# Patient Record
Sex: Female | Born: 1935 | Race: White | Hispanic: No | Marital: Single | Smoking: Never smoker
Health system: Southern US, Community
[De-identification: ages and names within clinical notes are randomized; demographics above are authoritative.]

## PROBLEM LIST (undated history)

## (undated) DIAGNOSIS — D509 Iron deficiency anemia, unspecified: Secondary | ICD-10-CM

## (undated) DIAGNOSIS — M810 Age-related osteoporosis without current pathological fracture: Secondary | ICD-10-CM

## (undated) DIAGNOSIS — I4891 Unspecified atrial fibrillation: Secondary | ICD-10-CM

## (undated) DIAGNOSIS — H353230 Exudative age-related macular degeneration, bilateral, stage unspecified: Secondary | ICD-10-CM

## (undated) DIAGNOSIS — F32A Depression, unspecified: Secondary | ICD-10-CM

## (undated) DIAGNOSIS — E039 Hypothyroidism, unspecified: Secondary | ICD-10-CM

## (undated) DIAGNOSIS — I639 Cerebral infarction, unspecified: Secondary | ICD-10-CM

## (undated) DIAGNOSIS — Z950 Presence of cardiac pacemaker: Secondary | ICD-10-CM

## (undated) DIAGNOSIS — K5900 Constipation, unspecified: Secondary | ICD-10-CM

## (undated) DIAGNOSIS — K219 Gastro-esophageal reflux disease without esophagitis: Secondary | ICD-10-CM

## (undated) DIAGNOSIS — I10 Essential (primary) hypertension: Secondary | ICD-10-CM

## (undated) DIAGNOSIS — I452 Bifascicular block: Secondary | ICD-10-CM

## (undated) DIAGNOSIS — M1990 Unspecified osteoarthritis, unspecified site: Secondary | ICD-10-CM

## (undated) DIAGNOSIS — R011 Cardiac murmur, unspecified: Secondary | ICD-10-CM

## (undated) DIAGNOSIS — F329 Major depressive disorder, single episode, unspecified: Secondary | ICD-10-CM

## (undated) DIAGNOSIS — F419 Anxiety disorder, unspecified: Secondary | ICD-10-CM

## (undated) DIAGNOSIS — J189 Pneumonia, unspecified organism: Secondary | ICD-10-CM

## (undated) HISTORY — PX: APPENDECTOMY: SHX54

## (undated) HISTORY — PX: TOTAL ABDOMINAL HYSTERECTOMY: SHX209

## (undated) HISTORY — PX: CATARACT EXTRACTION W/ INTRAOCULAR LENS  IMPLANT, BILATERAL: SHX1307

## (undated) HISTORY — PX: JOINT REPLACEMENT: SHX530

## (undated) HISTORY — DX: Iron deficiency anemia, unspecified: D50.9

## (undated) HISTORY — PX: NASAL SEPTUM SURGERY: SHX37

## (undated) HISTORY — PX: CHOLECYSTECTOMY OPEN: SUR202

## (undated) HISTORY — PX: LAPAROSCOPIC OVARIAN CYSTECTOMY: SHX6248

## (undated) HISTORY — DX: Unspecified atrial fibrillation: I48.91

## (undated) HISTORY — PX: EYE SURGERY: SHX253

## (undated) HISTORY — DX: Essential (primary) hypertension: I10

## (undated) HISTORY — DX: Major depressive disorder, single episode, unspecified: F32.9

## (undated) HISTORY — PX: FRACTURE SURGERY: SHX138

## (undated) HISTORY — DX: Hypothyroidism, unspecified: E03.9

## (undated) HISTORY — DX: Gastro-esophageal reflux disease without esophagitis: K21.9

## (undated) HISTORY — DX: Age-related osteoporosis without current pathological fracture: M81.0

## (undated) HISTORY — DX: Anxiety disorder, unspecified: F41.9

## (undated) HISTORY — PX: COLONOSCOPY: SHX174

## (undated) HISTORY — PX: TONSILLECTOMY: SUR1361

## (undated) HISTORY — DX: Constipation, unspecified: K59.00

## (undated) HISTORY — DX: Depression, unspecified: F32.A

---

## 1998-01-02 ENCOUNTER — Encounter: Admission: RE | Admit: 1998-01-02 | Discharge: 1998-04-02 | Payer: Self-pay | Admitting: Neurosurgery

## 1998-05-14 ENCOUNTER — Encounter: Admission: RE | Admit: 1998-05-14 | Discharge: 1998-08-12 | Payer: Self-pay | Admitting: Internal Medicine

## 2000-07-20 HISTORY — PX: SVT ABLATION: EP1225

## 2000-09-22 ENCOUNTER — Ambulatory Visit (HOSPITAL_COMMUNITY): Admission: RE | Admit: 2000-09-22 | Discharge: 2000-09-23 | Payer: Self-pay | Admitting: Internal Medicine

## 2001-07-20 DIAGNOSIS — I639 Cerebral infarction, unspecified: Secondary | ICD-10-CM

## 2001-07-20 HISTORY — DX: Cerebral infarction, unspecified: I63.9

## 2001-12-02 ENCOUNTER — Encounter: Admission: RE | Admit: 2001-12-02 | Discharge: 2001-12-02 | Payer: Self-pay | Admitting: Internal Medicine

## 2001-12-02 ENCOUNTER — Encounter: Payer: Self-pay | Admitting: Internal Medicine

## 2001-12-23 ENCOUNTER — Encounter: Admission: RE | Admit: 2001-12-23 | Discharge: 2002-03-23 | Payer: Self-pay | Admitting: Neurosurgery

## 2002-01-23 ENCOUNTER — Encounter: Admission: RE | Admit: 2002-01-23 | Discharge: 2002-01-23 | Payer: Self-pay | Admitting: Neurosurgery

## 2002-01-23 ENCOUNTER — Encounter: Payer: Self-pay | Admitting: Neurosurgery

## 2002-06-08 ENCOUNTER — Encounter: Admission: RE | Admit: 2002-06-08 | Discharge: 2002-06-08 | Payer: Self-pay | Admitting: Internal Medicine

## 2002-06-08 ENCOUNTER — Encounter: Payer: Self-pay | Admitting: Internal Medicine

## 2002-06-24 ENCOUNTER — Inpatient Hospital Stay (HOSPITAL_COMMUNITY): Admission: EM | Admit: 2002-06-24 | Discharge: 2002-06-30 | Payer: Self-pay | Admitting: Emergency Medicine

## 2002-06-24 ENCOUNTER — Encounter: Payer: Self-pay | Admitting: Emergency Medicine

## 2002-06-25 ENCOUNTER — Encounter: Payer: Self-pay | Admitting: Internal Medicine

## 2002-06-26 ENCOUNTER — Encounter: Payer: Self-pay | Admitting: Cardiology

## 2003-01-10 ENCOUNTER — Encounter: Admission: RE | Admit: 2003-01-10 | Discharge: 2003-04-10 | Payer: Self-pay | Admitting: Internal Medicine

## 2003-09-07 ENCOUNTER — Encounter: Admission: RE | Admit: 2003-09-07 | Discharge: 2003-09-07 | Payer: Self-pay | Admitting: Internal Medicine

## 2004-04-02 ENCOUNTER — Encounter: Admission: RE | Admit: 2004-04-02 | Discharge: 2004-04-02 | Payer: Self-pay | Admitting: Internal Medicine

## 2004-06-06 ENCOUNTER — Ambulatory Visit: Payer: Self-pay | Admitting: Internal Medicine

## 2004-07-02 ENCOUNTER — Ambulatory Visit: Payer: Self-pay | Admitting: Internal Medicine

## 2004-07-23 ENCOUNTER — Ambulatory Visit: Payer: Self-pay | Admitting: Internal Medicine

## 2004-08-20 ENCOUNTER — Ambulatory Visit: Payer: Self-pay | Admitting: Internal Medicine

## 2004-09-18 ENCOUNTER — Ambulatory Visit: Payer: Self-pay | Admitting: Internal Medicine

## 2004-10-07 ENCOUNTER — Ambulatory Visit: Payer: Self-pay | Admitting: Internal Medicine

## 2004-10-28 ENCOUNTER — Ambulatory Visit: Payer: Self-pay | Admitting: Internal Medicine

## 2004-11-25 ENCOUNTER — Ambulatory Visit: Payer: Self-pay | Admitting: Internal Medicine

## 2004-12-26 ENCOUNTER — Ambulatory Visit: Payer: Self-pay | Admitting: Internal Medicine

## 2005-01-01 ENCOUNTER — Ambulatory Visit: Payer: Self-pay | Admitting: Internal Medicine

## 2005-01-08 ENCOUNTER — Ambulatory Visit: Payer: Self-pay | Admitting: Internal Medicine

## 2005-01-28 ENCOUNTER — Ambulatory Visit: Payer: Self-pay | Admitting: Internal Medicine

## 2005-02-27 ENCOUNTER — Ambulatory Visit: Payer: Self-pay | Admitting: Internal Medicine

## 2005-03-31 ENCOUNTER — Ambulatory Visit: Payer: Self-pay | Admitting: Internal Medicine

## 2005-04-17 ENCOUNTER — Ambulatory Visit: Payer: Self-pay | Admitting: Internal Medicine

## 2005-05-15 ENCOUNTER — Ambulatory Visit: Payer: Self-pay | Admitting: Internal Medicine

## 2005-06-18 ENCOUNTER — Ambulatory Visit: Payer: Self-pay | Admitting: Internal Medicine

## 2005-06-29 ENCOUNTER — Ambulatory Visit: Payer: Self-pay | Admitting: Internal Medicine

## 2005-07-04 ENCOUNTER — Encounter: Admission: RE | Admit: 2005-07-04 | Discharge: 2005-07-04 | Payer: Self-pay | Admitting: Internal Medicine

## 2005-07-29 ENCOUNTER — Ambulatory Visit: Payer: Self-pay | Admitting: Family Medicine

## 2005-07-31 ENCOUNTER — Ambulatory Visit: Payer: Self-pay | Admitting: Internal Medicine

## 2005-08-03 ENCOUNTER — Ambulatory Visit: Payer: Self-pay | Admitting: Internal Medicine

## 2005-08-18 ENCOUNTER — Ambulatory Visit: Payer: Self-pay | Admitting: Internal Medicine

## 2005-09-10 ENCOUNTER — Ambulatory Visit: Payer: Self-pay | Admitting: Internal Medicine

## 2005-09-24 ENCOUNTER — Ambulatory Visit: Payer: Self-pay | Admitting: Internal Medicine

## 2005-10-26 ENCOUNTER — Ambulatory Visit: Payer: Self-pay | Admitting: Internal Medicine

## 2005-11-25 ENCOUNTER — Ambulatory Visit: Payer: Self-pay | Admitting: Internal Medicine

## 2005-12-29 ENCOUNTER — Ambulatory Visit: Payer: Self-pay | Admitting: Internal Medicine

## 2006-01-29 ENCOUNTER — Ambulatory Visit: Payer: Self-pay | Admitting: Internal Medicine

## 2006-03-01 ENCOUNTER — Ambulatory Visit: Payer: Self-pay | Admitting: Internal Medicine

## 2006-04-01 ENCOUNTER — Ambulatory Visit: Payer: Self-pay | Admitting: Internal Medicine

## 2006-04-09 ENCOUNTER — Ambulatory Visit: Payer: Self-pay | Admitting: Internal Medicine

## 2006-04-14 ENCOUNTER — Ambulatory Visit: Payer: Self-pay | Admitting: Internal Medicine

## 2006-04-15 ENCOUNTER — Ambulatory Visit: Payer: Self-pay | Admitting: Cardiology

## 2006-04-30 ENCOUNTER — Ambulatory Visit: Payer: Self-pay | Admitting: Internal Medicine

## 2006-06-22 ENCOUNTER — Ambulatory Visit: Payer: Self-pay | Admitting: Internal Medicine

## 2006-07-20 HISTORY — PX: VITRECTOMY: SHX106

## 2006-07-23 ENCOUNTER — Ambulatory Visit: Payer: Self-pay | Admitting: Internal Medicine

## 2006-08-27 ENCOUNTER — Ambulatory Visit: Payer: Self-pay | Admitting: Internal Medicine

## 2006-09-21 ENCOUNTER — Ambulatory Visit: Payer: Self-pay | Admitting: Internal Medicine

## 2006-10-27 ENCOUNTER — Ambulatory Visit: Payer: Self-pay | Admitting: Internal Medicine

## 2006-11-11 ENCOUNTER — Ambulatory Visit: Payer: Self-pay | Admitting: Internal Medicine

## 2006-12-07 ENCOUNTER — Ambulatory Visit: Payer: Self-pay | Admitting: Internal Medicine

## 2007-01-07 ENCOUNTER — Ambulatory Visit: Payer: Self-pay | Admitting: Internal Medicine

## 2007-01-11 DIAGNOSIS — M1990 Unspecified osteoarthritis, unspecified site: Secondary | ICD-10-CM | POA: Insufficient documentation

## 2007-01-11 DIAGNOSIS — M810 Age-related osteoporosis without current pathological fracture: Secondary | ICD-10-CM

## 2007-01-11 DIAGNOSIS — F329 Major depressive disorder, single episode, unspecified: Secondary | ICD-10-CM

## 2007-01-11 DIAGNOSIS — F32A Depression, unspecified: Secondary | ICD-10-CM | POA: Insufficient documentation

## 2007-01-11 DIAGNOSIS — I4891 Unspecified atrial fibrillation: Secondary | ICD-10-CM

## 2007-01-11 DIAGNOSIS — K219 Gastro-esophageal reflux disease without esophagitis: Secondary | ICD-10-CM | POA: Insufficient documentation

## 2007-01-11 DIAGNOSIS — J309 Allergic rhinitis, unspecified: Secondary | ICD-10-CM | POA: Insufficient documentation

## 2007-01-11 HISTORY — DX: Age-related osteoporosis without current pathological fracture: M81.0

## 2007-01-31 ENCOUNTER — Ambulatory Visit (HOSPITAL_COMMUNITY): Admission: RE | Admit: 2007-01-31 | Discharge: 2007-01-31 | Payer: Self-pay | Admitting: Ophthalmology

## 2007-02-04 ENCOUNTER — Ambulatory Visit: Payer: Self-pay | Admitting: Internal Medicine

## 2007-03-04 ENCOUNTER — Ambulatory Visit: Payer: Self-pay | Admitting: Internal Medicine

## 2007-03-04 LAB — CONVERTED CEMR LAB: Prothrombin Time: 19.3 s — ABNORMAL HIGH (ref 10.0–14.0)

## 2007-03-09 ENCOUNTER — Telehealth: Payer: Self-pay | Admitting: *Deleted

## 2007-03-31 ENCOUNTER — Ambulatory Visit: Payer: Self-pay | Admitting: Internal Medicine

## 2007-03-31 DIAGNOSIS — R10813 Right lower quadrant abdominal tenderness: Secondary | ICD-10-CM

## 2007-03-31 LAB — CONVERTED CEMR LAB
AST: 19 units/L (ref 0–37)
Basophils Relative: 0.5 % (ref 0.0–1.0)
Bilirubin, Direct: 0.1 mg/dL (ref 0.0–0.3)
Eosinophils Absolute: 0.3 10*3/uL (ref 0.0–0.6)
Hemoglobin: 13.8 g/dL (ref 12.0–15.0)
Lymphocytes Relative: 22.9 % (ref 12.0–46.0)
MCV: 94.1 fL (ref 78.0–100.0)
Monocytes Relative: 7.5 % (ref 3.0–11.0)
Neutrophils Relative %: 64.1 % (ref 43.0–77.0)
RBC: 4.27 M/uL (ref 3.87–5.11)
RDW: 12 % (ref 11.5–14.6)
Total Bilirubin: 0.7 mg/dL (ref 0.3–1.2)
WBC: 6.5 10*3/uL (ref 4.5–10.5)

## 2007-04-08 ENCOUNTER — Telehealth (INDEPENDENT_AMBULATORY_CARE_PROVIDER_SITE_OTHER): Payer: Self-pay | Admitting: *Deleted

## 2007-04-29 ENCOUNTER — Ambulatory Visit: Payer: Self-pay | Admitting: Internal Medicine

## 2007-06-03 ENCOUNTER — Ambulatory Visit: Payer: Self-pay | Admitting: Internal Medicine

## 2007-07-06 ENCOUNTER — Ambulatory Visit: Payer: Self-pay | Admitting: Internal Medicine

## 2007-08-04 ENCOUNTER — Ambulatory Visit: Payer: Self-pay | Admitting: Internal Medicine

## 2007-08-04 DIAGNOSIS — K5900 Constipation, unspecified: Secondary | ICD-10-CM | POA: Insufficient documentation

## 2007-08-04 HISTORY — DX: Constipation, unspecified: K59.00

## 2007-09-05 ENCOUNTER — Ambulatory Visit: Payer: Self-pay | Admitting: Internal Medicine

## 2007-09-05 LAB — CONVERTED CEMR LAB: INR: 1.6

## 2007-10-07 ENCOUNTER — Ambulatory Visit: Payer: Self-pay | Admitting: Internal Medicine

## 2007-11-09 ENCOUNTER — Ambulatory Visit: Payer: Self-pay | Admitting: Internal Medicine

## 2007-11-15 ENCOUNTER — Telehealth: Payer: Self-pay | Admitting: Internal Medicine

## 2007-12-09 ENCOUNTER — Ambulatory Visit: Payer: Self-pay | Admitting: Internal Medicine

## 2007-12-09 LAB — CONVERTED CEMR LAB
INR: 2.1
Prothrombin Time: 17.7 s

## 2008-01-06 ENCOUNTER — Ambulatory Visit: Payer: Self-pay | Admitting: Internal Medicine

## 2008-01-17 ENCOUNTER — Telehealth: Payer: Self-pay | Admitting: Internal Medicine

## 2008-02-10 ENCOUNTER — Ambulatory Visit: Payer: Self-pay | Admitting: Internal Medicine

## 2008-02-10 DIAGNOSIS — L659 Nonscarring hair loss, unspecified: Secondary | ICD-10-CM | POA: Insufficient documentation

## 2008-02-10 LAB — CONVERTED CEMR LAB: TSH: 2.31 microintl units/mL (ref 0.35–5.50)

## 2008-03-22 ENCOUNTER — Ambulatory Visit: Payer: Self-pay | Admitting: Internal Medicine

## 2008-05-08 ENCOUNTER — Ambulatory Visit: Payer: Self-pay | Admitting: Internal Medicine

## 2008-05-08 LAB — CONVERTED CEMR LAB: INR: 2.1

## 2008-06-12 ENCOUNTER — Ambulatory Visit: Payer: Self-pay | Admitting: Internal Medicine

## 2008-06-12 LAB — CONVERTED CEMR LAB: INR: 1.3

## 2008-07-03 ENCOUNTER — Ambulatory Visit: Payer: Self-pay | Admitting: Internal Medicine

## 2008-07-03 LAB — CONVERTED CEMR LAB: INR: 2.1

## 2008-08-08 ENCOUNTER — Ambulatory Visit: Payer: Self-pay | Admitting: Internal Medicine

## 2008-08-08 DIAGNOSIS — M549 Dorsalgia, unspecified: Secondary | ICD-10-CM | POA: Insufficient documentation

## 2008-08-08 LAB — CONVERTED CEMR LAB: Prothrombin Time: 23.7 s

## 2008-09-06 ENCOUNTER — Ambulatory Visit: Payer: Self-pay | Admitting: Internal Medicine

## 2008-09-06 LAB — CONVERTED CEMR LAB
INR: 3.1
Prothrombin Time: 21.1 s

## 2008-10-04 ENCOUNTER — Ambulatory Visit: Payer: Self-pay | Admitting: Internal Medicine

## 2008-10-04 LAB — CONVERTED CEMR LAB
INR: 2.6
Prothrombin Time: 19.7 s

## 2008-11-07 ENCOUNTER — Ambulatory Visit: Payer: Self-pay | Admitting: Internal Medicine

## 2008-11-07 LAB — CONVERTED CEMR LAB: Prothrombin Time: 17.1 s

## 2008-12-18 ENCOUNTER — Ambulatory Visit: Payer: Self-pay | Admitting: Internal Medicine

## 2008-12-18 LAB — CONVERTED CEMR LAB: INR: 2.3

## 2009-01-15 ENCOUNTER — Ambulatory Visit: Payer: Self-pay | Admitting: Internal Medicine

## 2009-01-22 ENCOUNTER — Telehealth: Payer: Self-pay | Admitting: Internal Medicine

## 2009-02-15 ENCOUNTER — Ambulatory Visit: Payer: Self-pay | Admitting: Internal Medicine

## 2009-02-15 LAB — CONVERTED CEMR LAB: INR: 2.2

## 2009-03-04 ENCOUNTER — Ambulatory Visit: Payer: Self-pay | Admitting: Internal Medicine

## 2009-03-04 DIAGNOSIS — M353 Polymyalgia rheumatica: Secondary | ICD-10-CM | POA: Insufficient documentation

## 2009-03-04 LAB — CONVERTED CEMR LAB
CRP, High Sensitivity: 2 (ref 0.00–5.00)
Rhuematoid fact SerPl-aCnc: 20 intl units/mL (ref 0.0–20.0)

## 2009-03-15 ENCOUNTER — Ambulatory Visit: Payer: Self-pay | Admitting: Internal Medicine

## 2009-04-08 ENCOUNTER — Ambulatory Visit: Payer: Self-pay | Admitting: Internal Medicine

## 2009-05-06 ENCOUNTER — Ambulatory Visit: Payer: Self-pay | Admitting: Internal Medicine

## 2009-05-06 LAB — CONVERTED CEMR LAB
INR: 2.5
Prothrombin Time: 19.3 s

## 2009-06-05 ENCOUNTER — Ambulatory Visit: Payer: Self-pay | Admitting: Internal Medicine

## 2009-08-01 ENCOUNTER — Ambulatory Visit: Payer: Self-pay | Admitting: Internal Medicine

## 2009-08-01 LAB — CONVERTED CEMR LAB: Prothrombin Time: 16.8 s

## 2009-09-10 ENCOUNTER — Ambulatory Visit: Payer: Self-pay | Admitting: Internal Medicine

## 2009-09-10 LAB — CONVERTED CEMR LAB
Basophils Relative: 0.7 % (ref 0.0–3.0)
Bilirubin, Direct: 0.1 mg/dL (ref 0.0–0.3)
Hemoglobin: 12.2 g/dL (ref 12.0–15.0)
INR: 2.9
Lymphs Abs: 0.9 10*3/uL (ref 0.7–4.0)
MCHC: 33.4 g/dL (ref 30.0–36.0)
MCV: 98.2 fL (ref 78.0–100.0)
Monocytes Absolute: 0.4 10*3/uL (ref 0.1–1.0)
Monocytes Relative: 8.1 % (ref 3.0–12.0)
Neutrophils Relative %: 66.7 % (ref 43.0–77.0)
Platelets: 152 10*3/uL (ref 150.0–400.0)
RBC: 3.74 M/uL — ABNORMAL LOW (ref 3.87–5.11)
Sed Rate: 26 mm/hr — ABNORMAL HIGH (ref 0–22)
Total Bilirubin: 0.4 mg/dL (ref 0.3–1.2)
Vit D, 25-Hydroxy: 45 ng/mL (ref 30–89)
WBC: 5 10*3/uL (ref 4.5–10.5)

## 2009-10-08 ENCOUNTER — Ambulatory Visit: Payer: Self-pay | Admitting: Internal Medicine

## 2009-11-05 ENCOUNTER — Ambulatory Visit: Payer: Self-pay | Admitting: Internal Medicine

## 2009-11-05 LAB — CONVERTED CEMR LAB: INR: 1.9

## 2010-01-07 ENCOUNTER — Ambulatory Visit: Payer: Self-pay | Admitting: Internal Medicine

## 2010-01-07 LAB — CONVERTED CEMR LAB: INR: 1.6

## 2010-02-04 ENCOUNTER — Telehealth: Payer: Self-pay | Admitting: Internal Medicine

## 2010-02-04 ENCOUNTER — Ambulatory Visit: Payer: Self-pay | Admitting: Internal Medicine

## 2010-02-10 ENCOUNTER — Ambulatory Visit: Payer: Self-pay | Admitting: Family Medicine

## 2010-02-10 DIAGNOSIS — L255 Unspecified contact dermatitis due to plants, except food: Secondary | ICD-10-CM | POA: Insufficient documentation

## 2010-03-04 ENCOUNTER — Ambulatory Visit: Payer: Self-pay | Admitting: Internal Medicine

## 2010-04-03 ENCOUNTER — Ambulatory Visit: Payer: Self-pay | Admitting: Internal Medicine

## 2010-04-08 ENCOUNTER — Telehealth: Payer: Self-pay | Admitting: Internal Medicine

## 2010-05-01 ENCOUNTER — Ambulatory Visit: Payer: Self-pay | Admitting: Internal Medicine

## 2010-06-02 ENCOUNTER — Ambulatory Visit: Payer: Self-pay | Admitting: Internal Medicine

## 2010-07-02 ENCOUNTER — Ambulatory Visit: Payer: Self-pay | Admitting: Internal Medicine

## 2010-07-02 DIAGNOSIS — M19079 Primary osteoarthritis, unspecified ankle and foot: Secondary | ICD-10-CM | POA: Insufficient documentation

## 2010-07-02 LAB — CONVERTED CEMR LAB
Basophils Absolute: 0 10*3/uL (ref 0.0–0.1)
CRP, High Sensitivity: 3.8 (ref 0.00–5.00)
Eosinophils Absolute: 0.4 10*3/uL (ref 0.0–0.7)
Eosinophils Relative: 6.2 % — ABNORMAL HIGH (ref 0.0–5.0)
Hemoglobin: 12 g/dL (ref 12.0–15.0)
Lymphs Abs: 1.2 10*3/uL (ref 0.7–4.0)
MCHC: 34.3 g/dL (ref 30.0–36.0)
Monocytes Absolute: 0.6 10*3/uL (ref 0.1–1.0)
RBC: 3.64 M/uL — ABNORMAL LOW (ref 3.87–5.11)

## 2010-07-30 ENCOUNTER — Ambulatory Visit
Admission: RE | Admit: 2010-07-30 | Discharge: 2010-07-30 | Payer: Self-pay | Source: Home / Self Care | Attending: Internal Medicine | Admitting: Internal Medicine

## 2010-07-30 LAB — CONVERTED CEMR LAB: INR: 3.6

## 2010-08-02 DIAGNOSIS — I4891 Unspecified atrial fibrillation: Secondary | ICD-10-CM

## 2010-08-19 NOTE — Progress Notes (Signed)
Summary: Pt cx her orthopedic appt,because not network with her insurance  Phone Note Call from Patient Call back at [facility] Phone [phone]   Caller: Patient Summary of Call: Pt called and said that she cancelled her orthopedic appt with [facility] (Dr. Margaretha Sheffield) because they are not in network with her [facility].   Initial call taken by: Lucy Antigua,  February 04, 2010 1:46 PM  Follow-up for Phone Call        n medicare- pt informd and instructed to call her insurance compnay find out what is in network and we will be glad to make that appointment Follow-up by: Willy Eddy, LPN,  February 04, 2010 2:09 PM

## 2010-08-19 NOTE — Assessment & Plan Note (Signed)
Summary: 4 month fup//ccm   Vital Signs:  Patient profile:   77 year old female Height:      65 inches Weight:      131 pounds BMI:     21.88 Temp:     98.2 degrees F oral Pulse rate:   72 / minute Resp:     14 per minute BP sitting:   124 / 72  (left arm)  Vitals Entered By: Willy Eddy, LPN (January 07, 2010 1:21 PM) CC: roa   CC:  roa.  History of Present Illness: shoulder pain with reacing and cannot lay in that side can reach pack but feels discpomfort has not had any injections tylenol arthritis works for pain control hx of OA in back on coumadin for AF and needs a protime today with adjustment  Preventive Screening-Counseling & Management  Alcohol-Tobacco     Smoking Status: never  Problems Prior to Update: 1)  Rotator Cuff Syndrome, Left  (ICD-726.10) 2)  Polymyalgia Rheumatica  (ICD-725) 3)  Back Pain, Chronic  (ICD-724.5) 4)  Hair Loss  (ICD-704.00) 5)  Constipation, Intermittent  (ICD-564.00) 6)  Symptom, Tenderness, Abdominal Right Lwr Quad  (ICD-789.63) 7)  Aftercare, Long-term Use, Anticoagulants  (ICD-V58.61) 8)  Encounter For Therapeutic Drug Monitoring  (ICD-V58.83) 9)  Osteoporosis  (ICD-733.00) 10)  Depression  (ICD-311) 11)  Allergic Rhinitis  (ICD-477.9) 12)  Degenerative Joint Disease  (ICD-715.90) 13)  Gerd  (ICD-530.81) 14)  Atrial Fibrillation  (ICD-427.31)  Current Problems (verified): 1)  Polymyalgia Rheumatica  (ICD-725) 2)  Back Pain, Chronic  (ICD-724.5) 3)  Hair Loss  (ICD-704.00) 4)  Constipation, Intermittent  (ICD-564.00) 5)  Symptom, Tenderness, Abdominal Right Lwr Quad  (EAV-409.81) 6)  Aftercare, Long-term Use, Anticoagulants  (ICD-V58.61) 7)  Encounter For Therapeutic Drug Monitoring  (ICD-V58.83) 8)  Osteoporosis  (ICD-733.00) 9)  Depression  (ICD-311) 10)  Allergic Rhinitis  (ICD-477.9) 11)  Degenerative Joint Disease  (ICD-715.90) 12)  Gerd  (ICD-530.81) 13)  Atrial Fibrillation  (ICD-427.31)  Medications  Prior to Update: 1)  Atenolol 25 Mg  Tabs (Atenolol) .... Once Daily 2)  Lorazepam 1 Mg  Tabs (Lorazepam) .... 1/2 in Am and 1 At Bedtime 3)  Estrace 1 Mg Tabs (Estradiol) .Marland Kitchen.. 1 Once Daily 4)  Fish Oil   Caps (Omega-3 Fatty Acids Caps) .... Once Daily 5)  Warfarin Sodium 1 Mg Tabs (Warfarin Sodium) .... 3 Tabs Once Daily Exceot 5 On 7th Day 6)  Icaps Mv   Tabs (Multiple Vitamins-Minerals) .... Once Daily 7)  Kapidex 60 Mg  Cpdr (Dexlansoprazole) .Marland Kitchen.. 1 Once Daily 8)  Vitamin D 1000 Unit Caps (Cholecalciferol) .Marland Kitchen.. 1 Once Daily 9)  Etodolac 300 Mg Caps (Etodolac) .... One By Mouth Two Times A Day With Food 10)  Centrum Silver Ultra Womens  Tabs (Multiple Vitamins-Minerals) .Marland Kitchen.. 1 Once Daily  Current Medications (verified): 1)  Atenolol 25 Mg  Tabs (Atenolol) .... Once Daily 2)  Lorazepam 1 Mg  Tabs (Lorazepam) .... 1/2 in Am and 1 At Bedtime 3)  Estrace 1 Mg Tabs (Estradiol) .Marland Kitchen.. 1 Once Daily 4)  Fish Oil   Caps (Omega-3 Fatty Acids Caps) .... Once Daily 5)  Warfarin Sodium 1 Mg Tabs (Warfarin Sodium) .... 3 Tabs Once Daily 6)  Icaps Mv   Tabs (Multiple Vitamins-Minerals) .Marland Kitchen.. 1 Two Times A Day 7)  Kapidex 60 Mg  Cpdr (Dexlansoprazole) .Marland Kitchen.. 1 Once Daily 8)  Vitamin D 1000 Unit Caps (Cholecalciferol) .Marland Kitchen.. 1 Once Daily 9)  Etodolac  300 Mg Caps (Etodolac) .... One By Mouth Two Times A Day With Food 10)  Centrum Silver Ultra Womens  Tabs (Multiple Vitamins-Minerals) .Marland Kitchen.. 1 Once Daily  Allergies (verified): 1)  ! Pcn 2)  ! Allegra 3)  ! Celebrex (Celecoxib) 4)  Darvocet-N 100  Past History:  Family History: Last updated: 01/11/2007 Family History of Cardiovascular disorder  Social History: Last updated: 08/04/2007 Retired Married Never Smoked  Risk Factors: Smoking Status: never (01/07/2010)  Past medical, surgical, family and social histories (including risk factors) reviewed for relevance to current acute and chronic problems.  Past Medical History: Reviewed history from  01/11/2007 and no changes required. Atrial fibrillation GERD Allergic rhinitis Depression Osteoporosis Menopause  Past Surgical History: Reviewed history from 01/11/2007 and no changes required. TAH Cholecystectomy Hysterectomy  Family History: Reviewed history from 01/11/2007 and no changes required. Family History of Cardiovascular disorder  Social History: Reviewed history from 08/04/2007 and no changes required. Retired Married Never Smoked  Review of Systems       The patient complains of hoarseness and peripheral edema.  The patient denies anorexia, fever, weight loss, weight gain, vision loss, decreased hearing, chest pain, syncope, dyspnea on exertion, prolonged cough, headaches, hemoptysis, abdominal pain, melena, hematochezia, severe indigestion/heartburn, hematuria, incontinence, genital sores, muscle weakness, suspicious skin lesions, transient blindness, difficulty walking, depression, unusual weight change, abnormal bleeding, enlarged lymph nodes, angioedema, and breast masses.         arthritic pain  Physical Exam  General:  alert and well-hydrated.   Head:  normocephalic and no abnormalities observed.   Eyes:  pupils equal and pupils reactive to light.   Ears:  R ear normal and L ear normal.   Nose:  no external deformity and no nasal discharge.   Neck:  No deformities, masses, or tenderness noted. Lungs:  normal respiratory effort and no intercostal retractions.   Heart:  normal rate and regular rhythm.   Abdomen:  soft, no guarding, and no rigidity.   Msk:  decreased ROM and joint tenderness.   Extremities:  trace left pedal edema and trace right pedal edema.   Neurologic:  alert & oriented X3 and finger-to-nose normal.     Shoulder/Elbow Exam  General:    Well-developed, well-nourished, normal body habitus; no deformities, normal grooming.    Skin:    Intact, no scars, lesions, rashes, cafe au lait spots or bruising.    Inspection:     Inspection is normal.    Palpation:    tenderness R-AC:   Vascular:    Radial, ulnar, brachial, and axillary pulses 2+ and symmetric; capillary refill less than 2 seconds; no evidence of ischemia, clubbing, or cyanosis.     Impression & Recommendations:  Problem # 1:  ROTATOR CUFF SYNDROME, LEFT (ICD-726.10)  Informed consen obtained and then the left shoulderjoint was prepped in a sterile manor and 40 mg depo and 1/2 cc 1% lidocaine injected into the synovial space. After care discussed. Pt tolerated procedure well.  Orders: Joint Aspirate / Injection, Large (20610) Depo- Medrol 40mg  (J1030)  Problem # 2:  ATRIAL FIBRILLATION (ICD-427.31)  due protime today check and adjust as needed Her updated medication list for this problem includes:    Atenolol 25 Mg Tabs (Atenolol) ..... Once daily    Warfarin Sodium 1 Mg Tabs (Warfarin sodium) .Marland KitchenMarland KitchenMarland KitchenMarland Kitchen 3 tabs once daily  Orders: Fingerstick (81191) Protime ([id number])  Reviewed the following: PT: 17.1 (11/05/2009)   INR: 1.9 (11/05/2009) Next Protime: 4 weeks (dated on 11/05/2009)  Complete Medication List: 1)  Atenolol 25 Mg Tabs (Atenolol) .... Once daily 2)  Lorazepam 1 Mg Tabs (Lorazepam) .... 1/2 in am and 1 at bedtime 3)  Estrace 1 Mg Tabs (Estradiol) .Marland Kitchen.. 1 once daily 4)  Fish Oil Caps (Omega-3 fatty acids caps) .... Once daily 5)  Warfarin Sodium 1 Mg Tabs (Warfarin sodium) .... 3 tabs once daily 6)  Icaps Mv Tabs (Multiple vitamins-minerals) .Marland Kitchen.. 1 two times a day 7)  Kapidex 60 Mg Cpdr (Dexlansoprazole) .Marland Kitchen.. 1 once daily 8)  Vitamin D 1000 Unit Caps (Cholecalciferol) .Marland Kitchen.. 1 once daily 9)  Etodolac 300 Mg Caps (Etodolac) .... One by mouth two times a day with food 10)  Centrum Silver Ultra Womens Tabs (Multiple vitamins-minerals) .Marland Kitchen.. 1 once daily  Patient Instructions: 1)  Please schedule a follow-up appointment in 3 months.    Laboratory Results   Blood Tests   Date/Time Recieved: January 07, 2010 2:08  PM  Date/Time Reported: January 07, 2010 2:08 PM    INR: 1.6   (Normal Range: 0.88-1.12   Therap INR: 2.0-3.5) Comments: Wynona Canes, CMA  January 07, 2010 2:08 PM       ANTICOAGULATION RECORD PREVIOUS REGIMEN & LAB RESULTS Anticoagulation Diagnosis:  v58.83,v58.61,427.31 on  03/31/2007 Previous INR Goal Range:  2.0-3.0 on  01/06/2008 Previous INR:  1.9 on  11/05/2009 Previous Coumadin Dose(mg):  3mg  qd on  09/06/2008 Previous Regimen:  same on  02/15/2009 Previous Coagulation Comments:  hold one day the resume the 3 mg a day on  08/08/2008  NEW REGIMEN & LAB RESULTS Current INR: 1.6 Regimen: same  (no change)       Repeat testing in: 4 weeks MEDICATIONS ATENOLOL 25 MG  TABS (ATENOLOL) once daily LORAZEPAM 1 MG  TABS (LORAZEPAM) 1/2 in am and 1 at bedtime ESTRACE 1 MG TABS (ESTRADIOL) 1 once daily FISH OIL   CAPS (OMEGA-3 FATTY ACIDS CAPS) once daily WARFARIN SODIUM 1 MG TABS (WARFARIN SODIUM) 3 tabs once daily ICAPS MV   TABS (MULTIPLE VITAMINS-MINERALS) 1 two times a day KAPIDEX 60 MG  CPDR (DEXLANSOPRAZOLE) 1 once daily VITAMIN D 1000 UNIT CAPS (CHOLECALCIFEROL) 1 once daily ETODOLAC 300 MG CAPS (ETODOLAC) one by mouth two times a day with food CENTRUM SILVER ULTRA WOMENS  TABS (MULTIPLE VITAMINS-MINERALS) 1 once daily   Anticoagulation Visit Questionnaire      Coumadin dose missed/changed:  No      Abnormal Bleeding Symptoms:  No   Any diet changes including alcohol intake, vegetables or greens since the last visit:  No Any illnesses or hospitalizations since the last visit:  No Any signs of clotting since the last visit (including chest discomfort, dizziness, shortness of breath, arm tingling, slurred speech, swelling or redness in leg):  Yes

## 2010-08-19 NOTE — Progress Notes (Signed)
Summary: APPT SCHEDULED / INFO ONLY  Phone Note Call from Patient   Summary of Call: Dr Lovell Sheehan wanted pt set up for appt for evaluation of Rotator Cuff Syndrome (left).... Pt was scheduled with Eulah Pont / Alisa Graff... Pt will be seeing Dr Margaretha Sheffield on 7/20 at 10am.... Pt aware of same.  Initial call taken by: Debbra Riding,  February 04, 2010 10:37 AM

## 2010-08-19 NOTE — Assessment & Plan Note (Signed)
Summary: poison ivy on face/arms/legs and ankles/cjr   Vital Signs:  Patient profile:   77 year old female Weight:      138 pounds Temp:     97.5 degrees F oral BP sitting:   120 / 82  (left arm) Cuff size:   regular  Vitals Entered By: Kathrynn Speed CMA (February 10, 2010 1:54 PM) CC: poison ivy on face, legs & ankles x 5 days, src   History of Present Illness: Patient seen with pruritic slightly raised rash mostly arms and legs bilaterally. Onset last Thursday after doing a lot of yard work. [name] tried topical creams such as hydrocortisone without improvement. Denies any fever or chills. Symptoms especially severe at night  Current Medications (verified): 1)  Atenolol 25 Mg  Tabs (Atenolol) .... Once Daily 2)  Lorazepam 1 Mg  Tabs (Lorazepam) .... 1/2 in Am and 1 At Bedtime 3)  Estrace 1 Mg Tabs (Estradiol) .Marland Kitchen.. 1 Once Daily 4)  Fish Oil   Caps (Omega-3 Fatty Acids Caps) .... Once Daily 5)  Warfarin Sodium 1 Mg Tabs (Warfarin Sodium) .... 3 Tabs Once Daily 6)  Icaps Mv   Tabs (Multiple Vitamins-Minerals) .Marland Kitchen.. 1 Two Times A Day 7)  Kapidex 60 Mg  Cpdr (Dexlansoprazole) .Marland Kitchen.. 1 Once Daily 8)  Vitamin D 1000 Unit Caps (Cholecalciferol) .Marland Kitchen.. 1 Once Daily 9)  Etodolac 300 Mg Caps (Etodolac) .... One By Mouth Two Times A Day With Food 10)  Centrum Silver Ultra Womens  Tabs (Multiple Vitamins-Minerals) .Marland Kitchen.. 1 Once Daily  Allergies (verified): 1)  ! Pcn 2)  ! Allegra 3)  ! Celebrex (Celecoxib) 4)  Darvocet-N 100  Past History:  Past Medical History: Last updated: 01/11/2007 Atrial fibrillation GERD Allergic rhinitis Depression Osteoporosis Menopause  Physical Exam  General:  Well-developed,well-nourished,in no acute distress; alert,appropriate and cooperative throughout examination Skin:  patient has a rash which is erythematous slightly vesicular in places with linear distribution in several areas with scattered patches both legs and forearms minimal left facial involvement.   Nontender.   Impression & Recommendations:  Problem # 1:  RHUS DERMATITIS (ICD-692.6) discussed options.  Reviewed possible side effects of steroids. Her updated medication list for this problem includes:    Prednisone 10 Mg Tabs (Prednisone) .Marland Kitchen... Taper as follows:  6-5-4-4-4-3-3-2-2-1-1  Complete Medication List: 1)  Atenolol 25 Mg Tabs (Atenolol) .... Once daily 2)  Lorazepam 1 Mg Tabs (Lorazepam) .... 1/2 in am and 1 at bedtime 3)  Estrace 1 Mg Tabs (Estradiol) .Marland Kitchen.. 1 once daily 4)  Fish Oil Caps (Omega-3 fatty acids caps) .... Once daily 5)  Warfarin Sodium 1 Mg Tabs (Warfarin sodium) .... 3 tabs once daily 6)  Icaps Mv Tabs (Multiple vitamins-minerals) .Marland Kitchen.. 1 two times a day 7)  Kapidex 60 Mg Cpdr (Dexlansoprazole) .Marland Kitchen.. 1 once daily 8)  Vitamin D 1000 Unit Caps (Cholecalciferol) .Marland Kitchen.. 1 once daily 9)  Etodolac 300 Mg Caps (Etodolac) .... One by mouth two times a day with food 10)  Centrum Silver Ultra Womens Tabs (Multiple vitamins-minerals) .Marland Kitchen.. 1 once daily 11)  Prednisone 10 Mg Tabs (Prednisone) .... Taper as follows:  6-5-4-4-4-3-3-2-2-1-1  Patient Instructions: 1)  Hold Etodolac while taking prednisone Prescriptions: PREDNISONE 10 MG TABS (PREDNISONE) taper as follows:  6-5-4-4-4-3-3-2-2-1-1  #35 x 0   Entered and Authorized by:   Evelena Peat MD   Signed by:   Evelena Peat MD on 02/10/2010   Method used:   Electronically to  [facility]  [phone]* (retail)       [address] [place], [place]  [address]       Ph: [phone] or [phone]       Fax: [phone]   RxID:   [id number]

## 2010-08-19 NOTE — Progress Notes (Signed)
Summary: REFILL REQUEST  Phone Note Refill Request Message from:  Patient on April 08, 2010 8:27 AM  Refills Requested: Medication #1:  ETODOLAC 300 MG CAPS one by mouth two times a day with food   Notes: [facility].    Initial call taken by: Debbra Riding,  April 08, 2010 8:28 AM    Prescriptions: ETODOLAC 300 MG CAPS (ETODOLAC) one by mouth two times a day with food  #60.0 Capsule x 1   Entered by:   Willy Eddy, LPN   Authorized by:   Stacie Glaze MD   Signed by:   Willy Eddy, LPN on 40/98/1191   Method used:   Electronically to        [facility]  [phone]* (retail)       [address] [place], [place]  [address]       Ph: [phone] or [phone]       Fax: [phone]   RxID:   [id number]   Appended Document: REFILL REQUEST this was ok'd per dr Lovell Sheehan

## 2010-08-19 NOTE — Assessment & Plan Note (Signed)
Summary: pt/njr   Nurse Visit   Allergies: 1)  ! Pcn 2)  ! Allegra 3)  ! Celebrex (Celecoxib) 4)  Darvocet-N 100 Laboratory Results   Blood Tests      INR: 1.9   (Normal Range: 0.88-1.12   Therap INR: 2.0-3.5) Comments: Rita Ohara  May 01, 2010 2:16 PM     Orders Added: 1)  Est. Patient Level I [99211] 2)  Protime [[id number]]   ANTICOAGULATION RECORD PREVIOUS REGIMEN & LAB RESULTS Anticoagulation Diagnosis:  v58.83,v58.61,427.31 on  03/31/2007 Previous INR Goal Range:  2.0-3.0 on  01/06/2008 Previous INR:  1.8 on  04/03/2010 Previous Coumadin Dose(mg):  3mg  qd on  09/06/2008 Previous Regimen:  same on  03/04/2010 Previous Coagulation Comments:  OV on  04/03/2010  NEW REGIMEN & LAB RESULTS Current INR: 1.9 Regimen: same Coagulation Comments: Dr. Caryl Never approved Repeat testing in: 4 weeks  Anticoagulation Visit Questionnaire Coumadin dose missed/changed:  No Abnormal Bleeding Symptoms:  No  Any diet changes including alcohol intake, vegetables or greens since the last visit:  No Any illnesses or hospitalizations since the last visit:  No Any signs of clotting since the last visit (including chest discomfort, dizziness, shortness of breath, arm tingling, slurred speech, swelling or redness in leg):  No  MEDICATIONS ATENOLOL 25 MG  TABS (ATENOLOL) once daily LORAZEPAM 1 MG  TABS (LORAZEPAM) 1/2 in am and 1 at bedtime ESTRACE 1 MG TABS (ESTRADIOL) 1 once daily WARFARIN SODIUM 1 MG TABS (WARFARIN SODIUM) 3 tabs once daily ICAPS MV   TABS (MULTIPLE VITAMINS-MINERALS) 1 two times a day KAPIDEX 60 MG  CPDR (DEXLANSOPRAZOLE) 1 once daily VITAMIN D 1000 UNIT CAPS (CHOLECALCIFEROL) 1 once daily ETODOLAC 300 MG CAPS (ETODOLAC) one by mouth two times a day with food CENTRUM SILVER ULTRA WOMENS  TABS (MULTIPLE VITAMINS-MINERALS) 1 once daily

## 2010-08-19 NOTE — Assessment & Plan Note (Signed)
Summary: pt/njr   Nurse Visit   Allergies: 1)  ! Pcn 2)  ! Allegra 3)  ! Celebrex (Celecoxib) 4)  Darvocet-N 100 (Propoxyphene N-Apap) Laboratory Results   Blood Tests     PT: 16.8 s   (Normal Range: 10.6-13.4)  INR: 1.9   (Normal Range: 0.88-1.12   Therap INR: 2.0-3.5) Comments: Joanne Chars CMA  August 01, 2009 2:27 PM     Orders Added: 1)  Est. Patient Level I [99211] 2)  Protime [[id number]]   ANTICOAGULATION RECORD PREVIOUS REGIMEN & LAB RESULTS Anticoagulation Diagnosis:  v58.83,v58.61,427.31 on  03/31/2007 Previous INR Goal Range:  2.0-3.0 on  01/06/2008 Previous INR:  2.8 on  06/05/2009 Previous Coumadin Dose(mg):  3mg  qd on  09/06/2008 Previous Regimen:  same on  02/15/2009 Previous Coagulation Comments:  hold one day the resume the 3 mg a day on  08/08/2008  NEW REGIMEN & LAB RESULTS Current INR: 1.9 Regimen: same  (no change)   Anticoagulation Visit Questionnaire Coumadin dose missed/changed:  No Abnormal Bleeding Symptoms:  No  Any diet changes including alcohol intake, vegetables or greens since the last visit:  No Any illnesses or hospitalizations since the last visit:  No Any signs of clotting since the last visit (including chest discomfort, dizziness, shortness of breath, arm tingling, slurred speech, swelling or redness in leg):  No  MEDICATIONS ATENOLOL 25 MG  TABS (ATENOLOL) once daily LORAZEPAM 1 MG  TABS (LORAZEPAM) 1/2 in am and 1 at bedtime ESTRACE 1 MG TABS (ESTRADIOL) 1 once daily FISH OIL   CAPS (OMEGA-3 FATTY ACIDS CAPS) once daily WARFARIN SODIUM 1 MG TABS (WARFARIN SODIUM) 3 tabs once daily exceot 5 on 7th day ICAPS MV   TABS (MULTIPLE VITAMINS-MINERALS) once daily KAPIDEX 60 MG  CPDR (DEXLANSOPRAZOLE) 1 once daily VITAMIN D 1000 UNIT CAPS (CHOLECALCIFEROL) 1 once daily ETODOLAC 300 MG CAPS (ETODOLAC) one by mouth two times a day with food

## 2010-08-19 NOTE — Assessment & Plan Note (Signed)
Summary: protime/ccm   Nurse Visit   Allergies: 1)  ! Pcn 2)  ! Allegra 3)  ! Celebrex (Celecoxib) 4)  Darvocet-N 100 Laboratory Results   Blood Tests   Date/Time Received: October 08, 2009 2:23 PM  Date/Time Reported: October 08, 2009 2:23 PM   PT: 18.4 s   (Normal Range: 10.6-13.4)  INR: 2.3   (Normal Range: 0.88-1.12   Therap INR: 2.0-3.5) Comments: Wynona Canes, CMA  October 08, 2009 2:23 PM     Orders Added: 1)  Est. Patient Level I [99211] 2)  Protime [[id number]]  Laboratory Results   Blood Tests     PT: 18.4 s   (Normal Range: 10.6-13.4)  INR: 2.3   (Normal Range: 0.88-1.12   Therap INR: 2.0-3.5) Comments: Wynona Canes, CMA  October 08, 2009 2:23 PM       ANTICOAGULATION RECORD PREVIOUS REGIMEN & LAB RESULTS Anticoagulation Diagnosis:  v58.83,v58.61,427.31 on  03/31/2007 Previous INR Goal Range:  2.0-3.0 on  01/06/2008 Previous INR:  2.9 on  09/10/2009 Previous Coumadin Dose(mg):  3mg  qd on  09/06/2008 Previous Regimen:  same on  02/15/2009 Previous Coagulation Comments:  hold one day the resume the 3 mg a day on  08/08/2008  NEW REGIMEN & LAB RESULTS Current INR: 2.3 Regimen: same  (no change)       Repeat testing in: 4 weeks MEDICATIONS ATENOLOL 25 MG  TABS (ATENOLOL) once daily LORAZEPAM 1 MG  TABS (LORAZEPAM) 1/2 in am and 1 at bedtime ESTRACE 1 MG TABS (ESTRADIOL) 1 once daily FISH OIL   CAPS (OMEGA-3 FATTY ACIDS CAPS) once daily WARFARIN SODIUM 1 MG TABS (WARFARIN SODIUM) 3 tabs once daily exceot 5 on 7th day ICAPS MV   TABS (MULTIPLE VITAMINS-MINERALS) once daily KAPIDEX 60 MG  CPDR (DEXLANSOPRAZOLE) 1 once daily VITAMIN D 1000 UNIT CAPS (CHOLECALCIFEROL) 1 once daily ETODOLAC 300 MG CAPS (ETODOLAC) one by mouth two times a day with food CENTRUM SILVER ULTRA WOMENS  TABS (MULTIPLE VITAMINS-MINERALS) 1 once daily   Anticoagulation Visit Questionnaire      Coumadin dose missed/changed:  No      Abnormal Bleeding Symptoms:  No   Any  diet changes including alcohol intake, vegetables or greens since the last visit:  No Any illnesses or hospitalizations since the last visit:  No Any signs of clotting since the last visit (including chest discomfort, dizziness, shortness of breath, arm tingling, slurred speech, swelling or redness in leg):  No

## 2010-08-19 NOTE — Assessment & Plan Note (Signed)
Summary: 3 month follow up/cjr   Vital Signs:  Patient profile:   77 year old female Height:      65 inches Weight:      138 pounds BMI:     23.05 Temp:     98.2 degrees F oral Pulse rate:   68 / minute Pulse rhythm:   regular Resp:     14 per minute BP sitting:   124 / 78  (left arm)  Vitals Entered By: Willy Eddy, LPN (April 03, 2010 10:46 AM) CC: roa-never saw ortho for rotator cuff due to no one in her network in area Is Patient Diabetic? No   Primary Care Provider:  Stacie Glaze MD  CC:  roa-never saw ortho for rotator cuff due to no one in her network in area.  History of Present Illness: has never had shingles and has a hx of chicken pox she is interested in the shot her GERD is controlled with dexilant back pain is persistant  but not increased ankle swell daily and "turn blue" she did not go to the the orthopedist for the rotator cuff  Preventive Screening-Counseling & Management  Alcohol-Tobacco     Smoking Status: never  Current Problems (verified): 1)  Rhus Dermatitis  (ICD-692.6) 2)  Rotator Cuff Syndrome, Left  (ICD-726.10) 3)  Polymyalgia Rheumatica  (ICD-725) 4)  Back Pain, Chronic  (ICD-724.5) 5)  Hair Loss  (ICD-704.00) 6)  Constipation, Intermittent  (ICD-564.00) 7)  Symptom, Tenderness, Abdominal Right Lwr Quad  (ICD-789.63) 8)  Aftercare, Long-term Use, Anticoagulants  (ICD-V58.61) 9)  Encounter For Therapeutic Drug Monitoring  (ICD-V58.83) 10)  Osteoporosis  (ICD-733.00) 11)  Depression  (ICD-311) 12)  Allergic Rhinitis  (ICD-477.9) 13)  Degenerative Joint Disease  (ICD-715.90) 14)  Gerd  (ICD-530.81) 15)  Atrial Fibrillation  (ICD-427.31)  Current Medications (verified): 1)  Atenolol 25 Mg  Tabs (Atenolol) .... Once Daily 2)  Lorazepam 1 Mg  Tabs (Lorazepam) .... 1/2 in Am and 1 At Bedtime 3)  Estrace 1 Mg Tabs (Estradiol) .Marland Kitchen.. 1 Once Daily 4)  Fish Oil   Caps (Omega-3 Fatty Acids Caps) .... Once Daily 5)  Warfarin Sodium  1 Mg Tabs (Warfarin Sodium) .... 3 Tabs Once Daily 6)  Icaps Mv   Tabs (Multiple Vitamins-Minerals) .Marland Kitchen.. 1 Two Times A Day 7)  Kapidex 60 Mg  Cpdr (Dexlansoprazole) .Marland Kitchen.. 1 Once Daily 8)  Vitamin D 1000 Unit Caps (Cholecalciferol) .Marland Kitchen.. 1 Once Daily 9)  Etodolac 300 Mg Caps (Etodolac) .... One By Mouth Two Times A Day With Food 10)  Centrum Silver Ultra Womens  Tabs (Multiple Vitamins-Minerals) .Marland Kitchen.. 1 Once Daily  Allergies (verified): 1)  ! Pcn 2)  ! Allegra 3)  ! Celebrex (Celecoxib) 4)  Darvocet-N 100  Past History:  Family History: Last updated: 01/11/2007 Family History of Cardiovascular disorder  Social History: Last updated: 08/04/2007 Retired Married Never Smoked  Risk Factors: Smoking Status: never (04/03/2010)  Past medical, surgical, family and social histories (including risk factors) reviewed, and no changes noted (except as noted below).  Past Medical History: Reviewed history from 01/11/2007 and no changes required. Atrial fibrillation GERD Allergic rhinitis Depression Osteoporosis Menopause  Past Surgical History: Reviewed history from 01/11/2007 and no changes required. TAH Cholecystectomy Hysterectomy  Family History: Reviewed history from 01/11/2007 and no changes required. Family History of Cardiovascular disorder  Social History: Reviewed history from 08/04/2007 and no changes required. Retired Married Never Smoked  Review of Systems  Flu Vaccine Consent Questions     Do you have a history of severe allergic reactions to this vaccine? no    Any prior history of allergic reactions to egg and/or gelatin? no    Do you have a sensitivity to the preservative Thimersol? no    Do you have a past history of Guillan-Barre Syndrome? no    Do you currently have an acute febrile illness? no    Have you ever had a severe reaction to latex? no    Vaccine information given and explained to patient? yes    Are you currently pregnant? no    Lot  Number:[id number]   Exp Date:01/17/2011   Site Given  Left Deltoid IM   Physical Exam  General:  Well-developed,well-nourished,in no acute distress; alert,appropriate and cooperative throughout examination Head:  normocephalic and no abnormalities observed.   Eyes:  pupils equal and pupils reactive to light.   Ears:  R ear normal and L ear normal.   Nose:  no external deformity and no nasal discharge.   Mouth:  fair dentition, posterior lymphoid hypertrophy, and postnasal drip.   Neck:  No deformities, masses, or tenderness noted. Lungs:  normal respiratory effort and no intercostal retractions.   Heart:  normal rate and regular rhythm.   Abdomen:  soft, no guarding, and no rigidity.   Msk:  right biceps mass suggestive of torn head of the biceps and mucle spasm mild triceps tendernes and less tendernss in the rotor cuuf Extremities:  No clubbing, cyanosis, edema, or deformity noted with normal full range of motion of all joints.   Neurologic:  alert & oriented X3 and finger-to-nose normal.     Impression & Recommendations:  Problem # 1:  ATRIAL FIBRILLATION (ICD-427.31) monitering of coumadin and discussion of pradax and costs and risks Her updated medication list for this problem includes:    Atenolol 25 Mg Tabs (Atenolol) ..... Once daily    Warfarin Sodium 1 Mg Tabs (Warfarin sodium) .Marland KitchenMarland KitchenMarland KitchenMarland Kitchen 3 tabs once daily  Orders: Protime ([id number]) Fingerstick ([id number])  Reviewed the following: PT: 17.1 (11/05/2009)   INR: 1.8 (04/03/2010) Next Protime: 4 weeks (dated on 03/04/2010)  Problem # 2:  BACK PAIN, CHRONIC (ICD-724.5)  Her updated medication list for this problem includes:    Etodolac 300 Mg Caps (Etodolac) ..... One by mouth two times a day with food  Discussed use of moist heat or ice, modified activities, medications, and stretching/strengthening exercises. Back care instructions given. To be seen in 2 weeks if no improvement; sooner if worsening of symptoms.   Problem #  3:  DEPRESSION (ICD-311)  Her updated medication list for this problem includes:    Lorazepam 1 Mg Tabs (Lorazepam) .Marland Kitchen... 1/2 in am and 1 at bedtime    Discussed treatment options, including trial of antidpressant medication. Will refer to behavioral health. Follow-up call in in 24-48 hours and recheck in 2 weeks, sooner as needed. Patient agrees to call if any worsening of symptoms or thoughts of doing harm arise. Verified that the patient has no suicidal ideation at this time.   Problem # 4:  OSTEOPOROSIS (ICD-733.00)  Discussed medication use, applications of heat or ice, and exercises.   Complete Medication List: 1)  Atenolol 25 Mg Tabs (Atenolol) .... Once daily 2)  Lorazepam 1 Mg Tabs (Lorazepam) .... 1/2 in am and 1 at bedtime 3)  Estrace 1 Mg Tabs (Estradiol) .Marland Kitchen.. 1 once daily 4)  Warfarin Sodium 1 Mg Tabs (Warfarin sodium) .... 3 tabs  once daily 5)  Icaps Mv Tabs (Multiple vitamins-minerals) .Marland Kitchen.. 1 two times a day 6)  Kapidex 60 Mg Cpdr (Dexlansoprazole) .Marland Kitchen.. 1 once daily 7)  Vitamin D 1000 Unit Caps (Cholecalciferol) .Marland Kitchen.. 1 once daily 8)  Etodolac 300 Mg Caps (Etodolac) .... One by mouth two times a day with food 9)  Centrum Silver Ultra Womens Tabs (Multiple vitamins-minerals) .Marland Kitchen.. 1 once daily  Other Orders: Flu Vaccine 44yrs + MEDICARE PATIENTS (N8295) Administration Flu vaccine - MCR (A2130)  Patient Instructions: 1)  Please schedule a follow-up appointment in 3 months.  Laboratory Results   Blood Tests   Date/Time Recieved: April 03, 2010 10:13 AM  Date/Time Reported: April 03, 2010 10:13 AM    INR: 1.8   (Normal Range: 0.88-1.12   Therap INR: 2.0-3.5) Comments: Wynona Canes, CMA  April 03, 2010 10:13 AM       ANTICOAGULATION RECORD PREVIOUS REGIMEN & LAB RESULTS Anticoagulation Diagnosis:  v58.83,v58.61,427.31 on  03/31/2007 Previous INR Goal Range:  2.0-3.0 on  01/06/2008 Previous INR:  2.1 on  03/04/2010 Previous Coumadin Dose(mg):   3mg  qd on  09/06/2008 Previous Regimen:  same on  03/04/2010 Previous Coagulation Comments:  hold one day the resume the 3 mg a day on  08/08/2008  NEW REGIMEN & LAB RESULTS Current INR: 1.8 Regimen: same  (no change) Coagulation Comments: OV MEDICATIONS ATENOLOL 25 MG  TABS (ATENOLOL) once daily LORAZEPAM 1 MG  TABS (LORAZEPAM) 1/2 in am and 1 at bedtime ESTRACE 1 MG TABS (ESTRADIOL) 1 once daily WARFARIN SODIUM 1 MG TABS (WARFARIN SODIUM) 3 tabs once daily ICAPS MV   TABS (MULTIPLE VITAMINS-MINERALS) 1 two times a day KAPIDEX 60 MG  CPDR (DEXLANSOPRAZOLE) 1 once daily VITAMIN D 1000 UNIT CAPS (CHOLECALCIFEROL) 1 once daily ETODOLAC 300 MG CAPS (ETODOLAC) one by mouth two times a day with food CENTRUM SILVER ULTRA WOMENS  TABS (MULTIPLE VITAMINS-MINERALS) 1 once daily   Anticoagulation Visit Questionnaire      Coumadin dose missed/changed:  No      Abnormal Bleeding Symptoms:  No   Any diet changes including alcohol intake, vegetables or greens since the last visit:  No Any illnesses or hospitalizations since the last visit:  No Any signs of clotting since the last visit (including chest discomfort, dizziness, shortness of breath, arm tingling, slurred speech, swelling or redness in leg):  No    Appended Document: Orders Update     Clinical Lists Changes  Observations: Added new observation of ZOSTAVAX LOT: [id number] (04/03/2010 11:29) Added new observation of ZOSTAVAX EXP: 03/07/2011 (04/03/2010 11:29) Added new observation of ZOSTAVAXDOSE: 0.5 ml (04/03/2010 11:29) Added new observation of ZOSTAVAX MFR: Merck (04/03/2010 11:29) Added new observation of ZOSTAVAXSITE: right deltoid (04/03/2010 11:29) Added new observation of ZOSTAVAX: Zostavax (04/03/2010 11:29)       Immunizations Administered:  Zostavax # 1:    Vaccine Type: Zostavax    Site: right deltoid    Mfr: Merck    Dose: 0.5 ml    Exp. Date: 03/07/2011    Lot #: [id number]

## 2010-08-19 NOTE — Assessment & Plan Note (Signed)
Summary: pt/njr   Nurse Visit   Allergies: 1)  ! Pcn 2)  ! Allegra 3)  ! Celebrex (Celecoxib) 4)  Darvocet-N 100 Laboratory Results   Blood Tests   Date/Time Received: November 05, 2009 3:17 PM  Date/Time Reported: November 05, 2009 3:17 PM   PT: 17.1 s   (Normal Range: 10.6-13.4)  INR: 1.9   (Normal Range: 0.88-1.12   Therap INR: 2.0-3.5) Comments: Wynona Canes, CMA  November 05, 2009 3:17 PM     Orders Added: 1)  Est. Patient Level I [99211] 2)  Protime [16109UE]  Laboratory Results   Blood Tests     PT: 17.1 s   (Normal Range: 10.6-13.4)  INR: 1.9   (Normal Range: 0.88-1.12   Therap INR: 2.0-3.5) Comments: Wynona Canes, CMA  November 05, 2009 3:17 PM       ANTICOAGULATION RECORD PREVIOUS REGIMEN & LAB RESULTS Anticoagulation Diagnosis:  v58.83,v58.61,427.31 on  03/31/2007 Previous INR Goal Range:  2.0-3.0 on  01/06/2008 Previous INR:  2.3 on  10/08/2009 Previous Coumadin Dose(mg):  3mg  qd on  09/06/2008 Previous Regimen:  same on  02/15/2009 Previous Coagulation Comments:  hold one day the resume the 3 mg a day on  08/08/2008  NEW REGIMEN & LAB RESULTS Current INR: 1.9 Regimen: same  (no change)       Repeat testing in: 4 weeks MEDICATIONS ATENOLOL 25 MG  TABS (ATENOLOL) once daily LORAZEPAM 1 MG  TABS (LORAZEPAM) 1/2 in am and 1 at bedtime ESTRACE 1 MG TABS (ESTRADIOL) 1 once daily FISH OIL   CAPS (OMEGA-3 FATTY ACIDS CAPS) once daily WARFARIN SODIUM 1 MG TABS (WARFARIN SODIUM) 3 tabs once daily exceot 5 on 7th day ICAPS MV   TABS (MULTIPLE VITAMINS-MINERALS) once daily KAPIDEX 60 MG  CPDR (DEXLANSOPRAZOLE) 1 once daily VITAMIN D 1000 UNIT CAPS (CHOLECALCIFEROL) 1 once daily ETODOLAC 300 MG CAPS (ETODOLAC) one by mouth two times a day with food CENTRUM SILVER ULTRA WOMENS  TABS (MULTIPLE VITAMINS-MINERALS) 1 once daily   Anticoagulation Visit Questionnaire      Coumadin dose missed/changed:  No      Abnormal Bleeding Symptoms:  No   Any diet  changes including alcohol intake, vegetables or greens since the last visit:  No Any illnesses or hospitalizations since the last visit:  No Any signs of clotting since the last visit (including chest discomfort, dizziness, shortness of breath, arm tingling, slurred speech, swelling or redness in leg):  No

## 2010-08-19 NOTE — Assessment & Plan Note (Signed)
Summary: knot between shoulder and elbow/njr   Vital Signs:  Patient profile:   77 year old female Height:      65 inches Weight:      136 pounds BMI:     22.71 Temp:     98.2 degrees F oral Pulse rate:   68 / minute Resp:     14 per minute BP sitting:   132 / 72  (left arm)  Vitals Entered By: Willy Eddy, LPN (February 04, 2010 10:08 AM) CC: roa- c/o pain ful knot on left upper arm Is Patient Diabetic? No   CC:  roa- c/o pain ful knot on left upper arm.  History of Present Illness: has a knot on the arm in the bi ceps area that may be a result of a torn rotator cuff the shoulder pain is better bu there is some pain in the triceps and the "mass in the biceps" area is new range of montion is limited laterally  Preventive Screening-Counseling & Management  Alcohol-Tobacco     Smoking Status: never  Problems Prior to Update: 1)  Rotator Cuff Syndrome, Left  (ICD-726.10) 2)  Polymyalgia Rheumatica  (ICD-725) 3)  Back Pain, Chronic  (ICD-724.5) 4)  Hair Loss  (ICD-704.00) 5)  Constipation, Intermittent  (ICD-564.00) 6)  Symptom, Tenderness, Abdominal Right Lwr Quad  (ICD-789.63) 7)  Aftercare, Long-term Use, Anticoagulants  (ICD-V58.61) 8)  Encounter For Therapeutic Drug Monitoring  (ICD-V58.83) 9)  Osteoporosis  (ICD-733.00) 10)  Depression  (ICD-311) 11)  Allergic Rhinitis  (ICD-477.9) 12)  Degenerative Joint Disease  (ICD-715.90) 13)  Gerd  (ICD-530.81) 14)  Atrial Fibrillation  (ICD-427.31)  Current Problems (verified): 1)  Rotator Cuff Syndrome, Left  (ICD-726.10) 2)  Polymyalgia Rheumatica  (ICD-725) 3)  Back Pain, Chronic  (ICD-724.5) 4)  Hair Loss  (ICD-704.00) 5)  Constipation, Intermittent  (ICD-564.00) 6)  Symptom, Tenderness, Abdominal Right Lwr Quad  (ICD-789.63) 7)  Aftercare, Long-term Use, Anticoagulants  (ICD-V58.61) 8)  Encounter For Therapeutic Drug Monitoring  (ICD-V58.83) 9)  Osteoporosis  (ICD-733.00) 10)  Depression  (ICD-311) 11)   Allergic Rhinitis  (ICD-477.9) 12)  Degenerative Joint Disease  (ICD-715.90) 13)  Gerd  (ICD-530.81) 14)  Atrial Fibrillation  (ICD-427.31)  Medications Prior to Update: 1)  Atenolol 25 Mg  Tabs (Atenolol) .... Once Daily 2)  Lorazepam 1 Mg  Tabs (Lorazepam) .... 1/2 in Am and 1 At Bedtime 3)  Estrace 1 Mg Tabs (Estradiol) .Marland Kitchen.. 1 Once Daily 4)  Fish Oil   Caps (Omega-3 Fatty Acids Caps) .... Once Daily 5)  Warfarin Sodium 1 Mg Tabs (Warfarin Sodium) .... 3 Tabs Once Daily 6)  Icaps Mv   Tabs (Multiple Vitamins-Minerals) .Marland Kitchen.. 1 Two Times A Day 7)  Kapidex 60 Mg  Cpdr (Dexlansoprazole) .Marland Kitchen.. 1 Once Daily 8)  Vitamin D 1000 Unit Caps (Cholecalciferol) .Marland Kitchen.. 1 Once Daily 9)  Etodolac 300 Mg Caps (Etodolac) .... One By Mouth Two Times A Day With Food 10)  Centrum Silver Ultra Womens  Tabs (Multiple Vitamins-Minerals) .Marland Kitchen.. 1 Once Daily  Current Medications (verified): 1)  Atenolol 25 Mg  Tabs (Atenolol) .... Once Daily 2)  Lorazepam 1 Mg  Tabs (Lorazepam) .... 1/2 in Am and 1 At Bedtime 3)  Estrace 1 Mg Tabs (Estradiol) .Marland Kitchen.. 1 Once Daily 4)  Fish Oil   Caps (Omega-3 Fatty Acids Caps) .... Once Daily 5)  Warfarin Sodium 1 Mg Tabs (Warfarin Sodium) .... 3 Tabs Once Daily 6)  Icaps Mv   Tabs (Multiple  Vitamins-Minerals) .Marland Kitchen.. 1 Two Times A Day 7)  Kapidex 60 Mg  Cpdr (Dexlansoprazole) .Marland Kitchen.. 1 Once Daily 8)  Vitamin D 1000 Unit Caps (Cholecalciferol) .Marland Kitchen.. 1 Once Daily 9)  Etodolac 300 Mg Caps (Etodolac) .... One By Mouth Two Times A Day With Food 10)  Centrum Silver Ultra Womens  Tabs (Multiple Vitamins-Minerals) .Marland Kitchen.. 1 Once Daily  Allergies (verified): 1)  ! Pcn 2)  ! Allegra 3)  ! Celebrex (Celecoxib) 4)  Darvocet-N 100  Past History:  Family History: Last updated: 01/11/2007 Family History of Cardiovascular disorder  Social History: Last updated: 08/04/2007 Retired Married Never Smoked  Risk Factors: Smoking Status: never (02/04/2010)  Past medical, surgical, family and social  histories (including risk factors) reviewed, and no changes noted (except as noted below).  Past Medical History: Reviewed history from 01/11/2007 and no changes required. Atrial fibrillation GERD Allergic rhinitis Depression Osteoporosis Menopause  Past Surgical History: Reviewed history from 01/11/2007 and no changes required. TAH Cholecystectomy Hysterectomy  Family History: Reviewed history from 01/11/2007 and no changes required. Family History of Cardiovascular disorder  Social History: Reviewed history from 08/04/2007 and no changes required. Retired Married Never Smoked  Review of Systems  The patient denies anorexia, fever, weight loss, weight gain, vision loss, decreased hearing, hoarseness, chest pain, syncope, dyspnea on exertion, peripheral edema, prolonged cough, headaches, hemoptysis, abdominal pain, melena, hematochezia, severe indigestion/heartburn, hematuria, incontinence, genital sores, muscle weakness, suspicious skin lesions, transient blindness, difficulty walking, depression, unusual weight change, abnormal bleeding, enlarged lymph nodes, angioedema, and breast masses.    Physical Exam  General:  Well-developed, well-nourished, normal body habitus; no deformities, normal grooming.   Head:  normocephalic and no abnormalities observed.   Eyes:  pupils equal and pupils reactive to light.   Mouth:  fair dentition, posterior lymphoid hypertrophy, and postnasal drip.   Neck:  No deformities, masses, or tenderness noted. Lungs:  normal respiratory effort and no intercostal retractions.   Heart:  normal rate and regular rhythm.   Abdomen:  soft, no guarding, and no rigidity.   Msk:  right biceps mass suggestive of torn head of the biceps and mucle spasm mild triceps tendernes and less tendernss in the rotor cuuf Pulses:  R and L carotid,radial,femoral,dorsalis pedis and posterior tibial pulses are full and equal bilaterally Extremities:  No clubbing,  cyanosis, edema, or deformity noted with normal full range of motion of all joints.     Impression & Recommendations:  Problem # 1:  ROTATOR CUFF SYNDROME, LEFT (ICD-726.10) complete tear of the head of the bicepts from the rotstor cuff referral to orthopedics ASAP has seen dr Thurston Hole in the past so will refer to his group ASAP  Problem # 2:  GERD (ICD-530.81) stable Her updated medication list for this problem includes:    Kapidex 60 Mg Cpdr (Dexlansoprazole) .Marland Kitchen... 1 once daily  Labs Reviewed: Hgb: 12.2 (09/10/2009)   Hct: 36.7 (09/10/2009)  Problem # 3:  ATRIAL FIBRILLATION (ICD-427.31)  stable she remains on coumadin Her updated medication list for this problem includes:    Atenolol 25 Mg Tabs (Atenolol) ..... Once daily    Warfarin Sodium 1 Mg Tabs (Warfarin sodium) .Marland KitchenMarland KitchenMarland KitchenMarland Kitchen 3 tabs once daily  Orders: Protime (16109UE) Fingerstick ([id number])  Reviewed the following: PT: 17.1 (11/05/2009)   INR: 1.5 (02/04/2010) Next Protime: 4 weeks (dated on 01/07/2010)  Complete Medication List: 1)  Atenolol 25 Mg Tabs (Atenolol) .... Once daily 2)  Lorazepam 1 Mg Tabs (Lorazepam) .... 1/2 in am and 1 at bedtime  3)  Estrace 1 Mg Tabs (Estradiol) .Marland Kitchen.. 1 once daily 4)  Fish Oil Caps (Omega-3 fatty acids caps) .... Once daily 5)  Warfarin Sodium 1 Mg Tabs (Warfarin sodium) .... 3 tabs once daily 6)  Icaps Mv Tabs (Multiple vitamins-minerals) .Marland Kitchen.. 1 two times a day 7)  Kapidex 60 Mg Cpdr (Dexlansoprazole) .Marland Kitchen.. 1 once daily 8)  Vitamin D 1000 Unit Caps (Cholecalciferol) .Marland Kitchen.. 1 once daily 9)  Etodolac 300 Mg Caps (Etodolac) .... One by mouth two times a day with food 10)  Centrum Silver Ultra Womens Tabs (Multiple vitamins-minerals) .Marland Kitchen.. 1 once daily  Patient Instructions: 1)  referral to [facility] NOW   ANTICOAGULATION RECORD PREVIOUS REGIMEN & LAB RESULTS Anticoagulation Diagnosis:  v58.83,v58.61,427.31 on  03/31/2007 Previous INR Goal Range:  2.0-3.0 on  01/06/2008 Previous  INR:  1.6 on  01/07/2010 Previous Coumadin Dose(mg):  3mg  qd on  09/06/2008 Previous Regimen:  same on  02/15/2009 Previous Coagulation Comments:  hold one day the resume the 3 mg a day on  08/08/2008  NEW REGIMEN & LAB RESULTS Current INR: 1.5 Regimen: same  (no change)   Anticoagulation Visit Questionnaire Coumadin dose missed/changed:  No Abnormal Bleeding Symptoms:  No  Any diet changes including alcohol intake, vegetables or greens since the last visit:  No Any illnesses or hospitalizations since the last visit:  No Any signs of clotting since the last visit (including chest discomfort, dizziness, shortness of breath, arm tingling, slurred speech, swelling or redness in leg):  No  MEDICATIONS ATENOLOL 25 MG  TABS (ATENOLOL) once daily LORAZEPAM 1 MG  TABS (LORAZEPAM) 1/2 in am and 1 at bedtime ESTRACE 1 MG TABS (ESTRADIOL) 1 once daily FISH OIL   CAPS (OMEGA-3 FATTY ACIDS CAPS) once daily WARFARIN SODIUM 1 MG TABS (WARFARIN SODIUM) 3 tabs once daily ICAPS MV   TABS (MULTIPLE VITAMINS-MINERALS) 1 two times a day KAPIDEX 60 MG  CPDR (DEXLANSOPRAZOLE) 1 once daily VITAMIN D 1000 UNIT CAPS (CHOLECALCIFEROL) 1 once daily ETODOLAC 300 MG CAPS (ETODOLAC) one by mouth two times a day with food CENTRUM SILVER ULTRA WOMENS  TABS (MULTIPLE VITAMINS-MINERALS) 1 once daily    Laboratory Results   Blood Tests      INR: 1.5   (Normal Range: 0.88-1.12   Therap INR: 2.0-3.5) Comments: Rita Ohara  February 04, 2010 9:50 AM

## 2010-08-19 NOTE — Assessment & Plan Note (Signed)
Summary: pt/cjr/pt rsc/cjr   Nurse Visit   Allergies: 1)  ! Pcn 2)  ! Allegra 3)  ! Celebrex (Celecoxib) 4)  Darvocet-N 100 Laboratory Results   Blood Tests   Date/Time Received: June 02, 2010 1:22 PM  Date/Time Reported: June 02, 2010 1:22 PM    INR: 3.2   (Normal Range: 0.88-1.12   Therap INR: 2.0-3.5) Comments: Wynona Canes, CMA  June 02, 2010 1:22 PM     Orders Added: 1)  Est. Patient Level I [99211] 2)  Protime [[id number]]  Laboratory Results   Blood Tests      INR: 3.2   (Normal Range: 0.88-1.12   Therap INR: 2.0-3.5) Comments: Wynona Canes, CMA  June 02, 2010 1:22 PM       ANTICOAGULATION RECORD PREVIOUS REGIMEN & LAB RESULTS Anticoagulation Diagnosis:  v58.83,v58.61,427.31 on  03/31/2007 Previous INR Goal Range:  2.0-3.0 on  01/06/2008 Previous INR:  1.9 on  05/01/2010 Previous Coumadin Dose(mg):  3mg  qd on  09/06/2008 Previous Regimen:  same on  05/01/2010 Previous Coagulation Comments:  Dr. Caryl Never approved on  05/01/2010  NEW REGIMEN & LAB RESULTS Current INR: 3.2 Regimen: same  (no change)       Repeat testing in: 4 weeks MEDICATIONS ATENOLOL 25 MG  TABS (ATENOLOL) once daily LORAZEPAM 1 MG  TABS (LORAZEPAM) 1/2 in am and 1 at bedtime ESTRACE 1 MG TABS (ESTRADIOL) 1 once daily WARFARIN SODIUM 1 MG TABS (WARFARIN SODIUM) 3 tabs once daily ICAPS MV   TABS (MULTIPLE VITAMINS-MINERALS) 1 two times a day KAPIDEX 60 MG  CPDR (DEXLANSOPRAZOLE) 1 once daily VITAMIN D 1000 UNIT CAPS (CHOLECALCIFEROL) 1 once daily ETODOLAC 300 MG CAPS (ETODOLAC) one by mouth two times a day with food CENTRUM SILVER ULTRA WOMENS  TABS (MULTIPLE VITAMINS-MINERALS) 1 once daily   Anticoagulation Visit Questionnaire      Coumadin dose missed/changed:  No      Abnormal Bleeding Symptoms:  No   Any diet changes including alcohol intake, vegetables or greens since the last visit:  No Any illnesses or hospitalizations since the last visit:   No Any signs of clotting since the last visit (including chest discomfort, dizziness, shortness of breath, arm tingling, slurred speech, swelling or redness in leg):  No

## 2010-08-19 NOTE — Assessment & Plan Note (Signed)
Summary: 4 MNTH ROV//[facility] rsc bmp/njr   Vital Signs:  Patient profile:   77 year old female Height:      65 inches Weight:      139 pounds BMI:     23.21 Temp:     98.2 degrees F oral Pulse rate:   76 / minute Resp:     14 per minute BP sitting:   136 / 80  (left arm)  Vitals Entered By: Willy Eddy, LPN (September 10, 2009 1:44 PM) CC: roa, Abdominal Pain   CC:  roa and Abdominal Pain.  History of Present Illness: Pt has  last her siblings this year ans has moderate anxiety and grief   Follow-Up Visit      This is a 77 year old woman who presents for Follow-up visit.  The patient denies chest pain, palpitations, dizziness, syncope, low blood sugar symptoms, high blood sugar symptoms, edema, SOB, DOE, PND, and orthopnea.  The patient reports taking meds as prescribed.  When questioned about possible medication side effects, the patient notes none.    Dyspepsia History:      There is a prior history of GERD.     Preventive Screening-Counseling & Management  Alcohol-Tobacco     Smoking Status: never  Problems Prior to Update: 1)  Polymyalgia Rheumatica  (ICD-725) 2)  Back Pain, Chronic  (ICD-724.5) 3)  Hair Loss  (ICD-704.00) 4)  Constipation, Intermittent  (ICD-564.00) 5)  Symptom, Tenderness, Abdominal Right Lwr Quad  (WJX-914.78) 6)  Aftercare, Long-term Use, Anticoagulants  (ICD-V58.61) 7)  Encounter For Therapeutic Drug Monitoring  (ICD-V58.83) 8)  Osteoporosis  (ICD-733.00) 9)  Depression  (ICD-311) 10)  Allergic Rhinitis  (ICD-477.9) 11)  Degenerative Joint Disease  (ICD-715.90) 12)  Gerd  (ICD-530.81) 13)  Atrial Fibrillation  (ICD-427.31)  Current Problems (verified): 1)  Polymyalgia Rheumatica  (ICD-725) 2)  Back Pain, Chronic  (ICD-724.5) 3)  Hair Loss  (ICD-704.00) 4)  Constipation, Intermittent  (ICD-564.00) 5)  Symptom, Tenderness, Abdominal Right Lwr Quad  (GNF-621.30) 6)  Aftercare, Long-term Use, Anticoagulants  (ICD-V58.61) 7)  Encounter  For Therapeutic Drug Monitoring  (ICD-V58.83) 8)  Osteoporosis  (ICD-733.00) 9)  Depression  (ICD-311) 10)  Allergic Rhinitis  (ICD-477.9) 11)  Degenerative Joint Disease  (ICD-715.90) 12)  Gerd  (ICD-530.81) 13)  Atrial Fibrillation  (ICD-427.31)  Medications Prior to Update: 1)  Atenolol 25 Mg  Tabs (Atenolol) .... Once Daily 2)  Lorazepam 1 Mg  Tabs (Lorazepam) .... 1/2 in Am and 1 At Bedtime 3)  Estrace 1 Mg Tabs (Estradiol) .Marland Kitchen.. 1 Once Daily 4)  Fish Oil   Caps (Omega-3 Fatty Acids Caps) .... Once Daily 5)  Warfarin Sodium 1 Mg Tabs (Warfarin Sodium) .... 3 Tabs Once Daily Exceot 5 On 7th Day 6)  Icaps Mv   Tabs (Multiple Vitamins-Minerals) .... Once Daily 7)  Kapidex 60 Mg  Cpdr (Dexlansoprazole) .Marland Kitchen.. 1 Once Daily 8)  Vitamin D 1000 Unit Caps (Cholecalciferol) .Marland Kitchen.. 1 Once Daily 9)  Etodolac 300 Mg Caps (Etodolac) .... One By Mouth Two Times A Day With Food  Current Medications (verified): 1)  Atenolol 25 Mg  Tabs (Atenolol) .... Once Daily 2)  Lorazepam 1 Mg  Tabs (Lorazepam) .... 1/2 in Am and 1 At Bedtime 3)  Estrace 1 Mg Tabs (Estradiol) .Marland Kitchen.. 1 Once Daily 4)  Fish Oil   Caps (Omega-3 Fatty Acids Caps) .... Once Daily 5)  Warfarin Sodium 1 Mg Tabs (Warfarin Sodium) .... 3 Tabs Once Daily Exceot 5  On 7th Day 6)  Icaps Mv   Tabs (Multiple Vitamins-Minerals) .... Once Daily 7)  Kapidex 60 Mg  Cpdr (Dexlansoprazole) .Marland Kitchen.. 1 Once Daily 8)  Vitamin D 1000 Unit Caps (Cholecalciferol) .Marland Kitchen.. 1 Once Daily 9)  Etodolac 300 Mg Caps (Etodolac) .... One By Mouth Two Times A Day With Food 10)  Centrum Silver Ultra Womens  Tabs (Multiple Vitamins-Minerals) .Marland Kitchen.. 1 Once Daily  Allergies (verified): 1)  ! Pcn 2)  ! Allegra 3)  ! Celebrex (Celecoxib) 4)  Darvocet-N 100  Past History:  Family History: Last updated: 01/11/2007 Family History of Cardiovascular disorder  Social History: Last updated: 08/04/2007 Retired Married Never Smoked  Risk Factors: Smoking Status: never  (09/10/2009)  Past medical, surgical, family and social histories (including risk factors) reviewed, and no changes noted (except as noted below).  Past Medical History: Reviewed history from 01/11/2007 and no changes required. Atrial fibrillation GERD Allergic rhinitis Depression Osteoporosis Menopause  Past Surgical History: Reviewed history from 01/11/2007 and no changes required. TAH Cholecystectomy Hysterectomy  Family History: Reviewed history from 01/11/2007 and no changes required. Family History of Cardiovascular disorder  Social History: Reviewed history from 08/04/2007 and no changes required. Retired Married Never Smoked  Review of Systems  The patient denies anorexia, fever, weight loss, weight gain, vision loss, decreased hearing, hoarseness, chest pain, syncope, dyspnea on exertion, peripheral edema, prolonged cough, headaches, hemoptysis, abdominal pain, melena, hematochezia, severe indigestion/heartburn, hematuria, incontinence, genital sores, muscle weakness, suspicious skin lesions, transient blindness, difficulty walking, depression, unusual weight change, abnormal bleeding, enlarged lymph nodes, angioedema, and breast masses.    Physical Exam  General:  alert and well-developed.   Head:  normocephalic and no abnormalities observed.   Eyes:  pupils equal and pupils reactive to light.   Nose:  no external deformity and no nasal discharge.   Mouth:  fair dentition, posterior lymphoid hypertrophy, and postnasal drip.   Neck:  No deformities, masses, or tenderness noted. Lungs:  normal respiratory effort and no intercostal retractions.   Heart:  normal rate and regular rhythm.   Abdomen:  soft, no guarding, and no rigidity.   Msk:  lumbar lordosis and SI joint tenderness.   Pulses:  R and L carotid,radial,femoral,dorsalis pedis and posterior tibial pulses are full and equal bilaterally Extremities:  trace left pedal edema and trace right pedal edema.      Impression & Recommendations:  Problem # 1:  POLYMYALGIA RHEUMATICA (ICD-725)  has been in remission in creased joint pain  Orders: Venipuncture (81191) TLB-Sedimentation Rate (ESR) (85652-ESR)  Problem # 2:  BACK PAIN, CHRONIC (ICD-724.5)  Her updated medication list for this problem includes:    Etodolac 300 Mg Caps (Etodolac) ..... One by mouth two times a day with food  Discussed use of moist heat or ice, modified activities, medications, and stretching/strengthening exercises. Back care instructions given. To be seen in 2 weeks if no improvement; sooner if worsening of symptoms.   Orders: TLB-Hepatic/Liver Function Pnl (80076-HEPATIC)  Problem # 3:  OSTEOPOROSIS (ICD-733.00)  Discussed medication use, applications of heat or ice, and exercises.   Orders: T-Vitamin D (25-Hydroxy) [phone])  Problem # 4:  ATRIAL FIBRILLATION (ICD-427.31)  Her updated medication list for this problem includes:    Atenolol 25 Mg Tabs (Atenolol) ..... Once daily    Warfarin Sodium 1 Mg Tabs (Warfarin sodium) .Marland KitchenMarland KitchenMarland KitchenMarland Kitchen 3 tabs once daily exceot 5 on 7th day  Orders: Protime (08657QI)  Reviewed the following: PT: 20.5 (09/10/2009)   INR: 2.9 (09/10/2009) Next  Protime: 4 weeks (dated on 09/10/2009)  Problem # 5:  GERD (ICD-530.81) Assessment: Unchanged  Her updated medication list for this problem includes:    Kapidex 60 Mg Cpdr (Dexlansoprazole) .Marland Kitchen... 1 once daily  Labs Reviewed: Hgb: 13.8 (03/31/2007)   Hct: 40.2 (03/31/2007)  Orders: TLB-CBC Platelet - w/Differential (85025-CBCD)  Complete Medication List: 1)  Atenolol 25 Mg Tabs (Atenolol) .... Once daily 2)  Lorazepam 1 Mg Tabs (Lorazepam) .... 1/2 in am and 1 at bedtime 3)  Estrace 1 Mg Tabs (Estradiol) .Marland Kitchen.. 1 once daily 4)  Fish Oil Caps (Omega-3 fatty acids caps) .... Once daily 5)  Warfarin Sodium 1 Mg Tabs (Warfarin sodium) .... 3 tabs once daily exceot 5 on 7th day 6)  Icaps Mv Tabs (Multiple vitamins-minerals)  .... Once daily 7)  Kapidex 60 Mg Cpdr (Dexlansoprazole) .Marland Kitchen.. 1 once daily 8)  Vitamin D 1000 Unit Caps (Cholecalciferol) .Marland Kitchen.. 1 once daily 9)  Etodolac 300 Mg Caps (Etodolac) .... One by mouth two times a day with food 10)  Centrum Silver Ultra Womens Tabs (Multiple vitamins-minerals) .Marland Kitchen.. 1 once daily  Patient Instructions: 1)  Please schedule a follow-up appointment in 4 months.  Laboratory Results   Blood Tests   Date/Time Recieved: September 10, 2009 1:37 PM  Date/Time Reported: September 10, 2009 1:37 PM   PT: 20.5 s   (Normal Range: 10.6-13.4)  INR: 2.9   (Normal Range: 0.88-1.12   Therap INR: 2.0-3.5) Comments: Wynona Canes, CMA  September 10, 2009 1:37 PM       ANTICOAGULATION RECORD PREVIOUS REGIMEN & LAB RESULTS Anticoagulation Diagnosis:  v58.83,v58.61,427.31 on  03/31/2007 Previous INR Goal Range:  2.0-3.0 on  01/06/2008 Previous INR:  1.9 on  08/01/2009 Previous Coumadin Dose(mg):  3mg  qd on  09/06/2008 Previous Regimen:  same on  02/15/2009 Previous Coagulation Comments:  hold one day the resume the 3 mg a day on  08/08/2008  NEW REGIMEN & LAB RESULTS Current INR: 2.9 Regimen: same  (no change)       Repeat testing in: 4 weeks MEDICATIONS ATENOLOL 25 MG  TABS (ATENOLOL) once daily LORAZEPAM 1 MG  TABS (LORAZEPAM) 1/2 in am and 1 at bedtime ESTRACE 1 MG TABS (ESTRADIOL) 1 once daily FISH OIL   CAPS (OMEGA-3 FATTY ACIDS CAPS) once daily WARFARIN SODIUM 1 MG TABS (WARFARIN SODIUM) 3 tabs once daily exceot 5 on 7th day ICAPS MV   TABS (MULTIPLE VITAMINS-MINERALS) once daily KAPIDEX 60 MG  CPDR (DEXLANSOPRAZOLE) 1 once daily VITAMIN D 1000 UNIT CAPS (CHOLECALCIFEROL) 1 once daily ETODOLAC 300 MG CAPS (ETODOLAC) one by mouth two times a day with food CENTRUM SILVER ULTRA WOMENS  TABS (MULTIPLE VITAMINS-MINERALS) 1 once daily   Anticoagulation Visit Questionnaire      Coumadin dose missed/changed:  No      Abnormal Bleeding Symptoms:  No   Any diet  changes including alcohol intake, vegetables or greens since the last visit:  No Any illnesses or hospitalizations since the last visit:  No Any signs of clotting since the last visit (including chest discomfort, dizziness, shortness of breath, arm tingling, slurred speech, swelling or redness in leg):  No

## 2010-08-19 NOTE — Assessment & Plan Note (Signed)
Summary: PT/NJR   Nurse Visit   Allergies: 1)  ! Pcn 2)  ! Allegra 3)  ! Celebrex (Celecoxib) 4)  Darvocet-N 100 Laboratory Results   Blood Tests      INR: 2.1   (Normal Range: 0.88-1.12   Therap INR: 2.0-3.5) Comments: Rita Ohara  March 04, 2010 1:49 PM     Orders Added: 1)  Est. Patient Level I [99211] 2)  Protime [[id number]]   ANTICOAGULATION RECORD PREVIOUS REGIMEN & LAB RESULTS Anticoagulation Diagnosis:  v58.83,v58.61,427.31 on  03/31/2007 Previous INR Goal Range:  2.0-3.0 on  01/06/2008 Previous INR:  1.5 on  02/04/2010 Previous Coumadin Dose(mg):  3mg  qd on  09/06/2008 Previous Regimen:  same on  02/15/2009 Previous Coagulation Comments:  hold one day the resume the 3 mg a day on  08/08/2008  NEW REGIMEN & LAB RESULTS Current INR: 2.1 Regimen: same  Repeat testing in: 4 weeks  Anticoagulation Visit Questionnaire Coumadin dose missed/changed:  No Abnormal Bleeding Symptoms:  No  Any diet changes including alcohol intake, vegetables or greens since the last visit:  No Any illnesses or hospitalizations since the last visit:  No Any signs of clotting since the last visit (including chest discomfort, dizziness, shortness of breath, arm tingling, slurred speech, swelling or redness in leg):  No  MEDICATIONS ATENOLOL 25 MG  TABS (ATENOLOL) once daily LORAZEPAM 1 MG  TABS (LORAZEPAM) 1/2 in am and 1 at bedtime ESTRACE 1 MG TABS (ESTRADIOL) 1 once daily FISH OIL   CAPS (OMEGA-3 FATTY ACIDS CAPS) once daily WARFARIN SODIUM 1 MG TABS (WARFARIN SODIUM) 3 tabs once daily ICAPS MV   TABS (MULTIPLE VITAMINS-MINERALS) 1 two times a day KAPIDEX 60 MG  CPDR (DEXLANSOPRAZOLE) 1 once daily VITAMIN D 1000 UNIT CAPS (CHOLECALCIFEROL) 1 once daily ETODOLAC 300 MG CAPS (ETODOLAC) one by mouth two times a day with food CENTRUM SILVER ULTRA WOMENS  TABS (MULTIPLE VITAMINS-MINERALS) 1 once daily PREDNISONE 10 MG TABS (PREDNISONE) taper as follows:   6-5-4-4-4-3-3-2-2-1-1

## 2010-08-21 NOTE — Assessment & Plan Note (Signed)
Summary: protime/njr   Nurse Visit   Allergies: 1)  ! Pcn 2)  ! Allegra 3)  ! Celebrex (Celecoxib) 4)  Darvocet-N 100 Laboratory Results   Blood Tests      INR: 3.6   (Normal Range: 0.88-1.12   Therap INR: 2.0-3.5) Comments: Rita Ohara  July 30, 2010 1:36 PM     Orders Added: 1)  Est. Patient Level I [99211] 2)  Protime [[id number]]   ANTICOAGULATION RECORD PREVIOUS REGIMEN & LAB RESULTS Anticoagulation Diagnosis:  v58.83,v58.61,427.31 on  03/31/2007 Previous INR Goal Range:  2.0-3.0 on  01/06/2008 Previous INR:  3.9 on  07/02/2010 Previous Coumadin Dose(mg):  3mg  qd on  09/06/2008 Previous Regimen:  same on  05/01/2010 Previous Coagulation Comments:  Dr. Caryl Never approved on  05/01/2010  NEW REGIMEN & LAB RESULTS Current INR: 3.6 Regimen: Hold every Wed. 3mg . all other days Coagulation Comments: Approved by Dr. Kirtland Bouchard Repeat testing in: 3 weeks  Anticoagulation Visit Questionnaire Coumadin dose missed/changed:  No Abnormal Bleeding Symptoms:  No  Any diet changes including alcohol intake, vegetables or greens since the last visit:  No Any illnesses or hospitalizations since the last visit:  No Any signs of clotting since the last visit (including chest discomfort, dizziness, shortness of breath, arm tingling, slurred speech, swelling or redness in leg):  No  MEDICATIONS ATENOLOL 25 MG  TABS (ATENOLOL) once daily LORAZEPAM 1 MG  TABS (LORAZEPAM) 1/2 in am and 1 at bedtime ESTRACE 1 MG TABS (ESTRADIOL) 1 once daily WARFARIN SODIUM 1 MG TABS (WARFARIN SODIUM) 3 tabs once daily ICAPS MV   TABS (MULTIPLE VITAMINS-MINERALS) 1 two times a day KAPIDEX 60 MG  CPDR (DEXLANSOPRAZOLE) 1 once daily VITAMIN D 1000 UNIT CAPS (CHOLECALCIFEROL) 1 once daily ETODOLAC 300 MG CAPS (ETODOLAC) one by mouth two times a day with food FISH OIL 1000 MG CAPS (OMEGA-3 FATTY ACIDS) 1 once daily

## 2010-08-21 NOTE — Assessment & Plan Note (Signed)
Summary: 3 month rov/njr   Vital Signs:  Patient profile:   77 year old female Height:      65 inches Weight:      138 pounds BMI:     23.05 Temp:     98.2 degrees F oral Pulse rate:   68 / minute Resp:     14 per minute BP sitting:   140 / 80  (left arm)  Vitals Entered By: Willy Eddy, LPN (July 02, 2010 1:40 PM) CC: roa Is Patient Diabetic? No   Primary Care Bishoy Cupp:  Stacie Glaze MD  CC:  roa.  History of Present Illness: follow up back pain is stable but the cold weather has increasd the pain in her hands wrists and feet... the etodolac helps she needs to take tylenol with for pain control she has an urge for sweets could this be the medication?  The pt's GERD is stable  Preventive Screening-Counseling & Management  Alcohol-Tobacco     Smoking Status: never  Current Problems (verified): 1)  Rhus Dermatitis  (ICD-692.6) 2)  Rotator Cuff Syndrome, Left  (ICD-726.10) 3)  Polymyalgia Rheumatica  (ICD-725) 4)  Back Pain, Chronic  (ICD-724.5) 5)  Hair Loss  (ICD-704.00) 6)  Constipation, Intermittent  (ICD-564.00) 7)  Symptom, Tenderness, Abdominal Right Lwr Quad  (ZOX-096.04) 8)  Aftercare, Long-term Use, Anticoagulants  (ICD-V58.61) 9)  Encounter For Therapeutic Drug Monitoring  (ICD-V58.83) 10)  Osteoporosis  (ICD-733.00) 11)  Depression  (ICD-311) 12)  Allergic Rhinitis  (ICD-477.9) 13)  Degenerative Joint Disease  (ICD-715.90) 14)  Gerd  (ICD-530.81) 15)  Atrial Fibrillation  (ICD-427.31)  Current Medications (verified): 1)  Atenolol 25 Mg  Tabs (Atenolol) .... Once Daily 2)  Lorazepam 1 Mg  Tabs (Lorazepam) .... 1/2 in Am and 1 At Bedtime 3)  Estrace 1 Mg Tabs (Estradiol) .Marland Kitchen.. 1 Once Daily 4)  Warfarin Sodium 1 Mg Tabs (Warfarin Sodium) .... 3 Tabs Once Daily 5)  Icaps Mv   Tabs (Multiple Vitamins-Minerals) .Marland Kitchen.. 1 Two Times A Day 6)  Kapidex 60 Mg  Cpdr (Dexlansoprazole) .Marland Kitchen.. 1 Once Daily 7)  Vitamin D 1000 Unit Caps (Cholecalciferol)  .Marland Kitchen.. 1 Once Daily 8)  Etodolac 300 Mg Caps (Etodolac) .... One By Mouth Two Times A Day With Food 9)  Fish Oil 1000 Mg Caps (Omega-3 Fatty Acids) .Marland Kitchen.. 1 Once Daily  Allergies (verified): 1)  ! Pcn 2)  ! Allegra 3)  ! Celebrex (Celecoxib) 4)  Darvocet-N 100   Impression & Recommendations:  Problem # 1:  DEGENERATIVE JOINT DISEASE (ICD-715.90) Assessment Deteriorated  Her updated medication list for this problem includes:    Etodolac 300 Mg Caps (Etodolac) ..... One by mouth two times a day with food  Discussed use of medications, application of heat or cold, and exercises.   Orders: Venipuncture (54098) TLB-CBC Platelet - w/Differential (85025-CBCD) TLB-CRP-High Sensitivity (C-Reactive Protein) (86140-FCRP)  Problem # 2:  POLYMYALGIA RHEUMATICA (ICD-725) Assessment: Deteriorated  hx of PMR, check sed rate today  Orders: Sedimentation Rate, non-automated (11914)  Problem # 3:  ARTHRITIS, RIGHT FOOT (ICD-716.97)  Informed consen obtained and then the joint was prepped in a sterile manor and 40 mg depo and 1/2 cc 1% lidocaine injected into the synovial space. After care discussed. Pt tolerated procedure well.  Orders: Joint Aspirate / Injection, Small (78295) Depo-Medrol 20mg  (J1020)  Problem # 4:  ATRIAL FIBRILLATION (ICD-427.31)  Her updated medication list for this problem includes:    Atenolol 25 Mg Tabs (Atenolol) .Marland KitchenMarland KitchenMarland KitchenMarland Kitchen  Once daily    Warfarin Sodium 1 Mg Tabs (Warfarin sodium) .Marland KitchenMarland KitchenMarland KitchenMarland Kitchen 3 tabs once daily  Orders: Protime (16109UE) Fingerstick (45409)  Reviewed the following: PT: 17.1 (11/05/2009)   INR: 3.9 (07/02/2010) Next Protime: 4 weeks (dated on 06/02/2010)  Complete Medication List: 1)  Atenolol 25 Mg Tabs (Atenolol) .... Once daily 2)  Lorazepam 1 Mg Tabs (Lorazepam) .... 1/2 in am and 1 at bedtime 3)  Estrace 1 Mg Tabs (Estradiol) .Marland Kitchen.. 1 once daily 4)  Warfarin Sodium 1 Mg Tabs (Warfarin sodium) .... 3 tabs once daily 5)  Icaps Mv Tabs (Multiple  vitamins-minerals) .Marland Kitchen.. 1 two times a day 6)  Kapidex 60 Mg Cpdr (Dexlansoprazole) .Marland Kitchen.. 1 once daily 7)  Vitamin D 1000 Unit Caps (Cholecalciferol) .Marland Kitchen.. 1 once daily 8)  Etodolac 300 Mg Caps (Etodolac) .... One by mouth two times a day with food 9)  Fish Oil 1000 Mg Caps (Omega-3 fatty acids) .Marland Kitchen.. 1 once daily  Patient Instructions: 1)  cherry joice extract caps and tumeric for athritis 2)  Please schedule a follow-up appointment in 3 months.   Orders Added: 1)  Protime [85610QW] 2)  Fingerstick [36416] 3)  Sedimentation Rate, non-automated [85651] 4)  Venipuncture [36415] 5)  TLB-CBC Platelet - w/Differential [85025-CBCD] 6)  TLB-CRP-High Sensitivity (C-Reactive Protein) [86140-FCRP] 7)  Est. Patient Level IV [81191] 8)  Joint Aspirate / Injection, Small [20600] 9)  Depo-Medrol 20mg  [J1020]     ANTICOAGULATION RECORD PREVIOUS REGIMEN & LAB RESULTS Anticoagulation Diagnosis:  v58.83,v58.61,427.31 on  03/31/2007 Previous INR Goal Range:  2.0-3.0 on  01/06/2008 Previous INR:  3.2 on  06/02/2010 Previous Coumadin Dose(mg):  3mg  qd on  09/06/2008 Previous Regimen:  same on  05/01/2010 Previous Coagulation Comments:  Dr. Caryl Never approved on  05/01/2010  NEW REGIMEN & LAB RESULTS Current INR: 3.9 Regimen: same  (no change)   Anticoagulation Visit Questionnaire Coumadin dose missed/changed:  No Abnormal Bleeding Symptoms:  No  Any diet changes including alcohol intake, vegetables or greens since the last visit:  No Any illnesses or hospitalizations since the last visit:  No Any signs of clotting since the last visit (including chest discomfort, dizziness, shortness of breath, arm tingling, slurred speech, swelling or redness in leg):  No  MEDICATIONS ATENOLOL 25 MG  TABS (ATENOLOL) once daily LORAZEPAM 1 MG  TABS (LORAZEPAM) 1/2 in am and 1 at bedtime ESTRACE 1 MG TABS (ESTRADIOL) 1 once daily WARFARIN SODIUM 1 MG TABS (WARFARIN SODIUM) 3 tabs once daily ICAPS MV   TABS  (MULTIPLE VITAMINS-MINERALS) 1 two times a day KAPIDEX 60 MG  CPDR (DEXLANSOPRAZOLE) 1 once daily VITAMIN D 1000 UNIT CAPS (CHOLECALCIFEROL) 1 once daily ETODOLAC 300 MG CAPS (ETODOLAC) one by mouth two times a day with food FISH OIL 1000 MG CAPS (OMEGA-3 FATTY ACIDS) 1 once daily    Laboratory Results   Blood Tests      INR: 3.9   (Normal Range: 0.88-1.12   Therap INR: 2.0-3.5) Comments: Rita Ohara  July 02, 2010 1:34 PM      Appended Document: 3 month rov/njr     Problems Prior to Update: 1)  Arthritis, Right Foot  (ICD-716.97) 2)  Rhus Dermatitis  (ICD-692.6) 3)  Rotator Cuff Syndrome, Left  (ICD-726.10) 4)  Polymyalgia Rheumatica  (ICD-725) 5)  Back Pain, Chronic  (ICD-724.5) 6)  Hair Loss  (ICD-704.00) 7)  Constipation, Intermittent  (ICD-564.00) 8)  Symptom, Tenderness, Abdominal Right Lwr Quad  (ICD-789.63) 9)  Aftercare, Long-term Use, Anticoagulants  (ICD-V58.61) 10)  Encounter For Therapeutic Drug Monitoring  (ICD-V58.83) 11)  Osteoporosis  (ICD-733.00) 12)  Depression  (ICD-311) 13)  Allergic Rhinitis  (ICD-477.9) 14)  Degenerative Joint Disease  (ICD-715.90) 15)  Gerd  (ICD-530.81) 16)  Atrial Fibrillation  (ICD-427.31)  Medications Prior to Update: 1)  Atenolol 25 Mg  Tabs (Atenolol) .... Once Daily 2)  Lorazepam 1 Mg  Tabs (Lorazepam) .... 1/2 in Am and 1 At Bedtime 3)  Estrace 1 Mg Tabs (Estradiol) .Marland Kitchen.. 1 Once Daily 4)  Warfarin Sodium 1 Mg Tabs (Warfarin Sodium) .... 3 Tabs Once Daily 5)  Icaps Mv   Tabs (Multiple Vitamins-Minerals) .Marland Kitchen.. 1 Two Times A Day 6)  Kapidex 60 Mg  Cpdr (Dexlansoprazole) .Marland Kitchen.. 1 Once Daily 7)  Vitamin D 1000 Unit Caps (Cholecalciferol) .Marland Kitchen.. 1 Once Daily 8)  Etodolac 300 Mg Caps (Etodolac) .... One By Mouth Two Times A Day With Food 9)  Fish Oil 1000 Mg Caps (Omega-3 Fatty Acids) .Marland Kitchen.. 1 Once Daily  Current Medications (verified): 1)  Atenolol 25 Mg  Tabs (Atenolol) .... Once Daily 2)  Lorazepam 1 Mg  Tabs  (Lorazepam) .... 1/2 in Am and 1 At Bedtime 3)  Estrace 1 Mg Tabs (Estradiol) .Marland Kitchen.. 1 Once Daily 4)  Warfarin Sodium 1 Mg Tabs (Warfarin Sodium) .... 3 Tabs Once Daily 5)  Icaps Mv   Tabs (Multiple Vitamins-Minerals) .Marland Kitchen.. 1 Two Times A Day 6)  Kapidex 60 Mg  Cpdr (Dexlansoprazole) .Marland Kitchen.. 1 Once Daily 7)  Vitamin D 1000 Unit Caps (Cholecalciferol) .Marland Kitchen.. 1 Once Daily 8)  Etodolac 300 Mg Caps (Etodolac) .... One By Mouth Two Times A Day With Food 9)  Fish Oil 1000 Mg Caps (Omega-3 Fatty Acids) .Marland Kitchen.. 1 Once Daily  Allergies (verified): 1)  ! Pcn 2)  ! Allegra 3)  ! Celebrex (Celecoxib) 4)  Darvocet-N 100  Past History:  Family History: Last updated: 01/11/2007 Family History of Cardiovascular disorder  Social History: Last updated: 08/04/2007 Retired Married Never Smoked  Risk Factors: Smoking Status: never (07/02/2010)  Past medical, surgical, family and social histories (including risk factors) reviewed, and no changes noted (except as noted below).  Past Medical History: Reviewed history from 01/11/2007 and no changes required. Atrial fibrillation GERD Allergic rhinitis Depression Osteoporosis Menopause  Past Surgical History: Reviewed history from 01/11/2007 and no changes required. TAH Cholecystectomy Hysterectomy  Family History: Reviewed history from 01/11/2007 and no changes required. Family History of Cardiovascular disorder  Social History: Reviewed history from 08/04/2007 and no changes required. Retired Married Never Smoked  Review of Systems  The patient denies anorexia, fever, weight loss, weight gain, vision loss, decreased hearing, hoarseness, chest pain, syncope, dyspnea on exertion, peripheral edema, prolonged cough, headaches, hemoptysis, abdominal pain, melena, hematochezia, severe indigestion/heartburn, hematuria, incontinence, genital sores, muscle weakness, suspicious skin lesions, transient blindness, difficulty walking, depression, unusual  weight change, abnormal bleeding, enlarged lymph nodes, angioedema, and breast masses.         multi[le jopint pain  Physical Exam  General:  Well-developed,well-nourished,in no acute distress; alert,appropriate and cooperative throughout examination Head:  normocephalic and no abnormalities observed.   Eyes:  pupils equal and pupils reactive to light.   Ears:  R ear normal and L ear normal.   Nose:  no external deformity and no nasal discharge.   Mouth:  fair dentition, posterior lymphoid hypertrophy, and postnasal drip.   Neck:  No deformities, masses, or tenderness noted. Lungs:  normal respiratory effort and no intercostal retractions.   Heart:  normal rate and  regular rhythm.   Msk:  tender in mid foot with no sweeling or redness Neurologic:  No cranial nerve deficits noted. Station and gait are normal. Plantar reflexes are down-going bilaterally. DTRs are symmetrical throughout. Sensory, motor and coordinative functions appear intact.   Complete Medication List: 1)  Atenolol 25 Mg Tabs (Atenolol) .... Once daily 2)  Lorazepam 1 Mg Tabs (Lorazepam) .... 1/2 in am and 1 at bedtime 3)  Estrace 1 Mg Tabs (Estradiol) .Marland Kitchen.. 1 once daily 4)  Warfarin Sodium 1 Mg Tabs (Warfarin sodium) .... 3 tabs once daily 5)  Icaps Mv Tabs (Multiple vitamins-minerals) .Marland Kitchen.. 1 two times a day 6)  Kapidex 60 Mg Cpdr (Dexlansoprazole) .Marland Kitchen.. 1 once daily 7)  Vitamin D 1000 Unit Caps (Cholecalciferol) .Marland Kitchen.. 1 once daily 8)  Etodolac 300 Mg Caps (Etodolac) .... One by mouth two times a day with food 9)  Fish Oil 1000 Mg Caps (Omega-3 fatty acids) .Marland Kitchen.. 1 once daily  Appended Document: Orders Update     Clinical Lists Changes  Orders: Added new Service order of Specimen Handling ([id number]) - Signed

## 2010-08-23 ENCOUNTER — Other Ambulatory Visit: Payer: Self-pay | Admitting: Internal Medicine

## 2010-08-27 ENCOUNTER — Other Ambulatory Visit (INDEPENDENT_AMBULATORY_CARE_PROVIDER_SITE_OTHER): Payer: Medicare PPO | Admitting: Internal Medicine

## 2010-08-27 DIAGNOSIS — I4891 Unspecified atrial fibrillation: Secondary | ICD-10-CM

## 2010-08-27 LAB — POCT INR: INR: 3.5

## 2010-09-03 NOTE — Patient Instructions (Signed)
°  Latest dosing instructions   Total Sun Mon Tue Wed Thu Fri Sat   18 3 mg 3 mg 3 mg  3 mg 3 mg 3 mg    (1 mg3) (1 mg3) (1 mg3)  (1 mg3) (1 mg3) (1 mg3)

## 2010-09-11 ENCOUNTER — Other Ambulatory Visit: Payer: Self-pay | Admitting: Internal Medicine

## 2010-09-30 ENCOUNTER — Encounter: Payer: Self-pay | Admitting: Internal Medicine

## 2010-10-01 ENCOUNTER — Encounter: Payer: Self-pay | Admitting: Internal Medicine

## 2010-10-01 ENCOUNTER — Ambulatory Visit (INDEPENDENT_AMBULATORY_CARE_PROVIDER_SITE_OTHER): Payer: Medicare PPO | Admitting: Internal Medicine

## 2010-10-01 VITALS — BP 136/80 | HR 72 | Temp 98.8°F | Resp 16 | Ht 65.0 in | Wt 151.0 lb

## 2010-10-01 DIAGNOSIS — F329 Major depressive disorder, single episode, unspecified: Secondary | ICD-10-CM

## 2010-10-01 DIAGNOSIS — J069 Acute upper respiratory infection, unspecified: Secondary | ICD-10-CM

## 2010-10-01 DIAGNOSIS — I509 Heart failure, unspecified: Secondary | ICD-10-CM

## 2010-10-01 DIAGNOSIS — I4891 Unspecified atrial fibrillation: Secondary | ICD-10-CM

## 2010-10-01 LAB — CBC WITH DIFFERENTIAL/PLATELET
Basophils Relative: 0.5 % (ref 0.0–3.0)
Eosinophils Absolute: 0.2 10*3/uL (ref 0.0–0.7)
Hemoglobin: 12.5 g/dL (ref 12.0–15.0)
Lymphocytes Relative: 16.8 % (ref 12.0–46.0)
MCHC: 33.8 g/dL (ref 30.0–36.0)
MCV: 96.1 fl (ref 78.0–100.0)
Neutro Abs: 3.9 10*3/uL (ref 1.4–7.7)
RBC: 3.86 Mil/uL — ABNORMAL LOW (ref 3.87–5.11)

## 2010-10-01 LAB — BASIC METABOLIC PANEL
BUN: 12 mg/dL (ref 6–23)
GFR: 106.08 mL/min (ref >60.00)
Glucose, Bld: 77 mg/dL (ref 70–99)
Potassium: 4 mEq/L (ref 3.5–5.1)

## 2010-10-01 MED ORDER — PSEUDOEPH-BROMPHEN-DM 30-2-10 MG/5ML PO SYRP: 5.0000 mL | ORAL_SOLUTION | Freq: Three times a day (TID) | ORAL | Status: AC | PRN

## 2010-10-01 MED ORDER — FUROSEMIDE 20 MG PO TABS: 20.0000 mg | ORAL_TABLET | Freq: Every day | ORAL | Status: DC

## 2010-10-01 MED ORDER — CLARITHROMYCIN ER 500 MG PO TB24: 1000.0000 mg | ORAL_TABLET | Freq: Every day | ORAL | Status: DC

## 2010-10-01 NOTE — Progress Notes (Signed)
  Subjective:    Patient ID: Megan Olsen, female    DOB: [birth year], 77 y.o.   MRN: [id number]  HPI  is an 77 year old white female with a history of atrial fibrillation osteoarthritis gastroesophageal reflux with no prior history of congestive heart failure who presents with increasing shortness of breath cough and symptoms that she feels is an upper respiratory tract infection however she has noticed increased edema in her feet over the past few days.   she denies any chest pain he does get some palpitations she does note subjective shortness of breath with exertion she denies any diaphoresis she's had a a productive cough ,  upper congestion, and what she believes to be a low-grade fever   Review of Systems  Constitutional: Positive for activity change, appetite change and fatigue.  HENT: Positive for congestion and postnasal drip.   Eyes: Negative.   Respiratory: Positive for cough, chest tightness and shortness of breath.   Cardiovascular: Positive for palpitations.  Gastrointestinal: Negative.   Genitourinary: Negative.   Musculoskeletal: Negative.        Objective:   Physical Exam     On physical examination she is a pleasant elderly white female in moderate respiratory distress with respiratory rate approximately 20 to are equal round reactive to light and accommodation shouldn't neck was supple without JVD or bruit lung fields were essentially clear with slight decreased breast sounds at the bases bilaterally heart examination revealed an irregular rate irregular rhythm with a ventricular response of around 80 her abdomen was soft and nontender extremity examination revealed 1+ pitting edema to the midcalf    Assessment & Plan:   possible congestive heart ability today are associated with an increase in heart rate due to uncontrolled atrial fibrillation the scope it stimulated by the respiratory tract infection and mild hypoxemia therefore we will treat the upper respiratory  tract infection and monitor a basic metabolic panel and  BNP.  symptoms worsen I told her that a low threshold for her going to the emergency room she'll come back to our office in one week for recheck should there be a marked rise in the BNP I would think since this is new congestive heart failure an echocardiogram and a more complete workup would be followed.  I will see her back in one week.

## 2010-10-01 NOTE — Assessment & Plan Note (Signed)
Her atrial fibrillation has been moderately well controlled but she has had an increased respiratory rate with an upper respiratory tract infection and she does have 1+ edema in her feet and mild increased shortness of breath in indicating mild volume overload possible early congestive heart failure

## 2010-10-01 NOTE — Assessment & Plan Note (Signed)
Stable on the ativan

## 2010-10-01 NOTE — Assessment & Plan Note (Signed)
.   Acute upper respiratory tract infection with cough may be the precipitant of the mild increase in her atrial fibrillation the result of probable rate related heart failure therefore we will begin her on cough medicine and an antibotic

## 2010-10-08 ENCOUNTER — Encounter: Payer: Self-pay | Admitting: Internal Medicine

## 2010-10-08 ENCOUNTER — Ambulatory Visit (INDEPENDENT_AMBULATORY_CARE_PROVIDER_SITE_OTHER): Payer: Medicare PPO | Admitting: Internal Medicine

## 2010-10-08 DIAGNOSIS — I509 Heart failure, unspecified: Secondary | ICD-10-CM

## 2010-10-08 DIAGNOSIS — I5031 Acute diastolic (congestive) heart failure: Secondary | ICD-10-CM | POA: Insufficient documentation

## 2010-10-08 DIAGNOSIS — I4891 Unspecified atrial fibrillation: Secondary | ICD-10-CM

## 2010-10-08 MED ORDER — BISOPROLOL-HYDROCHLOROTHIAZIDE 5-6.25 MG PO TABS: 1.0000 | ORAL_TABLET | Freq: Every day | ORAL | Status: DC

## 2010-10-08 NOTE — Assessment & Plan Note (Addendum)
She'll get an echocardiogram and we will see what her diastolic function is she is currently on atenolol and I believe she needs a mild diuretic we will start with Ziac 5/6.25 and monitor the effect on her blood pressure and her tolerance of the medication I suspect she'll tolerate this even better than the atenolol

## 2010-10-08 NOTE — Assessment & Plan Note (Signed)
She feels her rate been better with less SOB She is post ablation with Megan Olsen in 2005? She is back in AF

## 2010-10-09 NOTE — Progress Notes (Signed)
Subjective:    Patient ID: Megan Olsen, female    DOB: 1928-04-30, 77 y.o.   MRN: [id number]  HPI  patient is an 77 year old white female with a history of atrial fibrillation status post ablation but in recurrent atrial fibrillation she is on Coumadin she presented approximately 2 weeks ago with increasing edema in her lower 70s and some episodes of increased heart rate.   she was in evident heart failure as evidenced by an increase in BNP  she denied chest pain diaphoresis nausea or any symptoms of coronary disease and she was placed on a low dose of Lasix 20 mg by mouth daily with complete resolution of the edema.  She presents today feeling well asking to get off the Lasix if possible and with a bradycardic rhythm at rest.     Review of Systems  Constitutional: Negative for activity change, appetite change and fatigue.  HENT: Negative for ear pain, congestion, neck pain, postnasal drip and sinus pressure.   Eyes: Negative for redness and visual disturbance.  Respiratory: Negative for cough, shortness of breath and wheezing.   Gastrointestinal: Negative for abdominal pain and abdominal distention.  Genitourinary: Negative for dysuria, frequency and menstrual problem.  Musculoskeletal: Negative for myalgias, joint swelling and arthralgias.  Skin: Negative for rash and wound.  Neurological: Negative for dizziness, weakness and headaches.  Hematological: Negative for adenopathy. Does not bruise/bleed easily.  Psychiatric/Behavioral: Negative for sleep disturbance and decreased concentration.   Past Medical History  Diagnosis Date  . GERD (gastroesophageal reflux disease)   . Depression   . Osteoporosis   . Atrial fibrillation    Past Surgical History  Procedure Date  . Cholecystectomy   . Abdominal hysterectomy   . Total abdominal hysterectomy     reports that she has never smoked. She does not have any smokeless tobacco history on file. She reports that she does not  drink alcohol or use illicit drugs. family history is not on file. Allergies  Allergen Reactions  . Celecoxib   . Fexofenadine     REACTION: Rash  . Penicillins     REACTION: Hives rash  . Propoxyphene N-Acetaminophen     REACTION: nausia        Objective:   Physical Exam  Constitutional: She is oriented to person, place, and time. She appears well-developed and well-nourished. No distress.  HENT:  Head: Normocephalic and atraumatic.  Right Ear: External ear normal.  Left Ear: External ear normal.  Nose: Nose normal.  Mouth/Throat: Oropharynx is clear and moist.  Eyes: Conjunctivae and EOM are normal. Pupils are equal, round, and reactive to light.  Neck: Normal range of motion. Neck supple. No JVD present. No tracheal deviation present. No thyromegaly present.  Cardiovascular: Intact distal pulses.   No murmur heard.       Irregularly irregular rate approximately 50  Pulmonary/Chest: Effort normal and breath sounds normal. She has no wheezes. She exhibits no tenderness.  Abdominal: Soft. Bowel sounds are normal.  Musculoskeletal: Normal range of motion. She exhibits no edema and no tenderness.  Lymphadenopathy:    She has no cervical adenopathy.  Neurological: She is alert and oriented to person, place, and time. She has normal reflexes. No cranial nerve deficit.  Skin: Skin is warm and dry. She is not diaphoretic.  Psychiatric: She has a normal mood and affect. Her behavior is normal.          Assessment & Plan:   this is a patient followed by cardiology for  atrial fibrillation she had an ablation approximately 6 years ago she remains in atrial fibrillation and apparently had an episode where her rate went out of control she developed mild rate-related heart failure we have recommended that she get an echocardiogram to confirm that this was just rate related and there is no new evidence of any cardiac dysfunction specifically any wall motion abnormality we will also  follow her for reduced dose of Lasix. And change her beta blocker from a low-dose Toprol to a bisoprolol.

## 2010-10-16 ENCOUNTER — Other Ambulatory Visit (HOSPITAL_COMMUNITY): Payer: Self-pay | Admitting: Radiology

## 2010-10-16 DIAGNOSIS — I509 Heart failure, unspecified: Secondary | ICD-10-CM

## 2010-10-16 DIAGNOSIS — R0989 Other specified symptoms and signs involving the circulatory and respiratory systems: Secondary | ICD-10-CM

## 2010-10-17 ENCOUNTER — Ambulatory Visit (HOSPITAL_COMMUNITY): Payer: Medicare PPO | Attending: Internal Medicine | Admitting: Radiology

## 2010-10-17 DIAGNOSIS — I4891 Unspecified atrial fibrillation: Secondary | ICD-10-CM | POA: Insufficient documentation

## 2010-10-17 DIAGNOSIS — I079 Rheumatic tricuspid valve disease, unspecified: Secondary | ICD-10-CM | POA: Insufficient documentation

## 2010-10-17 DIAGNOSIS — I509 Heart failure, unspecified: Secondary | ICD-10-CM | POA: Insufficient documentation

## 2010-11-24 ENCOUNTER — Ambulatory Visit (INDEPENDENT_AMBULATORY_CARE_PROVIDER_SITE_OTHER): Payer: Medicare PPO | Admitting: Internal Medicine

## 2010-11-24 VITALS — BP 132/74 | HR 60 | Temp 98.2°F | Resp 14 | Ht 65.0 in | Wt 144.0 lb

## 2010-11-24 DIAGNOSIS — R21 Rash and other nonspecific skin eruption: Secondary | ICD-10-CM

## 2010-11-24 DIAGNOSIS — I4891 Unspecified atrial fibrillation: Secondary | ICD-10-CM

## 2010-11-24 LAB — POCT INR: INR: 1.6

## 2010-11-24 MED ORDER — CLOTRIMAZOLE-BETAMETHASONE 1-0.05 % EX CREA: TOPICAL_CREAM | CUTANEOUS | Status: DC

## 2010-11-25 ENCOUNTER — Other Ambulatory Visit: Payer: Self-pay | Admitting: Internal Medicine

## 2010-12-02 NOTE — Op Note (Signed)
NAMESHAMIR, SEDLAR               ACCOUNT NO.:  [id number]   MEDICAL RECORD NO.:  [id number]          PATIENT TYPE:  AMB   LOCATION:  SDS                          FACILITY:  [facility]   PHYSICIAN:  Alford Highland. Rankin, M.D.   DATE OF BIRTH:  10-13-1927   DATE OF PROCEDURE:  01/31/2007  DATE OF DISCHARGE:                               OPERATIVE REPORT   PREOPERATIVE DIAGNOSIS:  Epiretinal membrane, right eye.   POSTOPERATIVE DIAGNOSES:  1. Epiretinal membrane, right eye.  2. Internal limiting membrane history deeper than the epiretinal      membrane.   PROCEDURE:  1. Posterior vitrectomy with membrane peel-epiretinal membrane - 25      gauge.  2. Secondary deeper removal of internal limiting membrane over the      macular region.   SURGEON:  Alford Highland. Rankin, M.D.   ANESTHESIA:  Local with monitored anesthesia control.   INDICATIONS FOR PROCEDURE:  The patient is a 77 year old woman who has  significant impairment of visual acuity and visual function in the right  eye on the basis of epiretinal membrane with macular topographic  distortion.  The patient understands this is an attempt to improve her  visual function because of the patient's topographical distortion in the  macular region inducing impairment of activities of daily living in the  right eye.  She understands the risks of anesthesia, including the risks  of death, but also including but not limited to from the conditions of  such a repair including hemorrhage, infection, scarring, need for  further surgery, no change in vision, loss of vision, progression of  disease despite intervention.  Appropriate signed consent was obtained  and the patient taken to the operating room.   DESCRIPTION OF PROCEDURE:  In the operating room, appropriate monitoring  followed by mild sedation.  Marcaine 0.75% was delivered, 5 mL  retrobulbar, followed by additional 5 mL, the latter in the fashion of a  modified [employer]  The right  periocular region was sterilely prepped  and draped in the usual ophthalmic fashion.  A lid speculum was applied.  A 25-gauge trocar was then placed in the inferotemporal quadrant.  The  superior trocars were applied.  Core vitrectomy was then begun position  induced.  Posterior vitreous tent was created nasal to the optic nerve  and this was elevated off the macular region without difficulty.  Forceps were then used to engage the epiretinal membrane.  Deeper than  this was also a sheet of internal underlying membrane with underlying  strand topographic distortion.  This was engaged as well directly and  this was removed in a continuous sheet off the fovea region.   We verified at this time peripheral retinal space found to be free of  retinal holes or tears.  The vitreous skirt had been trimmed 360 degrees  and the retrolental hyaloid had been removed.  At this time, superior  trocars were removed.  The infusion was removed.  Subconjunctival  Decadron applied.  A sterile patch and Fox shield were applied.  The  patient tolerated the procedure without complication.  Alford Highland Rankin, M.D.  Electronically Signed     GAR/MEDQ  D:  01/31/2007  T:  01/31/2007  Job:  [id number]

## 2010-12-05 NOTE — Consult Note (Signed)
NAME:  Megan Olsen, Megan Olsen                         ACCOUNT NO.:  [id number]   MEDICAL RECORD NO.:  [id number]                   PATIENT TYPE:  INP   LOCATION:  3033                                 FACILITY:  [facility]   PHYSICIAN:  Duke Salvia, M.D. [facility]           DATE OF BIRTH:  [birth year]   DATE OF CONSULTATION:  06/25/2002  DATE OF DISCHARGE:                                   CONSULTATION   REASON FOR CONSULTATION:  Thank you very much for asking [place] to see Megan Olsen in electrophysiological consultation following a stroke with  evidence of atrial arrhythmias.   Megan Olsen is a 77 year old, retired, single woman, with no children, who  has a history of tachy palpitations for which she underwent  electrophysiological study in March 2002, with RF ablation of a left atrial  focus located near her coronary sinus.  Following ablation there appeared to  be PACs originating from multiple other left atrial foci.  She was initially  treated with flecainide at least for 24 hours.  This was discontinued, then  atenolol and Coumadin, the latter of which was stopped a couple of months  ago.   She has really done quite well since then.  She has had a couple of episodes  of tachy palpitations, but these have not been severe.  She also had an  episode where she had palpitations followed by extreme confusion and  disorientation; she had been walking and was unable to figure out how she  had gotten to where she was in the park or where she was supposed to be  going.  This resolved after a couple of minutes.   On the day of admission she developed sudden right upper extremity weakness  with a facial droop which she noticed in the mirror and slurred speech which  she noted when she was talking to somebody on the phone.  There was,  apparently, also blurred vision at the same time.  She presented, and this  was all resolved at this point except for a little bit of residual facial  droop.   Her  evaluation included a CT scan that showed a small lacunar stroke.   Telemetry has shown evidence of a left atrial tachycardia at a rate of about  120 with variable block and sinus rhythm with second-degree type I block as  well.  This occurred with bradycardia.  Atenolol was discontinued and we  have been consulted.   The patient's exercise tolerance has been quite good.  She has had no  problems with chest discomfort.   PAST MEDICAL HISTORY:  Notable for arthritis.   PAST SURGICAL HISTORY:  Notable for hysterectomy, appendectomy and  cholecystectomy.   MEDICATIONS:  Atenolol 25, Ativan one twice a day and Vioxx.   ALLERGIES:  PENICILLIN.   FAMILY HISTORY:  Noncontributory.   PHYSICAL EXAMINATION:  GENERAL:  She is a delightful elderly, Caucasian  female, in no acute distress.  VITAL SIGNS:  Blood pressure 144/72, pulse 50s and irregular, respirations  14 and nonlabored.  HEENT:  No __________  or xanthomata.  The neck veins were flat.  The  carotids were brisk and full bilaterally without bruits.  BACK:  Without kyphosis or scoliosis.  LUNGS:  Clear.  ABDOMEN:  Soft, with active bowel sounds, without midline pulsation or  hepatomegaly.  EXTREMITIES:  Femoral pulses were two plus.  Distal pulses were intact.  There was no clubbing, cyanosis or edema.  NEUROLOGIC:  Persistent mild right facial droop.  Speech was slurred  earlier.  When seen __________  is not as much appreciated now.   LABORATORY DATA:  Head CT was as noted before.   Electrocardiogram demonstrate sinus rhythm with variable rates, including  bradycardia, with variable A-V condition.  There is frequent Mobitz I block.  She also has episodes of a left atrial tachycardia, cycle length of 440  milliseconds, again with variable, but higher grade block.   IMPRESSION:  1. Recent left brain stroke, question mechanism.  2. Sinus rhythm and left atrial tachycardia, both associated with variable A-     V conduction,  resulting in bradycardia.  3. Prior radiofrequency catheter ablation of a left atrial tachycardia via     patent foramen ovale, with multiple left atrial premature atrial     contractions noted, previously on Coumadin.  4. Antecedent history of atrial fibrillation without documentation.   [name] has had a left brain stroke, the mechanism of which is not clear  to me, but may have significant implications as to appropriate therapy.  If  it is thromboembolic, Coumadin would be in order; potentially other  mechanisms of stroke might be treated differently.  A neurological  consultation may be of some benefit here, but I would defer this to Dr.  Cato Mulligan.   The patient's rhythm is notable for a marked variability in the focus.  Both  sinus rhythm and left atrial foci are clearly evident, with variable  conduction in both situations, resulting in some degree of bradycardia.  The  patient has been largely asymptomatic and so I am not sure how aggressive to  be in changing her medications or undertaking intervention i.e., pacing.   Perhaps an ISA beta blocker would be of benefit here.   Based on the above, I would, therefore:  1. Agree with transfer to Telemetry.  2. May benefit from an ISA beta blocker versus atenolol, but I would have     little problem putting her back on her atenolol as she has been largely     asymptomatic.  3. Serial Holters may be of benefit to elucidate the degree of her     bradycardia.   Thank you for this consultation.                                                Duke Salvia, M.D. [facility]    SCK/MEDQ  D:  06/25/2002  T:  06/25/2002  Job:  [id number]   cc:   Stacie Glaze, M.D. [facility]

## 2010-12-05 NOTE — Procedures (Signed)
. [facility]  Patient:    Megan Olsen, Megan Olsen                      MRN: [id number] Proc. Date: 09/22/00 Adm. Date:  [id number] Attending:  Lewayne Bunting CC:         Dietrich Pates, M.D. [facility]  Stacie Glaze, M.D. [facility]  Kathrine Cords, [facility]   Procedure Report  CLINICAL NOTE:  The patient is a very pleasant 77 year old woman with a long history of tachypalpitations.  She had documented SVT on monitoring and is now referred for catheter ablation.  Of note, the patient has been on beta blocker for several months with continued symptoms.  DESCRIPTION OF PROCEDURE:  After informed consent was obtained, the patient was taken to the diagnostic EP lab in a fasting state.  After the usual preparation and draping, intravenous fentanyl and midazolam were given for sedation.  A 6 French hexapolar catheter was inserted percutaneously in the right jugular vein and advanced to the coronary sinus.  A 5 French quadripolar catheter was inserted subcutaneously in the right femoral vein and advanced to the His bundle region.  A 5 French quadripolar catheter was inserted percutaneously in the right femoral vein and advanced to the right ventricle. A 5 French quadripolar catheter was inserted percutaneously in the right femoral vein and advanced to the right atrium.  After measurement of the basic intervals, mapping was carried out.  The patient had an incessant atrial tachycardia.  The cycle length of the tachycardia was approximately 400 msec, though it did vary between 380 and 420 msec.  Initially the earliest atrial activation was in the distal coronary sinus.  Rapid ventricular pacing was carried out from the RV apex with V-A conduction present at a cycle length of 380 msec.  Following termination of pacing, the patients atrial and ventricular activation sequence was V-A-A-V.  This essentially confirmed the presence of an atrial tachycardia.  Additional pacing  demonstrated no relationship between atrium and ventricle with variable AV conduction.  At this point, the patients atrial septum was probed and with the mapping catheter, a patent foramen ovale was observed.  Having confirmed this, the ablation catheter was maneuvered across the PFO, and mapping was carried out in the left atrium.  Heparin was subsequently given.  Mapping in the left upper and right upper portions of the left atrium demonstrated no particular early atrial activation.  In the same way, mapping along the atrial septum on the left atrial insertion was not particularly early.  The earliest atrial activation was found along the lower lateral portion of the left atrium along the distal coronary sinus electrogram approximately 5 cm from the coronary sinus ostium near the atrial insertion to the mitral valve annulus.  It should be noted that the patient had had a history of a myxomatous mitral valve.  At atrial electrograms that were approximately 5-10 msec presystolic and also slightly earlier than the distal coronary sinus atrial activation, RF energy was delivered.  A total of seven RF energy aplications were delivered in this region, which on the mitral annulus was approximately 4 to 5 oclock.  During RF energy application, the tachycardia would terminate.  Sinus rhythm was restored.  On several occasions, with cessation of the RF energy application, the patients tachycardia would return.  However, during the fourth, fifth, and sixth RF energy application, the patients tachycardia did not return. The patient continued to have fairly rare PACs, both  in pairs and couplets, which were mapped also to the left lower portion of the mitral annulus and left atrium but also at other sites, particularly sites more septal along the mitral annulus.  Because of multiple morphologies of the patients PACs and because there was no additional sustained atrial arrhythmia, no additional  RF energy applications were delivered.  The catheters were then removed, hemostasis ensured, and the patient returned to her room in good condition.  COMPLICATIONS:  There were no immediate procedure complications.  RESULTS:  A. BASELINE ELECTROCARDIOGRAM:  The baseline ECG demonstrates an atrial    tachycardia with variable AV conduction.  It should be noted that the cycle    length of the patients tachycardia was approximately 400 msec.  B. BASIC MEASUREMENTS:  The RR interval averaged 832 msec.  The PR interval    during the tachycardia was 200 msec.  Of course, this represented 2:1 AV    conduction.  The HV interval was approximately 37 msec.  C. RAPID VENTRICULAR PACING:  Rapid ventricular pacing was carried out from    the RV apex during tachycardia, and VA conduction persisted at a cycle    length of 380 msec.  During pacing, the atrial activation sequence was    midline.  D. RAPID ATRIAL PACING:  Rapid atrial pacing did not terminate the    tachycardia; rather, the patient with the cessation of atrial pacing would    have return of the patients atrial tachycardia.  Occasionally there would    be one sinus beat prior to reinitiation of tachycardia.  It should be noted    that the AV conduction was not 1:1 at a cycle length of 400 msec,    demonstrating AV Wenckebach of greater than or equal to 400 msec.  E. ARRHYTHMIAS OBSERVED:  Atrial tachycardia.  Initiation:  Present at time of    EP study.  Duration was incessant with brief pauses followed by    reinitiation.  The cycle length varied between 380 and 410 msec, and the    termination method was with catheter ablation.  F. MAPPING:  Mapping of the patients tachycardia demonstrated earliest atrial    activation to be present approximately 5 oclock along the mitral annulus    on the atrial insertion, very close to the mitral annulus.  At this    location, the atrial activation electrograms were approximately 5-10 msec      earlier than the distal electrograms and approximately 10 msec presystolic.  G. RADIOFREQUENCY APPLICATION:  A total of seven RF energy applications was    delivered to the lower lateral left atrium, down along the mitral annulus.    This resulted in termination of the patients incessant atrial tachycardia.    The patient did have fairly frequent PACs originating from this region as    well as others in the left atrium.  CONCLUSIONS:  This study demonstrates apparent successful catheter ablation in a patient with incessant atrial tachycardia with variable AV conduction. There was also residual atrial ectopy originating from the left atrium.  At this point, because of a remote history of atrial fibrillation, the patient will be restarted on Coumadin and her beta blocker will be reinitiated, and she will be placed on low-dose Tambocor to help in suppressing her residual atrial ectopy. DD:  09/22/00 TD:  09/22/00 Job: [id number]

## 2010-12-05 NOTE — H&P (Signed)
NAME:  Megan Olsen, Megan Olsen                         ACCOUNT NO.:  [id number]   MEDICAL RECORD NO.:  [id number]                   PATIENT TYPE:  EMS   LOCATION:  MAJO                                 FACILITY:  [facility]   PHYSICIAN:  Valetta Mole. Swords, M.D. [facility]           DATE OF BIRTH:  [birth year]   DATE OF ADMISSION:  06/24/2002  DATE OF DISCHARGE:                                HISTORY & PHYSICAL   CHIEF COMPLAINT:  Stroke.   HISTORY OF PRESENT ILLNESS:  The patient is a 77 year-old female who felt  well when she woke up this morning. At approximately 8:00 this morning she  noticed that she could not use her right hand and she could not feel her  right hand.  She then went to the mirror and noticed that she had a marked  right facial droop, tried to call a neighbor and realized she had very  slurred speech.  These symptoms gradually improved over the next one hour.  She is now feeling much better.  She states she feels normal.  She states  that her hand and fingers feel and work normally.  She feels like her speech  is normal.  She denies any headache.  She denies any other neurologic  deficit.  She denies any other associated modifying or associated symptoms.   PAST MEDICAL HISTORY:  1. Neck pain being evaluated for a radiculopathy and it sounds like she has     had recent physical therapy.  2. Recent (March 2002) catheter ablation for an atrial tachycardia.   MEDICATIONS:  1. Estrogen replacement.  2. Atenolol (unknown dose).  3. Mobic once a day.  4. Muscle relaxer.   SOCIAL HISTORY:  She does not smoke.  She lives alone.  She does not drink  alcohol.   FAMILY HISTORY:  Father deceased with a MI at 20.  Mother deceased with  nephritis at a young age.   REVIEW OF SYMPTOMS:  CARDIAC:  She denies any chest pain, shortness of  breath, PND or orthopnea.  She denies any other complaints in the review of  systems.   PHYSICAL EXAMINATION:  VITAL SIGNS:  Blood pressure 100/70, pulse 70  and  irregular, respirations 14.  GENERAL:  She appears as a well developed, well nourished female in no acute  distress.  HEENT:  Normocephalic, atraumatic.  Extraocular muscles are intact.  The  neck is supple without lymphadenopathy or thyromegaly.  Cranial nerves are  intact except that she is noted to have a right facial droop with grimacing.  CHEST:  Clear to auscultation without any increased work of breathing.  CARDIAC:  S1 and S2, irregular, without any significant murmurs or gallops.  PMI is  normal.  ABDOMEN:  Active bowel sounds, soft and nontender.  There is no  hepatosplenomegaly.  No masses are palpated.  NEUROLOGIC:  She is alert and oriented.  There are no motor deficits  with  extensive testing. No deficits noted of the right upper extremity.  Deep  tendon reflexes are intact and symmetric bilaterally.  She does have the  mild right facial droop as noted above.   EKG demonstrates an irregular rhythm, possibly an ectopic atrial rhythm, but  could be atrial fibrillation.   LABORATORY DATA:  CBC is normal. ProTime is normal and INR is 0.9.  I-stat  is normal and a normal creatinine of 0.9.   CT scan demonstrates a possible old lacunar infarct.   ASSESSMENT AND PLAN:  1. Presumed stroke in a relatively healthy lady with a history of     tachyarrhythmia.  I am unclear of the EKG, but it certainly is not sinus     rhythm.  She may intermittently go in and out of atrial fibrillation. I     think given her history and recent stroke, she needs to be     anticoagulated.  I do not think aspirin or Plavix are good enough given     her history.  She needs to be started on Coumadin and we will also     anticoagulate with Lovenox in the meantime.  We will also discontinue her     estrogen.                                               Bruce Rexene Edison Swords, M.D. [facility]    BHS/MEDQ  D:  06/24/2002  T:  06/24/2002  Job:  [id number]   cc:   Stacie Glaze, M.D. [facility]

## 2010-12-05 NOTE — Discharge Summary (Signed)
NAME:  Megan Olsen, DAHAN                         ACCOUNT NO.:  [id number]   MEDICAL RECORD NO.:  [id number]                   PATIENT TYPE:   LOCATION:  2012                                 FACILITY:  [facility]   PHYSICIAN:  Stacie Glaze, M.D. [facility]           DATE OF BIRTH:  Oct 24, 1927   DATE OF ADMISSION:  06/24/2002  DATE OF DISCHARGE:  06/29/2002                                 DISCHARGE SUMMARY   DISCHARGE DIAGNOSES:  1. Right-sided hemiparesis.  2. Small left acute cortical infarct.  3. Embolic shower.  4. Small vessel disease.  5. Venous hemangioma.  6. Tachybrady syndrome.  7. Atrial tachycardia.  8. Hypercholesterolemia.   BRIEF ADMISSION HISTORY:  The patient is a 77 year old white female who on  the morning of admission woke up noting that she could not use her right  hand.  She could not feel her right hand.  She noted she had a right facial  droop and slurred speech.  Over the next hour, those symptoms improved and  normalized.   PAST MEDICAL HISTORY:  1. Neck and left shoulder pain currently being evaluated for radiculopathy     going to outpatient physical therapy.  2. History of  atrial tachycardia status post catheter ablation in 3/02.   HOSPITAL COURSE:  1. Neuro.  The patient comes in with right-sided symptoms consistent with a     left CVA.  The patient  was admitted for further evaluation.  Carotid     Dopplers were obtained and showed no evidence of ICA stenosis     bilaterally.  2-D echo revealed preserved LV function without wall motion     abnormalities and no evidence of embolus.  MRI of the brain was obtained     revealing small acute cortical infarctions involving the left posterior     insular cortex and left upper frontoparietal cortex.  These are assumed     to be consistent with an embolic shower.  It was presumed to be a cardiac     source secondary to her atrial arrhythmia.  The patient  had known small     vessel disease as well.  The patient  was anticoagulated with heparin and     Coumadin.  As noted, the patient's symptoms resolved and there is no     indication for continued outpatient PT or OT in regards to her CVA.  The     patient is currently therapeutic on Coumadin.   1. Cardiovascular.  As noted, the patient has a history of a left atrial     tachycardia status post RFCA in 3/02.  The patient's beta blocker was     held on admission.  Subsequently, she developed recurrent atrial     tachycardia.  The patient's atenolol was resumed on 06/27/02 and she then     developed bradycardia with heart rate into the 30s.  The patient was  followed by Dr. Clide Cliff and Ladona Ridgel.  Dr. Ladona Ridgel felt she had a tachybrady     syndrome, but was asymptomatic.  He made recommendations to continue her     atenolol.  He started her on flecainide.  He recommended continuing     Coumadin until therapeutic.  He did not feel pacemaker was indicated at     this time.  However, he did want to follow up with the patient in two     months for SVT ablation.  The patient is currently stable and she has not     had any further arrhythmias.   1. Hypercholesterolemia.   1. Neck and left shoulder pain.  We will have the patient  resume her     physical therapy as an outpatient.   LABS AT DISCHARGE:  Pro time was 21.8.  INR was 2.2.  CBC was normal.  Total  cholesterol was 223.  Triglycerides were 120. HDL 96, LDL 103.  TSH was 4.5.  Urinalysis was negative.   MEDICATIONS AT DISCHARGE:  1. Estradiol 2 mg q.d.  2. Atenolol 25 mg q.h.s.  3. Flecainide 50 mg b.i.d.  4. Coumadin 2 mg 1 1/2 tablets q.d.  5. Ativan and Mobic as at home.   Followup with Dr. Darryll Capers 12/16 at 12 noon.  He will also check a pro  time at that time.  Followup with Dr. Ladona Ridgel as instructed.       Cornell Barman, P.A. [facility]                  Stacie Glaze, M.D. [facility]    LC/MEDQ  D:  06/29/2002  T:  06/29/2002  Job:  [id number]   cc:   Stacie Glaze, M.D. [facility]   Doylene Canning.  Ladona Ridgel, M.D. [facility]  [address]  [place]  [place] [address]  Fax: 1

## 2010-12-05 NOTE — Discharge Summary (Signed)
[place]  Patient:    Megan Olsen, Megan Olsen                      MRN: [id number] Adm. Date:  [id number] Disc. Date: [id number] Attending:  Lewayne Bunting Dictator:   Chinita Pester, CRNP CC:         Stacie Glaze, M.D. [facility], ______ Joselyn Arrow. Ladona Ridgel, M.D. [facility]   Discharge Summary  PRIMARY DIAGNOSIS:  Supraventricular tachycardia.  SECONDARY DIAGNOSIS:  First-degree and second-degree heart block.  HOSPITAL COURSE:  Patient was admitted and underwent EP study radiofrequency ablation of her left atrial tachycardia without intermittent complications. She tolerated the procedure well.  She was to be continued on her Coumadin and atenolol, aspirin, and antibiotic prophylaxis.  She had no postop complications although her heart rhythm remains in a first-degree AV block upon discharge.  Patient was discharged on the following medications.  DISCHARGE MEDICATIONS: 1. Coumadin 3 mg nightly. 2. Vioxx 25 mg daily as needed. 3. Aciphex 200 as needed. 4. Atenolol 25 mg twice a day. 5. Calcium daily as before. 6. Enteric-coated aspirin 81 mg two tablets once a day.  DISCHARGE INSTRUCTIONS:  She was to take antibiotics prior to any dental work for the next three months.  Light activity for four days.  No driving for 48 hours.  ACTIVITY:  Light.  DIET:  Low-fat, low-cholesterol diet.  WOUND CARE:  She is to keep her incision clean and dry.  She was allowed to shower.  FOLLOWUP:  She was to have a PT/INR done on Monday at ______ and she was to call for an appointment for that PT/INR.  Patient was to have a Holter monitor placed, the office will call her to set up the appointment, and she is also to follow up with Dr. Ladona Ridgel in three weeks and the office will call as well to set up that appointment. DD:  09/23/00 TD:  09/23/00 Job: [id number]

## 2010-12-22 ENCOUNTER — Ambulatory Visit (INDEPENDENT_AMBULATORY_CARE_PROVIDER_SITE_OTHER): Payer: Medicare PPO | Admitting: Internal Medicine

## 2010-12-22 DIAGNOSIS — I4891 Unspecified atrial fibrillation: Secondary | ICD-10-CM

## 2010-12-22 LAB — POCT INR: INR: 3.3

## 2010-12-22 NOTE — Patient Instructions (Signed)
3 mg everyday,1.5mg  on Wednesday

## 2010-12-23 ENCOUNTER — Encounter: Payer: Self-pay | Admitting: Internal Medicine

## 2010-12-23 NOTE — Progress Notes (Signed)
  Subjective:    Patient ID: Megan Olsen, female    DOB: 10/22/1927, 77 y.o.   MRN: [id number]  HPI This is a 77-year-old white female with a history of atrial fibrillation who presents after changing her atenolol 20 to ziac due to a combination of factors which include mild lower extent the edema well as a feeling of weakness on the atenolol.  When she was changed for status "nervousness which I suspect was a change in the nature of the beta blockade.  But her blood pressure is well controlled her heart rate is well controlled on the celiac at this time her edema has resolved her lower extremities   Review of Systems  Constitutional: Negative for activity change, appetite change and fatigue.  HENT: Negative for ear pain, congestion, neck pain, postnasal drip and sinus pressure.   Eyes: Negative for redness and visual disturbance.  Respiratory: Negative for cough, shortness of breath and wheezing.   Gastrointestinal: Negative for abdominal pain and abdominal distention.  Genitourinary: Negative for dysuria, frequency and menstrual problem.  Musculoskeletal: Negative for myalgias, joint swelling and arthralgias.  Skin: Negative for rash and wound.  Neurological: Negative for dizziness, weakness and headaches.  Hematological: Negative for adenopathy. Does not bruise/bleed easily.  Psychiatric/Behavioral: Negative for sleep disturbance and decreased concentration.       Objective:   Physical Exam  Constitutional: She appears well-developed and well-nourished.  HENT:  Head: Normocephalic and atraumatic.  Eyes: Conjunctivae are normal. Pupils are equal, round, and reactive to light.  Neck: Normal range of motion. Neck supple.  Cardiovascular:       Irregular rhythm an irregular rate in 70s  Pulmonary/Chest: Effort normal and breath sounds normal.          Assessment & Plan:  Atrial fibrillation with controlled rate as well as good blood pressure control  and resolution of mild  edema continues he had followup 3 months

## 2011-01-05 ENCOUNTER — Other Ambulatory Visit: Payer: Self-pay | Admitting: *Deleted

## 2011-01-05 MED ORDER — LORAZEPAM 1 MG PO TABS: 0.5000 mg | ORAL_TABLET | Freq: Every day | ORAL | Status: DC

## 2011-01-14 ENCOUNTER — Other Ambulatory Visit: Payer: Self-pay | Admitting: Internal Medicine

## 2011-01-22 ENCOUNTER — Ambulatory Visit (INDEPENDENT_AMBULATORY_CARE_PROVIDER_SITE_OTHER): Payer: Medicare PPO

## 2011-01-22 DIAGNOSIS — I4891 Unspecified atrial fibrillation: Secondary | ICD-10-CM

## 2011-01-22 LAB — POCT INR: INR: 2.7

## 2011-01-22 NOTE — Patient Instructions (Signed)
3 mg everyday,1.5mg on Wednesday 

## 2011-02-16 ENCOUNTER — Other Ambulatory Visit: Payer: Self-pay | Admitting: Internal Medicine

## 2011-02-23 ENCOUNTER — Encounter: Payer: Self-pay | Admitting: Internal Medicine

## 2011-02-23 ENCOUNTER — Ambulatory Visit (INDEPENDENT_AMBULATORY_CARE_PROVIDER_SITE_OTHER): Payer: Medicare PPO | Admitting: Internal Medicine

## 2011-02-23 VITALS — BP 122/76 | HR 72 | Temp 98.4°F | Ht 67.0 in | Wt 142.0 lb

## 2011-02-23 DIAGNOSIS — R252 Cramp and spasm: Secondary | ICD-10-CM

## 2011-02-23 DIAGNOSIS — Z7901 Long term (current) use of anticoagulants: Secondary | ICD-10-CM

## 2011-02-23 DIAGNOSIS — I4891 Unspecified atrial fibrillation: Secondary | ICD-10-CM

## 2011-02-23 DIAGNOSIS — Z5181 Encounter for therapeutic drug level monitoring: Secondary | ICD-10-CM

## 2011-02-23 LAB — POCT INR: INR: 1.9

## 2011-02-23 MED ORDER — ASPIRIN 325 MG PO TABS: 325.0000 mg | ORAL_TABLET | Freq: Every day | ORAL | Status: AC

## 2011-02-23 NOTE — Progress Notes (Signed)
Subjective:    Patient ID: Megan Olsen, female    DOB: 07/04/1928, 77 y.o.   MRN: [id number]  HPI Doing well except for leg cramps an feet cramps at night. She is on coumadin for atrial fibrillation. She wants to come off coumadin She feels tat the coumadin is causing the cramping and the bruising disucscsed the risk and benefit of stopping the coumadin. The pt has been asked to move again due to family issues.    Review of Systems  Constitutional: Negative for activity change, appetite change and fatigue.  HENT: Negative for ear pain, congestion, neck pain, postnasal drip and sinus pressure.   Eyes: Negative for redness and visual disturbance.  Respiratory: Negative for cough, shortness of breath and wheezing.   Gastrointestinal: Negative for abdominal pain and abdominal distention.  Genitourinary: Negative for dysuria, frequency and menstrual problem.  Musculoskeletal: Negative for myalgias, joint swelling and arthralgias.  Skin: Negative for rash and wound.  Neurological: Negative for dizziness, weakness and headaches.  Hematological: Negative for adenopathy. Does not bruise/bleed easily.  Psychiatric/Behavioral: Negative for sleep disturbance and decreased concentration.    Past Medical History  Diagnosis Date  . GERD (gastroesophageal reflux disease)   . Depression   . Osteoporosis   . Atrial fibrillation    Past Surgical History  Procedure Date  . Cholecystectomy   . Abdominal hysterectomy   . Total abdominal hysterectomy     reports that she has never smoked. She does not have any smokeless tobacco history on file. She reports that she does not drink alcohol or use illicit drugs. family history is not on file. Allergies  Allergen Reactions  . Celecoxib   . Fexofenadine     REACTION: Rash  . Penicillins     REACTION: Hives rash  . Propoxyphene N-Acetaminophen     REACTION: nausia       Objective:   Physical Exam  Nursing note and vitals  reviewed. Constitutional: She is oriented to person, place, and time. She appears well-developed and well-nourished. No distress.  HENT:  Head: Normocephalic and atraumatic.  Right Ear: External ear normal.  Left Ear: External ear normal.  Nose: Nose normal.  Mouth/Throat: Oropharynx is clear and moist.  Eyes: Conjunctivae and EOM are normal. Pupils are equal, round, and reactive to light.  Neck: Normal range of motion. Neck supple. No JVD present. No tracheal deviation present. No thyromegaly present.  Cardiovascular: Normal rate, regular rhythm, normal heart sounds and intact distal pulses.   No murmur heard. Pulmonary/Chest: Effort normal and breath sounds normal. She has no wheezes. She exhibits no tenderness.  Abdominal: Soft. Bowel sounds are normal.  Musculoskeletal: Normal range of motion. She exhibits no edema and no tenderness.  Lymphadenopathy:    She has no cervical adenopathy.  Neurological: She is alert and oriented to person, place, and time. She has normal reflexes. No cranial nerve deficit.  Skin: Skin is warm and dry. She is not diaphoretic.  Psychiatric: She has a normal mood and affect. Her behavior is normal.          Assessment & Plan:  Patient has perceived side effects of Coumadin including cramping in her legs and she wishes to discontinue the Coumadin.  She is on a very low dose of Coumadin and her heart rate has been very stable.  She is 77 years of age and has increased risk from Coumadin use therefore after weighing the risks and benefits she wishes to try off the Coumadin and to  use aspirin only as an anticoagulant we will start her on 325 mg of aspirin and monitor her

## 2011-04-24 ENCOUNTER — Ambulatory Visit (INDEPENDENT_AMBULATORY_CARE_PROVIDER_SITE_OTHER): Payer: Medicare PPO | Admitting: Internal Medicine

## 2011-04-24 VITALS — BP 136/72 | HR 60 | Temp 98.2°F | Resp 16 | Ht 65.0 in | Wt 139.0 lb

## 2011-04-24 DIAGNOSIS — F341 Dysthymic disorder: Secondary | ICD-10-CM

## 2011-04-24 DIAGNOSIS — M19079 Primary osteoarthritis, unspecified ankle and foot: Secondary | ICD-10-CM

## 2011-04-24 DIAGNOSIS — I10 Essential (primary) hypertension: Secondary | ICD-10-CM

## 2011-04-24 DIAGNOSIS — Z23 Encounter for immunization: Secondary | ICD-10-CM

## 2011-04-24 DIAGNOSIS — F419 Anxiety disorder, unspecified: Secondary | ICD-10-CM

## 2011-04-24 NOTE — Patient Instructions (Signed)
Patient was instructed to continue all medications as prescribed. To stop at the checkout desk and schedule a followup appointment  

## 2011-05-05 LAB — CBC
HCT: 42
Platelets: 222
WBC: 7.7

## 2011-05-05 LAB — BASIC METABOLIC PANEL
BUN: 12
Calcium: 9
GFR calc non Af Amer: >60
Potassium: 4
Sodium: 140

## 2011-05-05 LAB — PROTIME-INR: INR: 2 — ABNORMAL HIGH

## 2011-05-20 ENCOUNTER — Other Ambulatory Visit: Payer: Self-pay | Admitting: Internal Medicine

## 2011-07-13 ENCOUNTER — Other Ambulatory Visit: Payer: Self-pay | Admitting: Internal Medicine

## 2011-07-20 ENCOUNTER — Encounter: Payer: Self-pay | Admitting: Internal Medicine

## 2011-07-20 NOTE — Progress Notes (Signed)
Subjective:    Patient ID: Megan Olsen, female    DOB: [birth year], 77 y.o.   MRN: [id number]  HPI Patient is an 77 year old white female followed for atrophic ablation gastroesophageal reflux and mild to moderate depression.  She is in an fortunate situation where her family members would ask her to take care of a home have now asked her to vacate at home and she is to change to an apartment. Stress of the move and her age it is difficult and she is very anxious over the situation.  She has noticed some increased palpitations but no increased edema or shortness of breath. She has increased arthritic pain with a history of polymyalgia rheumatica but the pain appears to be more localized in the low back and in the right foot  She has no current symptoms of acute diastolic heart failure and is stable on her current medications   Review of Systems  Constitutional: Negative for activity change, appetite change and fatigue.  HENT: Negative for ear pain, congestion, neck pain, postnasal drip and sinus pressure.   Eyes: Negative for redness and visual disturbance.  Respiratory: Negative for cough, shortness of breath and wheezing.   Gastrointestinal: Negative for abdominal pain and abdominal distention.  Genitourinary: Negative for dysuria, frequency and menstrual problem.  Musculoskeletal: Negative for myalgias, joint swelling and arthralgias.  Skin: Negative for rash and wound.  Neurological: Negative for dizziness, weakness and headaches.  Hematological: Negative for adenopathy. Does not bruise/bleed easily.  Psychiatric/Behavioral: Negative for sleep disturbance and decreased concentration.   Past Medical History  Diagnosis Date  . GERD (gastroesophageal reflux disease)   . Depression   . Osteoporosis   . Atrial fibrillation     History   Social History  . Marital Status: Single    Spouse Name: N/A    Number of Children: N/A  . Years of Education: N/A   Occupational History   . Not on file.   Social History Main Topics  . Smoking status: Never Smoker   . Smokeless tobacco: Not on file  . Alcohol Use: No  . Drug Use: No  . Sexually Active: No   Other Topics Concern  . Not on file   Social History Narrative  . No narrative on file    Past Surgical History  Procedure Date  . Cholecystectomy   . Abdominal hysterectomy   . Total abdominal hysterectomy     No family history on file.  Allergies  Allergen Reactions  . Celecoxib   . Fexofenadine     REACTION: Rash  . Penicillins     REACTION: Hives rash  . Propoxyphene N-Acetaminophen     REACTION: nausia    Current Outpatient Prescriptions on File Prior to Visit  Medication Sig Dispense Refill  . aspirin 325 MG tablet Take 1 tablet (325 mg total) by mouth daily.      . bisoprolol-hydrochlorothiazide (ZIAC) 5-6.25 MG per tablet Take 1 tablet by mouth daily.  30 tablet  11  . Cholecalciferol (VITAMIN D) 1000 UNITS capsule Take 1,000 Units by mouth daily.        Marland Kitchen etodolac (LODINE) 300 MG capsule TAKE ONE CAPSULE BY MOUTH TWICE A DAY  60 capsule  4  . fish oil-omega-3 fatty acids 1000 MG capsule Take 1 g by mouth daily.        . furosemide (LASIX) 20 MG tablet Take 20 mg by mouth as needed. As needed for swelling in your legs       .  Multiple Vitamins-Minerals (ICAPS MV) TABS Take by mouth 2 (two) times daily.          BP 136/72  Pulse 60  Temp 98.2 F (36.8 C)  Resp 16  Ht 5\' 5"  (1.651 m)  Wt 139 lb (63.05 kg)  BMI 23.13 kg/m2       Objective:   Physical Exam  Nursing note and vitals reviewed. Constitutional: She is oriented to person, place, and time. She appears well-developed and well-nourished. No distress.  HENT:  Head: Normocephalic and atraumatic.  Right Ear: External ear normal.  Left Ear: External ear normal.  Nose: Nose normal.  Mouth/Throat: Oropharynx is clear and moist.  Eyes: Conjunctivae and EOM are normal. Pupils are equal, round, and reactive to light.  Neck:  Normal range of motion. Neck supple. No JVD present. No tracheal deviation present. No thyromegaly present.  Cardiovascular: Normal rate, regular rhythm, normal heart sounds and intact distal pulses.   No murmur heard. Pulmonary/Chest: Effort normal and breath sounds normal. She has no wheezes. She exhibits no tenderness.  Abdominal: Soft. Bowel sounds are normal.  Musculoskeletal: Normal range of motion. She exhibits no edema and no tenderness.  Lymphadenopathy:    She has no cervical adenopathy.  Neurological: She is alert and oriented to person, place, and time. She has normal reflexes. No cranial nerve deficit.  Skin: Skin is warm and dry. She is not diaphoretic.  Psychiatric: She has a normal mood and affect. Her behavior is normal.          Assessment & Plan:  Her blood pressure is stable on her current medications refills were given.  She was instructed to take the Lodine twice daily for her arthritic pain if any increased edema occurs while taking the Lodine or if she has dark or tarry stools she was instructed to stop the medication immediately and contact our office.  She was encouraged to exercise on a regular basis walking at least 2000 steps a day. She was offered a referral to a counselor to discuss the anxiety and depression over how she is treated by her family  She is given Samples for her GERD

## 2011-07-27 ENCOUNTER — Ambulatory Visit: Payer: Medicare PPO | Admitting: Internal Medicine

## 2011-08-03 ENCOUNTER — Telehealth: Payer: Self-pay | Admitting: *Deleted

## 2011-08-03 MED ORDER — LORAZEPAM 1 MG PO TABS: ORAL_TABLET | ORAL | Status: DC

## 2011-08-03 NOTE — Telephone Encounter (Signed)
Done

## 2011-08-28 ENCOUNTER — Ambulatory Visit (INDEPENDENT_AMBULATORY_CARE_PROVIDER_SITE_OTHER): Payer: Medicare PPO | Admitting: Internal Medicine

## 2011-08-28 ENCOUNTER — Encounter: Payer: Self-pay | Admitting: Internal Medicine

## 2011-08-28 VITALS — BP 130/80 | HR 72 | Temp 98.3°F | Resp 16 | Ht 67.0 in | Wt 146.0 lb

## 2011-08-28 DIAGNOSIS — I4891 Unspecified atrial fibrillation: Secondary | ICD-10-CM

## 2011-08-28 DIAGNOSIS — J309 Allergic rhinitis, unspecified: Secondary | ICD-10-CM

## 2011-08-28 DIAGNOSIS — J300 Vasomotor rhinitis: Secondary | ICD-10-CM | POA: Insufficient documentation

## 2011-08-28 DIAGNOSIS — I5031 Acute diastolic (congestive) heart failure: Secondary | ICD-10-CM

## 2011-08-28 MED ORDER — FLUTICASONE PROPIONATE 50 MCG/ACT NA SUSP: 2.0000 | Freq: Two times a day (BID) | NASAL | Status: DC

## 2011-08-28 NOTE — Patient Instructions (Signed)
The patient is instructed to continue all medications as prescribed. Schedule followup with check out clerk upon leaving the clinic  

## 2011-08-28 NOTE — Progress Notes (Signed)
  Subjective:    Patient ID: Megan Olsen, female    DOB: Jun 20, 1928, 78 y.o.   MRN: [id number]  HPI Patient presents for followup of atrial fibrillation gastroesophageal reflux and for depression.  [name] been under increased stress and she had a forced move out of the home she was sent to an apartment  She is now in a senior apartment setting.  She states she feels generally well she has some problems with sleep In a primary complaint of vasomotor rhinitis. She states that her nose runs with eating and often is running in the morning she states that sometimes it runs so severely she feels like she is "drowning"   Review of Systems  Constitutional: Negative for activity change, appetite change and fatigue.  HENT: Negative for ear pain, congestion, neck pain, postnasal drip and sinus pressure.   Eyes: Negative for redness and visual disturbance.  Respiratory: Negative for cough, shortness of breath and wheezing.   Gastrointestinal: Negative for abdominal pain and abdominal distention.  Genitourinary: Negative for dysuria, frequency and menstrual problem.  Musculoskeletal: Negative for myalgias, joint swelling and arthralgias.  Skin: Negative for rash and wound.  Neurological: Negative for dizziness, weakness and headaches.  Hematological: Negative for adenopathy. Does not bruise/bleed easily.  Psychiatric/Behavioral: Negative for sleep disturbance and decreased concentration.       Objective:   Physical Exam  Nursing note and vitals reviewed. Constitutional: She is oriented to person, place, and time. She appears well-developed and well-nourished. No distress.  HENT:  Head: Normocephalic and atraumatic.  Right Ear: External ear normal.  Left Ear: External ear normal.  Nose: Nose normal.  Mouth/Throat: Oropharynx is clear and moist.  Eyes: Conjunctivae and EOM are normal. Pupils are equal, round, and reactive to light.  Neck: Normal range of motion. Neck supple. No JVD  present. No tracheal deviation present. No thyromegaly present.  Cardiovascular: Normal rate, regular rhythm, normal heart sounds and intact distal pulses.   No murmur heard. Pulmonary/Chest: Effort normal and breath sounds normal. She has no wheezes. She exhibits no tenderness.  Abdominal: Soft. Bowel sounds are normal.  Musculoskeletal: Normal range of motion. She exhibits no edema and no tenderness.  Lymphadenopathy:    She has no cervical adenopathy.  Neurological: She is alert and oriented to person, place, and time. She has normal reflexes. No cranial nerve deficit.  Skin: Skin is warm and dry. She is not diaphoretic.  Psychiatric: She has a normal mood and affect. Her behavior is normal.          Assessment & Plan:  Patient'S atrial fibrillation appears to be stable on her current medications.  She has no excessive shortness of breath PND orthopnea. Her gastroesophageal reflux appears to be stable  Severe vasomotor rhinitis we will start with Claritin one tablet by mouth twice a day and Flonase 2 sprays in each nostril twice a day until symptoms are controlled then we'll back off to daily

## 2011-08-31 ENCOUNTER — Other Ambulatory Visit: Payer: Self-pay | Admitting: *Deleted

## 2011-08-31 MED ORDER — ETODOLAC 300 MG PO CAPS: 300.0000 mg | ORAL_CAPSULE | Freq: Two times a day (BID) | ORAL | Status: DC

## 2011-09-29 ENCOUNTER — Other Ambulatory Visit: Payer: Self-pay | Admitting: Internal Medicine

## 2011-10-21 ENCOUNTER — Other Ambulatory Visit: Payer: Self-pay | Admitting: *Deleted

## 2011-10-21 DIAGNOSIS — J300 Vasomotor rhinitis: Secondary | ICD-10-CM

## 2011-10-21 MED ORDER — FLUTICASONE PROPIONATE 50 MCG/ACT NA SUSP: 2.0000 | Freq: Two times a day (BID) | NASAL | Status: DC

## 2011-10-21 MED ORDER — ETODOLAC 300 MG PO CAPS: 300.0000 mg | ORAL_CAPSULE | Freq: Two times a day (BID) | ORAL | Status: DC

## 2011-10-21 MED ORDER — BISOPROLOL-HYDROCHLOROTHIAZIDE 5-6.25 MG PO TABS: 1.0000 | ORAL_TABLET | Freq: Every day | ORAL | Status: DC

## 2011-10-21 MED ORDER — DEXLANSOPRAZOLE 60 MG PO CPDR: 60.0000 mg | DELAYED_RELEASE_CAPSULE | Freq: Every day | ORAL | Status: DC

## 2011-12-28 ENCOUNTER — Ambulatory Visit (INDEPENDENT_AMBULATORY_CARE_PROVIDER_SITE_OTHER): Payer: Medicare PPO | Admitting: Internal Medicine

## 2011-12-28 ENCOUNTER — Encounter: Payer: Self-pay | Admitting: Internal Medicine

## 2011-12-28 VITALS — BP 110/70 | HR 60 | Temp 98.0°F | Resp 16 | Ht 66.0 in | Wt 144.0 lb

## 2011-12-28 DIAGNOSIS — G2581 Restless legs syndrome: Secondary | ICD-10-CM

## 2011-12-28 DIAGNOSIS — K219 Gastro-esophageal reflux disease without esophagitis: Secondary | ICD-10-CM

## 2011-12-28 DIAGNOSIS — I4891 Unspecified atrial fibrillation: Secondary | ICD-10-CM

## 2011-12-28 MED ORDER — MAGNESIUM OXIDE 400 MG PO TABS: 400.0000 mg | ORAL_TABLET | Freq: Every day | ORAL | Status: AC

## 2011-12-28 NOTE — Progress Notes (Signed)
Subjective:    Patient ID: Megan Olsen, female    DOB: [birth year], 78 y.o.   MRN: [id number]  HPI Has a lot of issues with GI tract and has tried to change her food pattern off of bread and sweets and this has helped some Has been walking Has had stress with move AF stable    Review of Systems  Constitutional: Negative for activity change, appetite change and fatigue.  HENT: Negative for ear pain, congestion, neck pain, postnasal drip and sinus pressure.   Eyes: Negative for redness and visual disturbance.  Respiratory: Negative for cough, shortness of breath and wheezing.   Gastrointestinal: Negative for abdominal pain and abdominal distention.  Genitourinary: Negative for dysuria, frequency and menstrual problem.  Musculoskeletal: Negative for myalgias, joint swelling and arthralgias.  Skin: Negative for rash and wound.  Neurological: Negative for dizziness, weakness and headaches.  Hematological: Negative for adenopathy. Does not bruise/bleed easily.  Psychiatric/Behavioral: Negative for sleep disturbance and decreased concentration.   Past Medical History  Diagnosis Date  . GERD (gastroesophageal reflux disease)   . Depression   . Osteoporosis   . Atrial fibrillation     History   Social History  . Marital Status: Single    Spouse Name: N/A    Number of Children: N/A  . Years of Education: N/A   Occupational History  . Not on file.   Social History Main Topics  . Smoking status: Never Smoker   . Smokeless tobacco: Not on file  . Alcohol Use: No  . Drug Use: No  . Sexually Active: No   Other Topics Concern  . Not on file   Social History Narrative  . No narrative on file    Past Surgical History  Procedure Date  . Cholecystectomy   . Abdominal hysterectomy   . Total abdominal hysterectomy     No family history on file.  Allergies  Allergen Reactions  . Celecoxib   . Fexofenadine     REACTION: Rash  . Penicillins     REACTION: Hives rash   . Propoxyphene-Acetaminophen     REACTION: nausia    Current Outpatient Prescriptions on File Prior to Visit  Medication Sig Dispense Refill  . aspirin 325 MG tablet Take 1 tablet (325 mg total) by mouth daily.      . bisoprolol-hydrochlorothiazide (ZIAC) 5-6.25 MG per tablet Take 1 tablet by mouth daily.  90 tablet  3  . Cholecalciferol (VITAMIN D) 1000 UNITS capsule Take 1,000 Units by mouth daily.        Marland Kitchen dexlansoprazole (DEXILANT) 60 MG capsule Take 1 capsule (60 mg total) by mouth daily.  90 capsule  3  . estradiol (ESTRACE) 1 MG tablet TAKE 1 TABLET EVERY DAY  90 tablet  2  . etodolac (LODINE) 300 MG capsule Take 1 capsule (300 mg total) by mouth 2 (two) times daily.  180 capsule  1  . fish oil-omega-3 fatty acids 1000 MG capsule Take 1 g by mouth daily.        . fluticasone (FLONASE) 50 MCG/ACT nasal spray Place 2 sprays into the nose 2 (two) times daily.  16 g  6  . furosemide (LASIX) 20 MG tablet Take 20 mg by mouth as needed. As needed for swelling in your legs       . LORazepam (ATIVAN) 1 MG tablet 1/2 at midday and 1 at bedtime  60 tablet  5  . Multiple Vitamins-Minerals (ICAPS MV) TABS Take by mouth 2 (  two) times daily.        . Turmeric 500 MG CAPS Take by mouth daily.        BP 110/70  Pulse 60  Temp 98 F (36.7 C)  Resp 16  Ht 5\' 6"  (1.676 m)  Wt 144 lb (65.318 kg)  BMI 23.24 kg/m2        Objective:   Physical Exam  Nursing note and vitals reviewed. Constitutional: She is oriented to person, place, and time. She appears well-developed and well-nourished. No distress.  HENT:  Head: Normocephalic and atraumatic.  Right Ear: External ear normal.  Left Ear: External ear normal.  Nose: Nose normal.  Mouth/Throat: Oropharynx is clear and moist.  Eyes: Conjunctivae and EOM are normal. Pupils are equal, round, and reactive to light.  Neck: Normal range of motion. Neck supple. No JVD present. No tracheal deviation present. No thyromegaly present.    Cardiovascular: Normal rate, regular rhythm, normal heart sounds and intact distal pulses.   No murmur heard. Pulmonary/Chest: Effort normal and breath sounds normal. She has no wheezes. She exhibits no tenderness.  Abdominal: Soft. Bowel sounds are normal.  Musculoskeletal: Normal range of motion. She exhibits no edema and no tenderness.  Lymphadenopathy:    She has no cervical adenopathy.  Neurological: She is alert and oriented to person, place, and time. She has normal reflexes. No cranial nerve deficit.  Skin: Skin is warm and dry. She is not diaphoretic.  Psychiatric: She has a normal mood and affect. Her behavior is normal.          Assessment & Plan:

## 2011-12-28 NOTE — Patient Instructions (Addendum)
Look up "practical paleo"  Or ask at [employer]  Insomnia Insomnia is frequent trouble falling and/or staying asleep. Insomnia can be a long term problem or a short term problem. Both are common. Insomnia can be a short term problem when the wakefulness is related to a certain stress or worry. Long term insomnia is often related to ongoing stress during waking hours and/or poor sleeping habits. Overtime, sleep deprivation itself can make the problem worse. Every little thing feels more severe because you are overtired and your ability to cope is decreased. CAUSES   Stress, anxiety, and depression.   Poor sleeping habits.   Distractions such as TV in the bedroom.   Naps close to bedtime.   Engaging in emotionally charged conversations before bed.   Technical reading before sleep.   Alcohol and other sedatives. They may make the problem worse. They can hurt normal sleep patterns and normal dream activity.   Stimulants such as caffeine for several hours prior to bedtime.   Pain syndromes and shortness of breath can cause insomnia.   Exercise late at night.   Changing time zones may cause sleeping problems (jet lag).  It is sometimes helpful to have someone observe your sleeping patterns. They should look for periods of not breathing during the night (sleep apnea). They should also look to see how long those periods last. If you live alone or observers are uncertain, you can also be observed at a sleep clinic where your sleep patterns will be professionally monitored. Sleep apnea requires a checkup and treatment. Give your caregivers your medical history. Give your caregivers observations your family has made about your sleep.  SYMPTOMS   Not feeling rested in the morning.   Anxiety and restlessness at bedtime.   Difficulty falling and staying asleep.  TREATMENT   Your caregiver may prescribe treatment for an underlying medical disorders. Your caregiver can give advice or help if you  are using alcohol or other drugs for self-medication. Treatment of underlying problems will usually eliminate insomnia problems.   Medications can be prescribed for short time use. They are generally not recommended for lengthy use.   Over-the-counter sleep medicines are not recommended for lengthy use. They can be habit forming.   You can promote easier sleeping by making lifestyle changes such as:   Using relaxation techniques that help with breathing and reduce muscle tension.   Exercising earlier in the day.   Changing your diet and the time of your last meal. No night time snacks.   Establish a regular time to go to bed.   Counseling can help with stressful problems and worry.   Soothing music and white noise may be helpful if there are background noises you cannot remove.   Stop tedious detailed work at least one hour before bedtime.  HOME CARE INSTRUCTIONS   Keep a diary. Inform your caregiver about your progress. This includes any medication side effects. See your caregiver regularly. Take note of:   Times when you are asleep.   Times when you are awake during the night.   The quality of your sleep.   How you feel the next day.  This information will help your caregiver care for you.  Get out of bed if you are still awake after 15 minutes. Read or do some quiet activity. Keep the lights down. Wait until you feel sleepy and go back to bed.   Keep regular sleeping and waking hours. Avoid naps.   Exercise regularly.  Avoid distractions at bedtime. Distractions include watching television or engaging in any intense or detailed activity like attempting to balance the household checkbook.   Develop a bedtime ritual. Keep a familiar routine of bathing, brushing your teeth, climbing into bed at the same time each night, listening to soothing music. Routines increase the success of falling to sleep faster.   Use relaxation techniques. This can be using breathing and muscle  tension release routines. It can also include visualizing peaceful scenes. You can also help control troubling or intruding thoughts by keeping your mind occupied with boring or repetitive thoughts like the old concept of counting sheep. You can make it more creative like imagining planting one beautiful flower after another in your backyard garden.   During your day, work to eliminate stress. When this is not possible use some of the previous suggestions to help reduce the anxiety that accompanies stressful situations.  MAKE SURE YOU:   Understand these instructions.   Will watch your condition.   Will get help right away if you are not doing well or get worse.  Document Released: 07/03/2000 Document Revised: 06/25/2011 Document Reviewed: 08/03/2007 [facility] Patient Information 2012 [place], [place].

## 2012-02-10 ENCOUNTER — Other Ambulatory Visit: Payer: Self-pay | Admitting: *Deleted

## 2012-02-10 MED ORDER — LORAZEPAM 1 MG PO TABS: ORAL_TABLET | ORAL | Status: DC

## 2012-02-11 ENCOUNTER — Ambulatory Visit: Payer: Self-pay | Admitting: Family

## 2012-02-11 NOTE — Patient Instructions (Signed)
Patient request to be off coumadin 02/23/2012

## 2012-02-15 ENCOUNTER — Telehealth: Payer: Self-pay | Admitting: Internal Medicine

## 2012-02-15 ENCOUNTER — Other Ambulatory Visit: Payer: Self-pay | Admitting: Internal Medicine

## 2012-02-15 MED ORDER — CYCLOBENZAPRINE HCL 10 MG PO TABS: 10.0000 mg | ORAL_TABLET | Freq: Three times a day (TID) | ORAL | Status: DC | PRN

## 2012-02-15 NOTE — Telephone Encounter (Signed)
Caller: Megan Olsen/Patient; PCP: Darryll Capers;; Call regarding Pulled Muscle in Back- trying to open a window- 01/25/12. Pain in L Hip and across lower back and into groin,  She was up 4 times last nght and is having trouble getting comfortable. She is mobile. Pain is deep and throbbing when she lays down. She is having to void frequently. Afebrile. Triage per Back Sx Protocol and Advised OV today or ER but she refused. Appnt scheduled for 02/16/12 @ 0945 with Dr. Caryl Never. She is wondering if muscle relaxer can  Be Called In to help her sleep? [facility] [address] and friend can pick it up.  CB#: [phone]

## 2012-02-15 NOTE — Telephone Encounter (Signed)
Patient has appt for tomorrow with [facility], but she wants to know if Dr. Lovell Sheehan will give her something Rx wise now. For pain, I think. Please call pt. Thanks.

## 2012-02-15 NOTE — Telephone Encounter (Signed)
Per dr Lovell Sheehan- flexeril 10 tid #30-pt informed and med called in

## 2012-02-15 NOTE — Telephone Encounter (Signed)
Opened in error

## 2012-02-16 ENCOUNTER — Ambulatory Visit (INDEPENDENT_AMBULATORY_CARE_PROVIDER_SITE_OTHER): Payer: Medicare PPO | Admitting: Family Medicine

## 2012-02-16 ENCOUNTER — Encounter: Payer: Self-pay | Admitting: Family Medicine

## 2012-02-16 VITALS — BP 120/70 | Temp 97.5°F | Wt 142.0 lb

## 2012-02-16 DIAGNOSIS — S39012A Strain of muscle, fascia and tendon of lower back, initial encounter: Secondary | ICD-10-CM

## 2012-02-16 DIAGNOSIS — S335XXA Sprain of ligaments of lumbar spine, initial encounter: Secondary | ICD-10-CM

## 2012-02-16 MED ORDER — TRAMADOL HCL 50 MG PO TABS: 50.0000 mg | ORAL_TABLET | Freq: Four times a day (QID) | ORAL | Status: DC | PRN

## 2012-02-16 NOTE — Patient Instructions (Addendum)
Lumbosacral Strain Lumbosacral strain is one of the most common causes of back pain. There are many causes of back pain. Most are not serious conditions. CAUSES  Your backbone (spinal column) is made up of 24 main vertebral bodies, the sacrum, and the coccyx. These are held together by muscles and tough, fibrous tissue (ligaments). Nerve roots pass through the openings between the vertebrae. A sudden move or injury to the back may cause injury to, or pressure on, these nerves. This may result in localized back pain or pain movement (radiation) into the buttocks, down the leg, and into the foot. Sharp, shooting pain from the buttock down the back of the leg (sciatica) is frequently associated with a ruptured (herniated) disk. Pain may be caused by muscle spasm alone. Your caregiver can often find the cause of your pain by the details of your symptoms and an exam. In some cases, you may need tests (such as X-rays). Your caregiver will work with you to decide if any tests are needed based on your specific exam. HOME CARE INSTRUCTIONS   Avoid an underactive lifestyle. Active exercise, as directed by your caregiver, is your greatest weapon against back pain.   Avoid hard physical activities (tennis, racquetball, waterskiing) if you are not in proper physical condition for it. This may aggravate or create problems.   If you have a back problem, avoid sports requiring sudden body movements. Swimming and walking are generally safer activities.   Maintain good posture.   Avoid becoming overweight (obese).   Use bed rest for only the most extreme, sudden (acute) episode. Your caregiver will help you determine how much bed rest is necessary.   For acute conditions, you may put ice on the injured area.   Put ice in a plastic bag.   Place a towel between your skin and the bag.   Leave the ice on for 15 to 20 minutes at a time, every 2 hours, or as needed.   After you are improved and more active, it  may help to apply heat for 30 minutes before activities.  See your caregiver if you are having pain that lasts longer than expected. Your caregiver can advise appropriate exercises or therapy if needed. With conditioning, most back problems can be avoided. SEEK IMMEDIATE MEDICAL CARE IF:   You have numbness, tingling, weakness, or problems with the use of your arms or legs.   You experience severe back pain not relieved with medicines.   There is a change in bowel or bladder control.   You have increasing pain in any area of the body, including your belly (abdomen).   You notice shortness of breath, dizziness, or feel faint.   You feel sick to your stomach (nauseous), are throwing up (vomiting), or become sweaty.   You notice discoloration of your toes or legs, or your feet get very cold.   Your back pain is getting worse.   You have a fever.  MAKE SURE YOU:   Understand these instructions.   Will watch your condition.   Will get help right away if you are not doing well or get worse.  Document Released: 04/15/2005 Document Revised: 06/25/2011 Document Reviewed: 10/05/2008 [facility] Patient Information 2012 [place], [place].  Stop Flexeril.  Call in one week if no better.  If no better in one week we need to consider physical therapy.

## 2012-02-16 NOTE — Progress Notes (Signed)
  Subjective:    Patient ID: Megan Olsen, female    DOB: [birth year], 78 y.o.   MRN: [id number]  HPI  Acute visit. Left lower lumbar back injury about 3 weeks ago. She was leaning over sink to raise a window when she thinks she may have injured her back. No significant pain until later that day. Patient has lower lumbar back pain. No radiculopathy symptoms. Pain is relatively constant and worse with changing positions. Actually somewhat better with walking. Started Tylenol which helps still having significant night pain. She takes Lodine regularly 300 mg twice daily. She tried Flexeril but had severe dry mouth and worsening constipation. Heat helps briefly. Ice without relief. No urinary incontinence. No lower extremity weakness or numbness   Review of Systems  Constitutional: Negative for fever, appetite change and unexpected weight change.  Genitourinary: Negative for dysuria.  Neurological: Negative for weakness and numbness.       Objective:   Physical Exam  Constitutional: She appears well-developed and well-nourished.  Cardiovascular: Normal rate and regular rhythm.   Pulmonary/Chest: Effort normal and breath sounds normal. No respiratory distress. She has no wheezes. She has no rales.  Musculoskeletal: She exhibits no edema.       Straight leg raises are negative bilaterally No vertebral tenderness. Mild left paralumbar tenderness  Neurological:       Full-strength lower extremities. Symmetric reflexes knee and ankle bilaterally.          Assessment & Plan:  Mechanical low back strain. Stop Flexeril which is causing some side effects. Tramadol 50 mg one every 6 hours as needed May continue Tylenol and Lodine. Reviewed simple stretches. Continue heat for symptomatic relief. Consider physical therapy in one week if no better.

## 2012-02-24 ENCOUNTER — Other Ambulatory Visit: Payer: Self-pay | Admitting: Internal Medicine

## 2012-03-09 ENCOUNTER — Other Ambulatory Visit: Payer: Self-pay | Admitting: Internal Medicine

## 2012-03-09 MED ORDER — ESTRADIOL 1 MG PO TABS: 1.0000 mg | ORAL_TABLET | Freq: Every day | ORAL | Status: DC

## 2012-03-09 NOTE — Telephone Encounter (Signed)
Pt needs refill on estradiol 1mg  [facility] [address]

## 2012-03-09 NOTE — Telephone Encounter (Signed)
done

## 2012-04-27 ENCOUNTER — Ambulatory Visit: Payer: Medicare PPO | Admitting: Internal Medicine

## 2012-05-02 ENCOUNTER — Encounter: Payer: Self-pay | Admitting: Internal Medicine

## 2012-05-02 ENCOUNTER — Ambulatory Visit (INDEPENDENT_AMBULATORY_CARE_PROVIDER_SITE_OTHER)
Admission: RE | Admit: 2012-05-02 | Discharge: 2012-05-02 | Disposition: A | Discharge status: Home / Self Care | Payer: Medicare PPO | Source: Ambulatory Visit | Attending: Internal Medicine | Admitting: Internal Medicine

## 2012-05-02 ENCOUNTER — Ambulatory Visit (INDEPENDENT_AMBULATORY_CARE_PROVIDER_SITE_OTHER): Payer: Medicare PPO | Admitting: Internal Medicine

## 2012-05-02 VITALS — BP 120/74 | HR 68 | Temp 98.2°F | Resp 16 | Ht 66.0 in | Wt 138.0 lb

## 2012-05-02 DIAGNOSIS — J309 Allergic rhinitis, unspecified: Secondary | ICD-10-CM

## 2012-05-02 DIAGNOSIS — K5900 Constipation, unspecified: Secondary | ICD-10-CM

## 2012-05-02 DIAGNOSIS — Z0000 Encounter for general adult medical examination without abnormal findings: Secondary | ICD-10-CM

## 2012-05-02 DIAGNOSIS — M549 Dorsalgia, unspecified: Secondary | ICD-10-CM

## 2012-05-02 DIAGNOSIS — Z23 Encounter for immunization: Secondary | ICD-10-CM

## 2012-05-02 DIAGNOSIS — E785 Hyperlipidemia, unspecified: Secondary | ICD-10-CM

## 2012-05-02 DIAGNOSIS — I4891 Unspecified atrial fibrillation: Secondary | ICD-10-CM

## 2012-05-02 LAB — HEPATIC FUNCTION PANEL
ALT: 9 U/L (ref 0–35)
AST: 16 U/L (ref 0–37)
Bilirubin, Direct: 0.1 mg/dL (ref 0.0–0.3)
Total Bilirubin: 0.6 mg/dL (ref 0.3–1.2)

## 2012-05-02 LAB — CBC WITH DIFFERENTIAL/PLATELET
Basophils Absolute: 0.1 10*3/uL (ref 0.0–0.1)
Hemoglobin: 10.7 g/dL — ABNORMAL LOW (ref 12.0–15.0)
Lymphocytes Relative: 16 % (ref 12.0–46.0)
Monocytes Relative: 9.1 % (ref 3.0–12.0)
Neutro Abs: 4 10*3/uL (ref 1.4–7.7)
RBC: 3.65 Mil/uL — ABNORMAL LOW (ref 3.87–5.11)
RDW: 14.1 % (ref 11.5–14.6)

## 2012-05-02 LAB — LIPID PANEL
LDL Cholesterol: 78 mg/dL (ref 0–99)
Total CHOL/HDL Ratio: 2
Triglycerides: 83 mg/dL (ref 0.0–149.0)

## 2012-05-02 MED ORDER — TRAMADOL HCL 50 MG PO TABS: 50.0000 mg | ORAL_TABLET | Freq: Four times a day (QID) | ORAL | Status: DC | PRN

## 2012-05-02 MED ORDER — MELOXICAM 15 MG PO TABS: 15.0000 mg | ORAL_TABLET | Freq: Every day | ORAL | Status: DC

## 2012-05-02 NOTE — Addendum Note (Signed)
Addended by: Stacie Glaze on: 05/02/2012 08:34 AM   Modules accepted: Orders

## 2012-05-02 NOTE — Patient Instructions (Signed)
Neck the office within one to 2 weeks and tell us how the new medications are doing to control her back pain and we will let you know your lab results and x-ray results

## 2012-05-02 NOTE — Progress Notes (Signed)
Subjective:    Patient ID: Megan Olsen, female    DOB: 1928-07-15, 78 y.o.   MRN: [id number]  HPI Multiple immunizations today Presents for follow atrial fib has minor palpitations mostly associated with back pain? Allergies are moderately severe with nasal motor rhinitis and watering eyes ziac controlling rate GERD stable on PPI but has constipation The lodine is not helping the arthritic pain  Review of Systems  Constitutional: Positive for fatigue. Negative for activity change and appetite change.  HENT: Negative for ear pain, congestion, neck pain, postnasal drip and sinus pressure.   Eyes: Negative for redness and visual disturbance.  Respiratory: Negative for cough, shortness of breath and wheezing.   Cardiovascular: Positive for palpitations.  Gastrointestinal: Negative for abdominal pain and abdominal distention.  Genitourinary: Negative for dysuria, frequency and menstrual problem.  Musculoskeletal: Positive for back pain, joint swelling and arthralgias. Negative for myalgias.  Skin: Negative for rash and wound.  Neurological: Positive for weakness. Negative for dizziness and headaches.  Hematological: Negative for adenopathy. Does not bruise/bleed easily.  Psychiatric/Behavioral: Negative for disturbed wake/sleep cycle and decreased concentration.   Past Medical History  Diagnosis Date  . GERD (gastroesophageal reflux disease)   . Depression   . Osteoporosis   . Atrial fibrillation     History   Social History  . Marital Status: Single    Spouse Name: N/A    Number of Children: N/A  . Years of Education: N/A   Occupational History  . Not on file.   Social History Main Topics  . Smoking status: Never Smoker   . Smokeless tobacco: Not on file  . Alcohol Use: No  . Drug Use: No  . Sexually Active: No   Other Topics Concern  . Not on file   Social History Narrative  . No narrative on file    Past Surgical History  Procedure Date  .  Cholecystectomy   . Abdominal hysterectomy   . Total abdominal hysterectomy     No family history on file.  Allergies  Allergen Reactions  . Celecoxib   . Fexofenadine     REACTION: Rash  . Penicillins     REACTION: Hives rash  . Propoxyphene-Acetaminophen     REACTION: nausia    Current Outpatient Prescriptions on File Prior to Visit  Medication Sig Dispense Refill  . bisoprolol-hydrochlorothiazide (ZIAC) 5-6.25 MG per tablet Take 1 tablet by mouth daily.  90 tablet  3  . Cholecalciferol (VITAMIN D) 1000 UNITS capsule Take 1,000 Units by mouth daily.        Marland Kitchen dexlansoprazole (DEXILANT) 60 MG capsule Take 1 capsule (60 mg total) by mouth daily.  90 capsule  3  . estradiol (ESTRACE) 1 MG tablet Take 1 tablet (1 mg total) by mouth daily.  90 tablet  3  . etodolac (LODINE) 300 MG capsule Take 1 capsule (300 mg total) by mouth 2 (two) times daily.  180 capsule  1  . fish oil-omega-3 fatty acids 1000 MG capsule Take 1 g by mouth daily.        . fluticasone (FLONASE) 50 MCG/ACT nasal spray Place 2 sprays into the nose 2 (two) times daily as needed.      . furosemide (LASIX) 20 MG tablet Take 20 mg by mouth as needed. As needed for swelling in your legs       . LORazepam (ATIVAN) 1 MG tablet 1/2 at midday and 1 at bedtime  60 tablet  5  . magnesium oxide (  MAG-OX 400) 400 MG tablet Take 1 tablet (400 mg total) by mouth daily.      . Multiple Vitamins-Minerals (ICAPS MV) TABS Take by mouth 2 (two) times daily.        . Turmeric 500 MG CAPS Take by mouth daily.        BP 120/74  Pulse 68  Temp 98.2 F (36.8 C)  Resp 16  Ht 5\' 6"  (1.676 m)  Wt 138 lb (62.596 kg)  BMI 22.27 kg/m2       Objective:   Physical Exam  Nursing note and vitals reviewed. Constitutional: She appears well-developed and well-nourished.  HENT:  Head: Normocephalic and atraumatic.  Eyes: Conjunctivae normal are normal. Pupils are equal, round, and reactive to light.  Neck: Neck supple.  Cardiovascular:    Murmur heard. Pulmonary/Chest: Effort normal and breath sounds normal.  Abdominal: Soft. Bowel sounds are normal.  Musculoskeletal: She exhibits tenderness. She exhibits no edema.  Skin: Skin is warm and dry.  Psychiatric: She has a normal mood and affect. Her behavior is normal.          Assessment & Plan:

## 2012-05-13 ENCOUNTER — Telehealth: Payer: Self-pay | Admitting: *Deleted

## 2012-05-13 DIAGNOSIS — D649 Anemia, unspecified: Secondary | ICD-10-CM

## 2012-05-13 NOTE — Telephone Encounter (Signed)
Requesting l abs work and xray- hgb dropped- give stool cards, take iron and see dr brodie-pt informed

## 2012-08-22 ENCOUNTER — Other Ambulatory Visit: Payer: Self-pay | Admitting: *Deleted

## 2012-08-22 ENCOUNTER — Other Ambulatory Visit: Payer: Self-pay | Admitting: Internal Medicine

## 2012-08-22 NOTE — Telephone Encounter (Signed)
Rx refill for Lorazepam called into pharmacy.

## 2012-09-05 ENCOUNTER — Ambulatory Visit: Payer: Medicare PPO | Admitting: Internal Medicine

## 2012-10-18 ENCOUNTER — Other Ambulatory Visit: Payer: Self-pay | Admitting: Internal Medicine

## 2012-10-19 ENCOUNTER — Other Ambulatory Visit: Payer: Self-pay | Admitting: Internal Medicine

## 2012-10-19 ENCOUNTER — Telehealth: Payer: Self-pay | Admitting: *Deleted

## 2012-10-19 MED ORDER — BISOPROLOL-HYDROCHLOROTHIAZIDE 5-6.25 MG PO TABS: ORAL_TABLET | ORAL | Status: DC

## 2012-10-19 NOTE — Telephone Encounter (Signed)
done

## 2012-11-03 ENCOUNTER — Other Ambulatory Visit: Payer: Self-pay | Admitting: Internal Medicine

## 2012-11-28 ENCOUNTER — Encounter: Payer: Self-pay | Admitting: Family Medicine

## 2012-11-28 ENCOUNTER — Ambulatory Visit (INDEPENDENT_AMBULATORY_CARE_PROVIDER_SITE_OTHER): Payer: Medicare PPO | Admitting: Family Medicine

## 2012-11-28 VITALS — BP 160/60 | Temp 98.6°F | Wt 135.0 lb

## 2012-11-28 DIAGNOSIS — B029 Zoster without complications: Secondary | ICD-10-CM

## 2012-11-28 NOTE — Progress Notes (Signed)
  Subjective:    Patient ID: Megan Olsen, female    DOB: 1928/05/21, 79 y.o.   MRN: [id number]  HPI Acute visit Patient is seen with rash which started about 2 weeks ago lower thoracic region . She noted one area of rash noted the midline and another just underneath the breast region. She has some associated pain which is a burning/ stinging pain tends to be worse late day. Using tramadol which helps.  She is applying some type of topical antibacterial Denies any fever or chills.  Current pain is rated 1-2 out of 10 in the morning and sometimes around 6-7/10 late day She had shingles vaccine couple years ago  Past Medical History  Diagnosis Date  . GERD (gastroesophageal reflux disease)   . Depression   . Osteoporosis   . Atrial fibrillation    Past Surgical History  Procedure Laterality Date  . Cholecystectomy    . Abdominal hysterectomy    . Total abdominal hysterectomy      reports that she has never smoked. She does not have any smokeless tobacco history on file. She reports that she does not drink alcohol or use illicit drugs. family history is not on file. Allergies  Allergen Reactions  . Celecoxib   . Fexofenadine     REACTION: Rash  . Penicillins     REACTION: Hives rash  . Propoxyphene-Acetaminophen     REACTION: nausia      Review of Systems  Constitutional: Negative for fever, chills and appetite change.  Respiratory: Negative for cough and shortness of breath.   Cardiovascular: Negative for chest pain.  Skin: Positive for rash.       Objective:   Physical Exam  Constitutional: She appears well-developed and well-nourished.  Cardiovascular: Normal rate and regular rhythm.   Pulmonary/Chest: Effort normal and breath sounds normal. No respiratory distress. She has no wheezes. She has no rales.  Skin: Rash noted.  Patient has crusted erythematous rash patch just right of midline around T10 and a separate patche following dermatome distribution  anterior chest wall on the right          Assessment & Plan:  Shingles. Reassurance. No antivirals since this is over 75 weeks old. Minimal pain. Continue tramadol as needed

## 2012-11-28 NOTE — Patient Instructions (Addendum)
Shingles Shingles is caused by the same virus that causes chickenpox (varicella zoster virus or VZV). Shingles often occurs many years or decades after having chickenpox. That is why it is more common in adults older than 50 years. The virus reactivates and breaks out as an infection in a nerve root. SYMPTOMS   The initial feeling (sensations) may be pain. This pain is usually described as:  Burning.  Stabbing.  Throbbing.  Tingling in the nerve root.  A red rash will follow in a couple days. The rash may occur in any area of the body and is usually on one side (unilateral) of the body in a band or belt-like pattern. The rash usually starts out as very small blisters (vesicles). They will dry up after 7 to 10 days. This is not usually a significant problem except for the pain it causes.  Long-lasting (chronic) pain is more likely in an elderly person. It can last months to years. This condition is called postherpetic neuralgia. Shingles can be an extremely severe infection in someone with AIDS, a weakened immune system, or with forms of leukemia. It can also be severe if you are taking transplant medicines or other medicines that weaken the immune system. TREATMENT  Your caregiver will often treat you with:  Antiviral drugs.  Anti-inflammatory drugs.  Pain medicines. Bed rest is very important in preventing the pain associated with herpes zoster (postherpetic neuralgia). Application of heat in the form of a hot water bottle or electric heating pad or gentle pressure with the hand is recommended to help with the pain or discomfort. PREVENTION  A varicella zoster vaccine is available to help protect against the virus. The Food and Drug Administration approved the varicella zoster vaccine for individuals 4 years of age and older. HOME CARE INSTRUCTIONS   Cool compresses to the area of rash may be helpful.  Only take over-the-counter or prescription medicines for pain, discomfort, or  fever as directed by your caregiver.  Avoid contact with:  Babies.  Pregnant women.  Children with eczema.  Elderly people with transplants.  People with chronic illnesses, such as leukemia and AIDS.  If the area involved is on your face, you may receive a referral for follow-up to a specialist. It is very important to keep all follow-up appointments. This will help avoid eye complications, chronic pain, or disability. SEEK IMMEDIATE MEDICAL CARE IF:   You develop any pain (headache) in the area of the face or eye. This must be followed carefully by your caregiver or ophthalmologist. An infection in part of your eye (cornea) can be very serious. It could lead to blindness.  You do not have pain relief from prescribed medicines.  Your redness or swelling spreads.  The area involved becomes very swollen and painful.  You have a fever.  You notice any red or painful lines extending away from the affected area toward your heart (lymphangitis).  Your condition is worsening or has changed. Document Released: 07/06/2005 Document Revised: 09/28/2011 Document Reviewed: 06/10/2009 [facility] Patient Information 2013 [place], [place].  May continue with tramadol for pain.  Be in touch for any worsening pain.

## 2012-12-05 ENCOUNTER — Encounter: Payer: Self-pay | Admitting: Internal Medicine

## 2012-12-05 ENCOUNTER — Ambulatory Visit (INDEPENDENT_AMBULATORY_CARE_PROVIDER_SITE_OTHER): Payer: Medicare PPO | Admitting: Internal Medicine

## 2012-12-05 VITALS — BP 130/80 | HR 72 | Temp 98.2°F | Resp 16 | Ht 66.0 in | Wt 130.0 lb

## 2012-12-05 DIAGNOSIS — E538 Deficiency of other specified B group vitamins: Secondary | ICD-10-CM

## 2012-12-05 DIAGNOSIS — I4891 Unspecified atrial fibrillation: Secondary | ICD-10-CM

## 2012-12-05 DIAGNOSIS — E559 Vitamin D deficiency, unspecified: Secondary | ICD-10-CM

## 2012-12-05 DIAGNOSIS — D519 Vitamin B12 deficiency anemia, unspecified: Secondary | ICD-10-CM

## 2012-12-05 DIAGNOSIS — K219 Gastro-esophageal reflux disease without esophagitis: Secondary | ICD-10-CM

## 2012-12-05 DIAGNOSIS — D518 Other vitamin B12 deficiency anemias: Secondary | ICD-10-CM

## 2012-12-05 DIAGNOSIS — K5900 Constipation, unspecified: Secondary | ICD-10-CM

## 2012-12-05 DIAGNOSIS — D509 Iron deficiency anemia, unspecified: Secondary | ICD-10-CM

## 2012-12-05 LAB — CBC WITH DIFFERENTIAL/PLATELET
Basophils Absolute: 0 10*3/uL (ref 0.0–0.1)
Basophils Relative: 0.4 % (ref 0.0–3.0)
Eosinophils Relative: 4.8 % (ref 0.0–5.0)
HCT: 29.4 % — ABNORMAL LOW (ref 36.0–46.0)
Hemoglobin: 9.3 g/dL — ABNORMAL LOW (ref 12.0–15.0)
Lymphs Abs: 1.3 10*3/uL (ref 0.7–4.0)
Monocytes Relative: 8.9 % (ref 3.0–12.0)
Neutro Abs: 4.9 10*3/uL (ref 1.4–7.7)
RBC: 4.2 Mil/uL (ref 3.87–5.11)
RDW: 18.5 % — ABNORMAL HIGH (ref 11.5–14.6)

## 2012-12-05 MED ORDER — CYANOCOBALAMIN 1000 MCG/ML IJ SOLN
1000.0000 ug | INTRAMUSCULAR | Status: AC
  Administered 2012-12-05: 1000 ug via INTRAMUSCULAR

## 2012-12-05 NOTE — Addendum Note (Signed)
Addended by: Rita Ohara R on: 12/05/2012 03:35 PM   Modules accepted: Orders

## 2012-12-05 NOTE — Progress Notes (Signed)
Subjective:    Patient ID: Megan Olsen, female    DOB: February 10, 1928, 79 y.o.   MRN: [id number]  HPI Increased fatigue Has not been walking Has begun work outs at the [employer] Possible AF rate? Oxygen levels normal ( sat 95%)    Review of Systems  HENT: Negative.   Respiratory: Positive for chest tightness and shortness of breath. Negative for cough, choking, wheezing and stridor.   Cardiovascular: Negative for chest pain and leg swelling.  Gastrointestinal: Negative.   Endocrine: Negative.   Genitourinary: Negative.   All other systems reviewed and are negative.   Past Medical History  Diagnosis Date  . GERD (gastroesophageal reflux disease)   . Depression   . Osteoporosis   . Atrial fibrillation     History   Social History  . Marital Status: Single    Spouse Name: N/A    Number of Children: N/A  . Years of Education: N/A   Occupational History  . Not on file.   Social History Main Topics  . Smoking status: Never Smoker   . Smokeless tobacco: Not on file  . Alcohol Use: No  . Drug Use: No  . Sexually Active: No   Other Topics Concern  . Not on file   Social History Narrative  . No narrative on file    Past Surgical History  Procedure Laterality Date  . Cholecystectomy    . Abdominal hysterectomy    . Total abdominal hysterectomy      No family history on file.  Allergies  Allergen Reactions  . Celecoxib   . Fexofenadine     REACTION: Rash  . Penicillins     REACTION: Hives rash  . Propoxyphene-Acetaminophen     REACTION: nausia    Current Outpatient Prescriptions on File Prior to Visit  Medication Sig Dispense Refill  . aspirin 325 MG tablet Take 325 mg by mouth daily.      . bisoprolol-hydrochlorothiazide (ZIAC) 5-6.25 MG per tablet TAKE 1 TABLET BY MOUTH ONCE DAILY  90 tablet  3  . fish oil-omega-3 fatty acids 1000 MG capsule Take 1 g by mouth daily.        . furosemide (LASIX) 20 MG tablet Take 20 mg by mouth as needed. As needed for  swelling in your legs       . LORazepam (ATIVAN) 1 MG tablet TAKE 1/2 TABLET BY MOUTH ONCE EVERY AM, THEN 1 TABLET BY MOUTH AT BEDTIME  45 tablet  4  . magnesium oxide (MAG-OX 400) 400 MG tablet Take 1 tablet (400 mg total) by mouth daily.      . meloxicam (MOBIC) 15 MG tablet TAKE 1 TABLET BY MOUTH EVERY DAY  30 tablet  5  . Multiple Vitamins-Minerals (ICAPS MV) TABS Take by mouth 2 (two) times daily.         No current facility-administered medications on file prior to visit.    BP 130/80  Pulse 72  Temp(Src) 98.2 F (36.8 C)  Resp 16  Ht 5\' 6"  (1.676 m)  Wt 130 lb (58.968 kg)  BMI 20.99 kg/m2        Objective:   Physical Exam  Nursing note and vitals reviewed. Constitutional: She is oriented to person, place, and time. She appears well-developed and well-nourished.  Eyes: Conjunctivae are normal.  Cardiovascular:  Murmur heard. Irregular rate and rythmn  Pulmonary/Chest: Effort normal and breath sounds normal.  Abdominal: Soft.  Musculoskeletal: She exhibits edema and tenderness.  Neurological: She is  alert and oriented to person, place, and time.          Assessment & Plan:  Fatigue  with history of anemia Monitor CBC and Iron Check vit d level ( off medications) B12 Exercise needed AF with controlled rate

## 2012-12-05 NOTE — Addendum Note (Signed)
Addended by: Willy Eddy on: 12/05/2012 05:41 PM   Modules accepted: Orders

## 2012-12-05 NOTE — Patient Instructions (Signed)
The patient is instructed to continue all medications as prescribed. Schedule followup with check out clerk upon leaving the clinic  

## 2012-12-29 ENCOUNTER — Telehealth: Payer: Self-pay | Admitting: Internal Medicine

## 2012-12-29 NOTE — Telephone Encounter (Signed)
Gave labs- willlshow results to dr Lovell Sheehan tomorrowfor advise

## 2012-12-29 NOTE — Telephone Encounter (Signed)
Pt calling to get lab results from 5/19. Please assist.

## 2012-12-30 ENCOUNTER — Other Ambulatory Visit: Payer: Self-pay | Admitting: *Deleted

## 2012-12-30 ENCOUNTER — Other Ambulatory Visit (INDEPENDENT_AMBULATORY_CARE_PROVIDER_SITE_OTHER): Payer: Medicare PPO

## 2012-12-30 DIAGNOSIS — D649 Anemia, unspecified: Secondary | ICD-10-CM

## 2012-12-30 LAB — CBC WITH DIFFERENTIAL/PLATELET
Basophils Relative: 0 % (ref 0.0–3.0)
Eosinophils Relative: 4.5 % (ref 0.0–5.0)
HCT: 27.1 % — ABNORMAL LOW (ref 36.0–46.0)
Hemoglobin: 8.5 g/dL — ABNORMAL LOW (ref 12.0–15.0)
Lymphs Abs: 1 10*3/uL (ref 0.7–4.0)
MCV: 69.2 fl — ABNORMAL LOW (ref 78.0–100.0)
Monocytes Absolute: 0.5 10*3/uL (ref 0.1–1.0)
Monocytes Relative: 7.5 % (ref 3.0–12.0)
RBC: 3.91 Mil/uL (ref 3.87–5.11)
WBC: 7.1 10*3/uL (ref 4.5–10.5)

## 2012-12-30 MED ORDER — FERROUS SULFATE 325 (65 FE) MG PO TABS: 325.0000 mg | ORAL_TABLET | Freq: Two times a day (BID) | ORAL | Status: DC

## 2012-12-30 MED ORDER — OMEPRAZOLE 20 MG PO CPDR: 20.0000 mg | DELAYED_RELEASE_CAPSULE | Freq: Two times a day (BID) | ORAL | Status: DC

## 2012-12-30 NOTE — Telephone Encounter (Signed)
Per dr Lovell Sheehan- start iron 325 bid prilosec  20 bid repeat cbc and stool card---talked with pt and she states she has tried to do stool cards in the past and was unable to do them. Dr Lovell Sheehan informed= iron and pirilosec sent to pharmacy and pt com ing in today for cbc

## 2013-01-02 ENCOUNTER — Other Ambulatory Visit: Payer: Self-pay | Admitting: *Deleted

## 2013-01-02 ENCOUNTER — Emergency Department (HOSPITAL_COMMUNITY): Payer: Medicare PPO

## 2013-01-02 ENCOUNTER — Emergency Department (HOSPITAL_COMMUNITY)
Admission: EM | Admit: 2013-01-02 | Discharge: 2013-01-02 | Disposition: A | Discharge status: Home / Self Care | Payer: Medicare PPO | Attending: Emergency Medicine | Admitting: Emergency Medicine

## 2013-01-02 ENCOUNTER — Encounter (HOSPITAL_COMMUNITY): Payer: Self-pay | Admitting: Physician Assistant

## 2013-01-02 DIAGNOSIS — F3289 Other specified depressive episodes: Secondary | ICD-10-CM | POA: Insufficient documentation

## 2013-01-02 DIAGNOSIS — D509 Iron deficiency anemia, unspecified: Secondary | ICD-10-CM

## 2013-01-02 DIAGNOSIS — M810 Age-related osteoporosis without current pathological fracture: Secondary | ICD-10-CM | POA: Insufficient documentation

## 2013-01-02 DIAGNOSIS — Z79899 Other long term (current) drug therapy: Secondary | ICD-10-CM | POA: Insufficient documentation

## 2013-01-02 DIAGNOSIS — K921 Melena: Secondary | ICD-10-CM | POA: Insufficient documentation

## 2013-01-02 DIAGNOSIS — M1990 Unspecified osteoarthritis, unspecified site: Secondary | ICD-10-CM | POA: Insufficient documentation

## 2013-01-02 DIAGNOSIS — D649 Anemia, unspecified: Secondary | ICD-10-CM

## 2013-01-02 DIAGNOSIS — K219 Gastro-esophageal reflux disease without esophagitis: Secondary | ICD-10-CM

## 2013-01-02 DIAGNOSIS — Z8679 Personal history of other diseases of the circulatory system: Secondary | ICD-10-CM | POA: Insufficient documentation

## 2013-01-02 DIAGNOSIS — Z880 Allergy status to penicillin: Secondary | ICD-10-CM | POA: Insufficient documentation

## 2013-01-02 DIAGNOSIS — F329 Major depressive disorder, single episode, unspecified: Secondary | ICD-10-CM | POA: Insufficient documentation

## 2013-01-02 DIAGNOSIS — Z8673 Personal history of transient ischemic attack (TIA), and cerebral infarction without residual deficits: Secondary | ICD-10-CM | POA: Insufficient documentation

## 2013-01-02 HISTORY — DX: Iron deficiency anemia, unspecified: D50.9

## 2013-01-02 HISTORY — DX: Unspecified osteoarthritis, unspecified site: M19.90

## 2013-01-02 HISTORY — DX: Cerebral infarction, unspecified: I63.9

## 2013-01-02 LAB — CBC WITH DIFFERENTIAL/PLATELET
Basophils Relative: 1 % (ref 0–1)
Eosinophils Relative: 5 % (ref 0–5)
HCT: 27.6 % — ABNORMAL LOW (ref 36.0–46.0)
Hemoglobin: 8.7 g/dL — ABNORMAL LOW (ref 12.0–15.0)
MCH: 21.5 pg — ABNORMAL LOW (ref 26.0–34.0)
Monocytes Absolute: 0.9 10*3/uL (ref 0.1–1.0)
Neutro Abs: 5.1 10*3/uL (ref 1.7–7.7)
Neutrophils Relative %: 65 % (ref 43–77)
RBC: 4.04 MIL/uL (ref 3.87–5.11)

## 2013-01-02 LAB — HEPATIC FUNCTION PANEL
ALT: 12 U/L (ref 0–35)
Albumin: 2.9 g/dL — ABNORMAL LOW (ref 3.5–5.2)
Alkaline Phosphatase: 105 U/L (ref 39–117)
Total Bilirubin: 0.2 mg/dL — ABNORMAL LOW (ref 0.3–1.2)
Total Protein: 6.1 g/dL (ref 6.0–8.3)

## 2013-01-02 LAB — FERRITIN: Ferritin: 50 ng/mL (ref 10–291)

## 2013-01-02 LAB — POCT I-STAT, CHEM 8
BUN: 16 mg/dL (ref 6–23)
Calcium, Ion: 1.1 mmol/L — ABNORMAL LOW (ref 1.13–1.30)
Creatinine, Ser: 0.9 mg/dL (ref 0.50–1.10)
Glucose, Bld: 82 mg/dL (ref 70–99)
TCO2: 27 mmol/L (ref 0–100)

## 2013-01-02 LAB — RETICULOCYTES: Retic Ct Pct: 1.8 % (ref 0.4–3.1)

## 2013-01-02 LAB — TYPE AND SCREEN: Antibody Screen: NEGATIVE

## 2013-01-02 LAB — APTT: aPTT: 37 seconds (ref 24–37)

## 2013-01-02 LAB — PROTIME-INR: Prothrombin Time: 12.3 seconds (ref 11.6–15.2)

## 2013-01-02 MED ORDER — SODIUM CHLORIDE 0.9 % IV SOLN
1000.0000 mL | INTRAVENOUS | Status: DC
  Administered 2013-01-02: 1000 mL via INTRAVENOUS

## 2013-01-02 NOTE — ED Notes (Signed)
Patient states that Dr. Marlyne Beards office called her and told her to come to the ED for a low Hgb 7.3  Patient here for admission and further treatment.

## 2013-01-02 NOTE — Consult Note (Signed)
[facility] Consult: 2:35 PM 01/02/2013   Referring Provider: Darryll Capers  Primary Care Physician:  Carrie Mew, MD Primary Gastroenterologist:  Dr. Lina Sar   Reason for Consultation:  Microcytic anemia  HPI: Megan Olsen is a 79 y.o. female.  Very healthy, active and independent lady who does not look or act her age.  Hx of atrial fibrillation.  S/p catheter ablation of SVT in 2002. Previously on Coumadin but this was discontinued about one year ago.  Takes 325 ASA and Mobic daily.  Hx CVA in 2003, no sequelae.  Dr York Ram has been following anemia for about 3 months.  On Friday 6.13 the Hgb dropped to 8.5 from 9.3 one month ago, 10.7 in 04/2012.  MCV is low.  Iron level was low in 11/2012 but did not start po Iron until 6/14.  Has had trouble tolerating po Iron in past so Dr York Ram also added Omeprazole the same day. He became concerned by the further drop in Hgb and thus she was sent to ED today.  3 months of increasing, but not profound, fatigue.  Still manages to attend exercise classes [facility]  No dizziness or presyncope.  No increase in occasional brief runs of tachycardia or irregular heart rate.  No numbness or tingling.  No hx thyroid disease.  No weight gain or loss for at least one year.  Excellent appetite.  No dark or bloody stools.  No nausea.  Occasional pill dysphagia.  Drinks 1 to 2 glasses of wine a few times per month if that often.  Never had transfusion of blood in past.  Did have colonoscopy about 9 years ago per her recall, but no records to support this found in Epic.  Did not have polyps per her recall. On CT scan of 2007 colonic diverticulosis was noted.    Past Medical History  Diagnosis Date  . GERD (gastroesophageal reflux disease)   . Depression   . Osteoporosis   . Atrial fibrillation     catheter ablation of SVT in 2002  . Osteoarthritis   . CVA (cerebrovascular  accident) 2003    left brain    Past Surgical History  Procedure Laterality Date  . Cholecystectomy    . Abdominal hysterectomy    . Total abdominal hysterectomy    . Right eye surgery  2008    dr Luciana Axe.  Vitrectomy and removal of tissue.   . Colonoscopy    . Laparoscopic ovarian cystectomy      Prior to Admission medications   Medication Sig Start Date End Date Taking? Authorizing Provider  aspirin 325 MG tablet Take 325 mg by mouth daily.    Historical Provider, MD  bisoprolol-hydrochlorothiazide [facility]) 5-6.25 MG per tablet TAKE 1 TABLET BY MOUTH ONCE DAILY 10/19/12   Stacie Glaze, MD  ferrous sulfate 325 (65 FE) MG tablet Take 1 tablet (325 mg total) by mouth 2 (two) times daily with a meal. 12/30/12   Stacie Glaze, MD  fish oil-omega-3 fatty acids 1000 MG capsule Take 1 g by mouth daily.      Historical Provider, MD  furosemide (LASIX) 20 MG tablet Take 20 mg by mouth as needed. As needed for swelling in your legs  10/01/10   Stacie Glaze, MD  LORazepam (ATIVAN) 1 MG tablet TAKE 1/2 TABLET BY MOUTH ONCE EVERY AM, THEN 1 TABLET BY MOUTH AT BEDTIME 08/22/12   Stacie Glaze, MD  meloxicam [facility]) 15  MG tablet TAKE 1 TABLET BY MOUTH EVERY DAY 11/03/12   Stacie Glaze, MD  Multiple Vitamins-Minerals (ICAPS MV) TABS Take by mouth 2 (two) times daily.      Historical Provider, MD  omeprazole (PRILOSEC) 20 MG capsule Take 1 capsule (20 mg total) by mouth 2 (two) times daily. 12/30/12   Stacie Glaze, MD    Scheduled Meds:  Infusions:  PRN Meds:      Allergies as of 01/02/2013 - Review Complete 01/02/2013  Allergen Reaction Noted  . Celecoxib  04/08/2009  . Fexofenadine    . Penicillins    . Propoxyphene-acetaminophen  04/08/2009    History reviewed. No pertinent family history.  History   Social History  . Marital Status: Single    Spouse Name: N/A    Number of Children: N/A  . Years of Education: N/A   Occupational History  . Not on file.   Social History Main  Topics  . Smoking status: Never Smoker   . Smokeless tobacco: Never Used  . Alcohol Use: No  . Drug Use: No  . Sexually Active: No    REVIEW OF SYSTEMS: Constitutional:  HPI ENT:  No epistaxis Pulm:  No cough or dyspnea CV:  No chest pain.  Dependent LE edema GU:  No blood in urine GI:  HPI Heme:  HPI.    Transfusions:  HPI Neuro:  No double vision.  No loss of visual fields Derm:  Rash beneath the breasts started late 10/2012.  About one week ago burned her abdomen with a curling iron  Endocrine:  No hx DM no thyroid disease Immunization:  Has had shingles vaccine.  Travel:  Not queried   PHYSICAL EXAM: Vital signs in last 24 hours: Temp:  [97.6 F (36.4 C)] 97.6 F (36.4 C) (06/16 1354) Pulse Rate:  [69] 69 (06/16 1354) Resp:  [12] 12 (06/16 1354) BP: (158)/(78) 158/78 mmHg (06/16 1354) SpO2:  [100 %] 100 % (06/16 1354) Weight:  [61.236 kg (135 lb)] 61.236 kg (135 lb) (06/16 1354)  General: pleasant, well appearing elderly WF.  Looks great for her age Head:  No swelling, trauma or assymetry  Eyes:  No icterus or pallor Ears:  Not HOH  Nose:  No discharge or dried blood Mouth:  Moist, clear oral MM. Good dental repair Neck:  No mass, bruits, or thyromegaly Lungs:  Fine rales in right base.  No dyspnea or cough Heart:  Split S1, no murmers.  Mostly regular with occasional brief xtra beats Abdomen:  Soft, nt, nd, no mass, no bruits.  No HSM.  Marland Kitchen   Rectal: Brown soft stool is FOB negative   Musc/Skeltl: no joint deformities or swellilng Extremities:  Slight non-pitting pedal edema  Neurologic:  No tremor.  Oriented x 3.  No gross deficits.  Excellent historian Skin:  No rash,  No telangectasia. Tattoos:  none Nodes:  No inguinal or cervical adenopathy.    Psych:  Pleasant, cooperative.  No agitation or depression.  LAB RESULTS:   Ref. Range 05/02/2012 08:41 05/02/2012 10:59 12/05/2012 15:25 12/30/2012 13:52  RBC Latest Range: 3.87-5.11 Mil/uL 3.65 (L)  4.20 3.91   Hemoglobin Latest Range: 12.0-15.0 g/dL 16.1 (L)  9.3 (L) 8.5 Repeated and verified X2. (L)  HCT Latest Range: 36.0-46.0 % 33.2 (L)  29.4 (L) 27.1 (L)  MCV Latest Range: 78.0-100.0 fl 91.0  69.9 (L) 69.2 (L)  MCHC Latest Range: 30.0-36.0 g/dL 09.6  04.5 40.9  RDW Latest Range: 11.5-14.6 % 14.1  18.5 (H) 19.0 (H)  Platelets Latest Range: 150.0-400.0 K/uL 206.0  251.0 243.0   Iron level           14      12/05/2012  BMET Lab Results  Component Value Date   NA 140 10/01/2010   NA 140 01/31/2007   K 4.0 10/01/2010   K 4.0 01/31/2007   CL 104 10/01/2010   CL 104 01/31/2007   CO2 27 10/01/2010   CO2 29 01/31/2007   GLUCOSE 77 10/01/2010   GLUCOSE 93 01/31/2007   BUN 12 10/01/2010   BUN 12 01/31/2007   CREATININE 0.6 10/01/2010   CREATININE 0.68 01/31/2007   CALCIUM 8.4 10/01/2010   CALCIUM 9.0 01/31/2007   LFT No results found for this basename: PROT, ALBUMIN, AST, ALT, ALKPHOS, BILITOT, BILIDIR, IBILI,  in the last 72 hours PT/INR Lab Results  Component Value Date   INR 1.9 02/23/2011   INR 2.7 01/22/2011   INR 3.3 12/22/2010    RADIOLOGY STUDIES: No results found.  ENDOSCOPIC STUDIES: Colonoscopies x 2.  Nothing in Epic  IMPRESSION: *  Microcytic anemia.  By single hemoccult today, she is FOB negative.  No dynamic changes in bowel habits, not hemodynamically unstable.  *  Osteoarthritis.  Takes Mobic once daily.  This can cause gastritis, ulcers.  *  Hx A fib and s/p ablation of SVT in 2002   PLAN: *  Case d/w Dr Russella Dar:  Have arranged for 7/9 outpt colon and EGD with Dr Juanda Chance.  Pt also has pre procedure visit on 7/1.   *  If she develops more acute sxs, and further drop in Hgb, can consider admission then.  *  Check TSH, as thyroid disease can be source of anemia. *  Does not meet criteria for blood transfusion, nor for inpt admission.   *  Should avoid demanding exertion such as long walks or gym workouts until her blood counts improve.  Also advised her to stop Meloxicam.  Will  continue the ASA 325 for now.  Continue the iron and the Omeprazole.   The above was discussed/explained to patient.     LOS: 0 days   Jennye Moccasin  01/02/2013, 2:35 PM Pager: [phone]       Attending physician's note   I have taken a history, examined the patient and reviewed the chart. I agree with the Advanced Practitioner's note, impression and recommendations.  Meryl Dare, MD Clement[facility] Graham

## 2013-01-02 NOTE — ED Notes (Signed)
PA at bedside.

## 2013-01-02 NOTE — ED Provider Notes (Signed)
History   CSN: [id number] Arrival date & time 01/02/13  1346 First MD Initiated Contact with Patient 01/02/13 1418      Chief Complaint  Patient presents with  . abnormal lab work    HPI Patient presents to the emergency room for evaluation of anemia. The patient has noticed increasing dyspnea on exertion over the last few weeks. Patient went to see her primary Dr. in May 19.  She had laboratory tests performed. She was noted to be anemic with a hemoglobin in the 8 range. She was instructed to start iron and Prilosec. Her doctor saw her again and had repeat blood test done this month. She was called today with the results and told to come to the emergency department to be admitted to the hospital. Patient has not noticed any blood in her stool. She has not had any chest pain. She does not feel short of breath at rest.  She does notice that she gets more fatigued when walking and exerting herself now.  She actually feels pretty good and was surprised that she was sent to the hospital.   Past Medical History  Diagnosis Date  . GERD (gastroesophageal reflux disease)   . Depression   . Osteoporosis   . Atrial fibrillation     catheter ablation of SVT in 2002  . Osteoarthritis   . CVA (cerebrovascular accident) 2003    left brain    Past Surgical History  Procedure Laterality Date  . Cholecystectomy    . Abdominal hysterectomy    . Total abdominal hysterectomy    . Right eye surgery  2008    dr Luciana Axe.  Vitrectomy and removal of tissue.   . Colonoscopy    . Laparoscopic ovarian cystectomy      History reviewed. No pertinent family history.  History  Substance Use Topics  . Smoking status: Never Smoker   . Smokeless tobacco: Never Used  . Alcohol Use: No    OB History   Grav Para Term Preterm Abortions TAB SAB Ect Mult Living                  Review of Systems  All other systems reviewed and are negative.    Allergies  Celecoxib; Fexofenadine; Penicillins; and  Propoxyphene-acetaminophen  Home Medications   Current Outpatient Rx  Name  Route  Sig  Dispense  Refill  . aspirin 325 MG tablet   Oral   Take 325 mg by mouth daily.         . bisoprolol-hydrochlorothiazide (ZIAC) 5-6.25 MG per tablet   Oral   Take 1 tablet by mouth daily.         Marland Kitchen dexlansoprazole (DEXILANT) 60 MG capsule   Oral   Take 60 mg by mouth daily.         Marland Kitchen estradiol (ESTRACE) 1 MG tablet   Oral   Take 1 mg by mouth daily.         . ferrous sulfate 325 (65 FE) MG tablet   Oral   Take 1 tablet (325 mg total) by mouth 2 (two) times daily with a meal.   60 tablet   1   . fish oil-omega-3 fatty acids 1000 MG capsule   Oral   Take 1 g by mouth daily.           . fluticasone (FLONASE) 50 MCG/ACT nasal spray   Nasal   Place 2 sprays into the nose daily.         Marland Kitchen  furosemide (LASIX) 20 MG tablet   Oral   Take 20 mg by mouth as needed. As needed for swelling in your legs          . LORazepam (ATIVAN) 1 MG tablet   Oral   Take 0.5-1 mg by mouth 2 (two) times daily. Take 0.5 mg in the am and 1 mg in the pm         . magnesium oxide (MAG-OX) 400 MG tablet   Oral   Take 400 mg by mouth daily.         . meloxicam (MOBIC) 15 MG tablet   Oral   Take 15 mg by mouth daily.         . Multiple Vitamins-Minerals (ICAPS LUTEIN & ZEAXANTHIN PO)   Oral   Take 1 tablet by mouth daily.         Marland Kitchen omeprazole (PRILOSEC) 20 MG capsule   Oral   Take 1 capsule (20 mg total) by mouth 2 (two) times daily.   30 capsule   1   . traMADol (ULTRAM) 50 MG tablet   Oral   Take 50 mg by mouth every 6 (six) hours as needed for pain.           BP 158/78  Pulse 69  Temp(Src) 97.6 F (36.4 C) (Oral)  Resp 12  Ht 5\' 7"  (1.702 m)  Wt 135 lb (61.236 kg)  BMI 21.14 kg/m2  SpO2 100%  Physical Exam  Nursing note and vitals reviewed. Constitutional: She appears well-developed and well-nourished. No distress.  HENT:  Head: Normocephalic and atraumatic.   Right Ear: External ear normal.  Left Ear: External ear normal.  Eyes: Conjunctivae are normal. Right eye exhibits no discharge. Left eye exhibits no discharge. No scleral icterus.  Neck: Neck supple. No tracheal deviation present.  Cardiovascular: Normal rate, regular rhythm and intact distal pulses.   Pulmonary/Chest: Effort normal and breath sounds normal. No stridor. No respiratory distress. She has no wheezes. She has no rales.  Abdominal: Soft. Bowel sounds are normal. She exhibits no distension. There is no tenderness. There is no rebound and no guarding.  Musculoskeletal: She exhibits no edema and no tenderness.  Neurological: She is alert. She has normal strength. No sensory deficit. Cranial nerve deficit:  no gross defecits noted. She exhibits normal muscle tone. She displays no seizure activity. Coordination normal.  Skin: Skin is warm and dry. No rash noted. There is pallor.  Psychiatric: She has a normal mood and affect.    ED Course  Procedures (including critical care time)  Labs Reviewed  POCT I-STAT, CHEM 8 - Abnormal; Notable for the following:    Calcium, Ion 1.10 (*)    Hemoglobin 9.9 (*)    HCT 29.0 (*)    All other components within normal limits  APTT  PROTIME-INR  CBC WITH DIFFERENTIAL  HEPATIC FUNCTION PANEL  VITAMIN B12  FOLATE  IRON AND TIBC  FERRITIN  RETICULOCYTES  TSH  TYPE AND SCREEN   Dg Chest Portable 1 View  01/02/2013   *RADIOLOGY REPORT*  Clinical Data: Shortness of breath.  Anemia.  PORTABLE CHEST - 1 VIEW  Comparison: 01/31/2007.  Findings: Mildly progressive enlargement of the cardiac silhouette with a mild increase in prominence of the pulmonary vasculature. Interval small to moderate-sized bilateral pleural effusions and ill-defined opacity at both lung bases.  Thoracic spine degenerative changes.  IMPRESSION: 1.  Mildly progressive cardiomegaly with interval changes of congestive heart failure superimposed on COPD.  2.  Bibasilar  atelectasis or pneumonia and possible alveolar edema.   Original Report Authenticated By: Beckie Salts, M.D.     1. Anemia       MDM  Patient is hemodynamically stable in the emergency department. She is sent here for admission for further evaluation of worsening anemia. [facility] have  evaluated the patient in the emergency department.  The patient was evaluated by [facility].  Her hgb is stable from the previous day.  Pt does not require blood transfusion.  GI does not recommend admission.  They will arrange close outpatient follow up .  Pt is comfortable with that plan.         Celene Kras, MD 01/02/13 [phone]

## 2013-01-03 LAB — IRON AND TIBC
Saturation Ratios: 78 % — ABNORMAL HIGH (ref 20–55)
TIBC: 458 ug/dL (ref 250–470)

## 2013-01-04 ENCOUNTER — Encounter: Payer: Self-pay | Admitting: Internal Medicine

## 2013-01-04 ENCOUNTER — Telehealth: Payer: Self-pay | Admitting: Internal Medicine

## 2013-01-04 NOTE — Telephone Encounter (Signed)
Answered questions.  

## 2013-01-04 NOTE — Telephone Encounter (Signed)
Pt would like bonnye to return her call. Pt was sent to ER on 01-02-13

## 2013-01-06 ENCOUNTER — Other Ambulatory Visit: Payer: Medicare PPO

## 2013-01-17 ENCOUNTER — Ambulatory Visit (AMBULATORY_SURGERY_CENTER): Payer: Medicare PPO | Admitting: *Deleted

## 2013-01-17 VITALS — Ht 67.0 in | Wt 127.2 lb

## 2013-01-17 DIAGNOSIS — D509 Iron deficiency anemia, unspecified: Secondary | ICD-10-CM

## 2013-01-17 MED ORDER — MOVIPREP 100 G PO SOLR: ORAL | Status: DC

## 2013-01-19 ENCOUNTER — Encounter: Payer: Self-pay | Admitting: Internal Medicine

## 2013-01-25 ENCOUNTER — Encounter: Payer: Self-pay | Admitting: Internal Medicine

## 2013-01-25 ENCOUNTER — Ambulatory Visit (AMBULATORY_SURGERY_CENTER): Payer: Medicare PPO | Admitting: Internal Medicine

## 2013-01-25 VITALS — BP 192/82 | HR 47 | Temp 95.8°F | Resp 23 | Ht 67.0 in | Wt 127.0 lb

## 2013-01-25 DIAGNOSIS — K2960 Other gastritis without bleeding: Secondary | ICD-10-CM

## 2013-01-25 DIAGNOSIS — K2980 Duodenitis without bleeding: Secondary | ICD-10-CM

## 2013-01-25 DIAGNOSIS — D1330 Benign neoplasm of unspecified part of small intestine: Secondary | ICD-10-CM

## 2013-01-25 DIAGNOSIS — D509 Iron deficiency anemia, unspecified: Secondary | ICD-10-CM

## 2013-01-25 MED ORDER — SODIUM CHLORIDE 0.9 % IV SOLN: 500.0000 mL | INTRAVENOUS | Status: DC

## 2013-01-25 NOTE — Progress Notes (Signed)
A/ox3 pleased with MAC, report to Marie RN 

## 2013-01-25 NOTE — Patient Instructions (Addendum)
YOU HAD AN ENDOSCOPIC PROCEDURE TODAY AT [facility]: Refer to the procedure report that was given to you for any specific questions about what was found during the examination.  If the procedure report does not answer your questions, please call your gastroenterologist to clarify.  If you requested that your care partner not be given the details of your procedure findings, then the procedure report has been included in a sealed envelope for you to review at your convenience later.  YOU SHOULD EXPECT: Some feelings of bloating in the abdomen. Passage of more gas than usual.  Walking can help get rid of the air that was put into your GI tract during the procedure and reduce the bloating. If you had a lower endoscopy (such as a colonoscopy or flexible sigmoidoscopy) you may notice spotting of blood in your stool or on the toilet paper. If you underwent a bowel prep for your procedure, then you may not have a normal bowel movement for a few days.  DIET: Your first meal following the procedure should be a light meal and then it is ok to progress to your normal diet.  A half-sandwich or bowl of soup is an example of a good first meal.  Heavy or fried foods are harder to digest and may make you feel nauseous or bloated.  Likewise meals heavy in dairy and vegetables can cause extra gas to form and this can also increase the bloating.  Drink plenty of fluids but you should avoid alcoholic beverages for 24 hours.  ACTIVITY: Your care partner should take you home directly after the procedure.  You should plan to take it easy, moving slowly for the rest of the day.  You can resume normal activity the day after the procedure however you should NOT DRIVE or use heavy machinery for 24 hours (because of the sedation medicines used during the test).    SYMPTOMS TO REPORT IMMEDIATELY: A gastroenterologist can be reached at any hour.  During normal business hours, 8:30 AM to 5:00 PM Monday through Friday,  call [phone].  After hours and on weekends, please call the GI answering service at [phone]  Emergency number who will take a message and have the physician on call contact you.   Following lower endoscopy (colonoscopy or flexible sigmoidoscopy):  Excessive amounts of blood in the stool  Significant tenderness or worsening of abdominal pains  Swelling of the abdomen that is new, acute  Fever of 100F or higher  Following upper endoscopy (EGD)  Vomiting of blood or coffee ground material  New chest pain or pain under the shoulder blades  Painful or persistently difficult swallowing  New shortness of breath  Fever of 100F or higher  Black, tarry-looking stools  FOLLOW UP: If any biopsies were taken you will be contacted by phone or by letter within the next 1-3 weeks.  Call your gastroenterologist if you have not heard about the biopsies in 3 weeks.  Our staff will call the home number listed on your records the next business day following your procedure to check on you and address any questions or concerns that you may have at that time regarding the information given to you following your procedure. This is a courtesy call and so if there is no answer at the home number and we have not heard from you through the emergency physician on call, we will assume that you have returned to your regular daily activities without incident.  SIGNATURES/CONFIDENTIALITY: You  and/or your care partner have signed paperwork which will be entered into your electronic medical record.  These signatures attest to the fact that that the information above on your After Visit Summary has been reviewed and is understood.  Full responsibility of the confidentiality of this discharge information lies with you and/or your care-partner.  Handout on gastritis Continue iron supplements

## 2013-01-25 NOTE — Progress Notes (Signed)
Patient did not experience any of the following events: a burn prior to discharge; a fall within the facility; wrong site/side/patient/procedure/implant event; or a hospital transfer or hospital admission upon discharge from the facility. (G8907)Patient did not have preoperative order for IV antibiotic SSI prophylaxis. [phone]) emw  Pt noted to be in a fib with PAC's and heart rate in the 60's to 40's in the recovery room. Pt denies any chest pain, no discomfort, no problems ewm

## 2013-01-25 NOTE — Op Note (Signed)
[facility] [address] [place], [address]   ENDOSCOPY PROCEDURE REPORT  PATIENT: Megan Olsen, Megan Olsen  MR#: [id number] BIRTHDATE: 03-Jun-1928 , 85  yrs. old GENDER: Female ENDOSCOPIST: Hart Carwin, MD REFERRED BY:  Stacie Glaze, M.D. PROCEDURE DATE:  01/25/2013 PROCEDURE:  EGD w/ biopsy ASA CLASS:     Class II INDICATIONS:  Iron deficiency anemia.   Hgb 8.6, MCV 68, 14% iron saturation. MEDICATIONS: MAC sedation, administered by CRNA and propofol (Diprivan) 100mg  IV TOPICAL ANESTHETIC: none  DESCRIPTION OF PROCEDURE: After the risks benefits and alternatives of the procedure were thoroughly explained, informed consent was obtained.  The LB [id number] endoscope was introduced through the mouth and advanced to the second portion of the duodenum. Without limitations.  The instrument was slowly withdrawn as the mucosa was fully examined.      esophagus: The esophageal mucosa appeared normal in the proximal and mid and distal esophagus. There was no evidence of hiatal hernia or stricture. Z line appeared normal Stomach the stomach was insufflated with air and show absence of rugal folds. There were prominent submucosal blood vessels adjacent to atrophic gastritis. Gastric antrum was normal biopsies were taken to rule out atrophic gastritis. Pyloric outlet was normalDuodenum: Duodenal bulb and descending duodenum appeared normal biopsies were taken from the descending duodenum to rule out villous atrophy[.retroflexion of the endoscope revealed normal fundus and cardia         The scope was then withdrawn from the patient and the procedure completed.  COMPLICATIONS: There were no complications. ENDOSCOPIC IMPRESSION:   Nothing to account for iron deficiency anemia Atrophic-appearing gastric mucosa. Status post biopsies Status post biopsies from descending duodenum to rule out villous atrophy RECOMMENDATIONS:  proceed with colonoscopy Await  results of biopsies Continue iron supplements REPEAT EXAM: no  eSigned:  Hart Carwin, MD 01/25/2013 3:53 PM   CC:  PATIENT NAME:  Megan Olsen, Megan Olsen MR#: [id number]

## 2013-01-25 NOTE — Progress Notes (Signed)
Called to room to assist during endoscopic procedure.  Patient ID and intended procedure confirmed with present staff. Received instructions for my participation in the procedure from the performing physician.  

## 2013-01-25 NOTE — Op Note (Signed)
[facility] [address] [place], [address]   COLONOSCOPY PROCEDURE REPORT  PATIENT: Megan Olsen, Megan Olsen  MR#: [id number] BIRTHDATE: 1927/08/20 , 85  yrs. old GENDER: Female ENDOSCOPIST: Hart Carwin, MD REFERRED BY: PROCEDURE DATE:  01/25/2013 PROCEDURE:   Colonoscopy, screening ASA CLASS:   Class II INDICATIONS:Iron Deficiency Anemia. MEDICATIONS: MAC sedation, administered by CRNA and propofol (Diprivan) 100mg  IV  DESCRIPTION OF PROCEDURE:   After the risks benefits and alternatives of the procedure were thoroughly explained, informed consent was obtained.  A digital rectal exam revealed no abnormalities of the rectum.   The LB [id number]  endoscope was introduced through the anus and advanced to the cecum, which was identified by both the appendix and ileocecal valve. No adverse events experienced.   The quality of the prep was good, using MoviPrep  The instrument was then slowly withdrawn as the colon was fully examined.      COLON FINDINGS: A normal appearing cecum, ileocecal valve, and appendiceal orifice were identified.  The ascending, hepatic flexure, transverse, splenic flexure, descending, sigmoid colon and rectum appeared unremarkable.  No polyps or cancers were seen. Retroflexed views revealed no abnormalities. The time to cecum=  . Withdrawal time=  .  The scope was withdrawn and the procedure completed. COMPLICATIONS: There were no complications.  ENDOSCOPIC IMPRESSION: Normal colon , nothing to account for GI blood loss  RECOMMENDATIONS: high fiber diet follow H/H continue Iron supplements no recall due to age  eSigned:  Hart Carwin, MD 01/25/2013 3:58 PM   cc:

## 2013-01-26 ENCOUNTER — Telehealth: Payer: Self-pay | Admitting: *Deleted

## 2013-01-26 ENCOUNTER — Telehealth: Payer: Self-pay | Admitting: Internal Medicine

## 2013-01-26 NOTE — Telephone Encounter (Signed)
  Follow up Call-  Call back number 01/25/2013  Post procedure Call Back phone  # [phone]  Permission to leave phone message Yes     Patient questions:  Do you have a fever, pain , or abdominal swelling? no Pain Score  0 *  Have you tolerated food without any problems? yes  Have you been able to return to your normal activities? yes  Do you have any questions about your discharge instructions: Diet   no Medications  no Follow up visit  no  Do you have questions or concerns about your Care? no  Actions: * If pain score is 4 or above: No action needed, pain <4.

## 2013-01-26 NOTE — Telephone Encounter (Signed)
It is hgb and it is scheduled for tomorrow-pt is anemmic

## 2013-01-26 NOTE — Telephone Encounter (Signed)
Pt states Dr Julio Alm' asked that pt have a a1c (hemoglobin) test done at our office. Is it ok to schedule?Marland Kitchen

## 2013-01-27 ENCOUNTER — Other Ambulatory Visit (INDEPENDENT_AMBULATORY_CARE_PROVIDER_SITE_OTHER): Payer: Medicare PPO

## 2013-01-27 DIAGNOSIS — D649 Anemia, unspecified: Secondary | ICD-10-CM

## 2013-01-27 LAB — CBC WITH DIFFERENTIAL/PLATELET
Basophils Relative: 0.4 % (ref 0.0–3.0)
HCT: 34.2 % — ABNORMAL LOW (ref 36.0–46.0)
Hemoglobin: 11.1 g/dL — ABNORMAL LOW (ref 12.0–15.0)
Lymphocytes Relative: 18.6 % (ref 12.0–46.0)
Lymphs Abs: 1.1 10*3/uL (ref 0.7–4.0)
MCHC: 32.3 g/dL (ref 30.0–36.0)
Monocytes Relative: 9.1 % (ref 3.0–12.0)
Neutro Abs: 3.9 10*3/uL (ref 1.4–7.7)
RBC: 4.29 Mil/uL (ref 3.87–5.11)

## 2013-01-31 ENCOUNTER — Telehealth: Payer: Self-pay | Admitting: Internal Medicine

## 2013-01-31 NOTE — Telephone Encounter (Signed)
hgb improved- now up to 11.1--dr brodie had told her she could stop prilosec. Is it ok to stop?

## 2013-01-31 NOTE — Telephone Encounter (Signed)
PT is calling to obtain her lab results from 01/27/13. Please assist.

## 2013-02-01 ENCOUNTER — Encounter: Payer: Self-pay | Admitting: Internal Medicine

## 2013-02-02 ENCOUNTER — Other Ambulatory Visit: Payer: Self-pay | Admitting: *Deleted

## 2013-02-02 MED ORDER — CLARITHROMYCIN 500 MG PO TABS: ORAL_TABLET | ORAL | Status: DC

## 2013-02-02 MED ORDER — DEXLANSOPRAZOLE 60 MG PO CPDR: DELAYED_RELEASE_CAPSULE | ORAL | Status: DC

## 2013-02-02 MED ORDER — METRONIDAZOLE 250 MG PO TABS: ORAL_TABLET | ORAL | Status: DC

## 2013-02-06 ENCOUNTER — Telehealth: Payer: Self-pay | Admitting: Internal Medicine

## 2013-02-06 NOTE — Telephone Encounter (Signed)
Spoke with patient and she started the antibiotics on Friday(Biaxin, Flagyl). Saturday she had nausea, vomiting and diarrhea. She continued with diarrhea and nausea on Sunday. She stopped the antibiotics on Sunday. She has not had any diarrhea or nausea today just feels weak. Please, advise.

## 2013-02-06 NOTE — Telephone Encounter (Signed)
OK to use Pylora

## 2013-02-06 NOTE — Telephone Encounter (Signed)
We are running out of options here:ppeptobismol 2 po qid x 10 days ( black stools), Tetracycline 500 mg po tid and PPI bis x 10 days.

## 2013-02-06 NOTE — Telephone Encounter (Signed)
Unable to order Tetracycline. Can she try Pylera?

## 2013-02-07 MED ORDER — BIS SUBCIT-METRONID-TETRACYC 140-125-125 MG PO CAPS: 3.0000 | ORAL_CAPSULE | Freq: Three times a day (TID) | ORAL | Status: DC

## 2013-02-07 NOTE — Telephone Encounter (Signed)
.  Sent rx to pharmacy. Patient aware. She will call me back if the medication is too expensive.

## 2013-02-13 ENCOUNTER — Telehealth: Payer: Self-pay | Admitting: Internal Medicine

## 2013-02-13 NOTE — Telephone Encounter (Signed)
Patient Information:  Caller Name: [name]  Phone: [phone]  Patient: Megan Olsen, Megan Olsen  Gender: Female  DOB: Jan 20, 1928  Age: 79 Years  PCP: Darryll Capers (Adults only)  Office Follow Up:  Does the office need to follow up with this patient?: No  Instructions For The Office: N/A  RN Note:  Pt is currently being treated for H Pylori, started Pylera on 7-22, after switching from 2 other antibiotics: Flagyl and Biaxin. Pt was advised Meloxicam could cause her dizziness by her Pharmacist, Pt has been on Meloxicam for years per Pt.  Pt has dizziness upon standing and when closing her eyes. Appt offered.  Pt unable to come in same day, requests appt for 7-29.  No availability w/ Dr Lovell Sheehan or Lennon Alstrom. Appt scheduled on 7-29 at 11am w/ Clent Ridges per Pt's request.   Symptoms  Reason For Call & Symptoms: Dizziness  Reviewed Health History In EMR: Yes  Reviewed Medications In EMR: Yes  Reviewed Allergies In EMR: Yes  Reviewed Surgeries / Procedures: Yes  Date of Onset of Symptoms: 02/09/2013  Guideline(s) Used:  Dizziness  Disposition Per Guideline:   See Today in Office  Reason For Disposition Reached:   Patient wants to be seen  Advice Given:  N/A  Patient Will Follow Care Advice:  YES

## 2013-02-14 ENCOUNTER — Ambulatory Visit (INDEPENDENT_AMBULATORY_CARE_PROVIDER_SITE_OTHER): Payer: Medicare PPO | Admitting: Family Medicine

## 2013-02-14 ENCOUNTER — Encounter: Payer: Self-pay | Admitting: Family Medicine

## 2013-02-14 VITALS — BP 140/80 | HR 87 | Temp 97.4°F | Wt 126.0 lb

## 2013-02-14 DIAGNOSIS — R42 Dizziness and giddiness: Secondary | ICD-10-CM

## 2013-02-14 MED ORDER — MECLIZINE HCL 25 MG PO TABS: 25.0000 mg | ORAL_TABLET | Freq: Four times a day (QID) | ORAL | Status: DC | PRN

## 2013-02-14 MED ORDER — TRAMADOL HCL 50 MG PO TABS: 50.0000 mg | ORAL_TABLET | Freq: Four times a day (QID) | ORAL | Status: DC | PRN

## 2013-02-14 NOTE — Progress Notes (Signed)
  Subjective:    Patient ID: Megan Olsen, female    DOB: 1927/12/12, 79 y.o.   MRN: [id number]  HPI Here to discuss dizziness, which has bothered her off and on for the past 5 days. This feels like the room is spinning, and it only bothers her when she stands up from a lying or sitting position. When standing still or sitting, she feels fine. No HA or vision problems. She has been taking antibiotics per Dr. Juanda Chance to treat H pylori. She has been taking Mobic since April for arthritis pains, and her pharmacist seems to think that this is the cause of her dizziness.    Review of Systems  Constitutional: Negative.   HENT: Negative.   Eyes: Negative.   Respiratory: Negative.   Cardiovascular: Negative.   Neurological: Positive for dizziness. Negative for tremors, seizures, syncope, facial asymmetry, speech difficulty, weakness, light-headedness, numbness and headaches.       Objective:   Physical Exam  Constitutional: She is oriented to person, place, and time. She appears well-developed and well-nourished. No distress.  HENT:  Head: Normocephalic and atraumatic.  Right Ear: External ear normal.  Left Ear: External ear normal.  Nose: Nose normal.  Mouth/Throat: Oropharynx is clear and moist.  Eyes: Conjunctivae and EOM are normal. Pupils are equal, round, and reactive to light.  Neck: Neck supple. No thyromegaly present.  Cardiovascular: Normal rate, regular rhythm, normal heart sounds and intact distal pulses.   Pulmonary/Chest: Effort normal and breath sounds normal.  Lymphadenopathy:    She has no cervical adenopathy.  Neurological: She is oriented to person, place, and time. She has normal reflexes. No cranial nerve deficit. She exhibits normal muscle tone. Coordination normal.          Assessment & Plan:  This sounds like vertigo. I doubt the Mobic is the cause, but we will try stopping this and using Tramadol instead for pain. Use Meclizine prn. Finish out the  antibiotics.

## 2013-02-16 ENCOUNTER — Other Ambulatory Visit: Payer: Self-pay | Admitting: Internal Medicine

## 2013-04-02 ENCOUNTER — Other Ambulatory Visit: Payer: Self-pay | Admitting: Internal Medicine

## 2013-04-10 ENCOUNTER — Encounter: Payer: Self-pay | Admitting: Internal Medicine

## 2013-04-10 ENCOUNTER — Telehealth: Payer: Self-pay | Admitting: Internal Medicine

## 2013-04-10 ENCOUNTER — Ambulatory Visit (INDEPENDENT_AMBULATORY_CARE_PROVIDER_SITE_OTHER): Payer: Medicare PPO | Admitting: Internal Medicine

## 2013-04-10 VITALS — BP 140/80 | HR 72 | Temp 98.0°F | Resp 16 | Ht 67.0 in | Wt 125.0 lb

## 2013-04-10 DIAGNOSIS — F329 Major depressive disorder, single episode, unspecified: Secondary | ICD-10-CM

## 2013-04-10 DIAGNOSIS — K219 Gastro-esophageal reflux disease without esophagitis: Secondary | ICD-10-CM

## 2013-04-10 DIAGNOSIS — F3289 Other specified depressive episodes: Secondary | ICD-10-CM

## 2013-04-10 DIAGNOSIS — Z23 Encounter for immunization: Secondary | ICD-10-CM

## 2013-04-10 DIAGNOSIS — I4891 Unspecified atrial fibrillation: Secondary | ICD-10-CM

## 2013-04-10 DIAGNOSIS — I10 Essential (primary) hypertension: Secondary | ICD-10-CM

## 2013-04-10 DIAGNOSIS — D509 Iron deficiency anemia, unspecified: Secondary | ICD-10-CM

## 2013-04-10 LAB — CBC WITH DIFFERENTIAL/PLATELET
Basophils Absolute: 0 10*3/uL (ref 0.0–0.1)
Eosinophils Absolute: 0.3 10*3/uL (ref 0.0–0.7)
Lymphocytes Relative: 19.2 % (ref 12.0–46.0)
MCHC: 33.3 g/dL (ref 30.0–36.0)
MCV: 87.5 fl (ref 78.0–100.0)
Monocytes Absolute: 0.5 10*3/uL (ref 0.1–1.0)
Neutrophils Relative %: 66.2 % (ref 43.0–77.0)
Platelets: 176 10*3/uL (ref 150.0–400.0)
RDW: 21.4 % — ABNORMAL HIGH (ref 11.5–14.6)

## 2013-04-10 LAB — IRON: Iron: 61 ug/dL (ref 42–145)

## 2013-04-10 LAB — BASIC METABOLIC PANEL
Calcium: 9 mg/dL (ref 8.4–10.5)
GFR: 95.83 mL/min (ref >60.00)
Glucose, Bld: 76 mg/dL (ref 70–99)
Sodium: 138 mEq/L (ref 135–145)

## 2013-04-10 NOTE — Telephone Encounter (Signed)
done

## 2013-04-10 NOTE — Progress Notes (Signed)
Subjective:    Patient ID: Megan Olsen, female    DOB: 1928/03/28, 79 y.o.   MRN: [id number]  HPI Routine follow up Primary CC lack on energy AF , HTN and GERD stable Has been going to the [facility] with a Trainor for flexibility Mild anxiety Did not move to assisted living due to scheduled changed to a multivitamin from individual supplements Blood pressures stable     Review of Systems  HENT: Positive for congestion and rhinorrhea.   Eyes: Negative.   Respiratory: Negative.   Cardiovascular: Positive for palpitations and leg swelling. Negative for chest pain.  Gastrointestinal: Negative.   Endocrine: Negative.   Genitourinary: Positive for frequency.  Neurological: Positive for weakness and light-headedness.   Past Medical History  Diagnosis Date  . GERD (gastroesophageal reflux disease)   . Depression   . Osteoporosis   . Atrial fibrillation     catheter ablation of SVT in 2002  . Osteoarthritis   . CVA (cerebrovascular accident) 2003    left brain  . Iron deficiency anemia     History   Social History  . Marital Status: Single    Spouse Name: N/A    Number of Children: N/A  . Years of Education: N/A   Occupational History  . Not on file.   Social History Main Topics  . Smoking status: Never Smoker   . Smokeless tobacco: Never Used  . Alcohol Use: No  . Drug Use: No  . Sexual Activity: No   Other Topics Concern  . Not on file   Social History Narrative  . No narrative on file    Past Surgical History  Procedure Laterality Date  . Cholecystectomy    . Abdominal hysterectomy    . Total abdominal hysterectomy    . Right eye surgery  2008    dr Luciana Axe.  Vitrectomy and removal of tissue.   . Colonoscopy    . Laparoscopic ovarian cystectomy      No family history on file.  Allergies  Allergen Reactions  . Fexofenadine     REACTION: Rash  . Penicillins     REACTION: Hives rash  . Propoxyphene-Acetaminophen Nausea Only  . Celecoxib  Itching and Rash    Current Outpatient Prescriptions on File Prior to Visit  Medication Sig Dispense Refill  . aspirin 325 MG tablet Take 325 mg by mouth daily.      . bisoprolol-hydrochlorothiazide (ZIAC) 5-6.25 MG per tablet Take 1 tablet by mouth daily.      Marland Kitchen dexlansoprazole (DEXILANT) 60 MG capsule Take one po bid x 10 days then take daily  49 capsule  1  . estradiol (ESTRACE) 1 MG tablet TAKE 1 TABLET (1 MG TOTAL) BY MOUTH DAILY.  90 tablet  3  . fluticasone (FLONASE) 50 MCG/ACT nasal spray Place 2 sprays into the nose daily.      Marland Kitchen LORazepam (ATIVAN) 1 MG tablet TAKE 1/2 TABLET BY MOUTH IN AM, THEN 1 TABLET AT BEDTIME  45 tablet  5  . magnesium oxide (MAG-OX) 400 MG tablet Take 400 mg by mouth daily.      . meclizine (ANTIVERT) 25 MG tablet Take 1 tablet (25 mg total) by mouth every 6 (six) hours as needed for dizziness.  60 tablet  2  . meloxicam (MOBIC) 15 MG tablet Take 15 mg by mouth daily.      . Multiple Vitamins-Minerals (ICAPS LUTEIN & ZEAXANTHIN PO) Take 1 tablet by mouth daily.      Marland Kitchen  traMADol (ULTRAM) 50 MG tablet Take 1 tablet (50 mg total) by mouth every 6 (six) hours as needed for pain.  60 tablet  2   No current facility-administered medications on file prior to visit.    BP 140/80  Pulse 72  Temp(Src) 98 F (36.7 C)  Resp 16  Ht 5\' 7"  (1.702 m)  Wt 125 lb (56.7 kg)  BMI 19.57 kg/m2       Objective:   Physical Exam  Constitutional: She is oriented to person, place, and time. She appears well-developed and well-nourished. No distress.  HENT:  Head: Normocephalic and atraumatic.  Nose: Nose normal.  Eyes: Conjunctivae and EOM are normal. Pupils are equal, round, and reactive to light.  Neck: Normal range of motion. Neck supple. No JVD present. No tracheal deviation present. No thyromegaly present.  Cardiovascular: Intact distal pulses.   Murmur heard. Irregular with controlled VR  Pulmonary/Chest: Effort normal and breath sounds normal. She has no  wheezes. She exhibits no tenderness.  Abdominal: Soft. Bowel sounds are normal.  Musculoskeletal: Normal range of motion. She exhibits edema and tenderness.  Lymphadenopathy:    She has no cervical adenopathy.  Neurological: She is alert and oriented to person, place, and time. She has normal reflexes. No cranial nerve deficit.  Skin: Skin is warm and dry. She is not diaphoretic.  Psychiatric: She has a normal mood and affect. Her behavior is normal.          Assessment & Plan:  Stable AF rate controlled GERD stable Mood still a chalange but wants to live at home Hx of anemia with improved Hgb reckeck today

## 2013-04-10 NOTE — Patient Instructions (Signed)
The patient is instructed to continue all medications as prescribed. Schedule followup with check out clerk upon leaving the clinic  

## 2013-04-10 NOTE — Telephone Encounter (Signed)
Pt states she was seen today and in reviewing her AVS she noticed Aspirin listed on med list.  Pt states she is not longer taking aspirin as it was d/c when she was seen in the ED in June, please update chart.

## 2013-04-18 ENCOUNTER — Other Ambulatory Visit: Payer: Self-pay | Admitting: *Deleted

## 2013-04-18 MED ORDER — MELOXICAM 15 MG PO TABS: 15.0000 mg | ORAL_TABLET | Freq: Every day | ORAL | Status: DC

## 2013-04-19 ENCOUNTER — Telehealth: Payer: Self-pay | Admitting: Internal Medicine

## 2013-04-19 NOTE — Telephone Encounter (Signed)
improved

## 2013-04-19 NOTE — Telephone Encounter (Signed)
Pt would like blood work results °

## 2013-05-19 ENCOUNTER — Other Ambulatory Visit: Payer: Self-pay | Admitting: *Deleted

## 2013-05-19 MED ORDER — BISOPROLOL-HYDROCHLOROTHIAZIDE 5-6.25 MG PO TABS: 1.0000 | ORAL_TABLET | Freq: Every day | ORAL | Status: DC

## 2013-05-19 MED ORDER — MELOXICAM 15 MG PO TABS: 15.0000 mg | ORAL_TABLET | Freq: Every day | ORAL | Status: DC

## 2013-05-19 MED ORDER — ESTRADIOL 1 MG PO TABS: ORAL_TABLET | ORAL | Status: DC

## 2013-05-23 ENCOUNTER — Other Ambulatory Visit: Payer: Self-pay | Admitting: *Deleted

## 2013-05-23 MED ORDER — LORAZEPAM 1 MG PO TABS: ORAL_TABLET | ORAL | Status: DC

## 2013-05-31 ENCOUNTER — Other Ambulatory Visit: Payer: Self-pay | Admitting: *Deleted

## 2013-05-31 MED ORDER — DEXLANSOPRAZOLE 60 MG PO CPDR: 60.0000 mg | DELAYED_RELEASE_CAPSULE | Freq: Every day | ORAL | Status: DC

## 2013-08-11 ENCOUNTER — Encounter: Payer: Self-pay | Admitting: Internal Medicine

## 2013-08-11 ENCOUNTER — Ambulatory Visit (INDEPENDENT_AMBULATORY_CARE_PROVIDER_SITE_OTHER): Payer: Medicare HMO | Admitting: Internal Medicine

## 2013-08-11 VITALS — BP 140/80 | HR 72 | Temp 98.2°F | Resp 16 | Ht 67.0 in | Wt 128.0 lb

## 2013-08-11 DIAGNOSIS — D509 Iron deficiency anemia, unspecified: Secondary | ICD-10-CM

## 2013-08-11 DIAGNOSIS — I4891 Unspecified atrial fibrillation: Secondary | ICD-10-CM

## 2013-08-11 DIAGNOSIS — J309 Allergic rhinitis, unspecified: Secondary | ICD-10-CM

## 2013-08-11 LAB — CBC WITH DIFFERENTIAL/PLATELET
Basophils Absolute: 0 10*3/uL (ref 0.0–0.1)
Basophils Relative: 1 % (ref 0–1)
EOS ABS: 0.4 10*3/uL (ref 0.0–0.7)
EOS PCT: 7 % — AB (ref 0–5)
HCT: 38.9 % (ref 36.0–46.0)
HEMOGLOBIN: 13.3 g/dL (ref 12.0–15.0)
LYMPHS ABS: 1 10*3/uL (ref 0.7–4.0)
Lymphocytes Relative: 17 % (ref 12–46)
MCH: 31.3 pg (ref 26.0–34.0)
MCHC: 34.2 g/dL (ref 30.0–36.0)
MCV: 91.5 fL (ref 78.0–100.0)
MONOS PCT: 11 % (ref 3–12)
Monocytes Absolute: 0.6 10*3/uL (ref 0.1–1.0)
Neutro Abs: 3.9 10*3/uL (ref 1.7–7.7)
Neutrophils Relative %: 64 % (ref 43–77)
PLATELETS: 170 10*3/uL (ref 150–400)
RBC: 4.25 MIL/uL (ref 3.87–5.11)
RDW: 13.1 % (ref 11.5–15.5)
WBC: 5.9 10*3/uL (ref 4.0–10.5)

## 2013-08-11 MED ORDER — IPRATROPIUM BROMIDE 0.03 % NA SOLN: 2.0000 | Freq: Two times a day (BID) | NASAL | Status: DC

## 2013-08-11 NOTE — Progress Notes (Signed)
Subjective:    Patient ID: Megan Olsen, female    DOB: [birth year], 80 y.o.   MRN: [id number]  HPI Vasomotor rhinitis with nasal congestion.  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  HENT: Positive for hearing loss, postnasal drip, rhinorrhea and sinus pressure. Negative for congestion and ear pain.   Eyes: Negative for redness and visual disturbance.  Respiratory: Negative for cough, shortness of breath and wheezing.   Gastrointestinal: Negative for abdominal pain and abdominal distention.  Genitourinary: Negative for dysuria, frequency and menstrual problem.  Musculoskeletal: Negative for arthralgias, joint swelling, myalgias and neck pain.  Skin: Negative for rash and wound.  Neurological: Negative for dizziness, weakness and headaches.  Hematological: Negative for adenopathy. Does not bruise/bleed easily.  Psychiatric/Behavioral: Negative for sleep disturbance and decreased concentration.   Past Medical History  Diagnosis Date  . GERD (gastroesophageal reflux disease)   . Depression   . Osteoporosis   . Atrial fibrillation     catheter ablation of SVT in 2002  . Osteoarthritis   . CVA (cerebrovascular accident) 2003    left brain  . Iron deficiency anemia     History   Social History  . Marital Status: Single    Spouse Name: N/A    Number of Children: N/A  . Years of Education: N/A   Occupational History  . Not on file.   Social History Main Topics  . Smoking status: Never Smoker   . Smokeless tobacco: Never Used  . Alcohol Use: No  . Drug Use: No  . Sexual Activity: No   Other Topics Concern  . Not on file   Social History Narrative  . No narrative on file    Past Surgical History  Procedure Laterality Date  . Cholecystectomy    . Abdominal hysterectomy    . Total abdominal  hysterectomy    . Right eye surgery  2008    dr Zadie Rhine.  Vitrectomy and removal of tissue.   . Colonoscopy    . Laparoscopic ovarian cystectomy      No family history on file.  Allergies  Allergen Reactions  . Fexofenadine     REACTION: Rash  . Penicillins     REACTION: Hives rash  . Propoxyphene N-Acetaminophen Nausea Only  . Celecoxib Itching and Rash    Current Outpatient Prescriptions on File Prior to Visit  Medication Sig Dispense Refill  . bisoprolol-hydrochlorothiazide (ZIAC) 5-6.25 MG per tablet Take 1 tablet by mouth daily.  90 tablet  3  . dexlansoprazole (DEXILANT) 60 MG capsule Take 1 capsule (60 mg total) by mouth daily.  90 capsule  3  . estradiol (ESTRACE) 1 MG tablet TAKE 1 TABLET (1 MG TOTAL) BY MOUTH DAILY.  90 tablet  3  . fluticasone (FLONASE) 50 MCG/ACT nasal spray Place 2 sprays into the nose daily.      Marland Kitchen LORazepam (ATIVAN) 1 MG tablet TAKE 1/2 TABLET BY MOUTH IN AM, THEN 1 TABLET AT BEDTIME  135 tablet  1  . magnesium oxide (MAG-OX) 400 MG tablet Take 400 mg by mouth daily.      . meclizine (ANTIVERT) 25 MG tablet Take 1 tablet (25 mg total) by mouth every 6 (six) hours as needed for dizziness.  60 tablet  2  . meloxicam (MOBIC) 15 MG tablet  Take 1 tablet (15 mg total) by mouth daily.  90 tablet  3  . Multiple Vitamins-Minerals (ICAPS LUTEIN & ZEAXANTHIN PO) Take 1 tablet by mouth daily.      . traMADol (ULTRAM) 50 MG tablet Take 1 tablet (50 mg total) by mouth every 6 (six) hours as needed for pain.  60 tablet  2   No current facility-administered medications on file prior to visit.    BP 140/80  Pulse 72  Temp(Src) 98.2 F (36.8 C)  Resp 16  Ht 5\' 7"  (1.702 m)  Wt 128 lb (58.06 kg)  BMI 20.04 kg/m2        Objective:   Physical Exam  Nursing note and vitals reviewed. Constitutional: She appears well-developed and well-nourished.  HENT:  Head: Normocephalic and atraumatic.  Severe PNdrip and red ness   Eyes: Conjunctivae are normal.  Pupils are equal, round, and reactive to light.  Cardiovascular: Normal rate and regular rhythm.   Pulmonary/Chest: Effort normal and breath sounds normal.  Abdominal: Soft. Bowel sounds are normal.  Musculoskeletal: Normal range of motion.  Psychiatric:  Can read a clock and can reverse words          Assessment & Plan:  Trial of depomedrol and nasal atropine  Stable GERD Stable HTN

## 2013-08-11 NOTE — Patient Instructions (Signed)
The patient is instructed to continue all medications as prescribed. Schedule followup with check out clerk upon leaving the clinic  

## 2013-08-14 MED ORDER — METHYLPREDNISOLONE ACETATE 80 MG/ML IJ SUSP
120.0000 mg | Freq: Once | INTRAMUSCULAR | Status: DC
  Administered 2013-08-11: 120 mg via INTRAMUSCULAR

## 2013-08-14 MED ORDER — METHYLPREDNISOLONE ACETATE 80 MG/ML IJ SUSP: 80.0000 mg | Freq: Every day | INTRAMUSCULAR | Status: DC

## 2013-08-14 NOTE — Addendum Note (Signed)
Addended by: Allyne Gee on: 08/14/2013 04:53 PM   Modules accepted: Orders

## 2013-10-10 ENCOUNTER — Telehealth: Payer: Self-pay | Admitting: Internal Medicine

## 2013-10-10 MED ORDER — LORAZEPAM 1 MG PO TABS: ORAL_TABLET | ORAL | Status: DC

## 2013-10-10 NOTE — Telephone Encounter (Signed)
[facility] [address] requesting re-fill on LORazepam (ATIVAN) 1 MG tablet

## 2013-10-10 NOTE — Telephone Encounter (Signed)
Ok per Dr Arnoldo Morale, rx faxed to [facility]

## 2014-02-06 ENCOUNTER — Other Ambulatory Visit: Payer: Self-pay | Admitting: Internal Medicine

## 2014-02-08 NOTE — Telephone Encounter (Signed)
Re-fill request received for below medication

## 2014-02-09 ENCOUNTER — Telehealth: Payer: Self-pay | Admitting: Internal Medicine

## 2014-02-09 MED ORDER — TRAMADOL HCL 50 MG PO TABS: 50.0000 mg | ORAL_TABLET | Freq: Four times a day (QID) | ORAL | Status: DC | PRN

## 2014-02-09 NOTE — Telephone Encounter (Signed)
Pt is calling needs refill on tramadol  [facility]

## 2014-02-09 NOTE — Telephone Encounter (Signed)
okx2 per Dr Arnoldo Morale, rx called in to [facility]

## 2014-02-16 ENCOUNTER — Encounter: Payer: Self-pay | Admitting: Internal Medicine

## 2014-03-27 ENCOUNTER — Telehealth: Payer: Self-pay | Admitting: Internal Medicine

## 2014-03-27 NOTE — Telephone Encounter (Signed)
ok 

## 2014-03-27 NOTE — Telephone Encounter (Signed)
Former Megan Olsen pt who is requesting to establish with an experienced internist.  Wants to know if you would be willing to accept her as a new pt.

## 2014-03-30 ENCOUNTER — Ambulatory Visit (INDEPENDENT_AMBULATORY_CARE_PROVIDER_SITE_OTHER): Payer: Medicare HMO

## 2014-03-30 DIAGNOSIS — Z23 Encounter for immunization: Secondary | ICD-10-CM

## 2014-04-02 NOTE — Telephone Encounter (Signed)
Pt has been sch with dr Raliegh Ip and appt with dr hunter has been cancelled

## 2014-04-09 ENCOUNTER — Telehealth: Payer: Self-pay | Admitting: Internal Medicine

## 2014-04-09 NOTE — Telephone Encounter (Signed)
Pt needs referral due to [facility] for her eye dr. Dr Baldemar Lenis [phone]  Fax [phone] Pt has ongoing eye issues and has seen this eye dr many times. Pt had appt on sat and they refused to see b/c of the needed referral. Pt will wait to make another appt after she knows referral is done

## 2014-04-10 NOTE — Telephone Encounter (Signed)
[facility]: Johna Sheriff MD  Ophthalmologist  Address: [address], [place],  [address]  [phone] Done authorization # [id number]  04/11/2014 - 10/08/2014

## 2014-04-18 ENCOUNTER — Telehealth: Payer: Self-pay | Admitting: Internal Medicine

## 2014-04-18 NOTE — Telephone Encounter (Signed)
Approval done  For pt  Authorization # [id number]/19/2014 - 10/16/2014 [facility]: Hurman Horn MD   Address: [address] [place], [place] [address]  [phone]

## 2014-04-18 NOTE — Telephone Encounter (Signed)
Type of Insurance: [employer] ins  Do we have a copy of Insurance Card on File? 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

## 2014-05-07 ENCOUNTER — Telehealth: Payer: Self-pay | Admitting: Internal Medicine

## 2014-05-07 NOTE — Telephone Encounter (Signed)
[facility] - [place], [place] - [address] is requesting re-fill on LORazepam (ATIVAN) 1 MG tablet ° °

## 2014-05-08 MED ORDER — ESTRADIOL 1 MG PO TABS: ORAL_TABLET | ORAL | Status: DC

## 2014-05-08 MED ORDER — LORAZEPAM 1 MG PO TABS: ORAL_TABLET | ORAL | Status: DC

## 2014-05-08 NOTE — Telephone Encounter (Signed)
rx sent in electronically 

## 2014-05-08 NOTE — Telephone Encounter (Signed)
Please add estradiol (ESTRACE) 1 MG tablet to the below re-fill request

## 2014-06-12 ENCOUNTER — Ambulatory Visit (INDEPENDENT_AMBULATORY_CARE_PROVIDER_SITE_OTHER): Payer: Commercial Managed Care - HMO | Admitting: Internal Medicine

## 2014-06-12 ENCOUNTER — Encounter: Payer: Self-pay | Admitting: Internal Medicine

## 2014-06-12 VITALS — BP 122/82 | HR 60 | Temp 98.6°F | Ht 64.0 in | Wt 137.0 lb

## 2014-06-12 DIAGNOSIS — M150 Primary generalized (osteo)arthritis: Secondary | ICD-10-CM

## 2014-06-12 DIAGNOSIS — M810 Age-related osteoporosis without current pathological fracture: Secondary | ICD-10-CM

## 2014-06-12 DIAGNOSIS — J3089 Other allergic rhinitis: Secondary | ICD-10-CM

## 2014-06-12 DIAGNOSIS — M159 Polyosteoarthritis, unspecified: Secondary | ICD-10-CM

## 2014-06-12 DIAGNOSIS — I480 Paroxysmal atrial fibrillation: Secondary | ICD-10-CM

## 2014-06-12 LAB — CBC WITH DIFFERENTIAL/PLATELET
BASOS PCT: 0.6 % (ref 0.0–3.0)
Basophils Absolute: 0 10*3/uL (ref 0.0–0.1)
EOS PCT: 5.8 % — AB (ref 0.0–5.0)
Eosinophils Absolute: 0.4 10*3/uL (ref 0.0–0.7)
HEMATOCRIT: 38.8 % (ref 36.0–46.0)
HEMOGLOBIN: 12.5 g/dL (ref 12.0–15.0)
LYMPHS ABS: 1 10*3/uL (ref 0.7–4.0)
Lymphocytes Relative: 14.4 % (ref 12.0–46.0)
MCHC: 32.3 g/dL (ref 30.0–36.0)
MCV: 94.8 fl (ref 78.0–100.0)
MONO ABS: 0.7 10*3/uL (ref 0.1–1.0)
Monocytes Relative: 10 % (ref 3.0–12.0)
NEUTROS ABS: 4.7 10*3/uL (ref 1.4–7.7)
Neutrophils Relative %: 69.2 % (ref 43.0–77.0)
Platelets: 160 10*3/uL (ref 150.0–400.0)
RBC: 4.09 Mil/uL (ref 3.87–5.11)
RDW: 13.7 % (ref 11.5–15.5)
WBC: 6.8 10*3/uL (ref 4.0–10.5)

## 2014-06-12 LAB — COMPREHENSIVE METABOLIC PANEL
ALK PHOS: 131 U/L — AB (ref 39–117)
ALT: 16 U/L (ref 0–35)
AST: 22 U/L (ref 0–37)
Albumin: 3.8 g/dL (ref 3.5–5.2)
BILIRUBIN TOTAL: 0.4 mg/dL (ref 0.2–1.2)
BUN: 18 mg/dL (ref 6–23)
CO2: 29 mEq/L (ref 19–32)
Calcium: 8.9 mg/dL (ref 8.4–10.5)
Chloride: 102 mEq/L (ref 96–112)
Creatinine, Ser: 0.7 mg/dL (ref 0.4–1.2)
GFR: 90.56 mL/min (ref >60.00)
Glucose, Bld: 67 mg/dL — ABNORMAL LOW (ref 70–99)
Potassium: 4.4 mEq/L (ref 3.5–5.1)
SODIUM: 142 meq/L (ref 135–145)
Total Protein: 6.7 g/dL (ref 6.0–8.3)

## 2014-06-12 LAB — TSH: TSH: 4.91 u[IU]/mL — ABNORMAL HIGH (ref 0.35–4.50)

## 2014-06-12 NOTE — Progress Notes (Signed)
Subjective:    Patient ID: Megan Olsen, female    DOB: [birth year], 80 y.o.   MRN: [id number]  HPI 80 year old patient who is seen today to establish with my practice.  She does remarkably well and has not been seen in approximately 10 months.  In 2002, she underwent RFA for tachyarrhythmias.  She was hospitalized in December 8786 for an embolic stroke.  She was placed on Coumadin anticoagulation for some time and then change to aspirin therapy.  This was discontinued in 2014 when she had GI bleeding secondary to H. pylori ulcer disease.  She remains on PPI therapy.  No concerns or complaints today She is followed by general ophthalmology as well as Dr. Zadie Rhine for macular degeneration and apparently some retinal hemorrhages.  Past Medical History  Diagnosis Date  . GERD (gastroesophageal reflux disease)   . Depression   . Osteoporosis   . Atrial fibrillation     catheter ablation of SVT in 2002  . Osteoarthritis   . CVA (cerebrovascular accident) 2003    left brain  . Iron deficiency anemia     History   Social History  . Marital Status: Single    Spouse Name: N/A    Number of Children: N/A  . Years of Education: N/A   Occupational History  . Not on file.   Social History Main Topics  . Smoking status: Never Smoker   . Smokeless tobacco: Never Used  . Alcohol Use: No  . Drug Use: No  . Sexual Activity: No   Other Topics Concern  . Not on file   Social History Narrative    Past Surgical History  Procedure Laterality Date  . Cholecystectomy    . Abdominal hysterectomy    . Total abdominal hysterectomy    . Right eye surgery  2008    dr Zadie Rhine.  Vitrectomy and removal of tissue.   . Colonoscopy    . Laparoscopic ovarian cystectomy      History reviewed. No pertinent family history.  Allergies  Allergen Reactions  . Fexofenadine     REACTION: Rash  . Penicillins     REACTION: Hives rash  . Propoxyphene N-Acetaminophen Nausea Only  . Celecoxib Itching  and Rash    Current Outpatient Prescriptions on File Prior to Visit  Medication Sig Dispense Refill  . bisoprolol-hydrochlorothiazide (ZIAC) 5-6.25 MG per tablet Take 1 tablet by mouth daily. 90 tablet 3  . dexlansoprazole (DEXILANT) 60 MG capsule Take 1 capsule (60 mg total) by mouth daily. 90 capsule 3  . estradiol (ESTRACE) 1 MG tablet TAKE 1 TABLET (1 MG TOTAL) BY MOUTH DAILY. 90 tablet 0  . fluticasone (FLONASE) 50 MCG/ACT nasal spray Place 2 sprays into the nose daily.    Marland Kitchen ipratropium (ATROVENT) 0.03 % nasal spray Place 2 sprays into both nostrils every 12 (twelve) hours. 30 mL 12  . LORazepam (ATIVAN) 1 MG tablet TAKE 1/2 TABLET BY MOUTH IN AM, THEN 1 TABLET AT BEDTIME 135 tablet 0  . magnesium oxide (MAG-OX) 400 MG tablet Take 400 mg by mouth daily.    . meclizine (ANTIVERT) 25 MG tablet Take 1 tablet (25 mg total) by mouth every 6 (six) hours as needed for dizziness. 60 tablet 2  . meloxicam (MOBIC) 15 MG tablet Take 1 tablet (15 mg total) by mouth daily. 90 tablet 3  . Multiple Vitamins-Minerals (ICAPS LUTEIN & ZEAXANTHIN PO) Take 1 tablet by mouth daily.    . traMADol (ULTRAM) 50 MG tablet  Take 1 tablet (50 mg total) by mouth every 6 (six) hours as needed. (Patient not taking: Reported on 06/12/2014) 60 tablet 2   Current Facility-Administered Medications on File Prior to Visit  Medication Dose Route Frequency Provider Last Rate Last Dose  . methylPREDNISolone acetate (DEPO-MEDROL) injection 80 mg  80 mg Intramuscular Q1400 Ricard Dillon, MD        BP 122/82 mmHg  Pulse 60  Temp(Src) 98.6 F (37 C) (Oral)  Ht 5\' 4"  (1.626 m)  Wt 137 lb (62.143 kg)  BMI 23.50 kg/m2  SpO2 98%      Review of Systems  Constitutional: Negative.   HENT: Negative for congestion, dental problem, hearing loss, rhinorrhea, sinus pressure, sore throat and tinnitus.   Eyes: Negative for pain, discharge and visual disturbance.  Respiratory: Negative for cough and shortness of breath.     Cardiovascular: Negative for chest pain, palpitations and leg swelling.  Gastrointestinal: Negative for nausea, vomiting, abdominal pain, diarrhea, constipation, blood in stool and abdominal distention.  Genitourinary: Negative for dysuria, urgency, frequency, hematuria, flank pain, vaginal bleeding, vaginal discharge, difficulty urinating, vaginal pain and pelvic pain.  Musculoskeletal: Positive for back pain and arthralgias. Negative for joint swelling and gait problem.  Skin: Negative for rash.  Neurological: Negative for dizziness, syncope, speech difficulty, weakness, numbness and headaches.  Hematological: Negative for adenopathy.  Psychiatric/Behavioral: Negative for behavioral problems, dysphoric mood and agitation. The patient is not nervous/anxious.        Objective:   Physical Exam  Constitutional: She is oriented to person, place, and time. She appears well-developed and well-nourished.  HENT:  Head: Normocephalic.  Right Ear: External ear normal.  Left Ear: External ear normal.  Mouth/Throat: Oropharynx is clear and moist.  Eyes: Conjunctivae and EOM are normal. Pupils are equal, round, and reactive to light.  Neck: Normal range of motion. Neck supple. No thyromegaly present.  Cardiovascular: Normal rate, regular rhythm and intact distal pulses.   Murmur heard. Grade 1/6 systolic murmur at the base  Pulmonary/Chest: Effort normal and breath sounds normal.  Abdominal: Soft. Bowel sounds are normal. She exhibits no mass. There is no tenderness.  Musculoskeletal: Normal range of motion.  Osteoarthritic changes of the small joints of the hands  Lymphadenopathy:    She has no cervical adenopathy.  Neurological: She is alert and oriented to person, place, and time.  Skin: Skin is warm and dry. No rash noted.  Psychiatric: She has a normal mood and affect. Her behavior is normal.          Assessment & Plan:   Hypertension, well-controlled History of atrial  fibrillation.  Remains normal sinus rhythm History of H. pylori positive peptic ulcer disease Intermittent constipation Osteoarthritis History of iron deficiency anemia History of osteoporosis.  Treated with HRT.  Will discuss biphosphonate therapy   Medications updated Vitamin D supplement.  Encouraged

## 2014-06-12 NOTE — Patient Instructions (Signed)
Limit your sodium (Salt) intake    It is important that you exercise regularly, at least 20 minutes 3 to 4 times per week.  If you develop chest pain or shortness of breath seek  medical attention.  Take a calcium supplement, plus 800-1200 units of vitamin D  Return in one year for follow-up  

## 2014-06-25 ENCOUNTER — Telehealth: Payer: Self-pay | Admitting: Internal Medicine

## 2014-06-25 MED ORDER — DEXLANSOPRAZOLE 60 MG PO CPDR: 60.0000 mg | DELAYED_RELEASE_CAPSULE | Freq: Every day | ORAL | Status: DC

## 2014-06-25 NOTE — Telephone Encounter (Signed)
rx sent in electronically 

## 2014-06-25 NOTE — Telephone Encounter (Signed)
PT NEED refill on dexilant 60 mg #90 w.refills to [facility]

## 2014-07-03 ENCOUNTER — Telehealth: Payer: Self-pay | Admitting: Internal Medicine

## 2014-07-03 MED ORDER — DEXLANSOPRAZOLE 60 MG PO CPDR: 60.0000 mg | DELAYED_RELEASE_CAPSULE | Freq: Every day | ORAL | Status: DC

## 2014-07-03 NOTE — Telephone Encounter (Signed)
Pt notified Rx sent to pharmacy as requested. 

## 2014-07-03 NOTE — Telephone Encounter (Signed)
Pt needs new  rx dexilant 60 mg #8 [facility]

## 2014-07-06 ENCOUNTER — Emergency Department (HOSPITAL_COMMUNITY): Payer: Commercial Managed Care - HMO

## 2014-07-06 ENCOUNTER — Telehealth: Payer: Self-pay

## 2014-07-06 ENCOUNTER — Inpatient Hospital Stay (HOSPITAL_COMMUNITY)
Admission: EM | Admit: 2014-07-06 | Discharge: 2014-07-10 | DRG: 482 | Disposition: A | Discharge status: Skilled Nursing Facility | Payer: Commercial Managed Care - HMO | Attending: Internal Medicine | Admitting: Internal Medicine

## 2014-07-06 ENCOUNTER — Encounter (HOSPITAL_COMMUNITY): Payer: Self-pay | Admitting: Emergency Medicine

## 2014-07-06 DIAGNOSIS — F419 Anxiety disorder, unspecified: Secondary | ICD-10-CM | POA: Diagnosis present

## 2014-07-06 DIAGNOSIS — M25552 Pain in left hip: Secondary | ICD-10-CM | POA: Diagnosis present

## 2014-07-06 DIAGNOSIS — D649 Anemia, unspecified: Secondary | ICD-10-CM | POA: Diagnosis present

## 2014-07-06 DIAGNOSIS — W010XXA Fall on same level from slipping, tripping and stumbling without subsequent striking against object, initial encounter: Secondary | ICD-10-CM | POA: Diagnosis present

## 2014-07-06 DIAGNOSIS — I482 Chronic atrial fibrillation: Secondary | ICD-10-CM

## 2014-07-06 DIAGNOSIS — I10 Essential (primary) hypertension: Secondary | ICD-10-CM | POA: Diagnosis present

## 2014-07-06 DIAGNOSIS — K219 Gastro-esophageal reflux disease without esophagitis: Secondary | ICD-10-CM | POA: Diagnosis present

## 2014-07-06 DIAGNOSIS — Z7982 Long term (current) use of aspirin: Secondary | ICD-10-CM | POA: Diagnosis not present

## 2014-07-06 DIAGNOSIS — S72002A Fracture of unspecified part of neck of left femur, initial encounter for closed fracture: Secondary | ICD-10-CM | POA: Diagnosis present

## 2014-07-06 DIAGNOSIS — M810 Age-related osteoporosis without current pathological fracture: Secondary | ICD-10-CM | POA: Diagnosis present

## 2014-07-06 DIAGNOSIS — M25559 Pain in unspecified hip: Secondary | ICD-10-CM

## 2014-07-06 DIAGNOSIS — Z66 Do not resuscitate: Secondary | ICD-10-CM | POA: Diagnosis present

## 2014-07-06 DIAGNOSIS — Z8781 Personal history of (healed) traumatic fracture: Secondary | ICD-10-CM | POA: Diagnosis present

## 2014-07-06 DIAGNOSIS — Z8673 Personal history of transient ischemic attack (TIA), and cerebral infarction without residual deficits: Secondary | ICD-10-CM

## 2014-07-06 DIAGNOSIS — I4891 Unspecified atrial fibrillation: Secondary | ICD-10-CM | POA: Diagnosis present

## 2014-07-06 DIAGNOSIS — S72009A Fracture of unspecified part of neck of unspecified femur, initial encounter for closed fracture: Secondary | ICD-10-CM | POA: Diagnosis present

## 2014-07-06 HISTORY — DX: Anxiety disorder, unspecified: F41.9

## 2014-07-06 HISTORY — DX: Essential (primary) hypertension: I10

## 2014-07-06 HISTORY — DX: Bifascicular block: I45.2

## 2014-07-06 LAB — BASIC METABOLIC PANEL
Anion gap: 13 (ref 5–15)
BUN: 16 mg/dL (ref 6–23)
CO2: 23 meq/L (ref 19–32)
CREATININE: 0.66 mg/dL (ref 0.50–1.10)
Calcium: 9.3 mg/dL (ref 8.4–10.5)
Chloride: 102 mEq/L (ref 96–112)
GFR calc Af Amer: >90 mL/min (ref >90)
GFR calc non Af Amer: 79 mL/min — ABNORMAL LOW (ref >90)
GLUCOSE: 87 mg/dL (ref 70–99)
POTASSIUM: 4.1 meq/L (ref 3.7–5.3)
Sodium: 138 mEq/L (ref 137–147)

## 2014-07-06 LAB — CBC WITH DIFFERENTIAL/PLATELET
Basophils Absolute: 0 10*3/uL (ref 0.0–0.1)
Basophils Relative: 0 % (ref 0–1)
Eosinophils Absolute: 0.1 10*3/uL (ref 0.0–0.7)
Eosinophils Relative: 1 % (ref 0–5)
HCT: 40.9 % (ref 36.0–46.0)
HEMOGLOBIN: 13 g/dL (ref 12.0–15.0)
LYMPHS ABS: 0.7 10*3/uL (ref 0.7–4.0)
Lymphocytes Relative: 7 % — ABNORMAL LOW (ref 12–46)
MCH: 30.4 pg (ref 26.0–34.0)
MCHC: 31.8 g/dL (ref 30.0–36.0)
MCV: 95.6 fL (ref 78.0–100.0)
MONOS PCT: 6 % (ref 3–12)
Monocytes Absolute: 0.6 10*3/uL (ref 0.1–1.0)
NEUTROS PCT: 86 % — AB (ref 43–77)
Neutro Abs: 7.8 10*3/uL — ABNORMAL HIGH (ref 1.7–7.7)
Platelets: 171 10*3/uL (ref 150–400)
RBC: 4.28 MIL/uL (ref 3.87–5.11)
RDW: 13.6 % (ref 11.5–15.5)
WBC: 9.2 10*3/uL (ref 4.0–10.5)

## 2014-07-06 LAB — TYPE AND SCREEN
ABO/RH(D): A POS
ANTIBODY SCREEN: NEGATIVE

## 2014-07-06 MED ORDER — MORPHINE SULFATE 4 MG/ML IJ SOLN
4.0000 mg | INTRAMUSCULAR | Status: DC | PRN
  Administered 2014-07-06: 4 mg via INTRAVENOUS
  Filled 2014-07-06: qty 1

## 2014-07-06 MED ORDER — MORPHINE SULFATE 2 MG/ML IJ SOLN
0.5000 mg | INTRAMUSCULAR | Status: DC | PRN
  Filled 2014-07-06: qty 1

## 2014-07-06 MED ORDER — MORPHINE SULFATE 4 MG/ML IJ SOLN
4.0000 mg | Freq: Once | INTRAMUSCULAR | Status: AC
  Administered 2014-07-06: 4 mg via INTRAVENOUS
  Filled 2014-07-06: qty 1

## 2014-07-06 MED ORDER — ONDANSETRON HCL 4 MG/2ML IJ SOLN
4.0000 mg | Freq: Once | INTRAMUSCULAR | Status: AC
  Administered 2014-07-06: 4 mg via INTRAVENOUS
  Filled 2014-07-06: qty 2

## 2014-07-06 MED ORDER — SODIUM CHLORIDE 0.45 % IV SOLN
INTRAVENOUS | Status: DC
  Administered 2014-07-06 – 2014-07-07 (×2): via INTRAVENOUS

## 2014-07-06 MED ORDER — ONDANSETRON HCL 4 MG/2ML IJ SOLN: 4.0000 mg | Freq: Three times a day (TID) | INTRAMUSCULAR | Status: DC | PRN

## 2014-07-06 MED ORDER — HYDROCODONE-ACETAMINOPHEN 5-325 MG PO TABS
1.0000 | ORAL_TABLET | Freq: Four times a day (QID) | ORAL | Status: DC | PRN
  Filled 2014-07-06 (×2): qty 1
  Filled 2014-07-06: qty 2
  Filled 2014-07-06 (×4): qty 1

## 2014-07-06 MED ORDER — PANTOPRAZOLE SODIUM 40 MG PO TBEC
80.0000 mg | DELAYED_RELEASE_TABLET | Freq: Every day | ORAL | Status: DC
  Administered 2014-07-07 – 2014-07-10 (×4): 80 mg via ORAL
  Filled 2014-07-06 (×4): qty 2

## 2014-07-06 MED ORDER — DOCUSATE SODIUM 100 MG PO CAPS: 100.0000 mg | ORAL_CAPSULE | Freq: Two times a day (BID) | ORAL | Status: DC

## 2014-07-06 NOTE — ED Provider Notes (Signed)
CSN: [id number]     Arrival date & time 07/06/14  1219 History   First MD Initiated Contact with Patient 07/06/14 1312     Chief Complaint  Patient presents with  . Fall    Patient is a 80 y.o. female presenting with fall. The history is provided by the patient.  Fall This is a new problem. The current episode started 1 to 2 hours ago. The problem has not changed since onset.Pertinent negatives include no chest pain, no abdominal pain, no headaches and no shortness of breath. The symptoms are aggravated by walking. Nothing relieves the symptoms. She has tried rest for the symptoms. The treatment provided no relief.   Patient presents s/p mechanical fall She reports she fell from standing position.  She reports she tripped and fell No head injury.  No LOC No neck pain She has mild back pain She reports left hip pain She reports her pain is worsening   Past Medical History  Diagnosis Date  . GERD (gastroesophageal reflux disease)   . Depression   . Osteoporosis   . Atrial fibrillation     catheter ablation of SVT in 2002  . Osteoarthritis   . CVA (cerebrovascular accident) 2003    left brain  . Iron deficiency anemia   . Right bbb/left ant fasc block    Past Surgical History  Procedure Laterality Date  . Cholecystectomy    . Abdominal hysterectomy    . Total abdominal hysterectomy    . Right eye surgery  2008    dr Zadie Rhine.  Vitrectomy and removal of tissue.   . Colonoscopy    . Laparoscopic ovarian cystectomy     No family history on file. History  Substance Use Topics  . Smoking status: Never Smoker   . Smokeless tobacco: Never Used  . Alcohol Use: No   OB History    No data available     Review of Systems  Constitutional: Negative for fever.  Respiratory: Negative for shortness of breath.   Cardiovascular: Negative for chest pain.  Gastrointestinal: Negative for abdominal pain.  Neurological: Negative for weakness and headaches.  All other systems reviewed  and are negative.     Allergies  Propoxyphene n-acetaminophen; Celecoxib; Fexofenadine; and Penicillins  Home Medications   Prior to Admission medications   Medication Sig Start Date End Date Taking? Authorizing Provider  acetaminophen (TYLENOL ARTHRITIS PAIN) 650 MG CR tablet Take 650 mg by mouth every 12 (twelve) hours as needed for pain.   Yes Historical Provider, MD  bisoprolol-hydrochlorothiazide (ZIAC) 5-6.25 MG per tablet Take 1 tablet by mouth daily. Patient taking differently: Take 1 tablet by mouth daily with breakfast.  05/19/13  Yes Ricard Dillon, MD  cholecalciferol (VITAMIN D) 1000 UNITS tablet Take 1,000 Units by mouth daily with breakfast.   Yes Historical Provider, MD  dexlansoprazole (DEXILANT) 60 MG capsule Take 1 capsule (60 mg total) by mouth daily. Patient taking differently: Take 60 mg by mouth daily with breakfast.  07/03/14  Yes Marletta Lor, MD  estradiol (ESTRACE) 1 MG tablet TAKE 1 TABLET (1 MG TOTAL) BY MOUTH DAILY. Patient taking differently: Take 1 mg by mouth at bedtime.  05/08/14  Yes Ricard Dillon, MD  LORazepam (ATIVAN) 1 MG tablet TAKE 1/2 TABLET BY MOUTH IN AM, THEN 1 TABLET AT BEDTIME Patient taking differently: Take 1 mg by mouth at bedtime.  05/08/14  Yes Ricard Dillon, MD  LUTEIN-ZEAXANTHIN PO Take 1 tablet by mouth daily with  breakfast.   Yes Historical Provider, MD  meloxicam (MOBIC) 15 MG tablet Take 1 tablet (15 mg total) by mouth daily. Patient taking differently: Take 15 mg by mouth daily with breakfast.  05/19/13  Yes Ricard Dillon, MD  Multiple Vitamins-Minerals (HAIR/SKIN/NAILS PO) Take 1 capsule by mouth daily with breakfast.   Yes Historical Provider, MD  Multiple Vitamins-Minerals (ICAPS) CAPS Take 1 capsule by mouth daily with breakfast.   Yes Historical Provider, MD  Omega 3 1000 MG CAPS Take 1 capsule by mouth daily with breakfast.   Yes Historical Provider, MD  PRESCRIPTION MEDICATION Got injection in left eye at eye dr's  office   Yes Historical Provider, MD  ipratropium (ATROVENT) 0.03 % nasal spray Place 2 sprays into both nostrils every 12 (twelve) hours. Patient not taking: Reported on 07/06/2014 08/11/13   Ricard Dillon, MD  meclizine (ANTIVERT) 25 MG tablet Take 1 tablet (25 mg total) by mouth every 6 (six) hours as needed for dizziness. Patient not taking: Reported on 07/06/2014 02/14/13   Laurey Morale, MD  traMADol (ULTRAM) 50 MG tablet Take 1 tablet (50 mg total) by mouth every 6 (six) hours as needed. Patient not taking: Reported on 07/06/2014 02/09/14   Ricard Dillon, MD   BP 167/69 mmHg  Pulse 62  Temp(Src) 97.5 F (36.4 C) (Oral)  Resp 14  SpO2 94% Physical Exam CONSTITUTIONAL: Well developed/well nourished HEAD: Normocephalic/atraumatic EYES: EOMI/PERRL ENMT: Mucous membranes moist NECK: supple no meningeal signs SPINE/BACK:entire spine nontender, mild lumbar paraspinal tenderness, No bruising/crepitance/stepoffs noted to spine CV: S1/S2 noted, no murmurs/rubs/gallops noted LUNGS: Lungs are clear to auscultation bilaterally, no apparent distress ABDOMEN: soft, nontender, no rebound or guarding, bowel sounds noted throughout abdomen GU:no cva tenderness NEURO: Pt is awake/alert/appropriate, moves all extremitiesx4.  No facial droop.   EXTREMITIES: pulses normal/equal, full ROM.  Tenderness with ROM of left LE.  Distal pulses intact in lower extremities SKIN: warm, color normal PSYCH: no abnormalities of mood noted, alert and oriented to situation  ED Course  Procedures   3:26 PM Pt noted to have left hip fx s/p fall D/w dr Rolena Infante with [facility] Will see patient in hospital, admit to medicine He is not sure when surgery will occur Last meal - 9am for patient  3:35 PM D/w dr Gevena Barre Will admit to medical service  Labs Review Labs Reviewed  BASIC METABOLIC PANEL - Abnormal; Notable for the following:    GFR calc non Af Amer 79 (*)    All other components within normal  limits  CBC WITH DIFFERENTIAL - Abnormal; Notable for the following:    Neutrophils Relative % 86 (*)    Neutro Abs 7.8 (*)    Lymphocytes Relative 7 (*)    All other components within normal limits  TYPE AND SCREEN    Imaging Review Dg Lumbar Spine Complete  07/06/2014   CLINICAL DATA:  Fall, back pain, left hip pain  EXAM: LUMBAR SPINE - COMPLETE 4+ VIEW  COMPARISON:  05/02/2012  FINDINGS: Four views of lumbar spine submitted. There is diffuse osteopenia. There is disc space flattening with vacuum disc phenomenon at L4-L5 level. Stable mild compression deformity upper endplate of L1 vertebral body of indeterminate age.  IMPRESSION: No acute fracture or subluxation. Disc space flattening at L4-L5 level. Stable mild compression deformity upper endplate of L1 vertebral body of indeterminate age.   Electronically Signed   By: Lahoma Crocker M.D.   On: 07/06/2014 14:31   Dg  Hip Complete Left  07/06/2014   CLINICAL DATA:  Fall, left hip pain  EXAM: LEFT HIP - COMPLETE 2+ VIEW  COMPARISON:  None.  FINDINGS: Three views of the left hip submitted. There is mild displaced fracture of left femoral neck. Degenerative changes right hip joint. Diffuse osteopenia.  IMPRESSION: Mild displaced fracture of left femoral neck.  Diffuse osteopenia.   Electronically Signed   By: Lahoma Crocker M.D.   On: 07/06/2014 14:25     EKG Interpretation   Date/Time:  Friday July 06 2014 14:54:37 EST Ventricular Rate:  64 PR Interval:    QRS Duration: 137 QT Interval:  465 QTC Calculation: 480 R Axis:   84 Text Interpretation:  indeterminate rhythm Right bundle branch block  Non-specific ST-t changes Confirmed by Christy Gentles  MD, Elenore Rota ([id number]) on  07/06/2014 3:12:24 PM     Medications  morphine 4 MG/ML injection 4 mg (4 mg Intravenous Given 07/06/14 1427)  ondansetron (ZOFRAN) injection 4 mg (4 mg Intravenous Given 07/06/14 1427)     MDM   Final diagnoses:  Hip pain  Left displaced femoral neck fracture,  closed, initial encounter    Nursing notes including past medical history and social history reviewed and considered in documentation xrays/imaging reviewed by myself and considered during evaluation Labs/vital reviewed myself and considered during evaluation     Sharyon Cable, MD 07/06/14 1535

## 2014-07-06 NOTE — Telephone Encounter (Signed)
Rx request for Dexilant 60 mg capsule  Pharm:  [facility]

## 2014-07-06 NOTE — ED Notes (Addendum)
Per EMS, no left hip rotation or shortening-7/10 pain-positive pulses, able to wiggle toes-CBG 132-pelvic wrap in place, pulses marked

## 2014-07-06 NOTE — ED Notes (Signed)
Bed: WA13 Expected date:  Expected time:  Means of arrival:  Comments: EMS-fall 

## 2014-07-06 NOTE — ED Notes (Signed)
Lab draw attempted x1 unsuccessful

## 2014-07-06 NOTE — ED Notes (Signed)
Per EMS-patient fell this am at standing position-tripped while getting ready to take shower-no LOC-fell onto left hip, increased pain

## 2014-07-06 NOTE — H&P (Signed)
History and Physical  Megan Olsen DOB: [birth year] DOA: 07/06/2014  Referring physician: Julious Oka, ER physician PCP: Nyoka Cowden, MD   Chief Complaint: Fall with left-sided pain  HPI: Megan Olsen is a 80 y.o. female  With history of atrial fibrillation status post catheter ablation, osteoporosis, hypertension and CVA who is normally quite functional and today suffered a mechanical fall when she tripped on her carpet landing on her left side. She was unable to stand and she had severe pain in her left hip and she was able to call for a neighbor who summoned paramedics. Patient was brought into the emergency room and x-rays done noted a left displaced femoral neck fracture. Lab work was done which was unrevealing. EKG noted what looked to be atrial fibrillation rate controlled with a right bundle branch block which has been seen previously. Hospitalists were called for further evaluation and admission and orthopedics were notified.   Review of Systems:  Patient seen after arrival to floor. Pt complains of some mild soreness on left side of her hip. Also has some difficulty with very deep inspiration, but otherwise no other complaints..  Pt denies any headaches, vision changes, dysphagia, chest pain, palpitations, wheeze, cough, abdominal pain, hematuria, dysuria, constipation, diarrhea, focal extremity numbness or weakness or pain.  Review of systems are otherwise negative  Past Medical History  Diagnosis Date  . GERD (gastroesophageal reflux disease)   . Depression   . Osteoporosis   . Atrial fibrillation     catheter ablation of SVT in 2002  . Osteoarthritis   . CVA (cerebrovascular accident) 2003    left brain  . Iron deficiency anemia   . Right bbb/left ant fasc block    Past Surgical History  Procedure Laterality Date  . Cholecystectomy    . Abdominal hysterectomy    . Total abdominal hysterectomy    . Right eye surgery  2008    dr  Zadie Rhine.  Vitrectomy and removal of tissue.   . Colonoscopy    . Laparoscopic ovarian cystectomy     Social History:  reports that she has never smoked. She has never used smokeless tobacco. She reports that she does not drink alcohol or use illicit drugs. Patient lives at home by herself & is able to participate in activities of daily living with out any assistance. Patient is quite active  Allergies  Allergen Reactions  . Propoxyphene N-Acetaminophen Nausea Only  . Celecoxib Itching and Rash  . Fexofenadine Rash  . Penicillins Hives and Rash    Family history: None, as confirmed by patient  Prior to Admission medications   Medication Sig Start Date End Date Taking? Authorizing Provider  acetaminophen (TYLENOL ARTHRITIS PAIN) 650 MG CR tablet Take 650 mg by mouth every 12 (twelve) hours as needed for pain.   Yes Historical Provider, MD  bisoprolol-hydrochlorothiazide (ZIAC) 5-6.25 MG per tablet Take 1 tablet by mouth daily. Patient taking differently: Take 1 tablet by mouth daily with breakfast.  05/19/13  Yes Ricard Dillon, MD  cholecalciferol (VITAMIN D) 1000 UNITS tablet Take 1,000 Units by mouth daily with breakfast.   Yes Historical Provider, MD  dexlansoprazole (DEXILANT) 60 MG capsule Take 1 capsule (60 mg total) by mouth daily. Patient taking differently: Take 60 mg by mouth daily with breakfast.  07/03/14  Yes Marletta Lor, MD  estradiol (ESTRACE) 1 MG tablet TAKE 1 TABLET (1 MG TOTAL) BY MOUTH DAILY. Patient taking differently: Take 1 mg by mouth at bedtime.  05/08/14  Yes Ricard Dillon, MD  LORazepam (ATIVAN) 1 MG tablet TAKE 1/2 TABLET BY MOUTH IN AM, THEN 1 TABLET AT BEDTIME Patient taking differently: Take 1 mg by mouth at bedtime.  05/08/14  Yes Ricard Dillon, MD  LUTEIN-ZEAXANTHIN PO Take 1 tablet by mouth daily with breakfast.   Yes Historical Provider, MD  meloxicam (MOBIC) 15 MG tablet Take 1 tablet (15 mg total) by mouth daily. Patient taking differently:  Take 15 mg by mouth daily with breakfast.  05/19/13  Yes Ricard Dillon, MD  Multiple Vitamins-Minerals (HAIR/SKIN/NAILS PO) Take 1 capsule by mouth daily with breakfast.   Yes Historical Provider, MD  Multiple Vitamins-Minerals (ICAPS) CAPS Take 1 capsule by mouth daily with breakfast.   Yes Historical Provider, MD  Omega 3 1000 MG CAPS Take 1 capsule by mouth daily with breakfast.   Yes Historical Provider, MD  PRESCRIPTION MEDICATION Got injection in left eye at eye dr's office   Yes Historical Provider, MD  ipratropium (ATROVENT) 0.03 % nasal spray Place 2 sprays into both nostrils every 12 (twelve) hours. Patient not taking: Reported on 07/06/2014 08/11/13   Ricard Dillon, MD  meclizine (ANTIVERT) 25 MG tablet Take 1 tablet (25 mg total) by mouth every 6 (six) hours as needed for dizziness. Patient not taking: Reported on 07/06/2014 02/14/13   Laurey Morale, MD  traMADol (ULTRAM) 50 MG tablet Take 1 tablet (50 mg total) by mouth every 6 (six) hours as needed. Patient not taking: Reported on 07/06/2014 02/09/14   Ricard Dillon, MD    Physical Exam: BP 157/57 mmHg  Pulse 62  Temp(Src) 98.3 F (36.8 C) (Oral)  Resp 14  SpO2 96%  General:  Alert and oriented 3, no acute distress Eyes: Sclera nonicteric, extraocular movements are intact ENT: Normocephalic, atraumatic, no extremities are slightly dry Neck: Supple, no JVD Cardiovascular: Irregular rhythm, rate controlled Respiratory: Clear to auscultation bilaterally, limited inspiratory effort Abdomen: Soft, nontender, nondistended, hypoactive bowel sounds Skin: No skin breaks, tears or lesions Musculoskeletal: no clubbing or cyanosis or edema. Further musculoskeletal examination deferred secondary to injury Psychiatric:  Patient is appropriate, no evidence of psychoses Neurologic: no focal deficits           Labs on Admission:  Basic Metabolic Panel:  Recent Labs Lab 07/06/14 1430  NA 138  K 4.1  CL 102  CO2 23  GLUCOSE  87  BUN 16  CREATININE 0.66  CALCIUM 9.3   Liver Function Tests: No results for input(s): AST, ALT, ALKPHOS, BILITOT, PROT, ALBUMIN in the last 168 hours. No results for input(s): LIPASE, AMYLASE in the last 168 hours. No results for input(s): AMMONIA in the last 168 hours. CBC:  Recent Labs Lab 07/06/14 1430  WBC 9.2  NEUTROABS 7.8*  HGB 13.0  HCT 40.9  MCV 95.6  PLT 171   Cardiac Enzymes: No results for input(s): CKTOTAL, CKMB, CKMBINDEX, TROPONINI in the last 168 hours.  BNP (last 3 results) No results for input(s): PROBNP in the last 8760 hours. CBG: No results for input(s): GLUCAP in the last 168 hours.  Radiological Exams on Admission: Dg Lumbar Spine Complete  07/06/2014   CLINICAL DATA:  Fall, back pain, left hip pain  EXAM: LUMBAR SPINE - COMPLETE 4+ VIEW  COMPARISON:  05/02/2012  FINDINGS: Four views of lumbar spine submitted. There is diffuse osteopenia. There is disc space flattening with vacuum disc phenomenon at L4-L5 level. Stable mild compression deformity upper endplate of L1 vertebral  body of indeterminate age.  IMPRESSION: No acute fracture or subluxation. Disc space flattening at L4-L5 level. Stable mild compression deformity upper endplate of L1 vertebral body of indeterminate age.   Electronically Signed   By: Lahoma Crocker M.D.   On: 07/06/2014 14:31   Dg Hip Complete Left  07/06/2014   CLINICAL DATA:  Fall, left hip pain  EXAM: LEFT HIP - COMPLETE 2+ VIEW  COMPARISON:  None.  FINDINGS: Three views of the left hip submitted. There is mild displaced fracture of left femoral neck. Degenerative changes right hip joint. Diffuse osteopenia.  IMPRESSION: Mild displaced fracture of left femoral neck.  Diffuse osteopenia.   Electronically Signed   By: Lahoma Crocker M.D.   On: 07/06/2014 14:25    EKG: Independently reviewed.  Rate controlled, right bundle branch block, atrial fibrillation  Assessment/Plan Present on Admission:  . A-fib status post catheter ablation.  Patient still has irregular rhythm at times has a history of some mild bradycardia. Otherwise, low chad2 score and she is not on chronic anticoagulation for this  . Hip fracture, left: Patient is quite functional normally without any chest pain or shortness of breath. I would consider her medically cleared for surgery  . Benign essential HTN: Stable, holding antihypertensives until after surgery  . GERD (gastroesophageal reflux disease): Continue PPI  . Anxiety disorder: Holding benzodiazepines until after surgery given concerns for anesthesia and pain medication may make her mildly delirious  . history of CVA: Patient has no residual deficits. She is noted to have some cortical infarcts incidentally noted on an MRI of the brain back in 2003. She actually should be on a daily aspirin 81 mg long-term. Following her hip surgery and DVT prophylaxis postop, she could be started on daily aspirin. We'll make a note of this for her primary care physician   Consultants: Alvan Dame, orthopedic surgery  Code Status:  DO NOT RESUSCITATE as confirmed by patient  Family Communication:  Patient does not have any family   Disposition Plan: likely here for several days after surgery and will need short-term skilled nursing early next week  Time spent:  35 minutes  [facility] Pager [phone]

## 2014-07-06 NOTE — Telephone Encounter (Signed)
Rx already sent on 12/7.

## 2014-07-07 ENCOUNTER — Inpatient Hospital Stay (HOSPITAL_COMMUNITY): Payer: Commercial Managed Care - HMO

## 2014-07-07 ENCOUNTER — Encounter (HOSPITAL_COMMUNITY)
Admission: EM | Disposition: A | Discharge status: Skilled Nursing Facility | Payer: Self-pay | Source: Home / Self Care | Attending: Internal Medicine

## 2014-07-07 ENCOUNTER — Inpatient Hospital Stay (HOSPITAL_COMMUNITY): Payer: Commercial Managed Care - HMO | Admitting: Anesthesiology

## 2014-07-07 DIAGNOSIS — Z8673 Personal history of transient ischemic attack (TIA), and cerebral infarction without residual deficits: Secondary | ICD-10-CM

## 2014-07-07 DIAGNOSIS — K219 Gastro-esophageal reflux disease without esophagitis: Secondary | ICD-10-CM

## 2014-07-07 HISTORY — PX: HIP PINNING,CANNULATED: SHX1758

## 2014-07-07 LAB — SURGICAL PCR SCREEN
MRSA, PCR: NEGATIVE
Staphylococcus aureus: NEGATIVE

## 2014-07-07 SURGERY — FIXATION, FEMUR, NECK, PERCUTANEOUS, USING SCREW
Anesthesia: General | Laterality: Left

## 2014-07-07 MED ORDER — ALUM & MAG HYDROXIDE-SIMETH 200-200-20 MG/5ML PO SUSP: 30.0000 mL | ORAL | Status: DC | PRN

## 2014-07-07 MED ORDER — MENTHOL 3 MG MT LOZG
1.0000 | LOZENGE | OROMUCOSAL | Status: DC | PRN
  Filled 2014-07-07: qty 9

## 2014-07-07 MED ORDER — EPHEDRINE SULFATE 50 MG/ML IJ SOLN
INTRAMUSCULAR | Status: DC | PRN
  Administered 2014-07-07: 10 mg via INTRAVENOUS
  Administered 2014-07-07 (×2): 5 mg via INTRAVENOUS

## 2014-07-07 MED ORDER — HYDROCODONE-ACETAMINOPHEN 5-325 MG PO TABS
1.0000 | ORAL_TABLET | Freq: Four times a day (QID) | ORAL | Status: DC | PRN
  Administered 2014-07-08: 1 via ORAL
  Administered 2014-07-08: 2 via ORAL
  Administered 2014-07-08 – 2014-07-10 (×6): 1 via ORAL
  Filled 2014-07-07 (×2): qty 1

## 2014-07-07 MED ORDER — POLYETHYLENE GLYCOL 3350 17 G PO PACK: 17.0000 g | PACK | Freq: Every day | ORAL | Status: DC | PRN

## 2014-07-07 MED ORDER — ACETAMINOPHEN 325 MG PO TABS: 650.0000 mg | ORAL_TABLET | Freq: Four times a day (QID) | ORAL | Status: DC | PRN

## 2014-07-07 MED ORDER — CLINDAMYCIN PHOSPHATE 900 MG/50ML IV SOLN
INTRAVENOUS | Status: AC
  Filled 2014-07-07: qty 50

## 2014-07-07 MED ORDER — METOCLOPRAMIDE HCL 5 MG/ML IJ SOLN: 5.0000 mg | Freq: Three times a day (TID) | INTRAMUSCULAR | Status: DC | PRN

## 2014-07-07 MED ORDER — MORPHINE SULFATE 2 MG/ML IJ SOLN
0.5000 mg | INTRAMUSCULAR | Status: DC | PRN
  Administered 2014-07-09: 0.5 mg via INTRAVENOUS

## 2014-07-07 MED ORDER — METHOCARBAMOL 500 MG PO TABS
500.0000 mg | ORAL_TABLET | Freq: Three times a day (TID) | ORAL | Status: DC | PRN
  Administered 2014-07-08: 500 mg via ORAL
  Filled 2014-07-07 (×2): qty 1

## 2014-07-07 MED ORDER — OMEGA-3-ACID ETHYL ESTERS 1 G PO CAPS
3.0000 g | ORAL_CAPSULE | Freq: Every day | ORAL | Status: DC
  Administered 2014-07-08 – 2014-07-10 (×3): 3 g via ORAL
  Filled 2014-07-07 (×3): qty 3

## 2014-07-07 MED ORDER — DOCUSATE SODIUM 100 MG PO CAPS
100.0000 mg | ORAL_CAPSULE | Freq: Two times a day (BID) | ORAL | Status: DC
  Administered 2014-07-07 – 2014-07-09 (×6): 100 mg via ORAL

## 2014-07-07 MED ORDER — FENTANYL CITRATE 0.05 MG/ML IJ SOLN
INTRAMUSCULAR | Status: AC
  Filled 2014-07-07: qty 2

## 2014-07-07 MED ORDER — BISOPROLOL-HYDROCHLOROTHIAZIDE 5-6.25 MG PO TABS: 1.0000 | ORAL_TABLET | Freq: Every day | ORAL | Status: DC

## 2014-07-07 MED ORDER — PHENYLEPHRINE HCL 10 MG/ML IJ SOLN
INTRAMUSCULAR | Status: DC | PRN
Start: 2014-07-07 — End: 2014-07-07
  Administered 2014-07-07: 80 ug via INTRAVENOUS

## 2014-07-07 MED ORDER — ONDANSETRON HCL 4 MG/2ML IJ SOLN
4.0000 mg | Freq: Four times a day (QID) | INTRAMUSCULAR | Status: DC | PRN
  Administered 2014-07-08 – 2014-07-09 (×2): 4 mg via INTRAVENOUS
  Filled 2014-07-07 (×2): qty 2

## 2014-07-07 MED ORDER — ONDANSETRON HCL 4 MG PO TABS: 4.0000 mg | ORAL_TABLET | Freq: Four times a day (QID) | ORAL | Status: DC | PRN

## 2014-07-07 MED ORDER — METOCLOPRAMIDE HCL 5 MG PO TABS: 5.0000 mg | ORAL_TABLET | Freq: Three times a day (TID) | ORAL | Status: DC | PRN

## 2014-07-07 MED ORDER — METHOCARBAMOL 500 MG PO TABS: 500.0000 mg | ORAL_TABLET | Freq: Once | ORAL | Status: DC

## 2014-07-07 MED ORDER — MORPHINE SULFATE 10 MG/ML IJ SOLN: 0.5000 mg | INTRAMUSCULAR | Status: DC | PRN

## 2014-07-07 MED ORDER — VITAMIN D3 25 MCG (1000 UNIT) PO TABS
1000.0000 [IU] | ORAL_TABLET | Freq: Every day | ORAL | Status: DC
  Administered 2014-07-08 – 2014-07-10 (×3): 1000 [IU] via ORAL
  Filled 2014-07-07 (×3): qty 1

## 2014-07-07 MED ORDER — LORAZEPAM 1 MG PO TABS
1.0000 mg | ORAL_TABLET | Freq: Every day | ORAL | Status: DC
  Administered 2014-07-07 – 2014-07-09 (×3): 1 mg via ORAL
  Filled 2014-07-07 (×3): qty 1

## 2014-07-07 MED ORDER — MELOXICAM 15 MG PO TABS
15.0000 mg | ORAL_TABLET | Freq: Every day | ORAL | Status: DC
  Administered 2014-07-08 – 2014-07-10 (×3): 15 mg via ORAL
  Filled 2014-07-07 (×3): qty 1

## 2014-07-07 MED ORDER — ASPIRIN EC 325 MG PO TBEC
325.0000 mg | DELAYED_RELEASE_TABLET | Freq: Two times a day (BID) | ORAL | Status: DC
  Administered 2014-07-07 – 2014-07-10 (×6): 325 mg via ORAL
  Filled 2014-07-07 (×7): qty 1

## 2014-07-07 MED ORDER — HYDROCODONE-ACETAMINOPHEN 5-325 MG PO TABS: 1.0000 | ORAL_TABLET | Freq: Four times a day (QID) | ORAL | Status: DC | PRN

## 2014-07-07 MED ORDER — BISOPROLOL-HYDROCHLOROTHIAZIDE 5-6.25 MG PO TABS
1.0000 | ORAL_TABLET | Freq: Every day | ORAL | Status: DC
  Administered 2014-07-08 – 2014-07-10 (×3): 1 via ORAL
  Filled 2014-07-07 (×3): qty 1

## 2014-07-07 MED ORDER — ONDANSETRON HCL 4 MG/2ML IJ SOLN: 4.0000 mg | Freq: Once | INTRAMUSCULAR | Status: DC | PRN

## 2014-07-07 MED ORDER — SODIUM CHLORIDE 0.9 % IV SOLN
INTRAVENOUS | Status: DC
  Administered 2014-07-07: 22:00:00 via INTRAVENOUS
  Filled 2014-07-07 (×7): qty 1000

## 2014-07-07 MED ORDER — ONDANSETRON HCL 4 MG/2ML IJ SOLN
INTRAMUSCULAR | Status: DC | PRN
  Administered 2014-07-07: 4 mg via INTRAVENOUS

## 2014-07-07 MED ORDER — ACETAMINOPHEN 650 MG RE SUPP: 650.0000 mg | Freq: Four times a day (QID) | RECTAL | Status: DC | PRN

## 2014-07-07 MED ORDER — FENTANYL CITRATE 0.05 MG/ML IJ SOLN
INTRAMUSCULAR | Status: DC | PRN
  Administered 2014-07-07 (×3): 50 ug via INTRAVENOUS

## 2014-07-07 MED ORDER — CLINDAMYCIN PHOSPHATE 900 MG/50ML IV SOLN
INTRAVENOUS | Status: DC | PRN
  Administered 2014-07-07: 900 mg via INTRAVENOUS

## 2014-07-07 MED ORDER — CLINDAMYCIN PHOSPHATE 600 MG/50ML IV SOLN
600.0000 mg | Freq: Four times a day (QID) | INTRAVENOUS | Status: AC
  Administered 2014-07-07 – 2014-07-08 (×2): 600 mg via INTRAVENOUS
  Filled 2014-07-07 (×2): qty 50

## 2014-07-07 MED ORDER — OMEGA 3 1000 MG PO CAPS: 1.0000 | ORAL_CAPSULE | Freq: Every day | ORAL | Status: DC

## 2014-07-07 MED ORDER — FENTANYL CITRATE 0.05 MG/ML IJ SOLN
25.0000 ug | INTRAMUSCULAR | Status: DC | PRN
  Administered 2014-07-07 (×2): 50 ug via INTRAVENOUS

## 2014-07-07 MED ORDER — HYDRALAZINE HCL 20 MG/ML IJ SOLN
10.0000 mg | Freq: Four times a day (QID) | INTRAMUSCULAR | Status: DC | PRN
  Administered 2014-07-07 (×2): 10 mg via INTRAVENOUS
  Filled 2014-07-07 (×2): qty 1

## 2014-07-07 MED ORDER — LACTATED RINGERS IV SOLN
INTRAVENOUS | Status: DC | PRN
  Administered 2014-07-07: 13:00:00 via INTRAVENOUS

## 2014-07-07 MED ORDER — LIDOCAINE HCL (CARDIAC) 20 MG/ML IV SOLN
INTRAVENOUS | Status: DC | PRN
  Administered 2014-07-07: 100 mg via INTRAVENOUS

## 2014-07-07 MED ORDER — PHENOL 1.4 % MT LIQD
1.0000 | OROMUCOSAL | Status: DC | PRN
  Filled 2014-07-07: qty 177

## 2014-07-07 MED ORDER — ASPIRIN EC 325 MG PO TBEC: 325.0000 mg | DELAYED_RELEASE_TABLET | Freq: Two times a day (BID) | ORAL | Status: DC

## 2014-07-07 MED ORDER — SUCCINYLCHOLINE CHLORIDE 20 MG/ML IJ SOLN
INTRAMUSCULAR | Status: DC | PRN
  Administered 2014-07-07: 100 mg via INTRAVENOUS

## 2014-07-07 MED ORDER — LORAZEPAM 1 MG PO TABS
1.0000 mg | ORAL_TABLET | Freq: Once | ORAL | Status: AC
  Administered 2014-07-07: 1 mg via ORAL
  Filled 2014-07-07: qty 1

## 2014-07-07 MED ORDER — IPRATROPIUM BROMIDE 0.03 % NA SOLN
2.0000 | Freq: Two times a day (BID) | NASAL | Status: DC
  Administered 2014-07-08 – 2014-07-10 (×3): 2 via NASAL
  Filled 2014-07-07: qty 30

## 2014-07-07 MED ORDER — MECLIZINE HCL 25 MG PO TABS
25.0000 mg | ORAL_TABLET | Freq: Four times a day (QID) | ORAL | Status: DC | PRN
  Filled 2014-07-07: qty 1

## 2014-07-07 MED ORDER — PROPOFOL 10 MG/ML IV BOLUS
INTRAVENOUS | Status: DC | PRN
  Administered 2014-07-07: 125 mg via INTRAVENOUS

## 2014-07-07 MED ORDER — FENTANYL CITRATE 0.05 MG/ML IJ SOLN
INTRAMUSCULAR | Status: AC
  Filled 2014-07-07: qty 5

## 2014-07-07 MED ORDER — PROPOFOL 10 MG/ML IV BOLUS
INTRAVENOUS | Status: AC
  Filled 2014-07-07: qty 20

## 2014-07-07 MED ORDER — LORAZEPAM 0.5 MG PO TABS: 0.5000 mg | ORAL_TABLET | Freq: Once | ORAL | Status: DC

## 2014-07-07 MED ORDER — ESTRADIOL 1 MG PO TABS
1.0000 mg | ORAL_TABLET | Freq: Every day | ORAL | Status: DC
  Administered 2014-07-07 – 2014-07-09 (×3): 1 mg via ORAL
  Filled 2014-07-07 (×4): qty 1

## 2014-07-07 SURGICAL SUPPLY — 39 items
BAG ZIPLOCK 12X15 (MISCELLANEOUS) ×3 IMPLANT
BIT DRILL ACE CANN 5.5 (BIT) ×2 IMPLANT
BIT DRILL ACE CANN 5.5MM (BIT) ×1
BNDG COHESIVE 4X5 TAN STRL (GAUZE/BANDAGES/DRESSINGS) IMPLANT
DRAPE SURG 17X11 SM STRL (DRAPES) ×3 IMPLANT
DRSG AQUACEL AG ADV 3.5X14 (GAUZE/BANDAGES/DRESSINGS) ×3 IMPLANT
DRSG EMULSION OIL 3X16 NADH (GAUZE/BANDAGES/DRESSINGS) IMPLANT
DRSG PAD ABDOMINAL 8X10 ST (GAUZE/BANDAGES/DRESSINGS) IMPLANT
DURAPREP 26ML APPLICATOR (WOUND CARE) ×3 IMPLANT
ELECT REM PT RETURN 9FT ADLT (ELECTROSURGICAL)
ELECTRODE REM PT RTRN 9FT ADLT (ELECTROSURGICAL) IMPLANT
GAUZE SPONGE 4X4 12PLY STRL (GAUZE/BANDAGES/DRESSINGS) IMPLANT
GLOVE BIOGEL PI IND STRL 7.5 (GLOVE) ×1 IMPLANT
GLOVE BIOGEL PI IND STRL 8.5 (GLOVE) IMPLANT
GLOVE BIOGEL PI INDICATOR 7.5 (GLOVE) ×2
GLOVE BIOGEL PI INDICATOR 8.5 (GLOVE)
GLOVE ECLIPSE 8.0 STRL XLNG CF (GLOVE) IMPLANT
GLOVE ORTHO TXT STRL SZ7.5 (GLOVE) ×6 IMPLANT
GLOVE SURG ORTHO 8.0 STRL STRW (GLOVE) ×3 IMPLANT
GOWN SPEC L3 XXLG W/TWL (GOWN DISPOSABLE) ×6 IMPLANT
GOWN STRL REUS W/TWL LRG LVL3 (GOWN DISPOSABLE) ×3 IMPLANT
GUIDEWIRE THREADED 2.8 (WIRE) IMPLANT
MANIFOLD NEPTUNE II (INSTRUMENTS) ×3 IMPLANT
NS IRRIG 1000ML POUR BTL (IV SOLUTION) ×3 IMPLANT
PACK GENERAL/GYN (CUSTOM PROCEDURE TRAY) ×3 IMPLANT
PAD CAST 4YDX4 CTTN HI CHSV (CAST SUPPLIES) ×1 IMPLANT
PADDING CAST COTTON 4X4 STRL (CAST SUPPLIES) ×2
PADDING CAST COTTON 6X4 STRL (CAST SUPPLIES) IMPLANT
PIN THREADED GUIDE ACE (PIN) ×3 IMPLANT
POSITIONER SURGICAL ARM (MISCELLANEOUS) ×6 IMPLANT
SCREW CANN 8.0 80MM (Screw) ×6 IMPLANT
SCREW CANN 8.0 85MM (Screw) ×3 IMPLANT
SCREW CANN 8.0 90MM (Screw) ×3 IMPLANT
STAPLER VISISTAT 35W (STAPLE) ×3 IMPLANT
SUT VIC AB 2-0 CT1 27 (SUTURE) ×4
SUT VIC AB 2-0 CT1 TAPERPNT 27 (SUTURE) ×2 IMPLANT
TOWEL OR 17X26 10 PK STRL BLUE (TOWEL DISPOSABLE) ×6 IMPLANT
TRAY FOLEY CATH 14FRSI W/METER (CATHETERS) ×3 IMPLANT
WATER STERILE IRR 1500ML POUR (IV SOLUTION) IMPLANT

## 2014-07-07 NOTE — Anesthesia Postprocedure Evaluation (Signed)
  Anesthesia Post-op Note  Patient: Megan Olsen  Procedure(s) Performed: Procedure(s) (LRB): CANNULATED HIP PINNING (Left)  Patient Location: PACU  Anesthesia Type: General  Level of Consciousness: awake and alert   Airway and Oxygen Therapy: Patient Spontanous Breathing  Post-op Pain: mild  Post-op Assessment: Post-op Vital signs reviewed, Patient's Cardiovascular Status Stable, Respiratory Function Stable, Patent Airway and No signs of Nausea or vomiting  Last Vitals:  Filed Vitals:   07/07/14 1445  BP: 155/67  Pulse:   Temp: 36.2 C  Resp:     Post-op Vital Signs: stable   Complications: No apparent anesthesia complications

## 2014-07-07 NOTE — Transfer of Care (Signed)
Immediate Anesthesia Transfer of Care Note  Patient: Megan Olsen  Procedure(s) Performed: Procedure(s): CANNULATED HIP PINNING (Left)  Patient Location: PACU  Anesthesia Type:General  Level of Consciousness: awake, alert  and confused  Airway & Oxygen Therapy: Patient Spontanous Breathing and Patient connected to nasal cannula oxygen  Post-op Assessment: Report given to PACU RN and Post -op Vital signs reviewed and stable  Post vital signs: Reviewed and stable  Complications: No apparent anesthesia complications

## 2014-07-07 NOTE — OR Nursing (Signed)
Late entry: insertion of foley time

## 2014-07-07 NOTE — Consult Note (Signed)
Reason for Consult: Left femoral neck, hip fracture Referring Physician:  Ree Kida, MD  Megan Olsen is an 80 y.o. female.  HPI:  80 yo female who lives independently unfortunately had a ground level fall at her home landing on her left hip.  She had immediate pain and inability to bear weight.  She did have any other complaints.  Admitted to [facility] service per fracture pathway protocol after ER evaluation revealed femoral neck fracture.  Relatively comfortable in bed when seen yesterday. No other complaints  Past Medical History  Diagnosis Date  . GERD (gastroesophageal reflux disease)   . Depression   . Osteoporosis   . Atrial fibrillation     catheter ablation of SVT in 2002  . Osteoarthritis   . CVA (cerebrovascular accident) 2003    left brain  . Iron deficiency anemia   . Right bbb/left ant fasc block     Past Surgical History  Procedure Laterality Date  . Cholecystectomy    . Abdominal hysterectomy    . Total abdominal hysterectomy    . Right eye surgery  2008    dr Zadie Rhine.  Vitrectomy and removal of tissue.   . Colonoscopy    . Laparoscopic ovarian cystectomy      No family history on file.  Social History:  reports that she has never smoked. She has never used smokeless tobacco. She reports that she does not drink alcohol or use illicit drugs.  Allergies:  Allergies  Allergen Reactions  . Propoxyphene N-Acetaminophen Nausea Only  . Celecoxib Itching and Rash  . Fexofenadine Rash  . Penicillins Hives and Rash    Medications:  I have reviewed the patient's current medications. Scheduled: . docusate sodium  100 mg Oral BID  . pantoprazole  80 mg Oral Daily    Results for orders placed or performed during the hospital encounter of 07/06/14 (from the past 24 hour(s))  Basic metabolic panel     Status: Abnormal   Collection Time: 07/06/14  2:30 PM  Result Value Ref Range   Sodium 138 137 - 147 mEq/L   Potassium 4.1 3.7 - 5.3 mEq/L   Chloride 102  96 - 112 mEq/L   CO2 23 19 - 32 mEq/L   Glucose, Bld 87 70 - 99 mg/dL   BUN 16 6 - 23 mg/dL   Creatinine, Ser 0.66 0.50 - 1.10 mg/dL   Calcium 9.3 8.4 - 10.5 mg/dL   GFR calc non Af Amer 79 (L) >90 mL/min   GFR calc Af Amer >90 >90 mL/min   Anion gap 13 5 - 15  CBC with Differential     Status: Abnormal   Collection Time: 07/06/14  2:30 PM  Result Value Ref Range   WBC 9.2 4.0 - 10.5 K/uL   RBC 4.28 3.87 - 5.11 MIL/uL   Hemoglobin 13.0 12.0 - 15.0 g/dL   HCT 40.9 36.0 - 46.0 %   MCV 95.6 78.0 - 100.0 fL   MCH 30.4 26.0 - 34.0 pg   MCHC 31.8 30.0 - 36.0 g/dL   RDW 13.6 11.5 - 15.5 %   Platelets 171 150 - 400 K/uL   Neutrophils Relative % 86 (H) 43 - 77 %   Neutro Abs 7.8 (H) 1.7 - 7.7 K/uL   Lymphocytes Relative 7 (L) 12 - 46 %   Lymphs Abs 0.7 0.7 - 4.0 K/uL   Monocytes Relative 6 3 - 12 %   Monocytes Absolute 0.6 0.1 - 1.0 K/uL  Eosinophils Relative 1 0 - 5 %   Eosinophils Absolute 0.1 0.0 - 0.7 K/uL   Basophils Relative 0 0 - 1 %   Basophils Absolute 0.0 0.0 - 0.1 K/uL  Type and screen     Status: None   Collection Time: 07/06/14  3:02 PM  Result Value Ref Range   ABO/RH(D) A POS    Antibody Screen NEG    Sample Expiration 07/09/2014   Surgical pcr screen     Status: None   Collection Time: 07/07/14  5:16 AM  Result Value Ref Range   MRSA, PCR NEGATIVE NEGATIVE   Staphylococcus aureus NEGATIVE NEGATIVE    X-ray: CLINICAL DATA: Fall, left hip pain  EXAM: LEFT HIP - COMPLETE 2+ VIEW  COMPARISON: None.  FINDINGS: Three views of the left hip submitted. There is mild displaced fracture of left femoral neck. Degenerative changes right hip joint. Diffuse osteopenia.  IMPRESSION: Mild displaced fracture of left femoral neck. Diffuse osteopenia  ROS   Pt complains of some mild soreness on left side of her hip. Also has some difficulty with very deep inspiration, but otherwise no other complaints..  Pt denies any headaches, vision changes, dysphagia,  chest pain, palpitations, wheeze, cough, abdominal pain, hematuria, dysuria, constipation, diarrhea, focal extremity numbness or weakness or pain. Review of systems are otherwise negative  Blood pressure 176/77, pulse 68, temperature 98.4 F (36.9 C), temperature source Oral, resp. rate 16, height 5\' 4"  (1.626 m), weight 62.596 kg (138 lb), SpO2 97 %.  Physical Exam  Awake alert. Neighbors in room with her, no immediate family support UE- normal, no acute findings Right LE no acute exam findings  Left LE normally aligned, NVI Pain with movement thus limited  General medical exam reviewed from admission notes and H&P  Assessment/Plan: Non-displaced, valgus oriented left femoral neck fracture   Plan: NPO after midnight Consent to be signed by patient after reviewing surgical options. Reviewed pros and cons of perc screws versus hemiarthroplasty including risks of infection, non-union, AVN and need for additional surgery Plan is for closed reduction percutaneous cannulated screw fixation of left hip  Will need post op [facility] for rehab prior to returning to independent living  Morris Longenecker D 07/07/2014, 11:52 AM

## 2014-07-07 NOTE — Brief Op Note (Signed)
07/06/2014 - 07/07/2014  1:04 PM  PATIENT:  Megan Olsen  80 y.o. female  PRE-OPERATIVE DIAGNOSIS:  Non-displaced left femoral neck fracture  POST-OPERATIVE DIAGNOSIS:  Non-displaced left femoral neck fracture  PROCEDURE:  Procedure(s): Closed reduction percutaneous CANNULATED HIP PINNING (Left)  SURGEON:  Surgeon(s) and Role:    * Mauri Pole, MD - Primary  PHYSICIAN ASSISTANT: None  ANESTHESIA:   general  EBL:  Total I/O In: 0  Out: 225 [Urine:225]  BLOOD ADMINISTERED:none  DRAINS: none   LOCAL MEDICATIONS USED:  NONE  SPECIMEN:  No Specimen  DISPOSITION OF SPECIMEN:  N/A  COUNTS:  YES  TOURNIQUET:  * No tourniquets in log *  DICTATION: .Other Dictation: Dictation Number [id number]  PLAN OF CARE: Admit to inpatient   PATIENT DISPOSITION:  PACU - hemodynamically stable.   Delay start of Pharmacological VTE agent (>24hrs) due to surgical blood loss or risk of bleeding: no

## 2014-07-07 NOTE — Progress Notes (Signed)
[facility]                                                                              Patient Demographics  Megan Olsen, is a 80 y.o. female, DOB - 12/31/1927, HWE:[id number]  Admit date - 07/06/2014   Admitting Physician Annita Brod, MD  Outpatient Primary MD for the patient is Nyoka Cowden, MD  LOS - 1   Chief Complaint  Patient presents with  . Fall      HPI on 07/06/2014 by Dr. Gevena Barre Megan Olsen is a 80 y.o. female with history of atrial fibrillation status post catheter ablation, osteoporosis, hypertension and CVA who is normally quite functional and today suffered a mechanical fall when she tripped on her carpet landing on her left side. She was unable to stand and she had severe pain in her left hip and she was able to call for a neighbor who summoned paramedics. Patient was brought into the emergency room and x-rays done noted a left displaced femoral neck fracture. Lab work was done which was unrevealing. EKG noted what looked to be atrial fibrillation rate controlled with a right bundle branch block which has been seen previously. Hospitalists were called for further evaluation and admission and orthopedics were notified.  Assessment & Plan   Left hip fracture -Secondary to ground-level fall -Orthopedics consulted and appreciated -Surgical intervention planned for today -Continue pain control as needed -PT and OT consulted after surgery -Patient will likely need nursing home placement for rehabilitation post surgery  Atrial fibrillation status post catheter ablation -Patient currently rate controlled however not rhythm controlled -Patient states she was on anticoagulation one point time however this was discontinued -Patient states she takes metoprolol for rate regulation  Essential hypertension -Will add on when necessary IV medications as patient is NPO for surgery  GERD -Continue PPI  Anxiety -Home medications  currently held  History of CVA -Patient has no residual deficits -MRI in 2003 showed some cortical infarcts -Patient is not taking any aspirin however should be post surgery  Code Status: DO NOT RESUSCITATE  Family Communication: friends at bedside  Disposition Plan: Admitted, will likely need SNF  Time Spent in minutes   30 minutes  Procedures  None  Consults   Orthopedic surgery  DVT Prophylaxis  SCDs  Lab Results  Component Value Date   PLT 171 07/06/2014    Medications  Scheduled Meds: . docusate sodium  100 mg Oral BID  . pantoprazole  80 mg Oral Daily   Continuous Infusions: . sodium chloride 75 mL/hr at 07/07/14 0511   PRN Meds:.HYDROcodone-acetaminophen, methocarbamol, morphine injection  Antibiotics    Anti-infectives    None      Subjective:   Megan Olsen seen and examined today.  Patient denies any chest pain or shortness breath at this time. Does continue to have some pain in her left lower extremity.  Objective:   Filed Vitals:   07/06/14 1530 07/06/14 2018 07/06/14 2020 07/07/14 0513  BP: 195/76 157/57  176/77  Pulse: 66 62  68  Temp:  98.3 F (36.8 C)  98.4 F (36.9 C)  TempSrc:  Oral  Oral  Resp: 19 14  16  Height:   5\' 4"  (1.626 m)   Weight:   62.596 kg (138 lb)   SpO2: 91% 96%  97%    Wt Readings from Last 3 Encounters:  07/06/14 62.596 kg (138 lb)  06/12/14 62.143 kg (137 lb)  08/11/13 58.06 kg (128 lb)     Intake/Output Summary (Last 24 hours) at 07/07/14 1207 Last data filed at 07/07/14 1034  Gross per 24 hour  Intake   1080 ml  Output   1350 ml  Net   -270 ml    Exam  General: Well developed, well nourished, NAD, appears stated age  57: NCAT,mucous membranes moist.   Cardiovascular: S1 S2 auscultated, irregular rhythm, soft murmur  Respiratory: Clear to auscultation bilaterally with equal chest rise  Abdomen: Soft, nontender, nondistended, + bowel sounds  Extremities: warm dry without cyanosis  clubbing or edema.  Pain with palpation of the left lower extremity  Neuro: AAOx3, no focal deficits  Psych: Normal affect and demeanor with intact judgement and insight  Data Review   Micro Results Recent Results (from the past 240 hour(s))  Surgical pcr screen     Status: None   Collection Time: 07/07/14  5:16 AM  Result Value Ref Range Status   MRSA, PCR NEGATIVE NEGATIVE Final   Staphylococcus aureus NEGATIVE NEGATIVE Final    Comment:        The Xpert SA Assay (FDA approved for NASAL specimens in patients over 26 years of age), is one component of a comprehensive surveillance program.  Test performance has been validated by [employer] for patients greater than or equal to 73 year old. It is not intended to diagnose infection nor to guide or monitor treatment.     Radiology Reports Dg Lumbar Spine Complete  07/06/2014   CLINICAL DATA:  Fall, back pain, left hip pain  EXAM: LUMBAR SPINE - COMPLETE 4+ VIEW  COMPARISON:  05/02/2012  FINDINGS: Four views of lumbar spine submitted. There is diffuse osteopenia. There is disc space flattening with vacuum disc phenomenon at L4-L5 level. Stable mild compression deformity upper endplate of L1 vertebral body of indeterminate age.  IMPRESSION: No acute fracture or subluxation. Disc space flattening at L4-L5 level. Stable mild compression deformity upper endplate of L1 vertebral body of indeterminate age.   Electronically Signed   By: Lahoma Crocker M.D.   On: 07/06/2014 14:31   Dg Hip Complete Left  07/06/2014   CLINICAL DATA:  Fall, left hip pain  EXAM: LEFT HIP - COMPLETE 2+ VIEW  COMPARISON:  None.  FINDINGS: Three views of the left hip submitted. There is mild displaced fracture of left femoral neck. Degenerative changes right hip joint. Diffuse osteopenia.  IMPRESSION: Mild displaced fracture of left femoral neck.  Diffuse osteopenia.   Electronically Signed   By: Lahoma Crocker M.D.   On: 07/06/2014 14:25    CBC  Recent Labs Lab  07/06/14 1430  WBC 9.2  HGB 13.0  HCT 40.9  PLT 171  MCV 95.6  MCH 30.4  MCHC 31.8  RDW 13.6  LYMPHSABS 0.7  MONOABS 0.6  EOSABS 0.1  BASOSABS 0.0    Chemistries   Recent Labs Lab 07/06/14 1430  NA 138  K 4.1  CL 102  CO2 23  GLUCOSE 87  BUN 16  CREATININE 0.66  CALCIUM 9.3   ------------------------------------------------------------------------------------------------------------------ estimated creatinine clearance is 45.2 mL/min (by C-G formula based on Cr of 0.66). ------------------------------------------------------------------------------------------------------------------ No results for input(s): HGBA1C in the last 72  hours. ------------------------------------------------------------------------------------------------------------------ No results for input(s): CHOL, HDL, LDLCALC, TRIG, CHOLHDL, LDLDIRECT in the last 72 hours. ------------------------------------------------------------------------------------------------------------------ No results for input(s): TSH, T4TOTAL, T3FREE, THYROIDAB in the last 72 hours.  Invalid input(s): FREET3 ------------------------------------------------------------------------------------------------------------------ No results for input(s): VITAMINB12, FOLATE, FERRITIN, TIBC, IRON, RETICCTPCT in the last 72 hours.  Coagulation profile No results for input(s): INR, PROTIME in the last 168 hours.  No results for input(s): DDIMER in the last 72 hours.  Cardiac Enzymes No results for input(s): CKMB, TROPONINI, MYOGLOBIN in the last 168 hours.  Invalid input(s): CK ------------------------------------------------------------------------------------------------------------------ Invalid input(s): POCBNP    Jlee Harkless D.O. on 07/07/2014 at 12:07 PM  Between 7am to 7pm - Pager - [phone]  After 7pm go to [email or web address] - password [email or web address]  And look for the night coverage person covering for me after  hours  [facility] Office  [phone]

## 2014-07-07 NOTE — Anesthesia Preprocedure Evaluation (Addendum)
Anesthesia Evaluation  Patient identified by MRN, date of birth, ID band Patient awake    Reviewed: Allergy & Precautions, H&P , NPO status , Patient's Chart, lab work & pertinent test results  History of Anesthesia Complications Negative for: history of anesthetic complications  Airway Mallampati: II  TM Distance: >3 FB Neck ROM: Full    Dental no notable dental hx. (+) Dental Advisory Given, Poor Dentition   Pulmonary neg pulmonary ROS,  breath sounds clear to auscultation  Pulmonary exam normal       Cardiovascular hypertension, Pt. on medications + dysrhythmias Atrial Fibrillation + Valvular Problems/Murmurs MR Rhythm:Regular Rate:Normal     Neuro/Psych PSYCHIATRIC DISORDERS Anxiety Depression CVA, No Residual Symptoms    GI/Hepatic Neg liver ROS, GERD-  Medicated and Controlled,  Endo/Other  negative endocrine ROS  Renal/GU negative Renal ROS  negative genitourinary   Musculoskeletal  (+) Arthritis -, Osteoarthritis,    Abdominal   Peds negative pediatric ROS (+)  Hematology negative hematology ROS (+)   Anesthesia Other Findings   Reproductive/Obstetrics negative OB ROS                            Anesthesia Physical Anesthesia Plan  ASA: III  Anesthesia Plan: General   Post-op Pain Management:    Induction: Intravenous  Airway Management Planned: Oral ETT  Additional Equipment:   Intra-op Plan:   Post-operative Plan: Extubation in OR  Informed Consent: I have reviewed the patients History and Physical, chart, labs and discussed the procedure including the risks, benefits and alternatives for the proposed anesthesia with the patient or authorized representative who has indicated his/her understanding and acceptance.   Dental advisory given  Plan Discussed with: CRNA  Anesthesia Plan Comments: (Discussed DNR with patient and she is of the understanding that it will be  our clinical judgement as to the interventions necessary during surgery. Discussed that no heroic measures will be taken in the perioperative period but we will resuscitate physiology that is from our anesthetics and use our best judgement as to the extent of resuscitation that is appropriate. She is very agreeable to this plan. )       Anesthesia Quick Evaluation

## 2014-07-08 LAB — BASIC METABOLIC PANEL
Anion gap: 11 (ref 5–15)
BUN: 10 mg/dL (ref 6–23)
CO2: 27 mEq/L (ref 19–32)
CREATININE: 0.81 mg/dL (ref 0.50–1.10)
Calcium: 8.3 mg/dL — ABNORMAL LOW (ref 8.4–10.5)
Chloride: 102 mEq/L (ref 96–112)
GFR, EST AFRICAN AMERICAN: 75 mL/min — AB (ref >90)
GFR, EST NON AFRICAN AMERICAN: 65 mL/min — AB (ref >90)
Glucose, Bld: 109 mg/dL — ABNORMAL HIGH (ref 70–99)
Potassium: 4.4 mEq/L (ref 3.7–5.3)
Sodium: 140 mEq/L (ref 137–147)

## 2014-07-08 LAB — CBC
HCT: 34.2 % — ABNORMAL LOW (ref 36.0–46.0)
Hemoglobin: 11.2 g/dL — ABNORMAL LOW (ref 12.0–15.0)
MCH: 31 pg (ref 26.0–34.0)
MCHC: 32.7 g/dL (ref 30.0–36.0)
MCV: 94.7 fL (ref 78.0–100.0)
Platelets: 110 10*3/uL — ABNORMAL LOW (ref 150–400)
RBC: 3.61 MIL/uL — ABNORMAL LOW (ref 3.87–5.11)
RDW: 13.5 % (ref 11.5–15.5)
WBC: 7.3 10*3/uL (ref 4.0–10.5)

## 2014-07-08 MED ORDER — SALINE SPRAY 0.65 % NA SOLN
1.0000 | NASAL | Status: DC | PRN
  Filled 2014-07-08: qty 44

## 2014-07-08 NOTE — Progress Notes (Signed)
Patient ID: ARIELY RIDDELL, female   DOB: 06/21/1928, 80 y.o.   MRN: [id number] Subjective: 1 Day Post-Op Procedure(s) (LRB): CANNULATED HIP PINNING (Left)    Patient reports pain as mild.  Feels pretty good this am, no significant pain issues  Objective:   VITALS:   Filed Vitals:   07/08/14 0602  BP: 110/48  Pulse: 67  Temp: 97.8 F (36.6 C)  Resp: 16    Neurovascular intact Incision: dressing C/D/I  LABS  Recent Labs  07/06/14 1430 07/08/14 0530  HGB 13.0 11.2*  HCT 40.9 34.2*  WBC 9.2 7.3  PLT 171 110*     Recent Labs  07/06/14 1430 07/08/14 0530  NA 138 140  K 4.1 4.4  BUN 16 10  CREATININE 0.66 0.81  GLUCOSE 87 109*    No results for input(s): LABPT, INR in the last 72 hours.   Assessment/Plan: 1 Day Post-Op Procedure(s) (LRB): CANNULATED HIP PINNING (Left)   Advance diet Up with therapy Discharge to SNF most likely Monday or Tuesday based on planning capabilities PWB LLE

## 2014-07-08 NOTE — Evaluation (Signed)
Occupational Therapy Evaluation Patient Details Name: Megan Olsen MRN: [id number] DOB: 12/11/1927 Today's Date: 07/08/2014    History of Present Illness s/p ORIF  L hip; PMHx: afib   Clinical Impression   Pt admitted with above.  Currently, she requires min A for BADLs and functional transfers.  She lives alone and will need SNF level rehab at discharge.  All further OT needs can be met at [facility].  Acute OT will sign off.     Follow Up Recommendations  SNF    Equipment Recommendations  None recommended by OT    Recommendations for Other Services       Precautions / Restrictions Precautions Precautions: Fall Restrictions Weight Bearing Restrictions: Yes LLE Weight Bearing: Partial weight bearing LLE Partial Weight Bearing Percentage or Pounds: 50      Mobility Bed Mobility               General bed mobility comments: pt in chair  Transfers Overall transfer level: Needs assistance Equipment used: Rolling walker (2 wheeled) Transfers: Sit to/from [facility] Sit to Stand: Min guard Stand pivot transfers: Min assist       General transfer comment: cues for hand  placement and safety (avoid standing without the RW)    Balance Overall balance assessment: History of Falls                                          ADL Overall ADL's : Needs assistance/impaired Eating/Feeding: Independent   Grooming: Wash/dry hands;Wash/dry face;Oral care;Brushing hair;Set up;Sitting   Upper Body Bathing: Supervision/ safety;Sitting   Lower Body Bathing: Minimal assistance;Sit to/from stand   Upper Body Dressing : Set up;Sitting   Lower Body Dressing: Minimal assistance;Sit to/from stand   Toilet Transfer: Minimal assistance;Stand-pivot;BSC   Toileting- Clothing Manipulation and Hygiene: Minimal assistance;Sit to/from stand       Functional mobility during ADLs: Minimal assistance;Rolling walker General ADL Comments: Pt able to bend  forward to access mid foot for LB ADLs.  Able to doff socks, but unable to pull them over toes.      Vision                     Perception     Praxis      Pertinent Vitals/Pain Pain Assessment: 0-10 Pain Score: 1  Pain Location: Lt hip Pain Descriptors / Indicators: Aching Pain Intervention(s): Monitored during session     Hand Dominance Right   Extremity/Trunk Assessment Upper Extremity Assessment Upper Extremity Assessment: Overall WFL for tasks assessed   Lower Extremity Assessment Lower Extremity Assessment: Defer to PT evaluation LLE Deficits / Details: AAROM WFL; strength 3/5 hip and knee       Communication Communication Communication: No difficulties   Cognition Arousal/Alertness: Awake/alert Behavior During Therapy: WFL for tasks assessed/performed Overall Cognitive Status: Within Functional Limits for tasks assessed                     General Comments       Exercises       Shoulder Instructions      Home Living Family/patient expects to be discharged to:: Skilled nursing facility Living Arrangements: Alone  Prior Functioning/Environment Level of Independence: Independent        Comments: pt is very active    OT Diagnosis: Generalized weakness;Acute pain   OT Problem List: Decreased strength;Decreased knowledge of use of DME or AE;Pain   OT Treatment/Interventions:      OT Goals(Current goals can be found in the care plan section) Acute Rehab OT Goals Patient Stated Goal: to get back to I OT Goal Formulation: All assessment and education complete, DC therapy  OT Frequency:     Barriers to D/C:            Co-evaluation              End of Session Equipment Utilized During Treatment: Rolling walker Nurse Communication: Mobility status  Activity Tolerance: Patient tolerated treatment well Patient left: in chair;with call bell/phone within reach;with  family/visitor present   Time: 1129-1140 OT Time Calculation (min): 11 min Charges:  OT General Charges $OT Visit: 1 Procedure OT Evaluation $Initial OT Evaluation Tier I: 1 Procedure OT Treatments $Self Care/Home Management : 8-22 mins G-Codes:    Braysen Cloward M 2014/07/29, 1:50 PM

## 2014-07-08 NOTE — Progress Notes (Signed)
CARE MANAGEMENT NOTE 07/08/2014  Patient:  CONTRINA, ORONA   Account Number:  [id number]  Date Initiated:  07/08/2014  Documentation initiated by:  [facility]  Subjective/Objective Assessment:   s/p left canulated hip pinning     Action/Plan:   SNF   Anticipated DC Date:     Anticipated DC Plan:  [place]  CM consult      Choice offered to / List presented to:             Status of service:  In process, will continue to follow Medicare Important Message given?   (If response is "NO", the following Medicare IM given date fields will be blank) Date Medicare IM given:   Medicare IM given by:   Date Additional Medicare IM given:   Additional Medicare IM given by:    Discharge Disposition:    Per UR Regulation:    If discussed at Long Length of Stay Meetings, dates discussed:    Comments:  07/08/2014 1545 Chart reveiwed. Spoke to pt and she prefers SNF-rehab. She lives alone. CSW referral for SNF placement. Jonnie Finner RN CCM Case Mgmt phone [phone]

## 2014-07-08 NOTE — Progress Notes (Signed)
[facility]                                                                              Patient Demographics  Megan Olsen, is a 80 y.o. female, DOB - 1927-11-05, QVZ:[id number]  Admit date - 07/06/2014   Admitting Physician Annita Brod, MD  Outpatient Primary MD for the patient is Nyoka Cowden, MD  LOS - 2   Chief Complaint  Patient presents with  . Fall      HPI on 07/06/2014 by Dr. Gevena Barre Megan Olsen is a 80 y.o. female with history of atrial fibrillation status post catheter ablation, osteoporosis, hypertension and CVA who is normally quite functional and today suffered a mechanical fall when she tripped on her carpet landing on her left side. She was unable to stand and she had severe pain in her left hip and she was able to call for a neighbor who summoned paramedics. Patient was brought into the emergency room and x-rays done noted a left displaced femoral neck fracture. Lab work was done which was unrevealing. EKG noted what looked to be atrial fibrillation rate controlled with a right bundle branch block which has been seen previously. Hospitalists were called for further evaluation and admission and orthopedics were notified.  Assessment & Plan   Left hip fracture -Secondary to ground-level fall -Orthopedics consulted and appreciated -s/p left canulated hip pinning -Continue pain control as needed -PT and OT consulted, pending evaluation -Patient will likely need nursing home placement for rehabilitation  Atrial fibrillation status post catheter ablation -Patient currently rate controlled however not rhythm controlled -Patient states she was on anticoagulation one point time however this was discontinued -Patient states she was taking metoprolol for rate regulation however her medications were changed  Essential hypertension -Continue Ziac  GERD -Continue PPI  Anxiety -Continue ativan  History of CVA -Patient has no  residual deficits -MRI in 2003 showed some cortical infarcts -Continue aspirin  Code Status: DO NOT RESUSCITATE  Family Communication: None at bedside  Disposition Plan: Admitted, Pending PT/OT consults, will likely need SNF  Time Spent in minutes   30 minutes  Procedures  None  Consults   Orthopedic surgery  DVT Prophylaxis  SCDs  Lab Results  Component Value Date   PLT 110* 07/08/2014    Medications  Scheduled Meds: . aspirin EC  325 mg Oral BID  . bisoprolol-hydrochlorothiazide  1 tablet Oral Daily  . cholecalciferol  1,000 Units Oral Daily  . docusate sodium  100 mg Oral BID  . estradiol  1 mg Oral QHS  . ipratropium  2 spray Each Nare Q12H  . LORazepam  1 mg Oral QHS  . meloxicam  15 mg Oral Daily  . omega-3 acid ethyl esters  3 g Oral Daily  . pantoprazole  80 mg Oral Daily   Continuous Infusions: . sodium chloride 0.9 % 1,000 mL with potassium chloride 10 mEq infusion 50 mL/hr at 07/07/14 2132   PRN Meds:.acetaminophen **OR** acetaminophen, alum & mag hydroxide-simeth, hydrALAZINE, HYDROcodone-acetaminophen, HYDROcodone-acetaminophen, meclizine, menthol-cetylpyridinium **OR** phenol, methocarbamol, metoCLOPramide **OR** metoCLOPramide (REGLAN) injection, morphine injection, morphine injection, ondansetron **OR** ondansetron (ZOFRAN) IV, polyethylene glycol, sodium chloride  Antibiotics  Anti-infectives    Start     Dose/Rate Route Frequency Ordered Stop   07/07/14 2000  clindamycin (CLEOCIN) IVPB 600 mg     600 mg100 mL/hr over 30 Minutes Intravenous Every 6 hours 07/07/14 1556 07/08/14 0316      Subjective:   Brendi Mccarroll seen and examined today.  Patient complains of mild pain in her left hip.  Denies any chest pain or shortness breath at this time.   Objective:   Filed Vitals:   07/08/14 0602 07/08/14 0800 07/08/14 0821 07/08/14 0951  BP: 110/48   166/68  Pulse: 67   67  Temp: 97.8 F (36.6 C)   97.7 F (36.5 C)  TempSrc: Oral   Oral    Resp: 16   18  Height:      Weight:      SpO2: 97% 88% 96% 98%    Wt Readings from Last 3 Encounters:  07/06/14 62.596 kg (138 lb)  06/12/14 62.143 kg (137 lb)  08/11/13 58.06 kg (128 lb)     Intake/Output Summary (Last 24 hours) at 07/08/14 1018 Last data filed at 07/08/14 8786  Gross per 24 hour  Intake   1660 ml  Output   1625 ml  Net     35 ml    Exam  General: Well developed, well nourished, NAD, appears stated age  77: NCAT,mucous membranes moist.   Cardiovascular: S1 S2 auscultated, irregular rhythm, soft murmur  Respiratory: Clear to auscultation bilaterally with equal chest rise  Abdomen: Soft, nontender, nondistended, + bowel sounds  Extremities: warm dry without cyanosis clubbing or edema.  Bandage on left hip-clean  Neuro: AAOx3, no focal deficits  Psych: Appropriate mood and affect  Data Review   Micro Results Recent Results (from the past 240 hour(s))  Surgical pcr screen     Status: None   Collection Time: 07/07/14  5:16 AM  Result Value Ref Range Status   MRSA, PCR NEGATIVE NEGATIVE Final   Staphylococcus aureus NEGATIVE NEGATIVE Final    Comment:        The Xpert SA Assay (FDA approved for NASAL specimens in patients over 24 years of age), is one component of a comprehensive surveillance program.  Test performance has been validated by [employer] for patients greater than or equal to 26 year old. It is not intended to diagnose infection nor to guide or monitor treatment.     Radiology Reports Dg Lumbar Spine Complete  07/06/2014   CLINICAL DATA:  Fall, back pain, left hip pain  EXAM: LUMBAR SPINE - COMPLETE 4+ VIEW  COMPARISON:  05/02/2012  FINDINGS: Four views of lumbar spine submitted. There is diffuse osteopenia. There is disc space flattening with vacuum disc phenomenon at L4-L5 level. Stable mild compression deformity upper endplate of L1 vertebral body of indeterminate age.  IMPRESSION: No acute fracture or subluxation.  Disc space flattening at L4-L5 level. Stable mild compression deformity upper endplate of L1 vertebral body of indeterminate age.   Electronically Signed   By: Lahoma Crocker M.D.   On: 07/06/2014 14:31   Dg Hip Complete Left  07/07/2014   CLINICAL DATA:  Left hip fracture status post ORIF.  EXAM: DG C-ARM 1-60 MIN - NRPT [facility]; LEFT HIP - COMPLETE 2+ VIEW  COMPARISON:  07/06/2014  FINDINGS: Postsurgical hardware compatible with ORIF left femoral neck fracture. Hardware demonstrated on multiple fluoroscopic images.  IMPRESSION: Status post ORIF left femoral neck fracture.   Electronically Signed   By: Dian Situ  Rosana Hoes M.D.   On: 07/07/2014 14:18   Dg Hip Complete Left  07/06/2014   CLINICAL DATA:  Fall, left hip pain  EXAM: LEFT HIP - COMPLETE 2+ VIEW  COMPARISON:  None.  FINDINGS: Three views of the left hip submitted. There is mild displaced fracture of left femoral neck. Degenerative changes right hip joint. Diffuse osteopenia.  IMPRESSION: Mild displaced fracture of left femoral neck.  Diffuse osteopenia.   Electronically Signed   By: Lahoma Crocker M.D.   On: 07/06/2014 14:25   Dg C-arm 1-60 Min-no Report  07/07/2014   CLINICAL DATA:  Left hip fracture status post ORIF.  EXAM: DG C-ARM 1-60 MIN - NRPT [facility]; LEFT HIP - COMPLETE 2+ VIEW  COMPARISON:  07/06/2014  FINDINGS: Postsurgical hardware compatible with ORIF left femoral neck fracture. Hardware demonstrated on multiple fluoroscopic images.  IMPRESSION: Status post ORIF left femoral neck fracture.   Electronically Signed   By: Lovey Newcomer M.D.   On: 07/07/2014 14:18    CBC  Recent Labs Lab 07/06/14 1430 07/08/14 0530  WBC 9.2 7.3  HGB 13.0 11.2*  HCT 40.9 34.2*  PLT 171 110*  MCV 95.6 94.7  MCH 30.4 31.0  MCHC 31.8 32.7  RDW 13.6 13.5  LYMPHSABS 0.7  --   MONOABS 0.6  --   EOSABS 0.1  --   BASOSABS 0.0  --     Chemistries   Recent Labs Lab 07/06/14 1430 07/08/14 0530  NA 138 140  K 4.1 4.4  CL 102 102  CO2 23 27  GLUCOSE 87  109*  BUN 16 10  CREATININE 0.66 0.81  CALCIUM 9.3 8.3*   ------------------------------------------------------------------------------------------------------------------ estimated creatinine clearance is 44.6 mL/min (by C-G formula based on Cr of 0.81). ------------------------------------------------------------------------------------------------------------------ No results for input(s): HGBA1C in the last 72 hours. ------------------------------------------------------------------------------------------------------------------ No results for input(s): CHOL, HDL, LDLCALC, TRIG, CHOLHDL, LDLDIRECT in the last 72 hours. ------------------------------------------------------------------------------------------------------------------ No results for input(s): TSH, T4TOTAL, T3FREE, THYROIDAB in the last 72 hours.  Invalid input(s): FREET3 ------------------------------------------------------------------------------------------------------------------ No results for input(s): VITAMINB12, FOLATE, FERRITIN, TIBC, IRON, RETICCTPCT in the last 72 hours.  Coagulation profile No results for input(s): INR, PROTIME in the last 168 hours.  No results for input(s): DDIMER in the last 72 hours.  Cardiac Enzymes No results for input(s): CKMB, TROPONINI, MYOGLOBIN in the last 168 hours.  Invalid input(s): CK ------------------------------------------------------------------------------------------------------------------ Invalid input(s): POCBNP    Keili Hasten D.O. on 07/08/2014 at 10:18 AM  Between 7am to 7pm - Pager - [phone]  After 7pm go to [email or web address] - password [email or web address]  And look for the night coverage person covering for me after hours  [facility] Office  [phone]

## 2014-07-08 NOTE — Op Note (Signed)
[name], WROBLESKI NO.:  [id number]  MEDICAL RECORD NO.:  [id number]  LOCATION:  58                         FACILITY:  [facility]  PHYSICIAN:  Pietro Cassis. Alvan Dame, M.D.  DATE OF BIRTH:  12-23-1927  DATE OF PROCEDURE:  07/07/2014 DATE OF DISCHARGE:                              OPERATIVE REPORT   PREOPERATIVE DIAGNOSIS:  Nondisplaced left femoral neck fracture.  POSTOPERATIVE DIAGNOSIS:  Nondisplaced left femoral neck fracture.  PROCEDURE:  Closed reduction and percutaneous cannulated screw fixation of left hip utilizing Biomet screw.  SURGEON:  Pietro Cassis. Alvan Dame, M.D.  ASSISTANT:  Surgical team.  ANESTHESIA:  General.  SPECIMENS:  None.  COMPLICATIONS:  None.  BLOOD LOSS:  Minimal.  INDICATIONS FOR PROCEDURE:  Megan Olsen is an 80 year old female, who lives independently, who unfortunately had a ground-level fall at home. She was brought to the emergency room where radiographs revealed a valgus nondisplaced fracture of the left hip.  I discussed with her options, pros and cons of screw fixation versus hemiarthroplasty.  The risks of infection, DVT, specifically nonunion or malunion, or avascular necrosis, requiring conversion to total hip arthroplasty reviewed and discussed.  Consent was obtained for percutaneous cannulation of screw fixation for benefit of maintaining her native hip joint and fracture.  PROCEDURE IN DETAIL:  The patient was brought to the operative theater. Once adequate anesthesia, preoperative antibiotics, clindamycin administered, she was positioned supine with her left leg in leg holder. The right leg was flexed and abducted with bony prominences padded particularly over the peroneal post and peroneal nerve.  Padded perineal post was placed.  Under fluoroscopic imaging, I evaluated the fracture with gentle internal rotation and traction for general support. Fracture was anatomically reduced in AP and lateral planes.  Given  these findings, the left hip was then prepped and draped in sterile fashion using shower curtain technique.  Fluoroscopy was brought back to the field following the time-out identifying the patient, planned procedure, and extremity.  Under fluoroscopic imaging, identified orientation for screw placement.  An incision was then made laterally at the level of the lesser trochanter. First, guidewire was centered within the femur and then into the inferior aspect of the femoral neck into the head.  This was confirmed radiographically.  I then elected to place three other pins to provide extra support to her osteoporotic bone; one posterior, one more central and anterior and then one more anterior and proximal.  Once these were all placed and confirmed radiographically, I drilled the lateral cortex, I had measured the depth and then placed the screws and tightened them by hand with excellent bony purchase.  Guidepins were removed and final radiographs were obtained in AP and lateral planes.  Given the stability of the fracture site, wound was irrigated.  I reapproximated the iliotibial band incision using #1 Vicryl with remainder of the wound was closed with 2-0 Vicryl and running 4-0 Monocryl.  The hip wound was clean, dry, and dressed sterilely using surgical glue and Aquacel.  She was brought to the recovery room in stable condition, extubated, tolerating the procedure well.  I am going to have her be partial weightbearing until radiographs remain stable.  Follow  up will be in 2 weeks for wound check and radiographs.     Pietro Cassis Alvan Dame, M.D.     MDO/MEDQ  D:  07/07/2014  T:  07/08/2014  Job:  [id number]

## 2014-07-08 NOTE — Evaluation (Signed)
Physical Therapy Evaluation Patient Details Name: Megan Olsen MRN: [id number] DOB: [birth year] Today's Date: 07/08/2014   History of Present Illness  s/p ORIF  L hip; PMHx: afib  Clinical Impression  Pt admitted with above diagnosis. Pt currently with functional limitations due to the deficits listed below (see PT Problem List). Pt will benefit from skilled PT to increase their independence and safety with mobility to allow discharge to the venue listed below.  Pt is agreeable to SNF for continued rehab; will continue to follow     Follow Up Recommendations SNF (pt would like to go home, planning SNF for now)    Equipment Recommendations  Rolling walker with 5" wheels    Recommendations for Other Services       Precautions / Restrictions Precautions Precautions: Fall Restrictions LLE Weight Bearing: Partial weight bearing LLE Partial Weight Bearing Percentage or Pounds: 50      Mobility  Bed Mobility               General bed mobility comments: pt in chair  Transfers Overall transfer level: Needs assistance Equipment used: Rolling walker (2 wheeled) Transfers: Sit to/from Stand Sit to Stand: Min assist;Min guard         General transfer comment: cues for hand  placement and safety (avoid standing without the RW)  Ambulation/Gait Ambulation/Gait assistance: Min assist Ambulation Distance (Feet): 12 Feet (x 2) Assistive device: Rolling walker (2 wheeled) Gait Pattern/deviations: Step-to pattern;Trunk flexed;Narrow base of support     General Gait Details: multi-modal, repetitious cues for sequence, PWB  Stairs            Wheelchair Mobility    Modified Rankin (Stroke Patients Only)       Balance Overall balance assessment: History of Falls                                           Pertinent Vitals/Pain Pain Assessment: 0-10 Pain Score: 1  Pain Location: L hip  Pain Descriptors / Indicators: Discomfort Pain  Intervention(s): Premedicated before session    Home Living Family/patient expects to be discharged to:: Skilled nursing facility Living Arrangements: Alone                    Prior Function Level of Independence: Independent         Comments: pt is very active     Hand Dominance        Extremity/Trunk Assessment   Upper Extremity Assessment: Defer to OT evaluation           Lower Extremity Assessment: LLE deficits/detail   LLE Deficits / Details: AAROM WFL; strength 3/5 hip and knee     Communication   Communication: No difficulties  Cognition Arousal/Alertness: Awake/alert Behavior During Therapy: WFL for tasks assessed/performed Overall Cognitive Status: Within Functional Limits for tasks assessed                      General Comments      Exercises  ankle pumps, quad sets, heel slides x 7 reps       Assessment/Plan    PT Assessment Patient needs continued PT services  PT Diagnosis Difficulty walking   PT Problem List Decreased strength;Decreased activity tolerance;Decreased balance;Decreased mobility;Decreased knowledge of use of DME;Decreased knowledge of precautions  PT Treatment Interventions DME instruction;Gait training;Functional mobility training;Therapeutic activities;Therapeutic exercise;Patient/family education  PT Goals (Current goals can be found in the Care Plan section) Acute Rehab PT Goals Patient Stated Goal: to get back to I PT Goal Formulation: With patient Time For Goal Achievement: 07/15/14 Potential to Achieve Goals: Good    Frequency Min 3X/week   Barriers to discharge        Co-evaluation               End of Session Equipment Utilized During Treatment: Gait belt Activity Tolerance: Patient tolerated treatment well Patient left: with call bell/phone within reach           Time: 9604-5409 PT Time Calculation (min) (ACUTE ONLY): 36 min   Charges:   PT Evaluation $Initial PT Evaluation  Tier I: 1 Procedure PT Treatments $Gait Training: 8-22 mins $Therapeutic Activity: 8-22 mins   PT G Codes:          Megan Olsen 08-02-2014, 1:22 PM

## 2014-07-08 NOTE — Progress Notes (Signed)
Nutrition Brief Note  RD consulted for nutritional assessment.  Pt with weight gain in the past year.  Wt Readings from Last 15 Encounters:  07/06/14 138 lb (62.596 kg)  06/12/14 137 lb (62.143 kg)  08/11/13 128 lb (58.06 kg)  04/10/13 125 lb (56.7 kg)  02/14/13 126 lb (57.153 kg)  01/25/13 127 lb (57.607 kg)  01/17/13 127 lb 3.2 oz (57.698 kg)  01/02/13 135 lb (61.236 kg)  12/05/12 130 lb (58.968 kg)  11/28/12 135 lb (61.236 kg)  05/02/12 138 lb (62.596 kg)  02/16/12 142 lb (64.411 kg)  12/28/11 144 lb (65.318 kg)  08/28/11 146 lb (66.225 kg)  04/24/11 139 lb (63.05 kg)    Body mass index is 23.68 kg/(m^2). Patient meets criteria for normal range based on current BMI.   Current diet order is regular, patient is consuming approximately 75-100% of meals at this time. Labs and medications reviewed.   No nutrition interventions warranted at this time. If nutrition issues arise, please re-consult RD.   Clayton Bibles, MS, RD, LDN Pager: [phone] After Hours Pager: [phone]

## 2014-07-09 ENCOUNTER — Encounter (HOSPITAL_COMMUNITY): Payer: Self-pay | Admitting: Orthopedic Surgery

## 2014-07-09 LAB — CBC
HCT: 33.8 % — ABNORMAL LOW (ref 36.0–46.0)
Hemoglobin: 10.9 g/dL — ABNORMAL LOW (ref 12.0–15.0)
MCH: 30.6 pg (ref 26.0–34.0)
MCHC: 32.2 g/dL (ref 30.0–36.0)
MCV: 94.9 fL (ref 78.0–100.0)
PLATELETS: 122 10*3/uL — AB (ref 150–400)
RBC: 3.56 MIL/uL — ABNORMAL LOW (ref 3.87–5.11)
RDW: 13.7 % (ref 11.5–15.5)
WBC: 6.7 10*3/uL (ref 4.0–10.5)

## 2014-07-09 MED ORDER — LORAZEPAM 1 MG PO TABS: ORAL_TABLET | ORAL | Status: DC

## 2014-07-09 NOTE — Care Management Note (Signed)
    Page 1 of 1   07/09/2014     2:30:17 PM CARE MANAGEMENT NOTE 07/09/2014  Patient:  Megan Olsen, Megan Olsen   Account Number:  [id number]  Date Initiated:  07/08/2014  Documentation initiated by:  [facility]  Subjective/Objective Assessment:   s/p left canulated hip pinning     Action/Plan:   SNF   Anticipated DC Date:  07/10/2014   Anticipated DC Plan:  [place]  CM consult      Choice offered to / List presented to:             Status of service:  In process, will continue to follow Medicare Important Message given?  YES (If response is "NO", the following Medicare IM given date fields will be blank) Date Medicare IM given:  07/09/2014 Medicare IM given by:  [facility] Date Additional Medicare IM given:   Additional Medicare IM given by:    Discharge Disposition:    Per UR Regulation:  Reviewed for med. necessity/level of care/duration of stay  If discussed at [place] of Stay Meetings, dates discussed:    Comments:  07/09/14 Dessa Phi RN BSN NCM [phone] pod#1 l hip pinning.d/c plan snf.  07/08/2014 1545 Chart reveiwed. Spoke to pt and she prefers SNF-rehab. She lives alone. CSW referral for SNF placement. Jonnie Finner RN CCM Case Mgmt phone [phone]

## 2014-07-09 NOTE — Clinical Social Work Note (Signed)
CSW met with patient today who is agreeable to SNF for rehab at d/c. CSW also spoke with MD and will proceed with SNF search for probable d/c tomorrow per MD- full assessment to follow- Eduard Clos, MSW, [place]

## 2014-07-09 NOTE — Op Note (Signed)
Megan Olsen, Megan Olsen NO.:  [id number]  MEDICAL RECORD NO.:  [id number]  LOCATION:  10                         FACILITY:  [facility]  PHYSICIAN:  Pietro Cassis. Alvan Dame, M.D.  DATE OF BIRTH:  01-04-1928  DATE OF PROCEDURE:  07/07/2014 DATE OF DISCHARGE:                              OPERATIVE REPORT   PREOPERATIVE DIAGNOSIS:  Nondisplaced left femoral neck fracture.  POSTOPERATIVE DIAGNOSIS:  Nondisplaced left femoral neck fracture.  PROCEDURE:  Closed reduction and cannulated screw fixation to left hip fracture utilizing Biomet screws.  SURGEON:  Pietro Cassis. Alvan Dame, M.D.  ASSISTANT:  Surgical team.  ANESTHESIA:  General.  SPECIMENS:  None.  COMPLICATION:  None.  INDICATIONS FOR PROCEDURE:  [name] is an pleasant 80 year old female, who currently lives alone in a single floor apartment.  She unfortunately had a ground level fall.  In doing so, she had immediate left hip pain.  She was brought to the emergency room where radiographs revealed a nondisplaced left femoral neck fracture.  She is admitted to the medical service for fracture of pathway protocol. She was seen and evaluated, and surgical options reviewed and discussed. After reviewing with her, pros and cons, and risks and benefits of screw fixation versus hemiarthroplasty, specifically related to femoral head avascular necrosis, nonunion, malunion and need for future revision surgery, we opted to proceed with a cannulated screw fixation based on the fracture pattern that she had.  PROCEDURE DETAIL:  The patient was brought to operative theater.  Once adequate anesthesia, preoperative antibiotics administered, she was positioned supine on the fracture table.  Her right unaffected extremity was flexed and abducted out of the way.  The left foot was placed into the boot, slight gentle internal retraction and internal rotation was utilized.  Under fluoroscopic imaging, the fracture was reduced into  an anatomic position, AP and lateral planes.  Given these findings, we proceeded with a cannulated screw fixation.  The left lower extremity was then prepped and draped in sterile fashion using shower curtain technique.  A time-out was performed identifying the patient, planned procedure, and extremity.  Under fluoroscopic imaging, I then identified orientation of the femoral neck in the AP and lateral planes.  I thus identifying the starting point for incision.  At the level of the lesser trochanter, the lateral based incision was carried through the skin to the iliotibial band.  I then used a guidewires, first into the inferior aspect of the neck centrally and then one posterior proximal, one anterior proximal, and then the fourth one to provide further stability to her fracture.  Once these were appropriately positioned in the AP and lateral planes, I measured the depth, drilled the lateral cortex, and then placed the screws in order of inferior central, posterior proximal and then the 2 anterior and central ones.  Each screw was tightened down in that order to the cortex with good purchase on the bone and instability.  Final radiographs were taken with the guide pins out of the hip.  The hip wound was irrigated with normal saline solution.  I did reapproximate the iliotibial band with a #1 Vicryl suture, then used 2-0 Vicryl on the subcutaneous level and  a running Monocryl.  The hip wound was then cleaned, dried and dressed sterilely using Dermabond and Aquacel dressing.  She was then extubated and brought to the recovery room in stable condition, tolerating the procedure well.     Pietro Cassis Alvan Dame, M.D.     MDO/MEDQ  D:  07/09/2014  T:  07/09/2014  Job:  [id number]

## 2014-07-09 NOTE — Clinical Social Work Placement (Addendum)
Clinical Social Work Department CLINICAL SOCIAL WORK PLACEMENT NOTE 07/10/2014  Patient:  Megan Olsen, Megan Olsen  Account Number:  [id number] Admit date:  07/06/2014  Clinical Social Worker:  Daiva Huge  Date/time:  07/09/2014 02:45 PM  Clinical Social Work is seeking post-discharge placement for this patient at the following level of care:   [place]   (*CSW will update this form in Epic as items are completed)   07/09/2014  Patient/family provided with [place] Department of Clinical Social Work's list of facilities offering this level of care within the geographic area requested by the patient (or if unable, by the patient's family).  07/09/2014  Patient/family informed of their freedom to choose among providers that offer the needed level of care, that participate in Medicare, Medicaid or managed care program needed by the patient, have an available bed and are willing to accept the patient.  07/09/2014  Patient/family informed of [facility]' ownership interest in [facility], as well as of the fact that they are under no obligation to receive care at this facility.  PASARR submitted to EDS on 07/09/2014 PASARR number received on 07/09/2014  FL2 transmitted to all facilities in geographic area requested by pt/family on  07/09/2014 FL2 transmitted to all facilities within larger geographic area on   Patient informed that his/her managed care company has contracts with or will negotiate with  certain facilities, including the following:     Patient/family informed of bed offers received:  07/10/14 Patient chooses bed at  [facility] Physician recommends and patient chooses bed at    Patient to be transferred to [facility] on  07/10/14 Patient to be transferred to facility by [facility] Patient and family notified of transfer on 07/10/14 Name of family member notified:  Patient plans to communicate with friends/family  The following physician request were  entered in Epic:   Additional Comments: Eduard Clos, MSW, [place]

## 2014-07-09 NOTE — Progress Notes (Signed)
Patient ID: Megan Olsen, female   DOB: March 03, 1928, 80 y.o.   MRN: [id number] Subjective: 2 Days Post-Op Procedure(s) (LRB): CANNULATED HIP PINNING (Left)    Patient reports pain as mild.  Seems to be doing very well, no events, pain well controlled, in chair this am eating breakfasrt  Objective:   VITALS:   Filed Vitals:   07/09/14 0627  BP: 155/57  Pulse: 75  Temp: 97.7 F (36.5 C)  Resp: 18    Neurovascular intact Incision: dressing C/D/I  LABS  Recent Labs  07/06/14 1430 07/08/14 0530 07/09/14 0503  HGB 13.0 11.2* 10.9*  HCT 40.9 34.2* 33.8*  WBC 9.2 7.3 6.7  PLT 171 110* 122*     Recent Labs  07/06/14 1430 07/08/14 0530  NA 138 140  K 4.1 4.4  BUN 16 10  CREATININE 0.66 0.81  GLUCOSE 87 109*    No results for input(s): LABPT, INR in the last 72 hours.   Assessment/Plan: 2 Days Post-Op Procedure(s) (LRB): CANNULATED HIP PINNING (Left)   Up with therapy Discharge to SNF today if arrangements made RTC in 2 weeks

## 2014-07-09 NOTE — Progress Notes (Signed)
[facility]                                                                              Patient Demographics  Megan Olsen, is a 80 y.o. female, DOB - April 27, 1928, RAQ:[id number]  Admit date - 07/06/2014   Admitting Physician Annita Brod, MD  Outpatient Primary MD for the patient is Nyoka Cowden, MD  LOS - 3   Chief Complaint  Patient presents with  . Fall      HPI on 07/06/2014 by Dr. Gevena Barre Megan Olsen is a 80 y.o. female with history of atrial fibrillation status post catheter ablation, osteoporosis, hypertension and CVA who is normally quite functional and today suffered a mechanical fall when she tripped on her carpet landing on her left side. She was unable to stand and she had severe pain in her left hip and she was able to call for a neighbor who summoned paramedics. Patient was brought into the emergency room and x-rays done noted a left displaced femoral neck fracture. Lab work was done which was unrevealing. EKG noted what looked to be atrial fibrillation rate controlled with a right bundle branch block which has been seen previously. Hospitalists were called for further evaluation and admission and orthopedics were notified.  Assessment & Plan   Left hip fracture -Secondary to ground-level fall -Orthopedics consulted and appreciated -s/p left canulated hip pinning -Continue pain control as needed -PT and OT consulted, recommended SNF -Social work consulted for SNF  Atrial fibrillation status post catheter ablation -Patient currently rate controlled however not rhythm controlled -Patient states she was on anticoagulation one point time however this was discontinued -Patient states she was taking metoprolol for rate regulation however her medications were changed -Continue bisoprolol   Essential hypertension -Continue Ziac  GERD -Continue PPI  Anxiety -Continue ativan  History of CVA -Patient has no residual deficits -MRI  in 2003 showed some cortical infarcts -Continue aspirin  Code Status: DO NOT RESUSCITATE  Family Communication: None at bedside  Disposition Plan: Admitted, Pending SNF placement.  Tentative discharge on 07/10/2014  Time Spent in minutes   30 minutes  Procedures  None  Consults   Orthopedic surgery  DVT Prophylaxis  SCDs  Lab Results  Component Value Date   PLT 122* 07/09/2014    Medications  Scheduled Meds: . aspirin EC  325 mg Oral BID  . bisoprolol-hydrochlorothiazide  1 tablet Oral Daily  . cholecalciferol  1,000 Units Oral Daily  . docusate sodium  100 mg Oral BID  . estradiol  1 mg Oral QHS  . ipratropium  2 spray Each Nare Q12H  . LORazepam  1 mg Oral QHS  . meloxicam  15 mg Oral Daily  . omega-3 acid ethyl esters  3 g Oral Daily  . pantoprazole  80 mg Oral Daily   Continuous Infusions: . sodium chloride 0.9 % 1,000 mL with potassium chloride 10 mEq infusion Stopped (07/08/14 1900)   PRN Meds:.acetaminophen **OR** acetaminophen, alum & mag hydroxide-simeth, hydrALAZINE, HYDROcodone-acetaminophen, HYDROcodone-acetaminophen, meclizine, menthol-cetylpyridinium **OR** phenol, methocarbamol, metoCLOPramide **OR** metoCLOPramide (REGLAN) injection, morphine injection, morphine injection, ondansetron **OR** ondansetron (ZOFRAN) IV, polyethylene glycol, sodium chloride  Antibiotics    Anti-infectives  Start     Dose/Rate Route Frequency Ordered Stop   07/07/14 2000  clindamycin (CLEOCIN) IVPB 600 mg     600 mg100 mL/hr over 30 Minutes Intravenous Every 6 hours 07/07/14 1556 07/08/14 0316      Subjective:   Megan Olsen seen and examined today.  Patient states she is doing well this morning. She does have some minor pain however has not been taking any medication for this. Patient is secured to start therapy. Patient is a shortness of breath, chest pain, abdominal pain.  Objective:   Filed Vitals:   07/08/14 1408 07/08/14 1600 07/08/14 2020 07/09/14 0627    BP: 142/61  140/54 155/57  Pulse: 60  61 75  Temp: 98 F (36.7 C)  97.7 F (36.5 C) 97.7 F (36.5 C)  TempSrc: Oral  Oral Oral  Resp: 18 18 19 18   Height:      Weight:      SpO2: 97% 97% 97% 93%    Wt Readings from Last 3 Encounters:  07/06/14 62.596 kg (138 lb)  06/12/14 62.143 kg (137 lb)  08/11/13 58.06 kg (128 lb)     Intake/Output Summary (Last 24 hours) at 07/09/14 1146 Last data filed at 07/09/14 1108  Gross per 24 hour  Intake 3267.08 ml  Output    700 ml  Net 2567.08 ml    Exam  General: Well developed, well nourished, NAD, appears stated age  38: NCAT,mucous membranes moist.   Cardiovascular: S1 S2 auscultated, irregular rhythm, soft murmur  Respiratory: Clear to auscultation bilaterally with equal chest rise  Abdomen: Soft, nontender, nondistended, + bowel sounds  Extremities: warm dry without cyanosis clubbing or edema.  Bandage on left hip-clean  Neuro: AAOx3, no focal deficits  Psych: Appropriate mood and affect, pleasant  Data Review   Micro Results Recent Results (from the past 240 hour(s))  Surgical pcr screen     Status: None   Collection Time: 07/07/14  5:16 AM  Result Value Ref Range Status   MRSA, PCR NEGATIVE NEGATIVE Final   Staphylococcus aureus NEGATIVE NEGATIVE Final    Comment:        The Xpert SA Assay (FDA approved for NASAL specimens in patients over 14 years of age), is one component of a comprehensive surveillance program.  Test performance has been validated by [employer] for patients greater than or equal to 35 year old. It is not intended to diagnose infection nor to guide or monitor treatment.     Radiology Reports Dg Lumbar Spine Complete  07/06/2014   CLINICAL DATA:  Fall, back pain, left hip pain  EXAM: LUMBAR SPINE - COMPLETE 4+ VIEW  COMPARISON:  05/02/2012  FINDINGS: Four views of lumbar spine submitted. There is diffuse osteopenia. There is disc space flattening with vacuum disc phenomenon at  L4-L5 level. Stable mild compression deformity upper endplate of L1 vertebral body of indeterminate age.  IMPRESSION: No acute fracture or subluxation. Disc space flattening at L4-L5 level. Stable mild compression deformity upper endplate of L1 vertebral body of indeterminate age.   Electronically Signed   By: Lahoma Crocker M.D.   On: 07/06/2014 14:31   Dg Hip Complete Left  07/07/2014   CLINICAL DATA:  Left hip fracture status post ORIF.  EXAM: DG C-ARM 1-60 MIN - NRPT [facility]; LEFT HIP - COMPLETE 2+ VIEW  COMPARISON:  07/06/2014  FINDINGS: Postsurgical hardware compatible with ORIF left femoral neck fracture. Hardware demonstrated on multiple fluoroscopic images.  IMPRESSION: Status post ORIF  left femoral neck fracture.   Electronically Signed   By: Lovey Newcomer M.D.   On: 07/07/2014 14:18   Dg Hip Complete Left  07/06/2014   CLINICAL DATA:  Fall, left hip pain  EXAM: LEFT HIP - COMPLETE 2+ VIEW  COMPARISON:  None.  FINDINGS: Three views of the left hip submitted. There is mild displaced fracture of left femoral neck. Degenerative changes right hip joint. Diffuse osteopenia.  IMPRESSION: Mild displaced fracture of left femoral neck.  Diffuse osteopenia.   Electronically Signed   By: Lahoma Crocker M.D.   On: 07/06/2014 14:25   Dg C-arm 1-60 Min-no Report  07/07/2014   CLINICAL DATA:  Left hip fracture status post ORIF.  EXAM: DG C-ARM 1-60 MIN - NRPT [facility]; LEFT HIP - COMPLETE 2+ VIEW  COMPARISON:  07/06/2014  FINDINGS: Postsurgical hardware compatible with ORIF left femoral neck fracture. Hardware demonstrated on multiple fluoroscopic images.  IMPRESSION: Status post ORIF left femoral neck fracture.   Electronically Signed   By: Lovey Newcomer M.D.   On: 07/07/2014 14:18    CBC  Recent Labs Lab 07/06/14 1430 07/08/14 0530 07/09/14 0503  WBC 9.2 7.3 6.7  HGB 13.0 11.2* 10.9*  HCT 40.9 34.2* 33.8*  PLT 171 110* 122*  MCV 95.6 94.7 94.9  MCH 30.4 31.0 30.6  MCHC 31.8 32.7 32.2  RDW 13.6 13.5 13.7    LYMPHSABS 0.7  --   --   MONOABS 0.6  --   --   EOSABS 0.1  --   --   BASOSABS 0.0  --   --     Chemistries   Recent Labs Lab 07/06/14 1430 07/08/14 0530  NA 138 140  K 4.1 4.4  CL 102 102  CO2 23 27  GLUCOSE 87 109*  BUN 16 10  CREATININE 0.66 0.81  CALCIUM 9.3 8.3*   ------------------------------------------------------------------------------------------------------------------ estimated creatinine clearance is 44.6 mL/min (by C-G formula based on Cr of 0.81). ------------------------------------------------------------------------------------------------------------------ No results for input(s): HGBA1C in the last 72 hours. ------------------------------------------------------------------------------------------------------------------ No results for input(s): CHOL, HDL, LDLCALC, TRIG, CHOLHDL, LDLDIRECT in the last 72 hours. ------------------------------------------------------------------------------------------------------------------ No results for input(s): TSH, T4TOTAL, T3FREE, THYROIDAB in the last 72 hours.  Invalid input(s): FREET3 ------------------------------------------------------------------------------------------------------------------ No results for input(s): VITAMINB12, FOLATE, FERRITIN, TIBC, IRON, RETICCTPCT in the last 72 hours.  Coagulation profile No results for input(s): INR, PROTIME in the last 168 hours.  No results for input(s): DDIMER in the last 72 hours.  Cardiac Enzymes No results for input(s): CKMB, TROPONINI, MYOGLOBIN in the last 168 hours.  Invalid input(s): CK ------------------------------------------------------------------------------------------------------------------ Invalid input(s): POCBNP    Joelee Snoke D.O. on 07/09/2014 at 11:46 AM  Between 7am to 7pm - Pager - [phone]  After 7pm go to [email or web address] - password [email or web address]  And look for the night coverage person covering for me after hours  [facility] Office  [phone]

## 2014-07-09 NOTE — Clinical Social Work Psychosocial (Signed)
Clinical Social Work Department BRIEF PSYCHOSOCIAL ASSESSMENT 07/09/2014  Patient:  [name], [name]     Account Number:  [id number]     Admit date:  07/06/2014  Clinical Social Worker:  Daiva Huge  Date/Time:  07/09/2014 02:31 PM  Referred by:  Physician  Date Referred:  07/07/2014 Referred for  SNF Placement   Other Referral:   Interview type:  Patient Other interview type:    PSYCHOSOCIAL DATA Living Status:  ALONE Admitted from facility:   Level of care:   Primary support name:  nephew, neighbors Primary support relationship to patient:  FAMILY Degree of support available:   minimal    CURRENT CONCERNS Current Concerns  Post-Acute Placement   Other Concerns:    SOCIAL WORK ASSESSMENT / PLAN Patient requesting SNF placement at d.c- she states she lives alone and is needing SNF for some rehab (s/p hip fx).  She is interested in a SNF near home and understands we will have to seek a bed where her [facility] insurance is covered.   Assessment/plan status:  Other - See comment Other assessment/ plan:   FL2 and PASARR for SNF   Information/referral to community resources:    PATIENT'S/FAMILY'S RESPONSE TO PLAN OF CARE: Patient agreeable to this plan- CSW will seek offers with SNF's accepting her insurance and advise.       Eduard Clos, MSW, [place]

## 2014-07-09 NOTE — Progress Notes (Signed)
Physical Therapy Treatment Patient Details Name: Megan Olsen MRN: [id number] DOB: [birth year] Today's Date: 07/09/2014    History of Present Illness s/p ORIF  L hip; PMHx: afib    PT Comments    POD # 2 am session.  Pt OOB in recliner.  Assisted with amb a short distance to BR at [facility].  Pt was only able to amb back to recliner due to increased c/o fatigue.  Positioned in recliner and then performed L LE TE's.  Follow Up Recommendations  SNF     Equipment Recommendations       Recommendations for Other Services       Precautions / Restrictions Precautions Precautions: Fall Restrictions Weight Bearing Restrictions: Yes LLE Weight Bearing: Partial weight bearing LLE Partial Weight Bearing Percentage or Pounds: 50%    Mobility  Bed Mobility               General bed mobility comments: pt in chair  Transfers Overall transfer level: Needs assistance Equipment used: Rolling walker (2 wheeled) Transfers: Sit to/from Stand Sit to Stand: Min guard         General transfer comment: 25% VC's on safety with turns while maintaining 50% WBing  Ambulation/Gait Ambulation/Gait assistance: Min assist Ambulation Distance (Feet): 22 Feet (11 feet x 2 ) Assistive device: Rolling walker (2 wheeled) Gait Pattern/deviations: Step-to pattern;Decreased stance time - left Gait velocity: decreased   General Gait Details: pt required increased time and VC's for safety with turns.  Limited amb distance due to increased c/o fatigue.    Stairs            Wheelchair Mobility    Modified Rankin (Stroke Patients Only)       Balance                                    Cognition                            Exercises  LE TE's 10 reps ankle pumps 10 reps knee presses 10 reps heel slides 10 reps SAQ's 10 reps ABD Followed by ICE     General Comments        Pertinent Vitals/Pain Pain Assessment: 0-10 Pain Score: 2  Pain Location: L  hip Pain Descriptors / Indicators: Aching Pain Intervention(s): Monitored during session;Premedicated before session;Repositioned;Ice applied    Home Living                      Prior Function            PT Goals (current goals can now be found in the care plan section) Progress towards PT goals: Progressing toward goals    Frequency  Min 3X/week    PT Plan      Co-evaluation             End of Session Equipment Utilized During Treatment: Gait belt Activity Tolerance: Patient tolerated treatment well Patient left: in chair;with call bell/phone within reach     Time: 1022-1050 PT Time Calculation (min) (ACUTE ONLY): 28 min  Charges:  $Gait Training: 8-22 mins $Therapeutic Exercise: 8-22 mins                    G Codes:      Rica Koyanagi  PTA [facility]  Acute  Rehab Pager  [phone]  

## 2014-07-10 MED ORDER — DSS 100 MG PO CAPS: 100.0000 mg | ORAL_CAPSULE | Freq: Two times a day (BID) | ORAL | Status: DC

## 2014-07-10 MED ORDER — ALUM & MAG HYDROXIDE-SIMETH 200-200-20 MG/5ML PO SUSP: 30.0000 mL | ORAL | Status: DC | PRN

## 2014-07-10 NOTE — Discharge Instructions (Signed)
Hip Fracture °A hip fracture is a fracture of the upper part of your thigh bone (femur).  °CAUSES °A hip fracture is caused by a direct blow to the side of your hip. This is usually the result of a fall but can occur in other circumstances, such as an automobile accident. °RISK FACTORS °There is an increased risk of hip fractures in people with: °· An unsteady walking pattern (gait) and those with conditions that contribute to poor balance, such as Parkinson's disease or dementia. °· Osteopenia and osteoporosis. °· Cancer that spreads to the leg bones. °· Certain metabolic diseases. °SYMPTOMS  °Symptoms of hip fracture include: °· Pain over the injured hip. °· Inability to put weight on the leg in which the fracture occurred (although, some patients are able to walk after a hip fracture). °· Toes and foot of the affected leg point outward when you lie down. °DIAGNOSIS °A physical exam can determine if a hip fracture is likely to have occurred. X-ray exams are needed to confirm the fracture and to look for other injuries. The X-ray exam can help to determine the type of hip fracture. Rarely, the fracture is not visible on an X-ray image and a CT scan or MRI will have to be done. °TREATMENT  °The treatment for a fracture is usually surgery. This means using a screw, nail, or rod to hold the bones in place.  °HOME CARE INSTRUCTIONS °Take all medicines as directed by your health care provider. °SEEK MEDICAL CARE IF: °Pain continues, even after taking pain medicine. °MAKE SURE YOU: °· Understand these instructions.   °· Will watch your condition. °· Will get help right away if you are not doing well or get worse. °Document Released: 07/06/2005 Document Revised: 07/11/2013 Document Reviewed: 02/15/2013 °Exit[employer]® Patient Information ©2015 [employer]. This information is not intended to replace advice given to you by your health care provider. Make sure you discuss any questions you have with your health care  provider. ° °

## 2014-07-10 NOTE — Discharge Summary (Signed)
Physician Discharge Summary  TERYL GUBLER HRC:[id number] DOB: [birth year] DOA: 07/06/2014  PCP: Nyoka Cowden, MD  Admit date: 07/06/2014 Discharge date: 07/10/2014  Time spent: 45 minutes  Recommendations for Outpatient Follow-up:  Patient will be discharged to [facility]. She is to continue physical therapy as well as occupational therapy is recommended by the facility. Patient should follow-up with her primary care physician within one week of discharge. Patient also needed a follow-up with Dr. Alvan Dame within 2 weeks of discharge. Patient should continue her medications as prescribed. She should follow a regular diet.  Discharge Diagnoses:  Left hip fracture Atrial fibrillation status post catheter ablation Essential hypertension GERD Anxiety History of CVA  Discharge Condition: Stable  Diet recommendation: Regular  Filed Weights   07/06/14 2020  Weight: 62.596 kg (138 lb)    History of present illness:  on 07/06/2014 by Dr. Gevena Barre Megan Olsen is a 80 y.o. female with history of atrial fibrillation status post catheter ablation, osteoporosis, hypertension and CVA who is normally quite functional and today suffered a mechanical fall when she tripped on her carpet landing on her left side. She was unable to stand and she had severe pain in her left hip and she was able to call for a neighbor who summoned paramedics. Patient was brought into the emergency room and x-rays done noted a left displaced femoral neck fracture. Lab work was done which was unrevealing. EKG noted what looked to be atrial fibrillation rate controlled with a right bundle branch block which has been seen previously. Hospitalists were called for further evaluation and admission and orthopedics were notified.  Hospital Course:  Left hip fracture -Secondary to ground-level fall -Orthopedics consulted and appreciated -s/p left canulated hip pinning -Continue pain  control as needed -PT and OT consulted, recommended SNF -Social work consulted for SNF, patient will be discharged today to [facility]. -Patient will need to follow-up with orthopedics, Dr. Alvan Dame with 2 weeks of discharge  Atrial fibrillation status post catheter ablation -Patient currently rate controlled however not rhythm controlled -Patient states she was on anticoagulation one point time however this was discontinued -Patient states she was taking metoprolol for rate regulation however her medications were changed -Continue bisoprolol   Essential hypertension -Continue Ziac  GERD -Continue PPI  Anxiety -Continue ativan  History of CVA -Patient has no residual deficits -MRI in 2003 showed some cortical infarcts -Continue aspirin  Normocytic anemia -Hemoglobin appears to be at baseline  CODE STATUS: DO NOT RESUSCITATE  Procedures: Left cannulated hip pinning  Consultations: Orthopedic surgery  Discharge Exam: Filed Vitals:   07/10/14 0526  BP: 125/49  Pulse: 60  Temp: 97.7 F (36.5 C)  Resp: 16     General: Well developed, well nourished, NAD, appears stated age  HEENT: NCAT,  mucous membranes moist.  Cardiovascular: S1 S2 auscultated, irregular rhythm, soft murmur  Respiratory: Clear to auscultation bilaterally with equal chest rise  Abdomen: Soft, nontender, nondistended, + bowel sounds  Extremities: warm dry without cyanosis clubbing or edema, left hip bandage clean  Neuro: AAOx3, no focal deficits  Psych: Normal affect and demeanor with intact judgement and insight  Discharge Instructions      Discharge Instructions    Discharge instructions    Complete by:  As directed   Patient will be discharged to [facility] nursing facility. She is to continue physical therapy as well as occupational therapy is recommended by the facility. Patient should follow-up with her primary care physician within one  week of discharge. Patient  also needed a follow-up with Dr. Alvan Dame within 2 weeks of discharge. Patient should continue her medications as prescribed. She should follow a regular diet.     Partial weight bearing    Complete by:  As directed   50%            Medication List    STOP taking these medications        traMADol 50 MG tablet  Commonly known as:  ULTRAM      TAKE these medications        alum & mag hydroxide-simeth 200-200-20 MG/5ML suspension  Commonly known as:  MAALOX/MYLANTA  Take 30 mLs by mouth every 4 (four) hours as needed for indigestion.     aspirin EC 325 MG tablet  Take 1 tablet (325 mg total) by mouth 2 (two) times daily. Take for 4 weeks     bisoprolol-hydrochlorothiazide 5-6.25 MG per tablet  Commonly known as:  ZIAC  Take 1 tablet by mouth daily.     cholecalciferol 1000 UNITS tablet  Commonly known as:  VITAMIN D  Take 1,000 Units by mouth daily with breakfast.     dexlansoprazole 60 MG capsule  Commonly known as:  DEXILANT  Take 1 capsule (60 mg total) by mouth daily.     DSS 100 MG Caps  Take 100 mg by mouth 2 (two) times daily.     estradiol 1 MG tablet  Commonly known as:  ESTRACE  TAKE 1 TABLET (1 MG TOTAL) BY MOUTH DAILY.     HYDROcodone-acetaminophen 5-325 MG per tablet  Commonly known as:  NORCO  Take 1-2 tablets by mouth every 6 (six) hours as needed.     ICAPS Caps  Take 1 capsule by mouth daily with breakfast.     HAIR/SKIN/NAILS PO  Take 1 capsule by mouth daily with breakfast.     ipratropium 0.03 % nasal spray  Commonly known as:  ATROVENT  Place 2 sprays into both nostrils every 12 (twelve) hours.     LORazepam 1 MG tablet  Commonly known as:  ATIVAN  TAKE 1/2 TABLET BY MOUTH IN AM, THEN 1 TABLET AT BEDTIME     LUTEIN-ZEAXANTHIN PO  Take 1 tablet by mouth daily with breakfast.     meclizine 25 MG tablet  Commonly known as:  ANTIVERT  Take 1 tablet (25 mg total) by mouth every 6 (six) hours as needed for dizziness.     meloxicam 15 MG  tablet  Commonly known as:  MOBIC  Take 1 tablet (15 mg total) by mouth daily.     Omega 3 1000 MG Caps  Take 1 capsule by mouth daily with breakfast.     PRESCRIPTION MEDICATION  Got injection in left eye at eye dr's office     TYLENOL ARTHRITIS PAIN 650 MG CR tablet  Generic drug:  acetaminophen  Take 650 mg by mouth every 12 (twelve) hours as needed for pain.       Allergies  Allergen Reactions  . Propoxyphene N-Acetaminophen Nausea Only  . Celecoxib Itching and Rash  . Fexofenadine Rash  . Penicillins Hives and Rash   Follow-up Information    Follow up with Mauri Pole, MD In 2 weeks.   Specialty:  Orthopedic Surgery   Why:  For wound check and X-rays   Contact information:   [address] [place] [address] [phone]       Follow up with Nyoka Cowden, MD.  Schedule an appointment as soon as possible for a visit in 2 weeks.   Specialty:  Internal Medicine   Why:  Hospital followup   Contact information:   [place] [address] [phone]        The results of significant diagnostics from this hospitalization (including imaging, microbiology, ancillary and laboratory) are listed below for reference.    Significant Diagnostic Studies: Dg Lumbar Spine Complete  07/06/2014   CLINICAL DATA:  Fall, back pain, left hip pain  EXAM: LUMBAR SPINE - COMPLETE 4+ VIEW  COMPARISON:  05/02/2012  FINDINGS: Four views of lumbar spine submitted. There is diffuse osteopenia. There is disc space flattening with vacuum disc phenomenon at L4-L5 level. Stable mild compression deformity upper endplate of L1 vertebral body of indeterminate age.  IMPRESSION: No acute fracture or subluxation. Disc space flattening at L4-L5 level. Stable mild compression deformity upper endplate of L1 vertebral body of indeterminate age.   Electronically Signed   By: Lahoma Crocker M.D.   On: 07/06/2014 14:31   Dg Hip Complete Left  07/07/2014    CLINICAL DATA:  Left hip fracture status post ORIF.  EXAM: DG C-ARM 1-60 MIN - NRPT [facility]; LEFT HIP - COMPLETE 2+ VIEW  COMPARISON:  07/06/2014  FINDINGS: Postsurgical hardware compatible with ORIF left femoral neck fracture. Hardware demonstrated on multiple fluoroscopic images.  IMPRESSION: Status post ORIF left femoral neck fracture.   Electronically Signed   By: Lovey Newcomer M.D.   On: 07/07/2014 14:18   Dg Hip Complete Left  07/06/2014   CLINICAL DATA:  Fall, left hip pain  EXAM: LEFT HIP - COMPLETE 2+ VIEW  COMPARISON:  None.  FINDINGS: Three views of the left hip submitted. There is mild displaced fracture of left femoral neck. Degenerative changes right hip joint. Diffuse osteopenia.  IMPRESSION: Mild displaced fracture of left femoral neck.  Diffuse osteopenia.   Electronically Signed   By: Lahoma Crocker M.D.   On: 07/06/2014 14:25   Dg C-arm 1-60 Min-no Report  07/07/2014   CLINICAL DATA:  Left hip fracture status post ORIF.  EXAM: DG C-ARM 1-60 MIN - NRPT [facility]; LEFT HIP - COMPLETE 2+ VIEW  COMPARISON:  07/06/2014  FINDINGS: Postsurgical hardware compatible with ORIF left femoral neck fracture. Hardware demonstrated on multiple fluoroscopic images.  IMPRESSION: Status post ORIF left femoral neck fracture.   Electronically Signed   By: Lovey Newcomer M.D.   On: 07/07/2014 14:18    Microbiology: Recent Results (from the past 240 hour(s))  Surgical pcr screen     Status: None   Collection Time: 07/07/14  5:16 AM  Result Value Ref Range Status   MRSA, PCR NEGATIVE NEGATIVE Final   Staphylococcus aureus NEGATIVE NEGATIVE Final    Comment:        The Xpert SA Assay (FDA approved for NASAL specimens in patients over 66 years of age), is one component of a comprehensive surveillance program.  Test performance has been validated by [employer] for patients greater than or equal to 45 year old. It is not intended to diagnose infection nor to guide or monitor treatment.      Labs: Basic  Metabolic Panel:  Recent Labs Lab 07/06/14 1430 07/08/14 0530  NA 138 140  K 4.1 4.4  CL 102 102  CO2 23 27  GLUCOSE 87 109*  BUN 16 10  CREATININE 0.66 0.81  CALCIUM 9.3 8.3*   Liver Function Tests: No results for input(s): AST, ALT,  ALKPHOS, BILITOT, PROT, ALBUMIN in the last 168 hours. No results for input(s): LIPASE, AMYLASE in the last 168 hours. No results for input(s): AMMONIA in the last 168 hours. CBC:  Recent Labs Lab 07/06/14 1430 07/08/14 0530 07/09/14 0503  WBC 9.2 7.3 6.7  NEUTROABS 7.8*  --   --   HGB 13.0 11.2* 10.9*  HCT 40.9 34.2* 33.8*  MCV 95.6 94.7 94.9  PLT 171 110* 122*   Cardiac Enzymes: No results for input(s): CKTOTAL, CKMB, CKMBINDEX, TROPONINI in the last 168 hours. BNP: BNP (last 3 results) No results for input(s): PROBNP in the last 8760 hours. CBG: No results for input(s): GLUCAP in the last 168 hours.     SignedCristal Ford  [facility] 07/10/2014, 10:16 AM

## 2014-07-10 NOTE — Clinical Social Work Note (Signed)
Patient for d/c today to SNF bed at [facility]. Patient pleased with location and agreeable to this plan- plan transfer via EMS. Eduard Clos, MSW, [place]

## 2014-07-10 NOTE — Progress Notes (Signed)
Patient ID: Megan Olsen, female   DOB: 12-07-1927, 80 y.o.   MRN: [id number] Subjective: 3 Days Post-Op Procedure(s) (LRB): CANNULATED HIP PINNING (Left)    Patient reports pain as mild.  Recognizes pain when not taking her pain meds as she tried to do without them some yesterday  Objective:   VITALS:   Filed Vitals:   07/10/14 0526  BP: 125/49  Pulse: 60  Temp: 97.7 F (36.5 C)  Resp: 16    Neurovascular intact Incision: dressing C/D/I  LABS  Recent Labs  07/08/14 0530 07/09/14 0503  HGB 11.2* 10.9*  HCT 34.2* 33.8*  WBC 7.3 6.7  PLT 110* 122*     Recent Labs  07/08/14 0530  NA 140  K 4.4  BUN 10  CREATININE 0.81  GLUCOSE 109*    No results for input(s): LABPT, INR in the last 72 hours.   Assessment/Plan: 3 Days Post-Op Procedure(s) (LRB): CANNULATED HIP PINNING (Left)   Up with therapy Discharge to SNF today most likely  RTC in 2 weeks PWB LLE

## 2014-07-10 NOTE — Clinical Social Work Note (Signed)
SNF bed offers provided to patient and she has accepted bed at [facility]. Awaiting final MD orders and insurance Josem Kaufmann is pending for dc possibly today. Will update MD- Eduard Clos, MSW, [place]

## 2014-07-17 ENCOUNTER — Other Ambulatory Visit: Payer: Self-pay | Admitting: *Deleted

## 2014-07-17 MED ORDER — DEXLANSOPRAZOLE 60 MG PO CPDR: 60.0000 mg | DELAYED_RELEASE_CAPSULE | Freq: Every day | ORAL | Status: DC

## 2014-07-23 DIAGNOSIS — I4891 Unspecified atrial fibrillation: Secondary | ICD-10-CM | POA: Diagnosis not present

## 2014-07-23 DIAGNOSIS — F419 Anxiety disorder, unspecified: Secondary | ICD-10-CM | POA: Diagnosis not present

## 2014-07-23 DIAGNOSIS — I10 Essential (primary) hypertension: Secondary | ICD-10-CM | POA: Diagnosis not present

## 2014-07-23 DIAGNOSIS — M810 Age-related osteoporosis without current pathological fracture: Secondary | ICD-10-CM | POA: Diagnosis not present

## 2014-07-23 DIAGNOSIS — S72002D Fracture of unspecified part of neck of left femur, subsequent encounter for closed fracture with routine healing: Secondary | ICD-10-CM | POA: Diagnosis not present

## 2014-07-26 DIAGNOSIS — S72002D Fracture of unspecified part of neck of left femur, subsequent encounter for closed fracture with routine healing: Secondary | ICD-10-CM | POA: Diagnosis not present

## 2014-07-26 DIAGNOSIS — I10 Essential (primary) hypertension: Secondary | ICD-10-CM | POA: Diagnosis not present

## 2014-07-26 DIAGNOSIS — M810 Age-related osteoporosis without current pathological fracture: Secondary | ICD-10-CM | POA: Diagnosis not present

## 2014-07-26 DIAGNOSIS — I4891 Unspecified atrial fibrillation: Secondary | ICD-10-CM | POA: Diagnosis not present

## 2014-07-26 DIAGNOSIS — F419 Anxiety disorder, unspecified: Secondary | ICD-10-CM | POA: Diagnosis not present

## 2014-07-27 ENCOUNTER — Ambulatory Visit (INDEPENDENT_AMBULATORY_CARE_PROVIDER_SITE_OTHER): Payer: Commercial Managed Care - HMO | Admitting: Internal Medicine

## 2014-07-27 ENCOUNTER — Encounter: Payer: Self-pay | Admitting: Internal Medicine

## 2014-07-27 VITALS — BP 138/80 | HR 58 | Temp 97.4°F | Resp 18 | Ht 64.0 in | Wt 131.0 lb

## 2014-07-27 DIAGNOSIS — S72002S Fracture of unspecified part of neck of left femur, sequela: Secondary | ICD-10-CM

## 2014-07-27 DIAGNOSIS — Z8673 Personal history of transient ischemic attack (TIA), and cerebral infarction without residual deficits: Secondary | ICD-10-CM

## 2014-07-27 DIAGNOSIS — I10 Essential (primary) hypertension: Secondary | ICD-10-CM

## 2014-07-27 DIAGNOSIS — I4891 Unspecified atrial fibrillation: Secondary | ICD-10-CM

## 2014-07-27 MED ORDER — LEVOTHYROXINE SODIUM 25 MCG PO CAPS: 1.0000 | ORAL_CAPSULE | Freq: Every day | ORAL | Status: DC

## 2014-07-27 NOTE — Patient Instructions (Addendum)
Limit your sodium (Salt) intake  Continue levothyroxine once daily

## 2014-07-27 NOTE — Progress Notes (Signed)
Pre visit review using our clinic review tool, if applicable. No additional management support is needed unless otherwise documented below in the visit note. 

## 2014-07-27 NOTE — Progress Notes (Signed)
Subjective:    Patient ID: Megan Olsen, female    DOB: October 11, 1927, 81 y.o.   MRN: [id number]  HPI  81 year old patient who has a history of paroxysmal atrial fibrillation, history of stroke without residual deficits, as well as osteoarthritis.  She was hospitalized recently for treatment of a left hip fracture and spent a couple weeks at [facility] for further rehabilitation.  She has been home for approximately 2 weeks and continues with home physical therapy 2 times per week.  She is immature with a walker and has had nice improvement.  In general doing quite well.  She has treated hypertension which has been stable.  She has completed DVD prophylaxis and is on aspirin therapy only at the present time.  Denies any lower extremity edema or pulmonary complaints While at [facility] was noted to have hypothyroidism and levothyroxine was prescribed.  Patient was encouraged to continue this medication  Hospital records reviewed  Admit date: 07/06/2014 Discharge date: 07/10/2014  Recommendations for Outpatient Follow-up:  Patient will be discharged to [facility]. She is to continue physical therapy as well as occupational therapy is recommended by the facility. Patient should follow-up with her primary care physician within one week of discharge. Patient also needed a follow-up with Dr. Alvan Dame within 2 weeks of discharge. Patient should continue her medications as prescribed. She should follow a regular diet.  Discharge Diagnoses:  Left hip fracture Atrial fibrillation status post catheter ablation Essential hypertension GERD Anxiety History of CVA   Past Medical History  Diagnosis Date  . GERD (gastroesophageal reflux disease)   . Depression   . Osteoporosis   . Atrial fibrillation     catheter ablation of SVT in 2002  . Osteoarthritis   . CVA (cerebrovascular accident) 2003    left brain  . Iron deficiency anemia   . Right bbb/left ant fasc block      History   Social History  . Marital Status: Single    Spouse Name: N/A    Number of Children: N/A  . Years of Education: N/A   Occupational History  . Not on file.   Social History Main Topics  . Smoking status: Never Smoker   . Smokeless tobacco: Never Used  . Alcohol Use: No  . Drug Use: No  . Sexual Activity: No   Other Topics Concern  . Not on file   Social History Narrative    Past Surgical History  Procedure Laterality Date  . Cholecystectomy    . Abdominal hysterectomy    . Total abdominal hysterectomy    . Right eye surgery  2008    dr Zadie Rhine.  Vitrectomy and removal of tissue.   . Colonoscopy    . Laparoscopic ovarian cystectomy    . Hip pinning,cannulated Left 07/07/2014    Procedure: CANNULATED HIP PINNING;  Surgeon: Mauri Pole, MD;  Location: [facility] ORS;  Service: Orthopedics;  Laterality: Left;    No family history on file.  Allergies  Allergen Reactions  . Propoxyphene N-Acetaminophen Nausea Only  . Celecoxib Itching and Rash  . Fexofenadine Rash  . Penicillins Hives and Rash    Current Outpatient Prescriptions on File Prior to Visit  Medication Sig Dispense Refill  . acetaminophen (TYLENOL ARTHRITIS PAIN) 650 MG CR tablet Take 650 mg by mouth every 12 (twelve) hours as needed for pain.    Marland Kitchen alum & mag hydroxide-simeth (MAALOX/MYLANTA) 200-200-20 MG/5ML suspension Take 30 mLs by mouth every 4 (four) hours as needed  for indigestion. 355 mL 0  . aspirin EC 325 MG tablet Take 1 tablet (325 mg total) by mouth 2 (two) times daily. Take for 4 weeks 60 tablet 0  . bisoprolol-hydrochlorothiazide (ZIAC) 5-6.25 MG per tablet Take 1 tablet by mouth daily. (Patient taking differently: Take 1 tablet by mouth daily with breakfast. ) 90 tablet 3  . cholecalciferol (VITAMIN D) 1000 UNITS tablet Take 1,000 Units by mouth daily with breakfast.    . dexlansoprazole (DEXILANT) 60 MG capsule Take 1 capsule (60 mg total) by mouth daily. 90 capsule 1  . estradiol  (ESTRACE) 1 MG tablet TAKE 1 TABLET (1 MG TOTAL) BY MOUTH DAILY. (Patient taking differently: Take 1 mg by mouth at bedtime. ) 90 tablet 0  . HYDROcodone-acetaminophen (NORCO) 5-325 MG per tablet Take 1-2 tablets by mouth every 6 (six) hours as needed. 90 tablet 0  . ipratropium (ATROVENT) 0.03 % nasal spray Place 2 sprays into both nostrils every 12 (twelve) hours. 30 mL 12  . LORazepam (ATIVAN) 1 MG tablet TAKE 1/2 TABLET BY MOUTH IN AM, THEN 1 TABLET AT BEDTIME 30 tablet 0  . LUTEIN-ZEAXANTHIN PO Take 1 tablet by mouth daily with breakfast.    . meclizine (ANTIVERT) 25 MG tablet Take 1 tablet (25 mg total) by mouth every 6 (six) hours as needed for dizziness. 60 tablet 2  . meloxicam (MOBIC) 15 MG tablet Take 1 tablet (15 mg total) by mouth daily. (Patient taking differently: Take 15 mg by mouth daily with breakfast. ) 90 tablet 3  . Multiple Vitamins-Minerals (HAIR/SKIN/NAILS PO) Take 1 capsule by mouth daily with breakfast.    . Multiple Vitamins-Minerals (ICAPS) CAPS Take 1 capsule by mouth daily with breakfast.    . Omega 3 1000 MG CAPS Take 1 capsule by mouth daily with breakfast.    . PRESCRIPTION MEDICATION Got injection in left eye at eye dr's office     Current Facility-Administered Medications on File Prior to Visit  Medication Dose Route Frequency Provider Last Rate Last Dose  . methylPREDNISolone acetate (DEPO-MEDROL) injection 80 mg  80 mg Intramuscular Q1400 Ricard Dillon, MD        BP 138/80 mmHg  Pulse 58  Temp(Src) 97.4 F (36.3 C) (Oral)  Resp 18  Ht 5\' 4"  (1.626 m)  Wt 131 lb (59.421 kg)  BMI 22.47 kg/m2  SpO2 96%     Review of Systems  Constitutional: Negative.   HENT: Negative for congestion, dental problem, hearing loss, rhinorrhea, sinus pressure, sore throat and tinnitus.   Eyes: Negative for pain, discharge and visual disturbance.  Respiratory: Negative for cough and shortness of breath.   Cardiovascular: Negative for chest pain, palpitations and leg  swelling.  Gastrointestinal: Negative for nausea, vomiting, abdominal pain, diarrhea, constipation, blood in stool and abdominal distention.  Genitourinary: Negative for dysuria, urgency, frequency, hematuria, flank pain, vaginal bleeding, vaginal discharge, difficulty urinating, vaginal pain and pelvic pain.  Musculoskeletal: Positive for back pain, arthralgias and gait problem. Negative for joint swelling.  Skin: Negative for rash.  Neurological: Negative for dizziness, syncope, speech difficulty, weakness, numbness and headaches.  Hematological: Negative for adenopathy.  Psychiatric/Behavioral: Negative for behavioral problems, dysphoric mood and agitation. The patient is not nervous/anxious.        Objective:   Physical Exam  Constitutional: She is oriented to person, place, and time. She appears well-developed and well-nourished.  HENT:  Head: Normocephalic.  Right Ear: External ear normal.  Left Ear: External ear normal.  Mouth/Throat: Oropharynx  is clear and moist.  Eyes: Conjunctivae and EOM are normal. Pupils are equal, round, and reactive to light.  Neck: Normal range of motion. Neck supple. No thyromegaly present.  Cardiovascular: Normal rate, regular rhythm, normal heart sounds and intact distal pulses.   Pulmonary/Chest: Effort normal and breath sounds normal.  Abdominal: Soft. Bowel sounds are normal. She exhibits no mass. There is no tenderness.  Musculoskeletal: She exhibits no edema.  Ambulatory with a walker  Lymphadenopathy:    She has no cervical adenopathy.  Neurological: She is alert and oriented to person, place, and time.  Skin: Skin is warm and dry. No rash noted.  Psychiatric: She has a normal mood and affect. Her behavior is normal.          Assessment & Plan:   Hypertension, well-controlled Status post left hip fracture Osteoarthritis, stable Recent diagnosis of mild hypothyroidism.  Patient encouraged to continue on supplement.  We'll recheck a  TSH in 6 weeks  We'll continue home physical therapy Low-salt diet recommended No other change in therapy

## 2014-07-30 ENCOUNTER — Telehealth: Payer: Self-pay | Admitting: Internal Medicine

## 2014-07-30 NOTE — Telephone Encounter (Signed)
emmi mailed  °

## 2014-07-31 DIAGNOSIS — F419 Anxiety disorder, unspecified: Secondary | ICD-10-CM | POA: Diagnosis not present

## 2014-07-31 DIAGNOSIS — S72002D Fracture of unspecified part of neck of left femur, subsequent encounter for closed fracture with routine healing: Secondary | ICD-10-CM | POA: Diagnosis not present

## 2014-07-31 DIAGNOSIS — M810 Age-related osteoporosis without current pathological fracture: Secondary | ICD-10-CM | POA: Diagnosis not present

## 2014-07-31 DIAGNOSIS — I10 Essential (primary) hypertension: Secondary | ICD-10-CM | POA: Diagnosis not present

## 2014-07-31 DIAGNOSIS — I4891 Unspecified atrial fibrillation: Secondary | ICD-10-CM | POA: Diagnosis not present

## 2014-08-02 DIAGNOSIS — S72002D Fracture of unspecified part of neck of left femur, subsequent encounter for closed fracture with routine healing: Secondary | ICD-10-CM | POA: Diagnosis not present

## 2014-08-02 DIAGNOSIS — I4891 Unspecified atrial fibrillation: Secondary | ICD-10-CM | POA: Diagnosis not present

## 2014-08-02 DIAGNOSIS — I10 Essential (primary) hypertension: Secondary | ICD-10-CM | POA: Diagnosis not present

## 2014-08-02 DIAGNOSIS — M810 Age-related osteoporosis without current pathological fracture: Secondary | ICD-10-CM | POA: Diagnosis not present

## 2014-08-02 DIAGNOSIS — F419 Anxiety disorder, unspecified: Secondary | ICD-10-CM | POA: Diagnosis not present

## 2014-08-03 ENCOUNTER — Other Ambulatory Visit: Payer: Self-pay | Admitting: *Deleted

## 2014-08-03 MED ORDER — MELOXICAM 15 MG PO TABS: 15.0000 mg | ORAL_TABLET | Freq: Every day | ORAL | Status: DC

## 2014-08-07 DIAGNOSIS — F419 Anxiety disorder, unspecified: Secondary | ICD-10-CM | POA: Diagnosis not present

## 2014-08-07 DIAGNOSIS — I4891 Unspecified atrial fibrillation: Secondary | ICD-10-CM | POA: Diagnosis not present

## 2014-08-07 DIAGNOSIS — I10 Essential (primary) hypertension: Secondary | ICD-10-CM | POA: Diagnosis not present

## 2014-08-07 DIAGNOSIS — M810 Age-related osteoporosis without current pathological fracture: Secondary | ICD-10-CM | POA: Diagnosis not present

## 2014-08-07 DIAGNOSIS — S72002D Fracture of unspecified part of neck of left femur, subsequent encounter for closed fracture with routine healing: Secondary | ICD-10-CM | POA: Diagnosis not present

## 2014-08-15 DIAGNOSIS — S72002D Fracture of unspecified part of neck of left femur, subsequent encounter for closed fracture with routine healing: Secondary | ICD-10-CM | POA: Diagnosis not present

## 2014-08-16 ENCOUNTER — Ambulatory Visit: Payer: Medicare HMO | Admitting: Family Medicine

## 2014-08-16 DIAGNOSIS — F419 Anxiety disorder, unspecified: Secondary | ICD-10-CM | POA: Diagnosis not present

## 2014-08-16 DIAGNOSIS — I10 Essential (primary) hypertension: Secondary | ICD-10-CM | POA: Diagnosis not present

## 2014-08-16 DIAGNOSIS — I4891 Unspecified atrial fibrillation: Secondary | ICD-10-CM | POA: Diagnosis not present

## 2014-08-16 DIAGNOSIS — M810 Age-related osteoporosis without current pathological fracture: Secondary | ICD-10-CM | POA: Diagnosis not present

## 2014-08-16 DIAGNOSIS — S72002D Fracture of unspecified part of neck of left femur, subsequent encounter for closed fracture with routine healing: Secondary | ICD-10-CM | POA: Diagnosis not present

## 2014-08-23 DIAGNOSIS — I4891 Unspecified atrial fibrillation: Secondary | ICD-10-CM | POA: Diagnosis not present

## 2014-08-23 DIAGNOSIS — M810 Age-related osteoporosis without current pathological fracture: Secondary | ICD-10-CM | POA: Diagnosis not present

## 2014-08-23 DIAGNOSIS — S72002D Fracture of unspecified part of neck of left femur, subsequent encounter for closed fracture with routine healing: Secondary | ICD-10-CM | POA: Diagnosis not present

## 2014-08-23 DIAGNOSIS — I10 Essential (primary) hypertension: Secondary | ICD-10-CM | POA: Diagnosis not present

## 2014-08-23 DIAGNOSIS — F419 Anxiety disorder, unspecified: Secondary | ICD-10-CM | POA: Diagnosis not present

## 2014-09-07 ENCOUNTER — Encounter: Payer: Self-pay | Admitting: Internal Medicine

## 2014-09-07 ENCOUNTER — Ambulatory Visit (INDEPENDENT_AMBULATORY_CARE_PROVIDER_SITE_OTHER): Payer: Commercial Managed Care - HMO | Admitting: Internal Medicine

## 2014-09-07 VITALS — BP 140/80 | HR 68 | Temp 98.6°F | Wt 130.0 lb

## 2014-09-07 DIAGNOSIS — I480 Paroxysmal atrial fibrillation: Secondary | ICD-10-CM

## 2014-09-07 DIAGNOSIS — I10 Essential (primary) hypertension: Secondary | ICD-10-CM

## 2014-09-07 DIAGNOSIS — E039 Hypothyroidism, unspecified: Secondary | ICD-10-CM

## 2014-09-07 HISTORY — DX: Hypothyroidism, unspecified: E03.9

## 2014-09-07 NOTE — Patient Instructions (Signed)
Limit your sodium (Salt) intake    It is important that you exercise regularly, at least 20 minutes 3 to 4 times per week.  If you develop chest pain or shortness of breath seek  medical attention.  Return in 6 months for follow-up  

## 2014-09-07 NOTE — Progress Notes (Signed)
Subjective:    Patient ID: Megan Olsen, female    DOB: May 31, 1928, 81 y.o.   MRN: [id number]  HPI  81 year old patient who is seen today in follow-up.  She has had a recent hip fracture and has done quite well.  6 weeks ago.  She was able to walk with a 4. walker and now does well with a cane.  She has the regained her independence and now is driving.  She was noted have elevation of TSH on 2 occasion and now is on supplemental levothyroxine.  She continues to do well and feel well.  No concerns or complaints Past Medical History  Diagnosis Date  . GERD (gastroesophageal reflux disease)   . Depression   . Osteoporosis   . Atrial fibrillation     catheter ablation of SVT in 2002  . Osteoarthritis   . CVA (cerebrovascular accident) 2003    left brain  . Iron deficiency anemia   . Right bbb/left ant fasc block     History   Social History  . Marital Status: Single    Spouse Name: N/A  . Number of Children: N/A  . Years of Education: N/A   Occupational History  . Not on file.   Social History Main Topics  . Smoking status: Never Smoker   . Smokeless tobacco: Never Used  . Alcohol Use: No  . Drug Use: No  . Sexual Activity: No   Other Topics Concern  . Not on file   Social History Narrative    Past Surgical History  Procedure Laterality Date  . Cholecystectomy    . Abdominal hysterectomy    . Total abdominal hysterectomy    . Right eye surgery  2008    dr Zadie Rhine.  Vitrectomy and removal of tissue.   . Colonoscopy    . Laparoscopic ovarian cystectomy    . Hip pinning,cannulated Left 07/07/2014    Procedure: CANNULATED HIP PINNING;  Surgeon: Mauri Pole, MD;  Location: [facility];  Service: Orthopedics;  Laterality: Left;    No family history on file.  Allergies  Allergen Reactions  . Propoxyphene N-Acetaminophen Nausea Only  . Celecoxib Itching and Rash  . Fexofenadine Rash  . Penicillins Hives and Rash    Current Outpatient Prescriptions on File  Prior to Visit  Medication Sig Dispense Refill  . acetaminophen (TYLENOL ARTHRITIS PAIN) 650 MG CR tablet Take 650 mg by mouth every 12 (twelve) hours as needed for pain.    Marland Kitchen alum & mag hydroxide-simeth (MAALOX/MYLANTA) 200-200-20 MG/5ML suspension Take 30 mLs by mouth every 4 (four) hours as needed for indigestion. 355 mL 0  . aspirin EC 325 MG tablet Take 1 tablet (325 mg total) by mouth 2 (two) times daily. Take for 4 weeks 60 tablet 0  . bisoprolol-hydrochlorothiazide (ZIAC) 5-6.25 MG per tablet Take 1 tablet by mouth daily. (Patient taking differently: Take 1 tablet by mouth daily with breakfast. ) 90 tablet 3  . cholecalciferol (VITAMIN D) 1000 UNITS tablet Take 1,000 Units by mouth daily with breakfast.    . dexlansoprazole (DEXILANT) 60 MG capsule Take 1 capsule (60 mg total) by mouth daily. 90 capsule 1  . estradiol (ESTRACE) 1 MG tablet TAKE 1 TABLET (1 MG TOTAL) BY MOUTH DAILY. (Patient taking differently: Take 1 mg by mouth at bedtime. ) 90 tablet 0  . HYDROcodone-acetaminophen (NORCO) 5-325 MG per tablet Take 1-2 tablets by mouth every 6 (six) hours as needed. 90 tablet 0  . ipratropium (  ATROVENT) 0.03 % nasal spray Place 2 sprays into both nostrils every 12 (twelve) hours. 30 mL 12  . Levothyroxine Sodium 25 MCG CAPS Take 1 capsule (25 mcg total) by mouth daily before breakfast. 90 capsule 0  . LORazepam (ATIVAN) 1 MG tablet TAKE 1/2 TABLET BY MOUTH IN AM, THEN 1 TABLET AT BEDTIME 30 tablet 0  . LUTEIN-ZEAXANTHIN PO Take 1 tablet by mouth daily with breakfast.    . meclizine (ANTIVERT) 25 MG tablet Take 1 tablet (25 mg total) by mouth every 6 (six) hours as needed for dizziness. 60 tablet 2  . meloxicam (MOBIC) 15 MG tablet Take 1 tablet (15 mg total) by mouth daily. 90 tablet 3  . Multiple Vitamins-Minerals (HAIR/SKIN/NAILS PO) Take 1 capsule by mouth daily with breakfast.    . Multiple Vitamins-Minerals (ICAPS) CAPS Take 1 capsule by mouth daily with breakfast.    . Omega 3 1000 MG  CAPS Take 1 capsule by mouth daily with breakfast.    . PRESCRIPTION MEDICATION Got injection in left eye at eye dr's office     Current Facility-Administered Medications on File Prior to Visit  Medication Dose Route Frequency Provider Last Rate Last Dose  . methylPREDNISolone acetate (DEPO-MEDROL) injection 80 mg  80 mg Intramuscular Q1400 Ricard Dillon, MD        BP 140/80 mmHg  Pulse 68  Temp(Src) 98.6 F (37 C) (Oral)  Wt 130 lb (58.968 kg)  SpO2 96%      Review of Systems  Constitutional: Negative.   HENT: Negative for congestion, dental problem, hearing loss, rhinorrhea, sinus pressure, sore throat and tinnitus.   Eyes: Negative for pain, discharge and visual disturbance.  Respiratory: Negative for cough and shortness of breath.   Cardiovascular: Negative for chest pain, palpitations and leg swelling.  Gastrointestinal: Negative for nausea, vomiting, abdominal pain, diarrhea, constipation, blood in stool and abdominal distention.  Genitourinary: Negative for dysuria, urgency, frequency, hematuria, flank pain, vaginal bleeding, vaginal discharge, difficulty urinating, vaginal pain and pelvic pain.  Musculoskeletal: Positive for gait problem. Negative for joint swelling and arthralgias.  Skin: Negative for rash.  Neurological: Negative for dizziness, syncope, speech difficulty, weakness, numbness and headaches.  Hematological: Negative for adenopathy.  Psychiatric/Behavioral: Negative for behavioral problems, dysphoric mood and agitation. The patient is not nervous/anxious.        Objective:   Physical Exam  Constitutional: She is oriented to person, place, and time. She appears well-developed and well-nourished.  HENT:  Head: Normocephalic.  Right Ear: External ear normal.  Left Ear: External ear normal.  Mouth/Throat: Oropharynx is clear and moist.  Eyes: Conjunctivae and EOM are normal. Pupils are equal, round, and reactive to light.  Neck: Normal range of motion.  Neck supple. No thyromegaly present.  Cardiovascular: Normal rate, regular rhythm, normal heart sounds and intact distal pulses.   Pulmonary/Chest: Effort normal and breath sounds normal.  Abdominal: Soft. Bowel sounds are normal. She exhibits no mass. There is no tenderness.  Musculoskeletal: Normal range of motion. She exhibits no edema.  Lymphadenopathy:    She has no cervical adenopathy.  Neurological: She is alert and oriented to person, place, and time.  Skin: Skin is warm and dry. No rash noted.  Psychiatric: She has a normal mood and affect. Her behavior is normal.          Assessment & Plan:  Stable status post a hip fracture History of atrial fibrillation.  Stable status post RFA Hypothyroidism.  We'll check a TSH.  Continue supplemental levothyroxine  Recheck 6 months

## 2014-09-07 NOTE — Progress Notes (Signed)
Pre visit review using our clinic review tool, if applicable. No additional management support is needed unless otherwise documented below in the visit note. 

## 2014-09-18 ENCOUNTER — Telehealth: Payer: Self-pay | Admitting: Internal Medicine

## 2014-09-18 ENCOUNTER — Telehealth: Payer: Self-pay

## 2014-09-18 MED ORDER — ESTRADIOL 1 MG PO TABS: 1.0000 mg | ORAL_TABLET | Freq: Every day | ORAL | Status: DC

## 2014-09-18 MED ORDER — BISOPROLOL-HYDROCHLOROTHIAZIDE 5-6.25 MG PO TABS: 1.0000 | ORAL_TABLET | Freq: Every day | ORAL | Status: DC

## 2014-09-18 NOTE — Telephone Encounter (Signed)
Rx sent to [facility].  

## 2014-09-18 NOTE — Telephone Encounter (Signed)
Pt rx request for bisoprolol/hctz tab 5mg -6.25mg  Qty: 90 [facility]acy: [facility]

## 2014-09-18 NOTE — Telephone Encounter (Signed)
[facility] refill request for ESTRADIOL 1MG 

## 2014-09-19 ENCOUNTER — Encounter: Payer: Self-pay | Admitting: Internal Medicine

## 2014-09-25 ENCOUNTER — Other Ambulatory Visit: Payer: Self-pay | Admitting: Internal Medicine

## 2014-09-25 MED ORDER — LORAZEPAM 1 MG PO TABS: ORAL_TABLET | ORAL | Status: DC

## 2014-09-25 MED ORDER — LEVOTHYROXINE SODIUM 25 MCG PO CAPS: 1.0000 | ORAL_CAPSULE | Freq: Every day | ORAL | Status: DC

## 2014-09-25 NOTE — Telephone Encounter (Signed)
Pt needs refill on lorazepam 1 mg #135 for 90 day supply. [facility]

## 2014-09-25 NOTE — Telephone Encounter (Signed)
Pt notified Rx sent to [facility].

## 2014-09-26 DIAGNOSIS — S72002D Fracture of unspecified part of neck of left femur, subsequent encounter for closed fracture with routine healing: Secondary | ICD-10-CM | POA: Diagnosis not present

## 2014-10-30 ENCOUNTER — Telehealth: Payer: Self-pay | Admitting: *Deleted

## 2014-10-30 ENCOUNTER — Other Ambulatory Visit: Payer: Self-pay | Admitting: Internal Medicine

## 2014-10-30 DIAGNOSIS — S72002A Fracture of unspecified part of neck of left femur, initial encounter for closed fracture: Secondary | ICD-10-CM

## 2014-10-30 DIAGNOSIS — M810 Age-related osteoporosis without current pathological fracture: Secondary | ICD-10-CM

## 2014-10-30 NOTE — Telephone Encounter (Signed)
Spoke to pt, told her need to have a Bone Density test done since she had fracture in December to follow up and check bones. Pt verbalized understanding. Told pt someone will call her to schedule appointment. Pt verbalized understanding.

## 2014-10-30 NOTE — Telephone Encounter (Signed)
Left message on voicemail to call office.  

## 2014-11-05 ENCOUNTER — Ambulatory Visit (INDEPENDENT_AMBULATORY_CARE_PROVIDER_SITE_OTHER)
Admission: RE | Admit: 2014-11-05 | Discharge: 2014-11-05 | Disposition: A | Discharge status: Home / Self Care | Payer: Commercial Managed Care - HMO | Source: Ambulatory Visit | Attending: Internal Medicine | Admitting: Internal Medicine

## 2014-11-05 DIAGNOSIS — M810 Age-related osteoporosis without current pathological fracture: Secondary | ICD-10-CM

## 2014-11-05 DIAGNOSIS — S72002A Fracture of unspecified part of neck of left femur, initial encounter for closed fracture: Secondary | ICD-10-CM

## 2014-11-07 ENCOUNTER — Telehealth: Payer: Self-pay | Admitting: Internal Medicine

## 2014-11-07 MED ORDER — LORAZEPAM 1 MG PO TABS: ORAL_TABLET | ORAL | Status: DC

## 2014-11-07 NOTE — Telephone Encounter (Signed)
Spoke to pt, told her I see what I did sorry. Will send new Rx to [facility] with correct amount 135 tablets. Pt verbalized understanding. Rx faxed to [facility].

## 2014-11-07 NOTE — Telephone Encounter (Signed)
Patient would like a callback regarding the quantity sent to [facility] on her LORazepam (ATIVAN) 1 MG tablet.  She is taking 1 1/2 pills per day and 90 pills was sent in.

## 2014-12-26 DIAGNOSIS — S72002D Fracture of unspecified part of neck of left femur, subsequent encounter for closed fracture with routine healing: Secondary | ICD-10-CM | POA: Diagnosis not present

## 2015-01-07 ENCOUNTER — Encounter: Payer: Self-pay | Admitting: Internal Medicine

## 2015-01-07 ENCOUNTER — Ambulatory Visit (INDEPENDENT_AMBULATORY_CARE_PROVIDER_SITE_OTHER): Payer: Commercial Managed Care - HMO | Admitting: Internal Medicine

## 2015-01-07 VITALS — BP 130/70 | HR 58 | Temp 97.8°F | Resp 20 | Ht 64.0 in | Wt 132.0 lb

## 2015-01-07 DIAGNOSIS — I10 Essential (primary) hypertension: Secondary | ICD-10-CM

## 2015-01-07 DIAGNOSIS — J309 Allergic rhinitis, unspecified: Secondary | ICD-10-CM | POA: Diagnosis not present

## 2015-01-07 NOTE — Progress Notes (Signed)
Pre visit review using our clinic review tool, if applicable. No additional management support is needed unless otherwise documented below in the visit note. 

## 2015-01-07 NOTE — Patient Instructions (Signed)
Use a once daily antihistamine such as Claritin or Alavert  HOME CARE INSTRUCTIONS  Drink plenty of water. Water helps thin the mucus so your sinuses can drain more easily.  Use a humidifier.  Inhale steam 3 to 4 times a day (for example, sit in the bathroom with the shower running).  Apply a warm, moist washcloth to your face 3 to 4 times a day, or as directed by your health care provider.  Use saline nasal sprays to help moisten and clean your sinuses.  Take medicines only as directed by your health care provider.

## 2015-01-07 NOTE — Progress Notes (Signed)
Subjective:    Patient ID: Megan Olsen, female    DOB: 1927-08-23, 81 y.o.   MRN: [id number]  HPI 81 year old patient who has a history of essential hypertension, allergic rhinitis.  She presents with a one to two-week history of increasing sinus congestion, postnasal drip.  There is been no fever. She has not been taking any medications for the above symptoms.  Past Medical History  Diagnosis Date  . GERD (gastroesophageal reflux disease)   . Depression   . Osteoporosis   . Atrial fibrillation     catheter ablation of SVT in 2002  . Osteoarthritis   . CVA (cerebrovascular accident) 2003    left brain  . Iron deficiency anemia   . Right bbb/left ant fasc block     History   Social History  . Marital Status: Single    Spouse Name: N/A  . Number of Children: N/A  . Years of Education: N/A   Occupational History  . Not on file.   Social History Main Topics  . Smoking status: Never Smoker   . Smokeless tobacco: Never Used  . Alcohol Use: No  . Drug Use: No  . Sexual Activity: No   Other Topics Concern  . Not on file   Social History Narrative    Past Surgical History  Procedure Laterality Date  . Cholecystectomy    . Abdominal hysterectomy    . Total abdominal hysterectomy    . Right eye surgery  2008    dr Zadie Rhine.  Vitrectomy and removal of tissue.   . Colonoscopy    . Laparoscopic ovarian cystectomy    . Hip pinning,cannulated Left 07/07/2014    Procedure: CANNULATED HIP PINNING;  Surgeon: Mauri Pole, MD;  Location: [facility];  Service: Orthopedics;  Laterality: Left;    No family history on file.  Allergies  Allergen Reactions  . Propoxyphene N-Acetaminophen Nausea Only  . Celecoxib Itching and Rash  . Fexofenadine Rash  . Penicillins Hives and Rash    Current Outpatient Prescriptions on File Prior to Visit  Medication Sig Dispense Refill  . acetaminophen (TYLENOL ARTHRITIS PAIN) 650 MG CR tablet Take 650 mg by mouth every 12 (twelve) hours  as needed for pain.    Marland Kitchen alum & mag hydroxide-simeth (MAALOX/MYLANTA) 200-200-20 MG/5ML suspension Take 30 mLs by mouth every 4 (four) hours as needed for indigestion. 355 mL 0  . aspirin EC 325 MG tablet Take 1 tablet (325 mg total) by mouth 2 (two) times daily. Take for 4 weeks 60 tablet 0  . bisoprolol-hydrochlorothiazide (ZIAC) 5-6.25 MG per tablet Take 1 tablet by mouth daily. 90 tablet 1  . cholecalciferol (VITAMIN D) 1000 UNITS tablet Take 1,000 Units by mouth daily with breakfast.    . dexlansoprazole (DEXILANT) 60 MG capsule Take 1 capsule (60 mg total) by mouth daily. 90 capsule 1  . estradiol (ESTRACE) 1 MG tablet Take 1 tablet (1 mg total) by mouth at bedtime. 90 tablet 1  . ipratropium (ATROVENT) 0.03 % nasal spray Place 2 sprays into both nostrils every 12 (twelve) hours. 30 mL 12  . Levothyroxine Sodium 25 MCG CAPS Take 1 capsule (25 mcg total) by mouth daily before breakfast. 90 capsule 3  . LORazepam (ATIVAN) 1 MG tablet TAKE 1/2 TABLET BY MOUTH IN AM, THEN 1 TABLET AT BEDTIME 135 tablet 1  . meloxicam (MOBIC) 15 MG tablet Take 1 tablet (15 mg total) by mouth daily. 90 tablet 3  . Multiple Vitamins-Minerals (HAIR/SKIN/NAILS  PO) Take 1 capsule by mouth daily with breakfast.    . Multiple Vitamins-Minerals (ICAPS) CAPS Take 1 capsule by mouth daily with breakfast.    . Omega 3 1000 MG CAPS Take 1 capsule by mouth daily with breakfast.     Current Facility-Administered Medications on File Prior to Visit  Medication Dose Route Frequency Provider Last Rate Last Dose  . methylPREDNISolone acetate (DEPO-MEDROL) injection 80 mg  80 mg Intramuscular Q1400 Ricard Dillon, MD        BP 130/70 mmHg  Pulse 58  Temp(Src) 97.8 F (36.6 C) (Oral)  Resp 20  Ht 5\' 4"  (1.626 m)  Wt 132 lb (59.875 kg)  BMI 22.65 kg/m2  SpO2 97%       Review of Systems  Constitutional: Negative.   HENT: Positive for congestion and postnasal drip. Negative for dental problem, hearing loss, rhinorrhea,  sinus pressure, sore throat and tinnitus.   Eyes: Negative for pain, discharge and visual disturbance.  Respiratory: Negative for cough and shortness of breath.   Cardiovascular: Negative for chest pain, palpitations and leg swelling.  Gastrointestinal: Negative for nausea, vomiting, abdominal pain, diarrhea, constipation, blood in stool and abdominal distention.  Genitourinary: Negative for dysuria, urgency, frequency, hematuria, flank pain, vaginal bleeding, vaginal discharge, difficulty urinating, vaginal pain and pelvic pain.  Musculoskeletal: Negative for joint swelling, arthralgias and gait problem.  Skin: Negative for rash.  Neurological: Negative for dizziness, syncope, speech difficulty, weakness, numbness and headaches.  Hematological: Negative for adenopathy.  Psychiatric/Behavioral: Negative for behavioral problems, dysphoric mood and agitation. The patient is not nervous/anxious.        Objective:   Physical Exam  Constitutional: She is oriented to person, place, and time. She appears well-developed and well-nourished.  HENT:  Head: Normocephalic.  Right Ear: External ear normal.  Left Ear: External ear normal.  Mouth/Throat: Oropharynx is clear and moist.  Eyes: Conjunctivae and EOM are normal. Pupils are equal, round, and reactive to light.  Neck: Normal range of motion. Neck supple. No thyromegaly present.  Cardiovascular: Normal rate, regular rhythm, normal heart sounds and intact distal pulses.   Pulmonary/Chest: Effort normal and breath sounds normal.  Abdominal: Soft. Bowel sounds are normal. She exhibits no mass. There is no tenderness.  Musculoskeletal: Normal range of motion.  Lymphadenopathy:    She has no cervical adenopathy.  Neurological: She is alert and oriented to person, place, and time.  Skin: Skin is warm and dry. No rash noted.  Psychiatric: She has a normal mood and affect. Her behavior is normal.          Assessment & Plan:   Flare seasonal  allergic rhinitis.  Will treat with oral nonsedating antihistamines as well as saline rinse Hypertension, stable  We'll call if unimproved Schedule CPX

## 2015-02-06 DIAGNOSIS — H35033 Hypertensive retinopathy, bilateral: Secondary | ICD-10-CM | POA: Diagnosis not present

## 2015-02-06 DIAGNOSIS — Z961 Presence of intraocular lens: Secondary | ICD-10-CM | POA: Diagnosis not present

## 2015-02-06 DIAGNOSIS — H43393 Other vitreous opacities, bilateral: Secondary | ICD-10-CM | POA: Diagnosis not present

## 2015-02-06 DIAGNOSIS — H3531 Nonexudative age-related macular degeneration: Secondary | ICD-10-CM | POA: Diagnosis not present

## 2015-02-07 DIAGNOSIS — H35052 Retinal neovascularization, unspecified, left eye: Secondary | ICD-10-CM | POA: Diagnosis not present

## 2015-02-07 DIAGNOSIS — H3532 Exudative age-related macular degeneration: Secondary | ICD-10-CM | POA: Diagnosis not present

## 2015-02-07 DIAGNOSIS — H35051 Retinal neovascularization, unspecified, right eye: Secondary | ICD-10-CM | POA: Diagnosis not present

## 2015-02-07 DIAGNOSIS — H3531 Nonexudative age-related macular degeneration: Secondary | ICD-10-CM | POA: Diagnosis not present

## 2015-02-08 DIAGNOSIS — H3532 Exudative age-related macular degeneration: Secondary | ICD-10-CM | POA: Diagnosis not present

## 2015-02-11 ENCOUNTER — Other Ambulatory Visit: Payer: Self-pay | Admitting: Internal Medicine

## 2015-02-25 ENCOUNTER — Other Ambulatory Visit: Payer: Self-pay | Admitting: Internal Medicine

## 2015-03-14 DIAGNOSIS — H3532 Exudative age-related macular degeneration: Secondary | ICD-10-CM | POA: Diagnosis not present

## 2015-04-18 DIAGNOSIS — H3532 Exudative age-related macular degeneration: Secondary | ICD-10-CM | POA: Diagnosis not present

## 2015-04-25 DIAGNOSIS — H353221 Exudative age-related macular degeneration, left eye, with active choroidal neovascularization: Secondary | ICD-10-CM | POA: Diagnosis not present

## 2015-05-23 DIAGNOSIS — H353211 Exudative age-related macular degeneration, right eye, with active choroidal neovascularization: Secondary | ICD-10-CM | POA: Diagnosis not present

## 2015-05-30 DIAGNOSIS — H353221 Exudative age-related macular degeneration, left eye, with active choroidal neovascularization: Secondary | ICD-10-CM | POA: Diagnosis not present

## 2015-05-31 ENCOUNTER — Other Ambulatory Visit: Payer: Self-pay | Admitting: *Deleted

## 2015-05-31 ENCOUNTER — Ambulatory Visit (INDEPENDENT_AMBULATORY_CARE_PROVIDER_SITE_OTHER): Payer: Commercial Managed Care - HMO | Admitting: *Deleted

## 2015-05-31 DIAGNOSIS — Z23 Encounter for immunization: Secondary | ICD-10-CM

## 2015-05-31 MED ORDER — MELOXICAM 15 MG PO TABS: 15.0000 mg | ORAL_TABLET | Freq: Every day | ORAL | Status: DC

## 2015-05-31 MED ORDER — LORAZEPAM 1 MG PO TABS: ORAL_TABLET | ORAL | Status: DC

## 2015-06-21 ENCOUNTER — Other Ambulatory Visit: Payer: Self-pay | Admitting: Internal Medicine

## 2015-07-03 DIAGNOSIS — S72002D Fracture of unspecified part of neck of left femur, subsequent encounter for closed fracture with routine healing: Secondary | ICD-10-CM | POA: Diagnosis not present

## 2015-07-04 DIAGNOSIS — H353211 Exudative age-related macular degeneration, right eye, with active choroidal neovascularization: Secondary | ICD-10-CM | POA: Diagnosis not present

## 2015-07-10 DIAGNOSIS — H353221 Exudative age-related macular degeneration, left eye, with active choroidal neovascularization: Secondary | ICD-10-CM | POA: Diagnosis not present

## 2015-07-30 ENCOUNTER — Encounter: Payer: Self-pay | Admitting: Internal Medicine

## 2015-08-03 ENCOUNTER — Other Ambulatory Visit: Payer: Self-pay | Admitting: Internal Medicine

## 2015-08-15 DIAGNOSIS — H353211 Exudative age-related macular degeneration, right eye, with active choroidal neovascularization: Secondary | ICD-10-CM | POA: Diagnosis not present

## 2015-08-15 DIAGNOSIS — H353133 Nonexudative age-related macular degeneration, bilateral, advanced atrophic without subfoveal involvement: Secondary | ICD-10-CM | POA: Diagnosis not present

## 2015-08-15 DIAGNOSIS — H353221 Exudative age-related macular degeneration, left eye, with active choroidal neovascularization: Secondary | ICD-10-CM | POA: Diagnosis not present

## 2015-08-15 DIAGNOSIS — H3562 Retinal hemorrhage, left eye: Secondary | ICD-10-CM | POA: Diagnosis not present

## 2015-08-15 DIAGNOSIS — H43822 Vitreomacular adhesion, left eye: Secondary | ICD-10-CM | POA: Diagnosis not present

## 2015-08-22 DIAGNOSIS — H353211 Exudative age-related macular degeneration, right eye, with active choroidal neovascularization: Secondary | ICD-10-CM | POA: Diagnosis not present

## 2015-08-28 ENCOUNTER — Encounter: Payer: Self-pay | Admitting: Internal Medicine

## 2015-08-28 ENCOUNTER — Ambulatory Visit (INDEPENDENT_AMBULATORY_CARE_PROVIDER_SITE_OTHER): Payer: Commercial Managed Care - HMO | Admitting: Internal Medicine

## 2015-08-28 VITALS — BP 120/80 | HR 61 | Temp 97.7°F | Resp 18 | Ht 64.5 in | Wt 133.0 lb

## 2015-08-28 DIAGNOSIS — D509 Iron deficiency anemia, unspecified: Secondary | ICD-10-CM | POA: Diagnosis not present

## 2015-08-28 DIAGNOSIS — Z8673 Personal history of transient ischemic attack (TIA), and cerebral infarction without residual deficits: Secondary | ICD-10-CM

## 2015-08-28 DIAGNOSIS — E039 Hypothyroidism, unspecified: Secondary | ICD-10-CM

## 2015-08-28 DIAGNOSIS — I480 Paroxysmal atrial fibrillation: Secondary | ICD-10-CM

## 2015-08-28 DIAGNOSIS — Z0000 Encounter for general adult medical examination without abnormal findings: Secondary | ICD-10-CM

## 2015-08-28 DIAGNOSIS — I10 Essential (primary) hypertension: Secondary | ICD-10-CM | POA: Diagnosis not present

## 2015-08-28 LAB — CBC WITH DIFFERENTIAL/PLATELET
BASOS ABS: 0 10*3/uL (ref 0.0–0.1)
BASOS PCT: 0.7 % (ref 0.0–3.0)
EOS ABS: 0.3 10*3/uL (ref 0.0–0.7)
Eosinophils Relative: 5.5 % — ABNORMAL HIGH (ref 0.0–5.0)
HEMATOCRIT: 36.8 % (ref 36.0–46.0)
HEMOGLOBIN: 11.9 g/dL — AB (ref 12.0–15.0)
LYMPHS PCT: 16.8 % (ref 12.0–46.0)
Lymphs Abs: 0.9 10*3/uL (ref 0.7–4.0)
MCHC: 32.4 g/dL (ref 30.0–36.0)
MCV: 91.1 fl (ref 78.0–100.0)
MONOS PCT: 10.3 % (ref 3.0–12.0)
Monocytes Absolute: 0.6 10*3/uL (ref 0.1–1.0)
NEUTROS ABS: 3.6 10*3/uL (ref 1.4–7.7)
Neutrophils Relative %: 66.7 % (ref 43.0–77.0)
PLATELETS: 177 10*3/uL (ref 150.0–400.0)
RBC: 4.04 Mil/uL (ref 3.87–5.11)
RDW: 14.6 % (ref 11.5–15.5)
WBC: 5.4 10*3/uL (ref 4.0–10.5)

## 2015-08-28 LAB — COMPREHENSIVE METABOLIC PANEL
ALBUMIN: 4 g/dL (ref 3.5–5.2)
ALT: 12 U/L (ref 0–35)
AST: 18 U/L (ref 0–37)
Alkaline Phosphatase: 124 U/L — ABNORMAL HIGH (ref 39–117)
BUN: 19 mg/dL (ref 6–23)
CALCIUM: 9.3 mg/dL (ref 8.4–10.5)
CHLORIDE: 104 meq/L (ref 96–112)
CO2: 29 meq/L (ref 19–32)
CREATININE: 0.9 mg/dL (ref 0.40–1.20)
GFR: 63.13 mL/min (ref >60.00)
Glucose, Bld: 82 mg/dL (ref 70–99)
Potassium: 4.7 mEq/L (ref 3.5–5.1)
Sodium: 142 mEq/L (ref 135–145)
Total Bilirubin: 0.4 mg/dL (ref 0.2–1.2)
Total Protein: 6.9 g/dL (ref 6.0–8.3)

## 2015-08-28 LAB — LIPID PANEL
CHOL/HDL RATIO: 2
CHOLESTEROL: 161 mg/dL (ref 0–200)
HDL: 70.4 mg/dL (ref >39.00)
LDL CALC: 74 mg/dL (ref 0–99)
NonHDL: 90.33
TRIGLYCERIDES: 82 mg/dL (ref 0.0–149.0)
VLDL: 16.4 mg/dL (ref 0.0–40.0)

## 2015-08-28 LAB — TSH: TSH: 2.66 u[IU]/mL (ref 0.35–4.50)

## 2015-08-28 NOTE — Progress Notes (Signed)
Pre visit review using our clinic review tool, if applicable. No additional management support is needed unless otherwise documented below in the visit note. 

## 2015-08-28 NOTE — Patient Instructions (Signed)
Limit your sodium (Salt) intake  Taper and discontinue Estrace as discussed Meloxicam one daily as needed only  Dexilant one daily as needed only

## 2015-08-28 NOTE — Progress Notes (Signed)
Subjective:    Patient ID: Megan Olsen, female    DOB: Nov 25, 1927, 82 y.o.   MRN: [id number]  HPI    Subjective:    Patient ID: Megan Olsen, female    DOB: 1927/08/14, 82 y.o.   MRN: [id number]  HPI 82 -year-old patient who is seen today for a preventive health examination.  She does remarkably well and has not been seen in approximately 10 months.  In 2002, she underwent RFA for tachyarrhythmias.  She was hospitalized in December 123456 for an embolic stroke.  She was placed on Coumadin anticoagulation for some time and then change to aspirin therapy.  This was discontinued in 2014 when she had GI bleeding secondary to H. pylori ulcer disease.  She remains on PPI therapy.  No concerns or complaints today except a slight limp as residual from a left hip fracture in December 2015 She is followed by general ophthalmology as well as Dr. Zadie Rhine for macular degeneration and apparently some retinal hemorrhages.  Past Medical History  Diagnosis Date  . GERD (gastroesophageal reflux disease)   . Depression   . Osteoporosis   . Atrial fibrillation ([place])     catheter ablation of SVT in 2002  . Osteoarthritis   . CVA (cerebrovascular accident) ([place]) 2003    left brain  . Iron deficiency anemia   . Right bbb/left ant fasc block     Social History   Social History  . Marital Status: Single    Spouse Name: N/A  . Number of Children: N/A  . Years of Education: N/A   Occupational History  . Not on file.   Social History Main Topics  . Smoking status: Never Smoker   . Smokeless tobacco: Never Used  . Alcohol Use: No  . Drug Use: No  . Sexual Activity: No   Other Topics Concern  . Not on file   Social History Narrative    Past Surgical History  Procedure Laterality Date  . Cholecystectomy    . Abdominal hysterectomy    . Total abdominal hysterectomy    . Right eye surgery  2008    dr Zadie Rhine.  Vitrectomy and removal of tissue.   . Colonoscopy    . Laparoscopic ovarian  cystectomy    . Hip pinning,cannulated Left 07/07/2014    Procedure: CANNULATED HIP PINNING;  Surgeon: Mauri Pole, MD;  Location: [facility];  Service: Orthopedics;  Laterality: Left;    No family history on file.  Allergies  Allergen Reactions  . Propoxyphene N-Acetaminophen Nausea Only  . Celecoxib Itching and Rash  . Fexofenadine Rash  . Penicillins Hives and Rash    Current Outpatient Prescriptions on File Prior to Visit  Medication Sig Dispense Refill  . acetaminophen (TYLENOL ARTHRITIS PAIN) 650 MG CR tablet Take 650 mg by mouth every 12 (twelve) hours as needed for pain.    Marland Kitchen alum & mag hydroxide-simeth (MAALOX/MYLANTA) 200-200-20 MG/5ML suspension Take 30 mLs by mouth every 4 (four) hours as needed for indigestion. 355 mL 0  . bisoprolol-hydrochlorothiazide (ZIAC) 5-6.25 MG tablet TAKE 1 TABLET EVERY DAY 90 tablet 1  . cholecalciferol (VITAMIN D) 1000 UNITS tablet Take 1,000 Units by mouth daily with breakfast.    . DEXILANT 60 MG capsule TAKE 1 CAPSULE EVERY DAY 90 capsule 1  . estradiol (ESTRACE) 1 MG tablet TAKE 1 TABLET (1 MG TOTAL) BY MOUTH AT BEDTIME. 90 tablet 1  . Levothyroxine Sodium 25 MCG CAPS Take 1 capsule (25 mcg  total) by mouth daily before breakfast. 90 capsule 3  . LORazepam (ATIVAN) 1 MG tablet TAKE 1/2 TABLET BY MOUTH IN AM, THEN 1 TABLET AT BEDTIME 135 tablet 1  . meloxicam (MOBIC) 15 MG tablet Take 1 tablet (15 mg total) by mouth daily. 90 tablet 1  . Multiple Vitamins-Minerals (HAIR/SKIN/NAILS PO) Take 1 capsule by mouth daily with breakfast.    . Omega 3 1000 MG CAPS Take 1 capsule by mouth daily with breakfast.     Current Facility-Administered Medications on File Prior to Visit  Medication Dose Route Frequency Provider Last Rate Last Dose  . methylPREDNISolone acetate (DEPO-MEDROL) injection 80 mg  80 mg Intramuscular Q1400 Ricard Dillon, MD        BP 120/80 mmHg  Pulse 61  Temp(Src) 97.7 F (36.5 C) (Oral)  Resp 18  Ht 5' 4.5" (1.638 m)  Wt  133 lb (60.328 kg)  BMI 22.48 kg/m2  SpO2 94%   1. Risk factors, based on past  M,S,F history.  Patient has a history of paroxysmal atrial fibrillation as well as hypertension.  Prior CVA  2.  Physical activities: Remains fairly independent, lives alone in a single level apartment  3.  Depression/mood: No history depression or mood disorder  4.  Hearing: Moderate age-related deficits only  5.  ADL's: Independent  6.  Fall risk: Moderate.  History of left hip fracture December 2015  7.  Home safety: No problems identified  8.  Height weight, and visual acuity; height and weight stable.  Is followed by ophthalmology frequently  9.  Counseling: Continue heart healthy diet   10. Lab orders based on risk factors: We'll check laboratory update  11. Referral : Not appropriate at this time  12. Care plan: Continue heart healthy diet  13. Cognitive assessment: Alert and oriented with normal affect no cognitive dysfunction  14. Screening: Patient provided with a written and personalized 5-10 year screening schedule in the AVS.  .  We'll continue annual clinical exams with screening lab  15. Provider List Update: Includes primary care medicine and ophthalmology     Review of Systems  Constitutional: Negative.   HENT: Negative for congestion, dental problem, hearing loss, rhinorrhea, sinus pressure, sore throat and tinnitus.   Eyes: Negative for pain, discharge and visual disturbance.  Respiratory: Negative for cough and shortness of breath.   Cardiovascular: Negative for chest pain, palpitations and leg swelling.  Gastrointestinal: Negative for nausea, vomiting, abdominal pain, diarrhea, constipation, blood in stool and abdominal distention.  Genitourinary: Negative for dysuria, urgency, frequency, hematuria, flank pain, vaginal bleeding, vaginal discharge, difficulty urinating, vaginal pain and pelvic pain.  Musculoskeletal: Positive for back pain and arthralgias. Negative for  joint swelling and gait problem.  Skin: Negative for rash.  Neurological: Negative for dizziness, syncope, speech difficulty, weakness, numbness and headaches.  Hematological: Negative for adenopathy.  Psychiatric/Behavioral: Negative for behavioral problems, dysphoric mood and agitation. The patient is not nervous/anxious.        Objective:   Physical Exam  Constitutional: She is oriented to person, place, and time. She appears well-developed and well-nourished.  HENT:  Head: Normocephalic.  Right Ear: External ear normal.  Left Ear: External ear normal.  Mouth/Throat: Oropharynx is clear and moist.  Eyes: Conjunctivae and EOM are normal. Pupils are equal, round, and reactive to light.  Neck: Normal range of motion. Neck supple. No thyromegaly present.  Cardiovascular: Normal rate, regular rhythm and intact distal pulses.   Murmur heard. Grade 1/6 systolic murmur  at the base  Pulmonary/Chest: Effort normal and breath sounds normal.  Abdominal: Soft. Bowel sounds are normal. She exhibits no mass. There is no tenderness.  Musculoskeletal: Normal range of motion.  Osteoarthritic changes of the small joints of the hands  Lymphadenopathy:    She has no cervical adenopathy.  Neurological: She is alert and oriented to person, place, and time.  Skin: Skin is warm and dry. No rash noted.  Psychiatric: She has a normal mood and affect. Her behavior is normal.          Assessment & Plan:   Hypertension, well-controlled History of atrial fibrillation.  Remains normal sinus rhythm History of H. pylori positive peptic ulcer disease   Osteoarthritis History of iron deficiency anemia History of osteoporosis.     Medications updated Vitamin D supplement.  Encouraged  Review of Systems As above    Objective:   Physical Exam  As above      Assessment & Plan:   As above

## 2015-09-20 ENCOUNTER — Telehealth: Payer: Self-pay | Admitting: Internal Medicine

## 2015-09-20 NOTE — Telephone Encounter (Signed)
Patient want to talk to Megan Olsen about medication DEXILANT 60 MG capsule & meloxicam (MOBIC) 15 MG tablet and know the result of her lab work. Please advise

## 2015-09-23 NOTE — Telephone Encounter (Signed)
Left message on voicemail to call office.  

## 2015-09-24 NOTE — Telephone Encounter (Signed)
Left message on voicemail to call office.  

## 2015-09-26 DIAGNOSIS — H353221 Exudative age-related macular degeneration, left eye, with active choroidal neovascularization: Secondary | ICD-10-CM | POA: Diagnosis not present

## 2015-10-17 DIAGNOSIS — H353133 Nonexudative age-related macular degeneration, bilateral, advanced atrophic without subfoveal involvement: Secondary | ICD-10-CM | POA: Diagnosis not present

## 2015-10-17 DIAGNOSIS — H353211 Exudative age-related macular degeneration, right eye, with active choroidal neovascularization: Secondary | ICD-10-CM | POA: Diagnosis not present

## 2015-11-14 DIAGNOSIS — H353221 Exudative age-related macular degeneration, left eye, with active choroidal neovascularization: Secondary | ICD-10-CM | POA: Diagnosis not present

## 2015-12-12 DIAGNOSIS — H353221 Exudative age-related macular degeneration, left eye, with active choroidal neovascularization: Secondary | ICD-10-CM | POA: Diagnosis not present

## 2015-12-18 ENCOUNTER — Other Ambulatory Visit: Payer: Self-pay | Admitting: Internal Medicine

## 2015-12-20 NOTE — Telephone Encounter (Signed)
Rx for Lorazepam faxed to [facility]. 

## 2015-12-26 ENCOUNTER — Telehealth: Payer: Self-pay | Admitting: Internal Medicine

## 2015-12-26 MED ORDER — LORAZEPAM 1 MG PO TABS: ORAL_TABLET | ORAL | Status: DC

## 2015-12-26 NOTE — Telephone Encounter (Signed)
Spoke to pt, told her that was my fault I will send new Rx over for 135 pills to be dispensed. Pt verbalized understanding. Rx faxed.

## 2015-12-26 NOTE — Telephone Encounter (Signed)
Pt said usually get 135 pills for 90 days and she only received 90 pills  And she is why she received such limited amount

## 2016-01-01 DIAGNOSIS — H353211 Exudative age-related macular degeneration, right eye, with active choroidal neovascularization: Secondary | ICD-10-CM | POA: Diagnosis not present

## 2016-01-01 DIAGNOSIS — H353133 Nonexudative age-related macular degeneration, bilateral, advanced atrophic without subfoveal involvement: Secondary | ICD-10-CM | POA: Diagnosis not present

## 2016-01-01 DIAGNOSIS — H353221 Exudative age-related macular degeneration, left eye, with active choroidal neovascularization: Secondary | ICD-10-CM | POA: Diagnosis not present

## 2016-03-04 ENCOUNTER — Other Ambulatory Visit: Payer: Self-pay | Admitting: Internal Medicine

## 2016-05-05 ENCOUNTER — Other Ambulatory Visit: Payer: Self-pay | Admitting: Internal Medicine

## 2016-05-21 ENCOUNTER — Ambulatory Visit (INDEPENDENT_AMBULATORY_CARE_PROVIDER_SITE_OTHER): Payer: Commercial Managed Care - HMO | Admitting: Internal Medicine

## 2016-05-21 ENCOUNTER — Encounter: Payer: Self-pay | Admitting: Internal Medicine

## 2016-05-21 VITALS — BP 140/64 | HR 77 | Temp 97.5°F | Resp 18 | Ht 64.5 in | Wt 125.0 lb

## 2016-05-21 DIAGNOSIS — I482 Chronic atrial fibrillation, unspecified: Secondary | ICD-10-CM

## 2016-05-21 DIAGNOSIS — I10 Essential (primary) hypertension: Secondary | ICD-10-CM

## 2016-05-21 DIAGNOSIS — M5440 Lumbago with sciatica, unspecified side: Secondary | ICD-10-CM

## 2016-05-21 DIAGNOSIS — Z23 Encounter for immunization: Secondary | ICD-10-CM | POA: Diagnosis not present

## 2016-05-21 DIAGNOSIS — M150 Primary generalized (osteo)arthritis: Secondary | ICD-10-CM

## 2016-05-21 DIAGNOSIS — M159 Polyosteoarthritis, unspecified: Secondary | ICD-10-CM

## 2016-05-21 LAB — BASIC METABOLIC PANEL
BUN: 16 mg/dL (ref 6–23)
CALCIUM: 9.2 mg/dL (ref 8.4–10.5)
CO2: 26 meq/L (ref 19–32)
Chloride: 105 mEq/L (ref 96–112)
Creatinine, Ser: 0.83 mg/dL (ref 0.40–1.20)
GFR: 69.19 mL/min (ref >60.00)
GLUCOSE: 74 mg/dL (ref 70–99)
Potassium: 4.2 mEq/L (ref 3.5–5.1)
SODIUM: 141 meq/L (ref 135–145)

## 2016-05-21 NOTE — Progress Notes (Signed)
Pre visit review using our clinic review tool, if applicable. No additional management support is needed unless otherwise documented below in the visit note. 

## 2016-05-21 NOTE — Patient Instructions (Addendum)
Lumbar MRI as discussed  Limit your sodium (Salt) intake  Please check your blood pressure on a regular basis.  If it is consistently greater than 150/90, please make an office appointment.  Call or return to clinic prn if these symptoms worsen or fail to improve as anticipated. Radicular Pain Radicular pain in either the arm or leg is usually from a bulging or herniated disk in the spine. A piece of the herniated disk may press against the nerves as the nerves exit the spine. This causes pain which is felt at the tips of the nerves down the arm or leg. Other causes of radicular pain may include:  Fractures.  Heart disease.  Cancer.  An abnormal and usually degenerative state of the nervous system or nerves (neuropathy). Diagnosis may require CT or MRI scanning to determine the primary cause.  Nerves that start at the neck (nerve roots) may cause radicular pain in the outer shoulder and arm. It can spread down to the thumb and fingers. The symptoms vary depending on which nerve root has been affected. In most cases radicular pain improves with conservative treatment. Neck problems may require physical therapy, a neck collar, or cervical traction. Treatment may take many weeks, and surgery may be considered if the symptoms do not improve.  Conservative treatment is also recommended for sciatica. Sciatica causes pain to radiate from the lower back or buttock area down the leg into the foot. Often there is a history of back problems. Most patients with sciatica are better after 2 to 4 weeks of rest and other supportive care. Short term bed rest can reduce the disk pressure considerably. Sitting, however, is not a good position since this increases the pressure on the disk. You should avoid bending, lifting, and all other activities which make the problem worse. Traction can be used in severe cases. Surgery is usually reserved for patients who do not improve within the first months of treatment. Only  take over-the-counter or prescription medicines for pain, discomfort, or fever as directed by your caregiver. Narcotics and muscle relaxants may help by relieving more severe pain and spasm and by providing mild sedation. Cold or massage can give significant relief. Spinal manipulation is not recommended. It can increase the degree of disc protrusion. Epidural steroid injections are often effective treatment for radicular pain. These injections deliver medicine to the spinal nerve in the space between the protective covering of the spinal cord and back bones (vertebrae). Your caregiver can give you more information about steroid injections. These injections are most effective when given within two weeks of the onset of pain.  You should see your caregiver for follow up care as recommended. A program for neck and back injury rehabilitation with stretching and strengthening exercises is an important part of management.  SEEK IMMEDIATE MEDICAL CARE IF:  You develop increased pain, weakness, or numbness in your arm or leg.  You develop difficulty with bladder or bowel control.  You develop abdominal pain.   This information is not intended to replace advice given to you by your health care provider. Make sure you discuss any questions you have with your health care provider.   Document Released: 08/13/2004 Document Revised: 07/27/2014 Document Reviewed: 01/30/2015 Elsevier Interactive Patient Education [employer].

## 2016-05-21 NOTE — Progress Notes (Signed)
Subjective:    Patient ID: Megan Olsen, female    DOB: 10-12-1927, 82 y.o.   MRN: [id number]  HPI 82 year old patient who presents with a 4-5 month history of low back pain with radiation down the right leg. She has a history of a remote left hip fracture. She also describes some swelling and pain involving her right knee.  Medical regimen includes Tylenol and meloxicam She states the pain radiates to the right groin down the right leg to the anterior aspect of the right ankle.  She describes inversion of the right foot when she walks  Past Medical History:  Diagnosis Date  . Atrial fibrillation ([place])    catheter ablation of SVT in 2002  . CVA (cerebrovascular accident) ([place]) 2003   left brain  . Depression   . GERD (gastroesophageal reflux disease)   . Iron deficiency anemia   . Osteoarthritis   . Osteoporosis   . Right BBB/left ant fasc block      Social History   Social History  . Marital status: Single    Spouse name: N/A  . Number of children: N/A  . Years of education: N/A   Occupational History  . Not on file.   Social History Main Topics  . Smoking status: Never Smoker  . Smokeless tobacco: Never Used  . Alcohol use No  . Drug use: No  . Sexual activity: No   Other Topics Concern  . Not on file   Social History Narrative  . No narrative on file    Past Surgical History:  Procedure Laterality Date  . ABDOMINAL HYSTERECTOMY    . CHOLECYSTECTOMY    . COLONOSCOPY    . HIP PINNING,CANNULATED Left 07/07/2014   Procedure: CANNULATED HIP PINNING;  Surgeon: Mauri Pole, MD;  Location: [facility];  Service: Orthopedics;  Laterality: Left;  . LAPAROSCOPIC OVARIAN CYSTECTOMY    . right eye surgery  2008   dr Zadie Rhine.  Vitrectomy and removal of tissue.   Marland Kitchen TOTAL ABDOMINAL HYSTERECTOMY      No family history on file.  Allergies  Allergen Reactions  . Propoxyphene N-Acetaminophen Nausea Only  . Celecoxib Itching and Rash  . Fexofenadine Rash  .  Penicillins Hives and Rash    Current Outpatient Prescriptions on File Prior to Visit  Medication Sig Dispense Refill  . acetaminophen (TYLENOL ARTHRITIS PAIN) 650 MG CR tablet Take 650 mg by mouth every 12 (twelve) hours as needed for pain.    . cholecalciferol (VITAMIN D) 1000 UNITS tablet Take 1,000 Units by mouth daily with breakfast.    . DEXILANT 60 MG capsule TAKE 1 CAPSULE EVERY DAY 90 capsule 1  . estradiol (ESTRACE) 1 MG tablet TAKE 1 TABLET (1 MG TOTAL) BY MOUTH AT BEDTIME. 90 tablet 1  . levothyroxine (SYNTHROID, LEVOTHROID) 25 MCG tablet TAKE 1 TABLET EVERY DAY BEFORE BREAKFAST 90 tablet 1  . LORazepam (ATIVAN) 1 MG tablet TAKE 1/2 TABLET IN THE MORNING  THEN TAKE 1 TABLET AT BEDTIME 135 tablet 1  . meloxicam (MOBIC) 15 MG tablet TAKE 1 TABLET EVERY DAY 90 tablet 1  . Multiple Vitamins-Minerals (PRESERVISION AREDS 2) CAPS Take 2 capsules by mouth daily.     Current Facility-Administered Medications on File Prior to Visit  Medication Dose Route Frequency Provider Last Rate Last Dose  . methylPREDNISolone acetate (DEPO-MEDROL) injection 80 mg  80 mg Intramuscular Q1400 Ricard Dillon, MD        BP 140/64 (BP Location: Left  Arm, Patient Position: Sitting, Cuff Size: Normal)   Pulse 77   Temp 97.5 F (36.4 C) (Oral)   Resp 18   Ht 5' 4.5" (1.638 m)   Wt 125 lb (56.7 kg)   SpO2 97%   BMI 21.12 kg/m     Review of Systems  Musculoskeletal: Positive for back pain, gait problem and joint swelling.       Objective:   Physical Exam  Constitutional: She appears well-developed and well-nourished. No distress.  Musculoskeletal:  Walks with a slight limp Negative straight leg test.  Straight leg test causes mild groin discomfort only Slight decreased range of motion of the right hip No obvious inflammatory changes of the right knee  Suggestion of mild weakness in right ankle dorsiflexion  Patellar and Achilles reflexes intact          Assessment & Plan:   Low  back pain with radiculopathy.  Patient has worsened over the past 4 months.  Will schedule lumbar MRI Osteoarthritis Essential hypertension, stable Remote left hip fracture  Nyoka Cowden

## 2016-05-25 ENCOUNTER — Telehealth: Payer: Self-pay | Admitting: Internal Medicine

## 2016-05-25 ENCOUNTER — Ambulatory Visit: Payer: Commercial Managed Care - HMO | Admitting: Internal Medicine

## 2016-05-25 NOTE — Telephone Encounter (Signed)
FYI  Pt would like for you to know that the reason she cancelled her MRI was due to the insurance not paying for it and it is just to expensive for her to pay.

## 2016-05-25 NOTE — Telephone Encounter (Signed)
Neoma Laming, can you please find out why insurance will not pay for MRI? Thanks

## 2016-05-28 ENCOUNTER — Other Ambulatory Visit: Payer: Commercial Managed Care - HMO

## 2016-07-10 ENCOUNTER — Other Ambulatory Visit: Payer: Self-pay | Admitting: Internal Medicine

## 2016-07-14 ENCOUNTER — Telehealth: Payer: Self-pay | Admitting: Internal Medicine

## 2016-07-14 NOTE — Telephone Encounter (Signed)
The lorazepam needs to go to [facility] order and not [facility]. Please send to [facility]

## 2016-07-14 NOTE — Telephone Encounter (Signed)
Medication refill was re-faxed to [facility] mail order pharmacy.

## 2016-07-23 ENCOUNTER — Other Ambulatory Visit: Payer: Self-pay | Admitting: Internal Medicine

## 2016-08-05 ENCOUNTER — Other Ambulatory Visit: Payer: Self-pay | Admitting: Family Medicine

## 2016-08-10 ENCOUNTER — Other Ambulatory Visit: Payer: Self-pay | Admitting: Internal Medicine

## 2016-08-10 MED ORDER — LORAZEPAM 1 MG PO TABS: ORAL_TABLET | ORAL | 0 refills | Status: DC

## 2016-08-26 DIAGNOSIS — Z961 Presence of intraocular lens: Secondary | ICD-10-CM | POA: Diagnosis not present

## 2016-08-26 DIAGNOSIS — H35033 Hypertensive retinopathy, bilateral: Secondary | ICD-10-CM | POA: Diagnosis not present

## 2016-08-26 DIAGNOSIS — H353231 Exudative age-related macular degeneration, bilateral, with active choroidal neovascularization: Secondary | ICD-10-CM | POA: Diagnosis not present

## 2016-08-26 LAB — HM DIABETES EYE EXAM

## 2016-08-28 ENCOUNTER — Encounter: Payer: Self-pay | Admitting: Internal Medicine

## 2016-09-02 ENCOUNTER — Telehealth: Payer: Self-pay | Admitting: Internal Medicine

## 2016-09-02 NOTE — Telephone Encounter (Signed)
Pt has reschedule her MRI from when Dr Raliegh Ip ordered back in November.  Pt states the pain is so bad she can hardly stand. Pt is scheduled next Tues. 2/20  Pt wants to know in the meantime if Dr Raliegh Ip will call I something to give her some relief. Pt states she is miserable.     [id number] - [place], [place] - [address] AT [address]

## 2016-09-03 MED ORDER — HYDROCODONE-ACETAMINOPHEN 5-325 MG PO TABS: 1.0000 | ORAL_TABLET | Freq: Four times a day (QID) | ORAL | 0 refills | Status: DC | PRN

## 2016-09-03 NOTE — Telephone Encounter (Signed)
Okay, Vicodin 5-325 #40 one every 6 hours as needed

## 2016-09-03 NOTE — Telephone Encounter (Signed)
See message below, please advise.

## 2016-09-03 NOTE — Telephone Encounter (Signed)
Pt notified Rx ready for pickup. Rx printed and signed.  

## 2016-09-08 ENCOUNTER — Ambulatory Visit
Admission: RE | Admit: 2016-09-08 | Discharge: 2016-09-08 | Disposition: A | Discharge status: Home / Self Care | Payer: Commercial Managed Care - HMO | Source: Ambulatory Visit | Attending: Internal Medicine | Admitting: Internal Medicine

## 2016-09-08 ENCOUNTER — Other Ambulatory Visit: Payer: Commercial Managed Care - HMO

## 2016-09-08 DIAGNOSIS — M5126 Other intervertebral disc displacement, lumbar region: Secondary | ICD-10-CM | POA: Diagnosis not present

## 2016-09-08 DIAGNOSIS — M5440 Lumbago with sciatica, unspecified side: Secondary | ICD-10-CM

## 2016-09-11 ENCOUNTER — Telehealth: Payer: Self-pay | Admitting: Internal Medicine

## 2016-09-11 NOTE — Telephone Encounter (Signed)
Pt had mri of back on 09-08-16 at  [facility] imaging. Pt is aware md out of office until wed. Pt would like to know if another md could review and nurse call her back with the results today

## 2016-09-11 NOTE — Telephone Encounter (Signed)
See message below, please advise. -Paty 

## 2016-09-11 NOTE — Telephone Encounter (Signed)
Called and informed pt, she was not happy and requested forme to ask another MD. Will as Dr Sherren Mocha on Monday.

## 2016-09-11 NOTE — Telephone Encounter (Signed)
She will need to wait for Dr. Burnice Logan to look at this because we all may give different advice based on the same test

## 2016-09-16 ENCOUNTER — Other Ambulatory Visit: Payer: Self-pay | Admitting: Internal Medicine

## 2016-09-16 DIAGNOSIS — M5440 Lumbago with sciatica, unspecified side: Secondary | ICD-10-CM

## 2016-09-16 NOTE — Telephone Encounter (Signed)
See message below, please advise.

## 2016-09-17 DIAGNOSIS — H353221 Exudative age-related macular degeneration, left eye, with active choroidal neovascularization: Secondary | ICD-10-CM | POA: Diagnosis not present

## 2016-09-17 DIAGNOSIS — H353211 Exudative age-related macular degeneration, right eye, with active choroidal neovascularization: Secondary | ICD-10-CM | POA: Diagnosis not present

## 2016-09-28 DIAGNOSIS — M5416 Radiculopathy, lumbar region: Secondary | ICD-10-CM | POA: Diagnosis not present

## 2016-09-29 ENCOUNTER — Other Ambulatory Visit: Payer: Self-pay | Admitting: Internal Medicine

## 2016-09-30 ENCOUNTER — Encounter: Payer: Self-pay | Admitting: Family Medicine

## 2016-10-13 DIAGNOSIS — M5416 Radiculopathy, lumbar region: Secondary | ICD-10-CM | POA: Diagnosis not present

## 2016-10-22 DIAGNOSIS — H353231 Exudative age-related macular degeneration, bilateral, with active choroidal neovascularization: Secondary | ICD-10-CM | POA: Diagnosis not present

## 2016-10-22 DIAGNOSIS — H353211 Exudative age-related macular degeneration, right eye, with active choroidal neovascularization: Secondary | ICD-10-CM | POA: Diagnosis not present

## 2016-10-22 DIAGNOSIS — H353221 Exudative age-related macular degeneration, left eye, with active choroidal neovascularization: Secondary | ICD-10-CM | POA: Diagnosis not present

## 2016-10-29 DIAGNOSIS — M5416 Radiculopathy, lumbar region: Secondary | ICD-10-CM | POA: Diagnosis not present

## 2016-11-04 ENCOUNTER — Other Ambulatory Visit: Payer: Self-pay | Admitting: Internal Medicine

## 2016-11-18 DIAGNOSIS — M5416 Radiculopathy, lumbar region: Secondary | ICD-10-CM | POA: Diagnosis not present

## 2016-11-25 DIAGNOSIS — H353231 Exudative age-related macular degeneration, bilateral, with active choroidal neovascularization: Secondary | ICD-10-CM | POA: Diagnosis not present

## 2016-11-25 DIAGNOSIS — H353211 Exudative age-related macular degeneration, right eye, with active choroidal neovascularization: Secondary | ICD-10-CM | POA: Diagnosis not present

## 2016-11-25 DIAGNOSIS — H353221 Exudative age-related macular degeneration, left eye, with active choroidal neovascularization: Secondary | ICD-10-CM | POA: Diagnosis not present

## 2016-12-03 ENCOUNTER — Other Ambulatory Visit: Payer: Self-pay | Admitting: Internal Medicine

## 2016-12-07 ENCOUNTER — Ambulatory Visit (INDEPENDENT_AMBULATORY_CARE_PROVIDER_SITE_OTHER): Payer: Medicare HMO | Admitting: Internal Medicine

## 2016-12-07 ENCOUNTER — Encounter: Payer: Self-pay | Admitting: Internal Medicine

## 2016-12-07 VITALS — BP 152/78 | HR 52 | Temp 97.7°F | Ht 65.4 in | Wt 108.4 lb

## 2016-12-07 DIAGNOSIS — M150 Primary generalized (osteo)arthritis: Secondary | ICD-10-CM

## 2016-12-07 DIAGNOSIS — M25551 Pain in right hip: Secondary | ICD-10-CM | POA: Diagnosis not present

## 2016-12-07 DIAGNOSIS — I10 Essential (primary) hypertension: Secondary | ICD-10-CM | POA: Diagnosis not present

## 2016-12-07 DIAGNOSIS — R634 Abnormal weight loss: Secondary | ICD-10-CM

## 2016-12-07 DIAGNOSIS — M159 Polyosteoarthritis, unspecified: Secondary | ICD-10-CM

## 2016-12-07 DIAGNOSIS — I480 Paroxysmal atrial fibrillation: Secondary | ICD-10-CM

## 2016-12-07 MED ORDER — TRAMADOL HCL 50 MG PO TABS: 50.0000 mg | ORAL_TABLET | Freq: Three times a day (TID) | ORAL | 0 refills | Status: DC | PRN

## 2016-12-07 NOTE — Patient Instructions (Signed)
Discontinue levothyroxine  Return in 3 weeks for follow-up

## 2016-12-07 NOTE — Progress Notes (Signed)
Subjective:    Patient ID: Megan Olsen, female    DOB: Aug 17, 1927, 83 y.o.   MRN: [id number]  HPI  Wt Readings from Last 3 Encounters:  12/07/16 108 lb 6.4 oz (49.2 kg)  05/21/16 125 lb (56.7 kg)  08/28/15 133 lb (42.36 kg)   83 year old patient who is seen today in follow-up.  She has been followed closely by [facility] orthopedic with low back and right hip pain with radiation down the right leg to the inner aspect of the right foot. The past 6 months.  She has been quite uncomfortable and has had a difficulty with ambulation. She has had 2 epidural injections without benefit.  She did have a MRI of the lumbar spine in February. There is been significant weight loss associated with weakness and general sense of unwellness.  She claims her appetite is good No other constitutional complaints  Past Medical History:  Diagnosis Date  . Atrial fibrillation ([place])    catheter ablation of SVT in 2002  . CVA (cerebrovascular accident) ([place]) 2003   left brain  . Depression   . GERD (gastroesophageal reflux disease)   . Iron deficiency anemia   . Osteoarthritis   . Osteoporosis   . Right BBB/left ant fasc block      Social History   Social History  . Marital status: Single    Spouse name: N/A  . Number of children: N/A  . Years of education: N/A   Occupational History  . Not on file.   Social History Main Topics  . Smoking status: Never Smoker  . Smokeless tobacco: Never Used  . Alcohol use No  . Drug use: No  . Sexual activity: No   Other Topics Concern  . Not on file   Social History Narrative  . No narrative on file    Past Surgical History:  Procedure Laterality Date  . ABDOMINAL HYSTERECTOMY    . CHOLECYSTECTOMY    . COLONOSCOPY    . HIP PINNING,CANNULATED Left 07/07/2014   Procedure: CANNULATED HIP PINNING;  Surgeon: Mauri Pole, MD;  Location: [facility];  Service: Orthopedics;  Laterality: Left;  . LAPAROSCOPIC OVARIAN CYSTECTOMY    . right eye  surgery  2008   dr Zadie Rhine.  Vitrectomy and removal of tissue.   Marland Kitchen TOTAL ABDOMINAL HYSTERECTOMY      No family history on file.  Allergies  Allergen Reactions  . Propoxyphene N-Acetaminophen Nausea Only  . Celecoxib Itching and Rash  . Fexofenadine Rash  . Penicillins Hives and Rash    Current Outpatient Prescriptions on File Prior to Visit  Medication Sig Dispense Refill  . acetaminophen (TYLENOL ARTHRITIS PAIN) 650 MG CR tablet Take 650 mg by mouth every 12 (twelve) hours as needed for pain.    . bisoprolol-hydrochlorothiazide (ZIAC) 5-6.25 MG tablet TAKE 1 TABLET EVERY DAY 90 tablet 1  . cholecalciferol (VITAMIN D) 1000 UNITS tablet Take 1,000 Units by mouth daily with breakfast.    . DEXILANT 60 MG capsule TAKE 1 CAPSULE EVERY DAY 90 capsule 1  . estradiol (ESTRACE) 1 MG tablet TAKE 1 TABLET AT BEDTIME 90 tablet 1  . LORazepam (ATIVAN) 1 MG tablet TAKE 1/2 TABLET IN THE MORNING ,THEN TAKE 1 TABLET AT BEDTIME 45 tablet 0  . meloxicam (MOBIC) 15 MG tablet TAKE 1 TABLET EVERY DAY 90 tablet 1  . Multiple Vitamins-Minerals (PRESERVISION AREDS 2) CAPS Take 2 capsules by mouth daily.     No current facility-administered medications on file  prior to visit.     BP (!) 152/78 (BP Location: Left Arm, Patient Position: Sitting, Cuff Size: Normal)   Pulse (!) 52   Temp 97.7 F (36.5 C) (Oral)   Ht 5' 5.4" (1.661 m)   Wt 108 lb 6.4 oz (49.2 kg)   SpO2 96%   BMI 17.82 kg/m     Review of Systems  Constitutional: Positive for activity change, fatigue and unexpected weight change.  HENT: Negative for congestion, dental problem, hearing loss, rhinorrhea, sinus pressure, sore throat and tinnitus.   Eyes: Negative for pain, discharge and visual disturbance.  Respiratory: Negative for cough and shortness of breath.   Cardiovascular: Negative for chest pain, palpitations and leg swelling.  Gastrointestinal: Negative for abdominal distention, abdominal pain, blood in stool, constipation,  diarrhea, nausea and vomiting.  Genitourinary: Negative for difficulty urinating, dysuria, flank pain, frequency, hematuria, pelvic pain, urgency, vaginal bleeding, vaginal discharge and vaginal pain.  Musculoskeletal: Positive for arthralgias and back pain. Negative for gait problem and joint swelling.  Skin: Negative for rash.  Neurological: Positive for weakness. Negative for dizziness, syncope, speech difficulty, numbness and headaches.  Hematological: Negative for adenopathy.  Psychiatric/Behavioral: Negative for agitation, behavioral problems and dysphoric mood. The patient is not nervous/anxious.        Objective:   Physical Exam  Constitutional: She is oriented to person, place, and time. She appears well-developed and well-nourished. No distress.  Elderly, frail Walks with a limp and pain with standing from a sitting position and ambulating Repeat blood pressure 130/80   HENT:  Head: Normocephalic.  Right Ear: External ear normal.  Left Ear: External ear normal.  Mouth/Throat: Oropharynx is clear and moist.  Eyes: Conjunctivae and EOM are normal. Pupils are equal, round, and reactive to light.  Neck: Normal range of motion. Neck supple. No thyromegaly present.  Cardiovascular: Normal rate, regular rhythm, normal heart sounds and intact distal pulses.   Pulmonary/Chest: Effort normal and breath sounds normal.  Abdominal: Soft. Bowel sounds are normal. She exhibits no mass. There is no tenderness.  Musculoskeletal: Normal range of motion.  Unable to lay supine with the leg fully extended.  Holds the right leg in a slightly flexed position Range of motion the right hip appeared to be intact  Lymphadenopathy:    She has no cervical adenopathy.  Neurological: She is alert and oriented to person, place, and time.  Reflexes brisk  Skin: Skin is warm and dry. No rash noted.  Psychiatric: She has a normal mood and affect. Her behavior is normal.          Assessment & Plan:     Back right hip and right leg pain.  Follow-up orthopedics.  Check x-ray right hip Weight loss.  Check screening lab History of hypothyroidism.  We'll hold levothyroxin and check TSH.  Follow-up 2-3 weeks Liberal chloric intake.  Discussed and encouraged  Nyoka Cowden

## 2016-12-08 LAB — CBC WITH DIFFERENTIAL/PLATELET
Basophils Absolute: 0 10*3/uL (ref 0.0–0.1)
Basophils Relative: 0.5 % (ref 0.0–3.0)
EOS PCT: 3.2 % (ref 0.0–5.0)
Eosinophils Absolute: 0.2 10*3/uL (ref 0.0–0.7)
HEMATOCRIT: 39.1 % (ref 36.0–46.0)
Hemoglobin: 12.9 g/dL (ref 12.0–15.0)
LYMPHS PCT: 12.6 % (ref 12.0–46.0)
Lymphs Abs: 0.9 10*3/uL (ref 0.7–4.0)
MCHC: 33 g/dL (ref 30.0–36.0)
MCV: 87.1 fl (ref 78.0–100.0)
MONOS PCT: 11.1 % (ref 3.0–12.0)
Monocytes Absolute: 0.8 10*3/uL (ref 0.1–1.0)
Neutro Abs: 5.2 10*3/uL (ref 1.4–7.7)
Neutrophils Relative %: 72.6 % (ref 43.0–77.0)
Platelets: 207 10*3/uL (ref 150.0–400.0)
RBC: 4.49 Mil/uL (ref 3.87–5.11)
RDW: 20.3 % — ABNORMAL HIGH (ref 11.5–15.5)
WBC: 7.2 10*3/uL (ref 4.0–10.5)

## 2016-12-08 LAB — COMPREHENSIVE METABOLIC PANEL
ALBUMIN: 4.1 g/dL (ref 3.5–5.2)
ALT: 11 U/L (ref 0–35)
AST: 19 U/L (ref 0–37)
Alkaline Phosphatase: 113 U/L (ref 39–117)
BUN: 25 mg/dL — ABNORMAL HIGH (ref 6–23)
CALCIUM: 9.5 mg/dL (ref 8.4–10.5)
CHLORIDE: 102 meq/L (ref 96–112)
CO2: 29 meq/L (ref 19–32)
Creatinine, Ser: 0.87 mg/dL (ref 0.40–1.20)
GFR: 65.45 mL/min (ref >60.00)
Glucose, Bld: 87 mg/dL (ref 70–99)
POTASSIUM: 4.4 meq/L (ref 3.5–5.1)
Sodium: 140 mEq/L (ref 135–145)
Total Bilirubin: 0.4 mg/dL (ref 0.2–1.2)
Total Protein: 6.6 g/dL (ref 6.0–8.3)

## 2016-12-08 LAB — T4, FREE: FREE T4: 0.86 ng/dL (ref 0.60–1.60)

## 2016-12-08 LAB — SEDIMENTATION RATE: SED RATE: 21 mm/h (ref 0–30)

## 2016-12-08 LAB — TSH: TSH: 2.24 u[IU]/mL (ref 0.35–4.50)

## 2016-12-16 ENCOUNTER — Telehealth: Payer: Self-pay | Admitting: Internal Medicine

## 2016-12-16 NOTE — Telephone Encounter (Signed)
Informed pt of lab results. Pt wanted to inquire the status of the x-ray of right hip. Also would like your advise on wether she should continue shots with Alex Gardener. Please advise

## 2016-12-16 NOTE — Telephone Encounter (Signed)
Pt stated if you don't get her please leave a msg and she will call back sometime she is not able to get to the phone before it stops ringing.

## 2016-12-16 NOTE — Telephone Encounter (Signed)
Pt called wanted to know her lab results. Please advise

## 2016-12-16 NOTE — Telephone Encounter (Signed)
Please call/notify patient that lab/test/procedure is normal 

## 2016-12-18 ENCOUNTER — Ambulatory Visit (INDEPENDENT_AMBULATORY_CARE_PROVIDER_SITE_OTHER)
Admission: RE | Admit: 2016-12-18 | Discharge: 2016-12-18 | Disposition: A | Discharge status: Home / Self Care | Payer: Medicare HMO | Source: Ambulatory Visit | Attending: Internal Medicine | Admitting: Internal Medicine

## 2016-12-18 ENCOUNTER — Telehealth: Payer: Self-pay | Admitting: Internal Medicine

## 2016-12-18 DIAGNOSIS — M1611 Unilateral primary osteoarthritis, right hip: Secondary | ICD-10-CM | POA: Diagnosis not present

## 2016-12-18 DIAGNOSIS — M25551 Pain in right hip: Secondary | ICD-10-CM

## 2016-12-18 NOTE — Telephone Encounter (Signed)
Notify patient that thyroid function studies are normal and does not need to take thyroid medication at this time.  Please tell patient that x-ray revealed advanced arthritis of her hip and to follow-up with her orthopedic physician

## 2016-12-18 NOTE — Telephone Encounter (Signed)
Left message on machine for patient to return our call 

## 2016-12-18 NOTE — Telephone Encounter (Signed)
No results available for x-rays of the hip and pelvis.  EMR shows that these tests were ordered but not performed Have patient follow with orthopedics Confirm that she had x-rays performed

## 2016-12-18 NOTE — Telephone Encounter (Signed)
Pt calling stating that she received her lab results and wanted to know if she should go back on her thyroid medication it was not conveyed over to her and also wanted to know when she could have her xray done she is aware that she can go at her convenience.

## 2016-12-22 DIAGNOSIS — M25551 Pain in right hip: Secondary | ICD-10-CM | POA: Diagnosis not present

## 2016-12-22 NOTE — Telephone Encounter (Signed)
Patient is aware of results and went to the orthopedic physician and they offered her an injection and she declined.

## 2016-12-22 NOTE — Telephone Encounter (Signed)
Left message on machine for patient to return our call 

## 2016-12-23 ENCOUNTER — Other Ambulatory Visit: Payer: Self-pay | Admitting: Internal Medicine

## 2016-12-23 ENCOUNTER — Telehealth: Payer: Self-pay | Admitting: Internal Medicine

## 2016-12-23 MED ORDER — LORAZEPAM 1 MG PO TABS: ORAL_TABLET | ORAL | 0 refills | Status: DC

## 2016-12-23 NOTE — Telephone Encounter (Signed)
° ° ° °  Pt request refill of the following:  LORazepam (ATIVAN) 1 MG tablet   Phamacy:  [facility] . Pt said she did not received her May RX for the above med  Pt would like a call back    Pharmacy;  [facility]

## 2016-12-23 NOTE — Telephone Encounter (Signed)
Medication Rx was printed awaiting dr Ansel Bong signature

## 2016-12-24 NOTE — Telephone Encounter (Signed)
Rx faxed over to pharmacy

## 2016-12-30 DIAGNOSIS — H353211 Exudative age-related macular degeneration, right eye, with active choroidal neovascularization: Secondary | ICD-10-CM | POA: Diagnosis not present

## 2016-12-30 DIAGNOSIS — H353221 Exudative age-related macular degeneration, left eye, with active choroidal neovascularization: Secondary | ICD-10-CM | POA: Diagnosis not present

## 2016-12-30 DIAGNOSIS — H353231 Exudative age-related macular degeneration, bilateral, with active choroidal neovascularization: Secondary | ICD-10-CM | POA: Diagnosis not present

## 2017-01-13 ENCOUNTER — Ambulatory Visit (INDEPENDENT_AMBULATORY_CARE_PROVIDER_SITE_OTHER): Payer: Medicare HMO | Admitting: Internal Medicine

## 2017-01-13 ENCOUNTER — Encounter: Payer: Self-pay | Admitting: Internal Medicine

## 2017-01-13 VITALS — BP 120/62 | HR 52 | Temp 98.6°F | Ht 64.5 in | Wt 105.4 lb

## 2017-01-13 DIAGNOSIS — I10 Essential (primary) hypertension: Secondary | ICD-10-CM

## 2017-01-13 DIAGNOSIS — M1611 Unilateral primary osteoarthritis, right hip: Secondary | ICD-10-CM | POA: Diagnosis not present

## 2017-01-13 MED ORDER — HYDROCODONE-ACETAMINOPHEN 5-325 MG PO TABS: 1.0000 | ORAL_TABLET | Freq: Four times a day (QID) | ORAL | 0 refills | Status: DC | PRN

## 2017-01-13 NOTE — Patient Instructions (Signed)
  Orthopedic follow-up  Call or return to clinic prn if these symptoms worsen or fail to improve as anticipated.

## 2017-01-13 NOTE — Progress Notes (Signed)
Subjective:    Patient ID: GRAHAM DOUKAS, female    DOB: 08/15/1927, 83 y.o.   MRN: [id number]  HPI  83 year old patientwho has had prior surgery for a left hip fracture and has done well.  For the past several months, she has had severe right hip discomfort.  She has an extremely difficult time bearing weight due to pain and even sleeping at night.  She has been followed by orthopedics and has received some injections without benefit.  She has been on analgesics which have not been well-tolerated or effective. X-ray the right hip a few weeks ago revealed severe arthritis involving the right hip with avascular necrosis.  Past Medical History:  Diagnosis Date  . Atrial fibrillation ([place])    catheter ablation of SVT in 2002  . CVA (cerebrovascular accident) ([place]) 2003   left brain  . Depression   . GERD (gastroesophageal reflux disease)   . Iron deficiency anemia   . Osteoarthritis   . Osteoporosis   . Right BBB/left ant fasc block      Social History   Social History  . Marital status: Single    Spouse name: N/A  . Number of children: N/A  . Years of education: N/A   Occupational History  . Not on file.   Social History Main Topics  . Smoking status: Never Smoker  . Smokeless tobacco: Never Used  . Alcohol use No  . Drug use: No  . Sexual activity: No   Other Topics Concern  . Not on file   Social History Narrative  . No narrative on file    Past Surgical History:  Procedure Laterality Date  . ABDOMINAL HYSTERECTOMY    . CHOLECYSTECTOMY    . COLONOSCOPY    . HIP PINNING,CANNULATED Left 07/07/2014   Procedure: CANNULATED HIP PINNING;  Surgeon: Mauri Pole, MD;  Location: [facility];  Service: Orthopedics;  Laterality: Left;  . LAPAROSCOPIC OVARIAN CYSTECTOMY    . right eye surgery  2008   dr Zadie Rhine.  Vitrectomy and removal of tissue.   Marland Kitchen TOTAL ABDOMINAL HYSTERECTOMY      No family history on file.  Allergies  Allergen Reactions  . Propoxyphene  N-Acetaminophen Nausea Only  . Celecoxib Itching and Rash  . Fexofenadine Rash  . Penicillins Hives and Rash    Current Outpatient Prescriptions on File Prior to Visit  Medication Sig Dispense Refill  . bisoprolol-hydrochlorothiazide (ZIAC) 5-6.25 MG tablet TAKE 1 TABLET EVERY DAY 90 tablet 1  . cholecalciferol (VITAMIN D) 1000 UNITS tablet Take 1,000 Units by mouth daily with breakfast.    . DEXILANT 60 MG capsule TAKE 1 CAPSULE EVERY DAY 90 capsule 1  . LORazepam (ATIVAN) 1 MG tablet TAKE 1/2 TABLET IN THE MORNING ,THEN TAKE 1 TABLET AT BEDTIME 45 tablet 0  . meloxicam (MOBIC) 15 MG tablet TAKE 1 TABLET EVERY DAY 90 tablet 1  . Multiple Vitamins-Minerals (PRESERVISION AREDS 2) CAPS Take 2 capsules by mouth daily.     No current facility-administered medications on file prior to visit.     BP 120/62 (BP Location: Left Arm, Patient Position: Sitting, Cuff Size: Normal)   Pulse (!) 52   Temp 98.6 F (37 C) (Oral)   Ht 5' 4.5" (1.638 m)   Wt 105 lb 6.4 oz (47.8 kg)   SpO2 97%   BMI 17.81 kg/m     Review of Systems  Constitutional: Positive for activity change, appetite change and unexpected weight change.  Musculoskeletal:  Positive for arthralgias and gait problem.  Psychiatric/Behavioral: Positive for sleep disturbance.       Objective:   Physical Exam  Constitutional: She is oriented to person, place, and time. She appears well-developed and well-nourished. No distress.  Musculoskeletal:  Severe pain, right hip Difficult to ambulate even with a cane  Neurological: She is alert and oriented to person, place, and time.          Assessment & Plan:   End-stage arthritis, right hip.  Severely symptomatic.  Patient was told to follow-up with orthopedics and impress upon them the severity of her symptoms and that she is willing to undergo the risk of surgery.  She did well with left hip surgery a few years ago.  She was given a prescription for hydrocodone which has been  helpful in the past.  A copy of her x-ray report was given to share with her orthopedic physician Weight loss.  Aggravated by chronic pain Essential hypertension, stable  Nyoka Cowden

## 2017-01-18 ENCOUNTER — Other Ambulatory Visit: Payer: Self-pay | Admitting: Internal Medicine

## 2017-01-19 DIAGNOSIS — M1611 Unilateral primary osteoarthritis, right hip: Secondary | ICD-10-CM | POA: Diagnosis not present

## 2017-01-19 DIAGNOSIS — M25551 Pain in right hip: Secondary | ICD-10-CM | POA: Diagnosis not present

## 2017-01-19 DIAGNOSIS — M87050 Idiopathic aseptic necrosis of pelvis: Secondary | ICD-10-CM | POA: Diagnosis not present

## 2017-01-19 NOTE — Telephone Encounter (Signed)
12/23/16 was last refill. Rx must last 30 days. May call in Rx on 01/22/17. 

## 2017-01-25 ENCOUNTER — Telehealth: Payer: Self-pay | Admitting: Internal Medicine

## 2017-01-25 NOTE — Telephone Encounter (Signed)
Received and placed in Dr. Marthann Schiller folder for distribution.

## 2017-01-25 NOTE — Telephone Encounter (Signed)
Patient dropped off a surgery clearance form Fax to [phone] ATTN: Orson Slick Disposition: Dr's Folder

## 2017-01-27 NOTE — Telephone Encounter (Signed)
Faxed to [facility].  Received verification that the fax was successful.  Copy sent to scan and I retained a copy for my records.

## 2017-01-29 DIAGNOSIS — M25551 Pain in right hip: Secondary | ICD-10-CM | POA: Diagnosis not present

## 2017-01-29 DIAGNOSIS — M87051 Idiopathic aseptic necrosis of right femur: Secondary | ICD-10-CM | POA: Diagnosis not present

## 2017-01-29 NOTE — H&P (Signed)
TOTAL HIP ADMISSION H&P  Patient is admitted for right total hip arthroplasty, anterior approach.  Subjective:  Chief Complaint:   Right hip AVN, OA and pain  HPI: Megan Olsen, 83 y.o. female, has a history of pain and functional disability in the right hip(s) due to arthritis and AVN and patient has failed non-surgical conservative treatments for greater than 12 weeks to include NSAID's and/or analgesics, corticosteriod injections, use of assistive devices and activity modification.  Onset of symptoms was gradual starting October 2017 with rapidlly worsening course since that time.The patient noted no past surgery on the right hip(s).  Patient currently rates pain in the right hip at 8 out of 10 with activity. Patient has night pain, worsening of pain with activity and weight bearing, trendelenberg gait, pain that interfers with activities of daily living and pain with passive range of motion. Patient has evidence of periarticular osteophytes, joint space narrowing and AVN by imaging studies. This condition presents safety issues increasing the risk of falls.  There is no current active infection.  Risks, benefits and expectations were discussed with the patient.  Risks including but not limited to the risk of anesthesia, blood clots, nerve damage, blood vessel damage, failure of the prosthesis, infection and up to and including death.  Patient understand the risks, benefits and expectations and wishes to proceed with surgery.   PCP: Marletta Lor, MD  D/C Plans:       Home   Post-op Meds:       No Rx given  Tranexamic Acid:      To be given - IV   Decadron:      Is to be given  FYI:     ASA  Norco   DME:   Pt already has equipment  PT:   No PT     Patient Active Problem List   Diagnosis Date Noted  . Arthritis of right hip 01/13/2017  . Hypothyroidism 09/07/2014  . A-fib ([place]) 07/06/2014  . Hip fracture, left ([place]) 07/06/2014  . Benign essential HTN 07/06/2014  . GERD  (gastroesophageal reflux disease) 07/06/2014  . Anxiety disorder 07/06/2014  . History of stroke without residual deficits 07/06/2014  . Iron deficiency anemia, unspecified 01/02/2013  . ROTATOR CUFF SYNDROME, LEFT 01/07/2010  . Backache 08/08/2008  . CONSTIPATION, INTERMITTENT 08/04/2007  . DEPRESSION 01/11/2007  . Allergic rhinitis 01/11/2007  . Osteoarthritis 01/11/2007  . Osteoporosis 01/11/2007   Past Medical History:  Diagnosis Date  . Atrial fibrillation ([place])    catheter ablation of SVT in 2002  . CVA (cerebrovascular accident) ([place]) 2003   left brain  . Depression   . GERD (gastroesophageal reflux disease)   . Iron deficiency anemia   . Osteoarthritis   . Osteoporosis   . Right BBB/left ant fasc block     Past Surgical History:  Procedure Laterality Date  . ABDOMINAL HYSTERECTOMY    . CHOLECYSTECTOMY    . COLONOSCOPY    . HIP PINNING,CANNULATED Left 07/07/2014   Procedure: CANNULATED HIP PINNING;  Surgeon: Mauri Pole, MD;  Location: [facility];  Service: Orthopedics;  Laterality: Left;  . LAPAROSCOPIC OVARIAN CYSTECTOMY    . right eye surgery  2008   dr Zadie Rhine.  Vitrectomy and removal of tissue.   Marland Kitchen TOTAL ABDOMINAL HYSTERECTOMY      No prescriptions prior to admission.   Allergies  Allergen Reactions  . Propoxyphene N-Acetaminophen Nausea Only  . Celecoxib Itching and Rash  . Fexofenadine Rash  .  Penicillins Hives and Rash    Social History  Substance Use Topics  . Smoking status: Never Smoker  . Smokeless tobacco: Never Used  . Alcohol use No       Review of Systems  Constitutional: Negative.   HENT: Negative.   Eyes: Negative.   Respiratory: Negative.   Cardiovascular: Negative.   Gastrointestinal: Positive for constipation and heartburn.  Genitourinary: Negative.   Musculoskeletal: Positive for joint pain.  Skin: Negative.   Neurological: Negative.   Endo/Heme/Allergies: Positive for environmental allergies.  Psychiatric/Behavioral:  Positive for depression. The patient is nervous/anxious.     Objective:  Physical Exam  Constitutional: She is oriented to person, place, and time. She appears well-developed.  HENT:  Head: Normocephalic.  Eyes: Pupils are equal, round, and reactive to light.  Neck: Neck supple. No JVD present. No tracheal deviation present. No thyromegaly present.  Cardiovascular: Normal rate, regular rhythm and intact distal pulses.   History of a-fib  Respiratory: Effort normal and breath sounds normal. No respiratory distress. She has no wheezes.  GI: Soft. There is no tenderness. There is no guarding.  Musculoskeletal:       Right hip: She exhibits decreased range of motion, decreased strength, tenderness and bony tenderness. She exhibits no swelling, no deformity and no laceration.  Lymphadenopathy:    She has no cervical adenopathy.  Neurological: She is alert and oriented to person, place, and time. A sensory deficit (numbness right LE) is present.  Skin: Skin is warm and dry.  Psychiatric: She has a normal mood and affect.      Labs:  Estimated body mass index is 17.81 kg/m as calculated from the following:   Height as of 01/13/17: 5' 4.5" (1.638 m).   Weight as of 01/13/17: 47.8 kg (105 lb 6.4 oz).   Imaging Review Plain radiographs demonstrate severe degenerative joint disease of the right hip(s). The bone quality appears to be good for age and reported activity level.  Assessment/Plan:  End stage arthritis, right hip(s)  The patient history, physical examination, clinical judgement of the provider and imaging studies are consistent with end stage degenerative joint disease of the right hip(s) and total hip arthroplasty is deemed medically necessary. The treatment options including medical management, injection therapy, arthroscopy and arthroplasty were discussed at length. The risks and benefits of total hip arthroplasty were presented and reviewed. The risks due to aseptic  loosening, infection, stiffness, dislocation/subluxation,  thromboembolic complications and other imponderables were discussed.  The patient acknowledged the explanation, agreed to proceed with the plan and consent was signed. Patient is being admitted for inpatient treatment for surgery, pain control, PT, OT, prophylactic antibiotics, VTE prophylaxis, progressive ambulation and ADL's and discharge planning.The patient is planning to be discharged home.       [place] Zaidy Absher   PA-C  01/29/2017, 11:32 AM

## 2017-02-03 DIAGNOSIS — H353211 Exudative age-related macular degeneration, right eye, with active choroidal neovascularization: Secondary | ICD-10-CM | POA: Diagnosis not present

## 2017-02-03 DIAGNOSIS — H353231 Exudative age-related macular degeneration, bilateral, with active choroidal neovascularization: Secondary | ICD-10-CM | POA: Diagnosis not present

## 2017-02-03 DIAGNOSIS — H353221 Exudative age-related macular degeneration, left eye, with active choroidal neovascularization: Secondary | ICD-10-CM | POA: Diagnosis not present

## 2017-02-11 ENCOUNTER — Other Ambulatory Visit (HOSPITAL_COMMUNITY): Payer: Self-pay | Admitting: Emergency Medicine

## 2017-02-11 NOTE — Progress Notes (Signed)
Clearance Dr Inda Merlin 01-13-17 on chart

## 2017-02-11 NOTE — Patient Instructions (Signed)
Megan Olsen  02/11/2017   Your procedure is scheduled on: 02-23-17  Report to [facility] Main  Entrance .   Report to admitting at 515AM   Call this number if you have problems the morning of surgery [phone]   Remember: ONLY 1 PERSON MAY GO WITH YOU TO SHORT STAY TO GET  READY MORNING OF YOUR SURGERY.  Do not eat food or drink liquids :After Midnight.     Take these medicines the morning of surgery with A SIP OF WATER: EYE DROPS, DEXILANT                                You may not have any metal on your body including hair pins and              piercings  Do not wear jewelry, make-up, lotions, powders or perfumes, deodorant             Do not wear nail polish.  Do not shave  48 hours prior to surgery.                Do not bring valuables to the hospital. Megan Olsen.  Contacts, dentures or bridgework may not be worn into surgery.  Leave suitcase in the car. After surgery it may be brought to your room.               Please read over the following fact sheets you were given: _____________________________________________________________________           [facility] - Preparing for Surgery Before surgery, you can play an important role.  Because skin is not sterile, your skin needs to be as free of germs as possible.  You can reduce the number of germs on your skin by washing with CHG (chlorahexidine gluconate) soap before surgery.  CHG is an antiseptic cleaner which kills germs and bonds with the skin to continue killing germs even after washing. Please DO NOT use if you have an allergy to CHG or antibacterial soaps.  If your skin becomes reddened/irritated stop using the CHG and inform your nurse when you arrive at Short Stay. Do not shave (including legs and underarms) for at least 48 hours prior to the first CHG shower.  You may shave your face/neck. Please follow these instructions carefully:  1.   Shower with CHG Soap the night before surgery and the  morning of Surgery.  2.  If you choose to wash your hair, wash your hair first as usual with your  normal  shampoo.  3.  After you shampoo, rinse your hair and body thoroughly to remove the  shampoo.                           4.  Use CHG as you would any other liquid soap.  You can apply chg directly  to the skin and wash                       Gently with a scrungie or clean washcloth.  5.  Apply the CHG Soap to your body ONLY FROM THE NECK DOWN.   Do not use on  face/ open                           Wound or open sores. Avoid contact with eyes, ears mouth and genitals (private parts).                       Wash face,  Genitals (private parts) with your normal soap.             6.  Wash thoroughly, paying special attention to the area where your surgery  will be performed.  7.  Thoroughly rinse your body with warm water from the neck down.  8.  DO NOT shower/wash with your normal soap after using and rinsing off  the CHG Soap.                9.  Pat yourself dry with a clean towel.            10.  Wear clean pajamas.            11.  Place clean sheets on your bed the night of your first shower and do not  sleep with pets. Day of Surgery : Do not apply any lotions/deodorants the morning of surgery.  Please wear clean clothes to the hospital/surgery center.  FAILURE TO FOLLOW THESE INSTRUCTIONS MAY RESULT IN THE CANCELLATION OF YOUR SURGERY PATIENT SIGNATURE_________________________________  NURSE SIGNATURE__________________________________  ________________________________________________________________________   [name]  An incentive spirometer is a tool that can help keep your lungs clear and active. This tool measures how well you are filling your lungs with each breath. Taking long deep breaths may help reverse or decrease the chance of developing breathing (pulmonary) problems (especially infection) following:  A long  period of time when you are unable to move or be active. BEFORE THE PROCEDURE   If the spirometer includes an indicator to show your best effort, your nurse or respiratory therapist will set it to a desired goal.  If possible, sit up straight or lean slightly forward. Try not to slouch.  Hold the incentive spirometer in an upright position. INSTRUCTIONS FOR USE  1. Sit on the edge of your bed if possible, or sit up as far as you can in bed or on a chair. 2. Hold the incentive spirometer in an upright position. 3. Breathe out normally. 4. Place the mouthpiece in your mouth and seal your lips tightly around it. 5. Breathe in slowly and as deeply as possible, raising the piston or the ball toward the top of the column. 6. Hold your breath for 3-5 seconds or for as long as possible. Allow the piston or ball to fall to the bottom of the column. 7. Remove the mouthpiece from your mouth and breathe out normally. 8. Rest for a few seconds and repeat Steps 1 through 7 at least 10 times every 1-2 hours when you are awake. Take your time and take a few normal breaths between deep breaths. 9. The spirometer may include an indicator to show your best effort. Use the indicator as a goal to work toward during each repetition. 10. After each set of 10 deep breaths, practice coughing to be sure your lungs are clear. If you have an incision (the cut made at the time of surgery), support your incision when coughing by placing a pillow or rolled up towels firmly against it. Once you are able to get out of bed, walk around indoors  and cough well. You may stop using the incentive spirometer when instructed by your caregiver.  RISKS AND COMPLICATIONS  Take your time so you do not get dizzy or light-headed.  If you are in pain, you may need to take or ask for pain medication before doing incentive spirometry. It is harder to take a deep breath if you are having pain. AFTER USE  Rest and breathe slowly and  easily.  It can be helpful to keep track of a log of your progress. Your caregiver can provide you with a simple table to help with this. If you are using the spirometer at home, follow these instructions: [place] IF:   You are having difficultly using the spirometer.  You have trouble using the spirometer as often as instructed.  Your pain medication is not giving enough relief while using the spirometer.  You develop fever of 100.5 F (38.1 C) or higher. SEEK IMMEDIATE MEDICAL CARE IF:   You cough up bloody sputum that had not been present before.  You develop fever of 102 F (38.9 C) or greater.  You develop worsening pain at or near the incision site. MAKE SURE YOU:   Understand these instructions.  Will watch your condition.  Will get help right away if you are not doing well or get worse. Document Released: 11/16/2006 Document Revised: 09/28/2011 Document Reviewed: 01/17/2007 ExitCare Patient Information 2014 ExitCare, [place].   ________________________________________________________________________  WHAT IS A BLOOD TRANSFUSION? Blood Transfusion Information  A transfusion is the replacement of blood or some of its parts. Blood is made up of multiple cells which provide different functions.  Red blood cells carry oxygen and are used for blood loss replacement.  White blood cells fight against infection.  Platelets control bleeding.  Plasma helps clot blood.  Other blood products are available for specialized needs, such as hemophilia or other clotting disorders. BEFORE THE TRANSFUSION  Who gives blood for transfusions?   Healthy volunteers who are fully evaluated to make sure their blood is safe. This is blood bank blood. Transfusion therapy is the safest it has ever been in the practice of medicine. Before blood is taken from a donor, a complete history is taken to make sure that person has no history of diseases nor engages in risky social  behavior (examples are intravenous drug use or sexual activity with multiple partners). The donor's travel history is screened to minimize risk of transmitting infections, such as malaria. The donated blood is tested for signs of infectious diseases, such as HIV and hepatitis. The blood is then tested to be sure it is compatible with you in order to minimize the chance of a transfusion reaction. If you or a relative donates blood, this is often done in anticipation of surgery and is not appropriate for emergency situations. It takes many days to process the donated blood. RISKS AND COMPLICATIONS Although transfusion therapy is very safe and saves many lives, the main dangers of transfusion include:   Getting an infectious disease.  Developing a transfusion reaction. This is an allergic reaction to something in the blood you were given. Every precaution is taken to prevent this. The decision to have a blood transfusion has been considered carefully by your caregiver before blood is given. Blood is not given unless the benefits outweigh the risks. AFTER THE TRANSFUSION  Right after receiving a blood transfusion, you will usually feel much better and more energetic. This is especially true if your red blood cells have gotten low (  anemic). The transfusion raises the level of the red blood cells which carry oxygen, and this usually causes an energy increase.  The nurse administering the transfusion will monitor you carefully for complications. HOME CARE INSTRUCTIONS  No special instructions are needed after a transfusion. You may find your energy is better. Speak with your caregiver about any limitations on activity for underlying diseases you may have. SEEK MEDICAL CARE IF:   Your condition is not improving after your transfusion.  You develop redness or irritation at the intravenous (IV) site. SEEK IMMEDIATE MEDICAL CARE IF:  Any of the following symptoms occur over the next 12 hours:  Shaking  chills.  You have a temperature by mouth above 102 F (38.9 C), not controlled by medicine.  Chest, back, or muscle pain.  People around you feel you are not acting correctly or are confused.  Shortness of breath or difficulty breathing.  Dizziness and fainting.  You get a rash or develop hives.  You have a decrease in urine output.  Your urine turns a dark color or changes to pink, red, or brown. Any of the following symptoms occur over the next 10 days:  You have a temperature by mouth above 102 F (38.9 C), not controlled by medicine.  Shortness of breath.  Weakness after normal activity.  The white part of the eye turns yellow (jaundice).  You have a decrease in the amount of urine or are urinating less often.  Your urine turns a dark color or changes to pink, red, or brown. Document Released: 07/03/2000 Document Revised: 09/28/2011 Document Reviewed: 02/20/2008 [facility] Patient Information 2014 [place], [place].  _______________________________________________________________________

## 2017-02-15 ENCOUNTER — Encounter (INDEPENDENT_AMBULATORY_CARE_PROVIDER_SITE_OTHER): Payer: Self-pay

## 2017-02-15 ENCOUNTER — Encounter (HOSPITAL_COMMUNITY)
Admission: RE | Admit: 2017-02-15 | Discharge: 2017-02-15 | Disposition: A | Discharge status: Home / Self Care | Payer: Medicare HMO | Source: Ambulatory Visit | Attending: Orthopedic Surgery | Admitting: Orthopedic Surgery

## 2017-02-15 ENCOUNTER — Encounter (HOSPITAL_COMMUNITY): Payer: Self-pay

## 2017-02-15 ENCOUNTER — Other Ambulatory Visit: Payer: Self-pay

## 2017-02-15 DIAGNOSIS — M87051 Idiopathic aseptic necrosis of right femur: Secondary | ICD-10-CM | POA: Insufficient documentation

## 2017-02-15 DIAGNOSIS — Z01810 Encounter for preprocedural cardiovascular examination: Secondary | ICD-10-CM | POA: Insufficient documentation

## 2017-02-15 DIAGNOSIS — Z01812 Encounter for preprocedural laboratory examination: Secondary | ICD-10-CM | POA: Insufficient documentation

## 2017-02-15 LAB — CBC
HEMATOCRIT: 35.3 % — AB (ref 36.0–46.0)
HEMOGLOBIN: 11.5 g/dL — AB (ref 12.0–15.0)
MCH: 29.6 pg (ref 26.0–34.0)
MCHC: 32.6 g/dL (ref 30.0–36.0)
MCV: 90.7 fL (ref 78.0–100.0)
Platelets: 191 10*3/uL (ref 150–400)
RBC: 3.89 MIL/uL (ref 3.87–5.11)
RDW: 16.4 % — ABNORMAL HIGH (ref 11.5–15.5)
WBC: 6.8 10*3/uL (ref 4.0–10.5)

## 2017-02-15 LAB — BASIC METABOLIC PANEL
ANION GAP: 7 (ref 5–15)
BUN: 24 mg/dL — ABNORMAL HIGH (ref 6–20)
CALCIUM: 8.2 mg/dL — AB (ref 8.9–10.3)
CHLORIDE: 104 mmol/L (ref 101–111)
CO2: 27 mmol/L (ref 22–32)
Creatinine, Ser: 0.93 mg/dL (ref 0.44–1.00)
GFR calc non Af Amer: 54 mL/min — ABNORMAL LOW (ref >60)
Glucose, Bld: 90 mg/dL (ref 65–99)
POTASSIUM: 4.2 mmol/L (ref 3.5–5.1)
Sodium: 138 mmol/L (ref 135–145)

## 2017-02-15 LAB — SURGICAL PCR SCREEN
MRSA, PCR: NEGATIVE
Staphylococcus aureus: NEGATIVE

## 2017-02-16 ENCOUNTER — Other Ambulatory Visit: Payer: Self-pay | Admitting: Internal Medicine

## 2017-02-18 ENCOUNTER — Ambulatory Visit: Payer: Medicare HMO

## 2017-02-19 ENCOUNTER — Telehealth: Payer: Self-pay | Admitting: Internal Medicine

## 2017-02-22 NOTE — Telephone Encounter (Signed)
Pt would also like to have  meloxicam (MOBIC) 15 MG tablet 90 tabs In addition to the  LORazepam (ATIVAN) 1 MG tablet  [place], [place]

## 2017-02-23 ENCOUNTER — Inpatient Hospital Stay (HOSPITAL_COMMUNITY): Payer: Medicare HMO

## 2017-02-23 ENCOUNTER — Inpatient Hospital Stay (HOSPITAL_COMMUNITY)
Admission: RE | Admit: 2017-02-23 | Discharge: 2017-02-25 | DRG: 470 | Disposition: A | Discharge status: Skilled Nursing Facility | Payer: Medicare HMO | Source: Ambulatory Visit | Attending: Orthopedic Surgery | Admitting: Orthopedic Surgery

## 2017-02-23 ENCOUNTER — Inpatient Hospital Stay (HOSPITAL_COMMUNITY): Payer: Medicare HMO | Admitting: Certified Registered Nurse Anesthetist

## 2017-02-23 ENCOUNTER — Other Ambulatory Visit: Payer: Self-pay | Admitting: Internal Medicine

## 2017-02-23 ENCOUNTER — Encounter (HOSPITAL_COMMUNITY): Payer: Self-pay | Admitting: *Deleted

## 2017-02-23 ENCOUNTER — Encounter (HOSPITAL_COMMUNITY)
Admission: RE | Disposition: A | Discharge status: Skilled Nursing Facility | Payer: Self-pay | Source: Ambulatory Visit | Attending: Orthopedic Surgery

## 2017-02-23 DIAGNOSIS — Z471 Aftercare following joint replacement surgery: Secondary | ICD-10-CM | POA: Diagnosis not present

## 2017-02-23 DIAGNOSIS — M1611 Unilateral primary osteoarthritis, right hip: Secondary | ICD-10-CM | POA: Diagnosis not present

## 2017-02-23 DIAGNOSIS — M90551 Osteonecrosis in diseases classified elsewhere, right thigh: Secondary | ICD-10-CM | POA: Diagnosis present

## 2017-02-23 DIAGNOSIS — R262 Difficulty in walking, not elsewhere classified: Secondary | ICD-10-CM | POA: Diagnosis not present

## 2017-02-23 DIAGNOSIS — Z79899 Other long term (current) drug therapy: Secondary | ICD-10-CM | POA: Diagnosis not present

## 2017-02-23 DIAGNOSIS — Z96641 Presence of right artificial hip joint: Secondary | ICD-10-CM

## 2017-02-23 DIAGNOSIS — M6281 Muscle weakness (generalized): Secondary | ICD-10-CM | POA: Diagnosis not present

## 2017-02-23 DIAGNOSIS — Z8673 Personal history of transient ischemic attack (TIA), and cerebral infarction without residual deficits: Secondary | ICD-10-CM

## 2017-02-23 DIAGNOSIS — K219 Gastro-esophageal reflux disease without esophagitis: Secondary | ICD-10-CM | POA: Diagnosis present

## 2017-02-23 DIAGNOSIS — M810 Age-related osteoporosis without current pathological fracture: Secondary | ICD-10-CM | POA: Diagnosis not present

## 2017-02-23 DIAGNOSIS — F418 Other specified anxiety disorders: Secondary | ICD-10-CM | POA: Diagnosis not present

## 2017-02-23 DIAGNOSIS — F419 Anxiety disorder, unspecified: Secondary | ICD-10-CM | POA: Diagnosis present

## 2017-02-23 DIAGNOSIS — M87051 Idiopathic aseptic necrosis of right femur: Secondary | ICD-10-CM | POA: Diagnosis not present

## 2017-02-23 DIAGNOSIS — M1612 Unilateral primary osteoarthritis, left hip: Secondary | ICD-10-CM | POA: Diagnosis not present

## 2017-02-23 DIAGNOSIS — I4891 Unspecified atrial fibrillation: Secondary | ICD-10-CM | POA: Diagnosis present

## 2017-02-23 DIAGNOSIS — Z886 Allergy status to analgesic agent status: Secondary | ICD-10-CM | POA: Diagnosis not present

## 2017-02-23 DIAGNOSIS — Z885 Allergy status to narcotic agent status: Secondary | ICD-10-CM | POA: Diagnosis not present

## 2017-02-23 DIAGNOSIS — Z888 Allergy status to other drugs, medicaments and biological substances status: Secondary | ICD-10-CM | POA: Diagnosis not present

## 2017-02-23 DIAGNOSIS — E039 Hypothyroidism, unspecified: Secondary | ICD-10-CM | POA: Diagnosis not present

## 2017-02-23 DIAGNOSIS — I10 Essential (primary) hypertension: Secondary | ICD-10-CM | POA: Diagnosis present

## 2017-02-23 DIAGNOSIS — R278 Other lack of coordination: Secondary | ICD-10-CM | POA: Diagnosis not present

## 2017-02-23 DIAGNOSIS — R41841 Cognitive communication deficit: Secondary | ICD-10-CM | POA: Diagnosis not present

## 2017-02-23 DIAGNOSIS — Z96649 Presence of unspecified artificial hip joint: Secondary | ICD-10-CM

## 2017-02-23 DIAGNOSIS — Z880 Allergy status to penicillin: Secondary | ICD-10-CM | POA: Diagnosis not present

## 2017-02-23 DIAGNOSIS — D509 Iron deficiency anemia, unspecified: Secondary | ICD-10-CM | POA: Diagnosis not present

## 2017-02-23 HISTORY — PX: TOTAL HIP ARTHROPLASTY: SHX124

## 2017-02-23 LAB — TYPE AND SCREEN
ABO/RH(D): A POS
Antibody Screen: NEGATIVE

## 2017-02-23 SURGERY — ARTHROPLASTY, HIP, TOTAL, ANTERIOR APPROACH
Anesthesia: Spinal | Site: Hip | Laterality: Right

## 2017-02-23 MED ORDER — MENTHOL 3 MG MT LOZG: 1.0000 | LOZENGE | OROMUCOSAL | Status: DC | PRN

## 2017-02-23 MED ORDER — LACTATED RINGERS IV SOLN
INTRAVENOUS | Status: DC
  Administered 2017-02-23 (×2): via INTRAVENOUS

## 2017-02-23 MED ORDER — METHOCARBAMOL 500 MG PO TABS: 500.0000 mg | ORAL_TABLET | Freq: Four times a day (QID) | ORAL | Status: DC | PRN

## 2017-02-23 MED ORDER — DOCUSATE SODIUM 100 MG PO CAPS
100.0000 mg | ORAL_CAPSULE | Freq: Two times a day (BID) | ORAL | Status: DC
  Administered 2017-02-23 – 2017-02-25 (×4): 100 mg via ORAL
  Filled 2017-02-23 (×4): qty 1

## 2017-02-23 MED ORDER — DEXAMETHASONE SODIUM PHOSPHATE 10 MG/ML IJ SOLN
INTRAMUSCULAR | Status: AC
  Filled 2017-02-23: qty 1

## 2017-02-23 MED ORDER — CHLORHEXIDINE GLUCONATE 4 % EX LIQD: 60.0000 mL | Freq: Once | CUTANEOUS | Status: DC

## 2017-02-23 MED ORDER — HYDROCODONE-ACETAMINOPHEN 7.5-325 MG PO TABS: 1.0000 | ORAL_TABLET | ORAL | 0 refills | Status: DC | PRN

## 2017-02-23 MED ORDER — HYDROCODONE-ACETAMINOPHEN 7.5-325 MG PO TABS
1.0000 | ORAL_TABLET | ORAL | Status: DC
  Administered 2017-02-23: 20:00:00 2 via ORAL
  Administered 2017-02-23: 13:00:00 1 via ORAL
  Administered 2017-02-23: 2 via ORAL
  Administered 2017-02-23 – 2017-02-25 (×7): 1 via ORAL
  Filled 2017-02-23 (×2): qty 2
  Filled 2017-02-23 (×8): qty 1
  Filled 2017-02-23: qty 2

## 2017-02-23 MED ORDER — ASPIRIN 81 MG PO CHEW: 81.0000 mg | CHEWABLE_TABLET | Freq: Two times a day (BID) | ORAL | 0 refills | Status: DC

## 2017-02-23 MED ORDER — BISACODYL 10 MG RE SUPP: 10.0000 mg | Freq: Every day | RECTAL | Status: DC | PRN

## 2017-02-23 MED ORDER — TRANEXAMIC ACID 1000 MG/10ML IV SOLN
1000.0000 mg | Freq: Once | INTRAVENOUS | Status: AC
  Administered 2017-02-23: 1000 mg via INTRAVENOUS
  Filled 2017-02-23: qty 1100
  Filled 2017-02-23: qty 10

## 2017-02-23 MED ORDER — PANTOPRAZOLE SODIUM 40 MG PO TBEC
40.0000 mg | DELAYED_RELEASE_TABLET | Freq: Every day | ORAL | Status: DC
  Administered 2017-02-23 – 2017-02-25 (×3): 40 mg via ORAL
  Filled 2017-02-23 (×3): qty 1

## 2017-02-23 MED ORDER — FERROUS SULFATE 325 (65 FE) MG PO TABS
325.0000 mg | ORAL_TABLET | Freq: Three times a day (TID) | ORAL | Status: DC
  Administered 2017-02-24 – 2017-02-25 (×5): 325 mg via ORAL
  Filled 2017-02-23 (×5): qty 1

## 2017-02-23 MED ORDER — PROPOFOL 500 MG/50ML IV EMUL
INTRAVENOUS | Status: DC | PRN
  Administered 2017-02-23: 50 ug/kg/min via INTRAVENOUS

## 2017-02-23 MED ORDER — DEXTROSE 5 % IV SOLN
500.0000 mg | Freq: Four times a day (QID) | INTRAVENOUS | Status: DC | PRN
  Filled 2017-02-23: qty 5

## 2017-02-23 MED ORDER — ONDANSETRON HCL 4 MG PO TABS: 4.0000 mg | ORAL_TABLET | Freq: Four times a day (QID) | ORAL | Status: DC | PRN

## 2017-02-23 MED ORDER — DEXAMETHASONE SODIUM PHOSPHATE 10 MG/ML IJ SOLN
INTRAMUSCULAR | Status: DC | PRN
  Administered 2017-02-23: 10 mg via INTRAVENOUS

## 2017-02-23 MED ORDER — ONDANSETRON HCL 4 MG/2ML IJ SOLN: 4.0000 mg | Freq: Four times a day (QID) | INTRAMUSCULAR | Status: DC | PRN

## 2017-02-23 MED ORDER — DIPHENHYDRAMINE HCL 25 MG PO CAPS: 25.0000 mg | ORAL_CAPSULE | Freq: Four times a day (QID) | ORAL | Status: DC | PRN

## 2017-02-23 MED ORDER — SODIUM CHLORIDE 0.9 % IR SOLN
Status: DC | PRN
  Administered 2017-02-23: 1000 mL

## 2017-02-23 MED ORDER — FENTANYL CITRATE (PF) 100 MCG/2ML IJ SOLN
INTRAMUSCULAR | Status: DC | PRN
  Administered 2017-02-23: 25 ug via INTRAVENOUS

## 2017-02-23 MED ORDER — METHOCARBAMOL 500 MG PO TABS: 500.0000 mg | ORAL_TABLET | Freq: Four times a day (QID) | ORAL | 0 refills | Status: DC | PRN

## 2017-02-23 MED ORDER — MAGNESIUM CITRATE PO SOLN: 1.0000 | Freq: Once | ORAL | Status: DC | PRN

## 2017-02-23 MED ORDER — FENTANYL CITRATE (PF) 100 MCG/2ML IJ SOLN: 25.0000 ug | INTRAMUSCULAR | Status: DC | PRN

## 2017-02-23 MED ORDER — BISOPROLOL-HYDROCHLOROTHIAZIDE 5-6.25 MG PO TABS
1.0000 | ORAL_TABLET | Freq: Every day | ORAL | Status: DC
  Administered 2017-02-23 – 2017-02-25 (×3): 1 via ORAL
  Filled 2017-02-23 (×3): qty 1

## 2017-02-23 MED ORDER — PHENYLEPHRINE HCL 10 MG/ML IJ SOLN
INTRAMUSCULAR | Status: DC | PRN
  Administered 2017-02-23: 25 ug/min via INTRAVENOUS

## 2017-02-23 MED ORDER — EPHEDRINE 5 MG/ML INJ
INTRAVENOUS | Status: AC
  Filled 2017-02-23: qty 10

## 2017-02-23 MED ORDER — PHENYLEPHRINE HCL 10 MG/ML IJ SOLN
INTRAMUSCULAR | Status: AC
  Filled 2017-02-23: qty 1

## 2017-02-23 MED ORDER — TRANEXAMIC ACID 1000 MG/10ML IV SOLN
1000.0000 mg | INTRAVENOUS | Status: AC
  Administered 2017-02-23: 1000 mg via INTRAVENOUS
  Filled 2017-02-23: qty 1100

## 2017-02-23 MED ORDER — ONDANSETRON HCL 4 MG/2ML IJ SOLN
INTRAMUSCULAR | Status: DC | PRN
  Administered 2017-02-23: 4 mg via INTRAVENOUS

## 2017-02-23 MED ORDER — DOCUSATE SODIUM 100 MG PO CAPS: 100.0000 mg | ORAL_CAPSULE | Freq: Two times a day (BID) | ORAL | 0 refills | Status: DC

## 2017-02-23 MED ORDER — MELOXICAM 15 MG PO TABS: 15.0000 mg | ORAL_TABLET | Freq: Every day | ORAL | 2 refills | Status: DC

## 2017-02-23 MED ORDER — FENTANYL CITRATE (PF) 100 MCG/2ML IJ SOLN
INTRAMUSCULAR | Status: AC
  Filled 2017-02-23: qty 2

## 2017-02-23 MED ORDER — ASPIRIN 81 MG PO CHEW
81.0000 mg | CHEWABLE_TABLET | Freq: Two times a day (BID) | ORAL | Status: DC
  Administered 2017-02-23 – 2017-02-25 (×4): 81 mg via ORAL
  Filled 2017-02-23 (×4): qty 1

## 2017-02-23 MED ORDER — POLYETHYLENE GLYCOL 3350 17 G PO PACK
17.0000 g | PACK | Freq: Two times a day (BID) | ORAL | Status: DC
  Administered 2017-02-24 – 2017-02-25 (×2): 17 g via ORAL
  Filled 2017-02-23 (×2): qty 1

## 2017-02-23 MED ORDER — PROPOFOL 10 MG/ML IV BOLUS
INTRAVENOUS | Status: AC
  Filled 2017-02-23: qty 40

## 2017-02-23 MED ORDER — FERROUS SULFATE 325 (65 FE) MG PO TABS: 325.0000 mg | ORAL_TABLET | Freq: Three times a day (TID) | ORAL | Status: DC

## 2017-02-23 MED ORDER — VANCOMYCIN HCL IN DEXTROSE 1-5 GM/200ML-% IV SOLN
1000.0000 mg | INTRAVENOUS | Status: AC
  Administered 2017-02-23: 1000 mg via INTRAVENOUS

## 2017-02-23 MED ORDER — PHENOL 1.4 % MT LIQD: 1.0000 | OROMUCOSAL | Status: DC | PRN

## 2017-02-23 MED ORDER — ONDANSETRON HCL 4 MG/2ML IJ SOLN: 4.0000 mg | Freq: Once | INTRAMUSCULAR | Status: DC | PRN

## 2017-02-23 MED ORDER — ALUM & MAG HYDROXIDE-SIMETH 200-200-20 MG/5ML PO SUSP: 15.0000 mL | ORAL | Status: DC | PRN

## 2017-02-23 MED ORDER — VANCOMYCIN HCL IN DEXTROSE 1-5 GM/200ML-% IV SOLN
1000.0000 mg | Freq: Two times a day (BID) | INTRAVENOUS | Status: AC
  Administered 2017-02-23: 18:00:00 1000 mg via INTRAVENOUS
  Filled 2017-02-23: qty 200

## 2017-02-23 MED ORDER — VANCOMYCIN HCL IN DEXTROSE 1-5 GM/200ML-% IV SOLN
INTRAVENOUS | Status: AC
  Filled 2017-02-23: qty 200

## 2017-02-23 MED ORDER — DEXAMETHASONE SODIUM PHOSPHATE 10 MG/ML IJ SOLN
10.0000 mg | Freq: Once | INTRAMUSCULAR | Status: AC
  Administered 2017-02-24: 08:00:00 10 mg via INTRAVENOUS
  Filled 2017-02-23: qty 1

## 2017-02-23 MED ORDER — BUPIVACAINE IN DEXTROSE 0.75-8.25 % IT SOLN
INTRATHECAL | Status: DC | PRN
  Administered 2017-02-23: 2 mL via INTRATHECAL

## 2017-02-23 MED ORDER — LORAZEPAM 0.5 MG PO TABS
0.5000 mg | ORAL_TABLET | Freq: Two times a day (BID) | ORAL | Status: DC
  Administered 2017-02-23 – 2017-02-25 (×3): 0.5 mg via ORAL
  Filled 2017-02-23 (×3): qty 1

## 2017-02-23 MED ORDER — ONDANSETRON HCL 4 MG/2ML IJ SOLN
INTRAMUSCULAR | Status: AC
  Filled 2017-02-23: qty 2

## 2017-02-23 MED ORDER — EPHEDRINE SULFATE 50 MG/ML IJ SOLN
INTRAMUSCULAR | Status: DC | PRN
  Administered 2017-02-23 (×3): 10 mg via INTRAVENOUS

## 2017-02-23 MED ORDER — METOCLOPRAMIDE HCL 5 MG/ML IJ SOLN: 5.0000 mg | Freq: Three times a day (TID) | INTRAMUSCULAR | Status: DC | PRN

## 2017-02-23 MED ORDER — KETOTIFEN FUMARATE 0.025 % OP SOLN
1.0000 [drp] | OPHTHALMIC | Status: DC | PRN
Start: 2017-02-23 — End: 2017-02-25

## 2017-02-23 MED ORDER — SODIUM CHLORIDE 0.9 % IV SOLN
INTRAVENOUS | Status: DC
  Administered 2017-02-23: 12:00:00 via INTRAVENOUS

## 2017-02-23 MED ORDER — POLYETHYLENE GLYCOL 3350 17 G PO PACK: 17.0000 g | PACK | Freq: Two times a day (BID) | ORAL | 0 refills | Status: DC

## 2017-02-23 MED ORDER — METOCLOPRAMIDE HCL 5 MG PO TABS: 5.0000 mg | ORAL_TABLET | Freq: Three times a day (TID) | ORAL | Status: DC | PRN

## 2017-02-23 SURGICAL SUPPLY — 36 items
BAG DECANTER FOR FLEXI CONT (MISCELLANEOUS) IMPLANT
BAG ZIPLOCK 12X15 (MISCELLANEOUS) IMPLANT
BLADE SAG 18X100X1.27 (BLADE) ×3 IMPLANT
CAPT HIP TOTAL 2 ×3 IMPLANT
CLOTH BEACON ORANGE TIMEOUT ST (SAFETY) ×3 IMPLANT
COVER PERINEAL POST (MISCELLANEOUS) ×3 IMPLANT
COVER SURGICAL LIGHT HANDLE (MISCELLANEOUS) ×3 IMPLANT
DERMABOND ADVANCED (GAUZE/BANDAGES/DRESSINGS) ×2
DERMABOND ADVANCED .7 DNX12 (GAUZE/BANDAGES/DRESSINGS) ×1 IMPLANT
DRAPE STERI IOBAN 125X83 (DRAPES) ×3 IMPLANT
DRAPE U-SHAPE 47X51 STRL (DRAPES) ×6 IMPLANT
DRESSING AQUACEL AG SP 3.5X10 (GAUZE/BANDAGES/DRESSINGS) ×1 IMPLANT
DRSG AQUACEL AG ADV 3.5X10 (GAUZE/BANDAGES/DRESSINGS) ×3 IMPLANT
DRSG AQUACEL AG SP 3.5X10 (GAUZE/BANDAGES/DRESSINGS) ×3
DURAPREP 26ML APPLICATOR (WOUND CARE) ×3 IMPLANT
ELECT REM PT RETURN 15FT ADLT (MISCELLANEOUS) ×3 IMPLANT
GLOVE BIOGEL M STRL SZ7.5 (GLOVE) IMPLANT
GLOVE BIOGEL PI IND STRL 7.5 (GLOVE) ×5 IMPLANT
GLOVE BIOGEL PI IND STRL 8.5 (GLOVE) ×1 IMPLANT
GLOVE BIOGEL PI INDICATOR 7.5 (GLOVE) ×10
GLOVE BIOGEL PI INDICATOR 8.5 (GLOVE) ×2
GLOVE ECLIPSE 8.0 STRL XLNG CF (GLOVE) ×6 IMPLANT
GLOVE ORTHO TXT STRL SZ7.5 (GLOVE) ×3 IMPLANT
GOWN STRL REUS W/TWL LRG LVL3 (GOWN DISPOSABLE) ×9 IMPLANT
GOWN STRL REUS W/TWL XL LVL3 (GOWN DISPOSABLE) ×3 IMPLANT
HOLDER FOLEY CATH W/STRAP (MISCELLANEOUS) ×3 IMPLANT
PACK ANTERIOR HIP CUSTOM (KITS) ×3 IMPLANT
SUT MNCRL AB 4-0 PS2 18 (SUTURE) ×3 IMPLANT
SUT STRATAFIX 0 PDS 27 VIOLET (SUTURE) ×3
SUT VIC AB 1 CT1 36 (SUTURE) ×9 IMPLANT
SUT VIC AB 2-0 CT1 27 (SUTURE) ×4
SUT VIC AB 2-0 CT1 TAPERPNT 27 (SUTURE) ×2 IMPLANT
SUTURE STRATFX 0 PDS 27 VIOLET (SUTURE) ×1 IMPLANT
TRAY FOLEY CATH 14FR (SET/KITS/TRAYS/PACK) ×3 IMPLANT
WATER STERILE IRR 1500ML POUR (IV SOLUTION) ×3 IMPLANT
YANKAUER SUCT BULB TIP 10FT TU (MISCELLANEOUS) IMPLANT

## 2017-02-23 NOTE — Transfer of Care (Signed)
Immediate Anesthesia Transfer of Care Note  Patient: Megan Olsen  Procedure(s) Performed: Procedure(s): RIGHT TOTAL HIP ARTHROPLASTY ANTERIOR APPROACH (Right)  Patient Location: PACU  Anesthesia Type:MAC and Spinal  Level of Consciousness: awake, alert , oriented and patient cooperative  Airway & Oxygen Therapy: Patient Spontanous Breathing and Patient connected to face mask oxygen  Post-op Assessment: Report given to RN and Post -op Vital signs reviewed and stable  Post vital signs: Reviewed and stable  Last Vitals:  Vitals:   02/23/17 0522  BP: (!) 180/69  Pulse: 75  Resp: 16  Temp: (!) 36.4 C    Last Pain:  Vitals:   02/23/17 0540  TempSrc:   PainSc: 8          Complications: No apparent anesthesia complications

## 2017-02-23 NOTE — Interval H&P Note (Signed)
History and Physical Interval Note:  02/23/2017 6:49 AM  Megan Olsen  has presented today for surgery, with the diagnosis of Right hip AVN  The various methods of treatment have been discussed with the patient and family. After consideration of risks, benefits and other options for treatment, the patient has consented to  Procedure(s): RIGHT TOTAL HIP ARTHROPLASTY ANTERIOR APPROACH (Right) as a surgical intervention .  The patient's history has been reviewed, patient examined, no change in status, stable for surgery.  I have reviewed the patient's chart and labs.  Questions were answered to the patient's satisfaction.     Mauri Pole

## 2017-02-23 NOTE — Telephone Encounter (Signed)
Medication were refilled.  

## 2017-02-23 NOTE — Progress Notes (Signed)
Portable AP Pelvis and Lateral Right Hip X-rays done. 

## 2017-02-23 NOTE — Evaluation (Signed)
Physical Therapy Evaluation Patient Details Name: Megan Olsen MRN: [id number] DOB: 10-Dec-1927 Today's Date: 02/23/2017   History of Present Illness  R THA- direct anterior  Clinical Impression  The patient  Tolerated  Ambulating 10' today. The patient reports that she has no family to assist her at DC. She has been to [facility] in past . Pt admitted with above diagnosis. Pt currently with functional limitations due to the deficits listed below (see PT Problem List). Pt will benefit from skilled PT to increase their independence and safety with mobility to allow discharge to the venue listed below.       Follow Up Recommendations SNF;Supervision/Assistance - 24 hour    Equipment Recommendations  None recommended by PT    Recommendations for Other Services OT consult     Precautions / Restrictions Precautions Precautions: Fall      Mobility  Bed Mobility Overal bed mobility: Needs Assistance Bed Mobility: Supine to Sit     Supine to sit: Min assist     General bed mobility comments: assist with right leg and trunk  Transfers Overall transfer level: Needs assistance Equipment used: Rolling walker (2 wheeled) Transfers: Sit to/from Stand Sit to Stand: Min assist         General transfer comment: cues for hand and right leg position  Ambulation/Gait Ambulation/Gait assistance: Min assist Ambulation Distance (Feet): 10 Feet Assistive device: Rolling walker (2 wheeled) Gait Pattern/deviations: Step-to pattern;Decreased step length - right;Decreased stance time - right;Antalgic     General Gait Details: the patient reports some dizziness upon standing. Limited ambualtion today.  Stairs            Wheelchair Mobility    Modified Rankin (Stroke Patients Only)       Balance                                             Pertinent Vitals/Pain Pain Assessment: 0-10 Pain Score: 2  Pain Location: right hip Pain Descriptors /  Indicators: Sore Pain Intervention(s): Monitored during session;Premedicated before session;Repositioned;Ice applied    Home Living Family/patient expects to be discharged to:: Private residence Living Arrangements: Alone   Type of Home: Apartment Home Access: Stairs to enter   [employer] of Steps: 1 Home Layout: One level Home Equipment: Environmental consultant - 2 wheels Additional Comments: has neighbors and niece who are unable to assist. Patient reports that she wants to go  to rehab and mentioned to MD.    Prior Function Level of Independence: Independent with assistive device(s)         Comments: did not drive due to DJD of hip     Hand Dominance        Extremity/Trunk Assessment   Upper Extremity Assessment Upper Extremity Assessment: Defer to OT evaluation    Lower Extremity Assessment Lower Extremity Assessment: RLE deficits/detail RLE Deficits / Details: able to bear weight and advance the leg    Cervical / Trunk Assessment Cervical / Trunk Assessment: Kyphotic  Communication   Communication: No difficulties  Cognition Arousal/Alertness: Awake/alert Behavior During Therapy: WFL for tasks assessed/performed Overall Cognitive Status: Within Functional Limits for tasks assessed  General Comments      Exercises Total Joint Exercises Ankle Circles/Pumps: AROM;Both;10 reps   Assessment/Plan    PT Assessment Patient needs continued PT services  PT Problem List Decreased strength;Decreased range of motion;Decreased activity tolerance;Decreased safety awareness;Decreased balance;Decreased mobility;Decreased knowledge of precautions;Decreased knowledge of use of DME;Pain       PT Treatment Interventions DME instruction;Gait training;Stair training;Functional mobility training;Therapeutic activities;Therapeutic exercise;Patient/family education    PT Goals (Current goals can be found in the Care Plan  section)  Acute Rehab PT Goals Patient Stated Goal: to walk without pain, go to rehab PT Goal Formulation: With patient Time For Goal Achievement: 02/27/17 Potential to Achieve Goals: Good    Frequency 7X/week   Barriers to discharge Decreased caregiver support patient has no family to support her    Co-evaluation               AM-PAC PT "6 Clicks" Daily Activity  Outcome Measure Difficulty turning over in bed (including adjusting bedclothes, sheets and blankets)?: Total Difficulty moving from lying on back to sitting on the side of the bed? : Total Difficulty sitting down on and standing up from a chair with arms (e.g., wheelchair, bedside commode, etc,.)?: Total Help needed moving to and from a bed to chair (including a wheelchair)?: Total Help needed walking in hospital room?: Total Help needed climbing 3-5 steps with a railing? : Total 6 Click Score: 6    End of Session Equipment Utilized During Treatment: Gait belt Activity Tolerance: Patient tolerated treatment well Patient left: in chair;with call bell/phone within reach;with chair alarm set Nurse Communication: Mobility status PT Visit Diagnosis: Difficulty in walking, not elsewhere classified (R26.2)    Time: 2330-0762 PT Time Calculation (min) (ACUTE ONLY): 21 min   Charges:   PT Evaluation $PT Eval Low Complexity: 1 Low     PT G CodesTresa Endo PT [phone]   Claretha Cooper 02/23/2017, 5:26 PM

## 2017-02-23 NOTE — Anesthesia Procedure Notes (Addendum)
Spinal  Patient location during procedure: OR Start time: 02/23/2017 7:16 AM End time: 02/23/2017 7:18 AM Staffing Anesthesiologist: Catalina Gravel Performed: anesthesiologist  Preanesthetic Checklist Completed: patient identified, surgical consent, pre-op evaluation, timeout performed, IV checked, risks and benefits discussed and monitors and equipment checked Spinal Block Patient position: sitting Prep: site prepped and draped and DuraPrep Patient monitoring: continuous pulse ox and blood pressure Approach: midline Location: L3-4 Injection technique: single-shot Needle Needle type: Pencan  Needle gauge: 24 G Assessment Sensory level: T8 Additional Notes Functioning IV was confirmed and monitors were applied. Sterile prep and drape, including hand hygiene, mask and sterile gloves were used. The patient was positioned and the spine was prepped. The skin was anesthetized with lidocaine.  Free flow of clear CSF was obtained prior to injecting local anesthetic into the CSF.  The spinal needle aspirated freely following injection.  The needle was carefully withdrawn.  The patient tolerated the procedure well. Consent was obtained prior to procedure with all questions answered and concerns addressed. Risks including but not limited to bleeding, infection, nerve damage, paralysis, failed block, inadequate analgesia, allergic reaction, high spinal, itching and headache were discussed and the patient wished to proceed.   Hoy Morn, MD

## 2017-02-23 NOTE — Anesthesia Postprocedure Evaluation (Signed)
Anesthesia Post Note  Patient: Megan Olsen  Procedure(s) Performed: Procedure(s) (LRB): RIGHT TOTAL HIP ARTHROPLASTY ANTERIOR APPROACH (Right)     Patient location during evaluation: PACU Anesthesia Type: Spinal Level of consciousness: oriented and awake and alert Pain management: pain level controlled Vital Signs Assessment: post-procedure vital signs reviewed and stable Respiratory status: spontaneous breathing and respiratory function stable Cardiovascular status: blood pressure returned to baseline and stable Postop Assessment: no headache, no backache, spinal receding, no signs of nausea or vomiting and patient able to bend at knees Anesthetic complications: no    Last Vitals:  Vitals:   02/23/17 1237 02/23/17 1334  BP: (!) 143/60 (!) 143/68  Pulse: (!) 50 60  Resp: 14 14  Temp: 36.8 C (!) 36.2 C    Last Pain:  Vitals:   02/23/17 1334  TempSrc: Oral  PainSc:                  Catalina Gravel

## 2017-02-23 NOTE — Anesthesia Preprocedure Evaluation (Addendum)
Anesthesia Evaluation  Patient identified by MRN, date of birth, ID band Patient awake    Reviewed: Allergy & Precautions, H&P , NPO status , Patient's Chart, lab work & pertinent test results, reviewed documented beta blocker date and time   History of Anesthesia Complications Negative for: history of anesthetic complications  Airway Mallampati: II  TM Distance: >3 FB Neck ROM: Full    Dental no notable dental hx. (+) Dental Advisory Given, Poor Dentition   Pulmonary neg pulmonary ROS,    Pulmonary exam normal breath sounds clear to auscultation       Cardiovascular hypertension, Pt. on medications and Pt. on home beta blockers + dysrhythmias (s/p ablation) Atrial Fibrillation + Valvular Problems/Murmurs MR  Rhythm:Irregular Rate:Normal     Neuro/Psych PSYCHIATRIC DISORDERS Anxiety Depression CVA, No Residual Symptoms    GI/Hepatic Neg liver ROS, GERD  Medicated and Controlled,  Endo/Other  negative endocrine ROSHypothyroidism   Renal/GU negative Renal ROS  negative genitourinary   Musculoskeletal  (+) Arthritis , Osteoarthritis,    Abdominal   Peds negative pediatric ROS (+)  Hematology  (+) Blood dyscrasia, anemia , Plt 191k   Anesthesia Other Findings Day of surgery medications reviewed with the patient.  Reproductive/Obstetrics negative OB ROS                            Anesthesia Physical  Anesthesia Plan  ASA: III  Anesthesia Plan: Spinal   Post-op Pain Management:    Induction:   PONV Risk Score and Plan: 2 and Ondansetron and Dexamethasone  Airway Management Planned:   Additional Equipment:   Intra-op Plan:   Post-operative Plan:   Informed Consent: I have reviewed the patients History and Physical, chart, labs and discussed the procedure including the risks, benefits and alternatives for the proposed anesthesia with the patient or authorized representative who has  indicated his/her understanding and acceptance.   Dental advisory given  Plan Discussed with: CRNA  Anesthesia Plan Comments: (Discussed risks and benefits of and differences between spinal and general. Discussed risks of spinal including headache, backache, failure, bleeding, infection, and nerve damage. Patient consents to spinal. Questions answered. Coagulation studies and platelet count acceptable.)        Anesthesia Quick Evaluation

## 2017-02-23 NOTE — Discharge Instructions (Signed)

## 2017-02-23 NOTE — Op Note (Addendum)
NAME:  Megan Olsen                ACCOUNT NO.: [id number]      MEDICAL RECORD NO.: [id number]      FACILITY:  [facility]      PHYSICIAN:  Paralee Cancel D  DATE OF BIRTH:  April 02, 1928     DATE OF PROCEDURE:  02/23/2017                                 OPERATIVE REPORT         PREOPERATIVE DIAGNOSIS: Right  hip osteoarthritis.      POSTOPERATIVE DIAGNOSIS:  Right hip osteoarthritis.      PROCEDURE:  Right total hip replacement through an anterior approach   utilizing DePuy THR system, component size 57mm pinnacle cup, a size 36+4 neutral   Altrex liner, a size 2 Hi Tri Lock stem with a 36+1.5 delta ceramic   ball.      SURGEON:  Pietro Cassis. Alvan Dame, M.D.      ASSISTANT:  Danae Orleans, PA-C     ANESTHESIA:  Spinal.      SPECIMENS:  None.      COMPLICATIONS:  None.      BLOOD LOSS:  150 cc     DRAINS:  None.      INDICATION OF THE PROCEDURE:  Megan Olsen is a 83 y.o. female who had   presented to office for evaluation of right hip pain.  Radiographs revealed   progressive degenerative changes with bone-on-bone   articulation to the  hip joint.  The patient had painful limited range of   motion significantly affecting their overall quality of life.  The patient was failing to    respond to conservative measures, and at this point was ready   to proceed with more definitive measures.  The patient has noted progressive   degenerative changes in his hip, progressive problems and dysfunction   with regarding the hip prior to surgery.  Consent was obtained for   benefit of pain relief.  Specific risk of infection, DVT, component   failure, dislocation, need for revision surgery, as well discussion of   the anterior versus posterior approach were reviewed.  Consent was   obtained for benefit of anterior pain relief through an anterior   approach.      PROCEDURE IN DETAIL:  The patient was brought to operative theater.   Once adequate anesthesia,  preoperative antibiotics, 1 gm of Vancomycin, 1 gm of Tranexamic Acid, and 10 mg of Decadron administered.   The patient was positioned supine on the OSI Hanna table.  Once adequate   padding of boney process was carried out, we had predraped out the hip, and  used fluoroscopy to confirm orientation of the pelvis and position.      The right hip was then prepped and draped from proximal iliac crest to   mid thigh with shower curtain technique.      Time-out was performed identifying the patient, planned procedure, and   extremity.     An incision was then made 2 cm distal and lateral to the   anterior superior iliac spine extending over the orientation of the   tensor fascia lata muscle and sharp dissection was carried down to the   fascia of the muscle and protractor placed in the soft tissues.      The fascia  was then incised.  The muscle belly was identified and swept   laterally and retractor placed along the superior neck.  Following   cauterization of the circumflex vessels and removing some pericapsular   fat, a second cobra retractor was placed on the inferior neck.  A third   retractor was placed on the anterior acetabulum after elevating the   anterior rectus.  A L-capsulotomy was along the line of the   superior neck to the trochanteric fossa, then extended proximally and   distally.  Tag sutures were placed and the retractors were then placed   intracapsular.  We then identified the trochanteric fossa and   orientation of my neck cut, confirmed this radiographically   and then made a neck osteotomy with the femur on traction.  The femoral   head was removed without difficulty or complication.  Traction was let   off and retractors were placed posterior and anterior around the   acetabulum.      The labrum and foveal tissue were debrided.  I began reaming with a 54mm   reamer and reamed up to 83mm reamer with good bony bed preparation and a 90mm   cup was chosen.  The  final 48mm Pinnacle cup was then impacted under fluoroscopy  to confirm the depth of penetration and orientation with respect to   abduction.  A screw was placed followed by the hole eliminator.  The final   36+4 neutral Altrex liner was impacted with good visualized rim fit.  The cup was positioned anatomically within the acetabular portion of the pelvis.      At this point, the femur was rolled at 80 degrees.  Further capsule was   released off the inferior aspect of the femoral neck.  I then   released the superior capsule proximally.  The hook was placed laterally   along the femur and elevated manually and held in position with the bed   hook.  The leg was then extended and adducted with the leg rolled to 100   degrees of external rotation.  Once the proximal femur was fully   exposed, I used a box osteotome to set orientation.  I then began   broaching with the starting chili pepper broach and passed this by hand and then broached up to 2.  With the 2 broach in place I chose a high offset neck and did several trial reductions.  The offset was appropriate, leg lengths   appeared to be equal best matched with the +1.5 head ball confirmed radiographically.   Given these findings, I went ahead and dislocated the hip, repositioned all   retractors and positioned the right hip in the extended and abducted position.  The final 2 Hi Tri Lock stem was   chosen and it was impacted down to the level of neck cut.  Based on this   and the trial reduction, a 36+1.5 delta ceramic ball was chosen and   impacted onto a clean and dry trunnion, and the hip was reduced.  The   hip had been irrigated throughout the case again at this point.  I did   reapproximate the superior capsular leaflet to the anterior leaflet   using #1 Vicryl.  The fascia of the   tensor fascia lata muscle was then reapproximated using #1 Vicryl and #0 V-lock sutures.  The   remaining wound was closed with 2-0 Vicryl and running 4-0  Monocryl.   The hip was cleaned, dried,  and dressed sterilely using Dermabond and   Aquacel dressing.  She was then brought   to recovery room in stable condition tolerating the procedure well.    Danae Orleans, PA-C was present for the entirety of the case involved from   preoperative positioning, perioperative retractor management, general   facilitation of the case, as well as primary wound closure as assistant.            Pietro Cassis Alvan Dame, M.D.        02/23/2017 8:22 AM

## 2017-02-24 ENCOUNTER — Encounter (HOSPITAL_COMMUNITY): Payer: Self-pay | Admitting: Orthopedic Surgery

## 2017-02-24 LAB — BASIC METABOLIC PANEL
Anion gap: 7 (ref 5–15)
BUN: 17 mg/dL (ref 6–20)
CO2: 26 mmol/L (ref 22–32)
CREATININE: 0.64 mg/dL (ref 0.44–1.00)
Calcium: 8.4 mg/dL — ABNORMAL LOW (ref 8.9–10.3)
Chloride: 108 mmol/L (ref 101–111)
Glucose, Bld: 93 mg/dL (ref 65–99)
Potassium: 3.9 mmol/L (ref 3.5–5.1)
SODIUM: 141 mmol/L (ref 135–145)

## 2017-02-24 LAB — CBC
HCT: 29.4 % — ABNORMAL LOW (ref 36.0–46.0)
Hemoglobin: 9.9 g/dL — ABNORMAL LOW (ref 12.0–15.0)
MCH: 30.6 pg (ref 26.0–34.0)
MCHC: 33.7 g/dL (ref 30.0–36.0)
MCV: 90.7 fL (ref 78.0–100.0)
PLATELETS: 137 10*3/uL — AB (ref 150–400)
RBC: 3.24 MIL/uL — AB (ref 3.87–5.11)
RDW: 15.4 % (ref 11.5–15.5)
WBC: 9.9 10*3/uL (ref 4.0–10.5)

## 2017-02-24 NOTE — Evaluation (Signed)
Occupational Therapy Evaluation Patient Details Name: Megan Olsen MRN: [id number] DOB: [birth year] Today's Date: 02/24/2017    History of Present Illness R THA- direct anterior   Clinical Impression   This 83 year old female was admitted for the above sx. She was mod I for basic adls prior to admission and lives alone. She will benefit from continued OT in acute setting as well as follow up OT at SNF. Goals in acute are for supervision level    Follow Up Recommendations  SNF    Equipment Recommendations  3 in 1 bedside commode    Recommendations for Other Services       Precautions / Restrictions Precautions Precautions: Fall Restrictions Weight Bearing Restrictions: No      Mobility Bed Mobility         Supine to sit: Min assist     General bed mobility comments: assist for RLE; HOB raised  Transfers Overall transfer level: Needs assistance Equipment used: Rolling walker (2 wheeled)   Sit to Stand: Min assist         General transfer comment: cues for hand and right leg position    Balance                                           ADL either performed or assessed with clinical judgement   ADL Overall ADL's : Needs assistance/impaired     Grooming: Oral care;Supervision/safety;Standing   Upper Body Bathing: Set up;Sitting   Lower Body Bathing: Minimal assistance;Sit to/from stand   Upper Body Dressing : Set up;Sitting   Lower Body Dressing: Maximal assistance;Sit to/from stand   Toilet Transfer: Minimal assistance;Ambulation;RW (to chair)   Toileting- Clothing Manipulation and Hygiene: Minimal assistance;Sit to/from stand         General ADL Comments: completed adl from EOB, stood at sink for teeth. Pt just had catheter removed prior to OT     Vision         Perception     Praxis      Pertinent Vitals/Pain Pain Assessment: 0-10 Pain Score: 2  Pain Location: right hip Pain Descriptors / Indicators:  Sore Pain Intervention(s): Limited activity within patient's tolerance;Monitored during session;Premedicated before session;Repositioned;Ice applied     Hand Dominance     Extremity/Trunk Assessment Upper Extremity Assessment Upper Extremity Assessment: Overall WFL for tasks assessed           Communication Communication Communication: No difficulties   Cognition Arousal/Alertness: Awake/alert Behavior During Therapy: WFL for tasks assessed/performed Overall Cognitive Status: Within Functional Limits for tasks assessed                                     General Comments       Exercises     Shoulder Instructions      Home Living Family/patient expects to be discharged to:: Private residence Living Arrangements: Alone                                      Prior Functioning/Environment Level of Independence: Independent with assistive device(s)        Comments: did not drive due to DJD of hip.  Neighbor took trash out  OT Problem List: Decreased strength;Decreased activity tolerance;Decreased knowledge of use of DME or AE;Pain      OT Treatment/Interventions: Self-care/ADL training;DME and/or AE instruction;Patient/family education    OT Goals(Current goals can be found in the care plan section) Acute Rehab OT Goals Patient Stated Goal: to walk without pain, go to rehab OT Goal Formulation: With patient Time For Goal Achievement: 03/03/17 Potential to Achieve Goals: Good ADL Goals Pt Will Perform Lower Body Bathing: with supervision;sit to/from stand;with adaptive equipment Pt Will Perform Lower Body Dressing: with supervision;with adaptive equipment;sit to/from stand Pt Will Transfer to Toilet: with supervision;ambulating;bedside commode Pt Will Perform Toileting - Clothing Manipulation and hygiene: with supervision;sit to/from stand  OT Frequency: Min 2X/week   Barriers to D/C:            Co-evaluation               AM-PAC PT "6 Clicks" Daily Activity     Outcome Measure Help from another person eating meals?: None Help from another person taking care of personal grooming?: A Little Help from another person toileting, which includes using toliet, bedpan, or urinal?: A Little Help from another person bathing (including washing, rinsing, drying)?: A Little Help from another person to put on and taking off regular upper body clothing?: A Little Help from another person to put on and taking off regular lower body clothing?: A Lot 6 Click Score: 18   End of Session    Activity Tolerance: Patient tolerated treatment well Patient left: in chair;with call bell/phone within reach;with chair alarm set  OT Visit Diagnosis: Pain Pain - Right/Left: Right Pain - part of body: Hip                Time: 1572-6203 OT Time Calculation (min): 25 min Charges:  OT General Charges $OT Visit: 1 Procedure OT Evaluation $OT Eval Low Complexity: 1 Procedure OT Treatments $Self Care/Home Management : 8-22 mins G-Codes:     [place], OTR/L [phone] 02/24/2017  Megan Olsen 02/24/2017, 9:36 AM

## 2017-02-24 NOTE — Clinical Social Work Note (Signed)
Clinical Social Work Assessment  Patient Details  Name: Megan Olsen MRN: [id number] Date of Birth: [birth year]  Date of referral:  02/24/17               Reason for consult:  Discharge Planning                Permission sought to share information with:  Chartered certified accountant granted to share information::  Yes, Verbal Permission Granted  Name::        Agency::     Relationship::     Contact Information:     Housing/Transportation Living arrangements for the past 2 months:  Single Family Home Source of Information:  Patient Patient Interpreter Needed:  None Criminal Activity/Legal Involvement Pertinent to Current Situation/Hospitalization:  No - Comment as needed Significant Relationships:  Neighbor, Friend Lives with:  Self Do you feel safe going back to the place where you live?  No (SNF needed.) Need for family participation in patient care:  No (Coment)  Care giving concerns:  Pt lives alone with minimal support from neighbors. Pt requires more care than is available at dc.   Social Worker assessment / plan:  Pt hospitalized from home on 02/23/17 for pre planned Right Total Hip Arthroplasty. PT has recommended SNF at dc. CSW met with pt at bedside this am to review PT recommendations. Pt has requested [facility] for ST Rehab placement. SNF has been contacted, clinicals sent and bed offer received. SNF is assisting with [facility] authorization. CSW will continue to follow to assist with d/c planning needs.  Employment status:  Retired Nurse, adult PT Recommendations:  [place] / Referral to community resources:  [place]  Patient/Family's Response to care:  Pt is in agreement with plan for SNF at [facility].  Patient/Family's Understanding of and Emotional Response to Diagnosis, Current Treatment, and Prognosis: Pt met with PA this am and medical status reviewed. Pt is motivated to  work with therapy and is looking forward to [place] placement at [facility].  Emotional Assessment Appearance:  Appears stated age Attitude/Demeanor/Rapport:  Other (cooperative) Affect (typically observed):  Calm, Appropriate, Pleasant Orientation:  Oriented to Self, Oriented to Place, Oriented to  Time, Oriented to Situation Alcohol / Substance use:  Not Applicable Psych involvement (Current and /or in the community):  No (Comment)  Discharge Needs  Concerns to be addressed:  Discharge Planning Concerns Readmission within the last 30 days:  No Current discharge risk:  None Barriers to Discharge:  No Barriers Identified   Luretha Rued, [place] 02/24/2017, 11:39 AM

## 2017-02-24 NOTE — Progress Notes (Signed)
Plan for d/c to SNF, discharge planning per CSW. [phone] 

## 2017-02-24 NOTE — Progress Notes (Addendum)
     Subjective: 1 Day Post-Op Procedure(s) (LRB): RIGHT TOTAL HIP ARTHROPLASTY ANTERIOR APPROACH (Right)   Patient reports pain as mild, pain controlled.  No events throughout the night. Plan for discharge possibly tomorrow due to underlying medical co-morbidities, pain control and need for inpatient therapy to meet goal of being discharged safely.  Past Medical History:  Diagnosis Date  . Atrial fibrillation ([place])    catheter ablation of SVT in 2002  . CVA (cerebrovascular accident) ([place]) 2003   left brain  . Depression   . GERD (gastroesophageal reflux disease)   . Iron deficiency anemia   . Osteoarthritis   . Osteoporosis   . Right BBB/left ant fasc block      Objective:   VITALS:   Vitals:   02/24/17 0605 02/24/17 0931  BP: (!) 159/59 (!) 156/59  Pulse: 63 (!) 56  Resp: 17 15  Temp: (!) 97.5 F (36.4 C) 97.8 F (36.6 C)    Dorsiflexion/Plantar flexion intact Incision: dressing C/D/I No cellulitis present Compartment soft  LABS  Recent Labs  02/24/17 0650  HGB 9.9*  HCT 29.4*  WBC 9.9  PLT 137*     Recent Labs  02/24/17 0650  NA 141  K 3.9  BUN 17  CREATININE 0.64  GLUCOSE 93     Assessment/Plan: 1 Day Post-Op Procedure(s) (LRB): RIGHT TOTAL HIP ARTHROPLASTY ANTERIOR APPROACH (Right) Foley cath d/c'ed Advance diet Up with therapy D/C IV fluids Discharge to SNF when ready    [place]. Aristea Posada   PAC  02/24/2017, 9:41 AM

## 2017-02-24 NOTE — Clinical Social Work Placement (Signed)
   CLINICAL SOCIAL WORK PLACEMENT  NOTE  Date:  02/24/2017  Patient Details  Name: Megan Olsen MRN: [id number] Date of Birth: [birth year]  Clinical Social Work is seeking post-discharge placement for this patient at the [place] level of care (*CSW will initial, date and re-position this form in  chart as items are completed):  No   Patient/family provided with [place] Work Department's list of facilities offering this level of care within the geographic area requested by the patient (or if unable, by the patient's family).  Yes   Patient/family informed of their freedom to choose among providers that offer the needed level of care, that participate in Medicare, Medicaid or managed care program needed by the patient, have an available bed and are willing to accept the patient.  Yes   Patient/family informed of [facility]'s ownership interest in [facility] and [facility], as well as of the fact that they are under no obligation to receive care at these facilities.  PASRR submitted to EDS on       PASRR number received on       Existing PASRR number confirmed on 02/24/17     FL2 transmitted to all facilities in geographic area requested by pt/family on 02/24/17     FL2 transmitted to all facilities within larger geographic area on       Patient informed that his/her managed care company has contracts with or will negotiate with certain facilities, including the following:        Yes   Patient/family informed of bed offers received.  Patient chooses bed at [facility]     Physician recommends and patient chooses bed at      Patient to be transferred to   on  .  Patient to be transferred to facility by       Patient family notified on   of transfer.  Name of family member notified:        PHYSICIAN       Additional Comment:    _______________________________________________ Luretha Rued, [place] 02/24/2017, 11:51 AM

## 2017-02-24 NOTE — Progress Notes (Signed)
Physical Therapy Treatment Patient Details Name: Megan Olsen MRN: [id number] DOB: [birth year] Today's Date: 02/24/2017    History of Present Illness R THA- direct anterior    PT Comments    POD # 1  Am session Assisted out of recliner to amb to bathroom then a limited distance in hallway.  Very unsteady gait and poor balance.  Limited activity tolerance. Pt will need St Rehab at SNF prior to safely returning to home.   Follow Up Recommendations  SNF;Supervision/Assistance - 24 hour     Equipment Recommendations  None recommended by PT    Recommendations for Other Services       Precautions / Restrictions Precautions Precautions: Fall Restrictions Weight Bearing Restrictions: No    Mobility  Bed Mobility         Supine to sit: Min assist     General bed mobility comments: OOB in recliner  Transfers Overall transfer level: Needs assistance Equipment used: Rolling walker (2 wheeled) Transfers: Sit to/from Stand Sit to Stand: Min assist         General transfer comment: cues for hand and right leg position plus increased time.  Also assisted with toilet transfer requiring extra assist to maintain balance during self peri care.  Unsteady.   Ambulation/Gait Ambulation/Gait assistance: Min assist Ambulation Distance (Feet): 14 Feet Assistive device: Rolling walker (2 wheeled)   Gait velocity: decreased   General Gait Details: very unsteady gait with limited activity tolerance.  Pt required increased time and Min Assist to prevent LOB.  HIGH FALL RISK.    Stairs            Wheelchair Mobility    Modified Rankin (Stroke Patients Only)       Balance                                            Cognition Arousal/Alertness: Awake/alert Behavior During Therapy: WFL for tasks assessed/performed Overall Cognitive Status: Within Functional Limits for tasks assessed                                         Exercises   Total Hip Replacement TE's 10 reps ankle pumps 10 reps knee presses 10 reps heel slides  Followed by ICE    General Comments        Pertinent Vitals/Pain Pain Assessment: 0-10 Pain Score: 4  Pain Location: right hip Pain Descriptors / Indicators: Operative site guarding;Tender Pain Intervention(s): Monitored during session;Repositioned;Ice applied    Home Living Family/patient expects to be discharged to:: Private residence Living Arrangements: Alone                  Prior Function Level of Independence: Independent with assistive device(s)      Comments: did not drive due to DJD of hip.  Neighbor took trash out   PT Goals (current goals can now be found in the care plan section) Acute Rehab PT Goals Patient Stated Goal: to walk without pain, go to rehab Progress towards PT goals: Progressing toward goals    Frequency    7X/week      PT Plan Current plan remains appropriate    Co-evaluation              AM-PAC PT "6 Clicks" Daily Activity  Outcome Measure  Difficulty turning over in bed (including adjusting bedclothes, sheets and blankets)?: Total Difficulty moving from lying on back to sitting on the side of the bed? : Total Difficulty sitting down on and standing up from a chair with arms (e.g., wheelchair, bedside commode, etc,.)?: Total Help needed moving to and from a bed to chair (including a wheelchair)?: Total Help needed walking in hospital room?: Total Help needed climbing 3-5 steps with a railing? : Total 6 Click Score: 6    End of Session Equipment Utilized During Treatment: Gait belt Activity Tolerance: Patient limited by fatigue Patient left: in chair;with call bell/phone within reach Nurse Communication: Mobility status PT Visit Diagnosis: Difficulty in walking, not elsewhere classified (R26.2)     Time: 1030-1055 PT Time Calculation (min) (ACUTE ONLY): 25 min  Charges:  $Gait Training: 8-22  mins $Therapeutic Exercise: 8-22 mins                    G Codes:       Rica Koyanagi  PTA [facility]  Acute  Rehab Pager      [phone]

## 2017-02-24 NOTE — NC FL2 (Signed)
[place] MEDICAID FL2 LEVEL OF CARE SCREENING TOOL     IDENTIFICATION  Patient Name: Megan Olsen Birthdate: 14-Jun-1928 Sex: female Admission Date (Current Location): 02/23/2017  [facility] and [place] Number:  Herbalist and Address:  [facility],  [place] [address], [place]      Provider Number: [id number]  Attending Physician Name and Address:  Paralee Cancel, MD  Relative Name and Phone Number:       Current Level of Care: Hospital Recommended Level of Care: [place] Prior Approval Number:    Date Approved/Denied:   PASRR Number: [id number]  Discharge Plan: SNF    Current Diagnoses: Patient Active Problem List   Diagnosis Date Noted  . S/P right THA, AA 02/23/2017  . Arthritis of right hip 01/13/2017  . Hypothyroidism 09/07/2014  . A-fib ([place]) 07/06/2014  . Hip fracture, left ([place]) 07/06/2014  . Benign essential HTN 07/06/2014  . GERD (gastroesophageal reflux disease) 07/06/2014  . Anxiety disorder 07/06/2014  . History of stroke without residual deficits 07/06/2014  . Iron deficiency anemia, unspecified 01/02/2013  . ROTATOR CUFF SYNDROME, LEFT 01/07/2010  . Backache 08/08/2008  . CONSTIPATION, INTERMITTENT 08/04/2007  . DEPRESSION 01/11/2007  . Allergic rhinitis 01/11/2007  . Osteoarthritis 01/11/2007  . Osteoporosis 01/11/2007    Orientation RESPIRATION BLADDER Height & Weight     Self, Time, Situation, Place  Normal Indwelling catheter Weight: 48.1 kg (106 lb) Height:  5\' 4"  (162.6 cm)  BEHAVIORAL SYMPTOMS/MOOD NEUROLOGICAL BOWEL NUTRITION STATUS  Other (Comment) (no behaviors)   Continent Diet (Regular)  AMBULATORY STATUS COMMUNICATION OF NEEDS Skin   Limited Assist Verbally Surgical wounds                       Personal Care Assistance Level of Assistance  Bathing, Feeding, Dressing Bathing Assistance: Limited assistance Feeding assistance: Independent Dressing Assistance: Limited  assistance     Functional Limitations Info  Sight, Hearing, Speech Sight Info: Adequate Hearing Info: Adequate Speech Info: Adequate    SPECIAL CARE FACTORS FREQUENCY  PT (By licensed PT), OT (By licensed OT)     PT Frequency: 5x wk OT Frequency: 5x wk            Contractures Contractures Info: Not present    Additional Factors Info  Code Status, Allergies, Psychotropic Code Status Info: Full Code Allergies Info:  Propoxyphene N-acetaminophen, Celecoxib, Fexofenadine, Penicillins Psychotropic Info: ativan         Current Medications (02/24/2017):  This is the current hospital active medication list Current Facility-Administered Medications  Medication Dose Route Frequency Provider Last Rate Last Dose  . 0.9 %  sodium chloride infusion   Intravenous Continuous Danae Orleans, PA-C 100 mL/hr at 02/23/17 1200    . alum & mag hydroxide-simeth (MAALOX/MYLANTA) 200-200-20 MG/5ML suspension 15 mL  15 mL Oral Q4H PRN Danae Orleans, PA-C      . aspirin chewable tablet 81 mg  81 mg Oral BID Danae Orleans, PA-C   81 mg at 02/24/17 0751  . bisacodyl (DULCOLAX) suppository 10 mg  10 mg Rectal Daily PRN Babish, Matthew, PA-C      . bisoprolol-hydrochlorothiazide [facility]) 5-6.25 MG per tablet 1 tablet  1 tablet Oral Daily Danae Orleans, PA-C   1 tablet at 02/24/17 8676  . diphenhydrAMINE (BENADRYL) capsule 25 mg  25 mg Oral Q6H PRN Danae Orleans, PA-C      . docusate sodium (COLACE) capsule 100 mg  100 mg Oral BID  Danae Orleans, PA-C   100 mg at 02/24/17 2122  . fentaNYL (SUBLIMAZE) injection 25-50 mcg  25-50 mcg Intravenous Q2H PRN Danae Orleans, PA-C      . ferrous sulfate tablet 325 mg  325 mg Oral TID PC Danae Orleans, PA-C   325 mg at 02/24/17 0751  . HYDROcodone-acetaminophen (NORCO) 7.5-325 MG per tablet 1-2 tablet  1-2 tablet Oral Q4H Danae Orleans, PA-C   1 tablet at 02/24/17 4825  . ketotifen (ZADITOR) 0.025 % ophthalmic solution 1 drop  1 drop Both Eyes PRN  Danae Orleans, PA-C      . LORazepam (ATIVAN) tablet 0.5 mg  0.5 mg Oral BID Danae Orleans, PA-C   0.5 mg at 02/23/17 1754  . magnesium citrate solution 1 Bottle  1 Bottle Oral Once PRN Babish, Matthew, PA-C      . menthol-cetylpyridinium (CEPACOL) lozenge 3 mg  1 lozenge Oral PRN Danae Orleans, PA-C       Or  . phenol (CHLORASEPTIC) mouth spray 1 spray  1 spray Mouth/Throat PRN Babish, Matthew, PA-C      . methocarbamol (ROBAXIN) tablet 500 mg  500 mg Oral Q6H PRN Danae Orleans, PA-C       Or  . methocarbamol (ROBAXIN) 500 mg in dextrose 5 % 50 mL IVPB  500 mg Intravenous Q6H PRN Babish, Matthew, PA-C      . metoCLOPramide (REGLAN) tablet 5-10 mg  5-10 mg Oral Q8H PRN Danae Orleans, PA-C       Or  . metoCLOPramide (REGLAN) injection 5-10 mg  5-10 mg Intravenous Q8H PRN Babish, Matthew, PA-C      . ondansetron (ZOFRAN) tablet 4 mg  4 mg Oral Q6H PRN Danae Orleans, PA-C       Or  . ondansetron (ZOFRAN) injection 4 mg  4 mg Intravenous Q6H PRN Babish, Matthew, PA-C      . pantoprazole (PROTONIX) EC tablet 40 mg  40 mg Oral Daily Danae Orleans, PA-C   40 mg at 02/24/17 0037  . polyethylene glycol (MIRALAX / GLYCOLAX) packet 17 g  17 g Oral BID Danae Orleans, PA-C         Discharge Medications: Please see discharge summary for a list of discharge medications.  Relevant Imaging Results:  Relevant Lab Results:   Additional Information SS # [id number]  Samanta Gal, Randall An, LCSW

## 2017-02-25 DIAGNOSIS — D509 Iron deficiency anemia, unspecified: Secondary | ICD-10-CM | POA: Diagnosis not present

## 2017-02-25 DIAGNOSIS — K219 Gastro-esophageal reflux disease without esophagitis: Secondary | ICD-10-CM | POA: Diagnosis not present

## 2017-02-25 DIAGNOSIS — R262 Difficulty in walking, not elsewhere classified: Secondary | ICD-10-CM | POA: Diagnosis not present

## 2017-02-25 DIAGNOSIS — F419 Anxiety disorder, unspecified: Secondary | ICD-10-CM | POA: Diagnosis not present

## 2017-02-25 DIAGNOSIS — M6281 Muscle weakness (generalized): Secondary | ICD-10-CM | POA: Diagnosis not present

## 2017-02-25 DIAGNOSIS — R278 Other lack of coordination: Secondary | ICD-10-CM | POA: Diagnosis not present

## 2017-02-25 DIAGNOSIS — Z8673 Personal history of transient ischemic attack (TIA), and cerebral infarction without residual deficits: Secondary | ICD-10-CM | POA: Diagnosis not present

## 2017-02-25 DIAGNOSIS — Z96641 Presence of right artificial hip joint: Secondary | ICD-10-CM | POA: Diagnosis not present

## 2017-02-25 DIAGNOSIS — I10 Essential (primary) hypertension: Secondary | ICD-10-CM | POA: Diagnosis not present

## 2017-02-25 DIAGNOSIS — M1611 Unilateral primary osteoarthritis, right hip: Secondary | ICD-10-CM | POA: Diagnosis not present

## 2017-02-25 DIAGNOSIS — R41841 Cognitive communication deficit: Secondary | ICD-10-CM | POA: Diagnosis not present

## 2017-02-25 DIAGNOSIS — I4891 Unspecified atrial fibrillation: Secondary | ICD-10-CM | POA: Diagnosis not present

## 2017-02-25 DIAGNOSIS — I480 Paroxysmal atrial fibrillation: Secondary | ICD-10-CM | POA: Diagnosis not present

## 2017-02-25 LAB — BASIC METABOLIC PANEL
Anion gap: 6 (ref 5–15)
BUN: 17 mg/dL (ref 6–20)
CALCIUM: 8.6 mg/dL — AB (ref 8.9–10.3)
CHLORIDE: 107 mmol/L (ref 101–111)
CO2: 29 mmol/L (ref 22–32)
CREATININE: 0.62 mg/dL (ref 0.44–1.00)
GFR calc Af Amer: >60 mL/min (ref >60)
GFR calc non Af Amer: >60 mL/min (ref >60)
Glucose, Bld: 92 mg/dL (ref 65–99)
Potassium: 4.3 mmol/L (ref 3.5–5.1)
SODIUM: 142 mmol/L (ref 135–145)

## 2017-02-25 LAB — CBC
HCT: 30 % — ABNORMAL LOW (ref 36.0–46.0)
HEMOGLOBIN: 10 g/dL — AB (ref 12.0–15.0)
MCH: 29.9 pg (ref 26.0–34.0)
MCHC: 33.3 g/dL (ref 30.0–36.0)
MCV: 89.8 fL (ref 78.0–100.0)
PLATELETS: 143 10*3/uL — AB (ref 150–400)
RBC: 3.34 MIL/uL — ABNORMAL LOW (ref 3.87–5.11)
RDW: 15.5 % (ref 11.5–15.5)
WBC: 10.2 10*3/uL (ref 4.0–10.5)

## 2017-02-25 NOTE — Clinical Social Work Placement (Signed)
   CLINICAL SOCIAL WORK PLACEMENT  NOTE  Date:  02/25/2017  Patient Details  Name: Megan Olsen MRN: [id number] Date of Birth: 10-12-1927  Clinical Social Work is seeking post-discharge placement for this patient at the [place] level of care (*CSW will initial, date and re-position this form in  chart as items are completed):  No   Patient/family provided with [place] Work Department's list of facilities offering this level of care within the geographic area requested by the patient (or if unable, by the patient's family).  Yes   Patient/family informed of their freedom to choose among providers that offer the needed level of care, that participate in Medicare, Medicaid or managed care program needed by the patient, have an available bed and are willing to accept the patient.  Yes   Patient/family informed of [facility]'s ownership interest in [facility] and [facility], as well as of the fact that they are under no obligation to receive care at these facilities.  PASRR submitted to EDS on       PASRR number received on       Existing PASRR number confirmed on 02/24/17     FL2 transmitted to all facilities in geographic area requested by pt/family on 02/24/17     FL2 transmitted to all facilities within larger geographic area on       Patient informed that his/her managed care company has contracts with or will negotiate with certain facilities, including the following:        Yes   Patient/family informed of bed offers received.  Patient chooses bed at [facility]     Physician recommends and patient chooses bed at      Patient to be transferred to [facility] on  .  Patient to be transferred to facility by car     Patient family notified on 02/25/17 of transfer.  Name of family member notified:  Niece     PHYSICIAN       Additional Comment: Pt / niece are in agreement with dc to  [facility] today. PT approved transport by car. DC Summary sent to SNF for review. Scripts included in [employer]. # for report provided to nsg.   _______________________________________________ Luretha Rued, [place]  [phone] 02/25/2017, 2:25 PM

## 2017-02-25 NOTE — Progress Notes (Signed)
Occupational Therapy Treatment Patient Details Name: Megan Olsen MRN: [id number] DOB: [birth year] Today's Date: 02/25/2017    History of present illness R THA- direct anterior   OT comments  Cues for safety with RW.  Used reacher today to simulate pants (pillow case)  Follow Up Recommendations  SNF    Equipment Recommendations  3 in 1 bedside commode    Recommendations for Other Services      Precautions / Restrictions Precautions Precautions: Fall Restrictions Weight Bearing Restrictions: No       Mobility Bed Mobility Overal bed mobility: Needs Assistance Bed Mobility: Sit to Supine     Supine to sit: Min guard Sit to supine: Min assist   General bed mobility comments: pt able to move both legs off bed; min guard for safety. HOB raised  Transfers Overall transfer level: Needs assistance Equipment used: Rolling walker (2 wheeled) Transfers: Sit to/from Stand Sit to Stand: Min guard         General transfer comment: cues for UE placement    Balance                                           ADL either performed or assessed with clinical judgement   ADL       Grooming: Oral care;Supervision/safety;Standing               Lower Body Dressing: Minimal assistance;Sit to/from stand;With adaptive equipment (simulated pants)   Toilet Transfer: Min guard;Ambulation;BSC;RW   Toileting- Clothing Manipulation and Hygiene: Supervision/safety;Sitting/lateral lean         General ADL Comments: walked over to sink to retrieve items with min guard A. Cues for walker safety as pt tends to turn without turing walker.  Used Reacher to simulate donning pants     Vision       Perception     Praxis      Cognition Arousal/Alertness: Awake/alert Behavior During Therapy: WFL for tasks assessed/performed Overall Cognitive Status: Within Functional Limits for tasks assessed                                           Exercises     Shoulder Instructions       General Comments      Pertinent Vitals/ Pain       Pain Assessment: 0-10 Pain Score: 2  Pain Location: right hip Pain Descriptors / Indicators: Sore Pain Intervention(s): Limited activity within patient's tolerance;Monitored during session;Premedicated before session;Repositioned;Ice applied  Home Living                                          Prior Functioning/Environment              Frequency           Progress Toward Goals  OT Goals(current goals can now be found in the care plan section)  Progress towards OT goals: Progressing toward goals  Acute Rehab OT Goals Patient Stated Goal: to walk without pain, go to rehab OT Goal Formulation: With patient Time For Goal Achievement: 03/03/17  Plan      Co-evaluation  AM-PAC PT "6 Clicks" Daily Activity     Outcome Measure   Help from another person eating meals?: None Help from another person taking care of personal grooming?: A Little Help from another person toileting, which includes using toliet, bedpan, or urinal?: A Little Help from another person bathing (including washing, rinsing, drying)?: A Little Help from another person to put on and taking off regular upper body clothing?: A Little Help from another person to put on and taking off regular lower body clothing?: A Lot 6 Click Score: 18    End of Session    OT Visit Diagnosis: Pain Pain - Right/Left: Right Pain - part of body: Hip   Activity Tolerance Patient tolerated treatment well   Patient Left in chair;with call bell/phone within reach;with chair alarm set   Nurse Communication          Time: 5397-6734 OT Time Calculation (min): 22 min  Charges: OT General Charges $OT Visit: 1 Procedure OT Treatments $Self Care/Home Management : 8-22 mins  Megan Olsen, OTR/L [phone] 02/25/2017   Megan Olsen 02/25/2017, 9:12 AM

## 2017-02-25 NOTE — Progress Notes (Signed)
CSW assisting with dc planning. [employer] medicare has authorized SNF placement at [facility] for today if pt is stable for dc. CSW will continue to follow to assist with dc planning to SNF.  Werner Lean LCSW [phone]

## 2017-02-25 NOTE — Progress Notes (Signed)
Report called to [facility] at [place]

## 2017-02-25 NOTE — Progress Notes (Signed)
     Subjective: 2 Days Post-Op Procedure(s) (LRB): RIGHT TOTAL HIP ARTHROPLASTY ANTERIOR APPROACH (Right)   Patient reports pain as mild, pain controlled. No events throughout the night.  Working well with PT. Ready to be discharged to skilled nursing facility.  Objective:   VITALS:   Vitals:   02/24/17 2155 02/25/17 0545  BP: 130/85 (!) 165/78  Pulse: (!) 49 64  Resp: 16 15  Temp: 98.4 F (36.9 C) 98.2 F (36.8 C)    Dorsiflexion/Plantar flexion intact Incision: dressing C/D/I No cellulitis present Compartment soft  LABS  Recent Labs  02/24/17 0650 02/25/17 0540  HGB 9.9* 10.0*  HCT 29.4* 30.0*  WBC 9.9 10.2  PLT 137* 143*     Recent Labs  02/24/17 0650 02/25/17 0540  NA 141 142  K 3.9 4.3  BUN 17 17  CREATININE 0.64 0.62  GLUCOSE 93 92     Assessment/Plan: 2 Days Post-Op Procedure(s) (LRB): RIGHT TOTAL HIP ARTHROPLASTY ANTERIOR APPROACH (Right)   Up with therapy Discharge to SNF  Follow up in 2 weeks at [facility]. Follow up with OLIN,Tigran Haynie D in 2 weeks.  Contact information:  [facility] [address] [place] Jennise Both   PAC  02/25/2017, 8:20 AM

## 2017-02-25 NOTE — Discharge Summary (Signed)
Physician Discharge Summary  Patient ID: Megan Olsen MRN: [id number] DOB/AGE: Nov 06, 1927 83 y.o.  Admit date: 02/23/2017 Discharge date:  02/25/2017  Procedures:  Procedure(s) (LRB): RIGHT TOTAL HIP ARTHROPLASTY ANTERIOR APPROACH (Right)  Attending Physician:  Dr. Paralee Cancel   Admission Diagnoses:   Right hip AVN, OA and pain  Discharge Diagnoses:  Principal Problem:   S/P right THA, AA  Past Medical History:  Diagnosis Date  . Atrial fibrillation ([place])    catheter ablation of SVT in 2002  . CVA (cerebrovascular accident) ([place]) 2003   left brain  . Depression   . GERD (gastroesophageal reflux disease)   . Iron deficiency anemia   . Osteoarthritis   . Osteoporosis   . Right BBB/left ant fasc block     HPI:    Megan Olsen, 83 y.o. female, has a history of pain and functional disability in the right hip(s) due to arthritis and AVN and patient has failed non-surgical conservative treatments for greater than 12 weeks to include NSAID's and/or analgesics, corticosteriod injections, use of assistive devices and activity modification.  Onset of symptoms was gradual starting October 2017 with rapidlly worsening course since that time.The patient noted no past surgery on the right hip(s).  Patient currently rates pain in the right hip at 8 out of 10 with activity. Patient has night pain, worsening of pain with activity and weight bearing, trendelenberg gait, pain that interfers with activities of daily living and pain with passive range of motion. Patient has evidence of periarticular osteophytes, joint space narrowing and AVN by imaging studies. This condition presents safety issues increasing the risk of falls. There is no current active infection.  Risks, benefits and expectations were discussed with the patient.  Risks including but not limited to the risk of anesthesia, blood clots, nerve damage, blood vessel damage, failure of the prosthesis, infection and up to and including  death.  Patient understand the risks, benefits and expectations and wishes to proceed with surgery.   PCP: Megan Lor, MD   Discharged Condition: good  Hospital Course:  Patient underwent the above stated procedure on 02/23/2017. Patient tolerated the procedure well and brought to the recovery room in good condition and subsequently to the floor.  POD #1 BP: 156/59 ; Pulse: 56 ; Temp: 97.8 F (36.6 C) ; Resp: 15 Patient reports pain as mild, pain controlled.  No events throughout the night. Plan for discharge possibly tomorrowdue to underlying medical co-morbidities, pain control and need for inpatient therapy to meet goal of being discharged safely. Dorsiflexion/plantar flexion intact, incision: dressing C/D/I, no cellulitis present and compartment soft.   LABS  Basename    HGB     9.9  HCT     29.4   POD #2  BP: 165/78 ; Pulse: 64 ; Temp: 98.2 F (36.8 C) ; Resp: 15 Patient reports pain as mild, pain controlled. No events throughout the night.  Working well with PT. Ready to be discharged to skilled nursing facility. Dorsiflexion/plantar flexion intact, incision: dressing C/D/I, no cellulitis present and compartment soft.   LABS  Basename    HGB     10.0  HCT     30.0    Discharge Exam: General appearance: alert, cooperative and no distress Extremities: Homans sign is negative, no sign of DVT, no edema, redness or tenderness in the calves or thighs and no ulcers, gangrene or trophic changes  Disposition:  Skilled nursing facility with follow up in 2 weeks  Contact information for follow-up providers    Paralee Cancel, MD. Schedule an appointment as soon as possible for a visit in 2 week(s).   Specialty:  Orthopedic Surgery Contact information: [address] [place] [address] [phone]            Contact information for after-discharge care    Destination    [facility] Follow up.   Specialty:  [place] information: [place] [phone]                  Discharge Instructions    Call MD / Call 911    Complete by:  As directed    If you experience chest pain or shortness of breath, CALL 911 and be transported to the hospital emergency room.  If you develope a fever above 101 F, pus (white drainage) or increased drainage or redness at the wound, or calf pain, call your surgeon's office.   Change dressing    Complete by:  As directed    Maintain surgical dressing until follow up in the clinic. If the edges start to pull up, may reinforce with tape. If the dressing is no longer working, may remove and cover with gauze and tape, but must keep the area dry and clean.  Call with any questions or concerns.   Constipation Prevention    Complete by:  As directed    Drink plenty of fluids.  Prune juice may be helpful.  You may use a stool softener, such as Colace (over the counter) 100 mg twice a day.  Use MiraLax (over the counter) for constipation as needed.   Diet - low sodium heart healthy    Complete by:  As directed    Discharge instructions    Complete by:  As directed    Maintain surgical dressing until follow up in the clinic. If the edges start to pull up, may reinforce with tape. If the dressing is no longer working, may remove and cover with gauze and tape, but must keep the area dry and clean.  Follow up in 2 weeks at [facility]. Call with any questions or concerns.   Increase activity slowly as tolerated    Complete by:  As directed    Weight bearing as tolerated with assist device (walker, cane, etc) as directed, use it as long as suggested by your surgeon or therapist, typically at least 4-6 weeks.   TED hose    Complete by:  As directed    Use stockings (TED hose) for 2 weeks on both leg(s).  You may remove them at night for sleeping.      Allergies as of 02/25/2017      Reactions    Propoxyphene N-acetaminophen Nausea Only   Celecoxib Itching, Rash   Fexofenadine Rash   Penicillins Hives, Rash   Has patient had a PCN reaction causing immediate rash, facial/tongue/throat swelling, SOB or lightheadedness with hypotension: Yes Has patient had a PCN reaction causing severe rash involving mucus membranes or skin necrosis: No Has patient had a PCN reaction that required hospitalization: No Has patient had a PCN reaction occurring within the last 10 years: No If all of the above answers are "NO", then may proceed with Cephalosporin use.      Medication List    STOP taking these medications   acetaminophen 650 MG CR tablet Commonly known as:  TYLENOL   HYDROcodone-acetaminophen 5-325 MG tablet  Commonly known as:  NORCO/VICODIN Replaced by:  HYDROcodone-acetaminophen 7.5-325 MG tablet   meloxicam 15 MG tablet Commonly known as:  MOBIC     TAKE these medications   aspirin 81 MG chewable tablet Chew 1 tablet (81 mg total) by mouth 2 (two) times daily. Take for 4 weeks.   bisoprolol-hydrochlorothiazide 5-6.25 MG tablet Commonly known as:  ZIAC TAKE 1 TABLET EVERY DAY   cholecalciferol 1000 units tablet Commonly known as:  VITAMIN D Take 1,000 Units by mouth daily with breakfast.   DEXILANT 60 MG capsule Generic drug:  dexlansoprazole TAKE 1 CAPSULE EVERY DAY   docusate sodium 100 MG capsule Commonly known as:  COLACE Take 1 capsule (100 mg total) by mouth 2 (two) times daily.   ferrous sulfate 325 (65 FE) MG tablet Commonly known as:  FERROUSUL Take 1 tablet (325 mg total) by mouth 3 (three) times daily with meals.   HYDROcodone-acetaminophen 7.5-325 MG tablet Commonly known as:  NORCO Take 1-2 tablets by mouth every 4 (four) hours as needed for moderate pain or severe pain. Replaces:  HYDROcodone-acetaminophen 5-325 MG tablet   ketotifen 0.025 % ophthalmic solution Commonly known as:  ZADITOR Place 1 drop into both eyes as needed (dry eyes).     LORazepam 1 MG tablet Commonly known as:  ATIVAN TAKE 1/2 TABLET IN THE MORNING AND 1 TABLET AT BEDTIME   methocarbamol 500 MG tablet Commonly known as:  ROBAXIN Take 1 tablet (500 mg total) by mouth every 6 (six) hours as needed for muscle spasms.   polyethylene glycol packet Commonly known as:  MIRALAX / GLYCOLAX Take 17 g by mouth 2 (two) times daily.   PRESERVISION AREDS 2 Caps Take 1 capsule by mouth 2 (two) times daily.        Signed: [place]. Destry Dauber   PA-C  02/25/2017, 8:24 AM

## 2017-02-25 NOTE — Progress Notes (Signed)
Physical Therapy Treatment Patient Details Name: Megan Olsen MRN: [id number] DOB: [birth year] Today's Date: 02/25/2017    History of Present Illness R THA- direct anterior    PT Comments    POD # 1 pm sessiojn Assisted with a limited amb distance due to weakness, fatigue.  Assisted back to bed and positioned to comfort.  Pt remains unsteady and requires Min Assist to prevent LOB.  Pt will need ST Rehab at SNF prior to safely returning home.   Follow Up Recommendations  SNF;Supervision/Assistance - 24 hour     Equipment Recommendations  None recommended by PT    Recommendations for Other Services       Precautions / Restrictions Precautions Precautions: Fall Restrictions Weight Bearing Restrictions: No    Mobility  Bed Mobility Overal bed mobility: Needs Assistance Bed Mobility: Sit to Supine       Sit to supine: Min assist   General bed mobility comments: assisted back to bed  Transfers Overall transfer level: Needs assistance Equipment used: Rolling walker (2 wheeled) Transfers: Sit to/from Stand Sit to Stand: Min assist         General transfer comment: 25% VC's on safety with turn completion   Ambulation/Gait Ambulation/Gait assistance: Min assist Ambulation Distance (Feet): 18 Feet Assistive device: Rolling walker (2 wheeled) Gait Pattern/deviations: Step-to pattern;Decreased step length - right;Decreased stance time - right;Antalgic Gait velocity: decreased   General Gait Details: very unsteady gait with limited activity tolerance.  Pt required increased time and Min Assist to prevent LOB.  HIGH FALL RISK.    Stairs            Wheelchair Mobility    Modified Rankin (Stroke Patients Only)       Balance                                            Cognition Arousal/Alertness: Awake/alert Behavior During Therapy: WFL for tasks assessed/performed Overall Cognitive Status: Within Functional Limits for tasks assessed                                        Exercises      General Comments        Pertinent Vitals/Pain Pain Assessment: 0-10 Pain Score: 4  Pain Location: right hip Pain Descriptors / Indicators: Operative site guarding;Tender Pain Intervention(s): Monitored during session;Repositioned;Ice applied    Home Living                      Prior Function            PT Goals (current goals can now be found in the care plan section) Progress towards PT goals: Progressing toward goals    Frequency    7X/week      PT Plan Current plan remains appropriate    Co-evaluation              AM-PAC PT "6 Clicks" Daily Activity  Outcome Measure  Difficulty turning over in bed (including adjusting bedclothes, sheets and blankets)?: Total Difficulty moving from lying on back to sitting on the side of the bed? : Total Difficulty sitting down on and standing up from a chair with arms (e.g., wheelchair, bedside commode, etc,.)?: Total Help needed moving to and from a bed to  chair (including a wheelchair)?: Total Help needed walking in hospital room?: Total Help needed climbing 3-5 steps with a railing? : Total 6 Click Score: 6    End of Session Equipment Utilized During Treatment: Gait belt Activity Tolerance: Patient limited by fatigue Patient left: in bed;with call bell/phone within reach;with bed alarm set   PT Visit Diagnosis: Difficulty in walking, not elsewhere classified (R26.2)     Time: 2751-7001 PT Time Calculation (min) (ACUTE ONLY): 18 min  Charges:                       G Codes:       Rica Koyanagi  PTA [facility]  Acute  Rehab Pager      [phone]

## 2017-02-25 NOTE — Progress Notes (Signed)
Nutrition Brief Note  Patient identified on the Malnutrition Screening Tool (MST) Report  Wt Readings from Last 15 Encounters:  02/23/17 106 lb (48.1 kg)  02/15/17 106 lb 14.4 oz (48.5 kg)  01/13/17 105 lb 6.4 oz (47.8 kg)  12/07/16 108 lb 6.4 oz (49.2 kg)  05/21/16 125 lb (56.7 kg)  08/28/15 133 lb (60.3 kg)  01/07/15 132 lb (59.9 kg)  09/07/14 130 lb (59 kg)  07/27/14 131 lb (59.4 kg)  07/06/14 138 lb (62.6 kg)  06/12/14 137 lb (62.1 kg)  08/11/13 128 lb (58.1 kg)  04/10/13 125 lb (56.7 kg)  02/14/13 126 lb (57.2 kg)  01/25/13 127 lb (57.6 kg)    Body mass index is 18.19 kg/m. Patient meets criteria for underweight based on current BMI. R hip incision from 8/7; R hip fx s/p arthroplasty. Discharge order and discharge summary for SNF in place from this AM.   Current diet order is Regular, patient is consuming approximately 75-100% of meals at this time. Labs and medications reviewed. No nutrition interventions warranted at this time. If pt unable to d/c and nutrition issues arise, please consult RD.     Jarome Matin, MS, RD, LDN, [facility] Inpatient Clinical Dietitian Pager # [phone] After hours/weekend pager # [phone]

## 2017-02-25 NOTE — Progress Notes (Signed)
Physical Therapy Treatment Patient Details Name: Megan Olsen MRN: [id number] DOB: Apr 11, 1928 Today's Date: 02/25/2017    History of Present Illness R THA- direct anterior    PT Comments    POD # 2 Assisted OOB to amb to bathroom then in hallway.  Pt progressing slowly and will need ST Rehab at SNF prior to safely D/C to home   Follow Up Recommendations  SNF;Supervision/Assistance - 24 hour     Equipment Recommendations  None recommended by PT    Recommendations for Other Services       Precautions / Restrictions Precautions Precautions: Fall Restrictions Weight Bearing Restrictions: No    Mobility  Bed Mobility Overal bed mobility: Needs Assistance Bed Mobility: Supine to Sit     Supine to sit: Min guard     General bed mobility comments: increased time and 25% VC's on proper hand placement to complete scooting to EOB  Transfers Overall transfer level: Needs assistance Equipment used: Rolling walker (2 wheeled) Transfers: Sit to/from Stand Sit to Stand: Min guard;Min assist         General transfer comment: 25% VC's on proper hand placement with stand to sit to control desend.  Also assisted on/off toilet with 25% VC's on safety with turns.    Ambulation/Gait Ambulation/Gait assistance: Min assist Ambulation Distance (Feet): 22 Feet Assistive device: Rolling walker (2 wheeled) Gait Pattern/deviations: Step-to pattern;Decreased step length - right;Decreased stance time - right;Antalgic Gait velocity: decreased   General Gait Details: tolerated an increased distance but still limited by fatigue.  Unsteady gait.  HIGH FALL RISK   Stairs            Wheelchair Mobility    Modified Rankin (Stroke Patients Only)       Balance                                            Cognition Arousal/Alertness: Awake/alert Behavior During Therapy: WFL for tasks assessed/performed Overall Cognitive Status: Within Functional Limits for  tasks assessed                                        Exercises      General Comments        Pertinent Vitals/Pain Pain Assessment: 0-10 Pain Score: 2  Pain Location: right hip Pain Descriptors / Indicators: Sore Pain Intervention(s): Monitored during session;Repositioned;Ice applied    Home Living                      Prior Function            PT Goals (current goals can now be found in the care plan section) Acute Rehab PT Goals Patient Stated Goal: to walk without pain, go to rehab Progress towards PT goals: Progressing toward goals    Frequency    7X/week      PT Plan Current plan remains appropriate    Co-evaluation              AM-PAC PT "6 Clicks" Daily Activity  Outcome Measure  Difficulty turning over in bed (including adjusting bedclothes, sheets and blankets)?: Total Difficulty moving from lying on back to sitting on the side of the bed? : Total Difficulty sitting down on and standing up from a chair  with arms (e.g., wheelchair, bedside commode, etc,.)?: Total Help needed moving to and from a bed to chair (including a wheelchair)?: A Lot Help needed walking in hospital room?: A Lot Help needed climbing 3-5 steps with a railing? : A Lot 6 Click Score: 9    End of Session Equipment Utilized During Treatment: Gait belt Activity Tolerance: Patient limited by fatigue Patient left: in chair;with call bell/phone within reach;with chair alarm set   PT Visit Diagnosis: Unsteadiness on feet (R26.81)     Time: 1010-1035 PT Time Calculation (min) (ACUTE ONLY): 25 min  Charges:  $Gait Training: 8-22 mins $Therapeutic Activity: 8-22 mins                    G Codes:       {Drusilla Wampole  PTA [facility]  Acute  Rehab Pager      [phone]

## 2017-02-26 DIAGNOSIS — Z8673 Personal history of transient ischemic attack (TIA), and cerebral infarction without residual deficits: Secondary | ICD-10-CM | POA: Diagnosis not present

## 2017-02-26 DIAGNOSIS — I480 Paroxysmal atrial fibrillation: Secondary | ICD-10-CM | POA: Diagnosis not present

## 2017-02-26 DIAGNOSIS — I10 Essential (primary) hypertension: Secondary | ICD-10-CM | POA: Diagnosis not present

## 2017-02-26 DIAGNOSIS — M1611 Unilateral primary osteoarthritis, right hip: Secondary | ICD-10-CM | POA: Diagnosis not present

## 2017-03-03 ENCOUNTER — Other Ambulatory Visit: Payer: Self-pay | Admitting: *Deleted

## 2017-03-03 DIAGNOSIS — I10 Essential (primary) hypertension: Secondary | ICD-10-CM | POA: Diagnosis not present

## 2017-03-03 DIAGNOSIS — Z96641 Presence of right artificial hip joint: Secondary | ICD-10-CM | POA: Diagnosis not present

## 2017-03-03 DIAGNOSIS — D509 Iron deficiency anemia, unspecified: Secondary | ICD-10-CM | POA: Diagnosis not present

## 2017-03-03 DIAGNOSIS — I4891 Unspecified atrial fibrillation: Secondary | ICD-10-CM | POA: Diagnosis not present

## 2017-03-03 NOTE — Patient Outreach (Signed)
[facility] Care Management  03/03/2017  Megan Olsen 01-20-1928 [id number]  SW at facility reports patient to discharge 8/16, home with [facility].  Met with patient at bedside. Patient confirms that she has great support from family and friends.  Patient states she has transportation from friends, she has to go every five weeks to get eye injections due to macular degeneration.  Patient reports no issues with medication management or affordability.   RNCM discussed [facility] care management. Patient accepted information and states she would accept phone calls if indicated. But did not sign consent form.  Plan to sign off. Patient has information. Royetta Crochet. Laymond Purser, RN, BSN, [place] [phone]) Business Cell  [phone]) Toll Free Office

## 2017-03-06 DIAGNOSIS — Z96641 Presence of right artificial hip joint: Secondary | ICD-10-CM | POA: Diagnosis not present

## 2017-03-06 DIAGNOSIS — M1991 Primary osteoarthritis, unspecified site: Secondary | ICD-10-CM | POA: Diagnosis not present

## 2017-03-06 DIAGNOSIS — Z471 Aftercare following joint replacement surgery: Secondary | ICD-10-CM | POA: Diagnosis not present

## 2017-03-06 DIAGNOSIS — R41841 Cognitive communication deficit: Secondary | ICD-10-CM | POA: Diagnosis not present

## 2017-03-06 DIAGNOSIS — I10 Essential (primary) hypertension: Secondary | ICD-10-CM | POA: Diagnosis not present

## 2017-03-06 DIAGNOSIS — K219 Gastro-esophageal reflux disease without esophagitis: Secondary | ICD-10-CM | POA: Diagnosis not present

## 2017-03-08 ENCOUNTER — Telehealth: Payer: Self-pay | Admitting: Internal Medicine

## 2017-03-08 DIAGNOSIS — Z471 Aftercare following joint replacement surgery: Secondary | ICD-10-CM | POA: Diagnosis not present

## 2017-03-08 DIAGNOSIS — R41841 Cognitive communication deficit: Secondary | ICD-10-CM | POA: Diagnosis not present

## 2017-03-08 DIAGNOSIS — M1991 Primary osteoarthritis, unspecified site: Secondary | ICD-10-CM | POA: Diagnosis not present

## 2017-03-08 DIAGNOSIS — K219 Gastro-esophageal reflux disease without esophagitis: Secondary | ICD-10-CM | POA: Diagnosis not present

## 2017-03-08 DIAGNOSIS — Z96641 Presence of right artificial hip joint: Secondary | ICD-10-CM | POA: Diagnosis not present

## 2017-03-08 DIAGNOSIS — I10 Essential (primary) hypertension: Secondary | ICD-10-CM | POA: Diagnosis not present

## 2017-03-08 NOTE — Telephone Encounter (Signed)
Please advice  

## 2017-03-08 NOTE — Telephone Encounter (Addendum)
Pt had surgery on 8/7 and did not call and request a refill of the HYDROcodone-acetaminophen ([place]) 7.5-325 MG tablet  Pt was in surgery this day and no record of pt calling to request this rx in the past month.. There is no rx up front for pt to pick up.  Pt does not have this refill, and needs a new rx.

## 2017-03-08 NOTE — Telephone Encounter (Signed)
Spoke pt and made her aware that Rx is ready for pick up at the front desk since 02/23/17

## 2017-03-08 NOTE — Telephone Encounter (Signed)
Pt request refill  °HYDROcodone-acetaminophen (NORCO) 7.5-325 MG tablet °

## 2017-03-08 NOTE — Telephone Encounter (Signed)
Okay for refill?  

## 2017-03-09 MED ORDER — HYDROCODONE-ACETAMINOPHEN 7.5-325 MG PO TABS: 1.0000 | ORAL_TABLET | ORAL | 0 refills | Status: DC | PRN

## 2017-03-09 NOTE — Telephone Encounter (Signed)
Rx printed awaiting to be signed.  

## 2017-03-09 NOTE — Telephone Encounter (Signed)
Pt notified Rx ready for pickup. Rx printed and signed.  

## 2017-03-10 DIAGNOSIS — I10 Essential (primary) hypertension: Secondary | ICD-10-CM | POA: Diagnosis not present

## 2017-03-10 DIAGNOSIS — Z471 Aftercare following joint replacement surgery: Secondary | ICD-10-CM | POA: Diagnosis not present

## 2017-03-10 DIAGNOSIS — K219 Gastro-esophageal reflux disease without esophagitis: Secondary | ICD-10-CM | POA: Diagnosis not present

## 2017-03-10 DIAGNOSIS — R41841 Cognitive communication deficit: Secondary | ICD-10-CM | POA: Diagnosis not present

## 2017-03-10 DIAGNOSIS — M1991 Primary osteoarthritis, unspecified site: Secondary | ICD-10-CM | POA: Diagnosis not present

## 2017-03-10 DIAGNOSIS — Z96641 Presence of right artificial hip joint: Secondary | ICD-10-CM | POA: Diagnosis not present

## 2017-03-17 DIAGNOSIS — H353221 Exudative age-related macular degeneration, left eye, with active choroidal neovascularization: Secondary | ICD-10-CM | POA: Diagnosis not present

## 2017-03-17 DIAGNOSIS — H353211 Exudative age-related macular degeneration, right eye, with active choroidal neovascularization: Secondary | ICD-10-CM | POA: Diagnosis not present

## 2017-03-17 DIAGNOSIS — H353231 Exudative age-related macular degeneration, bilateral, with active choroidal neovascularization: Secondary | ICD-10-CM | POA: Diagnosis not present

## 2017-03-30 ENCOUNTER — Ambulatory Visit: Payer: Medicare HMO

## 2017-04-07 DIAGNOSIS — Z471 Aftercare following joint replacement surgery: Secondary | ICD-10-CM | POA: Diagnosis not present

## 2017-04-07 DIAGNOSIS — Z96641 Presence of right artificial hip joint: Secondary | ICD-10-CM | POA: Diagnosis not present

## 2017-04-14 ENCOUNTER — Encounter: Payer: Self-pay | Admitting: Internal Medicine

## 2017-04-14 ENCOUNTER — Telehealth: Payer: Self-pay | Admitting: Internal Medicine

## 2017-04-14 ENCOUNTER — Ambulatory Visit (INDEPENDENT_AMBULATORY_CARE_PROVIDER_SITE_OTHER): Payer: Medicare HMO | Admitting: Internal Medicine

## 2017-04-14 VITALS — BP 142/64 | HR 50 | Temp 97.9°F | Ht 64.0 in | Wt 107.4 lb

## 2017-04-14 DIAGNOSIS — D500 Iron deficiency anemia secondary to blood loss (chronic): Secondary | ICD-10-CM | POA: Diagnosis not present

## 2017-04-14 DIAGNOSIS — M1611 Unilateral primary osteoarthritis, right hip: Secondary | ICD-10-CM | POA: Diagnosis not present

## 2017-04-14 DIAGNOSIS — Z23 Encounter for immunization: Secondary | ICD-10-CM

## 2017-04-14 DIAGNOSIS — I10 Essential (primary) hypertension: Secondary | ICD-10-CM

## 2017-04-14 DIAGNOSIS — Z96641 Presence of right artificial hip joint: Secondary | ICD-10-CM | POA: Diagnosis not present

## 2017-04-14 MED ORDER — ESTRADIOL 1 MG PO TABS: 1.0000 mg | ORAL_TABLET | Freq: Every day | ORAL | 3 refills | Status: DC

## 2017-04-14 MED ORDER — BISOPROLOL-HYDROCHLOROTHIAZIDE 5-6.25 MG PO TABS: 1.0000 | ORAL_TABLET | Freq: Every day | ORAL | 1 refills | Status: DC

## 2017-04-14 MED ORDER — DEXLANSOPRAZOLE 60 MG PO CPDR: 1.0000 | DELAYED_RELEASE_CAPSULE | Freq: Every day | ORAL | 1 refills | Status: DC

## 2017-04-14 MED ORDER — LORAZEPAM 1 MG PO TABS: ORAL_TABLET | ORAL | 5 refills | Status: DC

## 2017-04-14 NOTE — Patient Instructions (Signed)
Limit your sodium (Salt) intake  Please check your blood pressure on a regular basis.  If it is consistently greater than 150/90, please make an office appointment.    It is important that you exercise regularly, at least 20 minutes 3 to 4 times per week.  If you develop chest pain or shortness of breath seek  medical attention.  Take a calcium supplement, plus [phone] units of vitamin D

## 2017-04-14 NOTE — Telephone Encounter (Signed)
Pt calling stating that her medication that was called in to [facility] should be going to [facility] order and would like to see if this can be done.

## 2017-04-14 NOTE — Progress Notes (Signed)
Subjective:    Patient ID: Megan Olsen, female    DOB: 1928-05-13, 83 y.o.   MRN: [id number]  HPI  83 year old patient who has a history of osteoarthritis.  She is now status post right THA and has done remarkably well.  She has has a history of hypothyroidism and osteoporosis.  She has essential hypertension. She has done remarkably well.  Postoperative, she took the iron.  Short-term  due to some postop mild anemia.  She feels quite well today and is accompanied by her daughter.  Past Medical History:  Diagnosis Date  . Atrial fibrillation ([place])    catheter ablation of SVT in 2002  . CVA (cerebrovascular accident) ([place]) 2003   left brain  . Depression   . GERD (gastroesophageal reflux disease)   . Iron deficiency anemia   . Osteoarthritis   . Osteoporosis   . Right BBB/left ant fasc block      Social History   Social History  . Marital status: Single    Spouse name: N/A  . Number of children: N/A  . Years of education: N/A   Occupational History  . Not on file.   Social History Main Topics  . Smoking status: Never Smoker  . Smokeless tobacco: Never Used  . Alcohol use Yes     Comment: social ; seldom   . Drug use: No  . Sexual activity: No   Other Topics Concern  . Not on file   Social History Narrative  . No narrative on file    Past Surgical History:  Procedure Laterality Date  . ABDOMINAL HYSTERECTOMY    . CHOLECYSTECTOMY    . COLONOSCOPY    . HIP PINNING,CANNULATED Left 07/07/2014   Procedure: CANNULATED HIP PINNING;  Surgeon: Mauri Pole, MD;  Location: [facility];  Service: Orthopedics;  Laterality: Left;  . LAPAROSCOPIC OVARIAN CYSTECTOMY    . right eye surgery  2008   dr Zadie Rhine.  Vitrectomy and removal of tissue.   Marland Kitchen TOTAL ABDOMINAL HYSTERECTOMY    . TOTAL HIP ARTHROPLASTY Right 02/23/2017   Procedure: RIGHT TOTAL HIP ARTHROPLASTY ANTERIOR APPROACH;  Surgeon: Paralee Cancel, MD;  Location: [facility];  Service: Orthopedics;  Laterality: Right;     No family history on file.  Allergies  Allergen Reactions  . Propoxyphene N-Acetaminophen Nausea Only  . Celecoxib Itching and Rash  . Fexofenadine Rash  . Penicillins Hives and Rash    Has patient had a PCN reaction causing immediate rash, facial/tongue/throat swelling, SOB or lightheadedness with hypotension: Yes Has patient had a PCN reaction causing severe rash involving mucus membranes or skin necrosis: No Has patient had a PCN reaction that required hospitalization: No Has patient had a PCN reaction occurring within the last 10 years: No If all of the above answers are "NO", then may proceed with Cephalosporin use.     Current Outpatient Prescriptions on File Prior to Visit  Medication Sig Dispense Refill  . aspirin 81 MG chewable tablet Chew 1 tablet (81 mg total) by mouth 2 (two) times daily. Take for 4 weeks. 60 tablet 0  . cholecalciferol (VITAMIN D) 1000 UNITS tablet Take 1,000 Units by mouth daily with breakfast.    . docusate sodium (COLACE) 100 MG capsule Take 1 capsule (100 mg total) by mouth 2 (two) times daily. 10 capsule 0  . ferrous sulfate (FERROUSUL) 325 (65 FE) MG tablet Take 1 tablet (325 mg total) by mouth 3 (three) times daily with meals.    Marland Kitchen  HYDROcodone-acetaminophen (NORCO) 7.5-325 MG tablet Take 1-2 tablets by mouth every 4 (four) hours as needed for moderate pain or severe pain. 90 tablet 0  . ketotifen (ZADITOR) 0.025 % ophthalmic solution Place 1 drop into both eyes as needed (dry eyes).    . Multiple Vitamins-Minerals (PRESERVISION AREDS 2) CAPS Take 1 capsule by mouth 2 (two) times daily.     . polyethylene glycol (MIRALAX / GLYCOLAX) packet Take 17 g by mouth 2 (two) times daily. 14 each 0   No current facility-administered medications on file prior to visit.     BP (!) 142/64 (BP Location: Left Arm, Patient Position: Sitting, Cuff Size: Normal)   Pulse (!) 50   Temp 97.9 F (36.6 C) (Oral)   Ht 5\' 4"  (1.626 m)   Wt 107 lb 6.4 oz (48.7 kg)    SpO2 96%   BMI 18.44 kg/m     Review of Systems  Constitutional: Negative.   HENT: Negative for congestion, dental problem, hearing loss, rhinorrhea, sinus pressure, sore throat and tinnitus.   Eyes: Negative for pain, discharge and visual disturbance.  Respiratory: Negative for cough and shortness of breath.   Cardiovascular: Negative for chest pain, palpitations and leg swelling.  Gastrointestinal: Negative for abdominal distention, abdominal pain, blood in stool, constipation, diarrhea, nausea and vomiting.  Genitourinary: Negative for difficulty urinating, dysuria, flank pain, frequency, hematuria, pelvic pain, urgency, vaginal bleeding, vaginal discharge and vaginal pain.  Musculoskeletal: Positive for gait problem and myalgias. Negative for arthralgias and joint swelling.  Skin: Negative for rash.  Neurological: Negative for dizziness, syncope, speech difficulty, weakness, numbness and headaches.  Hematological: Negative for adenopathy.  Psychiatric/Behavioral: Negative for agitation, behavioral problems and dysphoric mood. The patient is not nervous/anxious.        Objective:   Physical Exam  Constitutional: She is oriented to person, place, and time. She appears well-developed and well-nourished.  HENT:  Head: Normocephalic.  Right Ear: External ear normal.  Left Ear: External ear normal.  Mouth/Throat: Oropharynx is clear and moist.  Eyes: Pupils are equal, round, and reactive to light. Conjunctivae and EOM are normal.  Neck: Normal range of motion. Neck supple. No thyromegaly present.  Cardiovascular: Normal rate, regular rhythm, normal heart sounds and intact distal pulses.   Pulmonary/Chest: Effort normal and breath sounds normal.  Abdominal: Soft. Bowel sounds are normal. She exhibits no mass. There is no tenderness.  Musculoskeletal: Normal range of motion.  Lymphadenopathy:    She has no cervical adenopathy.  Neurological: She is alert and oriented to person,  place, and time.  Skin: Skin is warm and dry. No rash noted.  Psychiatric: She has a normal mood and affect. Her behavior is normal.          Assessment & Plan:   Essential hypertension, well-controlled Status post right hip arthroplasty Osteoporosis.  Continue calcium and vitamin D supplements.  She gave herself a trial off Estrace which was not well tolerated.  She has resumed Osteoarthritis.  Follow-up 6 months  Nyoka Cowden

## 2017-04-15 MED ORDER — KETOTIFEN FUMARATE 0.025 % OP SOLN: 1.0000 [drp] | OPHTHALMIC | 3 refills | Status: DC | PRN

## 2017-04-15 MED ORDER — DEXLANSOPRAZOLE 60 MG PO CPDR: 1.0000 | DELAYED_RELEASE_CAPSULE | Freq: Every day | ORAL | 3 refills | Status: DC

## 2017-04-15 MED ORDER — DOCUSATE SODIUM 100 MG PO CAPS: 100.0000 mg | ORAL_CAPSULE | Freq: Two times a day (BID) | ORAL | 0 refills | Status: DC

## 2017-04-15 MED ORDER — POLYETHYLENE GLYCOL 3350 17 G PO PACK: 17.0000 g | PACK | Freq: Two times a day (BID) | ORAL | 6 refills | Status: DC

## 2017-04-15 MED ORDER — VITAMIN D 1000 UNITS PO TABS: 1000.0000 [IU] | ORAL_TABLET | Freq: Every day | ORAL | 3 refills | Status: DC

## 2017-04-15 MED ORDER — BISOPROLOL-HYDROCHLOROTHIAZIDE 5-6.25 MG PO TABS: 1.0000 | ORAL_TABLET | Freq: Every day | ORAL | 3 refills | Status: DC

## 2017-04-15 MED ORDER — FERROUS SULFATE 325 (65 FE) MG PO TABS: 325.0000 mg | ORAL_TABLET | Freq: Three times a day (TID) | ORAL | 1 refills | Status: DC

## 2017-04-15 MED ORDER — ESTRADIOL 1 MG PO TABS: 1.0000 mg | ORAL_TABLET | Freq: Every day | ORAL | 3 refills | Status: DC

## 2017-04-15 MED ORDER — PRESERVISION AREDS 2 PO CAPS: 1.0000 | ORAL_CAPSULE | Freq: Two times a day (BID) | ORAL | 3 refills | Status: DC

## 2017-04-15 NOTE — Telephone Encounter (Signed)
Pt called back to check to see who called in.

## 2017-04-15 NOTE — Telephone Encounter (Signed)
Called [facility] to cancel the refill that were e-scribed yesterday and they were sent to  [facility] order.

## 2017-04-16 ENCOUNTER — Ambulatory Visit: Payer: Medicare HMO | Admitting: Internal Medicine

## 2017-04-28 DIAGNOSIS — H353211 Exudative age-related macular degeneration, right eye, with active choroidal neovascularization: Secondary | ICD-10-CM | POA: Diagnosis not present

## 2017-04-28 DIAGNOSIS — H353221 Exudative age-related macular degeneration, left eye, with active choroidal neovascularization: Secondary | ICD-10-CM | POA: Diagnosis not present

## 2017-04-28 DIAGNOSIS — H353231 Exudative age-related macular degeneration, bilateral, with active choroidal neovascularization: Secondary | ICD-10-CM | POA: Diagnosis not present

## 2017-04-30 ENCOUNTER — Telehealth: Payer: Self-pay | Admitting: Internal Medicine

## 2017-04-30 NOTE — Telephone Encounter (Signed)
Called pt because she has an AWV scheduled for 10/17 and Dr Raliegh Ip will not be here in the afternoon.  We need to have this rescheduled for a day that he is here all day.  He cannot sign off on the visit if he isn't here.  lmom 10/12

## 2017-05-05 ENCOUNTER — Ambulatory Visit: Payer: Medicare HMO

## 2017-05-06 ENCOUNTER — Ambulatory Visit (INDEPENDENT_AMBULATORY_CARE_PROVIDER_SITE_OTHER): Payer: Medicare HMO

## 2017-05-06 VITALS — BP 138/68 | HR 56 | Ht 64.0 in | Wt 112.1 lb

## 2017-05-06 DIAGNOSIS — Z0000 Encounter for general adult medical examination without abnormal findings: Secondary | ICD-10-CM | POA: Diagnosis not present

## 2017-05-06 NOTE — Patient Instructions (Addendum)
Ms. Megan Olsen , Thank you for taking time to come for your Medicare Wellness Visit. I appreciate your ongoing commitment to your health goals. Please review the following plan we discussed and let me know if I can assist you in the future.   Try to Y and just walk around It would be nice to walk on a track or use some of the machines  [email or web address]  [employer]; 828-161-5969 Senior Directory; Information regarding Long Term Care   [place] [facility]; [phone] ( may have a Social Worker to come out and talk to you about options in senior living)  [facility]; [phone] Get resource to get information on any and all community programs for [employer]: 657-447-0006 [facility] Response Program -506-117-2052 Public Health Dept; Need to be a skilled visit but can assist with bathing as well; [phone]  Adult center for Enrichment;  Call Senior Line; [phone]  Adult day services include Adult Day Care, Adult Day Healthcare, Group Respite, Care Partners, Volunteer In [employer], Education and Support Program  Dept of Social Services; Call [phone] and ask for SW on call  Options for Medicaid include the Community Alternatives program; [facility] (personal care services) or PACE program, which is a medical and social program combined  Silvestre Gunner manages the community Alternatives program at the [employer]; 8478402643 (this is a program with a waiting list but provides SNF care at home;  [facility] 2 required; Call Tresa Endo and she will send out packet of information   Caregiver support group and information regarding Long Term Care is at the; [facility] Address: [address] [place], [place] [address]  Phone: [phone]   [email or web address] general resources for food etc    Deaf & Hard of Hearing Division Services - can assist with hearing aid x 1  No reviews  [employer]  Office  [address]  [phone]    These are the goals we discussed: Goals    . Exercise 150 minutes per week (moderate activity)          Can start going to the [facility] which is near you Will make a plan to get our more when her hip is better        This is a list of the screening recommended for you and due dates:  Health Maintenance  Topic Date Due  . Tetanus Vaccine  05/02/2022  . Flu Shot  Completed  . DEXA scan (bone density measurement)  Completed  . Pneumonia vaccines  Completed        Fall Prevention in the Home Falls can cause injuries. They can happen to people of all ages. There are many things you can do to make your home safe and to help prevent falls. What can I do on the outside of my home?  Regularly fix the edges of walkways and driveways and fix any cracks.  Remove anything that might make you trip as you walk through a door, such as a raised step or threshold.  Trim any bushes or trees on the path to your home.  Use bright outdoor lighting.  Clear any walking paths of anything that might make someone trip, such as rocks or tools.  Regularly check to see if handrails are loose or broken. Make sure that both sides of any steps have handrails.  Any raised decks and porches should have guardrails on the edges.  Have any leaves, snow, or ice  cleared regularly.  Use sand or salt on walking paths during winter.  Clean up any spills in your garage right away. This includes oil or grease spills. What can I do in the bathroom?  Use night lights.  Install grab bars by the toilet and in the tub and shower. Do not use towel bars as grab bars.  Use non-skid mats or decals in the tub or shower.  If you need to sit down in the shower, use a plastic, non-slip stool.  Keep the floor dry. Clean up any water that spills on the floor as soon as it happens.  Remove soap buildup in the tub or shower regularly.  Attach bath mats securely  with double-sided non-slip rug tape.  Do not have throw rugs and other things on the floor that can make you trip. What can I do in the bedroom?  Use night lights.  Make sure that you have a light by your bed that is easy to reach.  Do not use any sheets or blankets that are too big for your bed. They should not hang down onto the floor.  Have a firm chair that has side arms. You can use this for support while you get dressed.  Do not have throw rugs and other things on the floor that can make you trip. What can I do in the kitchen?  Clean up any spills right away.  Avoid walking on wet floors.  Keep items that you use a lot in easy-to-reach places.  If you need to reach something above you, use a strong step stool that has a grab bar.  Keep electrical cords out of the way.  Do not use floor polish or wax that makes floors slippery. If you must use wax, use non-skid floor wax.  Do not have throw rugs and other things on the floor that can make you trip. What can I do with my stairs?  Do not leave any items on the stairs.  Make sure that there are handrails on both sides of the stairs and use them. Fix handrails that are broken or loose. Make sure that handrails are as long as the stairways.  Check any carpeting to make sure that it is firmly attached to the stairs. Fix any carpet that is loose or worn.  Avoid having throw rugs at the top or bottom of the stairs. If you do have throw rugs, attach them to the floor with carpet tape.  Make sure that you have a light switch at the top of the stairs and the bottom of the stairs. If you do not have them, ask someone to add them for you. What else can I do to help prevent falls?  Wear shoes that: ? Do not have high heels. ? Have rubber bottoms. ? Are comfortable and fit you well. ? Are closed at the toe. Do not wear sandals.  If you use a stepladder: ? Make sure that it is fully opened. Do not climb a closed  stepladder. ? Make sure that both sides of the stepladder are locked into place. ? Ask someone to hold it for you, if possible.  Clearly mark and make sure that you can see: ? Any grab bars or handrails. ? First and last steps. ? Where the edge of each step is.  Use tools that help you move around (mobility aids) if they are needed. These include: ? Canes. ? Walkers. ? Scooters. ? Crutches.  Turn on the lights  when you go into a dark area. Replace any light bulbs as soon as they burn out.  Set up your furniture so you have a clear path. Avoid moving your furniture around.  If any of your floors are uneven, fix them.  If there are any pets around you, be aware of where they are.  Review your medicines with your doctor. Some medicines can make you feel dizzy. This can increase your chance of falling. Ask your doctor what other things that you can do to help prevent falls. This information is not intended to replace advice given to you by your health care provider. Make sure you discuss any questions you have with your health care provider. Document Released: 05/02/2009 Document Revised: 12/12/2015 Document Reviewed: 08/10/2014 Elsevier Interactive Patient Education  2018 [place] Maintenance, Female Adopting a healthy lifestyle and getting preventive care can go a long way to promote health and wellness. Talk with your health care provider about what schedule of regular examinations is right for you. This is a good chance for you to check in with your provider about disease prevention and staying healthy. In between checkups, there are plenty of things you can do on your own. Experts have done a lot of research about which lifestyle changes and preventive measures are most likely to keep you healthy. Ask your health care provider for more information. Weight and diet Eat a healthy diet  Be sure to include plenty of vegetables, fruits, low-fat dairy products, and lean  protein.  Do not eat a lot of foods high in solid fats, added sugars, or salt.  Get regular exercise. This is one of the most important things you can do for your health. ? Most adults should exercise for at least 150 minutes each week. The exercise should increase your heart rate and make you sweat (moderate-intensity exercise). ? Most adults should also do strengthening exercises at least twice a week. This is in addition to the moderate-intensity exercise.  Maintain a healthy weight  Body mass index (BMI) is a measurement that can be used to identify possible weight problems. It estimates body fat based on height and weight. Your health care provider can help determine your BMI and help you achieve or maintain a healthy weight.  For females 1 years of age and older: ? A BMI below 18.5 is considered underweight. ? A BMI of 18.5 to 24.9 is normal. ? A BMI of 25 to 29.9 is considered overweight. ? A BMI of 30 and above is considered obese.  Watch levels of cholesterol and blood lipids  You should start having your blood tested for lipids and cholesterol at 81 years of age, then have this test every 5 years.  You may need to have your cholesterol levels checked more often if: ? Your lipid or cholesterol levels are high. ? You are older than 81 years of age. ? You are at high risk for heart disease.  Cancer screening Lung Cancer  Lung cancer screening is recommended for adults 90-28 years old who are at high risk for lung cancer because of a history of smoking.  A yearly low-dose CT scan of the lungs is recommended for people who: ? Currently smoke. ? Have quit within the past 15 years. ? Have at least a 30-pack-year history of smoking. A pack year is smoking an average of one pack of cigarettes a day for 1 year.  Yearly screening should continue until it has been 15 years since  you quit.  Yearly screening should stop if you develop a health problem that would prevent you from  having lung cancer treatment.  Breast Cancer  Practice breast self-awareness. This means understanding how your breasts normally appear and feel.  It also means doing regular breast self-exams. Let your health care provider know about any changes, no matter how small.  If you are in your 20s or 30s, you should have a clinical breast exam (CBE) by a health care provider every 1-3 years as part of a regular health exam.  If you are 17 or older, have a CBE every year. Also consider having a breast X-ray (mammogram) every year.  If you have a family history of breast cancer, talk to your health care provider about genetic screening.  If you are at high risk for breast cancer, talk to your health care provider about having an MRI and a mammogram every year.  Breast cancer gene (BRCA) assessment is recommended for women who have family members with BRCA-related cancers. BRCA-related cancers include: ? Breast. ? Ovarian. ? Tubal. ? Peritoneal cancers.  Results of the assessment will determine the need for genetic counseling and BRCA1 and BRCA2 testing.  Cervical Cancer Your health care provider may recommend that you be screened regularly for cancer of the pelvic organs (ovaries, uterus, and vagina). This screening involves a pelvic examination, including checking for microscopic changes to the surface of your cervix (Pap test). You may be encouraged to have this screening done every 3 years, beginning at age 82.  For women ages 34-65, health care providers may recommend pelvic exams and Pap testing every 3 years, or they may recommend the Pap and pelvic exam, combined with testing for human papilloma virus (HPV), every 5 years. Some types of HPV increase your risk of cervical cancer. Testing for HPV may also be done on women of any age with unclear Pap test results.  Other health care providers may not recommend any screening for nonpregnant women who are considered low risk for pelvic cancer  and who do not have symptoms. Ask your health care provider if a screening pelvic exam is right for you.  If you have had past treatment for cervical cancer or a condition that could lead to cancer, you need Pap tests and screening for cancer for at least 20 years after your treatment. If Pap tests have been discontinued, your risk factors (such as having a new sexual partner) need to be reassessed to determine if screening should resume. Some women have medical problems that increase the chance of getting cervical cancer. In these cases, your health care provider may recommend more frequent screening and Pap tests.  Colorectal Cancer  This type of cancer can be detected and often prevented.  Routine colorectal cancer screening usually begins at 81 years of age and continues through 81 years of age.  Your health care provider may recommend screening at an earlier age if you have risk factors for colon cancer.  Your health care provider may also recommend using home test kits to check for hidden blood in the stool.  A small camera at the end of a tube can be used to examine your colon directly (sigmoidoscopy or colonoscopy). This is done to check for the earliest forms of colorectal cancer.  Routine screening usually begins at age 2.  Direct examination of the colon should be repeated every 5-10 years through 81 years of age. However, you may need to be screened more often if early  forms of precancerous polyps or small growths are found.  Skin Cancer  Check your skin from head to toe regularly.  Tell your health care provider about any new moles or changes in moles, especially if there is a change in a mole's shape or color.  Also tell your health care provider if you have a mole that is larger than the size of a pencil eraser.  Always use sunscreen. Apply sunscreen liberally and repeatedly throughout the day.  Protect yourself by wearing long sleeves, pants, a wide-brimmed hat, and  sunglasses whenever you are outside.  Heart disease, diabetes, and high blood pressure  High blood pressure causes heart disease and increases the risk of stroke. High blood pressure is more likely to develop in: ? People who have blood pressure in the high end of the normal range (130-139/85-89 mm Hg). ? People who are overweight or obese. ? People who are African American.  If you are 35-66 years of age, have your blood pressure checked every 3-5 years. If you are 91 years of age or older, have your blood pressure checked every year. You should have your blood pressure measured twice-once when you are at a hospital or clinic, and once when you are not at a hospital or clinic. Record the average of the two measurements. To check your blood pressure when you are not at a hospital or clinic, you can use: ? An automated blood pressure machine at a pharmacy. ? A home blood pressure monitor.  If you are between 93 years and 40 years old, ask your health care provider if you should take aspirin to prevent strokes.  Have regular diabetes screenings. This involves taking a blood sample to check your fasting blood sugar level. ? If you are at a normal weight and have a low risk for diabetes, have this test once every three years after 81 years of age. ? If you are overweight and have a high risk for diabetes, consider being tested at a younger age or more often. Preventing infection Hepatitis B  If you have a higher risk for hepatitis B, you should be screened for this virus. You are considered at high risk for hepatitis B if: ? You were born in a country where hepatitis B is common. Ask your health care provider which countries are considered high risk. ? Your parents were born in a high-risk country, and you have not been immunized against hepatitis B (hepatitis B vaccine). ? You have HIV or AIDS. ? You use needles to inject street drugs. ? You live with someone who has hepatitis B. ? You have  had sex with someone who has hepatitis B. ? You get hemodialysis treatment. ? You take certain medicines for conditions, including cancer, organ transplantation, and autoimmune conditions.  Hepatitis C  Blood testing is recommended for: ? Everyone born from [birth year] through 1963. ? Anyone with known risk factors for hepatitis C.  Sexually transmitted infections (STIs)  You should be screened for sexually transmitted infections (STIs) including gonorrhea and chlamydia if: ? You are sexually active and are younger than 81 years of age. ? You are older than 81 years of age and your health care provider tells you that you are at risk for this type of infection. ? Your sexual activity has changed since you were last screened and you are at an increased risk for chlamydia or gonorrhea. Ask your health care provider if you are at risk.  If you do not have HIV,  but are at risk, it may be recommended that you take a prescription medicine daily to prevent HIV infection. This is called pre-exposure prophylaxis (PrEP). You are considered at risk if: ? You are sexually active and do not regularly use condoms or know the HIV status of your partner(s). ? You take drugs by injection. ? You are sexually active with a partner who has HIV.  Talk with your health care provider about whether you are at high risk of being infected with HIV. If you choose to begin PrEP, you should first be tested for HIV. You should then be tested every 3 months for as long as you are taking PrEP. Pregnancy  If you are premenopausal and you may become pregnant, ask your health care provider about preconception counseling.  If you may become pregnant, take 400 to 800 micrograms (mcg) of folic acid every day.  If you want to prevent pregnancy, talk to your health care provider about birth control (contraception). Osteoporosis and menopause  Osteoporosis is a disease in which the bones lose minerals and strength with aging. This  can result in serious bone fractures. Your risk for osteoporosis can be identified using a bone density scan.  If you are 71 years of age or older, or if you are at risk for osteoporosis and fractures, ask your health care provider if you should be screened.  Ask your health care provider whether you should take a calcium or vitamin D supplement to lower your risk for osteoporosis.  Menopause may have certain physical symptoms and risks.  Hormone replacement therapy may reduce some of these symptoms and risks. Talk to your health care provider about whether hormone replacement therapy is right for you. Follow these instructions at home:  Schedule regular health, dental, and eye exams.  Stay current with your immunizations.  Do not use any tobacco products including cigarettes, chewing tobacco, or electronic cigarettes.  If you are pregnant, do not drink alcohol.  If you are breastfeeding, limit how much and how often you drink alcohol.  Limit alcohol intake to no more than 1 drink per day for nonpregnant women. One drink equals 12 ounces of beer, 5 ounces of wine, or 1 ounces of hard liquor.  Do not use street drugs.  Do not share needles.  Ask your health care provider for help if you need support or information about quitting drugs.  Tell your health care provider if you often feel depressed.  Tell your health care provider if you have ever been abused or do not feel safe at home. This information is not intended to replace advice given to you by your health care provider. Make sure you discuss any questions you have with your health care provider. Document Released: 01/19/2011 Document Revised: 12/12/2015 Document Reviewed: 04/09/2015 Elsevier Interactive Patient Education  [employer].

## 2017-05-06 NOTE — Progress Notes (Addendum)
Subjective:   Megan Olsen is a 83 y.o. female who presents for Medicare Annual (Subsequent) preventive examination.  The Patient was informed that the wellness visit is to identify future health risk and educate and initiate measures that can reduce risk for increased disease through the lifespan.    Annual Wellness Assessment  Reports health as good As a one room apt; [employer] on the 1st floor Been there x 7 years Niece; no family  Was in [employer] [place] week in [place] driving to the store  Her niece is visiting some [place] but she has not decided to move. Feels she may move if her vision gets worse or she cannot drive   Preventive Screening -Counseling & Management  Medicare Annual Preventive Care Visit - Subsequent Last OV 04/14/2017  Osteoporosis  States she fell in her den after turning around  Had THR on 02/23/2017  There are no preventive care reminders to display for this patient.   VS reviewed;  Rate was 115 but rechecked apical and radial at 92  Noted to be irreg No c/o of dizziness or flutter etc     Diet  Usually has oatmeal or omelette or toast Enjoys eating Lunch; eats breakfast late Supper vegetable or meat and salad  Has this around 3 or 4pm A snack at hs   BMI 19   Exercise She has been doing her exercises at home Typical day; Gets up around 7:30; has tea or coffee;  Don't rush in to anything but does clean, buy groceries   Dental -she has her own teeth No complaints  Stressors: doing well  When she fell, her neighbor came over  Does not worry about getting help if needed  Used to go out to social things but vision is not as good anymore  Does not drive at hs but states the "shots" are helping her to see better   Hearing Screening Comments: States she can't hear as well but she is not worried about it May have hearing checked   Vision Screening Comments: Has MD  [facility]  Is getting shots now Given  resource for free hearing aid   Sleep patterns: off and on but most of the time does well  Pain; is not taking any pain med     Cardiac Risk Factors Addressed Hyperlipidemia - good; no issues  Diabetes -neg  Advanced Directives - completed   Patient Care Team: Marletta Lor, MD as PCP - General (Internal Medicine)  Cardiac Risk Factors include: family history of premature cardiovascular disease     Objective:     Vitals: BP 138/68   Pulse (!) 56   Ht 5\' 4"  (1.626 m)   Wt 112 lb 2 oz (50.9 kg)   SpO2 98%   BMI 19.25 kg/m   Body mass index is 19.25 kg/m.   Tobacco History  Smoking Status  . Never Smoker  Smokeless Tobacco  . Never Used     Counseling given: Yes   Past Medical History:  Diagnosis Date  . Atrial fibrillation ([place])    catheter ablation of SVT in 2002  . CVA (cerebrovascular accident) ([place]) 2003   left brain  . Depression   . GERD (gastroesophageal reflux disease)   . Iron deficiency anemia   . Osteoarthritis   . Osteoporosis   . Right BBB/left ant fasc block    Past Surgical History:  Procedure Laterality Date  . ABDOMINAL HYSTERECTOMY    .  CHOLECYSTECTOMY    . COLONOSCOPY    . HIP PINNING,CANNULATED Left 07/07/2014   Procedure: CANNULATED HIP PINNING;  Surgeon: Mauri Pole, MD;  Location: [facility];  Service: Orthopedics;  Laterality: Left;  . LAPAROSCOPIC OVARIAN CYSTECTOMY    . right eye surgery  2008   dr Zadie Rhine.  Vitrectomy and removal of tissue.   Marland Kitchen TOTAL ABDOMINAL HYSTERECTOMY    . TOTAL HIP ARTHROPLASTY Right 02/23/2017   Procedure: RIGHT TOTAL HIP ARTHROPLASTY ANTERIOR APPROACH;  Surgeon: Paralee Cancel, MD;  Location: [facility];  Service: Orthopedics;  Laterality: Right;   No family history on file. History  Sexual Activity  . Sexual activity: No    Outpatient Encounter Prescriptions as of 05/06/2017  Medication Sig  . bisoprolol-hydrochlorothiazide (ZIAC) 5-6.25 MG tablet Take 1 tablet by mouth daily.  .  cholecalciferol (VITAMIN D) 1000 units tablet Take 1 tablet (1,000 Units total) by mouth daily with breakfast.  . dexlansoprazole (DEXILANT) 60 MG capsule Take 1 capsule (60 mg total) by mouth daily.  Marland Kitchen estradiol (ESTRACE) 1 MG tablet Take 1 tablet (1 mg total) by mouth daily.  Marland Kitchen LORazepam (ATIVAN) 1 MG tablet TAKE 1/2 TABLET IN THE MORNING AND 1 TABLET AT BEDTIME  . Multiple Vitamins-Minerals (PRESERVISION AREDS 2) CAPS Take 1 capsule by mouth 2 (two) times daily.  Marland Kitchen aspirin 81 MG chewable tablet Chew 1 tablet (81 mg total) by mouth 2 (two) times daily. Take for 4 weeks. (Patient not taking: Reported on 05/06/2017)  . docusate sodium (COLACE) 100 MG capsule Take 1 capsule (100 mg total) by mouth 2 (two) times daily. (Patient not taking: Reported on 05/06/2017)  . ferrous sulfate (FERROUSUL) 325 (65 FE) MG tablet Take 1 tablet (325 mg total) by mouth 3 (three) times daily with meals. (Patient not taking: Reported on 05/06/2017)  . HYDROcodone-acetaminophen (NORCO) 7.5-325 MG tablet Take 1-2 tablets by mouth every 4 (four) hours as needed for moderate pain or severe pain. (Patient not taking: Reported on 05/06/2017)  . ketotifen (ZADITOR) 0.025 % ophthalmic solution Place 1 drop into both eyes as needed (dry eyes). (Patient not taking: Reported on 05/06/2017)  . polyethylene glycol (MIRALAX / GLYCOLAX) packet Take 17 g by mouth 2 (two) times daily. (Patient not taking: Reported on 05/06/2017)   No facility-administered encounter medications on file as of 05/06/2017.     Activities of Daily Living In your present state of health, do you have any difficulty performing the following activities: 05/06/2017 02/23/2017  Hearing? [name]  Vision? [name]  Comment currently under treatment -  Difficulty concentrating or making decisions? N N  Walking or climbing stairs? Y Y  Dressing or bathing? N N  Doing errands, shopping? N N  Preparing Food and eating ? N -  Using the Toilet? N -  In the past six months,  have you accidently leaked urine? Y -  Do you have problems with loss of bowel control? N -  Managing your Medications? N -  Managing your Finances? N -  Housekeeping or managing your Housekeeping? N -  Some recent data might be hidden    Patient Care Team: Marletta Lor, MD as PCP - General (Internal Medicine)    Assessment:     Exercise Activities and Dietary recommendations Current Exercise Habits: The patient does not participate in regular exercise at present (the patient is walking )  Goals    . Exercise 150 minutes per week (moderate activity)  Can start going to the [facility] which is near you Will make a plan to get our more when her hip is better       Fall Risk Fall Risk  05/06/2017 08/28/2015 07/27/2014 08/11/2013 04/10/2013  Falls in the past year? Yes No Yes No No  Number falls in past yr: 1 - 1 - -  Injury with Fall? Yes - Yes - -  Risk Factor Category  - - High Fall Risk - -  Risk for fall due to : Impaired balance/gait - Impaired balance/gait - Impaired balance/gait;Impaired mobility  Follow up Education provided - - - -   Depression Screen PHQ 2/9 Scores 05/06/2017 08/28/2015 07/27/2014 08/11/2013  PHQ - 2 Score 1 0 0 0     Cognitive Function Ad8 score reviewed for issues:  Issues making decisions:  Less interest in hobbies / activities:  Repeats questions, stories (family complaining):  Trouble using ordinary gadgets (microwave, computer, phone):  Forgets the month or year:   Mismanaging finances:   Remembering appts:  Daily problems with thinking and/or memory: Ad8 score is=0 Younger sisters both had alz          Immunization History  Administered Date(s) Administered  . Influenza Split 04/24/2011, 05/02/2012  . Influenza Whole 04/19/1998, 06/03/2007, 05/08/2008, 04/03/2010  . Influenza, High Dose Seasonal PF 05/31/2015, 05/21/2016, 04/14/2017  . Influenza,inj,Quad PF,6+ Mos 04/10/2013, 03/30/2014  . Pneumococcal  Conjugate-13 05/31/2015  . Pneumococcal Polysaccharide-23 07/20/1996, 05/02/2012  . Tdap 05/02/2012  . Zoster 04/03/2010   Screening Tests Health Maintenance  Topic Date Due  . TETANUS/TDAP  05/02/2022  . INFLUENZA VACCINE  Completed  . DEXA SCAN  Completed  . 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 Given resources to learn more about the Assisted Living communities in the area. 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  No memory issues noted   Next PCP apt [facility] Was seen in Sept        I have personally reviewed and noted the following in the patient's chart:   . Medical and social history . Use of alcohol, tobacco or illicit drugs  . Current medications and supplements . Functional ability and status . Nutritional status . Physical activity . Advanced directives . List of other physicians . Hospitalizations, surgeries, and ER visits in previous 12 months . Vitals . Screenings to include cognitive, depression, and falls . Referrals and appointments  In addition, I have reviewed and discussed with patient certain preventive protocols, quality metrics, and best practice recommendations. A written personalized care plan for preventive services as well as general preventive health recommendations were provided to patient.     CHENI,DPOEU,  RN  05/06/2017  Results of the comprehensive subsequent  Medicare wellness visit reviewed and agree with findings.  Nyoka Cowden

## 2017-06-09 DIAGNOSIS — H353212 Exudative age-related macular degeneration, right eye, with inactive choroidal neovascularization: Secondary | ICD-10-CM | POA: Diagnosis not present

## 2017-06-09 DIAGNOSIS — H353211 Exudative age-related macular degeneration, right eye, with active choroidal neovascularization: Secondary | ICD-10-CM | POA: Diagnosis not present

## 2017-07-21 DIAGNOSIS — H353231 Exudative age-related macular degeneration, bilateral, with active choroidal neovascularization: Secondary | ICD-10-CM | POA: Diagnosis not present

## 2017-07-21 DIAGNOSIS — H353211 Exudative age-related macular degeneration, right eye, with active choroidal neovascularization: Secondary | ICD-10-CM | POA: Diagnosis not present

## 2017-07-21 DIAGNOSIS — H353221 Exudative age-related macular degeneration, left eye, with active choroidal neovascularization: Secondary | ICD-10-CM | POA: Diagnosis not present

## 2017-08-19 ENCOUNTER — Other Ambulatory Visit: Payer: Self-pay | Admitting: Internal Medicine

## 2017-08-20 ENCOUNTER — Other Ambulatory Visit: Payer: Self-pay | Admitting: Internal Medicine

## 2017-08-20 MED ORDER — LORAZEPAM 1 MG PO TABS: ORAL_TABLET | ORAL | 0 refills | Status: DC

## 2017-09-07 DIAGNOSIS — H353211 Exudative age-related macular degeneration, right eye, with active choroidal neovascularization: Secondary | ICD-10-CM | POA: Diagnosis not present

## 2017-09-07 DIAGNOSIS — H353221 Exudative age-related macular degeneration, left eye, with active choroidal neovascularization: Secondary | ICD-10-CM | POA: Diagnosis not present

## 2017-09-27 ENCOUNTER — Other Ambulatory Visit: Payer: Self-pay | Admitting: Internal Medicine

## 2017-10-16 IMAGING — DX DG HIP (WITH OR WITHOUT PELVIS) 2-3V*R*
2 series · 2 of 2 positions shown · non-contrast
Comparison: None.

CLINICAL DATA: Right hip pain for 6 months without known injury.

EXAM:
DG HIP (WITH OR WITHOUT PELVIS) 2-3V RIGHT

[hip ap]
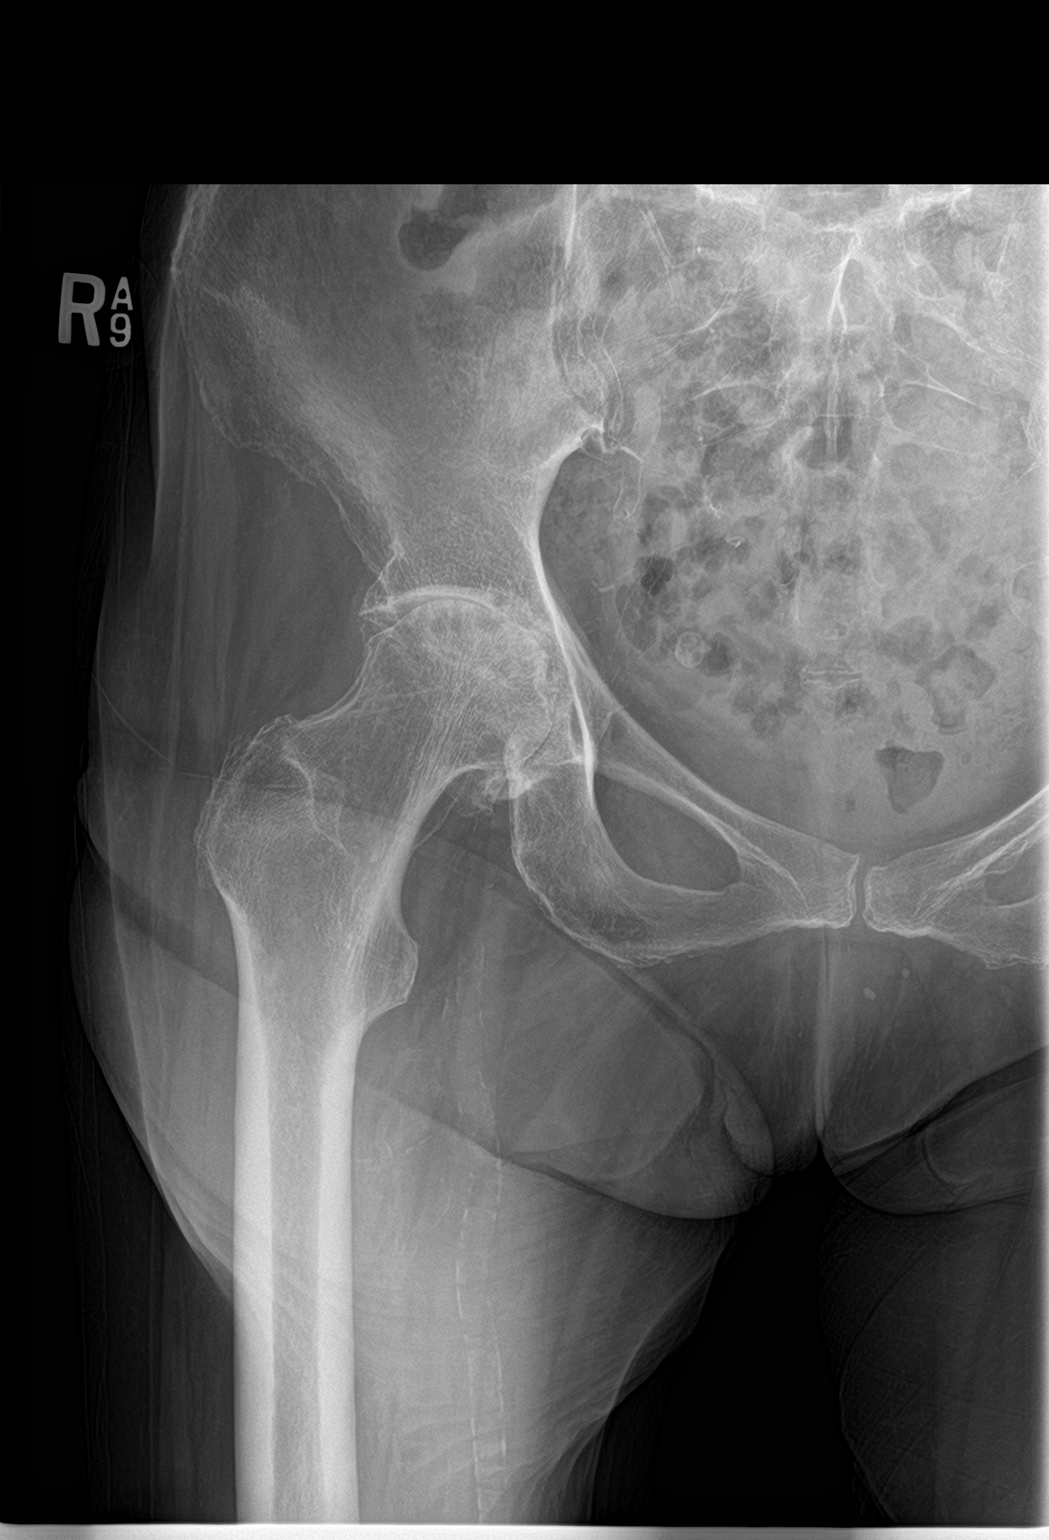

[hip lat]
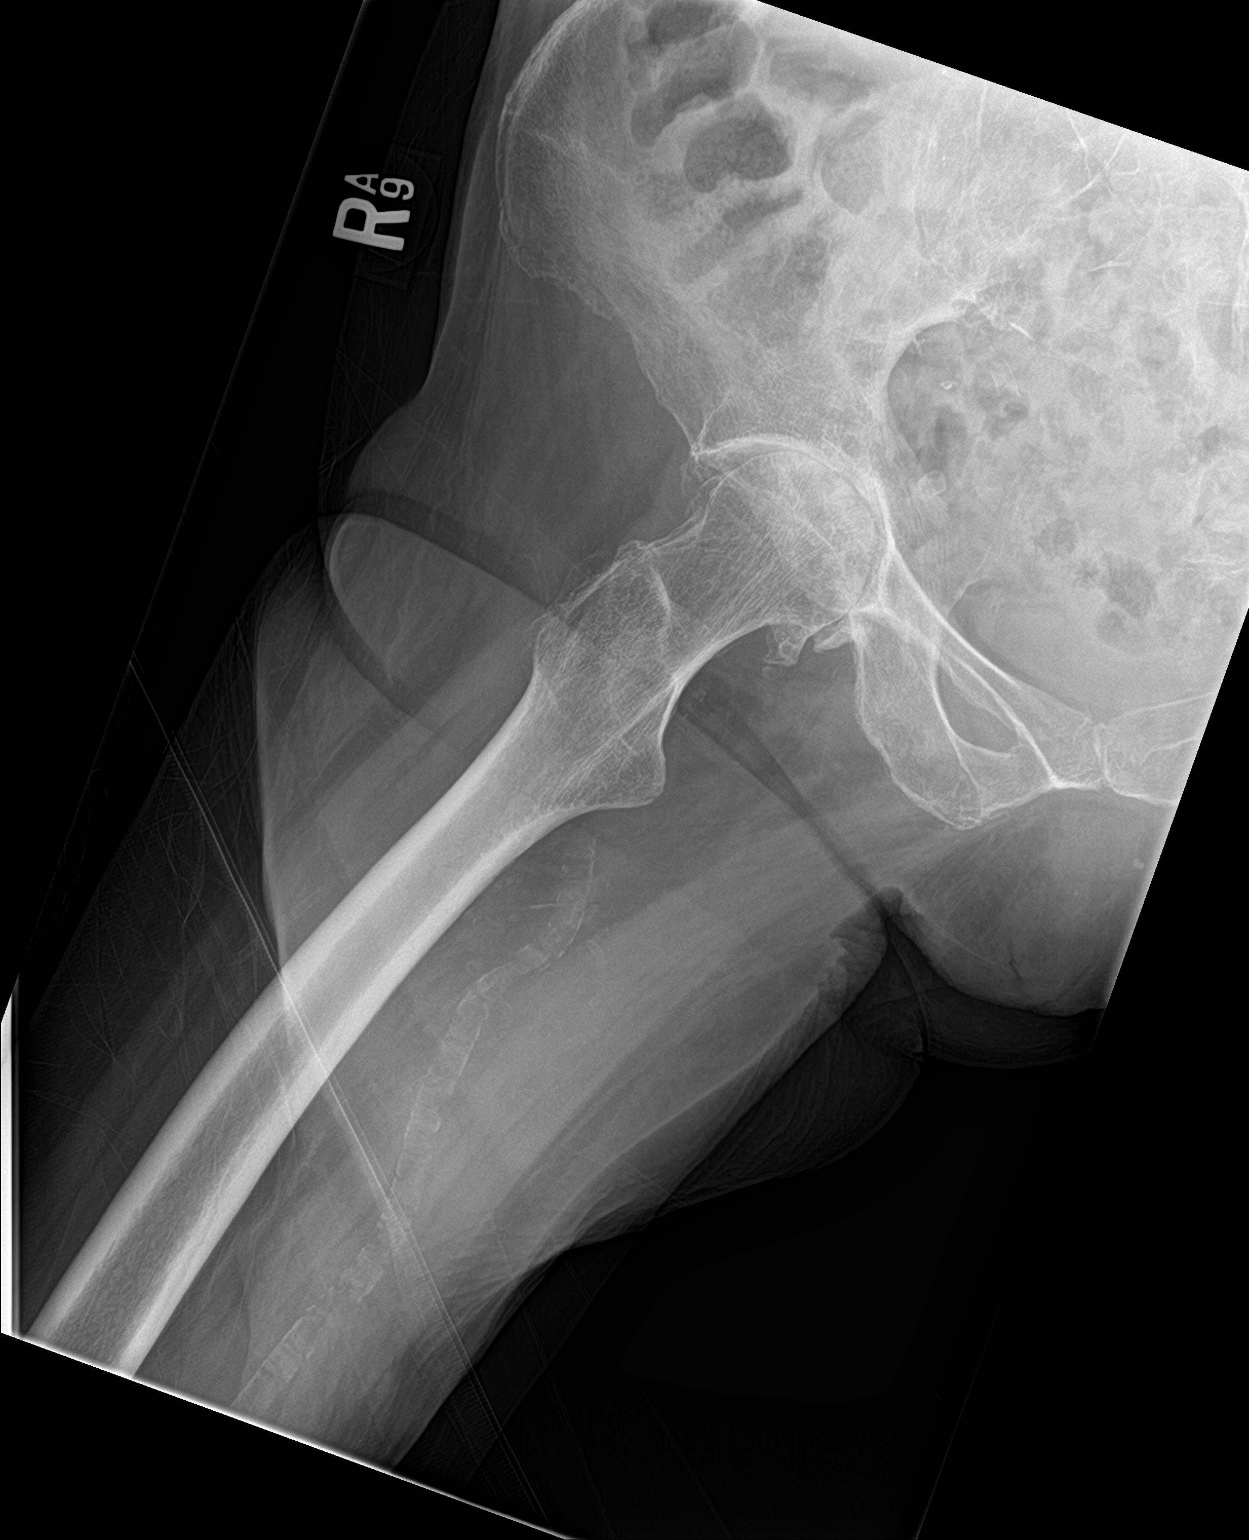

[2 of 2 positions shown; findings below may reference images not displayed]

FINDINGS: No fracture or dislocation is noted. Severe narrowing of the right
hip joint is noted consistent with degenerative joint disease.
Lucency and sclerosis is noted in the right femoral head consistent
with avascular necrosis.
IMPRESSION: Severe degenerative joint disease of the right hip. Avascular
necrosis of right femoral head is noted.

## 2017-10-28 DIAGNOSIS — H353231 Exudative age-related macular degeneration, bilateral, with active choroidal neovascularization: Secondary | ICD-10-CM | POA: Diagnosis not present

## 2017-10-28 DIAGNOSIS — H353221 Exudative age-related macular degeneration, left eye, with active choroidal neovascularization: Secondary | ICD-10-CM | POA: Diagnosis not present

## 2017-10-28 DIAGNOSIS — H353211 Exudative age-related macular degeneration, right eye, with active choroidal neovascularization: Secondary | ICD-10-CM | POA: Diagnosis not present

## 2017-11-10 ENCOUNTER — Other Ambulatory Visit: Payer: Self-pay | Admitting: Internal Medicine

## 2017-11-22 DIAGNOSIS — H353231 Exudative age-related macular degeneration, bilateral, with active choroidal neovascularization: Secondary | ICD-10-CM | POA: Diagnosis not present

## 2017-11-22 DIAGNOSIS — Z961 Presence of intraocular lens: Secondary | ICD-10-CM | POA: Diagnosis not present

## 2017-11-22 DIAGNOSIS — H353211 Exudative age-related macular degeneration, right eye, with active choroidal neovascularization: Secondary | ICD-10-CM | POA: Diagnosis not present

## 2017-11-22 DIAGNOSIS — H353221 Exudative age-related macular degeneration, left eye, with active choroidal neovascularization: Secondary | ICD-10-CM | POA: Diagnosis not present

## 2017-11-22 DIAGNOSIS — H35033 Hypertensive retinopathy, bilateral: Secondary | ICD-10-CM | POA: Diagnosis not present

## 2017-11-23 ENCOUNTER — Ambulatory Visit (INDEPENDENT_AMBULATORY_CARE_PROVIDER_SITE_OTHER): Payer: Medicare HMO | Admitting: Internal Medicine

## 2017-11-23 ENCOUNTER — Other Ambulatory Visit: Payer: Self-pay

## 2017-11-23 ENCOUNTER — Encounter: Payer: Self-pay | Admitting: Internal Medicine

## 2017-11-23 VITALS — BP 160/80 | HR 64 | Temp 97.6°F | Wt 120.6 lb

## 2017-11-23 DIAGNOSIS — F411 Generalized anxiety disorder: Secondary | ICD-10-CM

## 2017-11-23 DIAGNOSIS — I10 Essential (primary) hypertension: Secondary | ICD-10-CM

## 2017-11-23 DIAGNOSIS — I480 Paroxysmal atrial fibrillation: Secondary | ICD-10-CM

## 2017-11-23 DIAGNOSIS — E039 Hypothyroidism, unspecified: Secondary | ICD-10-CM

## 2017-11-23 MED ORDER — LORAZEPAM 0.5 MG PO TABS: ORAL_TABLET | ORAL | 2 refills | Status: DC

## 2017-11-23 NOTE — Progress Notes (Signed)
   Subjective:    Patient ID: Megan Olsen, female    DOB: 07/02/1928, 84 y.o.   MRN: [id number]  HPI  84 year old patient who is seen today for her biannual follow-up.  She has a history of essential hypertension as well as atrial fibrillation.  Complaints today include insomnia and some discomfort involving her left ear which largely has resolved.  She has a history of hypothyroidism and remains on levothyroxine.  She has a history of anxiety disorder remains on lorazepam as needed.   Review of Systems  Constitutional: Negative.   HENT: Positive for ear pain. Negative for congestion, dental problem, hearing loss, rhinorrhea, sinus pressure, sore throat and tinnitus.   Eyes: Negative for pain, discharge and visual disturbance.  Respiratory: Negative for cough and shortness of breath.   Cardiovascular: Negative for chest pain, palpitations and leg swelling.  Gastrointestinal: Negative for abdominal distention, abdominal pain, blood in stool, constipation, diarrhea, nausea and vomiting.  Genitourinary: Negative for difficulty urinating, dysuria, flank pain, frequency, hematuria, pelvic pain, urgency, vaginal bleeding, vaginal discharge and vaginal pain.  Musculoskeletal: Negative for arthralgias, gait problem and joint swelling.  Skin: Negative for rash.  Neurological: Negative for dizziness, syncope, speech difficulty, weakness, numbness and headaches.  Hematological: Negative for adenopathy.  Psychiatric/Behavioral: Positive for sleep disturbance. Negative for agitation, behavioral problems and dysphoric mood. The patient is nervous/anxious.        Objective:   Physical Exam  Constitutional: She is oriented to person, place, and time. She appears well-developed and well-nourished.  Blood pressure 130/70  HENT:  Head: Normocephalic.  Right Ear: External ear normal.  Left Ear: External ear normal.  Mouth/Throat: Oropharynx is clear and moist.  Eyes: Pupils are equal, round, and  reactive to light. Conjunctivae and EOM are normal.  Neck: Normal range of motion. Neck supple. No thyromegaly present.  Cardiovascular: Normal rate, regular rhythm, normal heart sounds and intact distal pulses.  Pulmonary/Chest: Effort normal and breath sounds normal.  Abdominal: Soft. Bowel sounds are normal. She exhibits no mass. There is no tenderness.  Musculoskeletal: Normal range of motion.  Lymphadenopathy:    She has no cervical adenopathy.  Neurological: She is alert and oriented to person, place, and time.  Skin: Skin is warm and dry. No rash noted.  Psychiatric: She has a normal mood and affect. Her behavior is normal.          Assessment & Plan:   Essential hypertension well-controlled Anxiety disorder History of atrial fibrillation Hypothyroidism.  Continue levothyroxine    Medications updated CPX in 6 months.  Patient plans to establish with a new provider [facility]   [employer]

## 2017-11-23 NOTE — Patient Instructions (Signed)
Limit your sodium (Salt) intake  Please check your blood pressure on a regular basis.  If it is consistently greater than 150/90, please make an office appointment.  Return in 6 months for follow-up   

## 2017-11-25 ENCOUNTER — Telehealth: Payer: Self-pay | Admitting: Internal Medicine

## 2017-11-25 NOTE — Telephone Encounter (Signed)
Pt. Called to report her new dose and frequency of her Ativan "is not working". " I have not slept well since I started it." Request Dr. Burnice Logan put her back on her previous dosage and frequency. Please advise pt.

## 2017-11-25 NOTE — Telephone Encounter (Signed)
Please advise 

## 2017-11-29 NOTE — Telephone Encounter (Signed)
Okay to resume prior dose

## 2017-11-29 NOTE — Telephone Encounter (Signed)
Noted  

## 2017-11-30 NOTE — Telephone Encounter (Signed)
/  Patient was informed that a new rx with previous sig. Will be sent 12/24/2017 since pt has already picked up the rx she will have to wait. Pt stated that she could wait. Pt also wanted to know if she could take two 1/2 tabs a day and go without taking the other .5 until next month. I informed pt that she could run out she stated it's fine. I told pt if she decided to do it she should maybe only take it when she really needs it. Pt will call when its close to time for refill.

## 2017-12-09 DIAGNOSIS — H353211 Exudative age-related macular degeneration, right eye, with active choroidal neovascularization: Secondary | ICD-10-CM | POA: Diagnosis not present

## 2017-12-09 DIAGNOSIS — H353212 Exudative age-related macular degeneration, right eye, with inactive choroidal neovascularization: Secondary | ICD-10-CM | POA: Diagnosis not present

## 2017-12-21 ENCOUNTER — Telehealth: Payer: Self-pay | Admitting: Family Medicine

## 2017-12-21 NOTE — Telephone Encounter (Signed)
Copied from [place] [phone]. Topic: General - Other >> Dec 21, 2017  8:57 AM Carolyn Stare wrote:  Pt call to say she was told to call about her RX LORazepam (ATIVAN) 0.5 MG tablet.   She said her Rx is going to change from 0.5 to 1.0  the 1.0 is what she use to take  Pharmacy [facility]

## 2017-12-22 ENCOUNTER — Other Ambulatory Visit: Payer: Self-pay

## 2017-12-22 MED ORDER — LORAZEPAM 0.5 MG PO TABS: ORAL_TABLET | ORAL | 2 refills | Status: DC

## 2017-12-22 NOTE — Telephone Encounter (Signed)
Left detailed message informing pt of update. 

## 2017-12-23 NOTE — Telephone Encounter (Signed)
Pt states that she is wanting to get a total of 1.5mg . She said that she she was taking 0.5mg  in the afternoon and 1mg  total at night. She thought this is what Tillie Rung would send in for her. The 0.5mg  tablet is ok but she would like to take 1 0.5mg  tablet in the afternoon and 2 0.5mg  tablets at night. Please advise.

## 2017-12-29 MED ORDER — LORAZEPAM 1 MG PO TABS: ORAL_TABLET | ORAL | 0 refills | Status: DC

## 2017-12-29 NOTE — Telephone Encounter (Addendum)
[facility] returned call. Patient now reports that med was received at pharmacy, but dose is incorrect.  She was supposed to be changed back to her "old dose" but rx was sent for 0.5 mg. Reviewed chart and spoke w/ patient.  Per 11/25/17 phone note, okay for patient to resume previous dosing due to med being ineffective at lower dose. Verified with patient that the previous dose she is referencing is 1 mg tablets, 0.5 tablets in the morning and 1 tablet at bedtime.  New rx sent to pharmacy with correct dose/sig. Patient already picked up previous script, so she will take 1 tablet in the morning and 2 at bedtime to equal the correct dosing and then pick up the new 1 mg rx when she runs out.

## 2017-12-29 NOTE — Telephone Encounter (Signed)
[facility]. Fax confirmation received.  Received call from [facility] agent Teresa-patient stating she was at her 'wits end' trying to get this med refilled. Notified Helene Kelp that med was filled 12/22/17 and she will notify patient.

## 2017-12-29 NOTE — Addendum Note (Signed)
Addended by: Dorrene German on: 12/29/2017 05:04 PM   Modules accepted: Orders

## 2018-01-13 ENCOUNTER — Telehealth: Payer: Self-pay | Admitting: Internal Medicine

## 2018-01-13 NOTE — Telephone Encounter (Signed)
Noted  

## 2018-01-13 NOTE — Telephone Encounter (Signed)
Patient dropped off disability placard  Call patient to pick up at: [phone]  Disposition: Dr's folder

## 2018-01-13 NOTE — Telephone Encounter (Signed)
Patient notified of results and verbalized understanding.  

## 2018-01-25 DIAGNOSIS — H353211 Exudative age-related macular degeneration, right eye, with active choroidal neovascularization: Secondary | ICD-10-CM | POA: Diagnosis not present

## 2018-01-25 DIAGNOSIS — H353221 Exudative age-related macular degeneration, left eye, with active choroidal neovascularization: Secondary | ICD-10-CM | POA: Diagnosis not present

## 2018-01-25 DIAGNOSIS — H353231 Exudative age-related macular degeneration, bilateral, with active choroidal neovascularization: Secondary | ICD-10-CM | POA: Diagnosis not present

## 2018-01-26 ENCOUNTER — Ambulatory Visit (INDEPENDENT_AMBULATORY_CARE_PROVIDER_SITE_OTHER): Payer: Medicare HMO

## 2018-01-26 ENCOUNTER — Encounter: Payer: Self-pay | Admitting: Family Medicine

## 2018-01-26 ENCOUNTER — Ambulatory Visit (INDEPENDENT_AMBULATORY_CARE_PROVIDER_SITE_OTHER): Payer: Medicare HMO | Admitting: Family Medicine

## 2018-01-26 VITALS — BP 138/62 | HR 40 | Temp 97.8°F | Ht 64.0 in | Wt 120.2 lb

## 2018-01-26 DIAGNOSIS — M50222 Other cervical disc displacement at C5-C6 level: Secondary | ICD-10-CM | POA: Diagnosis not present

## 2018-01-26 DIAGNOSIS — G4700 Insomnia, unspecified: Secondary | ICD-10-CM | POA: Insufficient documentation

## 2018-01-26 DIAGNOSIS — E039 Hypothyroidism, unspecified: Secondary | ICD-10-CM

## 2018-01-26 DIAGNOSIS — H3530 Unspecified macular degeneration: Secondary | ICD-10-CM

## 2018-01-26 DIAGNOSIS — M50223 Other cervical disc displacement at C6-C7 level: Secondary | ICD-10-CM | POA: Diagnosis not present

## 2018-01-26 DIAGNOSIS — D508 Other iron deficiency anemias: Secondary | ICD-10-CM | POA: Diagnosis not present

## 2018-01-26 DIAGNOSIS — K5904 Chronic idiopathic constipation: Secondary | ICD-10-CM | POA: Diagnosis not present

## 2018-01-26 DIAGNOSIS — M542 Cervicalgia: Secondary | ICD-10-CM | POA: Diagnosis not present

## 2018-01-26 DIAGNOSIS — M816 Localized osteoporosis [Lequesne]: Secondary | ICD-10-CM

## 2018-01-26 DIAGNOSIS — J301 Allergic rhinitis due to pollen: Secondary | ICD-10-CM

## 2018-01-26 DIAGNOSIS — M159 Polyosteoarthritis, unspecified: Secondary | ICD-10-CM

## 2018-01-26 DIAGNOSIS — I10 Essential (primary) hypertension: Secondary | ICD-10-CM

## 2018-01-26 DIAGNOSIS — I482 Chronic atrial fibrillation, unspecified: Secondary | ICD-10-CM

## 2018-01-26 DIAGNOSIS — Z8673 Personal history of transient ischemic attack (TIA), and cerebral infarction without residual deficits: Secondary | ICD-10-CM

## 2018-01-26 DIAGNOSIS — M150 Primary generalized (osteo)arthritis: Secondary | ICD-10-CM | POA: Diagnosis not present

## 2018-01-26 DIAGNOSIS — F5101 Primary insomnia: Secondary | ICD-10-CM | POA: Diagnosis not present

## 2018-01-26 DIAGNOSIS — Z79899 Other long term (current) drug therapy: Secondary | ICD-10-CM

## 2018-01-26 LAB — COMPREHENSIVE METABOLIC PANEL
ALT: 11 U/L (ref 0–35)
AST: 20 U/L (ref 0–37)
Albumin: 3.7 g/dL (ref 3.5–5.2)
Alkaline Phosphatase: 134 U/L — ABNORMAL HIGH (ref 39–117)
BUN: 22 mg/dL (ref 6–23)
CO2: 27 mEq/L (ref 19–32)
Calcium: 8.7 mg/dL (ref 8.4–10.5)
Chloride: 106 mEq/L (ref 96–112)
Creatinine, Ser: 0.89 mg/dL (ref 0.40–1.20)
GFR: 63.59 mL/min (ref >60.00)
Glucose, Bld: 84 mg/dL (ref 70–99)
Potassium: 4.7 mEq/L (ref 3.5–5.1)
Sodium: 139 mEq/L (ref 135–145)
Total Bilirubin: 0.3 mg/dL (ref 0.2–1.2)
Total Protein: 6.5 g/dL (ref 6.0–8.3)

## 2018-01-26 LAB — CBC WITH DIFFERENTIAL/PLATELET
Basophils Absolute: 0 10*3/uL (ref 0.0–0.1)
Basophils Relative: 0.8 % (ref 0.0–3.0)
Eosinophils Absolute: 0.3 10*3/uL (ref 0.0–0.7)
Eosinophils Relative: 4.5 % (ref 0.0–5.0)
HCT: 33 % — ABNORMAL LOW (ref 36.0–46.0)
Hemoglobin: 10.5 g/dL — ABNORMAL LOW (ref 12.0–15.0)
Lymphocytes Relative: 11.4 % — ABNORMAL LOW (ref 12.0–46.0)
Lymphs Abs: 0.7 10*3/uL (ref 0.7–4.0)
MCHC: 31.9 g/dL (ref 30.0–36.0)
MCV: 79.4 fl (ref 78.0–100.0)
Monocytes Absolute: 0.6 10*3/uL (ref 0.1–1.0)
Monocytes Relative: 11 % (ref 3.0–12.0)
Neutro Abs: 4.2 10*3/uL (ref 1.4–7.7)
Neutrophils Relative %: 72.3 % (ref 43.0–77.0)
Platelets: 188 10*3/uL (ref 150.0–400.0)
RBC: 4.15 Mil/uL (ref 3.87–5.11)
RDW: 18.7 % — ABNORMAL HIGH (ref 11.5–15.5)
WBC: 5.8 10*3/uL (ref 4.0–10.5)

## 2018-01-26 LAB — T4, FREE: Free T4: 0.98 ng/dL (ref 0.60–1.60)

## 2018-01-26 LAB — TSH: TSH: 4.83 u[IU]/mL — ABNORMAL HIGH (ref 0.35–4.50)

## 2018-01-26 MED ORDER — ASPIRIN EC 81 MG PO TBEC: 81.0000 mg | DELAYED_RELEASE_TABLET | Freq: Every day | ORAL | 1 refills | Status: DC

## 2018-01-26 NOTE — Progress Notes (Signed)
Megan Olsen is a 84 y.o. female is here to [facility].   Patient Care Team: Briscoe Deutscher, DO as PCP - General (Family Medicine)   History of Present Illness:   Megan Olsen CMA acting as scribe for Dr. Juleen China.  HPI: Patient comes in today for transfer of care from Dr. Raliegh Ip. He is retiring in September. Patient states that she is having some left side face and head pain. She wonders if it is due to a sinus issue. She has had the pain for awhile. Patient has injections in her eyes for macular degeneration and had an injection yesterday. She is having trouble seeing today.   There are no preventive care reminders to display for this patient.   Depression screen [facility] 2/9 01/26/2018  Decreased Interest 0  Down, Depressed, Hopeless 0  PHQ - 2 Score 0  Altered sleeping 0  Tired, decreased energy 0  Change in appetite 0  Feeling bad or failure about yourself  0  Trouble concentrating 0  Moving slowly or fidgety/restless 0  Suicidal thoughts 0  PHQ-9 Score 0  Difficult doing work/chores Not difficult at all    PMHx, SurgHx, SocialHx, Medications, and Allergies were reviewed in the Visit Navigator and updated as appropriate.   Past Medical History:  Diagnosis Date  . Atrial fibrillation ([place])    catheter ablation of SVT in 2002  . CVA (cerebrovascular accident) ([place]) 2003   left brain  . Depression   . GERD (gastroesophageal reflux disease)   . Iron deficiency anemia   . Osteoarthritis   . Osteoporosis   . Right BBB/left ant fasc block      Past Surgical History:  Procedure Laterality Date  . ABDOMINAL HYSTERECTOMY    . CHOLECYSTECTOMY    . COLONOSCOPY    . HIP PINNING,CANNULATED Left 07/07/2014   Procedure: CANNULATED HIP PINNING;  Surgeon: Mauri Pole, MD;  Location: [facility];  Service: Orthopedics;  Laterality: Left;  . LAPAROSCOPIC OVARIAN CYSTECTOMY    . right eye surgery  2008   dr Zadie Rhine.  Vitrectomy and removal of tissue.   Marland Kitchen TOTAL ABDOMINAL  HYSTERECTOMY    . TOTAL HIP ARTHROPLASTY Right 02/23/2017   Procedure: RIGHT TOTAL HIP ARTHROPLASTY ANTERIOR APPROACH;  Surgeon: Paralee Cancel, MD;  Location: [facility];  Service: Orthopedics;  Laterality: Right;    History reviewed. No pertinent family history.  Social History   Tobacco Use  . Smoking status: Never Smoker  . Smokeless tobacco: Never Used  Substance Use Topics  . Alcohol use: Yes    Comment: social ; seldom   . Drug use: No    Current Medications and Allergies:   .  cholecalciferol (VITAMIN D) 1000 units tablet, Take 1 tablet (1,000 Units total) by mouth daily with breakfast., Disp: 90 tablet, Rfl: 3 .  dexlansoprazole (DEXILANT) 60 MG capsule, Take 1 capsule (60 mg total) by mouth daily., Disp: 90 capsule, Rfl: 3 .  LORazepam (ATIVAN) 1 MG tablet, Take 0.5 tablets (0.5 mg total) by mouth every morning AND 1 tablet (1 mg total) at bedtime., Disp: 45 tablet, Rfl: 0 .  Multiple Vitamins-Minerals (PRESERVISION AREDS 2) CAPS, Take 1 capsule by mouth 2 (two) times daily., Disp: 90 capsule, Rfl: 3 .  aspirin EC 81 MG tablet, Take 1 tablet (81 mg total) by mouth daily., Disp: 90 tablet, Rfl: 1   Allergies  Allergen Reactions  . Propoxyphene N-Acetaminophen Nausea Only  . Celecoxib Itching and Rash  . Fexofenadine Rash  .  Penicillins Hives and Rash    Has patient had a PCN reaction causing immediate rash, facial/tongue/throat swelling, SOB or lightheadedness with hypotension: Yes Has patient had a PCN reaction causing severe rash involving mucus membranes or skin necrosis: No Has patient had a PCN reaction that required hospitalization: No Has patient had a PCN reaction occurring within the last 10 years: No If all of the above answers are "NO", then may proceed with Cephalosporin use.    Review of Systems:   Pertinent items are noted in the HPI. Otherwise, ROS is negative.  Vitals:   Vitals:   01/26/18 1046  BP: 138/62  Pulse: (!) 40  Temp: 97.8 F (36.6 C)    TempSrc: Oral  SpO2: 93%  Weight: 120 lb 3.2 oz (54.5 kg)  Height: 5\' 4"  (1.626 m)     Body mass index is 20.63 kg/m.  Physical Exam:   Physical Exam  Constitutional: She appears well-nourished.  HENT:  Head: Normocephalic and atraumatic.  Eyes: Pupils are equal, round, and reactive to light. EOM are normal.  Neck: Normal range of motion. Neck supple.  Cardiovascular: Intact distal pulses. An irregular rhythm present. Bradycardia present.  Pulmonary/Chest: Effort normal.  Abdominal: Soft.  Skin: Skin is warm.  Psychiatric: She has a normal mood and affect. Her behavior is normal.  Nursing note and vitals reviewed.   Results for orders placed or performed in visit on 01/26/18  CBC with Differential/Platelet  Result Value Ref Range   WBC 5.8 4.0 - 10.5 K/uL   RBC 4.15 3.87 - 5.11 Mil/uL   Hemoglobin 10.5 (L) 12.0 - 15.0 g/dL   HCT 33.0 (L) 36.0 - 46.0 %   MCV 79.4 78.0 - 100.0 fl   MCHC 31.9 30.0 - 36.0 g/dL   RDW 18.7 (H) 11.5 - 15.5 %   Platelets 188.0 150.0 - 400.0 K/uL   Neutrophils Relative % 72.3 43.0 - 77.0 %   Lymphocytes Relative 11.4 (L) 12.0 - 46.0 %   Monocytes Relative 11.0 3.0 - 12.0 %   Eosinophils Relative 4.5 0.0 - 5.0 %   Basophils Relative 0.8 0.0 - 3.0 %   Neutro Abs 4.2 1.4 - 7.7 K/uL   Lymphs Abs 0.7 0.7 - 4.0 K/uL   Monocytes Absolute 0.6 0.1 - 1.0 K/uL   Eosinophils Absolute 0.3 0.0 - 0.7 K/uL   Basophils Absolute 0.0 0.0 - 0.1 K/uL  Comprehensive metabolic panel  Result Value Ref Range   Sodium 139 135 - 145 mEq/L   Potassium 4.7 3.5 - 5.1 mEq/L   Chloride 106 96 - 112 mEq/L   CO2 27 19 - 32 mEq/L   Glucose, Bld 84 70 - 99 mg/dL   BUN 22 6 - 23 mg/dL   Creatinine, Ser 0.89 0.40 - 1.20 mg/dL   Total Bilirubin 0.3 0.2 - 1.2 mg/dL   Alkaline Phosphatase 134 (H) 39 - 117 U/L   AST 20 0 - 37 U/L   ALT 11 0 - 35 U/L   Total Protein 6.5 6.0 - 8.3 g/dL   Albumin 3.7 3.5 - 5.2 g/dL   Calcium 8.7 8.4 - 10.5 mg/dL   GFR 63.59 >60.00 mL/min   TSH  Result Value Ref Range   TSH 4.83 (H) 0.35 - 4.50 uIU/mL  T4, free  Result Value Ref Range   Free T4 0.98 0.60 - 1.60 ng/dL   EKG: atrial fibrillation, rate 42.  Assessment and Plan:   Megan Olsen was seen today for establish care.  Diagnoses and all orders for this visit:  Chronic atrial fibrillation ([place]) Comments: Bradycardia with atrial fibrillation today. On no medications. Will add ASA today and ask Cardiology to evaluate. May need pacer/defib. Orders: -     EKG 12-Lead -     aspirin EC 81 MG tablet; Take 1 tablet (81 mg total) by mouth daily. -     Ambulatory referral to Cardiology  Seasonal allergic rhinitis due to pollen  Primary osteoarthritis involving multiple joints  Localized osteoporosis without current pathological fracture Comments: Not currently on medication. Will see if she is a candidate for Prolia.   Chronic idiopathic constipation  Neck pain on left side Comments: Left "ear" pain is c/w radiating pain from neck. Poor ROM. + Crepitus. Xray pending.  Orders: -     DG Cervical Spine Complete  Primary insomnia, takes Ativan each night  Macular degeneration of both eyes, unspecified type Comments: Bilateral injections q 7 weeks.  History of CVA (cerebrovascular accident), without residual effects Comments: Not currently on any anticoagulation. No Hx of fall. Hx of hip fracture due to twisting motion. Lives alone. Orders: -     Ambulatory referral to Cardiology  Acquired hypothyroidism -     TSH -     T4, free  Benign essential HTN -     Ambulatory referral to Cardiology -     Comprehensive metabolic panel  Other iron deficiency anemia -     CBC with Differential/Platelet  High risk medication use, Ativan Comments: Discussed with patient. Risk discussion documented.     . Reviewed expectations re: course of current medical issues. . Discussed self-management of symptoms. . Outlined signs and symptoms indicating need for more  acute intervention. . Patient verbalized understanding and all questions were answered. Marland Kitchen Health Maintenance issues including appropriate healthy diet, exercise, and smoking avoidance were discussed with patient. . See orders for this visit as documented in the electronic medical record. . Patient received an After Visit Summary.  CMA served as Education administrator during this visit. History, Physical, and Plan performed by medical provider. The above documentation has been reviewed and is accurate and complete. Briscoe Deutscher, D.O.  Briscoe Deutscher, DO [place], [place] 02/05/2018  Records requested if needed. Time spent with the patient: 40 minutes, of which >50% was spent in obtaining information about her symptoms, reviewing her previous labs, evaluations, and treatments, counseling her about her condition (please see the discussed topics above), and developing a plan to further investigate it; she had a number of questions which I addressed.

## 2018-01-31 ENCOUNTER — Other Ambulatory Visit: Payer: Self-pay

## 2018-01-31 DIAGNOSIS — M542 Cervicalgia: Secondary | ICD-10-CM

## 2018-02-05 ENCOUNTER — Encounter: Payer: Self-pay | Admitting: Family Medicine

## 2018-02-24 ENCOUNTER — Ambulatory Visit (INDEPENDENT_AMBULATORY_CARE_PROVIDER_SITE_OTHER): Payer: Self-pay | Admitting: Physical Medicine and Rehabilitation

## 2018-03-01 ENCOUNTER — Ambulatory Visit (INDEPENDENT_AMBULATORY_CARE_PROVIDER_SITE_OTHER): Payer: Medicare HMO | Admitting: Physical Medicine and Rehabilitation

## 2018-03-01 ENCOUNTER — Encounter (INDEPENDENT_AMBULATORY_CARE_PROVIDER_SITE_OTHER): Payer: Self-pay | Admitting: Physical Medicine and Rehabilitation

## 2018-03-01 VITALS — BP 141/57 | HR 42 | Temp 97.5°F | Ht 67.0 in | Wt 119.4 lb

## 2018-03-01 DIAGNOSIS — M47812 Spondylosis without myelopathy or radiculopathy, cervical region: Secondary | ICD-10-CM | POA: Diagnosis not present

## 2018-03-01 DIAGNOSIS — G8929 Other chronic pain: Secondary | ICD-10-CM | POA: Diagnosis not present

## 2018-03-01 DIAGNOSIS — M25512 Pain in left shoulder: Secondary | ICD-10-CM | POA: Diagnosis not present

## 2018-03-01 DIAGNOSIS — M4312 Spondylolisthesis, cervical region: Secondary | ICD-10-CM | POA: Diagnosis not present

## 2018-03-01 DIAGNOSIS — M542 Cervicalgia: Secondary | ICD-10-CM

## 2018-03-01 DIAGNOSIS — M7918 Myalgia, other site: Secondary | ICD-10-CM | POA: Diagnosis not present

## 2018-03-01 DIAGNOSIS — H9202 Otalgia, left ear: Secondary | ICD-10-CM

## 2018-03-01 NOTE — Progress Notes (Signed)
.  Numeric Pain Rating Scale and Functional Assessment Average Pain 7 Pain Right Now 7 My pain is constant, sharp and aching Pain is worse with: some activites Pain improves with: rest   In the last MONTH (on 0-10 scale) has pain interfered with the following?  1. General activity like being  able to carry out your everyday physical activities such as walking, climbing stairs, carrying groceries, or moving a chair?  Rating(5)  2. Relation with others like being able to carry out your usual social activities and roles such as  activities at home, at work and in your community. Rating(4)  3. Enjoyment of life such that you have  been bothered by emotional problems such as feeling anxious, depressed or irritable?  Rating(4)

## 2018-03-07 ENCOUNTER — Other Ambulatory Visit: Payer: Self-pay | Admitting: Internal Medicine

## 2018-03-07 NOTE — Telephone Encounter (Signed)
Last OV 11/23/17  [place] Controlled Substance Database checked. Last filled on 01/24/18

## 2018-03-09 DIAGNOSIS — H353221 Exudative age-related macular degeneration, left eye, with active choroidal neovascularization: Secondary | ICD-10-CM | POA: Diagnosis not present

## 2018-03-09 DIAGNOSIS — H353211 Exudative age-related macular degeneration, right eye, with active choroidal neovascularization: Secondary | ICD-10-CM | POA: Diagnosis not present

## 2018-03-09 DIAGNOSIS — H353231 Exudative age-related macular degeneration, bilateral, with active choroidal neovascularization: Secondary | ICD-10-CM | POA: Diagnosis not present

## 2018-03-09 DIAGNOSIS — H43813 Vitreous degeneration, bilateral: Secondary | ICD-10-CM | POA: Diagnosis not present

## 2018-03-11 ENCOUNTER — Encounter (INDEPENDENT_AMBULATORY_CARE_PROVIDER_SITE_OTHER): Payer: Self-pay | Admitting: Physical Medicine and Rehabilitation

## 2018-03-11 DIAGNOSIS — M47812 Spondylosis without myelopathy or radiculopathy, cervical region: Secondary | ICD-10-CM | POA: Diagnosis not present

## 2018-03-11 DIAGNOSIS — M542 Cervicalgia: Secondary | ICD-10-CM | POA: Insufficient documentation

## 2018-03-11 DIAGNOSIS — H9202 Otalgia, left ear: Secondary | ICD-10-CM | POA: Diagnosis not present

## 2018-03-11 DIAGNOSIS — M7918 Myalgia, other site: Secondary | ICD-10-CM | POA: Diagnosis not present

## 2018-03-11 DIAGNOSIS — M4312 Spondylolisthesis, cervical region: Secondary | ICD-10-CM | POA: Diagnosis not present

## 2018-03-11 DIAGNOSIS — M25512 Pain in left shoulder: Secondary | ICD-10-CM | POA: Diagnosis not present

## 2018-03-11 DIAGNOSIS — G8929 Other chronic pain: Secondary | ICD-10-CM | POA: Diagnosis not present

## 2018-03-11 MED ORDER — LIDOCAINE HCL 1 % IJ SOLN
3.0000 mL | INTRAMUSCULAR | Status: AC | PRN
  Administered 2018-03-11: 3 mL

## 2018-03-11 MED ORDER — TRIAMCINOLONE ACETONIDE 40 MG/ML IJ SUSP
20.0000 mg | INTRAMUSCULAR | Status: AC | PRN
  Administered 2018-03-11: 20 mg via INTRAMUSCULAR

## 2018-03-11 NOTE — Progress Notes (Signed)
Megan Olsen - 84 y.o. female MRN [id number]  Date of birth: October 11, 1927  Office Visit Note: Visit Date: 03/01/2018 PCP: Briscoe Deutscher, DO Referred by: Briscoe Deutscher, DO  Subjective: Chief Complaint  Patient presents with  . Neck - Pain  . Head - Pain  . Left Shoulder - Pain   HPI: Megan Olsen is a very pleasant 84 year old female that comes in today at the request of Dr. Briscoe Deutscher for evaluation management of chronic left-sided neck pain and left ear pain.  Patient reports really years of neck pain but several months of pain that really began in the left post auricular area of the left ear.  She has not noted any changes in hearing specifically although she says it is difficult hearing at times.  She said the pain can be fairly sharp and aching type pain.  Over time it has sort of increased down the neck and across the trapezius and left shoulder.  She reports laying down actually makes the pain worse and she has a difficult time sleeping.  She rates her average pain is a 7 out of 10.  She denies any paresthesias or numbness down the hands or arms.  She has had no specific trauma.  She has not had specific physical therapy recently.  She has not noted any focal weakness but does have felt weak in general.  She has had no unexplained weight loss but she reports does not have much of an appetite at some settings.  Dr. Juleen China did obtain cervical x-rays and those are reviewed below.  Patient not really reporting shoulder pain with movement but just aching pain in the left shoulder.  She currently does take some meloxicam.  She does have chronic A. fib and a history of stroke.  She is not currently on anticoagulation.  She has not had prior cervical surgery or any injections.  She does use heat and ice.  She does have some level of generalized anxiety.  She does take lorazepam.   Review of Systems  Constitutional: Negative for chills, fever, malaise/fatigue and weight loss.  HENT: Negative  for hearing loss and sinus pain.   Eyes: Negative for blurred vision, double vision and photophobia.  Respiratory: Negative for cough and shortness of breath.   Cardiovascular: Negative for chest pain, palpitations and leg swelling.  Gastrointestinal: Negative for abdominal pain, nausea and vomiting.  Genitourinary: Negative for flank pain.  Musculoskeletal: Positive for neck pain. Negative for myalgias.  Skin: Negative for itching and rash.  Neurological: Negative for tremors, focal weakness and weakness.  Endo/Heme/Allergies: Negative.   Psychiatric/Behavioral: Negative for depression. The patient is nervous/anxious.   All other systems reviewed and are negative.  Otherwise per HPI.  Assessment & Plan: Visit Diagnoses:  1. Cervicalgia   2. Chronic left shoulder pain   3. Left ear pain   4. Myofascial pain syndrome   5. Cervical spondylosis without myelopathy   6. Spondylolisthesis of cervical region     Plan: Findings:  Chronic worsening left-sided neck pain with pain postauricular type pain that does not seem to shoot over the top of the head and is not consistent with occipital neuralgia but we cannot rule that out.  I think a lot of her pain is myofascial pain.  She does have generalized anxiety with trigger points on palpation today that do reproduce some of her pain.  Trigger point injection was completed today diagnostically and hopefully therapeutically.  This was in the trapezius and levator  scapula and paraspinal region.  She tolerated that quite well with no difficulties.  We did use a small amount of Kenalog.  She does have significant facet arthropathy really at every level with small listhesis.  I think the next approach depending on how much relief she gets with trigger point injection may be focused physical therapy short course with potential for possible facet joint block.  We would need to make a decision at that point whether we look at cervical spine MRI and given her  age it might be wise to go ahead and do that one time first.  She can continue on the meloxicam as long as Dr. Juleen China is monitoring that in terms of her cardiac history.  We talked about active rest and things to modify during the day in terms of her neck such as getting back past 90 degrees at times like the muscles relax.  We have talked about some ways to help reduce stress and mindfulness type activities to try to get the trigger points to calm down.  We talked about manually working on this trigger points with pressure.  We will see her back in a few weeks.  We will see how the trigger point injection behaved.  Will make decisions at that point regarding the neck pain.  I do not think this is anything sinister obviously cannot rule out other issues that could cause ear pain such as sinus issues and other head neck problems.    Meds & Orders: No orders of the defined types were placed in this encounter.   Orders Placed This Encounter  Procedures  . Hand/Upper Extremity Inj  . Trigger Point Inj    Follow-up: Return in about 2 weeks (around 03/15/2018) for Recheck spine.   Procedures: Trigger Point Inj Date/Time: 03/11/2018 6:41 AM Performed by: Magnus Sinning, MD Authorized by: Magnus Sinning, MD   Consent Given by:  Patient Site marked: the procedure site was marked   Timeout: prior to procedure the correct patient, procedure, and site was verified   Total # of Trigger Points:  3 or more Location: neck and back   Needle Size:  25 G Approach:  Dorsal Medications #1:  20 mg triamcinolone acetonide 40 MG/ML Medications #2:  3 mL lidocaine 1 % Additional Injections?: No   Patient tolerance:  Patient tolerated the procedure well with no immediate complications    No notes on file   Clinical History: CERVICAL SPINE - COMPLETE 4+ VIEW  COMPARISON:  None.  FINDINGS: There is slight anterolisthesis of C4 on C5 and of C7 on T1 due to facet arthritis at each level. There is  marked disc space narrowing at C5-6 and C6-7.  Prevertebral soft tissues are normal.  No severe foraminal stenosis.  Severe bilateral facet arthritis at C4-5 and C7-T1. Moderately severe facet arthritis at C2-3 on the right. Lesser degrees of facet arthritis throughout the remainder of the cervical spine.  Moderate degenerative changes between the odontoid in the anterior arch of C1.  IMPRESSION: 1. No acute abnormality. 2. Degenerative disc disease primarily at C5-6 and C6-7. 3. Severe bilateral facet arthritis at C4-5 and C7-T1 and to a lesser degree at C2-3 on the right.   Electronically Signed   By: Lorriane Shire M.D.   On: 01/26/2018 15:33   She reports that she has never smoked. She has never used smokeless tobacco. No results for input(s): HGBA1C, LABURIC in the last 8760 hours.  Objective:  VS:  HT:5\' 7"  (170.2  cm)   WT:119 lb 6.4 oz (54.2 kg)  BMI:18.7    BP:(!) 141/57  HR:(!) 42bpm  TEMP:(!) 97.5 F (36.4 C)(Oral)  RESP:97 % Physical Exam  Ortho Exam Imaging: No results found.  Past Medical/Family/Surgical/Social History: Medications & Allergies reviewed per EMR, new medications updated. Patient Active Problem List   Diagnosis Date Noted  . Cervical spondylosis without myelopathy 03/11/2018  . Myofascial pain syndrome 03/11/2018  . Cervicalgia 03/11/2018  . Left ear pain 03/11/2018  . Insomnia 01/26/2018  . High risk medication use 01/26/2018  . Macular degeneration of both eyes 01/26/2018  . S/P right THA, AA 02/23/2017  . Hypothyroidism 09/07/2014  . A-fib [facility]), s/p catheter ablation of SVT in 2002 07/06/2014  . History of fracture of left hip 07/06/2014  . Benign essential HTN 07/06/2014  . GERD (gastroesophageal reflux disease) 07/06/2014  . Anxiety disorder 07/06/2014  . History of CVA (cerebrovascular accident), without residual effects 07/06/2014  . Iron deficiency anemia, unspecified 01/02/2013  . Constipation, intermittent  08/04/2007  . Depression 01/11/2007  . Allergic rhinitis 01/11/2007  . Osteoarthritis 01/11/2007  . Osteoporosis 01/11/2007   Past Medical History:  Diagnosis Date  . A-fib [facility]), s/p catheter ablation of SVT in 2002 07/06/2014  . Anxiety disorder 07/06/2014  . Atrial fibrillation ([place])    catheter ablation of SVT in 2002  . Benign essential HTN 07/06/2014  . Constipation, intermittent 08/04/2007  . CVA (cerebrovascular accident) ([place]) 2003   left brain  . Depression   . GERD (gastroesophageal reflux disease)   . History of CVA (cerebrovascular accident), without residual effects 07/06/2014  . Hypothyroidism 09/07/2014  . Iron deficiency anemia   . Iron deficiency anemia, unspecified 01/02/2013  . Osteoarthritis   . Osteoporosis   . Osteoporosis 01/11/2007  . Right BBB/left ant fasc block   . S/P right THA, AA 02/23/2017   History reviewed. No pertinent family history. Past Surgical History:  Procedure Laterality Date  . ABDOMINAL HYSTERECTOMY    . CHOLECYSTECTOMY    . COLONOSCOPY    . HIP PINNING,CANNULATED Left 07/07/2014   Procedure: CANNULATED HIP PINNING;  Surgeon: Mauri Pole, MD;  Location: [facility];  Service: Orthopedics;  Laterality: Left;  . LAPAROSCOPIC OVARIAN CYSTECTOMY    . right eye surgery  2008   dr Zadie Rhine.  Vitrectomy and removal of tissue.   Marland Kitchen TOTAL ABDOMINAL HYSTERECTOMY    . TOTAL HIP ARTHROPLASTY Right 02/23/2017   Procedure: RIGHT TOTAL HIP ARTHROPLASTY ANTERIOR APPROACH;  Surgeon: Paralee Cancel, MD;  Location: [facility];  Service: Orthopedics;  Laterality: Right;   Social History   Occupational History  . Not on file  Tobacco Use  . Smoking status: Never Smoker  . Smokeless tobacco: Never Used  Substance and Sexual Activity  . Alcohol use: Yes    Comment: social ; seldom   . Drug use: No  . Sexual activity: Never

## 2018-03-15 ENCOUNTER — Encounter: Payer: Self-pay | Admitting: Cardiology

## 2018-03-15 ENCOUNTER — Ambulatory Visit (INDEPENDENT_AMBULATORY_CARE_PROVIDER_SITE_OTHER): Payer: Medicare HMO

## 2018-03-15 ENCOUNTER — Ambulatory Visit: Payer: Medicare HMO | Admitting: Cardiology

## 2018-03-15 VITALS — BP 146/66 | HR 102 | Ht 67.0 in | Wt 116.6 lb

## 2018-03-15 DIAGNOSIS — I482 Chronic atrial fibrillation: Secondary | ICD-10-CM | POA: Diagnosis not present

## 2018-03-15 DIAGNOSIS — I495 Sick sinus syndrome: Secondary | ICD-10-CM

## 2018-03-15 DIAGNOSIS — I4821 Permanent atrial fibrillation: Secondary | ICD-10-CM

## 2018-03-15 MED ORDER — HYDROCHLOROTHIAZIDE 12.5 MG PO CAPS: 12.5000 mg | ORAL_CAPSULE | Freq: Every day | ORAL | 3 refills | Status: DC

## 2018-03-15 NOTE — Progress Notes (Signed)
Cardiology Office Note:    Date:  03/15/2018   ID:  Megan Olsen, DOB 06/18/1928, MRN [id number]  PCP:  Briscoe Deutscher, DO  Cardiologist:  No primary care provider on file.   Referring MD: Briscoe Deutscher, DO    History of Present Illness:    Megan Olsen is a 84 y.o. female here for evaluation of atrial fibrillation at the request of Dr. Juleen China.  She is listed as having chronic atrial fibrillation, bradycardia on no medications.  Aspirin was added at time of visit on 01/26/2018 and she was sent to [place] for further evaluation.  Echocardiogram done back in 2012 showed normal ejection fraction with moderate eccentric mitral regurgitation.  SVT ablation in 2002 and past medical history.Dr. Lovena Le. For years has felt SOB with heart racing, rare. Used to be on coumadin for years but she asked to come off. Been years.   Had CVA years ago.   Hip replacement year ago.   Today she is not complaining of any chest pain.  She may have some minimal shortness of breath with activity.  She seems to be quite active.  No fevers chills nausea vomiting syncope bleeding.  Past Medical History:  Diagnosis Date  . A-fib [facility]), s/p catheter ablation of SVT in 2002 07/06/2014  . Anxiety disorder 07/06/2014  . Atrial fibrillation ([place])    catheter ablation of SVT in 2002  . Benign essential HTN 07/06/2014  . Constipation, intermittent 08/04/2007  . CVA (cerebrovascular accident) ([place]) 2003   left brain  . Depression   . GERD (gastroesophageal reflux disease)   . History of CVA (cerebrovascular accident), without residual effects 07/06/2014  . Hypothyroidism 09/07/2014  . Iron deficiency anemia   . Iron deficiency anemia, unspecified 01/02/2013  . Osteoarthritis   . Osteoporosis   . Osteoporosis 01/11/2007  . Right BBB/left ant fasc block   . S/P right THA, AA 02/23/2017    Past Surgical History:  Procedure Laterality Date  . ABDOMINAL HYSTERECTOMY    . CHOLECYSTECTOMY    . COLONOSCOPY    .  HIP PINNING,CANNULATED Left 07/07/2014   Procedure: CANNULATED HIP PINNING;  Surgeon: Mauri Pole, MD;  Location: [facility];  Service: Orthopedics;  Laterality: Left;  . LAPAROSCOPIC OVARIAN CYSTECTOMY    . right eye surgery  2008   dr Zadie Rhine.  Vitrectomy and removal of tissue.   Marland Kitchen TOTAL ABDOMINAL HYSTERECTOMY    . TOTAL HIP ARTHROPLASTY Right 02/23/2017   Procedure: RIGHT TOTAL HIP ARTHROPLASTY ANTERIOR APPROACH;  Surgeon: Paralee Cancel, MD;  Location: [facility];  Service: Orthopedics;  Laterality: Right;    Current Medications: Current Meds  Medication Sig  . aspirin EC 81 MG tablet Take 1 tablet (81 mg total) by mouth daily.  . cholecalciferol (VITAMIN D) 1000 units tablet Take 1 tablet (1,000 Units total) by mouth daily with breakfast.  . dexlansoprazole (DEXILANT) 60 MG capsule Take 1 capsule (60 mg total) by mouth daily.  Marland Kitchen LORazepam (ATIVAN) 1 MG tablet Take 1 mg by mouth at bedtime.  . meloxicam (MOBIC) 15 MG tablet Take 15 mg by mouth daily.  . Multiple Vitamins-Minerals (PRESERVISION AREDS 2) CAPS Take 1 capsule by mouth 2 (two) times daily.  . [DISCONTINUED] bisoprolol-hydrochlorothiazide (ZIAC) 5-6.25 MG tablet Take 1 tablet by mouth daily.     Allergies:   Propoxyphene n-acetaminophen; Celecoxib; Fexofenadine; and Penicillins   Social History   Socioeconomic History  . Marital status: Single    Spouse name: Not on file  .  Number of children: Not on file  . Years of education: Not on file  . Highest education level: Not on file  Occupational History  . Not on file  Social Needs  . Financial resource strain: Not on file  . Food insecurity:    Worry: Not on file    Inability: Not on file  . Transportation needs:    Medical: Not on file    Non-medical: Not on file  Tobacco Use  . Smoking status: Never Smoker  . Smokeless tobacco: Never Used  Substance and Sexual Activity  . Alcohol use: Yes    Comment: social ; seldom   . Drug use: No  . Sexual activity: Never    Lifestyle  . Physical activity:    Days per week: Not on file    Minutes per session: Not on file  . Stress: Not on file  Relationships  . Social connections:    Talks on phone: Not on file    Gets together: Not on file    Attends religious service: Not on file    Active member of club or organization: Not on file    Attends meetings of clubs or organizations: Not on file    Relationship status: Not on file  Other Topics Concern  . Not on file  Social History Narrative  . Not on file     Family History: The patient has no early family history.   ROS:   Please see the history of present illness.     All other systems reviewed and are negative.  EKGs/Labs/Other Studies Reviewed:    The following studies were reviewed today: Prior office notes, labs, reviewed.   EKG:  EKG is  ordered today.  The ekg ordered today demonstrates 03/15/18 - AFIB 67. RBBB.   Recent Labs: 01/26/2018: ALT 11; BUN 22; Creatinine, Ser 0.89; Hemoglobin 10.5; Platelets 188.0; Potassium 4.7; Sodium 139; TSH 4.83  Recent Lipid Panel    Component Value Date/Time   CHOL 161 08/28/2015 1043   TRIG 82.0 08/28/2015 1043   HDL 70.40 08/28/2015 1043   CHOLHDL 2 08/28/2015 1043   VLDL 16.4 08/28/2015 1043   LDLCALC 74 08/28/2015 1043    Physical Exam:    VS:  BP (!) 146/66   Pulse (!) 102   Ht 5\' 7"  (1.702 m)   Wt 116 lb 9.6 oz (52.9 kg)   SpO2 (!) 76%   BMI 18.26 kg/m     Wt Readings from Last 3 Encounters:  03/15/18 116 lb 9.6 oz (52.9 kg)  03/01/18 119 lb 6.4 oz (54.2 kg)  01/26/18 120 lb 3.2 oz (54.5 kg)     GEN: Thin, in no acute distress HEENT: Normal NECK: No JVD; No carotid bruits LYMPHATICS: No lymphadenopathy CARDIAC: Irreg, no murmurs, rubs, gallops RESPIRATORY:  Clear to auscultation without rales, wheezing or rhonchi  ABDOMEN: Soft, non-tender, non-distended MUSCULOSKELETAL:  No edema; No deformity  SKIN: Warm and dry NEUROLOGIC:  Alert and oriented x 3 PSYCHIATRIC:   Normal affect   ASSESSMENT:    1. Permanent atrial fibrillation ([place])   2. Tachycardia-bradycardia syndrome ([place])    PLAN:    In order of problems listed above:  Permanent atrial fibrillation -EKG today shows atrial lesion with slow ventricular response heart rate 44 bpm with right bundle branch blocklike pattern underlying.  I explained to her the importance of anticoagulation.  She used to be on Coumadin in the past.  I mentioned to her Eliquis 2.5 mg  twice a day and she is fairly adamant that she does not want to go on this medication, worried about cost, worried about other potential issues.  We went back and forth for several minutes on this importance but ultimately she was not going to take the medication.  She understands the risk of stroke.  Chads vas score is at least 5.  Age, prior stroke, female.  - With bradycardic response, I will place a 14-day long-term monitor, patch Zio to quantify.  If she is having significant bradycardia, we will have her speak with Dr. Lovena Le about pacemaker placement.  Bradycardia -I will stop her Ziac which has bisoprolol in it, beta-blocker.  We will replace this with HCTZ 12.5 mg once a day.  But we checked an EKG today, her heart rate was in the 40s.  We will let her know the results of the monitor.  We will have her come back in in 6 months to see Cecille Rubin, 12 months to see me.   Medication Adjustments/Labs and Tests Ordered: Current medicines are reviewed at length with the patient today.  Concerns regarding medicines are outlined above.  Orders Placed This Encounter  Procedures  . CARDIAC EVENT MONITOR  . LONG TERM MONITOR (3-14 DAYS)  . EKG 12-Lead   Meds ordered this encounter  Medications  . hydrochlorothiazide (MICROZIDE) 12.5 MG capsule    Sig: Take 1 capsule (12.5 mg total) by mouth daily.    Dispense:  90 capsule    Refill:  3    Patient Instructions  Medication Instructions:  Please discontinue your Bisoprolol. Start  Hydrochlorothiazide 12.5 mg a day. Continue all other medications as listed.  Testing/Procedures: Your physician has recommended that you wear an event monitor for 14 days. Event monitors are medical devices that record the heart's electrical activity. Doctors most often [place] these monitors to diagnose arrhythmias. Arrhythmias are problems with the speed or rhythm of the heartbeat. The monitor is a small, portable device. You can wear one while you do your normal daily activities. This is usually used to diagnose what is causing palpitations/syncope (passing out).  Follow-Up: Follow up in 6 months with Truitt Merle, NP.  You will receive a letter in the mail 2 months before you are due.  Please call us when you receive this letter to schedule your follow up appointment.  Follow up in 1 year with Dr. Marlou Porch.  You will receive a letter in the mail 2 months before you are due.  Please call us when you receive this letter to schedule your follow up appointment.  If you need a refill on your cardiac medications before your next appointment, please call your pharmacy.  Thank you for choosing [facility]!!        Signed, Candee Furbish, MD  03/15/2018 5:55 PM    [place]

## 2018-03-15 NOTE — Patient Instructions (Signed)
Medication Instructions:  Please discontinue your Bisoprolol. Start Hydrochlorothiazide 12.5 mg a day. Continue all other medications as listed.  Testing/Procedures: Your physician has recommended that you wear an event monitor for 14 days. Event monitors are medical devices that record the heart's electrical activity. Doctors most often [place] these monitors to diagnose arrhythmias. Arrhythmias are problems with the speed or rhythm of the heartbeat. The monitor is a small, portable device. You can wear one while you do your normal daily activities. This is usually used to diagnose what is causing palpitations/syncope (passing out).  Follow-Up: Follow up in 6 months with Truitt Merle, NP.  You will receive a letter in the mail 2 months before you are due.  Please call us when you receive this letter to schedule your follow up appointment.  Follow up in 1 year with Dr. Marlou Porch.  You will receive a letter in the mail 2 months before you are due.  Please call us when you receive this letter to schedule your follow up appointment.  If you need a refill on your cardiac medications before your next appointment, please call your pharmacy.  Thank you for choosing [facility]!!

## 2018-03-25 DIAGNOSIS — I482 Chronic atrial fibrillation: Secondary | ICD-10-CM

## 2018-03-29 ENCOUNTER — Ambulatory Visit (INDEPENDENT_AMBULATORY_CARE_PROVIDER_SITE_OTHER): Payer: Medicare HMO | Admitting: Physical Medicine and Rehabilitation

## 2018-03-29 DIAGNOSIS — I482 Chronic atrial fibrillation: Secondary | ICD-10-CM | POA: Diagnosis not present

## 2018-03-30 ENCOUNTER — Telehealth: Payer: Self-pay | Admitting: Cardiology

## 2018-03-30 ENCOUNTER — Other Ambulatory Visit: Payer: Self-pay | Admitting: Internal Medicine

## 2018-03-30 NOTE — Telephone Encounter (Signed)
Spoke to [employer]), who said that this patient has been addressed by physician.

## 2018-03-30 NOTE — Telephone Encounter (Signed)
New Message    Raquel Sarna with [employer] calling with abnormal cardiac readings.

## 2018-04-06 ENCOUNTER — Ambulatory Visit: Payer: Medicare HMO | Admitting: Internal Medicine

## 2018-04-06 ENCOUNTER — Encounter: Payer: Self-pay | Admitting: Internal Medicine

## 2018-04-06 VITALS — BP 160/72 | HR 73 | Ht 67.0 in | Wt 116.2 lb

## 2018-04-06 DIAGNOSIS — I495 Sick sinus syndrome: Secondary | ICD-10-CM | POA: Diagnosis not present

## 2018-04-06 DIAGNOSIS — I482 Chronic atrial fibrillation: Secondary | ICD-10-CM

## 2018-04-06 DIAGNOSIS — I4821 Permanent atrial fibrillation: Secondary | ICD-10-CM

## 2018-04-06 MED ORDER — APIXABAN 2.5 MG PO TABS: 2.5000 mg | ORAL_TABLET | Freq: Two times a day (BID) | ORAL | 11 refills | Status: DC

## 2018-04-06 NOTE — H&P (View-Only) (Signed)
ELECTROPHYSIOLOGY CONSULT NOTE  Patient ID: Megan Olsen, MRN: [id number], DOB/AGE: February 16, 1928 84 y.o. Admit date: (Not on file) Date of Consult: 04/06/2018  Primary Physician: Briscoe Deutscher, DO Primary Cardiologist: MS     Megan Olsen is a 84 y.o. female who is being seen today for the evaluation of tach and bradycardia at the request of Dr. Marlou Porch.    HPI Megan Olsen is a 84 y.o. female referred because of tachycardia and bradycardia.  She has a history of atrial fibrillation detected is slow which prompted her PCP to have her see Dr. Marlou Porch.  Metoprolol was discontinued.  He undertook a ZIO Patch.  Results are outlined below.  She denies episodes of abrupt lightheadedness.  She does have exercise intolerance where she becomes unable to continue.  These are unassociated clear palpitations.  She does have tachypalpitations.  Denies edema.  Thromboembolic risk factors ( age -67, HTN-1, TIA/CVA-2, DM-1, Gender-1) for a CHADSVASc Score of >= did tuberculosis is 7   With discussions last month with Dr. Marlou Porch she is elected not to take anticoagulation Just a terrible thing    Past Medical History:  Diagnosis Date  . A-fib [facility]), s/p catheter ablation of SVT in 2002 07/06/2014  . Anxiety disorder 07/06/2014  . Atrial fibrillation ([place])    catheter ablation of SVT in 2002  . Benign essential HTN 07/06/2014  . Constipation, intermittent 08/04/2007  . CVA (cerebrovascular accident) ([place]) 2003   left brain  . Depression   . GERD (gastroesophageal reflux disease)   . History of CVA (cerebrovascular accident), without residual effects 07/06/2014  . Hypothyroidism 09/07/2014  . Iron deficiency anemia   . Iron deficiency anemia, unspecified 01/02/2013  . Osteoarthritis   . Osteoporosis   . Osteoporosis 01/11/2007  . Right BBB/left ant fasc block   . S/P right THA, AA 02/23/2017      Surgical History:  Past Surgical History:  Procedure Laterality Date  . ABDOMINAL  HYSTERECTOMY    . CHOLECYSTECTOMY    . COLONOSCOPY    . HIP PINNING,CANNULATED Left 07/07/2014   Procedure: CANNULATED HIP PINNING;  Surgeon: Mauri Pole, MD;  Location: [facility];  Service: Orthopedics;  Laterality: Left;  . LAPAROSCOPIC OVARIAN CYSTECTOMY    . right eye surgery  2008   dr Zadie Rhine.  Vitrectomy and removal of tissue.   Marland Kitchen TOTAL ABDOMINAL HYSTERECTOMY    . TOTAL HIP ARTHROPLASTY Right 02/23/2017   Procedure: RIGHT TOTAL HIP ARTHROPLASTY ANTERIOR APPROACH;  Surgeon: Paralee Cancel, MD;  [facility]: [facility];  Service: Orthopedics;  Laterality: Right;     Home Meds: Prior to Admission medications   Medication Sig Start Date End Date Taking? Authorizing Provider  aspirin EC 81 MG tablet Take 1 tablet (81 mg total) by mouth daily. 01/26/18  Yes Briscoe Deutscher, DO  cholecalciferol (VITAMIN D) 1000 units tablet Take 1 tablet (1,000 Units total) by mouth daily with breakfast. 04/15/17  Yes Marletta Lor, MD  dexlansoprazole (DEXILANT) 60 MG capsule Take 1 capsule (60 mg total) by mouth daily. 04/15/17  Yes Marletta Lor, MD  hydrochlorothiazide (MICROZIDE) 12.5 MG capsule Take 1 capsule (12.5 mg total) by mouth daily. 03/15/18 06/13/18 Yes Jerline Pain, MD  LORazepam (ATIVAN) 1 MG tablet Take 1 mg by mouth at bedtime.   Yes [provider]  meloxicam (MOBIC) 15 MG tablet TAKE 1 TABLET DAILY 03/31/18  Yes Marletta Lor, MD  Multiple Vitamins-Minerals (PRESERVISION AREDS 2) CAPS  Take 1 capsule by mouth 2 (two) times daily. 04/15/17  Yes Marletta Lor, MD    Allergies:  Allergies  Allergen Reactions  . Propoxyphene N-Acetaminophen Nausea Only  . Celecoxib Itching and Rash  . Fexofenadine Rash  . Penicillins Hives and Rash    Has patient had a PCN reaction causing immediate rash, facial/tongue/throat swelling, SOB or lightheadedness with hypotension: Yes Has patient had a PCN reaction causing severe rash involving mucus membranes or skin necrosis: No Has  patient had a PCN reaction that required hospitalization: No Has patient had a PCN reaction occurring within the last 10 years: No If all of the above answers are "NO", then may proceed with Cephalosporin use.     Social History   Socioeconomic History  . Marital status: Single    Spouse name: Not on file  . Number of children: Not on file  . Years of education: Not on file  . Highest education level: Not on file  Occupational History  . Not on file  Social Needs  . Financial resource strain: Not on file  . Food insecurity:    Worry: Not on file    Inability: Not on file  . Transportation needs:    Medical: Not on file    Non-medical: Not on file  Tobacco Use  . Smoking status: Never Smoker  . Smokeless tobacco: Never Used  Substance and Sexual Activity  . Alcohol use: Yes    Comment: social ; seldom   . Drug use: No  . Sexual activity: Never  Lifestyle  . Physical activity:    Days per week: Not on file    Minutes per session: Not on file  . Stress: Not on file  Relationships  . Social connections:    Talks on phone: Not on file    Gets together: Not on file    Attends religious service: Not on file    Active member of club or organization: Not on file    Attends meetings of clubs or organizations: Not on file    Relationship status: Not on file  . Intimate partner violence:    Fear of current or ex partner: Not on file    Emotionally abused: Not on file    Physically abused: Not on file    Forced sexual activity: Not on file  Other Topics Concern  . Not on file  Social History Narrative  . Not on file     History reviewed. No pertinent family history.   ROS:  Please see the history of present illness.     All other systems reviewed and negative.    Physical Exam: Blood pressure (!) 160/72, pulse 73, height 5\' 7"  (1.702 m), weight 116 lb 3.2 oz (52.7 kg), SpO2 96 %. General: Well developed, well nourished female in no acute distress. Head:  Normocephalic, atraumatic, sclera non-icteric, no xanthomas, nares are without discharge. EENT: normal  Lymph Nodes:  none Neck: Negative for carotid bruits. JVD not elevated. Back:without scoliosis kyphosis Lungs: Clear bilaterally to auscultation without wheezes, rales, or rhonchi. Breathing is unlabored. Heart: Irregular rate and rhythm with a 2/6 systolic murmur . No rubs, or gallops appreciated. Abdomen: Soft, non-tender, non-distended with normoactive bowel sounds. No hepatomegaly. No rebound/guarding. No obvious abdominal masses. Msk:  Strength and tone appear normal for age. Extremities: No clubbing or cyanosis. No  edema.  Distal pedal pulses are 2+ and equal bilaterally. Skin: Warm and Dry Neuro: Alert and oriented X 3. CN III-XII  intact Grossly normal sensory and motor function . Psych:  Responds to questions appropriately with a normal affect.      Labs: Cardiac Enzymes No results for input(s): CKTOTAL, CKMB, TROPONINI in the last 72 hours. CBC Lab Results  Component Value Date   WBC 5.8 01/26/2018   HGB 10.5 (L) 01/26/2018   HCT 33.0 (L) 01/26/2018   MCV 79.4 01/26/2018   PLT 188.0 01/26/2018   PROTIME: No results for input(s): LABPROT, INR in the last 72 hours. Chemistry No results for input(s): NA, K, CL, CO2, BUN, CREATININE, CALCIUM, PROT, BILITOT, ALKPHOS, ALT, AST, GLUCOSE in the last 168 hours.  Invalid input(s): LABALBU Lipids Lab Results  Component Value Date   CHOL 161 08/28/2015   HDL 70.40 08/28/2015   LDLCALC 74 08/28/2015   TRIG 82.0 08/28/2015   BNP Pro B Natriuretic peptide (BNP)  Date/Time Value Ref Range Status  10/01/2010 02:27 PM 210.6 (H) 0.0 - 100.0 pg/mL Final   Thyroid Function Tests: No results for input(s): TSH, T4TOTAL, T3FREE, THYROIDAB in the last 72 hours.  Invalid input(s): FREET3 Miscellaneous No results found for: DDIMER  Radiology/Studies:  No results found.  EKG:  Atrial fibrillation at  73 Intervals-/13/43 Ventricular ectopy-frequent  ZIO Patch demonstrates nocturnal bradycardia but exertional heart rates with averages up to 130 bpm for hours of activity.  She has peaks over 200.   Assessment and Plan:   Atrial fibrillation-permanent  Bradycardia  Tachycardia and exercise intolerance  Chads vas score greater than or equal to 7    The patient has been reluctant in the past to undertake anticoagulation.  She is agreeable today.  We will discontinue her aspirin.  We will start her on Eliquis 2.5 mg twice daily.  Her atrial fibrillation going fast as I think the cause of her exercise intolerance and her sympttoms  Her bradycardia seems to be nocturnal and not symptomatic.  However, I think to treat the tachycardia will drive her bradycardia to a point where in pacing will be necessary reluctant to embark on rate control without back-up bradycardia pacing.  She is reluctant to do this at this time.  She would like to think about it and come back in a month.  I think given that her symptoms are likely related to tachycardia not bradycardia she remains at risk for symptoms but hopefully not at risk for falls.     [name]

## 2018-04-06 NOTE — Patient Instructions (Addendum)
Medication Instructions:  Your physician has recommended you make the following change in your medication:   1. Stop Aspirin 2. Stop Meloxicam and begin using Tylenol for pain 2. Begin Eliquis, 2.5mg , one tablet, 2 times daily with food.  Labwork: None ordered.  Testing/Procedures: Your physician has recommended that you have a pacemaker inserted. A pacemaker is a small device that is placed under the skin of your chest or abdomen to help control abnormal heart rhythms. This device uses electrical pulses to prompt the heart to beat at a normal rate. Pacemakers are used to treat heart rhythms that are too slow. Wire (leads) are attached to the pacemaker that goes into the chambers of you heart. This is done in the hospital and usually requires and overnight stay. Please see the instruction sheet given to you today for more information.   Follow-Up: Your physician recommends that you schedule a follow-up appointment in:   One Month with Megan Marshall, NP to discuss pacemaker implantation.  Any Other Special Instructions Will Be Listed Below (If Applicable).   Apixaban oral tablets What is this medicine? APIXABAN (a PIX a ban) is an anticoagulant (blood thinner). It is used to lower the chance of stroke in people with a medical condition called atrial fibrillation. It is also used to treat or prevent blood clots in the lungs or in the veins. This medicine may be used for other purposes; ask your health care provider or pharmacist if you have questions. COMMON BRAND NAME(S): Eliquis What should I tell my health care provider before I take this medicine? They need to know if you have any of these conditions: -bleeding disorders -bleeding in the brain -blood in your stools (black or tarry stools) or if you have blood in your vomit -history of stomach bleeding -kidney disease -liver disease -mechanical heart valve -an unusual or allergic reaction to apixaban, other medicines, foods, dyes,  or preservatives -pregnant or trying to get pregnant -breast-feeding How should I use this medicine? Take this medicine by mouth with a glass of water. Follow the directions on the prescription label. You can take it with or without food. If it upsets your stomach, take it with food. Take your medicine at regular intervals. Do not take it more often than directed. Do not stop taking except on your doctor's advice. Stopping this medicine may increase your risk of a blot clot. Be sure to refill your prescription before you run out of medicine. Talk to your pediatrician regarding the use of this medicine in children. Special care may be needed. Overdosage: If you think you have taken too much of this medicine contact a poison control center or emergency room at once. NOTE: This medicine is only for you. Do not share this medicine with others. What if I miss a dose? If you miss a dose, take it as soon as you can. If it is almost time for your next dose, take only that dose. Do not take double or extra doses. What may interact with this medicine? This medicine may interact with the following: -aspirin and aspirin-like medicines -certain medicines for fungal infections like ketoconazole and itraconazole -certain medicines for seizures like carbamazepine and phenytoin -certain medicines that treat or prevent blood clots like warfarin, enoxaparin, and dalteparin -clarithromycin -NSAIDs, medicines for pain and inflammation, like ibuprofen or naproxen -rifampin -ritonavir -St. John's wort This list may not describe all possible interactions. Give your health care provider a list of all the medicines, herbs, non-prescription drugs, or dietary supplements  you use. Also tell them if you smoke, drink alcohol, or use illegal drugs. Some items may interact with your medicine. What should I watch for while using this medicine? Visit your doctor or health care professional for regular checks on your  progress. Notify your doctor or health care professional and seek emergency treatment if you develop breathing problems; changes in vision; chest pain; severe, sudden headache; pain, swelling, warmth in the leg; trouble speaking; sudden numbness or weakness of the face, arm or leg. These can be signs that your condition has gotten worse. If you are going to have surgery or other procedure, tell your doctor that you are taking this medicine. What side effects may I notice from receiving this medicine? Side effects that you should report to your doctor or health care professional as soon as possible: -allergic reactions like skin rash, itching or hives, swelling of the face, lips, or tongue -signs and symptoms of bleeding such as bloody or black, tarry stools; red or dark-brown urine; spitting up blood or brown material that looks like coffee grounds; red spots on the skin; unusual bruising or bleeding from the eye, gums, or nose This list may not describe all possible side effects. Call your doctor for medical advice about side effects. You may report side effects to FDA at 1-800-FDA-1088. Where should I keep my medicine? Keep out of the reach of children. Store at room temperature between 20 and 25 degrees C (68 and 77 degrees F). Throw away any unused medicine after the expiration date. NOTE: This sheet is a summary. It may not cover all possible information. If you have questions about this medicine, talk to your doctor, pharmacist, or health care provider.  2018 Elsevier/Gold Standard (2016-01-27 11:54:23)   Pacemaker Implantation A pacemaker implantation is a procedure to place (implant) a pacemaker into both of the lower chambers (ventricles) of the heart. A pacemaker is a small, battery-powered device that helps control the heartbeat. If the heart beats irregularly or too slowly (bradycardia), the pacemaker will pace the heart so that it beats at a normal rate or a programmed rate. The parts  of a biventricular pacemaker include:  The pulse generator. The pulse generator contains a small computer and a memory system that is programmed to keep the heart beating at a certain rate. The pulse generator also produces the electrical signal that triggers the heart to beat. This is implanted under the skin of the upper chest, near the collarbone.  Wires (leads). The leads are placed in the right ventricle of the heart. The leads are connected to the pulse generator. They transmit electrical pulses from the pulse generator to the heart.  This procedure may be done to treat:  Bradycardia.  Symptoms of severe heart failure, such as shortness of breath (dyspnea).  Loss of consciousness that happens repeatedly (syncope) because of an irregular heart rate.  Tell a health care provider about:  Any allergies you have.  All medicines you are taking, including vitamins, herbs, eye drops, creams, and over-the-counter medicines.  Any problems you or family members have had with anesthetic medicines.  Any blood disorders you have.  Any surgeries you have had.  Any medical conditions you have.  Whether you are pregnant or may be pregnant. What are the risks? Generally, this is a safe procedure. However, problems may occur, including:  Infection.  Bleeding.  Allergic reactions to medicines or dyes.  Damage to other structures or organs, such as your blood vessels, lungs, or heart.  Failure of the pacemaker to improve your condition.  What happens before the procedure?  Ask your health care provider about: ? Changing or stopping your regular medicines. This is especially important if you are taking diabetes medicines or blood thinners. ? Taking medicines such as aspirin and ibuprofen. These medicines can thin your blood. Do not take these medicines before your procedure if your health care provider instructs you not to.  Follow instructions from your health care provider about  eating or drinking restrictions.  Do not use any tobacco products for at least 24 hours before your procedure. This includes cigarettes, chewing tobacco, or e-cigarettes.  Ask your health care provider how your surgical site will be marked or identified.  You may be given antibiotic medicine to help prevent infection.  You may have tests, including: ? Blood tests. ? Chest X-rays.  Plan to have someone take you home after the procedure.  If you go home right after the procedure, plan to have someone with you for 24 hours. What happens during the procedure?  To reduce your risk of infection: ? Your health care team will wash or sanitize their hands. ? Your skin will be washed with soap. ? Hair may be removed from your surgical area.  An IV tube will be inserted into one of your veins.  You will be given one or more of the following: ? A medicine to help you relax (sedative). ? A medicine to make you fall asleep (general anesthetic). ? A medicine that is injected into your spine to numb the area below and slightly above the injection site (spinal anesthetic). ? A medicine that is injected into an area of your body to numb everything below the injection site (regional anesthetic).  An incision will be made in your upper chest, near your heart.  The leads will be guided into your incision, through your blood vessels, and into your ventricles. Your surgeon will use an X-ray machine (fluoroscope) to guide the leads into your heart.  The leads will be attached to your heart muscles and to the pulse generator.  The leads will be tested to make sure that they work correctly.  The pulse generator will be implanted under your skin, near your incision.  Your incision will be closed with stitches (sutures), skin glue, or adhesive tape.  A bandage (dressing) will be placed over your incision. The procedure may vary among health care providers and hospitals. What happens after the  procedure?  Your blood pressure, heart rate, breathing rate, and blood oxygen level will be monitored often until the medicines you were given have worn off.  You may continue to receive fluids and medicines through an IV tube.  You will have some pain. Pain medicines will be available to help you.  You will have a chest X-ray done. This is to make sure that your pacemaker is in the right place.  You may have to wear compression stockings. These stockings help to prevent blood clots and reduce swelling in your legs.  You will be given a pacemaker identification card. This card lists the implant date, device model, and manufacturer of your pacemaker.  Do not drive for 24 hours if you received a sedative. This information is not intended to replace advice given to you by your health care provider. Make sure you discuss any questions you have with your health care provider. Document Released: 03/30/2012 Document Revised: 12/12/2015 Document Reviewed: 03/31/2015 Elsevier Interactive Patient Education  [employer].  If you need a refill on your cardiac medications before your next appointment, please call your pharmacy.

## 2018-04-06 NOTE — Progress Notes (Signed)
ELECTROPHYSIOLOGY CONSULT NOTE  Patient ID: Megan Olsen, MRN: [id number], DOB/AGE: 1928/06/07 84 y.o. Admit date: (Not on file) Date of Consult: 04/06/2018  Primary Physician: Briscoe Deutscher, DO Primary Cardiologist: MS     Megan Olsen is a 84 y.o. female who is being seen today for the evaluation of tach and bradycardia at the request of Dr. Marlou Porch.    HPI Megan Olsen is a 84 y.o. female referred because of tachycardia and bradycardia.  She has a history of atrial fibrillation detected is slow which prompted her PCP to have her see Dr. Marlou Porch.  Metoprolol was discontinued.  He undertook a ZIO Patch.  Results are outlined below.  She denies episodes of abrupt lightheadedness.  She does have exercise intolerance where she becomes unable to continue.  These are unassociated clear palpitations.  She does have tachypalpitations.  Denies edema.  Thromboembolic risk factors ( age -50, HTN-1, TIA/CVA-2, DM-1, Gender-1) for a CHADSVASc Score of >= did tuberculosis is 7   With discussions last month with Dr. Marlou Porch she is elected not to take anticoagulation Just a terrible thing    Past Medical History:  Diagnosis Date  . A-fib [facility]), s/p catheter ablation of SVT in 2002 07/06/2014  . Anxiety disorder 07/06/2014  . Atrial fibrillation ([place])    catheter ablation of SVT in 2002  . Benign essential HTN 07/06/2014  . Constipation, intermittent 08/04/2007  . CVA (cerebrovascular accident) ([place]) 2003   left brain  . Depression   . GERD (gastroesophageal reflux disease)   . History of CVA (cerebrovascular accident), without residual effects 07/06/2014  . Hypothyroidism 09/07/2014  . Iron deficiency anemia   . Iron deficiency anemia, unspecified 01/02/2013  . Osteoarthritis   . Osteoporosis   . Osteoporosis 01/11/2007  . Right BBB/left ant fasc block   . S/P right THA, AA 02/23/2017      Surgical History:  Past Surgical History:  Procedure Laterality Date  . ABDOMINAL  HYSTERECTOMY    . CHOLECYSTECTOMY    . COLONOSCOPY    . HIP PINNING,CANNULATED Left 07/07/2014   Procedure: CANNULATED HIP PINNING;  Surgeon: Mauri Pole, MD;  Location: [facility]S;  Service: Orthopedics;  Laterality: Left;  . LAPAROSCOPIC OVARIAN CYSTECTOMY    . right eye surgery  2008   dr Zadie Rhine.  Vitrectomy and removal of tissue.   Marland Kitchen TOTAL ABDOMINAL HYSTERECTOMY    . TOTAL HIP ARTHROPLASTY Right 02/23/2017   Procedure: RIGHT TOTAL HIP ARTHROPLASTY ANTERIOR APPROACH;  Surgeon: Paralee Cancel, MD;  Location: [facility];  Service: Orthopedics;  Laterality: Right;     Home Meds: Prior to Admission medications   Medication Sig Start Date End Date Taking? Authorizing Provider  aspirin EC 81 MG tablet Take 1 tablet (81 mg total) by mouth daily. 01/26/18  Yes Briscoe Deutscher, DO  cholecalciferol (VITAMIN D) 1000 units tablet Take 1 tablet (1,000 Units total) by mouth daily with breakfast. 04/15/17  Yes Marletta Lor, MD  dexlansoprazole (DEXILANT) 60 MG capsule Take 1 capsule (60 mg total) by mouth daily. 04/15/17  Yes Marletta Lor, MD  hydrochlorothiazide (MICROZIDE) 12.5 MG capsule Take 1 capsule (12.5 mg total) by mouth daily. 03/15/18 06/13/18 Yes Jerline Pain, MD  LORazepam (ATIVAN) 1 MG tablet Take 1 mg by mouth at bedtime.   Yes [provider]  meloxicam (MOBIC) 15 MG tablet TAKE 1 TABLET DAILY 03/31/18  Yes Marletta Lor, MD  Multiple Vitamins-Minerals (PRESERVISION AREDS 2) CAPS  Take 1 capsule by mouth 2 (two) times daily. 04/15/17  Yes Marletta Lor, MD    Allergies:  Allergies  Allergen Reactions  . Propoxyphene N-Acetaminophen Nausea Only  . Celecoxib Itching and Rash  . Fexofenadine Rash  . Penicillins Hives and Rash    Has patient had a PCN reaction causing immediate rash, facial/tongue/throat swelling, SOB or lightheadedness with hypotension: Yes Has patient had a PCN reaction causing severe rash involving mucus membranes or skin necrosis: No Has  patient had a PCN reaction that required hospitalization: No Has patient had a PCN reaction occurring within the last 10 years: No If all of the above answers are "NO", then may proceed with Cephalosporin use.     Social History   Socioeconomic History  . Marital status: Single    Spouse name: Not on file  . Number of children: Not on file  . Years of education: Not on file  . Highest education level: Not on file  Occupational History  . Not on file  Social Needs  . Financial resource strain: Not on file  . Food insecurity:    Worry: Not on file    Inability: Not on file  . Transportation needs:    Medical: Not on file    Non-medical: Not on file  Tobacco Use  . Smoking status: Never Smoker  . Smokeless tobacco: Never Used  Substance and Sexual Activity  . Alcohol use: Yes    Comment: social ; seldom   . Drug use: No  . Sexual activity: Never  Lifestyle  . Physical activity:    Days per week: Not on file    Minutes per session: Not on file  . Stress: Not on file  Relationships  . Social connections:    Talks on phone: Not on file    Gets together: Not on file    Attends religious service: Not on file    Active member of club or organization: Not on file    Attends meetings of clubs or organizations: Not on file    Relationship status: Not on file  . Intimate partner violence:    Fear of current or ex partner: Not on file    Emotionally abused: Not on file    Physically abused: Not on file    Forced sexual activity: Not on file  Other Topics Concern  . Not on file  Social History Narrative  . Not on file     History reviewed. No pertinent family history.   ROS:  Please see the history of present illness.     All other systems reviewed and negative.    Physical Exam: Blood pressure (!) 160/72, pulse 73, height 5\' 7"  (1.702 m), weight 116 lb 3.2 oz (52.7 kg), SpO2 96 %. General: Well developed, well nourished female in no acute distress. Head:  Normocephalic, atraumatic, sclera non-icteric, no xanthomas, nares are without discharge. EENT: normal  Lymph Nodes:  none Neck: Negative for carotid bruits. JVD not elevated. Back:without scoliosis kyphosis Lungs: Clear bilaterally to auscultation without wheezes, rales, or rhonchi. Breathing is unlabored. Heart: Irregular rate and rhythm with a 2/6 systolic murmur . No rubs, or gallops appreciated. Abdomen: Soft, non-tender, non-distended with normoactive bowel sounds. No hepatomegaly. No rebound/guarding. No obvious abdominal masses. Msk:  Strength and tone appear normal for age. Extremities: No clubbing or cyanosis. No  edema.  Distal pedal pulses are 2+ and equal bilaterally. Skin: Warm and Dry Neuro: Alert and oriented X 3. CN III-XII  intact Grossly normal sensory and motor function . Psych:  Responds to questions appropriately with a normal affect.      Labs: Cardiac Enzymes No results for input(s): CKTOTAL, CKMB, TROPONINI in the last 72 hours. CBC Lab Results  Component Value Date   WBC 5.8 01/26/2018   HGB 10.5 (L) 01/26/2018   HCT 33.0 (L) 01/26/2018   MCV 79.4 01/26/2018   PLT 188.0 01/26/2018   PROTIME: No results for input(s): LABPROT, INR in the last 72 hours. Chemistry No results for input(s): NA, K, CL, CO2, BUN, CREATININE, CALCIUM, PROT, BILITOT, ALKPHOS, ALT, AST, GLUCOSE in the last 168 hours.  Invalid input(s): LABALBU Lipids Lab Results  Component Value Date   CHOL 161 08/28/2015   HDL 70.40 08/28/2015   LDLCALC 74 08/28/2015   TRIG 82.0 08/28/2015   BNP Pro B Natriuretic peptide (BNP)  Date/Time Value Ref Range Status  10/01/2010 02:27 PM 210.6 (H) 0.0 - 100.0 pg/mL Final   Thyroid Function Tests: No results for input(s): TSH, T4TOTAL, T3FREE, THYROIDAB in the last 72 hours.  Invalid input(s): FREET3 Miscellaneous No results found for: DDIMER  Radiology/Studies:  No results found.  EKG:  Atrial fibrillation at  73 Intervals-/13/43 Ventricular ectopy-frequent  ZIO Patch demonstrates nocturnal bradycardia but exertional heart rates with averages up to 130 bpm for hours of activity.  She has peaks over 200.   Assessment and Plan:   Atrial fibrillation-permanent  Bradycardia  Tachycardia and exercise intolerance  Chads vas score greater than or equal to 7    The patient has been reluctant in the past to undertake anticoagulation.  She is agreeable today.  We will discontinue her aspirin.  We will start her on Eliquis 2.5 mg twice daily.  Her atrial fibrillation going fast as I think the cause of her exercise intolerance and her sympttoms  Her bradycardia seems to be nocturnal and not symptomatic.  However, I think to treat the tachycardia will drive her bradycardia to a point where in pacing will be necessary reluctant to embark on rate control without back-up bradycardia pacing.  She is reluctant to do this at this time.  She would like to think about it and come back in a month.  I think given that her symptoms are likely related to tachycardia not bradycardia she remains at risk for symptoms but hopefully not at risk for falls.     [name]

## 2018-04-11 ENCOUNTER — Telehealth: Payer: Self-pay | Admitting: Internal Medicine

## 2018-04-11 DIAGNOSIS — I4821 Permanent atrial fibrillation: Secondary | ICD-10-CM

## 2018-04-11 NOTE — Telephone Encounter (Signed)
New message:     Pt is calling and states she is ready to proceed with getting a pacemaker.

## 2018-04-12 ENCOUNTER — Other Ambulatory Visit: Payer: Self-pay | Admitting: Family Medicine

## 2018-04-12 NOTE — Telephone Encounter (Signed)
Please advise on refill.

## 2018-04-12 NOTE — Telephone Encounter (Signed)
Patient states that she is taking 1/2 in am and 1 at night.

## 2018-04-12 NOTE — Telephone Encounter (Signed)
Call patient to make sure dose is correct. It looks like she has had issues with getting this filled correctly in the past.

## 2018-04-12 NOTE — Telephone Encounter (Signed)
Per Dr Caryl Comes, pt is to hold her Eliquis the night before and the morning of her procedure. Pt has verbalized understanding and had no additional questions.

## 2018-04-12 NOTE — Telephone Encounter (Signed)
Pt agreeable to PPM implant on October 9 with Dr Caryl Comes. Pt understands I will be in contact with her regarding anticoagulation prior to her procedure. Instruction letter placed in the mail for pt to review. Letter with upcoming appointments were also sent to pt. Pt has verbalized understanding of pre procedure instructions and will call with any additional questions.

## 2018-04-19 ENCOUNTER — Encounter (INDEPENDENT_AMBULATORY_CARE_PROVIDER_SITE_OTHER): Payer: Self-pay

## 2018-04-19 ENCOUNTER — Other Ambulatory Visit: Payer: Medicare HMO

## 2018-04-19 DIAGNOSIS — I4821 Permanent atrial fibrillation: Secondary | ICD-10-CM | POA: Diagnosis not present

## 2018-04-19 LAB — CBC
HEMATOCRIT: 30 % — AB (ref 34.0–46.6)
Hemoglobin: 9.9 g/dL — ABNORMAL LOW (ref 11.1–15.9)
MCH: 26.7 pg (ref 26.6–33.0)
MCHC: 33 g/dL (ref 31.5–35.7)
MCV: 81 fL (ref 79–97)
PLATELETS: 213 10*3/uL (ref 150–450)
RBC: 3.71 x10E6/uL — ABNORMAL LOW (ref 3.77–5.28)
RDW: 16.3 % — AB (ref 12.3–15.4)
WBC: 4.8 10*3/uL (ref 3.4–10.8)

## 2018-04-19 LAB — BASIC METABOLIC PANEL
BUN / CREAT RATIO: 21 (ref 12–28)
BUN: 16 mg/dL (ref 8–27)
CO2: 24 mmol/L (ref 20–29)
CREATININE: 0.76 mg/dL (ref 0.57–1.00)
Calcium: 8.7 mg/dL (ref 8.7–10.3)
Chloride: 97 mmol/L (ref 96–106)
GFR calc Af Amer: 81 mL/min/{1.73_m2} (ref >59)
GFR calc non Af Amer: 70 mL/min/{1.73_m2} (ref >59)
GLUCOSE: 89 mg/dL (ref 65–99)
Potassium: 4.4 mmol/L (ref 3.5–5.2)
SODIUM: 137 mmol/L (ref 134–144)

## 2018-04-20 DIAGNOSIS — H353221 Exudative age-related macular degeneration, left eye, with active choroidal neovascularization: Secondary | ICD-10-CM | POA: Diagnosis not present

## 2018-04-20 DIAGNOSIS — H353211 Exudative age-related macular degeneration, right eye, with active choroidal neovascularization: Secondary | ICD-10-CM | POA: Diagnosis not present

## 2018-04-20 DIAGNOSIS — H353231 Exudative age-related macular degeneration, bilateral, with active choroidal neovascularization: Secondary | ICD-10-CM | POA: Diagnosis not present

## 2018-04-26 ENCOUNTER — Other Ambulatory Visit: Payer: Medicare HMO

## 2018-04-27 ENCOUNTER — Other Ambulatory Visit: Payer: Self-pay

## 2018-04-27 ENCOUNTER — Ambulatory Visit (HOSPITAL_COMMUNITY)
Admission: RE | Admit: 2018-04-27 | Discharge: 2018-04-28 | Disposition: A | Discharge status: Home / Self Care | Payer: Medicare HMO | Source: Ambulatory Visit | Attending: Internal Medicine | Admitting: Internal Medicine

## 2018-04-27 ENCOUNTER — Encounter (HOSPITAL_COMMUNITY): Payer: Self-pay | Admitting: General Practice

## 2018-04-27 ENCOUNTER — Encounter (HOSPITAL_COMMUNITY)
Admission: RE | Disposition: A | Discharge status: Home / Self Care | Payer: Self-pay | Source: Ambulatory Visit | Attending: Internal Medicine

## 2018-04-27 DIAGNOSIS — Z79899 Other long term (current) drug therapy: Secondary | ICD-10-CM | POA: Insufficient documentation

## 2018-04-27 DIAGNOSIS — E039 Hypothyroidism, unspecified: Secondary | ICD-10-CM | POA: Diagnosis not present

## 2018-04-27 DIAGNOSIS — F329 Major depressive disorder, single episode, unspecified: Secondary | ICD-10-CM | POA: Diagnosis not present

## 2018-04-27 DIAGNOSIS — F419 Anxiety disorder, unspecified: Secondary | ICD-10-CM | POA: Diagnosis not present

## 2018-04-27 DIAGNOSIS — K219 Gastro-esophageal reflux disease without esophagitis: Secondary | ICD-10-CM | POA: Diagnosis not present

## 2018-04-27 DIAGNOSIS — M810 Age-related osteoporosis without current pathological fracture: Secondary | ICD-10-CM | POA: Insufficient documentation

## 2018-04-27 DIAGNOSIS — I10 Essential (primary) hypertension: Secondary | ICD-10-CM | POA: Diagnosis not present

## 2018-04-27 DIAGNOSIS — Z23 Encounter for immunization: Secondary | ICD-10-CM | POA: Insufficient documentation

## 2018-04-27 DIAGNOSIS — Z950 Presence of cardiac pacemaker: Secondary | ICD-10-CM

## 2018-04-27 DIAGNOSIS — Z7982 Long term (current) use of aspirin: Secondary | ICD-10-CM | POA: Insufficient documentation

## 2018-04-27 DIAGNOSIS — Z959 Presence of cardiac and vascular implant and graft, unspecified: Secondary | ICD-10-CM

## 2018-04-27 DIAGNOSIS — R918 Other nonspecific abnormal finding of lung field: Secondary | ICD-10-CM | POA: Insufficient documentation

## 2018-04-27 DIAGNOSIS — M1990 Unspecified osteoarthritis, unspecified site: Secondary | ICD-10-CM | POA: Insufficient documentation

## 2018-04-27 DIAGNOSIS — Z90710 Acquired absence of both cervix and uterus: Secondary | ICD-10-CM | POA: Diagnosis not present

## 2018-04-27 DIAGNOSIS — Z9049 Acquired absence of other specified parts of digestive tract: Secondary | ICD-10-CM | POA: Insufficient documentation

## 2018-04-27 DIAGNOSIS — Z9889 Other specified postprocedural states: Secondary | ICD-10-CM | POA: Insufficient documentation

## 2018-04-27 DIAGNOSIS — E109 Type 1 diabetes mellitus without complications: Secondary | ICD-10-CM | POA: Insufficient documentation

## 2018-04-27 DIAGNOSIS — Z794 Long term (current) use of insulin: Secondary | ICD-10-CM | POA: Insufficient documentation

## 2018-04-27 DIAGNOSIS — I4891 Unspecified atrial fibrillation: Secondary | ICD-10-CM | POA: Diagnosis present

## 2018-04-27 DIAGNOSIS — I4510 Unspecified right bundle-branch block: Secondary | ICD-10-CM | POA: Insufficient documentation

## 2018-04-27 DIAGNOSIS — Z7901 Long term (current) use of anticoagulants: Secondary | ICD-10-CM | POA: Insufficient documentation

## 2018-04-27 DIAGNOSIS — I4821 Permanent atrial fibrillation: Secondary | ICD-10-CM | POA: Insufficient documentation

## 2018-04-27 DIAGNOSIS — Z96641 Presence of right artificial hip joint: Secondary | ICD-10-CM | POA: Diagnosis not present

## 2018-04-27 DIAGNOSIS — Z8673 Personal history of transient ischemic attack (TIA), and cerebral infarction without residual deficits: Secondary | ICD-10-CM | POA: Diagnosis not present

## 2018-04-27 DIAGNOSIS — I700 Atherosclerosis of aorta: Secondary | ICD-10-CM | POA: Insufficient documentation

## 2018-04-27 DIAGNOSIS — Z880 Allergy status to penicillin: Secondary | ICD-10-CM | POA: Insufficient documentation

## 2018-04-27 DIAGNOSIS — I495 Sick sinus syndrome: Secondary | ICD-10-CM | POA: Diagnosis not present

## 2018-04-27 HISTORY — DX: Presence of cardiac pacemaker: Z95.0

## 2018-04-27 HISTORY — DX: Cardiac murmur, unspecified: R01.1

## 2018-04-27 HISTORY — DX: Pneumonia, unspecified organism: J18.9

## 2018-04-27 HISTORY — DX: Unspecified osteoarthritis, unspecified site: M19.90

## 2018-04-27 HISTORY — PX: INSERT / REPLACE / REMOVE PACEMAKER: SUR710

## 2018-04-27 HISTORY — DX: Exudative age-related macular degeneration, bilateral, stage unspecified: H35.3230

## 2018-04-27 HISTORY — PX: PACEMAKER IMPLANT: EP1218

## 2018-04-27 LAB — SURGICAL PCR SCREEN
MRSA, PCR: NEGATIVE
Staphylococcus aureus: NEGATIVE

## 2018-04-27 SURGERY — PACEMAKER IMPLANT

## 2018-04-27 MED ORDER — SODIUM CHLORIDE 0.9 % IV SOLN
INTRAVENOUS | Status: AC
  Administered 2018-04-27: 18:00:00 via INTRAVENOUS

## 2018-04-27 MED ORDER — VANCOMYCIN HCL IN DEXTROSE 1-5 GM/200ML-% IV SOLN
1000.0000 mg | Freq: Two times a day (BID) | INTRAVENOUS | Status: AC
  Administered 2018-04-28: 1000 mg via INTRAVENOUS
  Filled 2018-04-27: qty 200

## 2018-04-27 MED ORDER — HEPARIN (PORCINE) IN NACL 1000-0.9 UT/500ML-% IV SOLN
INTRAVENOUS | Status: AC
  Filled 2018-04-27: qty 500

## 2018-04-27 MED ORDER — SODIUM CHLORIDE 0.9 % IV SOLN
INTRAVENOUS | Status: DC
  Administered 2018-04-27: 50 mL/h via INTRAVENOUS

## 2018-04-27 MED ORDER — LORAZEPAM 0.5 MG PO TABS
0.5000 mg | ORAL_TABLET | Freq: Every day | ORAL | Status: DC
  Administered 2018-04-28: 0.5 mg via ORAL
  Filled 2018-04-27: qty 1

## 2018-04-27 MED ORDER — ACETAMINOPHEN 325 MG PO TABS
325.0000 mg | ORAL_TABLET | ORAL | Status: DC | PRN
  Administered 2018-04-27: 650 mg via ORAL
  Filled 2018-04-27: qty 2

## 2018-04-27 MED ORDER — SODIUM CHLORIDE 0.9 % IV SOLN
80.0000 mg | INTRAVENOUS | Status: AC
  Administered 2018-04-27: 80 mg
  Filled 2018-04-27: qty 2

## 2018-04-27 MED ORDER — HEPARIN (PORCINE) IN NACL 1000-0.9 UT/500ML-% IV SOLN
INTRAVENOUS | Status: DC | PRN
  Administered 2018-04-27: 500 mL

## 2018-04-27 MED ORDER — LIDOCAINE HCL (PF) 1 % IJ SOLN
INTRAMUSCULAR | Status: AC
  Filled 2018-04-27: qty 60

## 2018-04-27 MED ORDER — INFLUENZA VAC SPLIT HIGH-DOSE 0.5 ML IM SUSY
0.5000 mL | PREFILLED_SYRINGE | INTRAMUSCULAR | Status: AC
  Administered 2018-04-28: 0.5 mL via INTRAMUSCULAR
  Filled 2018-04-27: qty 0.5

## 2018-04-27 MED ORDER — CARBOXYMETHYLCELLULOSE SODIUM 0.25 % OP SOLN: 1.0000 [drp] | Freq: Three times a day (TID) | OPHTHALMIC | Status: DC | PRN

## 2018-04-27 MED ORDER — YOU HAVE A PACEMAKER BOOK
Freq: Once | Status: AC
  Administered 2018-04-27: 23:00:00 1
  Filled 2018-04-27: qty 1

## 2018-04-27 MED ORDER — CHLORHEXIDINE GLUCONATE 4 % EX LIQD
60.0000 mL | Freq: Once | CUTANEOUS | Status: DC
  Filled 2018-04-27: qty 60

## 2018-04-27 MED ORDER — VITAMIN D3 25 MCG (1000 UNIT) PO TABS
2000.0000 [IU] | ORAL_TABLET | Freq: Every day | ORAL | Status: DC
  Administered 2018-04-28: 2000 [IU] via ORAL
  Filled 2018-04-27 (×2): qty 2

## 2018-04-27 MED ORDER — SODIUM CHLORIDE 0.9 % IV SOLN
INTRAVENOUS | Status: AC
  Filled 2018-04-27: qty 2

## 2018-04-27 MED ORDER — ONDANSETRON HCL 4 MG/2ML IJ SOLN: 4.0000 mg | Freq: Four times a day (QID) | INTRAMUSCULAR | Status: DC | PRN

## 2018-04-27 MED ORDER — OFF THE BEAT BOOK
Freq: Once | Status: AC
  Administered 2018-04-27: 23:00:00 1
  Filled 2018-04-27: qty 1

## 2018-04-27 MED ORDER — VANCOMYCIN HCL IN DEXTROSE 1-5 GM/200ML-% IV SOLN
1000.0000 mg | INTRAVENOUS | Status: AC
  Administered 2018-04-27: 1000 mg via INTRAVENOUS
  Filled 2018-04-27: qty 200

## 2018-04-27 MED ORDER — HYDRALAZINE HCL 20 MG/ML IJ SOLN
INTRAMUSCULAR | Status: AC
  Filled 2018-04-27: qty 1

## 2018-04-27 MED ORDER — MUPIROCIN 2 % EX OINT
TOPICAL_OINTMENT | CUTANEOUS | Status: AC
  Filled 2018-04-27: qty 22

## 2018-04-27 MED ORDER — LORAZEPAM 1 MG PO TABS
1.0000 mg | ORAL_TABLET | Freq: Every day | ORAL | Status: DC
  Administered 2018-04-27: 1 mg via ORAL
  Filled 2018-04-27: qty 1

## 2018-04-27 MED ORDER — MIDAZOLAM HCL 5 MG/5ML IJ SOLN
INTRAMUSCULAR | Status: AC
  Filled 2018-04-27: qty 5

## 2018-04-27 MED ORDER — DILTIAZEM HCL ER COATED BEADS 120 MG PO CP24
120.0000 mg | ORAL_CAPSULE | Freq: Every day | ORAL | Status: DC
  Administered 2018-04-27 – 2018-04-28 (×2): 120 mg via ORAL
  Filled 2018-04-27 (×2): qty 1

## 2018-04-27 MED ORDER — FENTANYL CITRATE (PF) 100 MCG/2ML IJ SOLN
INTRAMUSCULAR | Status: AC
  Filled 2018-04-27: qty 2

## 2018-04-27 MED ORDER — PROSIGHT PO TABS
1.0000 | ORAL_TABLET | Freq: Two times a day (BID) | ORAL | Status: DC
  Administered 2018-04-27 – 2018-04-28 (×2): 1 via ORAL
  Filled 2018-04-27 (×2): qty 1

## 2018-04-27 MED ORDER — HYDRALAZINE HCL 20 MG/ML IJ SOLN
INTRAMUSCULAR | Status: DC | PRN
  Administered 2018-04-27: 10 mg via INTRAVENOUS

## 2018-04-27 MED ORDER — PANTOPRAZOLE SODIUM 40 MG PO TBEC
40.0000 mg | DELAYED_RELEASE_TABLET | Freq: Every day | ORAL | Status: DC
  Administered 2018-04-28: 10:00:00 40 mg via ORAL
  Filled 2018-04-27: qty 1

## 2018-04-27 MED ORDER — SODIUM CHLORIDE 0.9 % IV SOLN
INTRAVENOUS | Status: DC | PRN
  Administered 2018-04-27: 500 mL

## 2018-04-27 MED ORDER — VANCOMYCIN HCL IN DEXTROSE 1-5 GM/200ML-% IV SOLN
INTRAVENOUS | Status: AC
  Filled 2018-04-27: qty 200

## 2018-04-27 MED ORDER — LIDOCAINE HCL (PF) 1 % IJ SOLN
INTRAMUSCULAR | Status: DC | PRN
  Administered 2018-04-27 (×2): 60 mL

## 2018-04-27 MED ORDER — MUPIROCIN 2 % EX OINT
1.0000 "application " | TOPICAL_OINTMENT | Freq: Once | CUTANEOUS | Status: AC
  Administered 2018-04-27: 1 via TOPICAL
  Filled 2018-04-27: qty 22

## 2018-04-27 SURGICAL SUPPLY — 7 items
CABLE SURGICAL S-101-97-12 (CABLE) ×3 IMPLANT
HEMOSTAT SURGICEL 2X4 FIBR (HEMOSTASIS) ×3 IMPLANT
LEAD ISOFLEX OPT 1948-58CM (Lead) ×3 IMPLANT
PACEMAKER ASSURITY SR-SF (Pacemaker) ×3 IMPLANT
PAD PRO RADIOLUCENT 2001M-C (PAD) ×3 IMPLANT
SHEATH CLASSIC 7F (SHEATH) ×3 IMPLANT
TRAY PACEMAKER INSERTION (PACKS) ×3 IMPLANT

## 2018-04-27 NOTE — Discharge Instructions (Signed)
° ° °  Supplemental Discharge Instructions for  Pacemaker/Defibrillator Patients  Activity No heavy lifting or vigorous activity with your left/right arm for 6 to 8 weeks.  Do not raise your left/right arm above your head for one week.  Gradually raise your affected arm as drawn below.              05/01/18                  05/02/18                  05/03/18                05/04/18  __  NO DRIVING for  1 week  ; you may begin driving on   09/73/53  .  WOUND CARE - Keep the wound area clean and dry.  Do not get this area wet for one week. No showers for one week; you may shower on  05/04/18   . - The tape/steri-strips on your wound will fall off; do not pull them off.  No bandage is needed on the site.  DO  NOT apply any creams, oils, or ointments to the wound area. - If you notice any drainage or discharge from the wound, any swelling or bruising at the site, or you develop a fever > 101? F after you are discharged home, call the office at once.  Special Instructions - You are still able to use cellular telephones; use the ear opposite the side where you have your pacemaker/defibrillator.  Avoid carrying your cellular phone near your device. - When traveling through airports, show security personnel your identification card to avoid being screened in the metal detectors.  Ask the security personnel to use the hand wand. - Avoid arc welding equipment, MRI testing (magnetic resonance imaging), TENS units (transcutaneous nerve stimulators).  Call the office for questions about other devices. - Avoid electrical appliances that are in poor condition or are not properly grounded. - Microwave ovens are safe to be near or to operate.  Additional information for defibrillator patients should your device go off: - If your device goes off ONCE and you feel fine afterward, notify the device clinic nurses. - If your device goes off ONCE and you do not feel well afterward, call 911. - If your device goes  off TWICE, call 911. - If your device goes off THREE times in one day, call 911.  DO NOT DRIVE YOURSELF OR A FAMILY MEMBER WITH A DEFIBRILLATOR TO THE HOSPITAL--CALL 911.

## 2018-04-27 NOTE — Progress Notes (Signed)
Pt complained of pain on the left lateral side of the chest once she was moved to her bed post procedure.  Dr. Caryl Comes was advised and came to evaluate the pt prior to departing the room.  Dr. Caryl Comes decided to place the patient back on the procedure table and fluoro the chest.  Chest was visualized and was wnl.  Dr. Caryl Comes advised that the lead should be repositioned.  Pt is on the procedure table and the pocket will be reopened.

## 2018-04-27 NOTE — Interval H&P Note (Signed)
History and Physical Interval Note:  04/27/2018 2:16 PM  Megan Olsen  has presented today for surgery, with the diagnosis of tracibradie  The various methods of treatment have been discussed with the patient and family. After consideration of risks, benefits and other options for treatment, the patient has consented to  Procedure(s): PACEMAKER IMPLANT - Dual Chamber (N/A) as a surgical intervention .  The patient's history has been reviewed, patient examined, no change in status, stable for surgery.  I have reviewed the patient's chart and labs.  Questions were answered to the patient's satisfaction.     [name] Questions answered  For single chamber pacemaker today

## 2018-04-27 NOTE — Progress Notes (Signed)
Called back to the lab as patient has developed left chest discomfort not pleuritic but abrupt with transfer  VS stable  No rub Flouro good motion on the silhouette  Presume micro perforation  Will reposition lead acutely

## 2018-04-27 NOTE — Progress Notes (Signed)
Megan Olsen [id number]  [id number]  Preop Dx: microperforation  Postop Dx same/  Procedure:lead repositioning   After the patient complained of chest discomfort.,  It was presumed that there was microperforation.  Fluoroscopic evaluation demonstrated normal silhouette excursion.  The pocket patient was redraped and prepared in a normal fashion.  Lidocaine was infiltrated along the line of the previous incision.  Incision was opened and care was made to remove all the retained sutures in the implanted Surgicel.  The lead was freed up and repositioned.  With the removal of the lead from its position, the patient's pain abated.  It was then completely resolved.  In its repositioned location the RV amplitude was 9 mV with a pacing impedance of 700 ohms and threshold of one V at 0.5 ms.  There is no diaphragmatic pacing at 10 V.   Pocket was copiously irrigated with antibiotic containing saline solution and the lead and the pulse generator were secured to the prepectoral fascia and the wound was closed in 2 layers in normal fashion.  A Steri-Strip Tegaderm covering was applied.  The patient tolerated procedure without apparent complications [name], MD 04/27/2018 5:01 PM

## 2018-04-28 ENCOUNTER — Encounter (HOSPITAL_COMMUNITY): Payer: Self-pay | Admitting: Internal Medicine

## 2018-04-28 ENCOUNTER — Ambulatory Visit (HOSPITAL_COMMUNITY): Payer: Medicare HMO

## 2018-04-28 DIAGNOSIS — E109 Type 1 diabetes mellitus without complications: Secondary | ICD-10-CM | POA: Diagnosis not present

## 2018-04-28 DIAGNOSIS — I4510 Unspecified right bundle-branch block: Secondary | ICD-10-CM | POA: Diagnosis not present

## 2018-04-28 DIAGNOSIS — R918 Other nonspecific abnormal finding of lung field: Secondary | ICD-10-CM | POA: Diagnosis not present

## 2018-04-28 DIAGNOSIS — K219 Gastro-esophageal reflux disease without esophagitis: Secondary | ICD-10-CM | POA: Diagnosis not present

## 2018-04-28 DIAGNOSIS — Z23 Encounter for immunization: Secondary | ICD-10-CM | POA: Diagnosis not present

## 2018-04-28 DIAGNOSIS — I10 Essential (primary) hypertension: Secondary | ICD-10-CM | POA: Diagnosis not present

## 2018-04-28 DIAGNOSIS — I700 Atherosclerosis of aorta: Secondary | ICD-10-CM | POA: Diagnosis not present

## 2018-04-28 DIAGNOSIS — I495 Sick sinus syndrome: Secondary | ICD-10-CM | POA: Diagnosis not present

## 2018-04-28 DIAGNOSIS — I4821 Permanent atrial fibrillation: Secondary | ICD-10-CM | POA: Diagnosis not present

## 2018-04-28 DIAGNOSIS — E039 Hypothyroidism, unspecified: Secondary | ICD-10-CM | POA: Diagnosis not present

## 2018-04-28 MED ORDER — METOPROLOL SUCCINATE ER 25 MG PO TB24: 25.0000 mg | ORAL_TABLET | Freq: Every day | ORAL | 6 refills | Status: DC

## 2018-04-28 MED ORDER — METOPROLOL SUCCINATE ER 25 MG PO TB24
25.0000 mg | ORAL_TABLET | Freq: Every day | ORAL | Status: DC
  Administered 2018-04-28: 10:00:00 25 mg via ORAL
  Filled 2018-04-28: qty 1

## 2018-04-28 NOTE — Discharge Summary (Addendum)
ELECTROPHYSIOLOGY PROCEDURE DISCHARGE SUMMARY    Patient ID: Megan Olsen,  MRN: [id number], DOB/AGE: 12-02-1927 84 y.o.  Admit date: 04/27/2018 Discharge date: 04/28/2018  Primary Care Physician: Briscoe Deutscher, DO  Primary Cardiologist: Dr. Marlou Porch Electrophysiologist: Dr. Caryl Comes  Primary Discharge Diagnosis:  1. Symptomatic bradycardia 2. Tachy-brady syndrome  Secondary Discharge Diagnosis:  1. Permanent AFib     CHA2DS2Vasc is 7, on eliquis, appropriately dosed for age/weight 2. HTN  Allergies  Allergen Reactions  . Propoxyphene N-Acetaminophen Nausea Only  . Celecoxib Itching and Rash  . Fexofenadine Rash  . Penicillins Hives and Rash    Has patient had a PCN reaction causing immediate rash, facial/tongue/throat swelling, SOB or lightheadedness with hypotension: Yes Has patient had a PCN reaction causing severe rash involving mucus membranes or skin necrosis: No Has patient had a PCN reaction that required hospitalization: No Has patient had a PCN reaction occurring within the last 10 years: No If all of the above answers are "NO", then may proceed with Cephalosporin use.      Procedures This Admission:  1.  Implantation of a SJM single chamber PPM on 04/27/18 by Dr Caryl Comes.  The patient received a St Jude pulse generator Serial number [id number],  St Jude 1948 ventricular lead serial [id number] . There were no immediate post procedure complications. 2.  CXR on 04/28/18 demonstrated no pneumothorax status post device implantation.   Brief HPI: Megan Olsen is a 84 y.o. female was referred to electrophysiology in the outpatient setting for consideration of PPM implantation.  Past medical history includes above.  The patient has been found with tachy-brady syndrome and recommended PPM.  Risks, benefits, and alternatives to PPM implantation were reviewed with the patient who wished to proceed.   Hospital Course:  The patient was admitted and underwent  implantation of a PPM with details as outlined above.  She  was monitored on telemetry overnight which demonstrated AFib, mostly V paced, with some fast AF with ambulation, now s/p PPM metoprolol added.  Left chest was without hematoma or ecchymosis.  The device was interrogated and found to be functioning normally.  CXR was obtained and demonstrated no pneumothorax status post device implantation.  Read was discussed with Dr. Caryl Comes, he has sent to her PMD for review and follow up.  I discussed the CXR with the patient, she will follow up with her PMD.   Wound care, arm mobility, and restrictions were reviewed with the patient.  The patient feels well this morning, no CP or SOB, minimal site discomfort, she was examined by Dr. Caryl Comes and considered stable for discharge to home.    Physical Exam: Vitals:   04/27/18 2000 04/28/18 0430 04/28/18 0433 04/28/18 0800  BP: (!) 165/50  (!) 165/59 137/70  Pulse: 82 66  76  Resp: 20 18  18   Temp: 98 F (36.7 C) 97.7 F (36.5 C)  97.8 F (36.6 C)  TempSrc: Oral Oral  Oral  SpO2: 97% 93%  95%  Weight:  53.9 kg    Height:        GEN- The patient is well appearing, alert and oriented x 3 today.   HEENT: normocephalic, atraumatic; sclera clear, conjunctiva pink; hearing intact; oropharynx clear; neck supple, no JVP Lungs- CTA b/l, normal work of breathing.  No wheezes, rales, rhonchi Heart- RRR, no murmurs, rubs or gallops, PMI not laterally displaced GI- soft, non-tender, non-distended Extremities- no clubbing, cyanosis, or edema MS- no significant deformity or atrophy Skin- warm  and dry, no rash or lesion, left chest without hematoma/ecchymosis Psych- euthymic mood, full affect Neuro- no gross deficits   Labs:   Lab Results  Component Value Date   WBC 4.8 04/19/2018   HGB 9.9 (L) 04/19/2018   HCT 30.0 (L) 04/19/2018   MCV 81 04/19/2018   PLT 213 04/19/2018   No results for input(s): NA, K, CL, CO2, BUN, CREATININE, CALCIUM, PROT, BILITOT,  ALKPHOS, ALT, AST, GLUCOSE in the last 168 hours.  Invalid input(s): LABALBU  Discharge Medications:  Allergies as of 04/28/2018      Reactions   Propoxyphene N-acetaminophen Nausea Only   Celecoxib Itching, Rash   Fexofenadine Rash   Penicillins Hives, Rash   Has patient had a PCN reaction causing immediate rash, facial/tongue/throat swelling, SOB or lightheadedness with hypotension: Yes Has patient had a PCN reaction causing severe rash involving mucus membranes or skin necrosis: No Has patient had a PCN reaction that required hospitalization: No Has patient had a PCN reaction occurring within the last 10 years: No If all of the above answers are "NO", then may proceed with Cephalosporin use.      Medication List    TAKE these medications   acetaminophen 650 MG CR tablet Commonly known as:  TYLENOL Take 650-1,300 mg by mouth every 8 (eight) hours as needed for pain.   apixaban 2.5 MG Tabs tablet Commonly known as:  ELIQUIS Take 1 tablet (2.5 mg total) by mouth 2 (two) times daily. What changed:  additional instructions Notes to patient:  Resume Saturday 04/30/18 morning dose   dexlansoprazole 60 MG capsule Commonly known as:  DEXILANT Take 1 capsule (60 mg total) by mouth daily. What changed:    when to take this  additional instructions   hydrochlorothiazide 12.5 MG capsule Commonly known as:  MICROZIDE Take 1 capsule (12.5 mg total) by mouth daily. What changed:  when to take this   LORazepam 1 MG tablet Commonly known as:  ATIVAN TAKE 1/2 TABLET IN THE MORNING AND 1 TABLET AT BEDTIME What changed:  See the new instructions.   metoprolol succinate 25 MG 24 hr tablet Commonly known as:  TOPROL-XL Take 1 tablet (25 mg total) by mouth daily. Start taking on:  04/29/2018   NON FORMULARY Place 1 Dose into both eyes every 6 (six) weeks. Receives injections into both eyes every 6 weeks at [facility])   PRESERVISION AREDS  2 Caps Take 1 capsule by mouth 2 (two) times daily.   THERATEARS 0.25 % Soln Generic drug:  Carboxymethylcellulose Sodium Place 1 drop into both eyes 3 (three) times daily as needed (for dry/irritated eyes.).   Vitamin D3 2000 units Tabs Take 2,000 Units by mouth daily.       Disposition:  Home Discharge Instructions    Diet - low sodium heart healthy   Complete by:  As directed    Increase activity slowly   Complete by:  As directed      Follow-up Information    [place] [facility] Follow up on 05/05/2018.   Specialty:  Cardiology Why:  2:00PM, wound check visit Contact information: [address] [place]       Deboraha Sprang, MD Follow up on 08/02/2018.   Specialty:  Cardiology Why:  2:15PM Contact information: [address] [place] [address] [phone]           Duration of Discharge Encounter: Greater than  30 minutes including physician time.  Venetia Night, PA-C 04/28/2018 11:19 AM  Patient seen and instructions given.  Rapid rates with ambulation.  We will add a low-dose beta-blocker to a calcium blocker for rate control as well as maintaining her blood pressure

## 2018-04-29 ENCOUNTER — Telehealth: Payer: Self-pay | Admitting: *Deleted

## 2018-04-29 ENCOUNTER — Ambulatory Visit: Payer: Medicare HMO | Admitting: Family Medicine

## 2018-04-29 ENCOUNTER — Encounter (HOSPITAL_COMMUNITY): Payer: Self-pay | Admitting: Internal Medicine

## 2018-04-29 NOTE — Telephone Encounter (Signed)
Per chart review: Admit date: 04/27/2018 Discharge date: 04/28/2018  Primary Care Physician: Briscoe Deutscher, DO  Primary Cardiologist: Dr. Marlou Porch Electrophysiologist: Dr. Caryl Comes  Primary Discharge Diagnosis:  1. Symptomatic bradycardia 2. Tachy-brady syndrome  Secondary Discharge Diagnosis:  1. Permanent AFib     CHA2DS2Vasc is 7, on eliquis, appropriately dosed for age/weight 2. HTN ________________________________________________________________________ Per telephone encounter: Transition Care Management Follow-up Telephone Call   Date discharged? 04/28/18   How have you been since you were released from the hospital? "good"   Do you understand why you were in the hospital? yes   Do you understand the discharge instructions? yes   Where were you discharged to? Home   Items Reviewed:  Medications reviewed: yes  Allergies reviewed: yes  Dietary changes reviewed: yes  Referrals reviewed: yes   Functional Questionnaire:   Activities of Daily Living (ADLs):   She states they are independent in the following: ambulation, bathing and hygiene, feeding, continence, grooming, toileting and dressing States they require assistance with the following: none   Any transportation issues/concerns?: no   Any patient concerns? no   Confirmed importance and date/time of follow-up visits scheduled yes  Provider Appointment booked with Dr Juleen China 05/03/18 1:00  Confirmed with patient if condition begins to worsen call PCP or go to the ER.  Patient was given the office number and encouraged to call back with question or concerns.  : yes

## 2018-05-02 NOTE — Progress Notes (Signed)
Megan Olsen is a 84 y.o. female is here for follow up.  History of Present Illness:   HPI:  Patient admitted to [facility] 10/9-10/10 for tachy-brady syndrome and pacer implanted by Dr. Caryl Comes. Did well during hospital stay. Home now and healing. No new symptoms. Xray for placement noted nodule.   Cardiovascular ROS: negative for - chest pain, edema, palpitations or shortness of breath.No cough, weight loss, fever, rash.  Admit date: 04/27/2018 Discharge date: 04/28/2018  Primary Care Physician: Briscoe Deutscher, DO  Primary Cardiologist: Dr. Marlou Porch Electrophysiologist: Dr. Caryl Comes  Primary Discharge Diagnosis:  1. Symptomatic bradycardia 2. Tachy-brady syndrome  Secondary Discharge Diagnosis:  1. Permanent AFib     CHA2DS2Vasc is 7, on eliquis, appropriately dosed for age/weight 2. HTN  Procedures This Admission:  1.  Implantation of a SJM single chamber PPM on 04/27/18 by Dr Caryl Comes.  The patient received a St Jude pulse generator Serial number [id number],  St Jude 1948 ventricular lead serial [id number] . There were no immediate post procedure complications. 2.  CXR on 04/28/18 demonstrated no pneumothorax status post device implantation.   EXAM: CHEST - 2 VIEW  COMPARISON:  01/02/2013.  FINDINGS: Trachea is midline. Heart is enlarged. Single lead pacemaker lead tip terminates in the right ventricle. Thoracic aorta is calcified. Lungs are hyperinflated with biapical pleuroparenchymal scarring. Nodular airspace opacification in the lingula. No pneumothorax. No pleural fluid.  IMPRESSION: 1. Pacemaker placement without complicating feature. 2. Nodular opacity in the lingula, possibly due to post infectious or post inflammatory scarring when compared with 01/02/2013. A true nodule, however, cannot be excluded. Consider CT chest without contrast in further evaluation, as clinically indicated. 3.  Aortic atherosclerosis (ICD10-170.0).  CBC Latest Ref Rng & Units  05/03/2018 04/19/2018 01/26/2018  WBC 4.0 - 10.5 K/uL 6.3 4.8 5.8  Hemoglobin 12.0 - 15.0 g/dL 10.1(L) 9.9(L) 10.5(L)  Hematocrit 36.0 - 46.0 % 31.8(L) 30.0(L) 33.0(L)  Platelets 150.0 - 400.0 K/uL 217.0 213 188.0   There are no preventive care reminders to display for this patient.   Depression screen [facility] 2/9 01/26/2018 05/06/2017 08/28/2015  Decreased Interest 0 1 0  Down, Depressed, Hopeless 0 0 0  PHQ - 2 Score 0 1 0  Altered sleeping 0 - -  Tired, decreased energy 0 - -  Change in appetite 0 - -  Feeling bad or failure about yourself  0 - -  Trouble concentrating 0 - -  Moving slowly or fidgety/restless 0 - -  Suicidal thoughts 0 - -  PHQ-9 Score 0 - -  Difficult doing work/chores Not difficult at all - -   PMHx, SurgHx, SocialHx, FamHx, Medications, and Allergies were reviewed in the Visit Navigator and updated as appropriate.   Patient Active Problem List   Diagnosis Date Noted  . Tachy-brady syndrome ([place]) 04/27/2018  . Cervical spondylosis without myelopathy 03/11/2018  . Myofascial pain syndrome 03/11/2018  . Cervicalgia 03/11/2018  . Left ear pain 03/11/2018  . Insomnia 01/26/2018  . High risk medication use 01/26/2018  . Macular degeneration of both eyes 01/26/2018  . S/P right THA, AA 02/23/2017  . Hypothyroidism 09/07/2014  . A-fib [facility]), s/p catheter ablation of SVT in 2002 07/06/2014  . History of fracture of left hip 07/06/2014  . Benign essential HTN 07/06/2014  . GERD (gastroesophageal reflux disease) 07/06/2014  . Anxiety disorder 07/06/2014  . History of CVA (cerebrovascular accident), without residual effects 07/06/2014  . Iron deficiency anemia, unspecified 01/02/2013  . Constipation, intermittent 08/04/2007  .  Depression 01/11/2007  . Allergic rhinitis 01/11/2007  . Osteoarthritis 01/11/2007  . Osteoporosis 01/11/2007   Social History   Tobacco Use  . Smoking status: Never Smoker  . Smokeless tobacco: Never Used  Substance Use Topics  .  Alcohol use: Yes    Comment: 04/27/2018 "a beer q 3 months or so; if that"  . Drug use: Never   Current Medications and Allergies:   .  apixaban (ELIQUIS) 2.5 MG TABS tablet, Take 1 tablet (2.5 mg total) by mouth 2 (two) times daily. (Patient taking differently: Take 2.5 mg by mouth 2 (two) times daily. (0830 & 1830)), Disp: 60 tablet, Rfl: 11 .  Carboxymethylcellulose Sodium (THERATEARS) 0.25 % SOLN, Place 1 drop into both eyes 3 (three) times daily as needed (for dry/irritated eyes.)., Disp: , Rfl:  .  Cholecalciferol (VITAMIN D3) 2000 units TABS, Take 2,000 Units by mouth daily., Disp: , Rfl:  .  dexlansoprazole (DEXILANT) 60 MG capsule, Take 1 capsule (60 mg total) by mouth daily. (Patient taking differently: Take 60 mg by mouth daily before breakfast. 30 minutes before breakfast), Disp: 90 capsule, Rfl: 3 .  hydrochlorothiazide (MICROZIDE) 12.5 MG capsule, Take 1 capsule (12.5 mg total) by mouth daily. (Patient taking differently: Take 12.5 mg by mouth daily with breakfast. ), Disp: 90 capsule, Rfl: 3 .  LORazepam (ATIVAN) 1 MG tablet, TAKE 1/2 TABLET IN THE MORNING AND 1 TABLET AT BEDTIME (Patient taking differently: Take 0.5-1 mg by mouth See admin instructions. Take 0.5 tablet (0.5 mg) at 10 in the morning & take 1 tablet (1 mg) at bedtime.), Disp: 45 tablet, Rfl: 0 .  metoprolol succinate (TOPROL-XL) 25 MG 24 hr tablet, Take 1 tablet (25 mg total) by mouth daily., Disp: 30 tablet, Rfl: 6 .  Multiple Vitamins-Minerals (PRESERVISION AREDS 2) CAPS, Take 1 capsule by mouth 2 (two) times daily., Disp: 90 capsule, Rfl: 3 .  NON FORMULARY, Place 1 Dose into both eyes every 6 (six) weeks. Receives injections into both eyes every 6 weeks at [employer]), Disp: , Rfl:    Allergies  Allergen Reactions  . Propoxyphene N-Acetaminophen Nausea Only  . Celecoxib Itching and Rash  . Fexofenadine Rash  . Penicillins Hives and Rash    Has patient had a PCN  reaction causing immediate rash, facial/tongue/throat swelling, SOB or lightheadedness with hypotension: Yes Has patient had a PCN reaction causing severe rash involving mucus membranes or skin necrosis: No Has patient had a PCN reaction that required hospitalization: No Has patient had a PCN reaction occurring within the last 10 years: No If all of the above answers are "NO", then may proceed with Cephalosporin use.    Review of Systems   Pertinent items are noted in the HPI. Otherwise, ROS is negative.  Vitals:   Vitals:   05/03/18 1242  BP: (!) 148/72  Pulse: (!) 58  Temp: 97.8 F (36.6 C)  TempSrc: Oral  SpO2: 98%  Weight: 116 lb 3.2 oz (52.7 kg)  Height: 5\' 7"  (1.702 m)     Body mass index is 18.2 kg/m.  Physical Exam:   Physical Exam  Constitutional: She appears well-nourished.  HENT:  Head: Normocephalic and atraumatic.  Eyes: Pupils are equal, round, and reactive to light. EOM are normal.  Neck: Normal range of motion. Neck supple.  Cardiovascular: Normal rate, regular rhythm, normal heart sounds and intact distal pulses.  Pulmonary/Chest: Effort normal.  Abdominal: Soft.  Skin: Skin is warm.  Psychiatric: She has a  normal mood and affect. Her behavior is normal.  Nursing note and vitals reviewed.  Assessment and Plan:   Elisabet was seen today for hospitalization follow-up.  Diagnoses and all orders for this visit:  Hospital discharge follow-up Comments: Medication reconciliation:  [x]   Medication list updated [x]   New medication list given to patient/family/caregiver  Referrals: [x]   None needed []   Referrals made to:   Community resources identified for patient/family:  [x]   None needed  []   Home health agency []   Assisted living  []   Hospice  []   Support group  []   Education program  Durable medical equipment ordered:  [x]   None needed  []   DME ordered:   Additional communication delivered or planned:  [x]   Family/Caregiver:  []    Specialists:  []   Other:  Patient education: Topics discussed: AS ABOVE Handouts given: SEE AVS  Anemia Comments: Chronic. Will add labs. Orders: -     CBC with Differential/Platelet -     Comprehensive metabolic panel -     Iron, TIBC and Ferritin Panel -     Vitamin B12  Abnormal chest x-ray Comments: Possible nodule. Patient not interested in work up since she does not want intervention.   Cervicalgia Comments: s/p 2 injections with Dr. Ernestina Patches with improvement. Was scheduled to go back but needed to see Cardiology first.   . Reviewed expectations re: course of current medical issues. . Discussed self-management of symptoms. . Outlined signs and symptoms indicating need for more acute intervention. . Patient verbalized understanding and all questions were answered. Marland Kitchen Health Maintenance issues including appropriate healthy diet, exercise, and smoking avoidance were discussed with patient. . See orders for this visit as documented in the electronic medical record. . Patient received an After Visit Summary.  Briscoe Deutscher, DO [place], [facility] 05/05/2018

## 2018-05-03 ENCOUNTER — Ambulatory Visit (INDEPENDENT_AMBULATORY_CARE_PROVIDER_SITE_OTHER): Payer: Medicare HMO | Admitting: Family Medicine

## 2018-05-03 ENCOUNTER — Encounter: Payer: Self-pay | Admitting: Family Medicine

## 2018-05-03 VITALS — BP 148/72 | HR 58 | Temp 97.8°F | Ht 67.0 in | Wt 116.2 lb

## 2018-05-03 DIAGNOSIS — D649 Anemia, unspecified: Secondary | ICD-10-CM | POA: Diagnosis not present

## 2018-05-03 DIAGNOSIS — M542 Cervicalgia: Secondary | ICD-10-CM

## 2018-05-03 DIAGNOSIS — Z09 Encounter for follow-up examination after completed treatment for conditions other than malignant neoplasm: Secondary | ICD-10-CM

## 2018-05-03 DIAGNOSIS — R9389 Abnormal findings on diagnostic imaging of other specified body structures: Secondary | ICD-10-CM

## 2018-05-03 LAB — COMPREHENSIVE METABOLIC PANEL
ALT: 15 U/L (ref 0–35)
AST: 18 U/L (ref 0–37)
Albumin: 4 g/dL (ref 3.5–5.2)
Alkaline Phosphatase: 121 U/L — ABNORMAL HIGH (ref 39–117)
BUN: 20 mg/dL (ref 6–23)
CO2: 29 mEq/L (ref 19–32)
Calcium: 9.1 mg/dL (ref 8.4–10.5)
Chloride: 101 mEq/L (ref 96–112)
Creatinine, Ser: 0.67 mg/dL (ref 0.40–1.20)
GFR: 88.19 mL/min (ref >60.00)
Glucose, Bld: 81 mg/dL (ref 70–99)
Potassium: 4.4 mEq/L (ref 3.5–5.1)
Sodium: 137 mEq/L (ref 135–145)
Total Bilirubin: 0.3 mg/dL (ref 0.2–1.2)
Total Protein: 6.3 g/dL (ref 6.0–8.3)

## 2018-05-03 LAB — CBC WITH DIFFERENTIAL/PLATELET
Basophils Absolute: 0 10*3/uL (ref 0.0–0.1)
Basophils Relative: 0.7 % (ref 0.0–3.0)
Eosinophils Absolute: 0.2 10*3/uL (ref 0.0–0.7)
Eosinophils Relative: 2.9 % (ref 0.0–5.0)
HCT: 31.8 % — ABNORMAL LOW (ref 36.0–46.0)
Hemoglobin: 10.1 g/dL — ABNORMAL LOW (ref 12.0–15.0)
Lymphocytes Relative: 12.4 % (ref 12.0–46.0)
Lymphs Abs: 0.8 10*3/uL (ref 0.7–4.0)
MCHC: 31.9 g/dL (ref 30.0–36.0)
MCV: 78.4 fl (ref 78.0–100.0)
Monocytes Absolute: 0.6 10*3/uL (ref 0.1–1.0)
Monocytes Relative: 9.7 % (ref 3.0–12.0)
Neutro Abs: 4.7 10*3/uL (ref 1.4–7.7)
Neutrophils Relative %: 74.3 % (ref 43.0–77.0)
Platelets: 217 10*3/uL (ref 150.0–400.0)
RBC: 4.06 Mil/uL (ref 3.87–5.11)
RDW: 16.8 % — ABNORMAL HIGH (ref 11.5–15.5)
WBC: 6.3 10*3/uL (ref 4.0–10.5)

## 2018-05-03 LAB — VITAMIN B12: Vitamin B-12: 248 pg/mL (ref 211–911)

## 2018-05-04 LAB — IRON,TIBC AND FERRITIN PANEL
%SAT: 4 % (calc) — ABNORMAL LOW (ref 16–45)
Ferritin: 7 ng/mL — ABNORMAL LOW (ref 16–288)
Iron: 19 ug/dL — ABNORMAL LOW (ref 45–160)
TIBC: 455 mcg/dL (calc) — ABNORMAL HIGH (ref 250–450)

## 2018-05-05 ENCOUNTER — Encounter: Payer: Self-pay | Admitting: Family Medicine

## 2018-05-05 ENCOUNTER — Ambulatory Visit (INDEPENDENT_AMBULATORY_CARE_PROVIDER_SITE_OTHER): Payer: Medicare HMO | Admitting: *Deleted

## 2018-05-05 ENCOUNTER — Ambulatory Visit: Payer: Medicare HMO

## 2018-05-05 DIAGNOSIS — I495 Sick sinus syndrome: Secondary | ICD-10-CM

## 2018-05-06 LAB — CUP PACEART INCLINIC DEVICE CHECK
Battery Voltage: 3.11 V
Implantable Lead Model: 1948
Implantable Pulse Generator Implant Date: 20191009
Lead Channel Pacing Threshold Amplitude: 0.75 V
Lead Channel Sensing Intrinsic Amplitude: 11.8 mV
Lead Channel Setting Pacing Amplitude: 3.5 V
Lead Channel Setting Pacing Pulse Width: 0.5 ms
MDC IDC LEAD IMPLANT DT: 20191009
MDC IDC LEAD LOCATION: 753860
MDC IDC MSMT BATTERY REMAINING LONGEVITY: 99 mo
MDC IDC MSMT LEADCHNL RV IMPEDANCE VALUE: 575 Ohm
MDC IDC MSMT LEADCHNL RV PACING THRESHOLD AMPLITUDE: 0.75 V
MDC IDC MSMT LEADCHNL RV PACING THRESHOLD PULSEWIDTH: 0.5 ms
MDC IDC MSMT LEADCHNL RV PACING THRESHOLD PULSEWIDTH: 0.5 ms
MDC IDC PG SERIAL: 9067558
MDC IDC SESS DTM: 20191017131357
MDC IDC SET LEADCHNL RV SENSING SENSITIVITY: 2 mV
MDC IDC STAT BRADY RV PERCENT PACED: 65 %
Pulse Gen Model: 1272

## 2018-05-06 NOTE — Progress Notes (Signed)
Wound check appointment. Steri-strips removed. Wound without redness or edema. Incision edges approximated, wound well healed. Normal device function. Threshold, sensing, and impedance consistent with implant measurements. Device programmed at 3.5V for extra safety margin until 3 month visit. Histogram distribution appropriate for patient and level of activity. (1) high ventricular rate episode noted x 71mins35 - AF/RVR. Patient educated about wound care, arm mobility, lifting restrictions. ROV in 3 months with SK.

## 2018-05-10 ENCOUNTER — Ambulatory Visit: Payer: Medicare HMO

## 2018-05-11 ENCOUNTER — Ambulatory Visit: Payer: Medicare HMO

## 2018-05-18 ENCOUNTER — Other Ambulatory Visit: Payer: Self-pay | Admitting: Family Medicine

## 2018-05-19 NOTE — Telephone Encounter (Signed)
Last app 05/03/18 No f/u  Last script #45 no rf

## 2018-05-25 ENCOUNTER — Other Ambulatory Visit: Payer: Self-pay | Admitting: Family Medicine

## 2018-05-25 ENCOUNTER — Ambulatory Visit (INDEPENDENT_AMBULATORY_CARE_PROVIDER_SITE_OTHER): Payer: Medicare HMO

## 2018-05-25 VITALS — BP 140/82 | HR 66 | Temp 97.4°F | Ht 67.0 in | Wt 117.4 lb

## 2018-05-25 DIAGNOSIS — Z0000 Encounter for general adult medical examination without abnormal findings: Secondary | ICD-10-CM | POA: Diagnosis not present

## 2018-05-25 NOTE — Progress Notes (Signed)
PCP notes: Hospital Follow Up with Dr. Juleen China 05/03/18   Health maintenance:Up to Date   Abnormal screenings: 6CIT score of 18. See Nurse Concerns   Patient concerns: None   Nurse concerns: 6CIT was an 18. Patient became upset and agitated. MMS Exam not completed due to patient getting upset. Patient was able to recall sentence at end of visit unprompted once she calmed down. Recommend a MMSE at next visit with PCP.   Next PCP appt: Schedule as Needed

## 2018-05-25 NOTE — Progress Notes (Signed)
Subjective:   Megan Olsen is a 84 y.o. female who presents for Medicare Annual (Subsequent) preventive examination.  Review of Systems:  No ROS.  Medicare Wellness Visit. Additional risk factors are reflected in the social history.  Cardiac Risk Factors include: advanced age (>78men, >52 women) Patient lives in a single story apartment. She does not have any pets. She has great support from a couple of her neighbors, one of whom is at this visit with her. She enjoys going out to out, gardening, and she is an inactive member at [employer].  Patient goes to bed between 6:30-11pm. Patient gets up around 3-4 times a night to go to the bathroom. Patient gets up around 7-8:30. Sometimes she feels rested and sometimes she doesn't.    Objective:     Vitals: There were no vitals taken for this visit.  There is no height or weight on file to calculate BMI.  Advanced Directives 05/25/2018 04/27/2018 05/06/2017 02/23/2017 02/23/2017 02/15/2017 07/06/2014  Does Patient Have a Medical Advance Directive? Yes No Yes Yes Yes Yes No  Type of Advance Directive Living will - - [place];Living will [place];Living will [place];Living will -  Does patient want to make changes to medical advance directive? No - Patient declined - - Yes (Inpatient - patient requests chaplain consult to change a medical advance directive) No - Patient declined No - Patient declined -  Copy of [place] in Chart? - - - No - copy requested No - copy requested No - copy requested -  Would patient like information on creating a medical advance directive? - No - Patient declined - - - - No - patient declined information    Tobacco Social History   Tobacco Use  Smoking Status Never Smoker  Smokeless Tobacco Never Used     Counseling given: Not Answered    Past Medical History:  Diagnosis Date  . Age-related macular degeneration,  wet, both eyes ([place])   . Anxiety disorder 07/06/2014  . Arthritis    "all over" (04/27/2018)  . Atrial fibrillation ([place])    catheter ablation of SVT in 2002  . Benign essential HTN 07/06/2014  . Constipation, intermittent 08/04/2007  . CVA (cerebrovascular accident) ([place]) 2003   left brain; denies residual on 04/27/2018  . Depression   . GERD (gastroesophageal reflux disease)   . Heart murmur    hx (04/27/2018)  . Hypothyroidism 09/07/2014   "off RX now" (04/27/2018)  . Iron deficiency anemia   . Iron deficiency anemia, unspecified 01/02/2013  . Osteoarthritis   . Osteoporosis 01/11/2007  . Pneumonia    "one time; years and years ago" (04/27/2018)  . Presence of permanent cardiac pacemaker 04/27/2018    Dual Chamber  . Right BBB/left ant fasc block    Past Surgical History:  Procedure Laterality Date  . APPENDECTOMY    . CATARACT EXTRACTION W/ INTRAOCULAR LENS  IMPLANT, BILATERAL Bilateral   . CHOLECYSTECTOMY OPEN    . COLONOSCOPY    . EYE SURGERY    . FRACTURE SURGERY    . HIP PINNING,CANNULATED Left 07/07/2014   Procedure: CANNULATED HIP PINNING;  Surgeon: Mauri Pole, MD;  [facility];  Service: Orthopedics;  Laterality: Left;  . INSERT / REPLACE / REMOVE PACEMAKER  04/27/2018    Dual Chamber  . JOINT REPLACEMENT    . LAPAROSCOPIC OVARIAN CYSTECTOMY    . NASAL SEPTUM SURGERY    .  PACEMAKER IMPLANT N/A 04/27/2018   Procedure: PACEMAKER IMPLANT - Dual Chamber;  Surgeon: Deboraha Sprang, MD;  Location: [facility];  Service: Cardiovascular;  Laterality: N/A;  . SVT ABLATION  2002  . TONSILLECTOMY    . TOTAL ABDOMINAL HYSTERECTOMY    . TOTAL HIP ARTHROPLASTY Right 02/23/2017   Procedure: RIGHT TOTAL HIP ARTHROPLASTY ANTERIOR APPROACH;  Surgeon: Paralee Cancel, MD;  [facility];  Service: Orthopedics;  Laterality: Right;  . VITRECTOMY Right 2008   dr Zadie Rhine.  Vitrectomy and removal of tissue.    Family History  Problem Relation Age of Onset  . Early death  Mother   . Nephritis Mother   . Kidney disease Mother   . Heart attack Father   . Heart disease Sister   . Dementia Sister   . Heart attack Brother   . Dementia Sister    Social History   Socioeconomic History  . Marital status: Single    Spouse name: Not on file  . Number of children: Not on file  . Years of education: Not on file  . Highest education level: Not on file  Occupational History  . Not on file  Social Needs  . Financial resource strain: Not on file  . Food insecurity:    Worry: Not on file    Inability: Not on file  . Transportation needs:    Medical: Not on file    Non-medical: Not on file  Tobacco Use  . Smoking status: Never Smoker  . Smokeless tobacco: Never Used  Substance and Sexual Activity  . Alcohol use: Yes    Comment: 04/27/2018 "a beer q 3 months or so; if that"  . Drug use: Never  . Sexual activity: Not Currently  Lifestyle  . Physical activity:    Days per week: Not on file    Minutes per session: Not on file  . Stress: Not on file  Relationships  . Social connections:    Talks on phone: Not on file    Gets together: Not on file    Attends religious service: Not on file    Active member of club or organization: Not on file    Attends meetings of clubs or organizations: Not on file    Relationship status: Not on file  Other Topics Concern  . Not on file  Social History Narrative  . Not on file    Outpatient Encounter Medications as of 05/25/2018  Medication Sig  . acetaminophen (TYLENOL) 650 MG CR tablet Take 650-1,300 mg by mouth every 8 (eight) hours as needed for pain.  Marland Kitchen apixaban (ELIQUIS) 2.5 MG TABS tablet Take 1 tablet (2.5 mg total) by mouth 2 (two) times daily. (Patient taking differently: Take 2.5 mg by mouth 2 (two) times daily. (0830 & 1830))  . Carboxymethylcellulose Sodium (THERATEARS) 0.25 % SOLN Place 1 drop into both eyes 3 (three) times daily as needed (for dry/irritated eyes.).  Marland Kitchen Cholecalciferol (VITAMIN D3) 2000  units TABS Take 2,000 Units by mouth daily.  Marland Kitchen dexlansoprazole (DEXILANT) 60 MG capsule Take 1 capsule (60 mg total) by mouth daily. (Patient taking differently: Take 60 mg by mouth daily before breakfast. 30 minutes before breakfast)  . hydrochlorothiazide (MICROZIDE) 12.5 MG capsule Take 1 capsule (12.5 mg total) by mouth daily. (Patient taking differently: Take 12.5 mg by mouth daily with breakfast. )  . LORazepam (ATIVAN) 1 MG tablet TAKE 1/2 TABLET IN THE MORNING AND 1 TABLET AT BEDTIME  . metoprolol succinate (  TOPROL-XL) 25 MG 24 hr tablet Take 1 tablet (25 mg total) by mouth daily.  . Multiple Vitamins-Minerals (PRESERVISION AREDS 2) CAPS Take 1 capsule by mouth 2 (two) times daily.  . NON FORMULARY Place 1 Dose into both eyes every 6 (six) weeks. Receives injections into both eyes every 6 weeks at [facility] office [facility])   No facility-administered encounter medications on file as of 05/25/2018.     Activities of Daily Living In your present state of health, do you have any difficulty performing the following activities: 05/25/2018 04/27/2018  Hearing? N -  Vision? N -  Difficulty concentrating or making decisions? N -  Walking or climbing stairs? N -  Comment - -  Dressing or bathing? N -  Doing errands, shopping? N N  Preparing Food and eating ? N -  Using the Toilet? N -  In the past six months, have you accidently leaked urine? N -  Do you have problems with loss of bowel control? N -  Managing your Medications? N -  Managing your Finances? N -  Housekeeping or managing your Housekeeping? N -  Some recent data might be hidden    Patient Care Team: Briscoe Deutscher, DO as PCP - General (Family Medicine)    Assessment:   This is a routine wellness examination for Lorie.  Exercise Activities and Dietary recommendations Current Exercise Habits: The patient does not participate in regular exercise at present(Used to participate in the [place] program, does not currently participate), Exercise limited by: None identified   Breakfast: Oatmeal with raisins, omelette with spinach, peppers, and onions with decaffeinated coffee to drink or hot tea with lemon juice or honey  Lunch: Salad or sandwich, leftovers from the night before, drink water  Dinner: hot dogs and green beans, baked chicken  Drinks water  Sweet tooth, likes chocolate, snacks on nuts frequently, eats fruits frequently too Goals    . Exercise 150 minutes per week (moderate activity)     Can start going to the [facility] which is near you Will make a plan to get our more when her hip is better        Fall Risk Fall Risk  05/25/2018 01/26/2018 05/06/2017 08/28/2015 07/27/2014  Falls in the past year? 1 No Yes No Yes  Number falls in past yr: 0 - 1 - 1  Injury with Fall? 0 - Yes - Yes  Risk Factor Category  - - - - High Fall Risk  Risk for fall due to : - - Impaired balance/gait - Impaired balance/gait  Follow up - - Education provided - -    Depression Screen PHQ 2/9 Scores 05/25/2018 01/26/2018 05/06/2017 08/28/2015  PHQ - 2 Score 0 0 1 0  PHQ- 9 Score 3 0 - -     Cognitive Function     6CIT Screen 05/25/2018  What Year? 4 points  What month? 0 points  What time? 0 points  Count back from 20 4 points  Months in reverse 0 points  Repeat phrase 10 points  Total Score 18    Immunization History  Administered Date(s) Administered  . Influenza Split 04/24/2011, 05/02/2012  . Influenza Whole 04/19/1998, 06/03/2007, 05/08/2008, 04/03/2010  . Influenza, High Dose Seasonal PF 05/31/2015, 05/21/2016, 04/14/2017, 04/28/2018  . Influenza,inj,Quad PF,6+ Mos 04/10/2013, 03/30/2014  . Pneumococcal Conjugate-13 05/31/2015  . Pneumococcal Polysaccharide-23 07/20/1996, 05/02/2012  . Tdap 05/02/2012  . Zoster 04/03/2010      Screening Tests Health Maintenance  Topic Date Due  . TETANUS/TDAP  05/02/2022  . INFLUENZA VACCINE  Completed  . DEXA SCAN   Completed  . PNA vac Low Risk Adult  Completed        Plan:    Follow Up with PCP as Advised   I have personally reviewed and noted the following in the patient's chart:   . Medical and social history . Use of alcohol, tobacco or illicit drugs  . Current medications and supplements . Functional ability and status . Nutritional status . Physical activity . Advanced directives . List of other physicians . Vitals . Screenings to include cognitive, depression, and falls . Referrals and appointments  In addition, I have reviewed and discussed with patient certain preventive protocols, quality metrics, and best practice recommendations. A written personalized care plan for preventive services as well as general preventive health recommendations were provided to patient.     [place], [place]  56/02/1274

## 2018-05-25 NOTE — Patient Instructions (Signed)
Megan Olsen , Thank you for taking time to come for your Medicare Wellness Visit. I appreciate your ongoing commitment to your health goals. Please review the following plan we discussed and let me know if I can assist you in the future.   These are the goals we discussed: Goals    . Exercise 150 minutes per week (moderate activity)     Can start going to the [facility] which is near you Will make a plan to get our more when her hip is better        This is a list of the screening recommended for you and due dates:  Health Maintenance  Topic Date Due  . Tetanus Vaccine  05/02/2022  . Flu Shot  Completed  . DEXA scan (bone density measurement)  Completed  . Pneumonia vaccines  Completed    Preventive Care for Adults  A healthy lifestyle and preventive care can promote health and wellness. Preventive health guidelines for adults include the following key practices.  . A routine yearly physical is a good way to check with your health care provider about your health and preventive screening. It is a chance to share any concerns and updates on your health and to receive a thorough exam.  . Visit your dentist for a routine exam and preventive care every 6 months. Brush your teeth twice a day and floss once a day. Good oral hygiene prevents tooth decay and gum disease.  . The frequency of eye exams is based on your age, health, family medical history, use  of contact lenses, and other factors. Follow your health care provider's recommendations for frequency of eye exams.  . Eat a healthy diet. Foods like vegetables, fruits, whole grains, low-fat dairy products, and lean protein foods contain the nutrients you need without too many calories. Decrease your intake of foods high in solid fats, added sugars, and salt. Eat the right amount of calories for you. Get information about a proper diet from your health care provider, if necessary.  . Regular physical exercise is one of the most  important things you can do for your health. Most adults should get at least 150 minutes of moderate-intensity exercise (any activity that increases your heart rate and causes you to sweat) each week. In addition, most adults need muscle-strengthening exercises on 2 or more days a week.  Silver Sneakers may be a benefit available to you. To determine eligibility, you may visit the website: www.silversneakers.com or contact program at (773)696-7824 Mon-Fri between 8AM-8PM.   . Maintain a healthy weight. The body mass index (BMI) is a screening tool to identify possible weight problems. It provides an estimate of body fat based on height and weight. Your health care provider can find your BMI and can help you achieve or maintain a healthy weight.   For adults 20 years and older: ? A BMI below 18.5 is considered underweight. ? A BMI of 18.5 to 24.9 is normal. ? A BMI of 25 to 29.9 is considered overweight. ? A BMI of 30 and above is considered obese.   . Maintain normal blood lipids and cholesterol levels by exercising and minimizing your intake of saturated fat. Eat a balanced diet with plenty of fruit and vegetables. Blood tests for lipids and cholesterol should begin at age 74 and be repeated every 5 years. If your lipid or cholesterol levels are high, you are over 50, or you are at high risk for heart disease, you may need your  cholesterol levels checked more frequently. Ongoing high lipid and cholesterol levels should be treated with medicines if diet and exercise are not working.  . If you smoke, find out from your health care provider how to quit. If you do not use tobacco, please do not start.  . If you choose to drink alcohol, please do not consume more than 2 drinks per day. One drink is considered to be 12 ounces (355 mL) of beer, 5 ounces (148 mL) of wine, or 1.5 ounces (44 mL) of liquor.  . If you are 61-52 years old, ask your health care provider if you should take aspirin to prevent  strokes.  . Use sunscreen. Apply sunscreen liberally and repeatedly throughout the day. You should seek shade when your shadow is shorter than you. Protect yourself by wearing long sleeves, pants, a wide-brimmed hat, and sunglasses year round, whenever you are outdoors.  . Once a month, do a whole body skin exam, using a mirror to look at the skin on your back. Tell your health care provider of new moles, moles that have irregular borders, moles that are larger than a pencil eraser, or moles that have changed in shape or color.

## 2018-05-25 NOTE — Telephone Encounter (Signed)
Copied from [place] [id number] [id number]. Topic: Quick Communication - Rx Refill/Question >> May 25, 2018  4:46 PM Keene Breath wrote: Medication: LORazepam (ATIVAN) 1 MG tablet  Patient called to request a refill for the above medication.  CB# [phone]  Preferred Pharmacy (with phone number or street name): [id number] [place], [place]. AT [address] [place] [phone] (Phone) [phone] (Fax)

## 2018-05-25 NOTE — Telephone Encounter (Signed)
Spoke with Megan Olsen at [facility] who states that last prescription filled for the pt was in September. Prescription written on 05/19/18 was not received by the pharmacy.

## 2018-05-25 NOTE — Progress Notes (Signed)
I have reviewed documentation for AWV and Advance Care planning provided by [facility], I agree with documentation, I was immediately available for any questions. Marigny Borre, DO   

## 2018-05-25 NOTE — Telephone Encounter (Signed)
Requested medication (s) are due for refill today: yes  Requested medication (s) are on the active medication list: yes  Last refill:  03/2018 [facility]  Future visit scheduled: No  Notes to clinic:  Prescription written on 05/19/18 shows that it was printed. [facility] this [facility] since [facility]/2018    Requested Prescriptions  Pending Prescriptions Disp Refills   LORazepam (ATIVAN) 1 MG tablet 45 tablet 0    Sig: TAKE 1/2 TABLET IN THE MORNING AND 1 TABLET AT BEDTIME     Not Delegated - Psychiatry:  Anxiolytics/Hypnotics Failed - 05/25/2018  4:52 PM      Failed - This refill cannot be delegated      Failed - Urine Drug Screen completed in last 360 days.      Passed - Valid encounter within last 6 months    Recent Outpatient Visits          3 weeks ago Hospital discharge follow-up   [place] [facility], [place], [place]   3 months ago Chronic atrial fibrillation [facility])   [place], [place], DO   6 months ago Paroxysmal atrial fibrillation ([place])   [place] at [facility], Doretha Sou, MD   1 year ago Need for influenza vaccination   [place] at [facility], Doretha Sou, MD   1 year ago Benign essential HTN   [place] at [facility], Doretha Sou, MD

## 2018-05-26 ENCOUNTER — Other Ambulatory Visit: Payer: Self-pay | Admitting: Family Medicine

## 2018-05-26 ENCOUNTER — Other Ambulatory Visit: Payer: Self-pay | Admitting: Physician Assistant

## 2018-05-26 MED ORDER — LORAZEPAM 1 MG PO TABS: ORAL_TABLET | ORAL | 0 refills | Status: DC

## 2018-05-26 NOTE — Telephone Encounter (Signed)
Please advise on refill.

## 2018-06-01 ENCOUNTER — Ambulatory Visit: Payer: Medicare HMO | Admitting: Nurse Practitioner

## 2018-06-02 DIAGNOSIS — H353211 Exudative age-related macular degeneration, right eye, with active choroidal neovascularization: Secondary | ICD-10-CM | POA: Diagnosis not present

## 2018-06-02 DIAGNOSIS — H353231 Exudative age-related macular degeneration, bilateral, with active choroidal neovascularization: Secondary | ICD-10-CM | POA: Diagnosis not present

## 2018-06-02 DIAGNOSIS — H353221 Exudative age-related macular degeneration, left eye, with active choroidal neovascularization: Secondary | ICD-10-CM | POA: Diagnosis not present

## 2018-06-03 ENCOUNTER — Other Ambulatory Visit: Payer: Self-pay | Admitting: *Deleted

## 2018-06-03 MED ORDER — APIXABAN 2.5 MG PO TABS: 2.5000 mg | ORAL_TABLET | Freq: Two times a day (BID) | ORAL | 9 refills | Status: DC

## 2018-06-03 NOTE — Telephone Encounter (Signed)
Pt is a 84 yr old female who saw Dr Caryl Comes on 04/06/18. Weight and SCr on 05/03/18 were 52.7Kg and 0.67 respectively.Do to specified criteria will refill Eliquis 2.5mg  BID.

## 2018-06-09 ENCOUNTER — Other Ambulatory Visit: Payer: Self-pay | Admitting: Internal Medicine

## 2018-06-10 ENCOUNTER — Other Ambulatory Visit: Payer: Self-pay | Admitting: *Deleted

## 2018-06-10 MED ORDER — HYDROCHLOROTHIAZIDE 12.5 MG PO CAPS: 12.5000 mg | ORAL_CAPSULE | Freq: Every day | ORAL | 2 refills | Status: DC

## 2018-07-04 ENCOUNTER — Other Ambulatory Visit: Payer: Self-pay | Admitting: Physician Assistant

## 2018-07-07 DIAGNOSIS — M25552 Pain in left hip: Secondary | ICD-10-CM | POA: Diagnosis not present

## 2018-07-19 ENCOUNTER — Other Ambulatory Visit: Payer: Self-pay

## 2018-07-19 MED ORDER — METOPROLOL SUCCINATE ER 25 MG PO TB24: 25.0000 mg | ORAL_TABLET | Freq: Every day | ORAL | 3 refills | Status: DC

## 2018-07-25 DIAGNOSIS — H35453 Secondary pigmentary degeneration, bilateral: Secondary | ICD-10-CM | POA: Diagnosis not present

## 2018-07-25 DIAGNOSIS — H35363 Drusen (degenerative) of macula, bilateral: Secondary | ICD-10-CM | POA: Diagnosis not present

## 2018-07-25 DIAGNOSIS — H353221 Exudative age-related macular degeneration, left eye, with active choroidal neovascularization: Secondary | ICD-10-CM | POA: Diagnosis not present

## 2018-07-25 DIAGNOSIS — Z961 Presence of intraocular lens: Secondary | ICD-10-CM | POA: Diagnosis not present

## 2018-07-25 DIAGNOSIS — H353211 Exudative age-related macular degeneration, right eye, with active choroidal neovascularization: Secondary | ICD-10-CM | POA: Diagnosis not present

## 2018-07-28 ENCOUNTER — Other Ambulatory Visit: Payer: Self-pay | Admitting: *Deleted

## 2018-07-28 MED ORDER — METOPROLOL SUCCINATE ER 25 MG PO TB24: 25.0000 mg | ORAL_TABLET | Freq: Every day | ORAL | 3 refills | Status: DC

## 2018-08-02 ENCOUNTER — Encounter: Payer: Self-pay | Admitting: Internal Medicine

## 2018-08-02 ENCOUNTER — Ambulatory Visit (INDEPENDENT_AMBULATORY_CARE_PROVIDER_SITE_OTHER): Payer: Medicare HMO | Admitting: Internal Medicine

## 2018-08-02 ENCOUNTER — Other Ambulatory Visit: Payer: Self-pay

## 2018-08-02 VITALS — BP 140/86 | HR 60 | Ht 67.0 in | Wt 124.4 lb

## 2018-08-02 DIAGNOSIS — I4821 Permanent atrial fibrillation: Secondary | ICD-10-CM | POA: Diagnosis not present

## 2018-08-02 DIAGNOSIS — Z950 Presence of cardiac pacemaker: Secondary | ICD-10-CM | POA: Diagnosis not present

## 2018-08-02 DIAGNOSIS — I495 Sick sinus syndrome: Secondary | ICD-10-CM | POA: Diagnosis not present

## 2018-08-02 NOTE — Progress Notes (Signed)
Patient Care Team: Briscoe Deutscher, DO as PCP - General (Family Medicine)   HPI  Megan Olsen is a 85 y.o. female Seen in follow-up for pacemaker implanted 9/19 for tachybradycardia in the setting of permanent atrial fibrillation.  Significantly elevated rates  Started on apixaban.  Significant fatigue which she notes is associated with her metoprolol succinate, both with his initiation and with a couple of intentional interruptions.  Family and friend  are aware of significant improvement in color and energy    Date Cr K Hgb  10/19 0.67 4.4 10.1         Thromboembolic risk factors ( age -59, HTN-1, TIA/CVA-2, DM-1, Gender-1) for a CHADSVASc Score of >= 7  Records and Results Reviewed   Past Medical History:  Diagnosis Date  . Age-related macular degeneration, wet, both eyes ([place])   . Anxiety disorder 07/06/2014  . Arthritis    "all over" (04/27/2018)  . Atrial fibrillation ([place])    catheter ablation of SVT in 2002  . Benign essential HTN 07/06/2014  . Constipation, intermittent 08/04/2007  . CVA (cerebrovascular accident) ([place]) 2003   left brain; denies residual on 04/27/2018  . Depression   . GERD (gastroesophageal reflux disease)   . Heart murmur    hx (04/27/2018)  . Hypothyroidism 09/07/2014   "off RX now" (04/27/2018)  . Iron deficiency anemia   . Iron deficiency anemia, unspecified 01/02/2013  . Osteoarthritis   . Osteoporosis 01/11/2007  . Pneumonia    "one time; years and years ago" (04/27/2018)  . Presence of permanent cardiac pacemaker 04/27/2018    Dual Chamber  . Right BBB/left ant fasc block     Past Surgical History:  Procedure Laterality Date  . APPENDECTOMY    . CATARACT EXTRACTION W/ INTRAOCULAR LENS  IMPLANT, BILATERAL Bilateral   . CHOLECYSTECTOMY OPEN    . COLONOSCOPY    . EYE SURGERY    . FRACTURE SURGERY    . HIP PINNING,CANNULATED Left 07/07/2014   Procedure: CANNULATED HIP PINNING;  Surgeon: Mauri Pole, MD;  [facility];  Service: Orthopedics;  Laterality: Left;  . INSERT / REPLACE / REMOVE PACEMAKER  04/27/2018    Dual Chamber  . JOINT REPLACEMENT    . LAPAROSCOPIC OVARIAN CYSTECTOMY    . NASAL SEPTUM SURGERY    . PACEMAKER IMPLANT N/A 04/27/2018   Procedure: PACEMAKER IMPLANT - Dual Chamber;  Surgeon: Deboraha Sprang, MD;  Location: [place] [facility];  Service: Cardiovascular;  Laterality: N/A;  . SVT ABLATION  2002  . TONSILLECTOMY    . TOTAL ABDOMINAL HYSTERECTOMY    . TOTAL HIP ARTHROPLASTY Right 02/23/2017   Procedure: RIGHT TOTAL HIP ARTHROPLASTY ANTERIOR APPROACH;  Surgeon: Paralee Cancel, MD;  [facility];  Service: Orthopedics;  Laterality: Right;  . VITRECTOMY Right 2008   dr Zadie Rhine.  Vitrectomy and removal of tissue.     Current Meds  Medication Sig  . acetaminophen (TYLENOL) 650 MG CR tablet Take 650-1,300 mg by mouth every 8 (eight) hours as needed for pain.  Marland Kitchen apixaban (ELIQUIS) 2.5 MG TABS tablet Take 1 tablet (2.5 mg total) by mouth 2 (two) times daily.  . Carboxymethylcellulose Sodium (THERATEARS) 0.25 % SOLN Place 1 drop into both eyes 3 (three) times daily as needed (for dry/irritated eyes.).  Marland Kitchen Cholecalciferol (VITAMIN D3) 2000 units TABS Take 2,000 Units by mouth daily.  Marland Kitchen dexlansoprazole (DEXILANT) 60 MG capsule Take 1 capsule (60 mg total) by mouth  daily.  . hydrochlorothiazide (MICROZIDE) 12.5 MG capsule Take 1 capsule (12.5 mg total) by mouth daily.  Marland Kitchen LORazepam (ATIVAN) 1 MG tablet TAKE 1/2 TABLET IN THE MORNING AND 1 TABLET AT BEDTIME  . metoprolol succinate (TOPROL-XL) 25 MG 24 hr tablet Take 1 tablet (25 mg total) by mouth daily.  . Multiple Vitamins-Minerals (PRESERVISION AREDS 2) CAPS Take 1 capsule by mouth 2 (two) times daily.  . NON FORMULARY Place 1 Dose into both eyes every 6 (six) weeks. Receives injections into both eyes every 6 weeks at [facility] office [facility])    Allergies  Allergen Reactions  . Propoxyphene N-Acetaminophen  Nausea Only  . Celecoxib Itching and Rash  . Fexofenadine Rash  . Penicillins Hives and Rash    Has patient had a PCN reaction causing immediate rash, facial/tongue/throat swelling, SOB or lightheadedness with hypotension: Yes Has patient had a PCN reaction causing severe rash involving mucus membranes or skin necrosis: No Has patient had a PCN reaction that required hospitalization: No Has patient had a PCN reaction occurring within the last 10 years: No If all of the above answers are "NO", then may proceed with Cephalosporin use.       Review of Systems negative except from HPI and PMH  Physical Exam BP 140/86   Pulse 60   Ht 5\' 7"  (1.702 m)   Wt 124 lb 6.4 oz (56.4 kg)   SpO2 97%   BMI 19.48 kg/m  Well developed and well nourished in no acute distress HENT normal E scleral and icterus clear Neck Supple JVP flat; carotids brisk and full Clear to ausculation Device pocket well healed; without hematoma or erythema.  There is no tethering *Regular rate and rhythm, no murmurs gallops or rub Soft with active bowel sounds No clubbing cyanosis  Edema Alert and oriented, grossly normal motor and sensory function Skin Warm and Dry  ECG Afib   Vpacing@ 60  Assessment and  Plan  Atrial fibrillation-permanent rapid ventricular response  Bradycardia  Tachycardia and exercise intolerance  Pacemaker St Jude The patient's device was interrogated and the information was fully reviewed.  The device was reprogrammed to maximize longevity, decrease ventricular pacing by decreasing lower rate limit from 60--55 and activating a rest rate of 50.  We will also decrease her ventricular high rate alert to 180 bpm  Fatigue associated with metoprolol succinate  Fatigue is been associate with metoprolol succinate.  We will discontinue it.  I have given her prescription for propranolol LA 60, metoprolol tartrate 50 twice daily and bisoprolol 5.  She will try and find one that she tolerates  better.  If not these will try calcium blocker.  8% of her heartbeats are faster than 100 bpm so rate control is important part of exercise tolerance    Current medicines are reviewed at length with the patient today .  The patient does have concerns regarding medicines As above   We spent more than 50% of our >25 min visit in face to face counseling regarding the above I was somewhat

## 2018-08-02 NOTE — Patient Instructions (Addendum)
Medication Instructions:  You have been given 3 different prescriptions to try. You may take them in any order. Please allow at least 2 weeks on each medication before changing.  DO NOT take more than one of these prescriptions at a time. 1) Metoprolol Tartrate 25 mg twice daily 2) Atenolol 25 mg once daily 3) Bisoprolol 2.5 mg once daily  If you need a refill on your cardiac medications before your next appointment, please call your pharmacy.   Lab work:  If you have labs (blood work) drawn today and your tests are completely normal, you will receive your results only by: Marland Kitchen MyChart Message (if you have MyChart) OR . A paper copy in the mail If you have any lab test that is abnormal or we need to change your treatment, we will call you to review the results.  Testing/Procedures:   Follow-Up: At [facility], you and your health needs are our priority.  As part of our continuing mission to provide you with exceptional heart care, we have created designated Provider Care Teams.  These Care Teams include your primary Cardiologist (physician) and Advanced Practice Providers (APPs -  Physician Assistants and Nurse Practitioners) who all work together to provide you with the care you need, when you need it. You will need a follow up appointment in 9 months.  Please call our office 2 months in advance to schedule this appointment.  You may see one of the following Advanced Practice Providers on your designated Care Team:   Chanetta Marshall, NP . Tommye Standard, PA-C  Any Other Special Instructions Will Be Listed Below (If Applicable)

## 2018-08-03 LAB — CUP PACEART INCLINIC DEVICE CHECK
Battery Voltage: 3.02 V
Brady Statistic RV Percent Paced: 34 %
Implantable Lead Model: 1948
Lead Channel Impedance Value: 637.5 Ohm
Lead Channel Pacing Threshold Amplitude: 0.75 V
Lead Channel Pacing Threshold Pulse Width: 0.5 ms
Lead Channel Setting Pacing Amplitude: 2.5 V
Lead Channel Setting Pacing Pulse Width: 0.5 ms
MDC IDC LEAD IMPLANT DT: 20191009
MDC IDC LEAD LOCATION: 753860
MDC IDC MSMT BATTERY REMAINING LONGEVITY: 123 mo
MDC IDC MSMT LEADCHNL RV PACING THRESHOLD AMPLITUDE: 0.75 V
MDC IDC MSMT LEADCHNL RV PACING THRESHOLD PULSEWIDTH: 0.5 ms
MDC IDC MSMT LEADCHNL RV SENSING INTR AMPL: 11.9 mV
MDC IDC PG IMPLANT DT: 20191009
MDC IDC PG SERIAL: 9067558
MDC IDC SESS DTM: 20200114201309
MDC IDC SET LEADCHNL RV SENSING SENSITIVITY: 2 mV
Pulse Gen Model: 1272

## 2018-08-04 ENCOUNTER — Telehealth: Payer: Self-pay

## 2018-08-04 NOTE — Telephone Encounter (Signed)
Left message for pt re: the 3 beta blocker prescriptions that Dr. Caryl Comes gave her at her OV on 08/02/2018...  She needs to be instructed that he would like her to give each beta blocker that she is trying at the time at least 2 weeks before trying another.. if that particular one is not improving her symptoms.

## 2018-08-04 NOTE — Telephone Encounter (Signed)
Pt advised and verbalized understanding to try each beta blocker for at least 2 weeks and if she is feeling well to stay on that beta blocker and to call and let us know which one so we can update her med list. Pt agrees and will call if she has any further questions.

## 2018-08-15 ENCOUNTER — Other Ambulatory Visit: Payer: Self-pay | Admitting: Family Medicine

## 2018-08-15 NOTE — Telephone Encounter (Signed)
Left message to return call to our office.  Pt needs appointment for follow up.

## 2018-08-15 NOTE — Telephone Encounter (Signed)
Patient scheduled for 02/03- requesting a partial refill of medication until then. Please advise.

## 2018-08-15 NOTE — Telephone Encounter (Signed)
Okay one month refill with appointment.

## 2018-08-15 NOTE — Telephone Encounter (Signed)
See note

## 2018-08-15 NOTE — Telephone Encounter (Signed)
Patient needs an appointment. Will all later to schedule.

## 2018-08-16 NOTE — Telephone Encounter (Signed)
Pt appt scheduled can you please refill?

## 2018-08-21 NOTE — Progress Notes (Signed)
Megan Olsen is a 85 y.o. female is here for follow up.  History of Present Illness:   HPI:   There are no preventive care reminders to display for this patient.   Depression screen [facility] 2/9 08/22/2018 05/25/2018 01/26/2018  Decreased Interest 1 0 0  Down, Depressed, Hopeless 0 0 0  PHQ - 2 Score 1 0 0  Altered sleeping 1 1 0  Tired, decreased energy 1 1 0  Change in appetite 0 0 0  Feeling bad or failure about yourself  0 0 0  Trouble concentrating 0 1 0  Moving slowly or fidgety/restless 0 0 0  Suicidal thoughts 0 0 0  PHQ-9 Score 3 3 0  Difficult doing work/chores Not difficult at all - Not difficult at all   PMHx, SurgHx, SocialHx, FamHx, Medications, and Allergies were reviewed in the Visit Navigator and updated as appropriate.   Patient Active Problem List   Diagnosis Date Noted  . B12 deficiency 08/22/2018  . Facet arthritis, degenerative, cervical spine 08/22/2018  . Tachy-brady syndrome ([place]) 04/27/2018  . Cervical spondylosis without myelopathy 03/11/2018  . Myofascial pain syndrome 03/11/2018  . Cervicalgia 03/11/2018  . Insomnia 01/26/2018  . High risk medication use 01/26/2018  . Macular degeneration of both eyes 01/26/2018  . S/P right THA, AA 02/23/2017  . Hypothyroidism 09/07/2014  . A-fib [facility]), s/p catheter ablation of SVT in 2002 07/06/2014  . History of fracture of left hip 07/06/2014  . Benign essential HTN 07/06/2014  . GERD (gastroesophageal reflux disease) 07/06/2014  . Anxiety disorder 07/06/2014  . History of CVA (cerebrovascular accident), without residual effects 07/06/2014  . Iron deficiency anemia, unspecified 01/02/2013  . Constipation, intermittent 08/04/2007  . Depression 01/11/2007  . Allergic rhinitis 01/11/2007  . Osteoarthritis 01/11/2007  . Osteoporosis 01/11/2007   Social History   Tobacco Use  . Smoking status: Never Smoker  . Smokeless tobacco: Never Used  Substance Use Topics  . Alcohol use: Yes    Comment: 04/27/2018  "a beer q 3 months or so; if that"  . Drug use: Never   Current Medications and Allergies   Current Outpatient Medications:  .  acetaminophen (TYLENOL) 650 MG CR tablet, Take 650-1,300 mg by mouth every 8 (eight) hours as needed for pain., Disp: , Rfl:  .  apixaban (ELIQUIS) 2.5 MG TABS tablet, Take 1 tablet (2.5 mg total) by mouth 2 (two) times daily., Disp: 60 tablet, Rfl: 9 .  bisoprolol (ZEBETA) 5 MG tablet, Take 5 mg by mouth daily., Disp: , Rfl:  .  Carboxymethylcellulose Sodium (THERATEARS) 0.25 % SOLN, Place 1 drop into both eyes 3 (three) times daily as needed (for dry/irritated eyes.)., Disp: , Rfl:  .  Cholecalciferol (VITAMIN D3) 2000 units TABS, Take 2,000 Units by mouth daily., Disp: , Rfl:  .  dexlansoprazole (DEXILANT) 60 MG capsule, Take 1 capsule (60 mg total) by mouth daily., Disp: 90 capsule, Rfl: 3 .  LORazepam (ATIVAN) 1 MG tablet, Take 1 tablet (1 mg total) by mouth at bedtime., Disp: 90 tablet, Rfl: 1 .  Multiple Vitamins-Minerals (PRESERVISION AREDS 2) CAPS, Take 1 capsule by mouth 2 (two) times daily., Disp: 90 capsule, Rfl: 3 .  NON FORMULARY, Place 1 Dose into both eyes every 6 (six) weeks. Receives injections into both eyes every 6 weeks at [facility] office [facility]), Disp: , Rfl:    Allergies  Allergen Reactions  . Propoxyphene N-Acetaminophen Nausea Only  . Celecoxib Itching and Rash  .  Fexofenadine Rash  . Penicillins Hives and Rash    Has patient had a PCN reaction causing immediate rash, facial/tongue/throat swelling, SOB or lightheadedness with hypotension: Yes Has patient had a PCN reaction causing severe rash involving mucus membranes or skin necrosis: No Has patient had a PCN reaction that required hospitalization: No Has patient had a PCN reaction occurring within the last 10 years: No If all of the above answers are "NO", then may proceed with Cephalosporin use.    Review of Systems   Pertinent items are noted in the  HPI. Otherwise, a complete ROS is negative.  Vitals   Vitals:   08/22/18 1106  BP: 138/82  Pulse: 66  Temp: 97.6 F (36.4 C)  TempSrc: Oral  SpO2: 96%  Weight: 123 lb 6.4 oz (56 kg)  Height: 5\' 7"  (1.702 m)     Body mass index is 19.33 kg/m.  Physical Exam   Physical Exam Vitals signs and nursing note reviewed.  HENT:     Head: Normocephalic and atraumatic.  Eyes:     Pupils: Pupils are equal, round, and reactive to light.  Neck:     Musculoskeletal: Normal range of motion and neck supple.  Cardiovascular:     Rate and Rhythm: Normal rate and regular rhythm.  Pulmonary:     Effort: Pulmonary effort is normal.  Abdominal:     Palpations: Abdomen is soft.  Musculoskeletal:     Cervical back: She exhibits decreased range of motion and bony tenderness.     Right lower leg: No edema.     Left lower leg: No edema.  Skin:    General: Skin is warm.  Psychiatric:        Behavior: Behavior normal.     Assessment and Plan   Megan Olsen was seen today for follow-up.  Diagnoses and all orders for this visit:  Benign essential HTN -     Comprehensive metabolic panel  Permanent atrial fibrillation Comments: Followed by Cardiology. Feels much better with change to Bisoprolol.   Cervical spondylosis without myelopathy  Iron deficiency anemia, unspecified iron deficiency anemia type Comments: Did not take iron due to hx of stomach issues from supplement. Discussed iron infusion. She will consider if still very low.  Orders: -     CBC with Differential/Platelet -     Cancel: Iron, TIBC and Ferritin Panel -     Iron, TIBC and Ferritin Panel -     IBC + Ferritin  B12 deficiency Comments: Has been supplementing. Will recheck. Orders: -     CBC with Differential/Platelet -     Vitamin B12  Cervicalgia  Facet arthritis, degenerative, cervical spine Comments: Severe bilateral facet arthritis at C4-5 and C7-T1 and to a lesser degree at C2-3 on the right.Pateint  declines to go back to Dr. Ernestina Patches, saying that she "has too many doctors" even though she had some relief after initial trigger point injections. Will see if I can order IR injection.   Generalized anxiety disorder -     LORazepam (ATIVAN) 1 MG tablet; Take 1 tablet (1 mg total) by mouth at bedtime.   . Orders and follow up as documented in [place], reviewed diet, exercise and weight control, cardiovascular risk and specific lipid/LDL goals reviewed, reviewed medications and side effects in detail.  . Reviewed expectations re: course of current medical issues. . Outlined signs and symptoms indicating need for more acute intervention. . Patient verbalized understanding and all questions were answered. . Patient received an  After Visit Summary.  Briscoe Deutscher, DO [place], [place] 08/22/2018

## 2018-08-22 ENCOUNTER — Ambulatory Visit (INDEPENDENT_AMBULATORY_CARE_PROVIDER_SITE_OTHER): Payer: Medicare HMO | Admitting: Family Medicine

## 2018-08-22 ENCOUNTER — Encounter: Payer: Self-pay | Admitting: Family Medicine

## 2018-08-22 VITALS — BP 138/82 | HR 66 | Temp 97.6°F | Ht 67.0 in | Wt 123.4 lb

## 2018-08-22 DIAGNOSIS — E538 Deficiency of other specified B group vitamins: Secondary | ICD-10-CM | POA: Diagnosis not present

## 2018-08-22 DIAGNOSIS — F411 Generalized anxiety disorder: Secondary | ICD-10-CM | POA: Diagnosis not present

## 2018-08-22 DIAGNOSIS — I4821 Permanent atrial fibrillation: Secondary | ICD-10-CM

## 2018-08-22 DIAGNOSIS — I10 Essential (primary) hypertension: Secondary | ICD-10-CM | POA: Diagnosis not present

## 2018-08-22 DIAGNOSIS — M542 Cervicalgia: Secondary | ICD-10-CM

## 2018-08-22 DIAGNOSIS — M47812 Spondylosis without myelopathy or radiculopathy, cervical region: Secondary | ICD-10-CM | POA: Diagnosis not present

## 2018-08-22 DIAGNOSIS — D509 Iron deficiency anemia, unspecified: Secondary | ICD-10-CM | POA: Diagnosis not present

## 2018-08-22 LAB — CBC WITH DIFFERENTIAL/PLATELET
Basophils Absolute: 0 10*3/uL (ref 0.0–0.1)
Basophils Relative: 0.6 % (ref 0.0–3.0)
Eosinophils Absolute: 0.2 10*3/uL (ref 0.0–0.7)
Eosinophils Relative: 2.7 % (ref 0.0–5.0)
HCT: 34 % — ABNORMAL LOW (ref 36.0–46.0)
Hemoglobin: 10.9 g/dL — ABNORMAL LOW (ref 12.0–15.0)
Lymphocytes Relative: 10.3 % — ABNORMAL LOW (ref 12.0–46.0)
Lymphs Abs: 0.7 10*3/uL (ref 0.7–4.0)
MCHC: 32.1 g/dL (ref 30.0–36.0)
MCV: 78.6 fl (ref 78.0–100.0)
Monocytes Absolute: 0.7 10*3/uL (ref 0.1–1.0)
Monocytes Relative: 10.3 % (ref 3.0–12.0)
Neutro Abs: 4.9 10*3/uL (ref 1.4–7.7)
Neutrophils Relative %: 76.1 % (ref 43.0–77.0)
Platelets: 186 10*3/uL (ref 150.0–400.0)
RBC: 4.33 Mil/uL (ref 3.87–5.11)
RDW: 20.4 % — ABNORMAL HIGH (ref 11.5–15.5)
WBC: 6.5 10*3/uL (ref 4.0–10.5)

## 2018-08-22 LAB — IBC + FERRITIN
Ferritin: 7.2 ng/mL — ABNORMAL LOW (ref 10.0–291.0)
Iron: 32 ug/dL — ABNORMAL LOW (ref 42–145)
Saturation Ratios: 5.9 % — ABNORMAL LOW (ref 20.0–50.0)
Transferrin: 385 mg/dL — ABNORMAL HIGH (ref 212.0–360.0)

## 2018-08-22 LAB — COMPREHENSIVE METABOLIC PANEL
ALT: 15 U/L (ref 0–35)
AST: 19 U/L (ref 0–37)
Albumin: 4 g/dL (ref 3.5–5.2)
Alkaline Phosphatase: 121 U/L — ABNORMAL HIGH (ref 39–117)
BUN: 17 mg/dL (ref 6–23)
CO2: 27 mEq/L (ref 19–32)
Calcium: 9 mg/dL (ref 8.4–10.5)
Chloride: 101 mEq/L (ref 96–112)
Creatinine, Ser: 0.76 mg/dL (ref 0.40–1.20)
GFR: 71.7 mL/min (ref >60.00)
Glucose, Bld: 81 mg/dL (ref 70–99)
Potassium: 4.2 mEq/L (ref 3.5–5.1)
Sodium: 137 mEq/L (ref 135–145)
Total Bilirubin: 0.3 mg/dL (ref 0.2–1.2)
Total Protein: 6.4 g/dL (ref 6.0–8.3)

## 2018-08-22 LAB — VITAMIN B12: Vitamin B-12: 292 pg/mL (ref 211–911)

## 2018-08-22 MED ORDER — LORAZEPAM 1 MG PO TABS: 1.0000 mg | ORAL_TABLET | Freq: Every day | ORAL | 1 refills | Status: DC

## 2018-08-22 NOTE — Patient Instructions (Signed)
It was nice to see you.  I sent in the Lorazepam for 90 day.  I will schedule a neck injection with Interventional Radiology.

## 2018-08-22 NOTE — Addendum Note (Signed)
Addended by: Kayren Eaves T on: 08/22/2018 12:32 PM   Modules accepted: Orders

## 2018-08-23 ENCOUNTER — Other Ambulatory Visit: Payer: Self-pay | Admitting: Family Medicine

## 2018-08-23 DIAGNOSIS — M542 Cervicalgia: Secondary | ICD-10-CM

## 2018-08-23 DIAGNOSIS — M47812 Spondylosis without myelopathy or radiculopathy, cervical region: Secondary | ICD-10-CM

## 2018-08-24 ENCOUNTER — Other Ambulatory Visit: Payer: Self-pay

## 2018-08-24 DIAGNOSIS — E538 Deficiency of other specified B group vitamins: Secondary | ICD-10-CM

## 2018-08-26 ENCOUNTER — Encounter: Payer: Self-pay | Admitting: Hematology and Oncology

## 2018-08-26 ENCOUNTER — Telehealth: Payer: Self-pay | Admitting: Hematology and Oncology

## 2018-08-26 NOTE — Telephone Encounter (Signed)
A new hem appt has been scheduled for the pt to see Dr. Lindi Adie on 2/24 at 1pm. She has agreed to the appt date and time. Letter mailed.

## 2018-08-30 ENCOUNTER — Other Ambulatory Visit: Payer: Self-pay | Admitting: Internal Medicine

## 2018-08-31 ENCOUNTER — Telehealth: Payer: Self-pay | Admitting: Internal Medicine

## 2018-08-31 DIAGNOSIS — H353221 Exudative age-related macular degeneration, left eye, with active choroidal neovascularization: Secondary | ICD-10-CM | POA: Diagnosis not present

## 2018-08-31 NOTE — Telephone Encounter (Signed)
New Message:   Patient calling concerning some medication that she is taking. Please call patient.

## 2018-08-31 NOTE — Telephone Encounter (Signed)
Pt recently seen with Dr. Caryl Comes 08/01/18, please see ov note in regards to trial of medications.

## 2018-08-31 NOTE — Telephone Encounter (Signed)
LVM for return call. 

## 2018-09-06 NOTE — Telephone Encounter (Signed)
Pt calling to state she thinks her bisoprolol, 5mg  qd, is working well for her. Her medication list is updated and she will call with any refill needs. She has no additional needs or questions.

## 2018-09-12 ENCOUNTER — Telehealth: Payer: Self-pay | Admitting: Hematology and Oncology

## 2018-09-12 ENCOUNTER — Inpatient Hospital Stay: Payer: Medicare HMO | Attending: Hematology and Oncology | Admitting: Hematology and Oncology

## 2018-09-12 DIAGNOSIS — M818 Other osteoporosis without current pathological fracture: Secondary | ICD-10-CM | POA: Insufficient documentation

## 2018-09-12 DIAGNOSIS — Z8673 Personal history of transient ischemic attack (TIA), and cerebral infarction without residual deficits: Secondary | ICD-10-CM | POA: Diagnosis not present

## 2018-09-12 DIAGNOSIS — E039 Hypothyroidism, unspecified: Secondary | ICD-10-CM | POA: Diagnosis not present

## 2018-09-12 DIAGNOSIS — D508 Other iron deficiency anemias: Secondary | ICD-10-CM

## 2018-09-12 DIAGNOSIS — D509 Iron deficiency anemia, unspecified: Secondary | ICD-10-CM

## 2018-09-12 DIAGNOSIS — E538 Deficiency of other specified B group vitamins: Secondary | ICD-10-CM | POA: Insufficient documentation

## 2018-09-12 MED ORDER — CYANOCOBALAMIN 1000 MCG/ML IJ SOLN: 1000.0000 ug | Freq: Once | INTRAMUSCULAR | 3 refills | Status: AC

## 2018-09-12 MED ORDER — SYRINGE 2-3 ML 3 ML MISC: 1.0000 | 1 refills | Status: DC

## 2018-09-12 NOTE — Progress Notes (Signed)
[facility] CONSULT NOTE  Patient Care Team: Briscoe Deutscher, DO as PCP - General (Family Medicine)  CHIEF COMPLAINTS/PURPOSE OF CONSULTATION:  Iron deficiency and B12 deficiency anemias  HISTORY OF PRESENTING ILLNESS:  Megan Olsen 85 y.o. female is here because of longstanding history of iron and B12 deficiency anemias.  Patient has had iron replacement therapy and she could not tolerate them.  She has had long history of endoscopies which did not had any blood loss.  She takes oral B12 supplementation and the most recent blood work done by Dr. Juleen China revealed persistent iron deficiency and B12 deficiencies which prompted this evaluation.  She reports to be feeling extremely fatigued and tells me that she is so bad that she wants to give up.  She is accompanied by her daughter today.  Her hemoglobin has been relatively steady at 10.9 with the mild hypochromic picture.  Her iron studies were incredibly low at a ferritin of 7.  I reviewed her records extensively and collaborated the history with the patient.  SUMMARY OF ONCOLOGIC HISTORY:  No history exists.     MEDICAL HISTORY:  Past Medical History:  Diagnosis Date  . Age-related macular degeneration, wet, both eyes ([place])   . Anxiety disorder 07/06/2014  . Arthritis    "all over" (04/27/2018)  . Atrial fibrillation ([place])    catheter ablation of SVT in 2002  . Benign essential HTN 07/06/2014  . Constipation, intermittent 08/04/2007  . CVA (cerebrovascular accident) ([place]) 2003   left brain; denies residual on 04/27/2018  . Depression   . GERD (gastroesophageal reflux disease)   . Heart murmur    hx (04/27/2018)  . Hypothyroidism 09/07/2014   "off RX now" (04/27/2018)  . Iron deficiency anemia   . Iron deficiency anemia, unspecified 01/02/2013  . Osteoarthritis   . Osteoporosis 01/11/2007  . Pneumonia    "one time; years and years ago" (04/27/2018)  . Presence of permanent cardiac pacemaker 04/27/2018    Dual  Chamber  . Right BBB/left ant fasc block     SURGICAL HISTORY: Past Surgical History:  Procedure Laterality Date  . APPENDECTOMY    . CATARACT EXTRACTION W/ INTRAOCULAR LENS  IMPLANT, BILATERAL Bilateral   . CHOLECYSTECTOMY OPEN    . COLONOSCOPY    . EYE SURGERY    . FRACTURE SURGERY    . HIP PINNING,CANNULATED Left 07/07/2014   Procedure: CANNULATED HIP PINNING;  Surgeon: Mauri Pole, MD;  Location: [facility];  Service: Orthopedics;  Laterality: Left;  . INSERT / REPLACE / REMOVE PACEMAKER  04/27/2018    Dual Chamber  . JOINT REPLACEMENT    . LAPAROSCOPIC OVARIAN CYSTECTOMY    . NASAL SEPTUM SURGERY    . PACEMAKER IMPLANT N/A 04/27/2018   Procedure: PACEMAKER IMPLANT - Dual Chamber;  Surgeon: Deboraha Sprang, MD;  Location: [facility];  Service: Cardiovascular;  Laterality: N/A;  . SVT ABLATION  2002  . TONSILLECTOMY    . TOTAL ABDOMINAL HYSTERECTOMY    . TOTAL HIP ARTHROPLASTY Right 02/23/2017   Procedure: RIGHT TOTAL HIP ARTHROPLASTY ANTERIOR APPROACH;  Surgeon: Paralee Cancel, MD;  Location: [facility];  Service: Orthopedics;  Laterality: Right;  . VITRECTOMY Right 2008   dr Zadie Rhine.  Vitrectomy and removal of tissue.     SOCIAL HISTORY: Social History   Socioeconomic History  . Marital status: Single    Spouse name: Not on file  . Number of children: Not on file  . Years of education:  Not on file  . Highest education level: Not on file  Occupational History  . Not on file  Social Needs  . Financial resource strain: Not on file  . Food insecurity:    Worry: Not on file    Inability: Not on file  . Transportation needs:    Medical: Not on file    Non-medical: Not on file  Tobacco Use  . Smoking status: Never Smoker  . Smokeless tobacco: Never Used  Substance and Sexual Activity  . Alcohol use: Yes    Comment: 04/27/2018 "a beer q 3 months or so; if that"  . Drug use: Never  . Sexual activity: Not Currently  Lifestyle  . Physical activity:    Days per week:  Not on file    Minutes per session: Not on file  . Stress: Not on file  Relationships  . Social connections:    Talks on phone: Not on file    Gets together: Not on file    Attends religious service: Not on file    Active member of club or organization: Not on file    Attends meetings of clubs or organizations: Not on file    Relationship status: Not on file  . Intimate partner violence:    Fear of current or ex partner: Not on file    Emotionally abused: Not on file    Physically abused: Not on file    Forced sexual activity: Not on file  Other Topics Concern  . Not on file  Social History Narrative  . Not on file    FAMILY HISTORY: Family History  Problem Relation Age of Onset  . Early death Mother   . Nephritis Mother   . Kidney disease Mother   . Heart attack Father   . Heart disease Sister   . Dementia Sister   . Heart attack Brother   . Dementia Sister     ALLERGIES:  is allergic to propoxyphene n-acetaminophen; celecoxib; fexofenadine; and penicillins.  MEDICATIONS:  Current Outpatient Medications  Medication Sig Dispense Refill  . acetaminophen (TYLENOL) 650 MG CR tablet Take 650-1,300 mg by mouth every 8 (eight) hours as needed for pain.    Marland Kitchen apixaban (ELIQUIS) 2.5 MG TABS tablet Take 1 tablet (2.5 mg total) by mouth 2 (two) times daily. 60 tablet 9  . bisoprolol (ZEBETA) 5 MG tablet Take 5 mg by mouth daily.    . Carboxymethylcellulose Sodium (THERATEARS) 0.25 % SOLN Place 1 drop into both eyes 3 (three) times daily as needed (for dry/irritated eyes.).    Marland Kitchen Cholecalciferol (VITAMIN D3) 2000 units TABS Take 2,000 Units by mouth daily.    . cyanocobalamin (,VITAMIN B-12,) 1000 MCG/ML injection Inject 1 mL (1,000 mcg total) into the muscle once for 1 dose. 10 mL 3  . dexlansoprazole (DEXILANT) 60 MG capsule Take 1 capsule (60 mg total) by mouth daily. 90 capsule 3  . LORazepam (ATIVAN) 1 MG tablet Take 1 tablet (1 mg total) by mouth at bedtime. 90 tablet 1  .  Multiple Vitamins-Minerals (PRESERVISION AREDS 2) CAPS Take 1 capsule by mouth 2 (two) times daily. 90 capsule 3  . NON FORMULARY Place 1 Dose into both eyes every 6 (six) weeks. Receives injections into both eyes every 6 weeks at [employer] office [facility])    . Syringe, Disposable, (2-3CC SYRINGE) 3 ML MISC 1 Package by Does not apply route every 30 (thirty) days. 25 each 1   No current facility-administered medications for  this visit.     REVIEW OF SYSTEMS:   Constitutional: Denies fevers, chills or abnormal night sweats Eyes: Denies blurriness of vision, double vision or watery eyes Ears, nose, mouth, throat, and face: Denies mucositis or sore throat Respiratory: Denies cough, dyspnea or wheezes Cardiovascular: Denies palpitation, chest discomfort or lower extremity swelling Gastrointestinal:  Denies nausea, heartburn or change in bowel habits Skin: Denies abnormal skin rashes Lymphatics: Denies new lymphadenopathy or easy bruising Neurological:Denies numbness, tingling or new weaknesses Behavioral/Psych: Mood is stable, no new changes    All other systems were reviewed with the patient and are negative.  PHYSICAL EXAMINATION: ECOG PERFORMANCE STATUS: 2 - Symptomatic, <50% confined to bed  Vitals:   09/12/18 1257  BP: (!) 192/60  Pulse: (!) 55  Resp: 20  Temp: 97.6 F (36.4 C)  SpO2: 98%   Filed Weights   09/12/18 1257  Weight: 122 lb 12.8 oz (55.7 kg)    GENERAL:alert, no distress and comfortable SKIN: skin color, texture, turgor are normal, no rashes or significant lesions EYES: normal, conjunctiva are pink and non-injected, sclera clear OROPHARYNX:no exudate, no erythema and lips, buccal mucosa, and tongue normal  NECK: supple, thyroid normal size, non-tender, without nodularity LYMPH:  no palpable lymphadenopathy in the cervical, axillary or inguinal LUNGS: clear to auscultation and percussion with normal breathing effort HEART: regular  rate & rhythm and no murmurs and no lower extremity edema ABDOMEN:abdomen soft, non-tender and normal bowel sounds Musculoskeletal:no cyanosis of digits and no clubbing  PSYCH: alert & oriented x 3 with fluent speech NEURO: no focal motor/sensory deficits   LABORATORY DATA:  I have reviewed the data as listed Lab Results  Component Value Date   WBC 6.5 08/22/2018   HGB 10.9 (L) 08/22/2018   HCT 34.0 (L) 08/22/2018   MCV 78.6 08/22/2018   PLT 186.0 08/22/2018   Lab Results  Component Value Date   NA 137 08/22/2018   K 4.2 08/22/2018   CL 101 08/22/2018   CO2 27 08/22/2018    RADIOGRAPHIC STUDIES: I have personally reviewed the radiological reports and agreed with the findings in the report.  ASSESSMENT AND PLAN:  Iron deficiency anemia and B12 deficiency Iron deficiency anemia with intolerance to oral iron therapy Labs 08/22/2018 by Dr. Briscoe Deutscher: B12: 292 Iron studies: Ferritin 7.2, iron saturation 5.9% Hemoglobin 10.9, MCV 78.6, RDW 20.4  Based on the above results the patient's anemia is most likely iron deficiency related. We discussed the mechanism of absorption of B12 and suggested that she start B12 replacement therapy to injections.  We will give her 2 injections along with her IV iron treatments.  After that she can get once a month B12 injections at home.  She has a friend who is a retired Marine scientist and would be willing to give her those injections.  I sent a prescription to her pharmacy.   We discussed the differential diagnosis of iron deficiency including blood loss versus malabsorption.  Recommendation: IV iron therapy We discussed that some patients may have a reaction to IV iron. We also discussed that patient might need IV iron treatments in the future if she were to run low.  The frequency will depend on the cause of iron deficiency. I also discussed GI evaluation.  Return to clinic in 3 months with labs and follow-up.   All questions were answered. The  patient knows to call the clinic with any problems, questions or concerns.    Harriette Ohara, MD 09/12/18

## 2018-09-12 NOTE — Assessment & Plan Note (Signed)
Iron deficiency anemia with intolerance to oral iron therapy Labs 08/22/2018 by Dr. Briscoe Deutscher: B12: 292 Iron studies: Ferritin 7.2, iron saturation 5.9% Hemoglobin 10.9, MCV 78.6, RDW 20.4  Based on the above results the patient's anemia is most likely iron deficiency related. Although the B12 levels are low, they are not low enough to require injection therapy. We discussed the differential diagnosis of iron deficiency including blood loss versus malabsorption.  Recommendation: IV iron therapy We discussed that some patients may have a reaction to IV iron. We also discussed that patient might need IV iron treatments in the future if she were to run low.  The frequency will depend on the cause of iron deficiency. I recommended that the patient get GI evaluation.  Return to clinic in 3 months with labs and follow-up.

## 2018-09-12 NOTE — Telephone Encounter (Signed)
Gave avs and calendar ° °

## 2018-09-13 ENCOUNTER — Ambulatory Visit: Payer: Medicare HMO | Admitting: Nurse Practitioner

## 2018-09-13 ENCOUNTER — Encounter: Payer: Self-pay | Admitting: Nurse Practitioner

## 2018-09-13 VITALS — BP 140/72 | HR 56 | Ht 64.0 in | Wt 123.0 lb

## 2018-09-13 DIAGNOSIS — Z7901 Long term (current) use of anticoagulants: Secondary | ICD-10-CM | POA: Diagnosis not present

## 2018-09-13 DIAGNOSIS — Z950 Presence of cardiac pacemaker: Secondary | ICD-10-CM | POA: Diagnosis not present

## 2018-09-13 DIAGNOSIS — I4821 Permanent atrial fibrillation: Secondary | ICD-10-CM | POA: Diagnosis not present

## 2018-09-13 NOTE — Progress Notes (Signed)
CARDIOLOGY OFFICE NOTE  Date:  09/13/2018    Megan Olsen Date of Birth: 1927-10-09 Medical Record #[id number]  PCP:  Briscoe Deutscher, DO  Cardiologist:  [facility]    Chief Complaint  Patient presents with  . Follow-up    Seen for Dr. Marlou Porch    History of Present Illness: Megan Olsen is a 85 y.o. female who presents today for a follow up visit. Seen for Dr. Hampton Abbot.   She has had a history of chronic AF, bradycardia, moderate MR from echo dating back to 2012 with normal EF, remote SVT ablation in 2002. Previously on coumadin. Former CVA.    Last seen by Dr. Marlou Porch in August - she was felt to be doing ok - declined anticoagulation despite elevated CHADSVASC. She was noted to have marked bradycardia - beta blocker was stopped - monitor obtained and she was subsequently referred for PPM implant by Dr. Caryl Comes in October of 2019.   Comes in today. Here with her friend/neighbor [name]. She is doing well. No chest pain. Breathing is ok - does get a little short with over exertion. Has had some fatigue - will be starting B12 and iron infusions next week. She has not had any evidence of bleeding. BP was up with hematology yesterday -she admits she was very anxious and had not slept the night prior to that visit. BP is fine here today. Overall she feels like she is doing well.   Past Medical History:  Diagnosis Date  . Age-related macular degeneration, wet, both eyes ([place])   . Anxiety disorder 07/06/2014  . Arthritis    "all over" (04/27/2018)  . Atrial fibrillation ([place])    catheter ablation of SVT in 2002  . Benign essential HTN 07/06/2014  . Constipation, intermittent 08/04/2007  . CVA (cerebrovascular accident) ([place]) 2003   left brain; denies residual on 04/27/2018  . Depression   . GERD (gastroesophageal reflux disease)   . Heart murmur    hx (04/27/2018)  . Hypothyroidism 09/07/2014   "off RX now" (04/27/2018)  . Iron deficiency anemia   . Iron  deficiency anemia, unspecified 01/02/2013  . Osteoarthritis   . Osteoporosis 01/11/2007  . Pneumonia    "one time; years and years ago" (04/27/2018)  . Presence of permanent cardiac pacemaker 04/27/2018    Dual Chamber  . Right BBB/left ant fasc block     Past Surgical History:  Procedure Laterality Date  . APPENDECTOMY    . CATARACT EXTRACTION W/ INTRAOCULAR LENS  IMPLANT, BILATERAL Bilateral   . CHOLECYSTECTOMY OPEN    . COLONOSCOPY    . EYE SURGERY    . FRACTURE SURGERY    . HIP PINNING,CANNULATED Left 07/07/2014   Procedure: CANNULATED HIP PINNING;  Surgeon: Mauri Pole, MD;  Location: [facility];  Service: Orthopedics;  Laterality: Left;  . INSERT / REPLACE / REMOVE PACEMAKER  04/27/2018    Dual Chamber  . JOINT REPLACEMENT    . LAPAROSCOPIC OVARIAN CYSTECTOMY    . NASAL SEPTUM SURGERY    . PACEMAKER IMPLANT N/A 04/27/2018   Procedure: PACEMAKER IMPLANT - Dual Chamber;  Surgeon: Deboraha Sprang, MD;  Location: [facility];  Service: Cardiovascular;  Laterality: N/A;  . SVT ABLATION  2002  . TONSILLECTOMY    . TOTAL ABDOMINAL HYSTERECTOMY    . TOTAL HIP ARTHROPLASTY Right 02/23/2017   Procedure: RIGHT TOTAL HIP ARTHROPLASTY ANTERIOR APPROACH;  Surgeon: Paralee Cancel, MD;  Location: [facility];  Service: Orthopedics;  Laterality: Right;  . VITRECTOMY Right 2008   dr Zadie Rhine.  Vitrectomy and removal of tissue.      Medications: Current Meds  Medication Sig  . acetaminophen (TYLENOL) 650 MG CR tablet Take 650-1,300 mg by mouth every 8 (eight) hours as needed for pain.  Marland Kitchen apixaban (ELIQUIS) 2.5 MG TABS tablet Take 1 tablet (2.5 mg total) by mouth 2 (two) times daily.  . bisoprolol (ZEBETA) 5 MG tablet Take 5 mg by mouth daily.  . Carboxymethylcellulose Sodium (THERATEARS) 0.25 % SOLN Place 1 drop into both eyes 3 (three) times daily as needed (for dry/irritated eyes.).  Marland Kitchen Cholecalciferol (VITAMIN D3) 2000 units TABS Take 2,000 Units by mouth daily.  Marland Kitchen dexlansoprazole  (DEXILANT) 60 MG capsule Take 1 capsule (60 mg total) by mouth daily.  Marland Kitchen LORazepam (ATIVAN) 1 MG tablet Take 1 tablet (1 mg total) by mouth at bedtime.  . Multiple Vitamins-Minerals (PRESERVISION AREDS 2) CAPS Take 1 capsule by mouth 2 (two) times daily.  . NON FORMULARY Place 1 Dose into both eyes every 6 (six) weeks. Receives injections into both eyes every 6 weeks at [facility])  . Syringe, Disposable, (2-3CC SYRINGE) 3 ML MISC 1 Package by Does not apply route every 30 (thirty) days.     Allergies: Allergies  Allergen Reactions  . Propoxyphene N-Acetaminophen Nausea Only  . Celecoxib Itching and Rash  . Fexofenadine Rash  . Penicillins Hives and Rash    Has patient had a PCN reaction causing immediate rash, facial/tongue/throat swelling, SOB or lightheadedness with hypotension: Yes Has patient had a PCN reaction causing severe rash involving mucus membranes or skin necrosis: No Has patient had a PCN reaction that required hospitalization: No Has patient had a PCN reaction occurring within the last 10 years: No If all of the above answers are "NO", then may proceed with Cephalosporin use.     Social History: The patient  reports that she has never smoked. She has never used smokeless tobacco. She reports current alcohol use. She reports that she does not use drugs.   Family History: The patient's family history includes Dementia in her sister and sister; Early death in her mother; Heart attack in her brother and father; Heart disease in her sister; Kidney disease in her mother; Nephritis in her mother.   Review of Systems: Please see the history of present illness.   Otherwise, the review of systems is positive for none.   All other systems are reviewed and negative.   Physical Exam: VS:  BP 140/72 (BP Location: Left Arm, Patient Position: Sitting, Cuff Size: Small)   Pulse (!) 56   Ht 5\' 4"  (1.626 m)   Wt 123 lb (55.8 kg)   SpO2 97%  Comment: at rest  BMI 21.11 kg/m  .  BMI Body mass index is 21.11 kg/m.  Wt Readings from Last 3 Encounters:  09/13/18 123 lb (55.8 kg)  09/12/18 122 lb 12.8 oz (55.7 kg)  08/22/18 123 lb 6.4 oz (56 kg)    General: Pleasant. Elderly. She looks younger than her stated age. She is alert and in no acute distress.   HEENT: Normal.  Neck: Supple, no JVD, carotid bruits, or masses noted.  Cardiac: Irregular rhythm. No edema.  Respiratory:  Lungs are clear to auscultation bilaterally with normal work of breathing.  GI: Soft and nontender.  MS: No deformity or atrophy. Gait and ROM intact.  Skin: Warm and dry. Color is normal.  Neuro:  Strength and sensation are intact and no gross focal deficits noted.  Psych: Alert, appropriate and with normal affect.   LABORATORY DATA:  EKG:  EKG is not ordered today.  Lab Results  Component Value Date   WBC 6.5 08/22/2018   HGB 10.9 (L) 08/22/2018   HCT 34.0 (L) 08/22/2018   PLT 186.0 08/22/2018   GLUCOSE 81 08/22/2018   CHOL 161 08/28/2015   TRIG 82.0 08/28/2015   HDL 70.40 08/28/2015   LDLCALC 74 08/28/2015   ALT 15 08/22/2018   AST 19 08/22/2018   NA 137 08/22/2018   K 4.2 08/22/2018   CL 101 08/22/2018   CREATININE 0.76 08/22/2018   BUN 17 08/22/2018   CO2 27 08/22/2018   TSH 4.83 (H) 01/26/2018   INR 0.92 01/02/2013     BNP (last 3 results) No results for input(s): BNP in the last 8760 hours.  ProBNP (last 3 results) No results for input(s): PROBNP in the last 8760 hours.   Other Studies Reviewed Today:   Assessment/Plan:  1. Persistent AF - managed with PPM, rate control and anticoagulation.   2. Tachybrady syndrome with underlying PPM -  Followed by EP.   3. Prior stroke  4. Elevated CHADSVASC - she is on anticoagulation - lower dose of Eliquis.   5. HTN - BP ok here today. No changes made.  6. Anemia - no evidence of active bleeding - was referred to hematology - starting IV iron.    Current medicines  are reviewed with the patient today.  The patient does not have concerns regarding medicines other than what has been noted above.  The following changes have been made:  See above.  Labs/ tests ordered today include:   No orders of the defined types were placed in this encounter.    Disposition:   FU with Dr. Marlou Porch in 6 months. Seeing EP in October. I am happy to see back as needed.   Patient is agreeable to this plan and will call if any problems develop in the interim.   SignedTruitt Merle, NP  09/13/2018 1:55 PM  [facility] [address] [place], [place]  [address] Phone: [phone] Fax: [phone]

## 2018-09-13 NOTE — Patient Instructions (Addendum)
We will be checking the following labs today - NONE   Medication Instructions:    Continue with your current medicines.    If you need a refill on your cardiac medications before your next appointment, please call your pharmacy.     Testing/Procedures To Be Arranged:  N/A  Follow-Up:   See Dr. Marlou Porch in August    At [facility], you and your health needs are our priority.  As part of our continuing mission to provide you with exceptional heart care, we have created designated Provider Care Teams.  These Care Teams include your primary Cardiologist (physician) and Advanced Practice Providers (APPs -  Physician Assistants and Nurse Practitioners) who all work together to provide you with the care you need, when you need it.  Special Instructions:  . None  Call the [place] office at [phone] if you have any questions, problems or concerns.

## 2018-09-19 ENCOUNTER — Inpatient Hospital Stay: Payer: Medicare HMO | Attending: Hematology and Oncology

## 2018-09-19 VITALS — BP 161/70 | HR 55 | Resp 18

## 2018-09-19 DIAGNOSIS — D508 Other iron deficiency anemias: Secondary | ICD-10-CM

## 2018-09-19 DIAGNOSIS — E538 Deficiency of other specified B group vitamins: Secondary | ICD-10-CM | POA: Insufficient documentation

## 2018-09-19 DIAGNOSIS — D509 Iron deficiency anemia, unspecified: Secondary | ICD-10-CM | POA: Insufficient documentation

## 2018-09-19 MED ORDER — SODIUM CHLORIDE 0.9 % IV SOLN
Freq: Once | INTRAVENOUS | Status: AC
  Administered 2018-09-19: 08:00:00 via INTRAVENOUS
  Filled 2018-09-19: qty 250

## 2018-09-19 MED ORDER — SODIUM CHLORIDE 0.9% FLUSH
10.0000 mL | Freq: Once | INTRAVENOUS | Status: DC | PRN
  Filled 2018-09-19: qty 10

## 2018-09-19 MED ORDER — CYANOCOBALAMIN 1000 MCG/ML IJ SOLN
1000.0000 ug | Freq: Once | INTRAMUSCULAR | Status: AC
  Administered 2018-09-19: 1000 ug via INTRAMUSCULAR

## 2018-09-19 MED ORDER — CYANOCOBALAMIN 1000 MCG/ML IJ SOLN
INTRAMUSCULAR | Status: AC
  Filled 2018-09-19: qty 1

## 2018-09-19 MED ORDER — SODIUM CHLORIDE 0.9 % IV SOLN
510.0000 mg | Freq: Once | INTRAVENOUS | Status: AC
  Administered 2018-09-19: 510 mg via INTRAVENOUS
  Filled 2018-09-19: qty 17

## 2018-09-19 NOTE — Patient Instructions (Signed)
Ferumoxytol injection What is this medicine? FERUMOXYTOL is an iron complex. Iron is used to make healthy red blood cells, which carry oxygen and nutrients throughout the body. This medicine is used to treat iron deficiency anemia. This medicine may be used for other purposes; ask your health care provider or pharmacist if you have questions. COMMON BRAND NAME(S): Feraheme What should I tell my health care provider before I take this medicine? They need to know if you have any of these conditions: -anemia not caused by low iron levels -high levels of iron in the blood -magnetic resonance imaging (MRI) test scheduled -an unusual or allergic reaction to iron, other medicines, foods, dyes, or preservatives -pregnant or trying to get pregnant -breast-feeding How should I use this medicine? This medicine is for injection into a vein. It is given by a health care professional in a hospital or clinic setting. Talk to your pediatrician regarding the use of this medicine in children. Special care may be needed. Overdosage: If you think you have taken too much of this medicine contact a poison control center or emergency room at once. NOTE: This medicine is only for you. Do not share this medicine with others. What if I miss a dose? It is important not to miss your dose. Call your doctor or health care professional if you are unable to keep an appointment. What may interact with this medicine? This medicine may interact with the following medications: -other iron products This list may not describe all possible interactions. Give your health care provider a list of all the medicines, herbs, non-prescription drugs, or dietary supplements you use. Also tell them if you smoke, drink alcohol, or use illegal drugs. Some items may interact with your medicine. What should I watch for while using this medicine? Visit your doctor or healthcare professional regularly. Tell your doctor or healthcare professional  if your symptoms do not start to get better or if they get worse. You may need blood work done while you are taking this medicine. You may need to follow a special diet. Talk to your doctor. Foods that contain iron include: whole grains/cereals, dried fruits, beans, or peas, leafy green vegetables, and organ meats (liver, kidney). What side effects may I notice from receiving this medicine? Side effects that you should report to your doctor or health care professional as soon as possible: -allergic reactions like skin rash, itching or hives, swelling of the face, lips, or tongue -breathing problems -changes in blood pressure -feeling faint or lightheaded, falls -fever or chills -flushing, sweating, or hot feelings -swelling of the ankles or feet Side effects that usually do not require medical attention (report to your doctor or health care professional if they continue or are bothersome): -diarrhea -headache -nausea, vomiting -stomach pain This list may not describe all possible side effects. Call your doctor for medical advice about side effects. You may report side effects to FDA at 1-800-FDA-1088. Where should I keep my medicine? This drug is given in a hospital or clinic and will not be stored at home. NOTE: This sheet is a summary. It may not cover all possible information. If you have questions about this medicine, talk to your doctor, pharmacist, or health care provider.  2019 Elsevier/Gold Standard (2016-08-24 20:21:10)  Cyanocobalamin, Vitamin B12 injection What is this medicine? CYANOCOBALAMIN (sye an oh koe BAL a min) is a man made form of vitamin B12. Vitamin B12 is used in the growth of healthy blood cells, nerve cells, and proteins in the  body. It also helps with the metabolism of fats and carbohydrates. This medicine is used to treat people who can not absorb vitamin B12. This medicine may be used for other purposes; ask your health care provider or pharmacist if you have  questions. COMMON BRAND NAME(S): B-12 Compliance Kit, B-12 Injection Kit, Cyomin, LA-12, Nutri-Twelve, Physicians EZ Use B-12, Primabalt What should I tell my health care provider before I take this medicine? They need to know if you have any of these conditions: -kidney disease -Leber's disease -megaloblastic anemia -an unusual or allergic reaction to cyanocobalamin, cobalt, other medicines, foods, dyes, or preservatives -pregnant or trying to get pregnant -breast-feeding How should I use this medicine? This medicine is injected into a muscle or deeply under the skin. It is usually given by a health care professional in a clinic or doctor's office. However, your doctor may teach you how to inject yourself. Follow all instructions. Talk to your pediatrician regarding the use of this medicine in children. Special care may be needed. Overdosage: If you think you have taken too much of this medicine contact a poison control center or emergency room at once. NOTE: This medicine is only for you. Do not share this medicine with others. What if I miss a dose? If you are given your dose at a clinic or doctor's office, call to reschedule your appointment. If you give your own injections and you miss a dose, take it as soon as you can. If it is almost time for your next dose, take only that dose. Do not take double or extra doses. What may interact with this medicine? -colchicine -heavy alcohol intake This list may not describe all possible interactions. Give your health care provider a list of all the medicines, herbs, non-prescription drugs, or dietary supplements you use. Also tell them if you smoke, drink alcohol, or use illegal drugs. Some items may interact with your medicine. What should I watch for while using this medicine? Visit your doctor or health care professional regularly. You may need blood work done while you are taking this medicine. You may need to follow a special diet. Talk to your  doctor. Limit your alcohol intake and avoid smoking to get the best benefit. What side effects may I notice from receiving this medicine? Side effects that you should report to your doctor or health care professional as soon as possible: -allergic reactions like skin rash, itching or hives, swelling of the face, lips, or tongue -blue tint to skin -chest tightness, pain -difficulty breathing, wheezing -dizziness -red, swollen painful area on the leg Side effects that usually do not require medical attention (report to your doctor or health care professional if they continue or are bothersome): -diarrhea -headache This list may not describe all possible side effects. Call your doctor for medical advice about side effects. You may report side effects to FDA at 1-800-FDA-1088. Where should I keep my medicine? Keep out of the reach of children. Store at room temperature between 15 and 30 degrees C (59 and 85 degrees F). Protect from light. Throw away any unused medicine after the expiration date. NOTE: This sheet is a summary. It may not cover all possible information. If you have questions about this medicine, talk to your doctor, pharmacist, or health care provider.  2019 Elsevier/Gold Standard (2007-10-17 22:10:20)

## 2018-09-26 ENCOUNTER — Inpatient Hospital Stay: Payer: Medicare HMO

## 2018-09-26 VITALS — BP 168/65 | HR 50 | Temp 97.7°F | Resp 16

## 2018-09-26 DIAGNOSIS — D508 Other iron deficiency anemias: Secondary | ICD-10-CM

## 2018-09-26 DIAGNOSIS — D509 Iron deficiency anemia, unspecified: Secondary | ICD-10-CM | POA: Diagnosis not present

## 2018-09-26 DIAGNOSIS — E538 Deficiency of other specified B group vitamins: Secondary | ICD-10-CM | POA: Diagnosis not present

## 2018-09-26 MED ORDER — ALTEPLASE 2 MG IJ SOLR
2.0000 mg | Freq: Once | INTRAMUSCULAR | Status: DC | PRN
  Filled 2018-09-26: qty 2

## 2018-09-26 MED ORDER — SODIUM CHLORIDE 0.9 % IV SOLN
510.0000 mg | Freq: Once | INTRAVENOUS | Status: AC
  Administered 2018-09-26: 510 mg via INTRAVENOUS
  Filled 2018-09-26: qty 17

## 2018-09-26 MED ORDER — SODIUM CHLORIDE 0.9% FLUSH
10.0000 mL | Freq: Once | INTRAVENOUS | Status: DC | PRN
  Filled 2018-09-26: qty 10

## 2018-09-26 MED ORDER — SODIUM CHLORIDE 0.9 % IV SOLN
Freq: Once | INTRAVENOUS | Status: AC
  Administered 2018-09-26: 14:00:00 via INTRAVENOUS
  Filled 2018-09-26: qty 250

## 2018-09-26 MED ORDER — HEPARIN SOD (PORK) LOCK FLUSH 100 UNIT/ML IV SOLN
250.0000 [IU] | Freq: Once | INTRAVENOUS | Status: DC | PRN
  Filled 2018-09-26: qty 5

## 2018-09-26 MED ORDER — SODIUM CHLORIDE 0.9% FLUSH
3.0000 mL | Freq: Once | INTRAVENOUS | Status: DC | PRN
  Filled 2018-09-26: qty 10

## 2018-09-26 MED ORDER — HEPARIN SOD (PORK) LOCK FLUSH 100 UNIT/ML IV SOLN
500.0000 [IU] | Freq: Once | INTRAVENOUS | Status: DC | PRN
  Filled 2018-09-26: qty 5

## 2018-09-26 NOTE — Patient Instructions (Signed)
Ferumoxytol injection What is this medicine? FERUMOXYTOL is an iron complex. Iron is used to make healthy red blood cells, which carry oxygen and nutrients throughout the body. This medicine is used to treat iron deficiency anemia. This medicine may be used for other purposes; ask your health care provider or pharmacist if you have questions. COMMON BRAND NAME(S): Feraheme What should I tell my health care provider before I take this medicine? They need to know if you have any of these conditions: -anemia not caused by low iron levels -high levels of iron in the blood -magnetic resonance imaging (MRI) test scheduled -an unusual or allergic reaction to iron, other medicines, foods, dyes, or preservatives -pregnant or trying to get pregnant -breast-feeding How should I use this medicine? This medicine is for injection into a vein. It is given by a health care professional in a hospital or clinic setting. Talk to your pediatrician regarding the use of this medicine in children. Special care may be needed. Overdosage: If you think you have taken too much of this medicine contact a poison control center or emergency room at once. NOTE: This medicine is only for you. Do not share this medicine with others. What if I miss a dose? It is important not to miss your dose. Call your doctor or health care professional if you are unable to keep an appointment. What may interact with this medicine? This medicine may interact with the following medications: -other iron products This list may not describe all possible interactions. Give your health care provider a list of all the medicines, herbs, non-prescription drugs, or dietary supplements you use. Also tell them if you smoke, drink alcohol, or use illegal drugs. Some items may interact with your medicine. What should I watch for while using this medicine? Visit your doctor or healthcare professional regularly. Tell your doctor or healthcare professional  if your symptoms do not start to get better or if they get worse. You may need blood work done while you are taking this medicine. You may need to follow a special diet. Talk to your doctor. Foods that contain iron include: whole grains/cereals, dried fruits, beans, or peas, leafy green vegetables, and organ meats (liver, kidney). What side effects may I notice from receiving this medicine? Side effects that you should report to your doctor or health care professional as soon as possible: -allergic reactions like skin rash, itching or hives, swelling of the face, lips, or tongue -breathing problems -changes in blood pressure -feeling faint or lightheaded, falls -fever or chills -flushing, sweating, or hot feelings -swelling of the ankles or feet Side effects that usually do not require medical attention (report to your doctor or health care professional if they continue or are bothersome): -diarrhea -headache -nausea, vomiting -stomach pain This list may not describe all possible side effects. Call your doctor for medical advice about side effects. You may report side effects to FDA at 1-800-FDA-1088. Where should I keep my medicine? This drug is given in a hospital or clinic and will not be stored at home. NOTE: This sheet is a summary. It may not cover all possible information. If you have questions about this medicine, talk to your doctor, pharmacist, or health care provider.  2019 Elsevier/Gold Standard (2016-08-24 20:21:10) Cyanocobalamin, Vitamin B12 injection What is this medicine? CYANOCOBALAMIN (sye an oh koe BAL a min) is a man made form of vitamin B12. Vitamin B12 is used in the growth of healthy blood cells, nerve cells, and proteins in the body.   body. It also helps with the metabolism of fats and carbohydrates. This medicine is used to treat people who can not absorb vitamin B12. This medicine may be used for other purposes; ask your health care provider or pharmacist if you have  questions. COMMON BRAND NAME(S): B-12 Compliance Kit, B-12 Injection Kit, Cyomin, LA-12, Nutri-Twelve, Physicians EZ Use B-12, Primabalt What should I tell my health care provider before I take this medicine? They need to know if you have any of these conditions: -kidney disease -Leber's disease -megaloblastic anemia -an unusual or allergic reaction to cyanocobalamin, cobalt, other medicines, foods, dyes, or preservatives -pregnant or trying to get pregnant -breast-feeding How should I use this medicine? This medicine is injected into a muscle or deeply under the skin. It is usually given by a health care professional in a clinic or doctor's office. However, your doctor may teach you how to inject yourself. Follow all instructions. Talk to your pediatrician regarding the use of this medicine in children. Special care may be needed. Overdosage: If you think you have taken too much of this medicine contact a poison control center or emergency room at once. NOTE: This medicine is only for you. Do not share this medicine with others. What if I miss a dose? If you are given your dose at a clinic or doctor's office, call to reschedule your appointment. If you give your own injections and you miss a dose, take it as soon as you can. If it is almost time for your next dose, take only that dose. Do not take double or extra doses. What may interact with this medicine? -colchicine -heavy alcohol intake This list may not describe all possible interactions. Give your health care provider a list of all the medicines, herbs, non-prescription drugs, or dietary supplements you use. Also tell them if you smoke, drink alcohol, or use illegal drugs. Some items may interact with your medicine. What should I watch for while using this medicine? Visit your doctor or health care professional regularly. You may need blood work done while you are taking this medicine. You may need to follow a special diet. Talk to your  doctor. Limit your alcohol intake and avoid smoking to get the best benefit. What side effects may I notice from receiving this medicine? Side effects that you should report to your doctor or health care professional as soon as possible: -allergic reactions like skin rash, itching or hives, swelling of the face, lips, or tongue -blue tint to skin -chest tightness, pain -difficulty breathing, wheezing -dizziness -red, swollen painful area on the leg Side effects that usually do not require medical attention (report to your doctor or health care professional if they continue or are bothersome): -diarrhea -headache This list may not describe all possible side effects. Call your doctor for medical advice about side effects. You may report side effects to FDA at 1-800-FDA-1088. Where should I keep my medicine? Keep out of the reach of children. Store at room temperature between 15 and 30 degrees C (59 and 85 degrees F). Protect from light. Throw away any unused medicine after the expiration date. NOTE: This sheet is a summary. It may not cover all possible information. If you have questions about this medicine, talk to your doctor, pharmacist, or health care provider.  2019 Elsevier/Gold Standard (2007-10-17 22:10:20)

## 2018-09-27 ENCOUNTER — Telehealth: Payer: Self-pay | Admitting: Family Medicine

## 2018-09-27 NOTE — Telephone Encounter (Signed)
Please advise 

## 2018-09-27 NOTE — Telephone Encounter (Signed)
Copied from [place] [id number]. Topic: General - Other >> Sep 27, 2018 12:23 PM Bea Graff, NT wrote: Reason for CRM: Pt states she received a B12 injection yesterday when she got her iron infusion and wants to see if she is to come in the office in 1 month to get another B12 injection. Please advise.

## 2018-09-28 NOTE — Telephone Encounter (Signed)
She is now followed by Hematology, but okay her monthly if easier for patient.

## 2018-09-29 ENCOUNTER — Telehealth: Payer: Self-pay | Admitting: Nurse Practitioner

## 2018-09-29 DIAGNOSIS — I10 Essential (primary) hypertension: Secondary | ICD-10-CM

## 2018-09-29 DIAGNOSIS — I4821 Permanent atrial fibrillation: Secondary | ICD-10-CM

## 2018-09-29 MED ORDER — BISOPROLOL FUMARATE 5 MG PO TABS: 5.0000 mg | ORAL_TABLET | Freq: Every day | ORAL | 3 refills | Status: DC

## 2018-09-29 NOTE — Telephone Encounter (Signed)
° ° °  Pt c/o medication issue:  1. Name of Medication: bisoprolol (ZEBETA) 5 MG tablet versus Metoprolol  2. How are you currently taking this medication (dosage and times per day)? bisoprolol (ZEBETA) 5 MG tablet as writen  3. Are you having a reaction (difficulty breathing--STAT)?   4. What is your medication issue? Patient states she received metoprolol from [facility], however she only wants to take Bisoprolol. She wants to confirm she is taking the correct medication

## 2018-09-29 NOTE — Telephone Encounter (Signed)
Per pt is tolerating the Bisoprolol 5 mg daily does not wish to try other meds refill for Bisoprolol sent to [facility] per pt is going to try and see if [employer] will take back the Metoprolol .

## 2018-09-29 NOTE — Telephone Encounter (Signed)
Called patient easier for her to get to our office she will be due in April. App made with patient for that.

## 2018-09-29 NOTE — Telephone Encounter (Signed)
° ° ° ° ° ° °  1. Which medications need to be refilled? (please list name of each medication and dose if known) bisoprolol (ZEBETA) 5 MG tablet  2. Which pharmacy/location (including street and city if local pharmacy) is medication to be sent to? [place], [place]  3. Do they need a 30 day or 90 day supply? [place]

## 2018-10-03 DIAGNOSIS — H353211 Exudative age-related macular degeneration, right eye, with active choroidal neovascularization: Secondary | ICD-10-CM | POA: Diagnosis not present

## 2018-10-12 ENCOUNTER — Telehealth: Payer: Self-pay | Admitting: *Deleted

## 2018-10-12 NOTE — Telephone Encounter (Signed)
Left message on voicemail to call office. Need to cancel Nurse Visit on 4/2 for B12 injection and reschedule one month out. Meanwhile pt to take Vit B12 1000 mcg OTC supplement one tablet daily until next injection.

## 2018-10-20 ENCOUNTER — Ambulatory Visit: Payer: Medicare HMO

## 2018-11-01 ENCOUNTER — Other Ambulatory Visit: Payer: Self-pay

## 2018-11-01 ENCOUNTER — Encounter: Payer: Medicare HMO | Admitting: *Deleted

## 2018-11-01 LAB — CUP PACEART REMOTE DEVICE CHECK
Date Time Interrogation Session: 20200414120415
Implantable Lead Implant Date: 20191009
Implantable Lead Location: 753860
Implantable Lead Model: 1948
Implantable Pulse Generator Implant Date: 20191009
Pulse Gen Model: 1272
Pulse Gen Serial Number: 9067558

## 2018-11-07 DIAGNOSIS — H353211 Exudative age-related macular degeneration, right eye, with active choroidal neovascularization: Secondary | ICD-10-CM | POA: Diagnosis not present

## 2018-11-11 ENCOUNTER — Telehealth: Payer: Self-pay | Admitting: Hematology and Oncology

## 2018-11-11 NOTE — Telephone Encounter (Signed)
Returned patient's phone call regarding cancelling an appointment, left a voicemail. °

## 2018-11-14 ENCOUNTER — Ambulatory Visit: Payer: Medicare HMO | Admitting: Family Medicine

## 2018-11-17 ENCOUNTER — Telehealth: Payer: Self-pay | Admitting: Hematology and Oncology

## 2018-11-17 NOTE — Telephone Encounter (Signed)
Returned patient's phone call regarding cancelling an appointment, per patient's request due to Covid-19 and personal reasons appointment 05/12 and 05/14 has been cancelled.   Message to provider.

## 2018-11-22 ENCOUNTER — Ambulatory Visit: Payer: Medicare HMO | Admitting: Family Medicine

## 2018-11-24 IMAGING — DX DG CERVICAL SPINE COMPLETE 4+V
5 series · 5 of 5 positions shown · non-contrast
Comparison: None.

CLINICAL DATA: Chronic left-sided neck pain. Left ear pain.
Decreased range of motion. Crepitus.

EXAM:
CERVICAL SPINE - COMPLETE 4+ VIEW

[cervical spine lat]
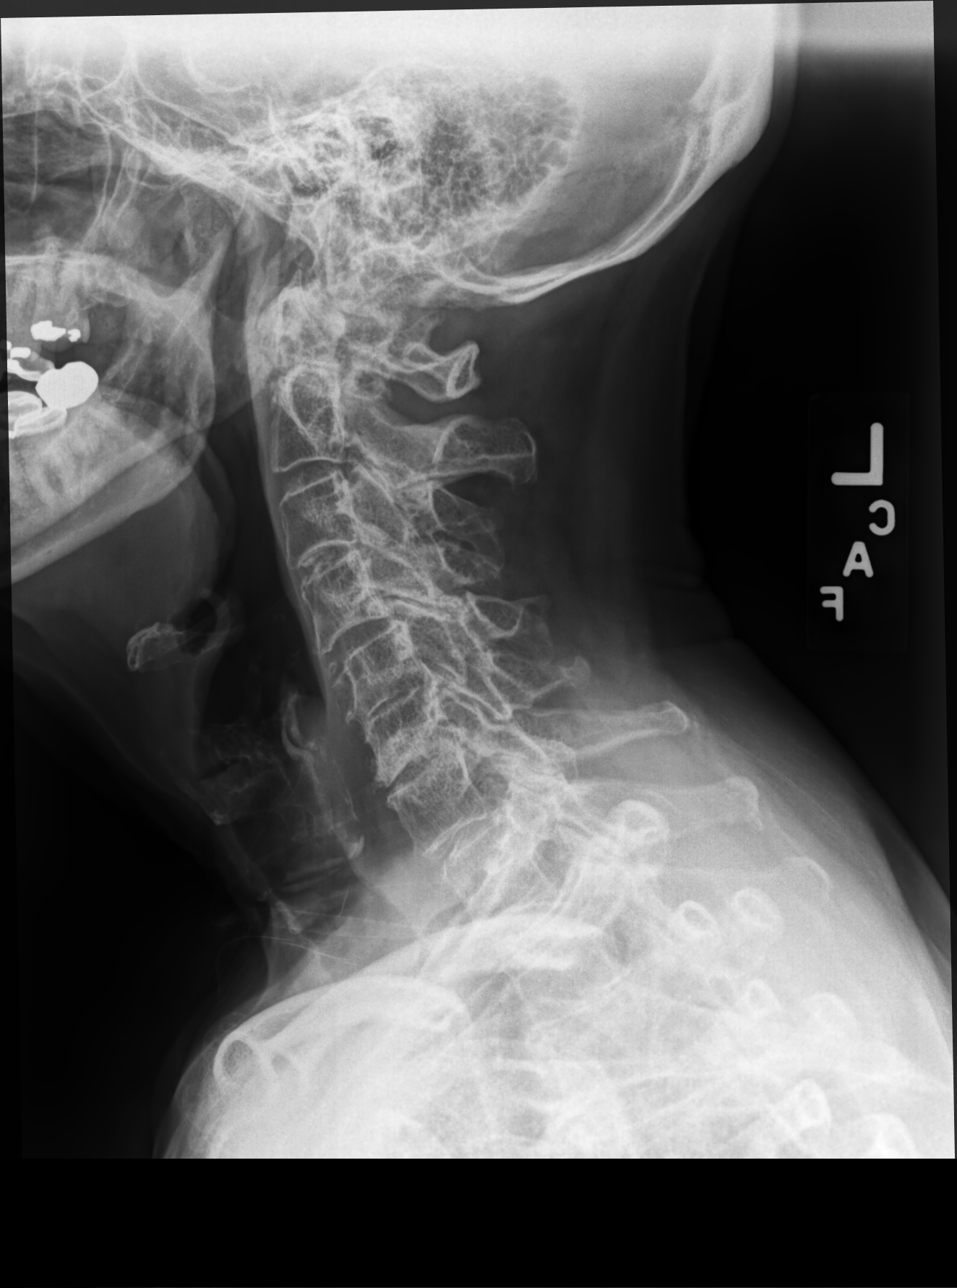

[cervical spine oblique (1 of 2)]
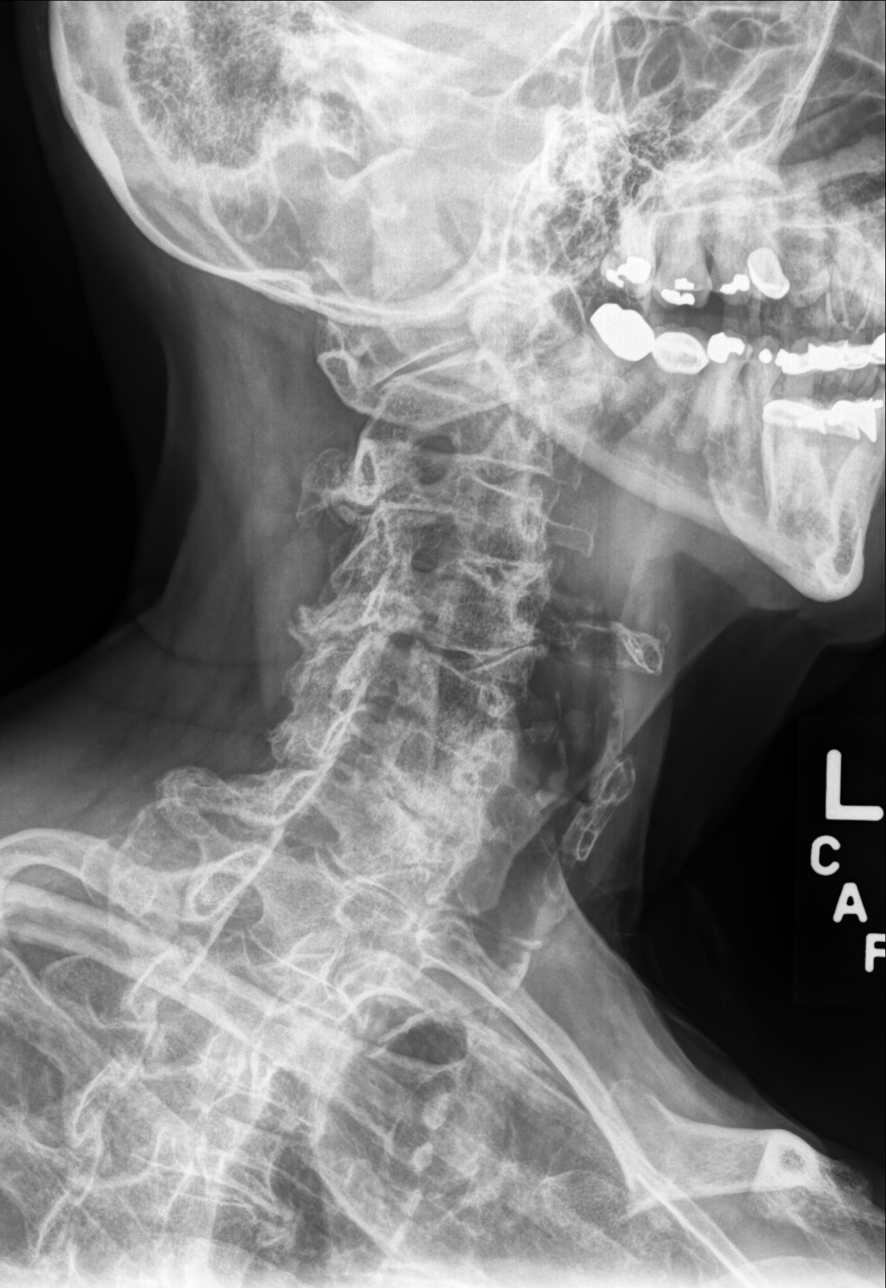

[cervical spine oblique (2 of 2)]
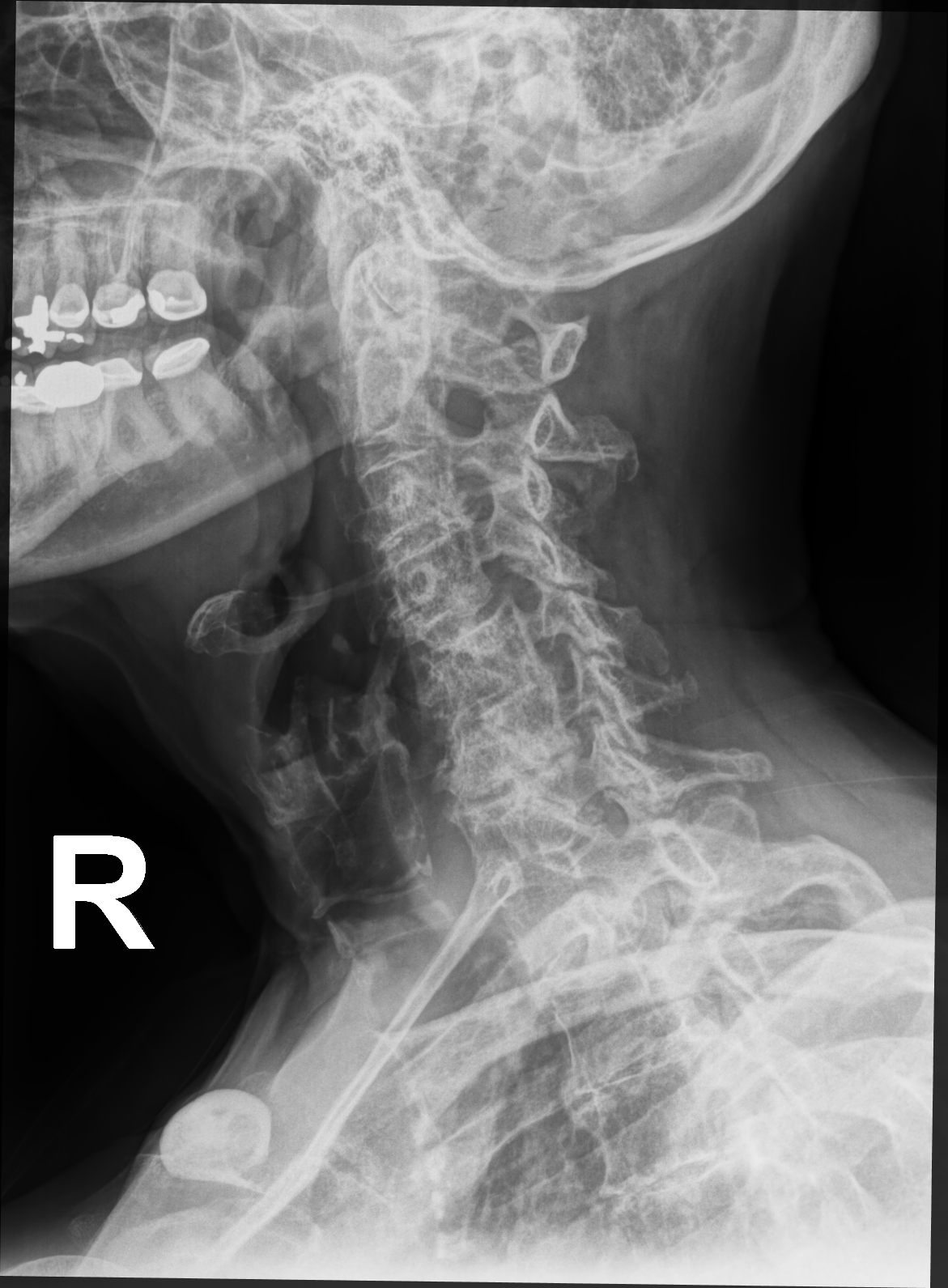

[cervical spine ap (1 of 2)]
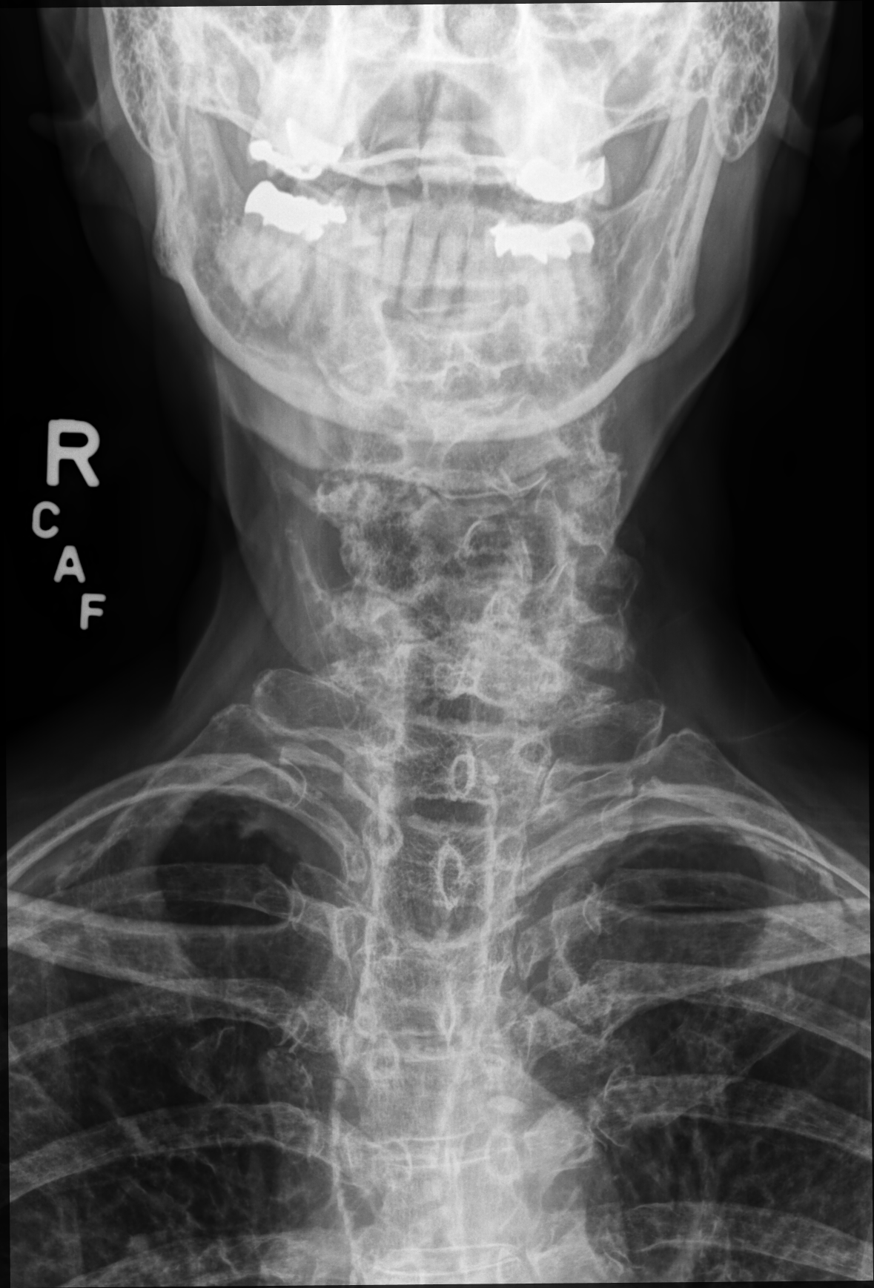

[cervical spine ap (2 of 2)]
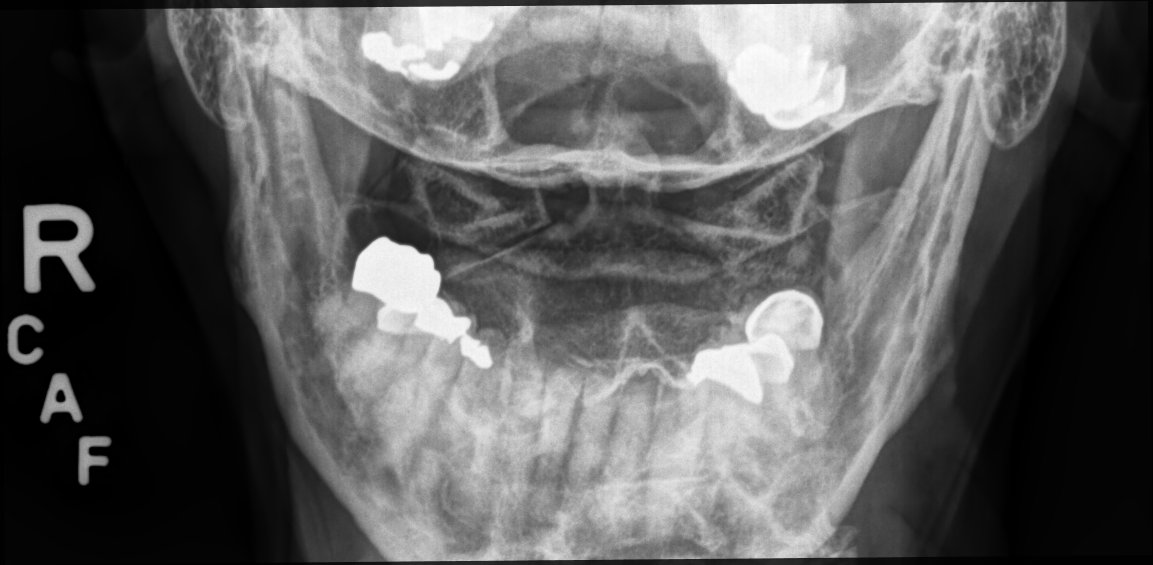

[5 of 5 positions shown; findings below may reference images not displayed]

FINDINGS: There is slight anterolisthesis of C4 on C5 and of C7 on T1 due to
facet arthritis at each level. There is marked disc space narrowing
at C5-6 and C6-7.

Prevertebral soft tissues are normal.  No severe foraminal stenosis.

Severe bilateral facet arthritis at C4-5 and C7-T1. Moderately
severe facet arthritis at C2-3 on the right. Lesser degrees of facet
arthritis throughout the remainder of the cervical spine.

Moderate degenerative changes between the odontoid in the anterior
arch of C1.
IMPRESSION: 1. No acute abnormality.
2. Degenerative disc disease primarily at C5-6 and C6-7.
3. Severe bilateral facet arthritis at C4-5 and C7-T1 and to a
lesser degree at C2-3 on the right.

## 2018-11-29 ENCOUNTER — Other Ambulatory Visit: Payer: Medicare HMO

## 2018-12-01 ENCOUNTER — Ambulatory Visit: Payer: Medicare HMO | Admitting: Hematology and Oncology

## 2018-12-14 DIAGNOSIS — H353211 Exudative age-related macular degeneration, right eye, with active choroidal neovascularization: Secondary | ICD-10-CM | POA: Diagnosis not present

## 2018-12-14 DIAGNOSIS — H353221 Exudative age-related macular degeneration, left eye, with active choroidal neovascularization: Secondary | ICD-10-CM | POA: Diagnosis not present

## 2018-12-14 DIAGNOSIS — H353231 Exudative age-related macular degeneration, bilateral, with active choroidal neovascularization: Secondary | ICD-10-CM | POA: Diagnosis not present

## 2018-12-22 DIAGNOSIS — M5416 Radiculopathy, lumbar region: Secondary | ICD-10-CM | POA: Diagnosis not present

## 2019-01-18 DIAGNOSIS — H353231 Exudative age-related macular degeneration, bilateral, with active choroidal neovascularization: Secondary | ICD-10-CM | POA: Diagnosis not present

## 2019-01-18 DIAGNOSIS — H353211 Exudative age-related macular degeneration, right eye, with active choroidal neovascularization: Secondary | ICD-10-CM | POA: Diagnosis not present

## 2019-01-18 DIAGNOSIS — H353221 Exudative age-related macular degeneration, left eye, with active choroidal neovascularization: Secondary | ICD-10-CM | POA: Diagnosis not present

## 2019-01-30 ENCOUNTER — Emergency Department (HOSPITAL_COMMUNITY): Payer: Medicare HMO

## 2019-01-30 ENCOUNTER — Encounter: Payer: Self-pay | Admitting: Internal Medicine

## 2019-01-30 ENCOUNTER — Other Ambulatory Visit: Payer: Self-pay

## 2019-01-30 ENCOUNTER — Encounter (HOSPITAL_COMMUNITY): Payer: Self-pay | Admitting: Family Medicine

## 2019-01-30 ENCOUNTER — Inpatient Hospital Stay (HOSPITAL_COMMUNITY)
Admission: EM | Admit: 2019-01-30 | Discharge: 2019-02-01 | DRG: 281 | Disposition: A | Discharge status: Home Health | Payer: Medicare HMO | Attending: Family Medicine | Admitting: Family Medicine

## 2019-01-30 DIAGNOSIS — Z66 Do not resuscitate: Secondary | ICD-10-CM | POA: Diagnosis present

## 2019-01-30 DIAGNOSIS — I4821 Permanent atrial fibrillation: Secondary | ICD-10-CM | POA: Diagnosis present

## 2019-01-30 DIAGNOSIS — I2510 Atherosclerotic heart disease of native coronary artery without angina pectoris: Secondary | ICD-10-CM | POA: Diagnosis present

## 2019-01-30 DIAGNOSIS — R531 Weakness: Secondary | ICD-10-CM | POA: Diagnosis not present

## 2019-01-30 DIAGNOSIS — D649 Anemia, unspecified: Secondary | ICD-10-CM | POA: Diagnosis present

## 2019-01-30 DIAGNOSIS — Z79899 Other long term (current) drug therapy: Secondary | ICD-10-CM

## 2019-01-30 DIAGNOSIS — R079 Chest pain, unspecified: Secondary | ICD-10-CM | POA: Diagnosis not present

## 2019-01-30 DIAGNOSIS — Z8673 Personal history of transient ischemic attack (TIA), and cerebral infarction without residual deficits: Secondary | ICD-10-CM

## 2019-01-30 DIAGNOSIS — R61 Generalized hyperhidrosis: Secondary | ICD-10-CM | POA: Diagnosis not present

## 2019-01-30 DIAGNOSIS — Z7901 Long term (current) use of anticoagulants: Secondary | ICD-10-CM

## 2019-01-30 DIAGNOSIS — Z90710 Acquired absence of both cervix and uterus: Secondary | ICD-10-CM

## 2019-01-30 DIAGNOSIS — Z1159 Encounter for screening for other viral diseases: Secondary | ICD-10-CM | POA: Diagnosis not present

## 2019-01-30 DIAGNOSIS — I214 Non-ST elevation (NSTEMI) myocardial infarction: Secondary | ICD-10-CM | POA: Diagnosis present

## 2019-01-30 DIAGNOSIS — D509 Iron deficiency anemia, unspecified: Secondary | ICD-10-CM | POA: Diagnosis present

## 2019-01-30 DIAGNOSIS — K219 Gastro-esophageal reflux disease without esophagitis: Secondary | ICD-10-CM | POA: Diagnosis present

## 2019-01-30 DIAGNOSIS — I160 Hypertensive urgency: Secondary | ICD-10-CM | POA: Diagnosis present

## 2019-01-30 DIAGNOSIS — I4891 Unspecified atrial fibrillation: Secondary | ICD-10-CM | POA: Diagnosis present

## 2019-01-30 DIAGNOSIS — K769 Liver disease, unspecified: Secondary | ICD-10-CM | POA: Diagnosis present

## 2019-01-30 DIAGNOSIS — J479 Bronchiectasis, uncomplicated: Secondary | ICD-10-CM | POA: Diagnosis not present

## 2019-01-30 DIAGNOSIS — I6523 Occlusion and stenosis of bilateral carotid arteries: Secondary | ICD-10-CM | POA: Diagnosis not present

## 2019-01-30 DIAGNOSIS — R110 Nausea: Secondary | ICD-10-CM | POA: Diagnosis not present

## 2019-01-30 DIAGNOSIS — I495 Sick sinus syndrome: Secondary | ICD-10-CM | POA: Diagnosis present

## 2019-01-30 DIAGNOSIS — I10 Essential (primary) hypertension: Secondary | ICD-10-CM | POA: Diagnosis present

## 2019-01-30 DIAGNOSIS — E876 Hypokalemia: Secondary | ICD-10-CM | POA: Diagnosis present

## 2019-01-30 DIAGNOSIS — F419 Anxiety disorder, unspecified: Secondary | ICD-10-CM | POA: Diagnosis present

## 2019-01-30 DIAGNOSIS — J984 Other disorders of lung: Secondary | ICD-10-CM | POA: Diagnosis not present

## 2019-01-30 DIAGNOSIS — M810 Age-related osteoporosis without current pathological fracture: Secondary | ICD-10-CM | POA: Diagnosis present

## 2019-01-30 DIAGNOSIS — Z20828 Contact with and (suspected) exposure to other viral communicable diseases: Secondary | ICD-10-CM | POA: Diagnosis not present

## 2019-01-30 DIAGNOSIS — Z96641 Presence of right artificial hip joint: Secondary | ICD-10-CM | POA: Diagnosis present

## 2019-01-30 DIAGNOSIS — Z950 Presence of cardiac pacemaker: Secondary | ICD-10-CM | POA: Diagnosis not present

## 2019-01-30 DIAGNOSIS — E039 Hypothyroidism, unspecified: Secondary | ICD-10-CM | POA: Diagnosis present

## 2019-01-30 LAB — COMPREHENSIVE METABOLIC PANEL
ALT: 18 U/L (ref 0–44)
AST: 23 U/L (ref 15–41)
Albumin: 3.8 g/dL (ref 3.5–5.0)
Alkaline Phosphatase: 102 U/L (ref 38–126)
Anion gap: 11 (ref 5–15)
BUN: 15 mg/dL (ref 8–23)
CO2: 25 mmol/L (ref 22–32)
Calcium: 9.4 mg/dL (ref 8.9–10.3)
Chloride: 98 mmol/L (ref 98–111)
Creatinine, Ser: 0.77 mg/dL (ref 0.44–1.00)
GFR calc Af Amer: >60 mL/min (ref >60)
GFR calc non Af Amer: >60 mL/min (ref >60)
Glucose, Bld: 100 mg/dL — ABNORMAL HIGH (ref 70–99)
Potassium: 4.2 mmol/L (ref 3.5–5.1)
Sodium: 134 mmol/L — ABNORMAL LOW (ref 135–145)
Total Bilirubin: 0.6 mg/dL (ref 0.3–1.2)
Total Protein: 6.6 g/dL (ref 6.5–8.1)

## 2019-01-30 LAB — CBC WITH DIFFERENTIAL/PLATELET
Abs Immature Granulocytes: 0.02 10*3/uL (ref 0.00–0.07)
Basophils Absolute: 0 10*3/uL (ref 0.0–0.1)
Basophils Relative: 1 %
Eosinophils Absolute: 0.1 10*3/uL (ref 0.0–0.5)
Eosinophils Relative: 2 %
HCT: 37.5 % (ref 36.0–46.0)
Hemoglobin: 12.5 g/dL (ref 12.0–15.0)
Immature Granulocytes: 0 %
Lymphocytes Relative: 9 %
Lymphs Abs: 0.6 10*3/uL — ABNORMAL LOW (ref 0.7–4.0)
MCH: 31.5 pg (ref 26.0–34.0)
MCHC: 33.3 g/dL (ref 30.0–36.0)
MCV: 94.5 fL (ref 80.0–100.0)
Monocytes Absolute: 0.6 10*3/uL (ref 0.1–1.0)
Monocytes Relative: 9 %
Neutro Abs: 5.1 10*3/uL (ref 1.7–7.7)
Neutrophils Relative %: 79 %
Platelets: 150 10*3/uL (ref 150–400)
RBC: 3.97 MIL/uL (ref 3.87–5.11)
RDW: 13 % (ref 11.5–15.5)
WBC: 6.4 10*3/uL (ref 4.0–10.5)
nRBC: 0 % (ref 0.0–0.2)

## 2019-01-30 LAB — URINALYSIS, ROUTINE W REFLEX MICROSCOPIC
Bilirubin Urine: NEGATIVE
Glucose, UA: NEGATIVE mg/dL
Hgb urine dipstick: NEGATIVE
Ketones, ur: 5 mg/dL — AB
Leukocytes,Ua: NEGATIVE
Nitrite: NEGATIVE
Protein, ur: NEGATIVE mg/dL
Specific Gravity, Urine: 1.009 (ref 1.005–1.030)
pH: 6 (ref 5.0–8.0)

## 2019-01-30 LAB — TROPONIN I (HIGH SENSITIVITY)
Troponin I (High Sensitivity): 7 ng/L (ref <18)
Troponin I (High Sensitivity): 34 ng/L — ABNORMAL HIGH (ref <18)
Troponin I (High Sensitivity): 61 ng/L — ABNORMAL HIGH (ref <18)

## 2019-01-30 LAB — LIPASE, BLOOD: Lipase: 32 U/L (ref 11–51)

## 2019-01-30 LAB — BRAIN NATRIURETIC PEPTIDE: B Natriuretic Peptide: 168.5 pg/mL — ABNORMAL HIGH (ref 0.0–100.0)

## 2019-01-30 MED ORDER — IOHEXOL 350 MG/ML SOLN
75.0000 mL | Freq: Once | INTRAVENOUS | Status: AC | PRN
  Administered 2019-01-30: 18:00:00 75 mL via INTRAVENOUS

## 2019-01-30 MED ORDER — HYDRALAZINE HCL 20 MG/ML IJ SOLN
5.0000 mg | Freq: Once | INTRAMUSCULAR | Status: AC
  Administered 2019-01-30: 5 mg via INTRAVENOUS
  Filled 2019-01-30: qty 1

## 2019-01-30 MED ORDER — ACETAMINOPHEN 500 MG PO TABS
1000.0000 mg | ORAL_TABLET | Freq: Once | ORAL | Status: AC
  Administered 2019-01-30: 1000 mg via ORAL
  Filled 2019-01-30: qty 2

## 2019-01-30 MED ORDER — HYDRALAZINE HCL 20 MG/ML IJ SOLN: 5.0000 mg | Freq: Once | INTRAMUSCULAR | Status: DC

## 2019-01-30 MED ORDER — HEPARIN (PORCINE) 25000 UT/250ML-% IV SOLN
500.0000 [IU]/h | INTRAVENOUS | Status: DC
  Administered 2019-01-31: 650 [IU]/h via INTRAVENOUS
  Filled 2019-01-30: qty 250

## 2019-01-30 MED ORDER — METOPROLOL TARTRATE 25 MG PO TABS
25.0000 mg | ORAL_TABLET | Freq: Two times a day (BID) | ORAL | Status: DC
  Administered 2019-01-31 (×2): 25 mg via ORAL
  Filled 2019-01-30 (×2): qty 1

## 2019-01-30 MED ORDER — METOCLOPRAMIDE HCL 5 MG/ML IJ SOLN
10.0000 mg | Freq: Once | INTRAMUSCULAR | Status: AC
  Administered 2019-01-30: 10 mg via INTRAVENOUS
  Filled 2019-01-30: qty 2

## 2019-01-30 MED ORDER — SODIUM CHLORIDE 0.9 % IV BOLUS: 1000.0000 mL | Freq: Once | INTRAVENOUS | Status: DC

## 2019-01-30 MED ORDER — ASPIRIN 325 MG PO TABS
325.0000 mg | ORAL_TABLET | Freq: Once | ORAL | Status: AC
  Administered 2019-01-30: 325 mg via ORAL
  Filled 2019-01-30: qty 1

## 2019-01-30 MED ORDER — LEVOFLOXACIN IN D5W 750 MG/150ML IV SOLN
750.0000 mg | Freq: Once | INTRAVENOUS | Status: AC
  Administered 2019-01-30: 750 mg via INTRAVENOUS
  Filled 2019-01-30: qty 150

## 2019-01-30 MED ORDER — DIPHENHYDRAMINE HCL 50 MG/ML IJ SOLN
12.5000 mg | Freq: Once | INTRAMUSCULAR | Status: AC
  Administered 2019-01-30: 12.5 mg via INTRAVENOUS
  Filled 2019-01-30: qty 1

## 2019-01-30 MED ORDER — HEPARIN BOLUS VIA INFUSION
2700.0000 [IU] | Freq: Once | INTRAVENOUS | Status: AC
  Administered 2019-01-31: 2700 [IU] via INTRAVENOUS
  Filled 2019-01-30: qty 2700

## 2019-01-30 MED ORDER — NITROGLYCERIN 0.4 MG SL SUBL: 0.4000 mg | SUBLINGUAL_TABLET | SUBLINGUAL | Status: DC | PRN

## 2019-01-30 NOTE — ED Notes (Signed)
[employer] called to notify that patient has had a controlled rate that has been about 55 for most of the time. Reports there were not notable abnormalities that they saw. They spoke with patient's cardiologist and reported that the cardiologist was planning on adjusting patient's home medication.

## 2019-01-30 NOTE — ED Notes (Signed)
Report attempted 

## 2019-01-30 NOTE — ED Provider Notes (Signed)
[facility] EMERGENCY DEPARTMENT Provider Note   CSN: [id number] Arrival date & time: 01/30/19  1421    History   Chief Complaint Chief Complaint  Patient presents with   Weakness   Facial Pain    HPI Megan Olsen is a 85 y.o. female hx of afib on eliquis, depression, HTN, here presenting with headache, left neck pain, blurry vision, double vision.  Patient states that this morning she had acute onset of double vision and blurry vision.  Also states that the left side of her head hurts as well as she has some left-sided neck pain. Denies any fevers or chills or neck stiffness .  She also states that she felt a funny sensation around her pacemaker site but denies any palpitations or chest pain.  Patient states that she has a English as a second language teacher for tachybradycardia syndrome.  She follows with Dr. Caryl Comes.  Patient had a previous stroke but is compliant with her Eliquis and has no trouble speaking or weakness or numbness.     The history is provided by the patient.    Past Medical History:  Diagnosis Date   Age-related macular degeneration, wet, both eyes ([place])    Anxiety disorder 07/06/2014   Arthritis    "all over" (04/27/2018)   Atrial fibrillation ([place])    catheter ablation of SVT in 2002   Benign essential HTN 07/06/2014   Constipation, intermittent 08/04/2007   CVA (cerebrovascular accident) ([place]) 2003   left brain; denies residual on 04/27/2018   Depression    GERD (gastroesophageal reflux disease)    Heart murmur    hx (04/27/2018)   Hypothyroidism 09/07/2014   "off RX now" (04/27/2018)   Iron deficiency anemia    Iron deficiency anemia, unspecified 01/02/2013   Osteoarthritis    Osteoporosis 01/11/2007   Pneumonia    "one time; years and years ago" (04/27/2018)   Presence of permanent cardiac pacemaker 04/27/2018    Dual Chamber   Right BBB/left ant fasc block     Patient Active Problem List   Diagnosis Date Noted   B12  deficiency 08/22/2018   Facet arthritis, degenerative, cervical spine 08/22/2018   Tachy-brady syndrome ([place]) 04/27/2018   Cervical spondylosis without myelopathy 03/11/2018   Myofascial pain syndrome 03/11/2018   Cervicalgia 03/11/2018   Insomnia 01/26/2018   High risk medication use 01/26/2018   Macular degeneration of both eyes 01/26/2018   S/P right THA, AA 02/23/2017   Hypothyroidism 09/07/2014   A-fib ([place]), s/p catheter ablation of SVT in 2002 07/06/2014   History of fracture of left hip 07/06/2014   Benign essential HTN 07/06/2014   GERD (gastroesophageal reflux disease) 07/06/2014   Anxiety disorder 07/06/2014   History of CVA (cerebrovascular accident), without residual effects 07/06/2014   Iron deficiency anemia 01/02/2013   Constipation, intermittent 08/04/2007   Depression 01/11/2007   Allergic rhinitis 01/11/2007   Osteoarthritis 01/11/2007   Osteoporosis 01/11/2007    Past Surgical History:  Procedure Laterality Date   APPENDECTOMY     CATARACT EXTRACTION W/ INTRAOCULAR LENS  IMPLANT, BILATERAL Bilateral    CHOLECYSTECTOMY OPEN     COLONOSCOPY     EYE SURGERY     FRACTURE SURGERY     HIP PINNING,CANNULATED Left 07/07/2014   Procedure: CANNULATED HIP PINNING;  Surgeon: Mauri Pole, MD;  Location: [facility];  Service: Orthopedics;  Laterality: Left;   INSERT / REPLACE / REMOVE PACEMAKER  04/27/2018    Dual Chamber   JOINT REPLACEMENT  LAPAROSCOPIC OVARIAN CYSTECTOMY     NASAL SEPTUM SURGERY     PACEMAKER IMPLANT N/A 04/27/2018   Procedure: PACEMAKER IMPLANT - Dual Chamber;  Surgeon: Deboraha Sprang, MD;  Location: [facility];  Service: Cardiovascular;  Laterality: N/A;   SVT ABLATION  2002   TONSILLECTOMY     TOTAL ABDOMINAL HYSTERECTOMY     TOTAL HIP ARTHROPLASTY Right 02/23/2017   Procedure: RIGHT TOTAL HIP ARTHROPLASTY ANTERIOR APPROACH;  Surgeon: Paralee Cancel, MD;  Location: [facility];  Service: Orthopedics;   Laterality: Right;   VITRECTOMY Right 2008   dr Zadie Rhine.  Vitrectomy and removal of tissue.      OB History   No obstetric history on file.      Home Medications    Prior to Admission medications   Medication Sig Start Date End Date Taking? Authorizing Provider  acetaminophen (TYLENOL) 650 MG CR tablet Take 650-1,300 mg by mouth every 8 (eight) hours as needed for pain.    [provider]  apixaban (ELIQUIS) 2.5 MG TABS tablet Take 1 tablet (2.5 mg total) by mouth 2 (two) times daily. 06/03/18   Deboraha Sprang, MD  bisoprolol (ZEBETA) 5 MG tablet Take 1 tablet (5 mg total) by mouth daily. 09/29/18   Deboraha Sprang, MD  Carboxymethylcellulose Sodium (THERATEARS) 0.25 % SOLN Place 1 drop into both eyes 3 (three) times daily as needed (for dry/irritated eyes.).    [provider]  Cholecalciferol (VITAMIN D3) 2000 units TABS Take 2,000 Units by mouth daily.    [provider]  dexlansoprazole (DEXILANT) 60 MG capsule Take 1 capsule (60 mg total) by mouth daily. 04/15/17   Marletta Lor, MD  LORazepam (ATIVAN) 1 MG tablet Take 1 tablet (1 mg total) by mouth at bedtime. 08/22/18   Briscoe Deutscher, DO  Multiple Vitamins-Minerals (PRESERVISION AREDS 2) CAPS Take 1 capsule by mouth 2 (two) times daily. 04/15/17   Marletta Lor, MD  NON FORMULARY Place 1 Dose into both eyes every 6 (six) weeks. Receives injections into both eyes every 6 weeks at [facility])    [provider]  Syringe, Disposable, (2-3CC SYRINGE) 3 ML MISC 1 Package by Does not apply route every 30 (thirty) days. 09/12/18   Nicholas Lose, MD    Family History Family History  Problem Relation Age of Onset   Early death Mother    Nephritis Mother    Kidney disease Mother    Heart attack Father    Heart disease Sister    Dementia Sister    Heart attack Brother    Dementia Sister     Social History Social History   Tobacco Use    Smoking status: Never Smoker   Smokeless tobacco: Never Used  Substance Use Topics   Alcohol use: Yes    Comment: 04/27/2018 "a beer q 3 months or so; if that"   Drug use: Never     Allergies   Propoxyphene n-acetaminophen, Celecoxib, Fexofenadine, and Penicillins   Review of Systems Review of Systems  Eyes: Positive for visual disturbance.  Neurological: Positive for dizziness.  All other systems reviewed and are negative.    Physical Exam Updated Vital Signs BP (!) 176/61    Pulse (!) 54    Temp (!) 97.5 F (36.4 C) (Oral)    Resp 16    Ht 5\' 4"  (1.626 m)    Wt 54.4 kg    SpO2 95%    BMI 20.60  kg/m   Physical Exam Vitals signs and nursing note reviewed.  HENT:     Head: Normocephalic.     Nose: Nose normal.     Mouth/Throat:     Mouth: Mucous membranes are moist.  Eyes:     Extraocular Movements: Extraocular movements intact.     Pupils: Pupils are equal, round, and reactive to light.     Comments: No obvious nystagmus   Neck:     Musculoskeletal: Normal range of motion.     Comments: No bruit  Cardiovascular:     Rate and Rhythm: Normal rate and regular rhythm.     Pulses: Normal pulses.     Heart sounds: Normal heart sounds.  Pulmonary:     Effort: Pulmonary effort is normal.     Breath sounds: Normal breath sounds.  Abdominal:     General: Abdomen is flat.     Palpations: Abdomen is soft.  Musculoskeletal: Normal range of motion.  Skin:    General: Skin is warm.     Capillary Refill: Capillary refill takes less than 2 seconds.  Neurological:     General: No focal deficit present.     Mental Status: She is alert and oriented to person, place, and time.     Comments: CN 2- 12 intact, nl strength and sensation throughout. Nl finger to nose. No pronator drift.   Psychiatric:        Mood and Affect: Mood normal.      ED Treatments / Results  Labs (all labs ordered are listed, but only abnormal results are displayed) Labs Reviewed  CBC WITH  DIFFERENTIAL/PLATELET - Abnormal; Notable for the following components:      Result Value   Lymphs Abs 0.6 (*)    All other components within normal limits  COMPREHENSIVE METABOLIC PANEL - Abnormal; Notable for the following components:   Sodium 134 (*)    Glucose, Bld 100 (*)    All other components within normal limits  URINALYSIS, ROUTINE W REFLEX MICROSCOPIC - Abnormal; Notable for the following components:   Ketones, ur 5 (*)    All other components within normal limits  BRAIN NATRIURETIC PEPTIDE - Abnormal; Notable for the following components:   B Natriuretic Peptide 168.5 (*)    All other components within normal limits  TROPONIN I (HIGH SENSITIVITY) - Abnormal; Notable for the following components:   Troponin I (High Sensitivity) 34 (*)    All other components within normal limits  LIPASE, BLOOD  TROPONIN I (HIGH SENSITIVITY)  TROPONIN I (HIGH SENSITIVITY)    EKG EKG Interpretation  Date/Time:  Monday January 30 2019 15:55:07 EDT Ventricular Rate:  50 PR Interval:    QRS Duration: 143 QT Interval:  519 QTC Calculation: 474 R Axis:   -74 Text Interpretation:  paced  IVCD, consider atypical RBBB LVH with IVCD, LAD and secondary repol abnrm Probable inferior infarct, acute Anterolateral infarct, old paced  Confirmed by Wandra Arthurs [phone]) on 01/30/2019 4:50:25 PM   Radiology Ct Angio Head W Or Wo Contrast  Result Date: 01/30/2019 CLINICAL DATA:  Headache. Rule out cerebral aneurysm subarachnoid hemorrhage or cerebral vasospasm EXAM: CT ANGIOGRAPHY HEAD AND NECK TECHNIQUE: Multidetector CT imaging of the head and neck was performed using the standard protocol during bolus administration of intravenous contrast. Multiplanar CT image reconstructions and MIPs were obtained to evaluate the vascular anatomy. Carotid stenosis measurements (when applicable) are obtained utilizing NASCET criteria, using the distal internal carotid diameter as the denominator. CONTRAST:  85mL OMNIPAQUE  IOHEXOL 350 MG/ML SOLN COMPARISON:  None. FINDINGS: CT HEAD FINDINGS Brain: Mild atrophy. Patchy white matter disease bilaterally is symmetric and most likely chronic. No acute cortical infarct. Negative for hemorrhage or mass. Vascular: Negative for hyperdense vessel Skull: Negative Sinuses: Mild mucosal edema paranasal sinuses. Negative orbit Orbits: Review of the MIP images confirms the above findings CTA NECK FINDINGS Aortic arch: Standard branching. Imaged portion shows no evidence of aneurysm or dissection. No significant stenosis of the major arch vessel origins. Left-sided transvenous pacemaker noted. Right carotid system: Right carotid system widely patent without significant stenosis. Mild atherosclerotic calcification right carotid bifurcation Left carotid system: Left carotid system widely patent without stenosis. Mild atherosclerotic calcification left carotid bifurcation Vertebral arteries: Both vertebral arteries patent to the basilar without significant stenosis. Skeleton: Cervical spondylosis. Negative for acute skeletal abnormality. Other neck: Negative for mass or adenopathy Upper chest: Mild apically scarring bilaterally. No acute abnormality. Review of the MIP images confirms the above findings CTA HEAD FINDINGS Anterior circulation: Atherosclerotic calcification in the cavernous carotid bilaterally with mild stenosis. Anterior and middle cerebral arteries patent bilaterally without stenosis occlusion or aneurysm. Posterior circulation: Both vertebral arteries patent to the basilar. Basilar widely patent. PICA patent bilaterally. AICA, and superior cerebellar and posterior cerebral arteries patent bilaterally. Fetal origin left posterior cerebral artery. Venous sinuses: Normal venous enhancement. Left transverse sinus dominant. Review of the MIP images confirms the above findings IMPRESSION: 1. No significant intracranial or extracranial stenosis. Mild atherosclerotic disease carotid bifurcation  and cavernous carotid bilaterally. 2. Negative for intracranial large vessel occlusion 3. No acute intracranial abnormality. Negative for acute infarct or hemorrhage. Electronically Signed   By: Franchot Gallo M.D.   On: 01/30/2019 18:35   Ct Angio Neck W And/or Wo Contrast  Result Date: 01/30/2019 CLINICAL DATA:  Headache. Rule out cerebral aneurysm subarachnoid hemorrhage or cerebral vasospasm EXAM: CT ANGIOGRAPHY HEAD AND NECK TECHNIQUE: Multidetector CT imaging of the head and neck was performed using the standard protocol during bolus administration of intravenous contrast. Multiplanar CT image reconstructions and MIPs were obtained to evaluate the vascular anatomy. Carotid stenosis measurements (when applicable) are obtained utilizing NASCET criteria, using the distal internal carotid diameter as the denominator. CONTRAST:  3mL OMNIPAQUE IOHEXOL 350 MG/ML SOLN COMPARISON:  None. FINDINGS: CT HEAD FINDINGS Brain: Mild atrophy. Patchy white matter disease bilaterally is symmetric and most likely chronic. No acute cortical infarct. Negative for hemorrhage or mass. Vascular: Negative for hyperdense vessel Skull: Negative Sinuses: Mild mucosal edema paranasal sinuses. Negative orbit Orbits: Review of the MIP images confirms the above findings CTA NECK FINDINGS Aortic arch: Standard branching. Imaged portion shows no evidence of aneurysm or dissection. No significant stenosis of the major arch vessel origins. Left-sided transvenous pacemaker noted. Right carotid system: Right carotid system widely patent without significant stenosis. Mild atherosclerotic calcification right carotid bifurcation Left carotid system: Left carotid system widely patent without stenosis. Mild atherosclerotic calcification left carotid bifurcation Vertebral arteries: Both vertebral arteries patent to the basilar without significant stenosis. Skeleton: Cervical spondylosis. Negative for acute skeletal abnormality. Other neck: Negative  for mass or adenopathy Upper chest: Mild apically scarring bilaterally. No acute abnormality. Review of the MIP images confirms the above findings CTA HEAD FINDINGS Anterior circulation: Atherosclerotic calcification in the cavernous carotid bilaterally with mild stenosis. Anterior and middle cerebral arteries patent bilaterally without stenosis occlusion or aneurysm. Posterior circulation: Both vertebral arteries patent to the basilar. Basilar widely patent. PICA patent bilaterally. AICA, and superior cerebellar and posterior cerebral  arteries patent bilaterally. Fetal origin left posterior cerebral artery. Venous sinuses: Normal venous enhancement. Left transverse sinus dominant. Review of the MIP images confirms the above findings IMPRESSION: 1. No significant intracranial or extracranial stenosis. Mild atherosclerotic disease carotid bifurcation and cavernous carotid bilaterally. 2. Negative for intracranial large vessel occlusion 3. No acute intracranial abnormality. Negative for acute infarct or hemorrhage. Electronically Signed   By: Franchot Gallo M.D.   On: 01/30/2019 18:35   Ct Chest W Contrast  Result Date: 01/30/2019 CLINICAL DATA:  Acute onset of chest pain, shortness of breath and generalized weakness that began yesterday. Abnormal chest x-ray questioning a pericardial effusion and possible pneumonia. EXAM: CT CHEST WITH CONTRAST TECHNIQUE: Multidetector CT imaging of the chest was performed during intravenous contrast administration. CONTRAST:  49mL OMNIPAQUE IOHEXOL 350 MG/ML IV. COMPARISON:  No prior chest CT. Multiple prior chest x-rays. FINDINGS: Cardiovascular: Marked cardiomegaly, with massive enlargement of the RIGHT atrium and marked enlargement of the LEFT atrium. No pericardial effusion. Mitral annular calcification. Mild aortic valvular calcification. Moderate LAD and LEFT circumflex coronary atherosclerosis. Moderate atherosclerosis involving the thoracic and proximal abdominal aorta  without evidence of aneurysm. Mediastinum/Nodes: No pathologically enlarged mediastinal, hilar or axillary lymph nodes. No mediastinal masses. Normal-appearing esophagus. Lungs/Pleura: Conglomerate scar and bronchiectasis involving the lingula, stable dating back to an October, 2019 chest x-ray. Confluent opacities associated with nodularity in the RIGHT MIDDLE LOBE. Minimal linear scarring in the LATERAL RIGHT UPPER LOBE, unchanged dating back to 2019. Biapical pleuroparenchymal scarring associated with calcifications. No confluent or ground-glass airspace consolidation elsewhere. No pleural effusions. Central airways patent without significant bronchial wall thickening. Upper Abdomen: Flash enhancement of an approximate 1.2 cm lesion in the ANTERIOR segment RIGHT lobe and a 1.5 cm lesion in the LATERAL segment LEFT lobe. Mild intrahepatic biliary ductal dilation, particularly in the LEFT lobe. Visualized upper abdomen otherwise unremarkable for the early arterial phase of enhancement. Musculoskeletal: Degenerative disc disease and spondylosis involving the visualized LOWER cervical spine and throughout the thoracic spine. No acute findings. IMPRESSION: 1. Conglomerate scar and bronchiectasis involving the lingula, stable dating back to an October, 2019, chest x-ray. 2. Confluent opacities associated with nodularity in the RIGHT MIDDLE LOBE. This may also represent scarring, though an infectious or inflammatory process is also a consideration, potentially MAI. 3. No acute cardiopulmonary disease otherwise. 4. Marked cardiomegaly with massive enlargement of the RIGHT atrium and marked enlargement of the LEFT atrium. This accounts for the chest x-ray finding. 5. Flash enhancement of an approximate 1.2 cm lesion in the ANTERIOR segment RIGHT lobe and a 1.5 cm lesion in the LATERAL segment LEFT lobe of the liver, statistically likely to represent hemangiomas/vascular malformations. 6. Mild intrahepatic biliary ductal  dilation, particularly in the LEFT lobe. The extrahepatic bile duct was not imaged. Please correlate with liver function tests. Aortic Atherosclerosis (ICD10-170.0) Electronically Signed   By: Evangeline Dakin M.D.   On: 01/30/2019 18:11   Dg Chest Port 1 View  Result Date: 01/30/2019 CLINICAL DATA:  Congestion, left axillary chest pain. EXAM: PORTABLE CHEST 1 VIEW COMPARISON:  04/28/2018 FINDINGS: Cardiac pacemaker in stable position. Marked enlargement of the cardiac silhouette, even for portable technique. Mediastinal contours appear intact. Ill-defined airspace opacity in the left mid lung field, likely within the lingula. Nodular opacities in the right lower lung field. Osseous structures are without acute abnormality. Soft tissues are grossly normal. IMPRESSION: 1. Marked enlargement of the cardiac silhouette, even considering portable AP technique. This may represent cardiomyopathy or pericardial effusion. 2.  Ill-defined airspace opacity in the left mid lung field, likely within the lingula, progressed from the prior study dated 04/28/2018. 3. Nodular opacities in the right lower lung field. Electronically Signed   By: Fidela Salisbury M.D.   On: 01/30/2019 15:21    Procedures Procedures (including critical care time)  CRITICAL CARE Performed by: Wandra Arthurs   Total critical care time: 30 minutes  Critical care time was exclusive of separately billable procedures and treating other patients.  Critical care was necessary to treat or prevent imminent or life-threatening deterioration.  Critical care was time spent personally by me on the following activities: development of treatment plan with patient and/or surrogate as well as nursing, discussions with consultants, evaluation of patient's response to treatment, examination of patient, obtaining history from patient or surrogate, ordering and performing treatments and interventions, ordering and review of laboratory studies, ordering and  review of radiographic studies, pulse oximetry and re-evaluation of patient's condition.   Medications Ordered in ED Medications  levofloxacin (LEVAQUIN) IVPB 750 mg (750 mg Intravenous New Bag/Given 01/30/19 2123)  hydrALAZINE (APRESOLINE) injection 5 mg (has no administration in time range)  acetaminophen (TYLENOL) tablet 1,000 mg (1,000 mg Oral Given 01/30/19 1614)  metoCLOPramide (REGLAN) injection 10 mg (10 mg Intravenous Given 01/30/19 1615)  diphenhydrAMINE (BENADRYL) injection 12.5 mg (12.5 mg Intravenous Given 01/30/19 1615)  iohexol (OMNIPAQUE) 350 MG/ML injection 75 mL (75 mLs Intravenous Contrast Given 01/30/19 1752)  aspirin tablet 325 mg (325 mg Oral Given 01/30/19 2115)     Initial Impression / Assessment and Plan / ED Course  I have reviewed the triage vital signs and the nursing notes.  Pertinent labs & imaging results that were available during my care of the patient were reviewed by me and considered in my medical decision making (see chart for details).        YARELI CARTHEN is a 85 y.o. female here with dizziness, blurry vision, headaches. Likely complex migraines. But patient is hypertensive so consider dissection as well. Also has tachy brady syndrome and has pacemaker so consider arrhythmia and will interrogate pacemaker. Will get CTA head/neck, CXR, labs.   10:08 PM Patient CTA was unremarkable.  Pacemaker was interrogated there was no new events.  Patient does have enlarged pericardium on her chest x-ray but CT chest showed confluence of vessels and some pulmonary nodules and possible pneumonia.  Patient does not have any cough or fever however.  Patient second troponin trended positive to 34 from 7. Patient has no active chest pain. Talked to Dr. Vickki Muff from cardiology.  He thinks that this is likely hypertensive urgency.  Given hydralazine in the ED and patient will admitted for elevated troponin and hypertensive urgency.   Final Clinical Impressions(s) / ED  Diagnoses   Final diagnoses:  None    ED Discharge Orders    None       Drenda Freeze, MD 01/30/19 2211

## 2019-01-30 NOTE — ED Triage Notes (Signed)
Pt BIB EMS from home complaining of generalized weakness that started yesterday. Pt reports that she has had sinus congestion and ear congestion on the left side for several weeks. Pt is alert and oriented x4 at present, paced on the monitor. EMS Vitals 196/84 HR 56 CBG 107  EMS gave patient 4mg  of zofran for nausea.

## 2019-01-30 NOTE — ED Notes (Signed)
ED TO INPATIENT HANDOFF REPORT  ED Nurse Name and Phone #: Benjamine Mola [phone]  S Name/Age/Gender Megan Olsen 85 y.o. female Room/Bed: 038C/038C  Code Status   Code Status: Prior  Home/SNF/Other Home Patient oriented to: self, place, time and situation Is this baseline? Yes   Triage Complete: Triage complete  Chief Complaint Weakness  Triage Note Pt BIB EMS from home complaining of generalized weakness that started yesterday. Pt reports that she has had sinus congestion and ear congestion on the left side for several weeks. Pt is alert and oriented x4 at present, paced on the monitor. EMS Vitals 196/84 HR 56 CBG 107  EMS gave patient 4mg  of zofran for nausea.    Allergies Allergies  Allergen Reactions  . Propoxyphene N-Acetaminophen Nausea Only  . Celecoxib Itching and Rash  . Fexofenadine Rash  . Penicillins Hives and Rash    Has patient had a PCN reaction causing immediate rash, facial/tongue/throat swelling, SOB or lightheadedness with hypotension: Yes Has patient had a PCN reaction causing severe rash involving mucus membranes or skin necrosis: No Has patient had a PCN reaction that required hospitalization: No Has patient had a PCN reaction occurring within the last 10 years: No If all of the above answers are "NO", then may proceed with Cephalosporin use.     Level of Care/Admitting Diagnosis ED Disposition    ED Disposition Condition [facility]: [place] [[id number]]  Level of Care: Telemetry Cardiac [103]  I expect the patient will be discharged within 24 hours: Yes  LOW acuity---Tx typically complete <24 hrs---ACUTE conditions typically can be evaluated <24 hours---LABS likely to return to acceptable levels <24 hours---IS near functional baseline---EXPECTED to return to current living arrangement---NOT newly hypoxic: Does not meet criteria for 5C-Observation unit  Covid Evaluation: Asymptomatic Screening Protocol  (No Symptoms)  Diagnosis: NSTEMI (non-ST elevated myocardial infarction) [facility]) [956213]  Admitting Physician: Vianne Bulls [[id number]]  Attending Physician: Vianne Bulls [[id number]]  PT Class (Do Not Modify): Observation [104]  PT Acc Code (Do Not Modify): Observation [10022]       B Medical/Surgery History Past Medical History:  Diagnosis Date  . Age-related macular degeneration, wet, both eyes ([place])   . Anxiety disorder 07/06/2014  . Arthritis    "all over" (04/27/2018)  . Atrial fibrillation ([place])    catheter ablation of SVT in 2002  . Benign essential HTN 07/06/2014  . Constipation, intermittent 08/04/2007  . CVA (cerebrovascular accident) ([place]) 2003   left brain; denies residual on 04/27/2018  . Depression   . GERD (gastroesophageal reflux disease)   . Heart murmur    hx (04/27/2018)  . Hypothyroidism 09/07/2014   "off RX now" (04/27/2018)  . Iron deficiency anemia   . Iron deficiency anemia, unspecified 01/02/2013  . Osteoarthritis   . Osteoporosis 01/11/2007  . Pneumonia    "one time; years and years ago" (04/27/2018)  . Presence of permanent cardiac pacemaker 04/27/2018    Dual Chamber  . Right BBB/left ant fasc block    Past Surgical History:  Procedure Laterality Date  . APPENDECTOMY    . CATARACT EXTRACTION W/ INTRAOCULAR LENS  IMPLANT, BILATERAL Bilateral   . CHOLECYSTECTOMY OPEN    . COLONOSCOPY    . EYE SURGERY    . FRACTURE SURGERY    . HIP PINNING,CANNULATED Left 07/07/2014   Procedure: CANNULATED HIP PINNING;  Surgeon: Mauri Pole, MD;  Location: [facility];  Service: Orthopedics;  Laterality: Left;  .  INSERT / REPLACE / REMOVE PACEMAKER  04/27/2018    Dual Chamber  . JOINT REPLACEMENT    . LAPAROSCOPIC OVARIAN CYSTECTOMY    . NASAL SEPTUM SURGERY    . PACEMAKER IMPLANT N/A 04/27/2018   Procedure: PACEMAKER IMPLANT - Dual Chamber;  Surgeon: Deboraha Sprang, MD;  Location: [facility];  Service: Cardiovascular;  Laterality: N/A;  . SVT ABLATION   2002  . TONSILLECTOMY    . TOTAL ABDOMINAL HYSTERECTOMY    . TOTAL HIP ARTHROPLASTY Right 02/23/2017   Procedure: RIGHT TOTAL HIP ARTHROPLASTY ANTERIOR APPROACH;  Surgeon: Paralee Cancel, MD;  Location: [facility];  Service: Orthopedics;  Laterality: Right;  . VITRECTOMY Right 2008   dr Zadie Rhine.  Vitrectomy and removal of tissue.      A IV Location/Drains/Wounds Patient Lines/Drains/Airways Status   Active Line/Drains/Airways    Name:   Placement date:   Placement time:   Site:   Days:   Peripheral IV 04/27/18 Anterior;Proximal;Right Forearm   04/27/18    1111    Forearm   278   Peripheral IV 01/30/19 Right Antecubital   01/30/19    1613    Antecubital   less than 1   Incision (Closed) 04/27/18 Chest Left;Upper   04/27/18    1730     278          Intake/Output Last 24 hours No intake or output data in the 24 hours ending 01/30/19 2233  Labs/Imaging Results for orders placed or performed during the hospital encounter of 01/30/19 (from the past 48 hour(s))  CBC with Differential/Platelet     Status: Abnormal   Collection Time: 01/30/19  3:45 PM  Result Value Ref Range   WBC 6.4 4.0 - 10.5 K/uL   RBC 3.97 3.87 - 5.11 MIL/uL   Hemoglobin 12.5 12.0 - 15.0 g/dL   HCT 37.5 36.0 - 46.0 %   MCV 94.5 80.0 - 100.0 fL   MCH 31.5 26.0 - 34.0 pg   MCHC 33.3 30.0 - 36.0 g/dL   RDW 13.0 11.5 - 15.5 %   Platelets 150 150 - 400 K/uL   nRBC 0.0 0.0 - 0.2 %   Neutrophils Relative % 79 %   Neutro Abs 5.1 1.7 - 7.7 K/uL   Lymphocytes Relative 9 %   Lymphs Abs 0.6 (L) 0.7 - 4.0 K/uL   Monocytes Relative 9 %   Monocytes Absolute 0.6 0.1 - 1.0 K/uL   Eosinophils Relative 2 %   Eosinophils Absolute 0.1 0.0 - 0.5 K/uL   Basophils Relative 1 %   Basophils Absolute 0.0 0.0 - 0.1 K/uL   Immature Granulocytes 0 %   Abs Immature Granulocytes 0.02 0.00 - 0.07 K/uL    Comment: Performed at [facility], [address] [place], [place] [address]  Comprehensive metabolic panel     Status: Abnormal    Collection Time: 01/30/19  3:45 PM  Result Value Ref Range   Sodium 134 (L) 135 - 145 mmol/L   Potassium 4.2 3.5 - 5.1 mmol/L   Chloride 98 98 - 111 mmol/L   CO2 25 22 - 32 mmol/L   Glucose, Bld 100 (H) 70 - 99 mg/dL   BUN 15 8 - 23 mg/dL   Creatinine, Ser 0.77 0.44 - 1.00 mg/dL   Calcium 9.4 8.9 - 10.3 mg/dL   Total Protein 6.6 6.5 - 8.1 g/dL   Albumin 3.8 3.5 - 5.0 g/dL   AST 23 15 - 41 U/L  ALT 18 0 - 44 U/L   Alkaline Phosphatase 102 38 - 126 U/L   Total Bilirubin 0.6 0.3 - 1.2 mg/dL   GFR calc non Af Amer >60 >60 mL/min   GFR calc Af Amer >60 >60 mL/min   Anion gap 11 5 - 15    Comment: Performed at [facility] [address] [place], [place] [address]  Troponin I (High Sensitivity)     Status: None   Collection Time: 01/30/19  3:45 PM  Result Value Ref Range   Troponin I (High Sensitivity) 7 <18 ng/L    Comment: (NOTE) Elevated high sensitivity troponin I (hsTnI) values and significant  changes across serial measurements may suggest ACS but many other  chronic and acute conditions are known to elevate hsTnI results.  Refer to the "Links" section for chest pain algorithms and additional  guidance. Performed at [facility], [place] [address], [place], [place] [address]   Lipase, blood     Status: None   Collection Time: 01/30/19  3:45 PM  Result Value Ref Range   Lipase 32 11 - 51 U/L    Comment: Performed at [facility] [address] [place], [place] [address]  Brain natriuretic peptide     Status: Abnormal   Collection Time: 01/30/19  3:45 PM  Result Value Ref Range   B Natriuretic Peptide 168.5 (H) 0.0 - 100.0 pg/mL    Comment: Performed at [facility] [address] [place] [address], [place], [place] [address]  Urinalysis, Routine w reflex microscopic     Status: Abnormal   Collection Time: 01/30/19  4:24 PM  Result Value Ref Range   Color, Urine YELLOW YELLOW   APPearance CLEAR CLEAR   Specific Gravity, Urine 1.009 1.005 - 1.030   pH 6.0 5.0 -  8.0   Glucose, UA NEGATIVE NEGATIVE mg/dL   Hgb urine dipstick NEGATIVE NEGATIVE   Bilirubin Urine NEGATIVE NEGATIVE   Ketones, ur 5 (A) NEGATIVE mg/dL   Protein, ur NEGATIVE NEGATIVE mg/dL   Nitrite NEGATIVE NEGATIVE   Leukocytes,Ua NEGATIVE NEGATIVE    Comment: Performed at [facility] [address], [place], [place] [address]  Troponin I (High Sensitivity)     Status: Abnormal   Collection Time: 01/30/19  7:31 PM  Result Value Ref Range   Troponin I (High Sensitivity) 34 (H) <18 ng/L    Comment: RESULT CALLED TO, READ BACK BY AND VERIFIED WITH: E Erdine Hulen,RN 2040 01/30/2019 WBOND (NOTE) Elevated high sensitivity troponin I (hsTnI) values and significant  changes across serial measurements may suggest ACS but many other  chronic and acute conditions are known to elevate hsTnI results.  Refer to the Links section for chest pain algorithms and additional  guidance. Performed at [facility], [place] [address] [place], [place] [address]    Ct Angio Head W Or Wo Contrast  Result Date: 01/30/2019 CLINICAL DATA:  Headache. Rule out cerebral aneurysm subarachnoid hemorrhage or cerebral vasospasm EXAM: CT ANGIOGRAPHY HEAD AND NECK TECHNIQUE: Multidetector CT imaging of the head and neck was performed using the standard protocol during bolus administration of intravenous contrast. Multiplanar CT image reconstructions and MIPs were obtained to evaluate the vascular anatomy. Carotid stenosis measurements (when applicable) are obtained utilizing NASCET criteria, using the distal internal carotid diameter as the denominator. CONTRAST:  27mL OMNIPAQUE IOHEXOL 350 MG/ML SOLN COMPARISON:  None. FINDINGS: CT HEAD FINDINGS Brain: Mild atrophy. Patchy white matter disease bilaterally is symmetric  and most likely chronic. No acute cortical infarct. Negative for hemorrhage or mass. Vascular: Negative for hyperdense vessel Skull: Negative Sinuses: Mild mucosal edema paranasal sinuses. Negative  orbit Orbits: Review of the MIP images confirms the above findings CTA NECK FINDINGS Aortic arch: Standard branching. Imaged portion shows no evidence of aneurysm or dissection. No significant stenosis of the major arch vessel origins. Left-sided transvenous pacemaker noted. Right carotid system: Right carotid system widely patent without significant stenosis. Mild atherosclerotic calcification right carotid bifurcation Left carotid system: Left carotid system widely patent without stenosis. Mild atherosclerotic calcification left carotid bifurcation Vertebral arteries: Both vertebral arteries patent to the basilar without significant stenosis. Skeleton: Cervical spondylosis. Negative for acute skeletal abnormality. Other neck: Negative for mass or adenopathy Upper chest: Mild apically scarring bilaterally. No acute abnormality. Review of the MIP images confirms the above findings CTA HEAD FINDINGS Anterior circulation: Atherosclerotic calcification in the cavernous carotid bilaterally with mild stenosis. Anterior and middle cerebral arteries patent bilaterally without stenosis occlusion or aneurysm. Posterior circulation: Both vertebral arteries patent to the basilar. Basilar widely patent. PICA patent bilaterally. AICA, and superior cerebellar and posterior cerebral arteries patent bilaterally. Fetal origin left posterior cerebral artery. Venous sinuses: Normal venous enhancement. Left transverse sinus dominant. Review of the MIP images confirms the above findings IMPRESSION: 1. No significant intracranial or extracranial stenosis. Mild atherosclerotic disease carotid bifurcation and cavernous carotid bilaterally. 2. Negative for intracranial large vessel occlusion 3. No acute intracranial abnormality. Negative for acute infarct or hemorrhage. Electronically Signed   By: Franchot Gallo M.D.   On: 01/30/2019 18:35   Ct Angio Neck W And/or Wo Contrast  Result Date: 01/30/2019 CLINICAL DATA:  Headache. Rule out  cerebral aneurysm subarachnoid hemorrhage or cerebral vasospasm EXAM: CT ANGIOGRAPHY HEAD AND NECK TECHNIQUE: Multidetector CT imaging of the head and neck was performed using the standard protocol during bolus administration of intravenous contrast. Multiplanar CT image reconstructions and MIPs were obtained to evaluate the vascular anatomy. Carotid stenosis measurements (when applicable) are obtained utilizing NASCET criteria, using the distal internal carotid diameter as the denominator. CONTRAST:  72mL OMNIPAQUE IOHEXOL 350 MG/ML SOLN COMPARISON:  None. FINDINGS: CT HEAD FINDINGS Brain: Mild atrophy. Patchy white matter disease bilaterally is symmetric and most likely chronic. No acute cortical infarct. Negative for hemorrhage or mass. Vascular: Negative for hyperdense vessel Skull: Negative Sinuses: Mild mucosal edema paranasal sinuses. Negative orbit Orbits: Review of the MIP images confirms the above findings CTA NECK FINDINGS Aortic arch: Standard branching. Imaged portion shows no evidence of aneurysm or dissection. No significant stenosis of the major arch vessel origins. Left-sided transvenous pacemaker noted. Right carotid system: Right carotid system widely patent without significant stenosis. Mild atherosclerotic calcification right carotid bifurcation Left carotid system: Left carotid system widely patent without stenosis. Mild atherosclerotic calcification left carotid bifurcation Vertebral arteries: Both vertebral arteries patent to the basilar without significant stenosis. Skeleton: Cervical spondylosis. Negative for acute skeletal abnormality. Other neck: Negative for mass or adenopathy Upper chest: Mild apically scarring bilaterally. No acute abnormality. Review of the MIP images confirms the above findings CTA HEAD FINDINGS Anterior circulation: Atherosclerotic calcification in the cavernous carotid bilaterally with mild stenosis. Anterior and middle cerebral arteries patent bilaterally without  stenosis occlusion or aneurysm. Posterior circulation: Both vertebral arteries patent to the basilar. Basilar widely patent. PICA patent bilaterally. AICA, and superior cerebellar and posterior cerebral arteries patent bilaterally. Fetal origin left posterior cerebral artery. Venous sinuses: Normal venous enhancement. Left transverse sinus dominant. Review of the MIP images  confirms the above findings IMPRESSION: 1. No significant intracranial or extracranial stenosis. Mild atherosclerotic disease carotid bifurcation and cavernous carotid bilaterally. 2. Negative for intracranial large vessel occlusion 3. No acute intracranial abnormality. Negative for acute infarct or hemorrhage. Electronically Signed   By: Franchot Gallo M.D.   On: 01/30/2019 18:35   Ct Chest W Contrast  Result Date: 01/30/2019 CLINICAL DATA:  Acute onset of chest pain, shortness of breath and generalized weakness that began yesterday. Abnormal chest x-ray questioning a pericardial effusion and possible pneumonia. EXAM: CT CHEST WITH CONTRAST TECHNIQUE: Multidetector CT imaging of the chest was performed during intravenous contrast administration. CONTRAST:  48mL OMNIPAQUE IOHEXOL 350 MG/ML IV. COMPARISON:  No prior chest CT. Multiple prior chest x-rays. FINDINGS: Cardiovascular: Marked cardiomegaly, with massive enlargement of the RIGHT atrium and marked enlargement of the LEFT atrium. No pericardial effusion. Mitral annular calcification. Mild aortic valvular calcification. Moderate LAD and LEFT circumflex coronary atherosclerosis. Moderate atherosclerosis involving the thoracic and proximal abdominal aorta without evidence of aneurysm. Mediastinum/Nodes: No pathologically enlarged mediastinal, hilar or axillary lymph nodes. No mediastinal masses. Normal-appearing esophagus. Lungs/Pleura: Conglomerate scar and bronchiectasis involving the lingula, stable dating back to an October, 2019 chest x-ray. Confluent opacities associated with  nodularity in the RIGHT MIDDLE LOBE. Minimal linear scarring in the LATERAL RIGHT UPPER LOBE, unchanged dating back to 2019. Biapical pleuroparenchymal scarring associated with calcifications. No confluent or ground-glass airspace consolidation elsewhere. No pleural effusions. Central airways patent without significant bronchial wall thickening. Upper Abdomen: Flash enhancement of an approximate 1.2 cm lesion in the ANTERIOR segment RIGHT lobe and a 1.5 cm lesion in the LATERAL segment LEFT lobe. Mild intrahepatic biliary ductal dilation, particularly in the LEFT lobe. Visualized upper abdomen otherwise unremarkable for the early arterial phase of enhancement. Musculoskeletal: Degenerative disc disease and spondylosis involving the visualized LOWER cervical spine and throughout the thoracic spine. No acute findings. IMPRESSION: 1. Conglomerate scar and bronchiectasis involving the lingula, stable dating back to an October, 2019, chest x-ray. 2. Confluent opacities associated with nodularity in the RIGHT MIDDLE LOBE. This may also represent scarring, though an infectious or inflammatory process is also a consideration, potentially MAI. 3. No acute cardiopulmonary disease otherwise. 4. Marked cardiomegaly with massive enlargement of the RIGHT atrium and marked enlargement of the LEFT atrium. This accounts for the chest x-ray finding. 5. Flash enhancement of an approximate 1.2 cm lesion in the ANTERIOR segment RIGHT lobe and a 1.5 cm lesion in the LATERAL segment LEFT lobe of the liver, statistically likely to represent hemangiomas/vascular malformations. 6. Mild intrahepatic biliary ductal dilation, particularly in the LEFT lobe. The extrahepatic bile duct was not imaged. Please correlate with liver function tests. Aortic Atherosclerosis (ICD10-170.0) Electronically Signed   By: Evangeline Dakin M.D.   On: 01/30/2019 18:11   Dg Chest Port 1 View  Result Date: 01/30/2019 CLINICAL DATA:  Congestion, left axillary  chest pain. EXAM: PORTABLE CHEST 1 VIEW COMPARISON:  04/28/2018 FINDINGS: Cardiac pacemaker in stable position. Marked enlargement of the cardiac silhouette, even for portable technique. Mediastinal contours appear intact. Ill-defined airspace opacity in the left mid lung field, likely within the lingula. Nodular opacities in the right lower lung field. Osseous structures are without acute abnormality. Soft tissues are grossly normal. IMPRESSION: 1. Marked enlargement of the cardiac silhouette, even considering portable AP technique. This may represent cardiomyopathy or pericardial effusion. 2. Ill-defined airspace opacity in the left mid lung field, likely within the lingula, progressed from the prior study dated 04/28/2018. 3. Nodular opacities  in the right lower lung field. Electronically Signed   By: Fidela Salisbury M.D.   On: 01/30/2019 15:21    Pending Labs Unresulted Labs (From admission, onward)    Start     Ordered   02/01/19 0500  Heparin level (unfractionated)  Daily,   R     01/30/19 2221   01/31/19 0700  Heparin level (unfractionated)  Once-Timed,   STAT     01/30/19 2221   01/31/19 0500  CBC  Daily,   R     01/30/19 2221   01/30/19 2218  Procalcitonin - Baseline  Once,   STAT     01/30/19 2217   Signed and Held  Basic metabolic panel  Tomorrow morning,   R     Signed and Held   Signed and Held  CBC  Tomorrow morning,   R     Signed and Held   Signed and Held  Expectorated sputum assessment w rflx to resp cult  Once,   R     Signed and Held          Vitals/Pain Today's Vitals   01/30/19 1940 01/30/19 1945 01/30/19 2045 01/30/19 2200  BP: (!) 159/67 (!) 173/76  (!) 176/61  Pulse: (!) 56 (!) 48  (!) 54  Resp: 18 17  16   Temp:      TempSrc:      SpO2: 98% 97%  95%  Weight:      Height:      PainSc:   0-No pain     Isolation Precautions No active isolations  Medications Medications  levofloxacin (LEVAQUIN) IVPB 750 mg (750 mg Intravenous New Bag/Given 01/30/19  2123)  nitroGLYCERIN (NITROSTAT) SL tablet 0.4 mg (has no administration in time range)  metoprolol tartrate (LOPRESSOR) tablet 25 mg (has no administration in time range)  heparin bolus via infusion 2,700 Units (has no administration in time range)  heparin ADULT infusion 100 units/mL (25000 units/252mL sodium chloride 0.45%) (has no administration in time range)  acetaminophen (TYLENOL) tablet 1,000 mg (1,000 mg Oral Given 01/30/19 1614)  metoCLOPramide (REGLAN) injection 10 mg (10 mg Intravenous Given 01/30/19 1615)  diphenhydrAMINE (BENADRYL) injection 12.5 mg (12.5 mg Intravenous Given 01/30/19 1615)  iohexol (OMNIPAQUE) 350 MG/ML injection 75 mL (75 mLs Intravenous Contrast Given 01/30/19 1752)  aspirin tablet 325 mg (325 mg Oral Given 01/30/19 2115)  hydrALAZINE (APRESOLINE) injection 5 mg (5 mg Intravenous Given 01/30/19 2210)    Mobility walks with person assist     Focused Assessments Cardiac Assessment Handoff:    No results found for: CKTOTAL, CKMB, CKMBINDEX, TROPONINI No results found for: DDIMER Does the Patient currently have chest pain? No      R Recommendations: See Admitting Provider Note  Report given to:   Additional Notes:

## 2019-01-30 NOTE — ED Notes (Signed)
Notified EDP of increasing trop.

## 2019-01-30 NOTE — Progress Notes (Signed)
[place] for heparin Indication: chest pain/ACS  Heparin Dosing Weight: 54.4 kg  Labs: Recent Labs    01/30/19 1545  HGB 12.5  HCT 37.5  PLT 150  CREATININE 0.77    Assessment: 27 yof with hx of afib presenting with dizziness, blurry vision, headaches, hypertension, and increased troponin. Pharmacy consulted to dose heparin for ACS. Not on anticoagulation PTA. CBC wnl. No active bleed issues documented.  Goal of Therapy:  Heparin level 0.3-0.7 units/ml Monitor platelets by anticoagulation protocol: Yes   Plan:  Heparin 2700 unit bolus Start heparin at 650 units/h 8h heparin level Daily heparin level/CBC Monitor s/sx bleeding  Elicia Lamp, PharmD, BCPS Clinical Pharmacist 01/30/2019 10:16 PM

## 2019-01-30 NOTE — Progress Notes (Unsigned)
Reviewed pacemaker interrogation data dated 01/30/2019.  There has been a significant decrease in heart rate excursion i.e. slower heart rates since she was started on bisoprolol in February.  I think that this is unlikely to be associated with any acute changes; however, it would be reasonable to decrease her bisoprolol dose down to 2.5 mg (our records suggest she is taking 5 mg)  We will arrange long-term follow-up

## 2019-01-30 NOTE — H&P (Signed)
History and Physical    Megan Olsen EGB:[id number] DOB: 1928/02/20 DOA: 01/30/2019  PCP: Briscoe Deutscher, DO   Patient coming from: Home   Chief Complaint: SOB, chest pressure, gen weakness, left facial pain   HPI: Megan Olsen is a 85 y.o. female with medical history significant for atrial fibrillation on Eliquis, tachybradycardia syndrome with pacer, history of CVA, and anxiety disorder, now presenting to the emergency department for evaluation of shortness of breath, chest pressure, generalized weakness, and left facial pain.  Patient reports that she had recently developed some pressure in the right face and ear that she was attributing to her allergies.  She has gone on to develop dyspnea, mainly with exertion over the past several days.  She has not been coughing much and denies any fevers.  She is also been generally weak, also progressing over the last couple days.  Her shortness of breath and general weakness was much worse today, prompting her presentation.  She has been experiencing some intermittent chest pressure on the left side today.  She has not noted any lower extremity swelling or tenderness.  ED Course: Upon arrival to the ED, patient is found to be afebrile, hypertensive to 190/88, and slightly bradycardic.  EKG features paced rhythm with LVH, IVCD, LAD, and repolarization abnormality.  CTA head and neck is negative for acute findings and CT chest features stable lingular scarring/bronchiectasis and confluent right middle lobe nodular opacities that could reflect scarring or atypical infection.  Troponin was normal initially and increased to 34 when repeated.  Patient was given 300 mg of aspirin, 5 mg IV hydralazine, and Levaquin in the emergency department.  ED physician consulted with cardiology who recommended a medical admission, BP control, and trending of enzymes.  Review of Systems:  All other systems reviewed and apart from HPI, are negative.  Past Medical History:   Diagnosis Date   Age-related macular degeneration, wet, both eyes ([place])    Anxiety disorder 07/06/2014   Arthritis    "all over" (04/27/2018)   Atrial fibrillation ([place])    catheter ablation of SVT in 2002   Benign essential HTN 07/06/2014   Constipation, intermittent 08/04/2007   CVA (cerebrovascular accident) ([place]) 2003   left brain; denies residual on 04/27/2018   Depression    GERD (gastroesophageal reflux disease)    Heart murmur    hx (04/27/2018)   Hypothyroidism 09/07/2014   "off RX now" (04/27/2018)   Iron deficiency anemia    Iron deficiency anemia, unspecified 01/02/2013   Osteoarthritis    Osteoporosis 01/11/2007   Pneumonia    "one time; years and years ago" (04/27/2018)   Presence of permanent cardiac pacemaker 04/27/2018    Dual Chamber   Right BBB/left ant fasc block     Past Surgical History:  Procedure Laterality Date   APPENDECTOMY     CATARACT EXTRACTION W/ INTRAOCULAR LENS  IMPLANT, BILATERAL Bilateral    CHOLECYSTECTOMY OPEN     COLONOSCOPY     EYE SURGERY     FRACTURE SURGERY     HIP PINNING,CANNULATED Left 07/07/2014   Procedure: CANNULATED HIP PINNING;  Surgeon: Mauri Pole, MD;  Location: [facility];  Service: Orthopedics;  Laterality: Left;   INSERT / REPLACE / REMOVE PACEMAKER  04/27/2018    Dual Chamber   JOINT REPLACEMENT     LAPAROSCOPIC OVARIAN CYSTECTOMY     NASAL SEPTUM SURGERY     PACEMAKER IMPLANT N/A 04/27/2018   Procedure: PACEMAKER IMPLANT - Dual Chamber;  Surgeon: Deboraha Sprang, MD;  Location: [facility];  Service: Cardiovascular;  Laterality: N/A;   SVT ABLATION  2002   TONSILLECTOMY     TOTAL ABDOMINAL HYSTERECTOMY     TOTAL HIP ARTHROPLASTY Right 02/23/2017   Procedure: RIGHT TOTAL HIP ARTHROPLASTY ANTERIOR APPROACH;  Surgeon: Paralee Cancel, MD;  Location: [facility];  Service: Orthopedics;  Laterality: Right;   VITRECTOMY Right 2008   dr Zadie Rhine.  Vitrectomy and removal of tissue.       reports that she has never smoked. She has never used smokeless tobacco. She reports current alcohol use. She reports that she does not use drugs.  Allergies  Allergen Reactions   Propoxyphene N-Acetaminophen Nausea Only   Celecoxib Itching and Rash   Fexofenadine Rash   Penicillins Hives and Rash    Has patient had a PCN reaction causing immediate rash, facial/tongue/throat swelling, SOB or lightheadedness with hypotension: Yes Has patient had a PCN reaction causing severe rash involving mucus membranes or skin necrosis: No Has patient had a PCN reaction that required hospitalization: No Has patient had a PCN reaction occurring within the last 10 years: No If all of the above answers are "NO", then may proceed with Cephalosporin use.     Family History  Problem Relation Age of Onset   Early death Mother    Nephritis Mother    Kidney disease Mother    Heart attack Father    Heart disease Sister    Dementia Sister    Heart attack Brother    Dementia Sister      Prior to Admission medications   Medication Sig Start Date End Date Taking? Authorizing Provider  acetaminophen (TYLENOL) 650 MG CR tablet Take 650-1,300 mg by mouth every 8 (eight) hours as needed for pain.    [provider]  apixaban (ELIQUIS) 2.5 MG TABS tablet Take 1 tablet (2.5 mg total) by mouth 2 (two) times daily. 06/03/18   Deboraha Sprang, MD  bisoprolol (ZEBETA) 5 MG tablet Take 1 tablet (5 mg total) by mouth daily. 09/29/18   Deboraha Sprang, MD  Carboxymethylcellulose Sodium (THERATEARS) 0.25 % SOLN Place 1 drop into both eyes 3 (three) times daily as needed (for dry/irritated eyes.).    [provider]  Cholecalciferol (VITAMIN D3) 2000 units TABS Take 2,000 Units by mouth daily.    [provider]  dexlansoprazole (DEXILANT) 60 MG capsule Take 1 capsule (60 mg total) by mouth daily. 04/15/17   Marletta Lor, MD  LORazepam (ATIVAN) 1 MG tablet Take 1 tablet (1 mg  total) by mouth at bedtime. 08/22/18   Briscoe Deutscher, DO  Multiple Vitamins-Minerals (PRESERVISION AREDS 2) CAPS Take 1 capsule by mouth 2 (two) times daily. 04/15/17   Marletta Lor, MD  NON FORMULARY Place 1 Dose into both eyes every 6 (six) weeks. Receives injections into both eyes every 6 weeks at [facility])    [provider]  Syringe, Disposable, (2-3CC SYRINGE) 3 ML MISC 1 Package by Does not apply route every 30 (thirty) days. 09/12/18   Nicholas Lose, MD    Physical Exam: Vitals:   01/30/19 1845 01/30/19 1940 01/30/19 1945 01/30/19 2200  BP: (!) 156/72 (!) 159/67 (!) 173/76 (!) 176/61  Pulse: (!) 50 (!) 56 (!) 48 (!) 54  Resp: 17 18 17 16   Temp:      TempSrc:      SpO2: 96% 98% 97% 95%  Weight:  Height:        Constitutional: NAD, frail  Eyes: PERTLA, lids and conjunctivae normal ENMT: Mucous membranes are moist. Posterior pharynx clear of any exudate or lesions.   Neck: normal, supple, no masses, no thyromegaly Respiratory: no wheezing, no crackles. Normal respiratory effort. No accessory muscle use.  Cardiovascular: Rate ~60 and irregular. No extremity edema.  Abdomen: No distension, no tenderness, soft. Bowel sounds active.  Musculoskeletal: no clubbing / cyanosis. No joint deformity upper and lower extremities.   Skin: no significant rashes, lesions, ulcers. Warm, dry, well-perfused. Neurologic: No facial asymmetry. Sensation intact. Strength 5/5 in all 4 limbs.  Psychiatric: Alert and oriented to person, place, and situation. Pleasant, cooperative.    Labs on Admission: I have personally reviewed following labs and imaging studies  CBC: Recent Labs  Lab 01/30/19 1545  WBC 6.4  NEUTROABS 5.1  HGB 12.5  HCT 37.5  MCV 94.5  PLT 625   Basic Metabolic Panel: Recent Labs  Lab 01/30/19 1545  NA 134*  K 4.2  CL 98  CO2 25  GLUCOSE 100*  BUN 15  CREATININE 0.77  CALCIUM 9.4   GFR: Estimated  Creatinine Clearance: 40.9 mL/min (by C-G formula based on SCr of 0.77 mg/dL). Liver Function Tests: Recent Labs  Lab 01/30/19 1545  AST 23  ALT 18  ALKPHOS 102  BILITOT 0.6  PROT 6.6  ALBUMIN 3.8   Recent Labs  Lab 01/30/19 1545  LIPASE 32   No results for input(s): AMMONIA in the last 168 hours. Coagulation Profile: No results for input(s): INR, PROTIME in the last 168 hours. Cardiac Enzymes: No results for input(s): CKTOTAL, CKMB, CKMBINDEX, TROPONINI in the last 168 hours. BNP (last 3 results) No results for input(s): PROBNP in the last 8760 hours. HbA1C: No results for input(s): HGBA1C in the last 72 hours. CBG: No results for input(s): GLUCAP in the last 168 hours. Lipid Profile: No results for input(s): CHOL, HDL, LDLCALC, TRIG, CHOLHDL, LDLDIRECT in the last 72 hours. Thyroid Function Tests: No results for input(s): TSH, T4TOTAL, FREET4, T3FREE, THYROIDAB in the last 72 hours. Anemia Panel: No results for input(s): VITAMINB12, FOLATE, FERRITIN, TIBC, IRON, RETICCTPCT in the last 72 hours. Urine analysis:    Component Value Date/Time   COLORURINE YELLOW 01/30/2019 1624   APPEARANCEUR CLEAR 01/30/2019 1624   LABSPEC 1.009 01/30/2019 1624   PHURINE 6.0 01/30/2019 1624   GLUCOSEU NEGATIVE 01/30/2019 1624   HGBUR NEGATIVE 01/30/2019 1624   BILIRUBINUR NEGATIVE 01/30/2019 1624   KETONESUR 5 (A) 01/30/2019 1624   PROTEINUR NEGATIVE 01/30/2019 1624   NITRITE NEGATIVE 01/30/2019 1624   LEUKOCYTESUR NEGATIVE 01/30/2019 1624   Sepsis Labs: @LABRCNTIP (procalcitonin:4,lacticidven:4) )No results found for this or any previous visit (from the past 240 hour(s)).   Radiological Exams on Admission: Ct Angio Head W Or Wo Contrast  Result Date: 01/30/2019 CLINICAL DATA:  Headache. Rule out cerebral aneurysm subarachnoid hemorrhage or cerebral vasospasm EXAM: CT ANGIOGRAPHY HEAD AND NECK TECHNIQUE: Multidetector CT imaging of the head and neck was performed using the  standard protocol during bolus administration of intravenous contrast. Multiplanar CT image reconstructions and MIPs were obtained to evaluate the vascular anatomy. Carotid stenosis measurements (when applicable) are obtained utilizing NASCET criteria, using the distal internal carotid diameter as the denominator. CONTRAST:  73mL OMNIPAQUE IOHEXOL 350 MG/ML SOLN COMPARISON:  None. FINDINGS: CT HEAD FINDINGS Brain: Mild atrophy. Patchy white matter disease bilaterally is symmetric and most likely chronic. No acute cortical infarct. Negative for hemorrhage or mass.  Vascular: Negative for hyperdense vessel Skull: Negative Sinuses: Mild mucosal edema paranasal sinuses. Negative orbit Orbits: Review of the MIP images confirms the above findings CTA NECK FINDINGS Aortic arch: Standard branching. Imaged portion shows no evidence of aneurysm or dissection. No significant stenosis of the major arch vessel origins. Left-sided transvenous pacemaker noted. Right carotid system: Right carotid system widely patent without significant stenosis. Mild atherosclerotic calcification right carotid bifurcation Left carotid system: Left carotid system widely patent without stenosis. Mild atherosclerotic calcification left carotid bifurcation Vertebral arteries: Both vertebral arteries patent to the basilar without significant stenosis. Skeleton: Cervical spondylosis. Negative for acute skeletal abnormality. Other neck: Negative for mass or adenopathy Upper chest: Mild apically scarring bilaterally. No acute abnormality. Review of the MIP images confirms the above findings CTA HEAD FINDINGS Anterior circulation: Atherosclerotic calcification in the cavernous carotid bilaterally with mild stenosis. Anterior and middle cerebral arteries patent bilaterally without stenosis occlusion or aneurysm. Posterior circulation: Both vertebral arteries patent to the basilar. Basilar widely patent. PICA patent bilaterally. AICA, and superior cerebellar  and posterior cerebral arteries patent bilaterally. Fetal origin left posterior cerebral artery. Venous sinuses: Normal venous enhancement. Left transverse sinus dominant. Review of the MIP images confirms the above findings IMPRESSION: 1. No significant intracranial or extracranial stenosis. Mild atherosclerotic disease carotid bifurcation and cavernous carotid bilaterally. 2. Negative for intracranial large vessel occlusion 3. No acute intracranial abnormality. Negative for acute infarct or hemorrhage. Electronically Signed   By: Franchot Gallo M.D.   On: 01/30/2019 18:35   Ct Angio Neck W And/or Wo Contrast  Result Date: 01/30/2019 CLINICAL DATA:  Headache. Rule out cerebral aneurysm subarachnoid hemorrhage or cerebral vasospasm EXAM: CT ANGIOGRAPHY HEAD AND NECK TECHNIQUE: Multidetector CT imaging of the head and neck was performed using the standard protocol during bolus administration of intravenous contrast. Multiplanar CT image reconstructions and MIPs were obtained to evaluate the vascular anatomy. Carotid stenosis measurements (when applicable) are obtained utilizing NASCET criteria, using the distal internal carotid diameter as the denominator. CONTRAST:  26mL OMNIPAQUE IOHEXOL 350 MG/ML SOLN COMPARISON:  None. FINDINGS: CT HEAD FINDINGS Brain: Mild atrophy. Patchy white matter disease bilaterally is symmetric and most likely chronic. No acute cortical infarct. Negative for hemorrhage or mass. Vascular: Negative for hyperdense vessel Skull: Negative Sinuses: Mild mucosal edema paranasal sinuses. Negative orbit Orbits: Review of the MIP images confirms the above findings CTA NECK FINDINGS Aortic arch: Standard branching. Imaged portion shows no evidence of aneurysm or dissection. No significant stenosis of the major arch vessel origins. Left-sided transvenous pacemaker noted. Right carotid system: Right carotid system widely patent without significant stenosis. Mild atherosclerotic calcification right  carotid bifurcation Left carotid system: Left carotid system widely patent without stenosis. Mild atherosclerotic calcification left carotid bifurcation Vertebral arteries: Both vertebral arteries patent to the basilar without significant stenosis. Skeleton: Cervical spondylosis. Negative for acute skeletal abnormality. Other neck: Negative for mass or adenopathy Upper chest: Mild apically scarring bilaterally. No acute abnormality. Review of the MIP images confirms the above findings CTA HEAD FINDINGS Anterior circulation: Atherosclerotic calcification in the cavernous carotid bilaterally with mild stenosis. Anterior and middle cerebral arteries patent bilaterally without stenosis occlusion or aneurysm. Posterior circulation: Both vertebral arteries patent to the basilar. Basilar widely patent. PICA patent bilaterally. AICA, and superior cerebellar and posterior cerebral arteries patent bilaterally. Fetal origin left posterior cerebral artery. Venous sinuses: Normal venous enhancement. Left transverse sinus dominant. Review of the MIP images confirms the above findings IMPRESSION: 1. No significant intracranial or extracranial stenosis. Mild atherosclerotic  disease carotid bifurcation and cavernous carotid bilaterally. 2. Negative for intracranial large vessel occlusion 3. No acute intracranial abnormality. Negative for acute infarct or hemorrhage. Electronically Signed   By: Franchot Gallo M.D.   On: 01/30/2019 18:35   Ct Chest W Contrast  Result Date: 01/30/2019 CLINICAL DATA:  Acute onset of chest pain, shortness of breath and generalized weakness that began yesterday. Abnormal chest x-ray questioning a pericardial effusion and possible pneumonia. EXAM: CT CHEST WITH CONTRAST TECHNIQUE: Multidetector CT imaging of the chest was performed during intravenous contrast administration. CONTRAST:  82mL OMNIPAQUE IOHEXOL 350 MG/ML IV. COMPARISON:  No prior chest CT. Multiple prior chest x-rays. FINDINGS:  Cardiovascular: Marked cardiomegaly, with massive enlargement of the RIGHT atrium and marked enlargement of the LEFT atrium. No pericardial effusion. Mitral annular calcification. Mild aortic valvular calcification. Moderate LAD and LEFT circumflex coronary atherosclerosis. Moderate atherosclerosis involving the thoracic and proximal abdominal aorta without evidence of aneurysm. Mediastinum/Nodes: No pathologically enlarged mediastinal, hilar or axillary lymph nodes. No mediastinal masses. Normal-appearing esophagus. Lungs/Pleura: Conglomerate scar and bronchiectasis involving the lingula, stable dating back to an October, 2019 chest x-ray. Confluent opacities associated with nodularity in the RIGHT MIDDLE LOBE. Minimal linear scarring in the LATERAL RIGHT UPPER LOBE, unchanged dating back to 2019. Biapical pleuroparenchymal scarring associated with calcifications. No confluent or ground-glass airspace consolidation elsewhere. No pleural effusions. Central airways patent without significant bronchial wall thickening. Upper Abdomen: Flash enhancement of an approximate 1.2 cm lesion in the ANTERIOR segment RIGHT lobe and a 1.5 cm lesion in the LATERAL segment LEFT lobe. Mild intrahepatic biliary ductal dilation, particularly in the LEFT lobe. Visualized upper abdomen otherwise unremarkable for the early arterial phase of enhancement. Musculoskeletal: Degenerative disc disease and spondylosis involving the visualized LOWER cervical spine and throughout the thoracic spine. No acute findings. IMPRESSION: 1. Conglomerate scar and bronchiectasis involving the lingula, stable dating back to an October, 2019, chest x-ray. 2. Confluent opacities associated with nodularity in the RIGHT MIDDLE LOBE. This may also represent scarring, though an infectious or inflammatory process is also a consideration, potentially MAI. 3. No acute cardiopulmonary disease otherwise. 4. Marked cardiomegaly with massive enlargement of the RIGHT  atrium and marked enlargement of the LEFT atrium. This accounts for the chest x-ray finding. 5. Flash enhancement of an approximate 1.2 cm lesion in the ANTERIOR segment RIGHT lobe and a 1.5 cm lesion in the LATERAL segment LEFT lobe of the liver, statistically likely to represent hemangiomas/vascular malformations. 6. Mild intrahepatic biliary ductal dilation, particularly in the LEFT lobe. The extrahepatic bile duct was not imaged. Please correlate with liver function tests. Aortic Atherosclerosis (ICD10-170.0) Electronically Signed   By: Evangeline Dakin M.D.   On: 01/30/2019 18:11   Dg Chest Port 1 View  Result Date: 01/30/2019 CLINICAL DATA:  Congestion, left axillary chest pain. EXAM: PORTABLE CHEST 1 VIEW COMPARISON:  04/28/2018 FINDINGS: Cardiac pacemaker in stable position. Marked enlargement of the cardiac silhouette, even for portable technique. Mediastinal contours appear intact. Ill-defined airspace opacity in the left mid lung field, likely within the lingula. Nodular opacities in the right lower lung field. Osseous structures are without acute abnormality. Soft tissues are grossly normal. IMPRESSION: 1. Marked enlargement of the cardiac silhouette, even considering portable AP technique. This may represent cardiomyopathy or pericardial effusion. 2. Ill-defined airspace opacity in the left mid lung field, likely within the lingula, progressed from the prior study dated 04/28/2018. 3. Nodular opacities in the right lower lung field. Electronically Signed   By: Fidela Salisbury  M.D.   On: 01/30/2019 15:21    EKG: Independently reviewed. Paced rhythm, IVCD with LVH, repolarization abnormality.   Assessment/Plan   1. Non-STEMI  - Presents with generalized weakness and DOE, reports intermittent chest pressure, and is found to have acute rise in troponin from 7 to 34  - She was treated with ASA 325 mg in ED and cardiology was consulted by ED physician  - Continue cardiac monitoring, continue  anticoagulation with IV heparin, trend troponin, continue beta-blocker as tolerated    2. Atrial fibrillation  - CHADS-VASc is at least 21 (age x2, CVA x2, gender, HTN) - On Eliquis pta, started on IV heparin in ED for possible NSTEMI, continue beta-blocker as tolerated    3. Hypertensive urgency  - SBP was 190's in ED and improved with hydralazine IVP  - Continue HCTZ, continue beta-blocker as HR allows   4. History of CVA  - She reports recent generalized weakness but no focal deficits, CTA head and neck negative for acute findings  - Continue anticoagulation    5. ?Pneumonia  - Nodular opacities involving RML noted on CT, possibly scarring or infectious  - She denies cough or fever/chills, does not have leukocytosis, and does not have tachypnea or hypoxia  - Check procalcitonin and sputum cultures, continue supportive care    PPE: Mask, face shield. Patient wearing mask.  DVT prophylaxis: IV heparin  Code Status: DNR, confirmed with patient  Family Communication: Discussed with patient  Consults called: Cardiology consulted by ED physician  Admission status: Observation    Vianne Bulls, MD [facility] Pager [phone]  If 7PM-7AM, please contact night-coverage [email or web address] Password [facility]  01/30/2019, 10:16 PM

## 2019-01-31 ENCOUNTER — Ambulatory Visit (INDEPENDENT_AMBULATORY_CARE_PROVIDER_SITE_OTHER): Payer: Medicare HMO | Admitting: *Deleted

## 2019-01-31 ENCOUNTER — Inpatient Hospital Stay (HOSPITAL_COMMUNITY)
Admission: EM | Disposition: A | Discharge status: Home Health | Payer: Self-pay | Source: Home / Self Care | Attending: Internal Medicine

## 2019-01-31 ENCOUNTER — Ambulatory Visit: Payer: Medicare HMO | Admitting: Physician Assistant

## 2019-01-31 ENCOUNTER — Telehealth: Payer: Self-pay

## 2019-01-31 DIAGNOSIS — I214 Non-ST elevation (NSTEMI) myocardial infarction: Secondary | ICD-10-CM | POA: Diagnosis present

## 2019-01-31 DIAGNOSIS — I10 Essential (primary) hypertension: Secondary | ICD-10-CM | POA: Diagnosis present

## 2019-01-31 DIAGNOSIS — I495 Sick sinus syndrome: Secondary | ICD-10-CM | POA: Diagnosis not present

## 2019-01-31 DIAGNOSIS — Z8673 Personal history of transient ischemic attack (TIA), and cerebral infarction without residual deficits: Secondary | ICD-10-CM | POA: Diagnosis not present

## 2019-01-31 DIAGNOSIS — D649 Anemia, unspecified: Secondary | ICD-10-CM | POA: Diagnosis present

## 2019-01-31 DIAGNOSIS — I4821 Permanent atrial fibrillation: Secondary | ICD-10-CM | POA: Diagnosis present

## 2019-01-31 DIAGNOSIS — M810 Age-related osteoporosis without current pathological fracture: Secondary | ICD-10-CM | POA: Diagnosis present

## 2019-01-31 DIAGNOSIS — F419 Anxiety disorder, unspecified: Secondary | ICD-10-CM | POA: Diagnosis present

## 2019-01-31 DIAGNOSIS — E039 Hypothyroidism, unspecified: Secondary | ICD-10-CM | POA: Diagnosis present

## 2019-01-31 DIAGNOSIS — Z7901 Long term (current) use of anticoagulants: Secondary | ICD-10-CM | POA: Diagnosis not present

## 2019-01-31 DIAGNOSIS — Z90710 Acquired absence of both cervix and uterus: Secondary | ICD-10-CM | POA: Diagnosis not present

## 2019-01-31 DIAGNOSIS — I2510 Atherosclerotic heart disease of native coronary artery without angina pectoris: Secondary | ICD-10-CM | POA: Diagnosis present

## 2019-01-31 DIAGNOSIS — E876 Hypokalemia: Secondary | ICD-10-CM | POA: Diagnosis present

## 2019-01-31 DIAGNOSIS — K769 Liver disease, unspecified: Secondary | ICD-10-CM | POA: Diagnosis present

## 2019-01-31 DIAGNOSIS — Z96641 Presence of right artificial hip joint: Secondary | ICD-10-CM | POA: Diagnosis present

## 2019-01-31 DIAGNOSIS — D509 Iron deficiency anemia, unspecified: Secondary | ICD-10-CM | POA: Diagnosis present

## 2019-01-31 DIAGNOSIS — Z79899 Other long term (current) drug therapy: Secondary | ICD-10-CM | POA: Diagnosis not present

## 2019-01-31 DIAGNOSIS — Z950 Presence of cardiac pacemaker: Secondary | ICD-10-CM | POA: Diagnosis not present

## 2019-01-31 DIAGNOSIS — Z66 Do not resuscitate: Secondary | ICD-10-CM | POA: Diagnosis present

## 2019-01-31 DIAGNOSIS — Z1159 Encounter for screening for other viral diseases: Secondary | ICD-10-CM | POA: Diagnosis not present

## 2019-01-31 DIAGNOSIS — K219 Gastro-esophageal reflux disease without esophagitis: Secondary | ICD-10-CM | POA: Diagnosis present

## 2019-01-31 DIAGNOSIS — I160 Hypertensive urgency: Secondary | ICD-10-CM | POA: Diagnosis present

## 2019-01-31 HISTORY — PX: LEFT HEART CATH AND CORONARY ANGIOGRAPHY: CATH118249

## 2019-01-31 LAB — CBC
HCT: 35.3 % — ABNORMAL LOW (ref 36.0–46.0)
Hemoglobin: 11.9 g/dL — ABNORMAL LOW (ref 12.0–15.0)
MCH: 31.2 pg (ref 26.0–34.0)
MCHC: 33.7 g/dL (ref 30.0–36.0)
MCV: 92.7 fL (ref 80.0–100.0)
Platelets: 153 10*3/uL (ref 150–400)
RBC: 3.81 MIL/uL — ABNORMAL LOW (ref 3.87–5.11)
RDW: 13.1 % (ref 11.5–15.5)
WBC: 6.2 10*3/uL (ref 4.0–10.5)
nRBC: 0 % (ref 0.0–0.2)

## 2019-01-31 LAB — CUP PACEART REMOTE DEVICE CHECK
Date Time Interrogation Session: 20200714115239
Implantable Lead Implant Date: 20191009
Implantable Lead Location: 753860
Implantable Lead Model: 1948
Implantable Pulse Generator Implant Date: 20191009
Pulse Gen Model: 1272
Pulse Gen Serial Number: 9067558

## 2019-01-31 LAB — TROPONIN I (HIGH SENSITIVITY)
Troponin I (High Sensitivity): 95 ng/L — ABNORMAL HIGH (ref <18)
Troponin I (High Sensitivity): 114 ng/L (ref <18)

## 2019-01-31 LAB — LIPID PANEL
Cholesterol: 148 mg/dL (ref 0–200)
HDL: 66 mg/dL (ref >40)
LDL Cholesterol: 75 mg/dL (ref 0–99)
Total CHOL/HDL Ratio: 2.2 RATIO
Triglycerides: 36 mg/dL (ref <150)
VLDL: 7 mg/dL (ref 0–40)

## 2019-01-31 LAB — BASIC METABOLIC PANEL
Anion gap: 11 (ref 5–15)
BUN: 14 mg/dL (ref 8–23)
CO2: 23 mmol/L (ref 22–32)
Calcium: 9.1 mg/dL (ref 8.9–10.3)
Chloride: 101 mmol/L (ref 98–111)
Creatinine, Ser: 0.84 mg/dL (ref 0.44–1.00)
GFR calc Af Amer: >60 mL/min (ref >60)
GFR calc non Af Amer: >60 mL/min (ref >60)
Glucose, Bld: 87 mg/dL (ref 70–99)
Potassium: 3.8 mmol/L (ref 3.5–5.1)
Sodium: 135 mmol/L (ref 135–145)

## 2019-01-31 LAB — SARS CORONAVIRUS 2 BY RT PCR (HOSPITAL ORDER, PERFORMED IN ~~LOC~~ HOSPITAL LAB): SARS Coronavirus 2: NEGATIVE

## 2019-01-31 LAB — MAGNESIUM: Magnesium: 2 mg/dL (ref 1.7–2.4)

## 2019-01-31 LAB — HEPARIN LEVEL (UNFRACTIONATED): Heparin Unfractionated: 1.3 IU/mL — ABNORMAL HIGH (ref 0.30–0.70)

## 2019-01-31 LAB — APTT: aPTT: 143 seconds — ABNORMAL HIGH (ref 24–36)

## 2019-01-31 LAB — PROCALCITONIN: Procalcitonin: <0.1 ng/mL

## 2019-01-31 LAB — POCT ACTIVATED CLOTTING TIME: Activated Clotting Time: 142 seconds

## 2019-01-31 SURGERY — LEFT HEART CATH AND CORONARY ANGIOGRAPHY
Anesthesia: LOCAL

## 2019-01-31 MED ORDER — AZITHROMYCIN 250 MG PO TABS: 500.0000 mg | ORAL_TABLET | Freq: Every day | ORAL | Status: DC

## 2019-01-31 MED ORDER — FENTANYL CITRATE (PF) 100 MCG/2ML IJ SOLN
INTRAMUSCULAR | Status: DC | PRN
  Administered 2019-01-31: 25 ug via INTRAVENOUS

## 2019-01-31 MED ORDER — LABETALOL HCL 5 MG/ML IV SOLN: 10.0000 mg | INTRAVENOUS | Status: AC | PRN

## 2019-01-31 MED ORDER — SODIUM CHLORIDE 0.9 % WEIGHT BASED INFUSION
3.0000 mL/kg/h | INTRAVENOUS | Status: DC
  Administered 2019-01-31: 3 mL/kg/h via INTRAVENOUS

## 2019-01-31 MED ORDER — SODIUM CHLORIDE 0.9 % IV SOLN: INTRAVENOUS | Status: DC

## 2019-01-31 MED ORDER — ASPIRIN 81 MG PO CHEW: 81.0000 mg | CHEWABLE_TABLET | ORAL | Status: DC

## 2019-01-31 MED ORDER — FENTANYL CITRATE (PF) 100 MCG/2ML IJ SOLN
INTRAMUSCULAR | Status: AC
  Filled 2019-01-31: qty 2

## 2019-01-31 MED ORDER — SODIUM CHLORIDE 0.9% FLUSH: 3.0000 mL | INTRAVENOUS | Status: DC | PRN

## 2019-01-31 MED ORDER — SODIUM CHLORIDE 0.9% FLUSH
3.0000 mL | Freq: Two times a day (BID) | INTRAVENOUS | Status: DC
  Administered 2019-01-31 – 2019-02-01 (×3): 3 mL via INTRAVENOUS

## 2019-01-31 MED ORDER — ACETAMINOPHEN 325 MG PO TABS
650.0000 mg | ORAL_TABLET | ORAL | Status: DC | PRN
  Administered 2019-02-01: 650 mg via ORAL
  Filled 2019-01-31: qty 2

## 2019-01-31 MED ORDER — LIDOCAINE HCL (PF) 1 % IJ SOLN
INTRAMUSCULAR | Status: DC | PRN
  Administered 2019-01-31: 2 mL
  Administered 2019-01-31: 15 mL

## 2019-01-31 MED ORDER — SODIUM CHLORIDE 0.9% FLUSH
3.0000 mL | Freq: Two times a day (BID) | INTRAVENOUS | Status: DC
  Administered 2019-01-31 – 2019-02-01 (×2): 3 mL via INTRAVENOUS

## 2019-01-31 MED ORDER — ASPIRIN EC 81 MG PO TBEC
81.0000 mg | DELAYED_RELEASE_TABLET | Freq: Every day | ORAL | Status: DC
  Administered 2019-01-31: 81 mg via ORAL
  Filled 2019-01-31: qty 1

## 2019-01-31 MED ORDER — HEPARIN (PORCINE) IN NACL 1000-0.9 UT/500ML-% IV SOLN
INTRAVENOUS | Status: AC
  Filled 2019-01-31: qty 500

## 2019-01-31 MED ORDER — HEPARIN SODIUM (PORCINE) 5000 UNIT/ML IJ SOLN: 5000.0000 [IU] | Freq: Three times a day (TID) | INTRAMUSCULAR | Status: DC

## 2019-01-31 MED ORDER — SODIUM CHLORIDE 0.9 % WEIGHT BASED INFUSION: 1.0000 mL/kg/h | INTRAVENOUS | Status: DC

## 2019-01-31 MED ORDER — SODIUM CHLORIDE 0.9 % IV SOLN: 250.0000 mL | INTRAVENOUS | Status: DC | PRN

## 2019-01-31 MED ORDER — LIDOCAINE HCL (PF) 1 % IJ SOLN
INTRAMUSCULAR | Status: AC
  Filled 2019-01-31: qty 30

## 2019-01-31 MED ORDER — BISOPROLOL FUMARATE 5 MG PO TABS
2.5000 mg | ORAL_TABLET | Freq: Every day | ORAL | Status: DC
  Administered 2019-01-31 – 2019-02-01 (×2): 2.5 mg via ORAL
  Filled 2019-01-31 (×2): qty 1

## 2019-01-31 MED ORDER — NITROGLYCERIN 1 MG/10 ML FOR IR/CATH LAB
INTRA_ARTERIAL | Status: AC
  Filled 2019-01-31: qty 10

## 2019-01-31 MED ORDER — MIDAZOLAM HCL 2 MG/2ML IJ SOLN
INTRAMUSCULAR | Status: AC
  Filled 2019-01-31: qty 2

## 2019-01-31 MED ORDER — LORAZEPAM 1 MG PO TABS
1.0000 mg | ORAL_TABLET | Freq: Every day | ORAL | Status: DC
  Administered 2019-01-31 (×2): 1 mg via ORAL
  Filled 2019-01-31 (×2): qty 1

## 2019-01-31 MED ORDER — ASPIRIN 81 MG PO CHEW
81.0000 mg | CHEWABLE_TABLET | ORAL | Status: AC
  Filled 2019-01-31: qty 1

## 2019-01-31 MED ORDER — HYDROCHLOROTHIAZIDE 12.5 MG PO CAPS
12.5000 mg | ORAL_CAPSULE | Freq: Every day | ORAL | Status: DC
  Administered 2019-01-31 – 2019-02-01 (×2): 12.5 mg via ORAL
  Filled 2019-01-31 (×2): qty 1

## 2019-01-31 MED ORDER — HEPARIN (PORCINE) IN NACL 1000-0.9 UT/500ML-% IV SOLN
INTRAVENOUS | Status: DC | PRN
  Administered 2019-01-31 (×2): 500 mL

## 2019-01-31 MED ORDER — ONDANSETRON HCL 4 MG/2ML IJ SOLN: 4.0000 mg | Freq: Four times a day (QID) | INTRAMUSCULAR | Status: DC | PRN

## 2019-01-31 MED ORDER — IOHEXOL 350 MG/ML SOLN
INTRAVENOUS | Status: DC | PRN
  Administered 2019-01-31: 70 mL via INTRA_ARTERIAL

## 2019-01-31 MED ORDER — VERAPAMIL HCL 2.5 MG/ML IV SOLN
INTRAVENOUS | Status: AC
  Filled 2019-01-31: qty 2

## 2019-01-31 MED ORDER — MIDAZOLAM HCL 2 MG/2ML IJ SOLN
INTRAMUSCULAR | Status: DC | PRN
  Administered 2019-01-31: 1 mg via INTRAVENOUS

## 2019-01-31 MED ORDER — APIXABAN 2.5 MG PO TABS
2.5000 mg | ORAL_TABLET | Freq: Two times a day (BID) | ORAL | Status: DC
  Administered 2019-01-31 – 2019-02-01 (×2): 2.5 mg via ORAL
  Filled 2019-01-31 (×2): qty 1

## 2019-01-31 MED ORDER — VERAPAMIL HCL 2.5 MG/ML IV SOLN
INTRAVENOUS | Status: DC | PRN
  Administered 2019-01-31: 10 mL via INTRA_ARTERIAL

## 2019-01-31 MED ORDER — HYDRALAZINE HCL 20 MG/ML IJ SOLN: 10.0000 mg | INTRAMUSCULAR | Status: AC | PRN

## 2019-01-31 SURGICAL SUPPLY — 16 items
CATH INFINITI 5FR MULTPACK ANG (CATHETERS) ×1 IMPLANT
CATH OPTITORQUE TIG 4.0 5F (CATHETERS) ×1 IMPLANT
COVER DOME SNAP 22 D (MISCELLANEOUS) ×1 IMPLANT
DEVICE RAD TR BAND REGULAR (VASCULAR PRODUCTS) ×1 IMPLANT
GLIDESHEATH SLEND A-KIT 6F 22G (SHEATH) ×1 IMPLANT
GUIDEWIRE INQWIRE 1.5J.035X260 (WIRE) IMPLANT
INQWIRE 1.5J .035X260CM (WIRE) ×2
KIT HEART LEFT (KITS) ×2 IMPLANT
PACK CARDIAC CATHETERIZATION (CUSTOM PROCEDURE TRAY) ×2 IMPLANT
SHEATH GLIDE SLENDER 4/5FR (SHEATH) ×1 IMPLANT
SHEATH PROBE COVER 6X72 (BAG) ×1 IMPLANT
SYR MEDRAD MARK 7 150ML (SYRINGE) ×2 IMPLANT
TRANSDUCER W/STOPCOCK (MISCELLANEOUS) ×2 IMPLANT
TUBING CIL FLEX 10 FLL-RA (TUBING) ×2 IMPLANT
WIRE EMERALD 3MM-J .035X150CM (WIRE) ×1 IMPLANT
WIRE HI TORQ VERSACORE-J 145CM (WIRE) ×1 IMPLANT

## 2019-01-31 NOTE — Consult Note (Addendum)
Cardiology Consultation:   Patient ID: Megan Olsen MRN: [id number]; DOB: 1928-03-10  Admit date: 01/30/2019 Date of Consult: 01/31/2019  Primary Care Provider: Briscoe Deutscher, DO Primary Cardiologist: Candee Furbish, MD  Primary Electrophysiologist:  [name], MD    Patient Profile:   Megan Olsen is a 85 y.o. 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 Ree Kida.  History of Present Illness:   [name] has medical history as above. She presented to the ED yesterday with complaints of chest pain, shortness of breath, generalized weakness and left facial pain.   Ms. Wittke tells me that she lives alone and is independent. She does her own housework, cooking, and drives short distances. She says that yesterday she was doing some housework with bending over and rising up when she began to feel weak, short of breath, hot and sweaty. She felt like she was going to black out. She made her way to the couch and elevated her feet. She had chest tightness. She was very lightheaded. She had been having trouble with sinus drainage and ears feeling clogged so she thought that was causing her symptoms. She has had some of these spells, but milder, over the last few weeks. Usually with activity, but sometimes in bed at night. She has had no edema. She has long elevated her head sightly with a beach towel rolled up under the head of her mattress. This is just more comfortable for her.   After about 20 minutes resting on the couch she called and neighbor who became concerned and called EMS. BP was elevated.   She currently denies any chest pain. She thinks she may still have some mild shortness of breath, but not significant.   High sensitivity troponins  are rising with time 7>>114. BNP 168.5 COVID test negative.  SCr 0.77 K+ 4.2 Hgb 11.9 EKG 7/13 V paced, IVCD, consider atypical RBBB, LVH with IVCD, LAD and secondary repol abnrm 7/14 with RBBB and deep TWI in leads V3-6 and inferiorly.   Chest Xray showed marked cardiac enlargement.  CT chest showed marked cardiomegaly, massive enlargement of the right atrium and marked LA enlargement. Mild aortic valve calcification. Moderate LAD and circumflex atherosclerosis. Confluent opacities associated with nodularity in the RIGHT MIDDLE LOBE. This may also represent scarring, though an infectious or inflammatory process is also a consideration, potentially MAI.  CTA neck showed mild carotid atherosclerotic disease. No significant stenosis or occlusion.   Heart Pathway Score:     Past Medical History:  Diagnosis Date   Age-related macular degeneration, wet, both eyes ([place])    Anxiety disorder 07/06/2014   Arthritis    "all over" (04/27/2018)   Atrial fibrillation ([place])    catheter ablation of SVT in 2002   Benign essential HTN 07/06/2014   Constipation, intermittent 08/04/2007   CVA (cerebrovascular accident) ([place]) 2003   left brain; denies residual on 04/27/2018   Depression    GERD (gastroesophageal reflux disease)    Heart murmur    hx (04/27/2018)   Hypothyroidism 09/07/2014   "off RX now" (04/27/2018)   Iron deficiency anemia    Iron deficiency anemia, unspecified 01/02/2013   Osteoarthritis    Osteoporosis 01/11/2007   Pneumonia    "one time; years and years  ago" (04/27/2018)   Presence of permanent cardiac pacemaker 04/27/2018    Dual Chamber   Right BBB/left ant fasc block     Past Surgical History:  Procedure Laterality Date   APPENDECTOMY     CATARACT EXTRACTION W/ INTRAOCULAR LENS  IMPLANT, BILATERAL Bilateral    CHOLECYSTECTOMY OPEN     COLONOSCOPY     EYE SURGERY     FRACTURE SURGERY     HIP PINNING,CANNULATED Left 07/07/2014   Procedure:  CANNULATED HIP PINNING;  Surgeon: Mauri Pole, MD;  Location: [facility];  Service: Orthopedics;  Laterality: Left;   INSERT / REPLACE / REMOVE PACEMAKER  04/27/2018    Dual Chamber   JOINT REPLACEMENT     LAPAROSCOPIC OVARIAN CYSTECTOMY     NASAL SEPTUM SURGERY     PACEMAKER IMPLANT N/A 04/27/2018   Procedure: PACEMAKER IMPLANT - Dual Chamber;  Surgeon: Deboraha Sprang, MD;  Location: [place] [facility];  Service: Cardiovascular;  Laterality: N/A;   SVT ABLATION  2002   TONSILLECTOMY     TOTAL ABDOMINAL HYSTERECTOMY     TOTAL HIP ARTHROPLASTY Right 02/23/2017   Procedure: RIGHT TOTAL HIP ARTHROPLASTY ANTERIOR APPROACH;  Surgeon: Paralee Cancel, MD;  Location: [facility];  Service: Orthopedics;  Laterality: Right;   VITRECTOMY Right 2008   dr Zadie Rhine.  Vitrectomy and removal of tissue.      Home Medications:  Prior to Admission medications   Medication Sig Start Date End Date Taking? Authorizing Provider  acetaminophen (TYLENOL) 650 MG CR tablet Take 650 mg by mouth every 8 (eight) hours as needed for pain.    Yes [provider]  apixaban (ELIQUIS) 2.5 MG TABS tablet Take 1 tablet (2.5 mg total) by mouth 2 (two) times daily. 06/03/18  Yes Deboraha Sprang, MD  bisoprolol (ZEBETA) 5 MG tablet Take 1 tablet (5 mg total) by mouth daily. 09/29/18  Yes Deboraha Sprang, MD  Carboxymethylcellulose Sodium (THERATEARS) 0.25 % SOLN Place 1 drop into both eyes 3 (three) times daily as needed (for dry/irritated eyes.).   Yes [provider]  Cholecalciferol (VITAMIN D3) 2000 units TABS Take 2,000 Units by mouth daily.   Yes [provider]  hydrochlorothiazide (MICROZIDE) 12.5 MG capsule Take 12.5 mg by mouth daily. 11/17/18  Yes [provider]  LORazepam (ATIVAN) 1 MG tablet Take 1 tablet (1 mg total) by mouth at bedtime. 08/22/18  Yes Briscoe Deutscher, DO  Multiple Vitamins-Minerals (PRESERVISION AREDS 2) CAPS Take 1 capsule by mouth 2 (two) times daily. 04/15/17  Yes  Marletta Lor, MD  dexlansoprazole (DEXILANT) 60 MG capsule Take 1 capsule (60 mg total) by mouth daily. Patient not taking: Reported on 01/30/2019 04/15/17   Marletta Lor, MD  NON FORMULARY Place 1 Dose into both eyes See admin instructions. Receives injections into both eyes every 5-8 weeks at Dr. Serita Grit office [facility])    [provider]  Syringe, Disposable, (2-3CC SYRINGE) 3 ML MISC 1 Package by Does not apply route every 30 (thirty) days. 09/12/18   Nicholas Lose, MD    Inpatient Medications: Scheduled Meds:  aspirin EC  81 mg Oral Daily   hydrochlorothiazide  12.5 mg Oral Daily   LORazepam  1 mg Oral QHS   metoprolol tartrate  25 mg Oral BID   Continuous Infusions:  heparin 650 Units/hr (01/31/19 0300)   PRN Meds: acetaminophen, nitroGLYCERIN, ondansetron (ZOFRAN) IV  Allergies:    Allergies  Allergen Reactions   Propoxyphene N-Acetaminophen Nausea  Only   Celecoxib Itching and Rash   Fexofenadine Rash   Penicillins Hives and Rash    Has patient had a PCN reaction causing immediate rash, facial/tongue/throat swelling, SOB or lightheadedness with hypotension: Yes Has patient had a PCN reaction causing severe rash involving mucus membranes or skin necrosis: No Has patient had a PCN reaction that required hospitalization: No Has patient had a PCN reaction occurring within the last 10 years: No If all of the above answers are "NO", then may proceed with Cephalosporin use.     Social History:   Social History   Socioeconomic History   Marital status: Single    Spouse name: Not on file   Number of children: Not on file   Years of education: Not on file   Highest education level: Not on file  Occupational History   Not on file  Social Needs   Financial resource strain: Not on file   Food insecurity    Worry: Not on file    Inability: Not on file   Transportation needs    Medical: Not on file    Non-medical:  Not on file  Tobacco Use   Smoking status: Never Smoker   Smokeless tobacco: Never Used  Substance and Sexual Activity   Alcohol use: Yes    Comment: 04/27/2018 "a beer q 3 months or so; if that"   Drug use: Never   Sexual activity: Not Currently  Lifestyle   Physical activity    Days per week: Not on file    Minutes per session: Not on file   Stress: Not on file  Relationships   Social connections    Talks on phone: Not on file    Gets together: Not on file    Attends religious service: Not on file    Active member of club or organization: Not on file    Attends meetings of clubs or organizations: Not on file    Relationship status: Not on file   Intimate partner violence    Fear of current or ex partner: Not on file    Emotionally abused: Not on file    Physically abused: Not on file    Forced sexual activity: Not on file  Other Topics Concern   Not on file  Social History Narrative   Not on file    Family History:    Family History  Problem Relation Age of Onset   Early death Mother    Nephritis Mother    Kidney disease Mother    Heart attack Father    Heart disease Sister    Dementia Sister    Heart attack Brother    Dementia Sister      ROS:  Please see the history of present illness.   All other ROS reviewed and negative.     Physical Exam/Data:   Vitals:   01/30/19 2200 01/30/19 2230 01/30/19 2316 01/31/19 0618  BP: (!) 176/61 (!) 178/59 (!) 170/70 (!) 128/49  Pulse: (!) 54 61 (!) 56 (!) 54  Resp: 16 17 20 20   Temp:   97.6 F (36.4 C) 97.7 F (36.5 C)  TempSrc:   Oral Oral  SpO2: 95% 96% 99% 95%  Weight:   55.5 kg 55.5 kg  Height:   5\' 4"  (1.626 m)     Intake/Output Summary (Last 24 hours) at 01/31/2019 0847 Last data filed at 01/31/2019 0500 Gross per 24 hour  Intake 340.85 ml  Output 800 ml  Net -459.15  ml   Last 3 Weights 01/31/2019 01/30/2019 01/30/2019  Weight (lbs) 122 lb 5.7 oz 122 lb 5.7 oz 120 lb  Weight (kg)  55.5 kg 55.5 kg 54.432 kg     Body mass index is 21 kg/m.  General:  Thin, frail elderly female, in no acute distress HEENT: normal Lymph: no adenopathy Neck: no JVD Endocrine:  No thryomegaly Vascular: No carotid bruits; FA pulses 2+ bilaterally without bruits  Cardiac:  normal S1, S2; RRR; no murmur, distant heart sounds Lungs:  clear to auscultation bilaterally, no wheezing, rhonchi or rales  Abd: soft, nontender, no hepatomegaly  Ext: no edema Musculoskeletal:  No deformities, BUE and BLE strength normal and equal Skin: warm and dry  Neuro:  CNs 2-12 intact, no focal abnormalities noted Psych:  Normal affect   EKG:  The EKG was personally reviewed and demonstrates:  7/13 V paced, IVCD, consider atypical RBBB, LVH with IVCD, LAD and secondary repol abnrm 7/14 with RBBB and deep TWI in leads V3-6 and inferiorly.   Telemetry:  Telemetry was personally reviewed and demonstrates:  Baseline afib with frequent V pacing, rates 50's-60, briefly up to 90's.   Relevant CV Studies:  Echocardiogram 10/17/2010 Study Conclusions  - Left ventricle: The cavity size was normal. Wall thickness was  normal. Systolic function was normal. The estimated ejection  fraction was in the range of 55% to 60%. Wall motion was normal;  there were no regional wall motion abnormalities.  - Mitral valve: Mild prolapse, involving the anterior leaflet and  the posterior leaflet. Moderate regurgitation.  - Left atrium: The atrium was moderately dilated.  - Right atrium: The atrium was moderately dilated.  - Tricuspid valve: Mild-moderate regurgitation.  - Pulmonary arteries: Systolic pressure was mildly increased.   Impressions: - Calcified papillary muscle. MR is eccentric and directed  posteriorly; at least moderate; suggest TEE to better assess if  clinically indcated.   Laboratory Data:  High Sensitivity Troponin:   Recent Labs  Lab 01/30/19 1545 01/30/19 1931 01/30/19 2149  01/30/19 2343 01/31/19 0113  TROPONINIHS 7 34* 61* 95* 114*     Cardiac EnzymesNo results for input(s): TROPONINI in the last 168 hours. No results for input(s): TROPIPOC in the last 168 hours.  Chemistry Recent Labs  Lab 01/30/19 1545 01/31/19 0113  NA 134* 135  K 4.2 3.8  CL 98 101  CO2 25 23  GLUCOSE 100* 87  BUN 15 14  CREATININE 0.77 0.84  CALCIUM 9.4 9.1  GFRNONAA >60 >60  GFRAA >60 >60  ANIONGAP 11 11    Recent Labs  Lab 01/30/19 1545  PROT 6.6  ALBUMIN 3.8  AST 23  ALT 18  ALKPHOS 102  BILITOT 0.6   Hematology Recent Labs  Lab 01/30/19 1545 01/31/19 0113  WBC 6.4 6.2  RBC 3.97 3.81*  HGB 12.5 11.9*  HCT 37.5 35.3*  MCV 94.5 92.7  MCH 31.5 31.2  MCHC 33.3 33.7  RDW 13.0 13.1  PLT 150 153   BNP Recent Labs  Lab 01/30/19 1545  BNP 168.5*    DDimer No results for input(s): DDIMER in the last 168 hours.   Radiology/Studies:  Ct Angio Head W Or Wo Contrast  Result Date: 01/30/2019 CLINICAL DATA:  Headache. Rule out cerebral aneurysm subarachnoid hemorrhage or cerebral vasospasm EXAM: CT ANGIOGRAPHY HEAD AND NECK TECHNIQUE: Multidetector CT imaging of the head and neck was performed using the standard protocol during bolus administration of intravenous contrast. Multiplanar CT image reconstructions and MIPs were  obtained to evaluate the vascular anatomy. Carotid stenosis measurements (when applicable) are obtained utilizing NASCET criteria, using the distal internal carotid diameter as the denominator. CONTRAST:  58mL OMNIPAQUE IOHEXOL 350 MG/ML SOLN COMPARISON:  None. FINDINGS: CT HEAD FINDINGS Brain: Mild atrophy. Patchy white matter disease bilaterally is symmetric and most likely chronic. No acute cortical infarct. Negative for hemorrhage or mass. Vascular: Negative for hyperdense vessel Skull: Negative Sinuses: Mild mucosal edema paranasal sinuses. Negative orbit Orbits: Review of the MIP images confirms the above findings CTA NECK FINDINGS Aortic  arch: Standard branching. Imaged portion shows no evidence of aneurysm or dissection. No significant stenosis of the major arch vessel origins. Left-sided transvenous pacemaker noted. Right carotid system: Right carotid system widely patent without significant stenosis. Mild atherosclerotic calcification right carotid bifurcation Left carotid system: Left carotid system widely patent without stenosis. Mild atherosclerotic calcification left carotid bifurcation Vertebral arteries: Both vertebral arteries patent to the basilar without significant stenosis. Skeleton: Cervical spondylosis. Negative for acute skeletal abnormality. Other neck: Negative for mass or adenopathy Upper chest: Mild apically scarring bilaterally. No acute abnormality. Review of the MIP images confirms the above findings CTA HEAD FINDINGS Anterior circulation: Atherosclerotic calcification in the cavernous carotid bilaterally with mild stenosis. Anterior and middle cerebral arteries patent bilaterally without stenosis occlusion or aneurysm. Posterior circulation: Both vertebral arteries patent to the basilar. Basilar widely patent. PICA patent bilaterally. AICA, and superior cerebellar and posterior cerebral arteries patent bilaterally. Fetal origin left posterior cerebral artery. Venous sinuses: Normal venous enhancement. Left transverse sinus dominant. Review of the MIP images confirms the above findings IMPRESSION: 1. No significant intracranial or extracranial stenosis. Mild atherosclerotic disease carotid bifurcation and cavernous carotid bilaterally. 2. Negative for intracranial large vessel occlusion 3. No acute intracranial abnormality. Negative for acute infarct or hemorrhage. Electronically Signed   By: Franchot Gallo M.D.   On: 01/30/2019 18:35   Ct Angio Neck W And/or Wo Contrast  Result Date: 01/30/2019 CLINICAL DATA:  Headache. Rule out cerebral aneurysm subarachnoid hemorrhage or cerebral vasospasm EXAM: CT ANGIOGRAPHY HEAD AND  NECK TECHNIQUE: Multidetector CT imaging of the head and neck was performed using the standard protocol during bolus administration of intravenous contrast. Multiplanar CT image reconstructions and MIPs were obtained to evaluate the vascular anatomy. Carotid stenosis measurements (when applicable) are obtained utilizing NASCET criteria, using the distal internal carotid diameter as the denominator. CONTRAST:  50mL OMNIPAQUE IOHEXOL 350 MG/ML SOLN COMPARISON:  None. FINDINGS: CT HEAD FINDINGS Brain: Mild atrophy. Patchy white matter disease bilaterally is symmetric and most likely chronic. No acute cortical infarct. Negative for hemorrhage or mass. Vascular: Negative for hyperdense vessel Skull: Negative Sinuses: Mild mucosal edema paranasal sinuses. Negative orbit Orbits: Review of the MIP images confirms the above findings CTA NECK FINDINGS Aortic arch: Standard branching. Imaged portion shows no evidence of aneurysm or dissection. No significant stenosis of the major arch vessel origins. Left-sided transvenous pacemaker noted. Right carotid system: Right carotid system widely patent without significant stenosis. Mild atherosclerotic calcification right carotid bifurcation Left carotid system: Left carotid system widely patent without stenosis. Mild atherosclerotic calcification left carotid bifurcation Vertebral arteries: Both vertebral arteries patent to the basilar without significant stenosis. Skeleton: Cervical spondylosis. Negative for acute skeletal abnormality. Other neck: Negative for mass or adenopathy Upper chest: Mild apically scarring bilaterally. No acute abnormality. Review of the MIP images confirms the above findings CTA HEAD FINDINGS Anterior circulation: Atherosclerotic calcification in the cavernous carotid bilaterally with mild stenosis. Anterior and middle cerebral arteries patent bilaterally without  stenosis occlusion or aneurysm. Posterior circulation: Both vertebral arteries patent to the  basilar. Basilar widely patent. PICA patent bilaterally. AICA, and superior cerebellar and posterior cerebral arteries patent bilaterally. Fetal origin left posterior cerebral artery. Venous sinuses: Normal venous enhancement. Left transverse sinus dominant. Review of the MIP images confirms the above findings IMPRESSION: 1. No significant intracranial or extracranial stenosis. Mild atherosclerotic disease carotid bifurcation and cavernous carotid bilaterally. 2. Negative for intracranial large vessel occlusion 3. No acute intracranial abnormality. Negative for acute infarct or hemorrhage. Electronically Signed   By: Franchot Gallo M.D.   On: 01/30/2019 18:35   Ct Chest W Contrast  Result Date: 01/30/2019 CLINICAL DATA:  Acute onset of chest pain, shortness of breath and generalized weakness that began yesterday. Abnormal chest x-ray questioning a pericardial effusion and possible pneumonia. EXAM: CT CHEST WITH CONTRAST TECHNIQUE: Multidetector CT imaging of the chest was performed during intravenous contrast administration. CONTRAST:  13mL OMNIPAQUE IOHEXOL 350 MG/ML IV. COMPARISON:  No prior chest CT. Multiple prior chest x-rays. FINDINGS: Cardiovascular: Marked cardiomegaly, with massive enlargement of the RIGHT atrium and marked enlargement of the LEFT atrium. No pericardial effusion. Mitral annular calcification. Mild aortic valvular calcification. Moderate LAD and LEFT circumflex coronary atherosclerosis. Moderate atherosclerosis involving the thoracic and proximal abdominal aorta without evidence of aneurysm. Mediastinum/Nodes: No pathologically enlarged mediastinal, hilar or axillary lymph nodes. No mediastinal masses. Normal-appearing esophagus. Lungs/Pleura: Conglomerate scar and bronchiectasis involving the lingula, stable dating back to an October, 2019 chest x-ray. Confluent opacities associated with nodularity in the RIGHT MIDDLE LOBE. Minimal linear scarring in the LATERAL RIGHT UPPER LOBE,  unchanged dating back to 2019. Biapical pleuroparenchymal scarring associated with calcifications. No confluent or ground-glass airspace consolidation elsewhere. No pleural effusions. Central airways patent without significant bronchial wall thickening. Upper Abdomen: Flash enhancement of an approximate 1.2 cm lesion in the ANTERIOR segment RIGHT lobe and a 1.5 cm lesion in the LATERAL segment LEFT lobe. Mild intrahepatic biliary ductal dilation, particularly in the LEFT lobe. Visualized upper abdomen otherwise unremarkable for the early arterial phase of enhancement. Musculoskeletal: Degenerative disc disease and spondylosis involving the visualized LOWER cervical spine and throughout the thoracic spine. No acute findings. IMPRESSION: 1. Conglomerate scar and bronchiectasis involving the lingula, stable dating back to an October, 2019, chest x-ray. 2. Confluent opacities associated with nodularity in the RIGHT MIDDLE LOBE. This may also represent scarring, though an infectious or inflammatory process is also a consideration, potentially MAI. 3. No acute cardiopulmonary disease otherwise. 4. Marked cardiomegaly with massive enlargement of the RIGHT atrium and marked enlargement of the LEFT atrium. This accounts for the chest x-ray finding. 5. Flash enhancement of an approximate 1.2 cm lesion in the ANTERIOR segment RIGHT lobe and a 1.5 cm lesion in the LATERAL segment LEFT lobe of the liver, statistically likely to represent hemangiomas/vascular malformations. 6. Mild intrahepatic biliary ductal dilation, particularly in the LEFT lobe. The extrahepatic bile duct was not imaged. Please correlate with liver function tests. Aortic Atherosclerosis (ICD10-170.0) Electronically Signed   By: Evangeline Dakin M.D.   On: 01/30/2019 18:11   Dg Chest Port 1 View  Result Date: 01/30/2019 CLINICAL DATA:  Congestion, left axillary chest pain. EXAM: PORTABLE CHEST 1 VIEW COMPARISON:  04/28/2018 FINDINGS: Cardiac pacemaker in  stable position. Marked enlargement of the cardiac silhouette, even for portable technique. Mediastinal contours appear intact. Ill-defined airspace opacity in the left mid lung field, likely within the lingula. Nodular opacities in the right lower lung field. Osseous structures are without acute  abnormality. Soft tissues are grossly normal. IMPRESSION: 1. Marked enlargement of the cardiac silhouette, even considering portable AP technique. This may represent cardiomyopathy or pericardial effusion. 2. Ill-defined airspace opacity in the left mid lung field, likely within the lingula, progressed from the prior study dated 04/28/2018. 3. Nodular opacities in the right lower lung field. Electronically Signed   By: Fidela Salisbury M.D.   On: 01/30/2019 15:21    Assessment and Plan:   NSTEMI -Pt with recent episodes of dizziness, feeling hot and sweaty, and shortness of breath, near syncope.Chest tightness with activity. Has been occurring for a couple of weeks, worse yesterday. BP elevated on presentation. No heart failure type symptoms.  -New deep T wave inversions on EKG, inferolaterally. Unclear significance given presence of PPM with high frequency of V pacing.  -High sensitivity troponins are rising with time 7>>114. -Pt on heparin drip. She was given aspirin 325 mg and started on aspirin 81 mg daily. Last dose of Eliquis was 7/13 am.  -CT chest showed markedly enlarged right and left atria.  -With exertional symptoms, rise in troponins, will take pt for cardiac cath today.   Chronic atrial fibrillation  -CHA2DS2/VAS Stroke Risk Score is 6 (HTN, age (2), CVA (2), female)  She is anticoagulated with Eliquis -reduced dose for age and wt <60 kg, now on hold while on heprain drip.  -Rate controlled on beta blocker.    Hx tachy/brady syndrome -S/P dual chamber PPM followed by Dr. Caryl Comes -Pt has been having issues with dizziness per Dr. Olin Pia office. She has only an RV pacemaker lead and has had  her rate lowered to 55. She has had an increased in V pacing with baseline HR in the 50's. May be contributing to her symptoms. PPM interrogated in ED yesterday with no high HR episodes.   Hypertension -On bisoprolol and HCTZ at home. Now on metoprolol and HCTZ. Was given IV hydralazine.  -BP was initially elevated. Now improved. Continue current therapy.   Anemia -Hgb 11.9. Stable.    Hx CVA -No significant findings on head CT or neck CT, only mild atherosclerotic disease No focal neurologic deficits.     Possible PNA - CTA with scarring vs infection/inflamatory process, potentially MAI. Management per IM.  For questions or updates, please contact [facility] Please consult [email or web address] for contact info under     Signed, Daune Perch, NP  01/31/2019 8:47 AM   Stanford Breed Denice Bors, MD  Physician  Cardiology  Progress Notes  Signed  Date of Service:  01/31/2019 10:24 AM          Signed         Show:Clear all [x] Manual[x] Template[] Copied  Added by: [x] Lelon Perla, MD  [] Hover for details As above, patient seen and examined.  Briefly she is an 85 year old female with past medical history of permanent atrial fibrillation, prior pacemaker, hypertension, prior CVA, hypothyroidism for evaluation of non-ST elevation myocardial infarction.  Patient states that for the past 3 months she has had increased dyspnea on exertion.  No orthopnea, PND, pedal edema.  She has increased diaphoresis with activities and also notes chest tightness with more vigorous activities relieved with rest.  Yesterday while making her bed she had a more severe episode of chest tightness that lasted 20 minutes and then resolved.  She presented to the emergency room and now cardiology asked to evaluate. Troponin-7, 34, 61, 95, 114. Initial electrocardiogram ventricular paced personally reviewed. Follow-up ECG this morning also personally reviewed and shows intermittent ventricular  pacing with right  bundle branch block and deep anterior/inferior T wave inversion.  1 non-ST elevation myocardial infarction-patient symptoms are concerning as she has had exertional chest tightness relieved with rest since April.  More severe and prolonged episode yesterday with elevated troponin and T wave changes on ECG.  Continue aspirin, heparin; change metoprolol to bisoprolol 2.5 mg daily (patient was noted recently to have increased RV pacing).  Add Lipitor 40 mg daily.  Proceed with cardiac catheterization.  The risks and benefits including myocardial infarction, CVA and death discussed and she agrees to proceed.  Last dose of apixaban yesterday morning.  2 permanent atrial fibrillation-CHADSvasc 6; resume apixaban once all procedures complete.  Continue bisoprolol 2.5 mg daily.  3 prior pacemaker-follow-up Dr. Caryl Comes.  4 hypertension-continue present medications and adjust regimen as needed.  5 opacities on chest x-ray-further evaluation per primary care.  Kirk Ruths, MD

## 2019-01-31 NOTE — Interval H&P Note (Signed)
History and Physical Interval Note:  01/31/2019 2:52 PM  Megan Olsen  has presented today for surgery, with the diagnosis of n stemi.  The various methods of treatment have been discussed with the patient and family. After consideration of risks, benefits and other options for treatment, the patient has consented to  Procedure(s): LEFT HEART CATH AND CORONARY ANGIOGRAPHY (N/A)  PERCUTANEOUS CORONARY INTERVENTION  as a surgical intervention.  The patient's history has been reviewed, patient examined, no change in status, stable for surgery.  I have reviewed the patient's chart and labs.  Questions were answered to the patient's satisfaction.    Cath Lab Visit (complete for each Cath Lab visit)  Clinical Evaluation Leading to the Procedure:   ACS: Yes.    Non-ACS:    Anginal Classification: CCS IV  Anti-ischemic medical therapy: Minimal Therapy (1 class of medications)  Non-Invasive Test Results: No non-invasive testing performed  Prior CABG: No previous CABG    Glenetta Hew

## 2019-01-31 NOTE — Progress Notes (Signed)
[facility] for heparin Indication: chest pain/ACS  Heparin Dosing Weight: 54.4 kg  Labs: Recent Labs    01/30/19 1545 01/31/19 0113 01/31/19 0657  HGB 12.5 11.9*  --   HCT 37.5 35.3*  --   PLT 150 153  --   APTT  --   --  143*  HEPARINUNFRC  --   --  1.30*  CREATININE 0.77 0.84  --     Assessment: 24 yof with hx of afib presenting with dizziness, blurry vision, headaches, hypertension, and increased troponin. Pharmacy consulted to dose heparin for ACS. She is on apixaban PTA for afib (last dose on 7/13) -heparin level= 1.3, aPTT= 143 -hg stable  Goal of Therapy:  Heparin level 0.3-0.7 units/ml Monitor platelets by anticoagulation protocol: Yes   Plan:  -Decrease heparin to 400 units/hr -aptt in 8 hours and daily wth CBC daily -daily heparin level  Hildred Laser, PharmD Clinical Pharmacist **Pharmacist phone directory can now be found on [email or web address] ([email or web address]).  Listed under [facility].

## 2019-01-31 NOTE — H&P (View-Only) (Signed)
Error

## 2019-01-31 NOTE — Progress Notes (Addendum)
PROGRESS NOTE    Megan Olsen  FGH:[id number] DOB: 11/22/1927 DOA: 01/30/2019 PCP: Briscoe Deutscher, DO   Brief Narrative:  HPI On 01/30/2019 by Dr. Mitzi Hansen Megan Olsen is a 85 y.o. female with medical history significant for atrial fibrillation on Eliquis, tachybradycardia syndrome with pacer, history of CVA, and anxiety disorder, now presenting to the emergency department for evaluation of shortness of breath, chest pressure, generalized weakness, and left facial pain.  Patient reports that she had recently developed some pressure in the right face and ear that she was attributing to her allergies.  She has gone on to develop dyspnea, mainly with exertion over the past several days.  She has not been coughing much and denies any fevers.  She is also been generally weak, also progressing over the last couple days.  Her shortness of breath and general weakness was much worse today, prompting her presentation.  She has been experiencing some intermittent chest pressure on the left side today.  She has not noted any lower extremity swelling or tenderness.  Interim history Admitted for chest pain found to have elevated troponins.  Cardiology consulted.  Currently on heparin drip. Assessment & Plan   Chest pain/NSTEMI -She presented with chest pain and generalized weakness along with shortness of breath. -Currently chest pain has resolved -On admission, high-sensitivity troponin was 7 however has trended upward to 114 -Cardiology consulted and appreciated -Placed on heparin drip -CT chest: Marked cardiomegaly with massive enlargement of the right atrium and marked enlargement left atrium.  Atrial fibrillation -Eliquis held -CHADSVASC 6 (age, gender, CVA, HTN) -Currently on heparin drip -Currently rate controlled, continue metoprolol (was on bisoprolol at home)  Hypertensive urgency -In the emergency department, SBP was 190s, improved with hydralazine -BP stable -Continue HCTZ,  metoprolol   History of tachybradycardia syndrome -Patient follows with Dr. Caryl Comes, cardiology.  Has pacemaker  History of CVA  -No deficits noted.  Reports generalized weakness -Will need physical therapy and occupational therapy evaluations - however holding off on these until cardiac issues have been addressed -CT head below -Eliquis held  -patient is not on a statin home- unknown as to why- maybe age? -lipid panel pending  Possible pneumonia -Chest x-ray: Noted ill-defined airspace opacity left midlung field. Nodular opacities right lower lung field. -CT chest showed confluent opacities associated with nodularity in the right middle lobe may represent scarring or infectious, inflammatory process, potentially MAI -wanted to place on azithromycin but QTc prolonged -will hold off on treatment for now as there is not a good alternative and patient has no leukocytosis and is afebrile  Liver lesion -CT chest: Enhancement of approximate 1.2 cm lesion in the anterior segment right lobe and 1.5 cm lesion in the lateral segment left lobe of the liver, statistically likely to represent hemangioma/vascular malformation. -Patient also noted to have mild intrahepatic biliary ductal dilatation in the left lobe.  Headache -Patient did complain of headache on presentation however currently denies further headache -CTA head and neck showed no significant intra-or extracranial stenosis.  Mild atherosclerotic disease carotid bifurcation and cavernous carotid bilaterally.  Negative for cranial large vessel occlusion.  No acute intercranial normality.  DVT Prophylaxis  heparin  Code Status: DNR  Family Communication: None at bedside  Disposition Plan: Observation. Given patient's age and current presentation, feel she will need further hospitalization and cardiac workup. Suspect home on discharge.   Consultants Cardiology  Procedures  none  Antibiotics   Anti-infectives (From admission,  onward)   Start  Dose/Rate Route Frequency Ordered Stop   01/30/19 2000  levofloxacin (LEVAQUIN) IVPB 750 mg     750 mg 100 mL/hr over 90 Minutes Intravenous  Once 01/30/19 1948 01/30/19 2253      Subjective:   Raizy Auzenne seen and examined today.  Denies further chest pain but does continue to feel weak with mild shortness of breath.  Denies current abdominal pain, nausea or vomiting, diarrhea or constipation.  Denies current dizziness or headache.  Objective:   Vitals:   01/30/19 2230 01/30/19 2316 01/31/19 0618 01/31/19 0856  BP: (!) 178/59 (!) 170/70 (!) 128/49 123/63  Pulse: 61 (!) 56 (!) 54 63  Resp: 17 20 20    Temp:  97.6 F (36.4 C) 97.7 F (36.5 C)   TempSrc:  Oral Oral   SpO2: 96% 99% 95%   Weight:  55.5 kg 55.5 kg   Height:  5\' 4"  (1.626 m)      Intake/Output Summary (Last 24 hours) at 01/31/2019 0932 Last data filed at 01/31/2019 0500 Gross per 24 hour  Intake 340.85 ml  Output 800 ml  Net -459.15 ml   Filed Weights   01/30/19 1435 01/30/19 2316 01/31/19 0618  Weight: 54.4 kg 55.5 kg 55.5 kg    Exam  General: Well developed, frail, elderly, NAD  HEENT: NCAT,  mucous membranes moist.   Neck: Supple  Cardiovascular: S1 S2 auscultated, irregular  Respiratory: Clear to auscultation bilaterally   Abdomen: Soft, nontender, nondistended, + bowel sounds  Extremities: warm dry without cyanosis clubbing or edema  Neuro: AAOx3, nonfocal  Psych: Normal affect and demeanor with intact judgement and insight   Data Reviewed: I have personally reviewed following labs and imaging studies  CBC: Recent Labs  Lab 01/30/19 1545 01/31/19 0113  WBC 6.4 6.2  NEUTROABS 5.1  --   HGB 12.5 11.9*  HCT 37.5 35.3*  MCV 94.5 92.7  PLT 150 035   Basic Metabolic Panel: Recent Labs  Lab 01/30/19 1545 01/31/19 0113  NA 134* 135  K 4.2 3.8  CL 98 101  CO2 25 23  GLUCOSE 100* 87  BUN 15 14  CREATININE 0.77 0.84  CALCIUM 9.4 9.1   GFR: Estimated  Creatinine Clearance: 39.2 mL/min (by C-G formula based on SCr of 0.84 mg/dL). Liver Function Tests: Recent Labs  Lab 01/30/19 1545  AST 23  ALT 18  ALKPHOS 102  BILITOT 0.6  PROT 6.6  ALBUMIN 3.8   Recent Labs  Lab 01/30/19 1545  LIPASE 32   No results for input(s): AMMONIA in the last 168 hours. Coagulation Profile: No results for input(s): INR, PROTIME in the last 168 hours. Cardiac Enzymes: No results for input(s): CKTOTAL, CKMB, CKMBINDEX, TROPONINI in the last 168 hours. BNP (last 3 results) No results for input(s): PROBNP in the last 8760 hours. HbA1C: No results for input(s): HGBA1C in the last 72 hours. CBG: No results for input(s): GLUCAP in the last 168 hours. Lipid Profile: No results for input(s): CHOL, HDL, LDLCALC, TRIG, CHOLHDL, LDLDIRECT in the last 72 hours. Thyroid Function Tests: No results for input(s): TSH, T4TOTAL, FREET4, T3FREE, THYROIDAB in the last 72 hours. Anemia Panel: No results for input(s): VITAMINB12, FOLATE, FERRITIN, TIBC, IRON, RETICCTPCT in the last 72 hours. Urine analysis:    Component Value Date/Time   COLORURINE YELLOW 01/30/2019 1624   APPEARANCEUR CLEAR 01/30/2019 1624   LABSPEC 1.009 01/30/2019 1624   PHURINE 6.0 01/30/2019 1624   GLUCOSEU NEGATIVE 01/30/2019 1624   HGBUR NEGATIVE 01/30/2019 1624  BILIRUBINUR NEGATIVE 01/30/2019 1624   KETONESUR 5 (A) 01/30/2019 1624   PROTEINUR NEGATIVE 01/30/2019 1624   NITRITE NEGATIVE 01/30/2019 1624   LEUKOCYTESUR NEGATIVE 01/30/2019 1624   Sepsis Labs: @LABRCNTIP (procalcitonin:4,lacticidven:4)  ) Recent Results (from the past 240 hour(s))  SARS Coronavirus 2 (CEPHEID - Performed in [place] hospital lab), Hosp Order     Status: None   Collection Time: 01/30/19 10:52 PM   Specimen: Nasopharyngeal Swab  Result Value Ref Range Status   SARS Coronavirus 2 NEGATIVE NEGATIVE Final    Comment: (NOTE) If result is NEGATIVE SARS-CoV-2 target nucleic acids are NOT DETECTED. The  SARS-CoV-2 RNA is generally detectable in upper and lower  respiratory specimens during the acute phase of infection. The lowest  concentration of SARS-CoV-2 viral copies this assay can detect is 250  copies / mL. A negative result does not preclude SARS-CoV-2 infection  and should not be used as the sole basis for treatment or other  patient management decisions.  A negative result may occur with  improper specimen collection / handling, submission of specimen other  than nasopharyngeal swab, presence of viral mutation(s) within the  areas targeted by this assay, and inadequate number of viral copies  (<250 copies / mL). A negative result must be combined with clinical  observations, patient history, and epidemiological information. If result is POSITIVE SARS-CoV-2 target nucleic acids are DETECTED. The SARS-CoV-2 RNA is generally detectable in upper and lower  respiratory specimens dur ing the acute phase of infection.  Positive  results are indicative of active infection with SARS-CoV-2.  Clinical  correlation with patient history and other diagnostic information is  necessary to determine patient infection status.  Positive results do  not rule out bacterial infection or co-infection with other viruses. If result is PRESUMPTIVE POSTIVE SARS-CoV-2 nucleic acids MAY BE PRESENT.   A presumptive positive result was obtained on the submitted specimen  and confirmed on repeat testing.  While 2019 novel coronavirus  (SARS-CoV-2) nucleic acids may be present in the submitted sample  additional confirmatory testing may be necessary for epidemiological  and / or clinical management purposes  to differentiate between  SARS-CoV-2 and other Sarbecovirus currently known to infect humans.  If clinically indicated additional testing with an alternate test  methodology [phone]) is advised. The SARS-CoV-2 RNA is generally  detectable in upper and lower respiratory sp ecimens during the acute    phase of infection. The expected result is Negative. Fact Sheet for Patients:  StrictlyIdeas.no Fact Sheet for Healthcare Providers: BankingDealers.co.za This test is not yet approved or cleared by the [place] FDA and has been authorized for detection and/or diagnosis of SARS-CoV-2 by FDA under an Emergency Use Authorization (EUA).  This EUA will remain in effect (meaning this test can be used) for the duration of the COVID-19 declaration under Section 564(b)(1) of the Act, 21 U.S.C. section 360bbb-3(b)(1), unless the authorization is terminated or revoked sooner. Performed at [facility], [place] [address], [place], [place] [address]       Radiology Studies: Ct Angio Head W Or Wo Contrast  Result Date: 01/30/2019 CLINICAL DATA:  Headache. Rule out cerebral aneurysm subarachnoid hemorrhage or cerebral vasospasm EXAM: CT ANGIOGRAPHY HEAD AND NECK TECHNIQUE: Multidetector CT imaging of the head and neck was performed using the standard protocol during bolus administration of intravenous contrast. Multiplanar CT image reconstructions and MIPs were obtained to evaluate the vascular anatomy. Carotid stenosis measurements (when applicable) are obtained utilizing NASCET criteria, using the distal  internal carotid diameter as the denominator. CONTRAST:  81mL OMNIPAQUE IOHEXOL 350 MG/ML SOLN COMPARISON:  None. FINDINGS: CT HEAD FINDINGS Brain: Mild atrophy. Patchy white matter disease bilaterally is symmetric and most likely chronic. No acute cortical infarct. Negative for hemorrhage or mass. Vascular: Negative for hyperdense vessel Skull: Negative Sinuses: Mild mucosal edema paranasal sinuses. Negative orbit Orbits: Review of the MIP images confirms the above findings CTA NECK FINDINGS Aortic arch: Standard branching. Imaged portion shows no evidence of aneurysm or dissection. No significant stenosis of the major arch vessel origins. Left-sided  transvenous pacemaker noted. Right carotid system: Right carotid system widely patent without significant stenosis. Mild atherosclerotic calcification right carotid bifurcation Left carotid system: Left carotid system widely patent without stenosis. Mild atherosclerotic calcification left carotid bifurcation Vertebral arteries: Both vertebral arteries patent to the basilar without significant stenosis. Skeleton: Cervical spondylosis. Negative for acute skeletal abnormality. Other neck: Negative for mass or adenopathy Upper chest: Mild apically scarring bilaterally. No acute abnormality. Review of the MIP images confirms the above findings CTA HEAD FINDINGS Anterior circulation: Atherosclerotic calcification in the cavernous carotid bilaterally with mild stenosis. Anterior and middle cerebral arteries patent bilaterally without stenosis occlusion or aneurysm. Posterior circulation: Both vertebral arteries patent to the basilar. Basilar widely patent. PICA patent bilaterally. AICA, and superior cerebellar and posterior cerebral arteries patent bilaterally. Fetal origin left posterior cerebral artery. Venous sinuses: Normal venous enhancement. Left transverse sinus dominant. Review of the MIP images confirms the above findings IMPRESSION: 1. No significant intracranial or extracranial stenosis. Mild atherosclerotic disease carotid bifurcation and cavernous carotid bilaterally. 2. Negative for intracranial large vessel occlusion 3. No acute intracranial abnormality. Negative for acute infarct or hemorrhage. Electronically Signed   By: Franchot Gallo M.D.   On: 01/30/2019 18:35   Ct Angio Neck W And/or Wo Contrast  Result Date: 01/30/2019 CLINICAL DATA:  Headache. Rule out cerebral aneurysm subarachnoid hemorrhage or cerebral vasospasm EXAM: CT ANGIOGRAPHY HEAD AND NECK TECHNIQUE: Multidetector CT imaging of the head and neck was performed using the standard protocol during bolus administration of intravenous  contrast. Multiplanar CT image reconstructions and MIPs were obtained to evaluate the vascular anatomy. Carotid stenosis measurements (when applicable) are obtained utilizing NASCET criteria, using the distal internal carotid diameter as the denominator. CONTRAST:  40mL OMNIPAQUE IOHEXOL 350 MG/ML SOLN COMPARISON:  None. FINDINGS: CT HEAD FINDINGS Brain: Mild atrophy. Patchy white matter disease bilaterally is symmetric and most likely chronic. No acute cortical infarct. Negative for hemorrhage or mass. Vascular: Negative for hyperdense vessel Skull: Negative Sinuses: Mild mucosal edema paranasal sinuses. Negative orbit Orbits: Review of the MIP images confirms the above findings CTA NECK FINDINGS Aortic arch: Standard branching. Imaged portion shows no evidence of aneurysm or dissection. No significant stenosis of the major arch vessel origins. Left-sided transvenous pacemaker noted. Right carotid system: Right carotid system widely patent without significant stenosis. Mild atherosclerotic calcification right carotid bifurcation Left carotid system: Left carotid system widely patent without stenosis. Mild atherosclerotic calcification left carotid bifurcation Vertebral arteries: Both vertebral arteries patent to the basilar without significant stenosis. Skeleton: Cervical spondylosis. Negative for acute skeletal abnormality. Other neck: Negative for mass or adenopathy Upper chest: Mild apically scarring bilaterally. No acute abnormality. Review of the MIP images confirms the above findings CTA HEAD FINDINGS Anterior circulation: Atherosclerotic calcification in the cavernous carotid bilaterally with mild stenosis. Anterior and middle cerebral arteries patent bilaterally without stenosis occlusion or aneurysm. Posterior circulation: Both vertebral arteries patent to the basilar. Basilar widely patent. PICA patent bilaterally.  AICA, and superior cerebellar and posterior cerebral arteries patent bilaterally. Fetal  origin left posterior cerebral artery. Venous sinuses: Normal venous enhancement. Left transverse sinus dominant. Review of the MIP images confirms the above findings IMPRESSION: 1. No significant intracranial or extracranial stenosis. Mild atherosclerotic disease carotid bifurcation and cavernous carotid bilaterally. 2. Negative for intracranial large vessel occlusion 3. No acute intracranial abnormality. Negative for acute infarct or hemorrhage. Electronically Signed   By: Franchot Gallo M.D.   On: 01/30/2019 18:35   Ct Chest W Contrast  Result Date: 01/30/2019 CLINICAL DATA:  Acute onset of chest pain, shortness of breath and generalized weakness that began yesterday. Abnormal chest x-ray questioning a pericardial effusion and possible pneumonia. EXAM: CT CHEST WITH CONTRAST TECHNIQUE: Multidetector CT imaging of the chest was performed during intravenous contrast administration. CONTRAST:  35mL OMNIPAQUE IOHEXOL 350 MG/ML IV. COMPARISON:  No prior chest CT. Multiple prior chest x-rays. FINDINGS: Cardiovascular: Marked cardiomegaly, with massive enlargement of the RIGHT atrium and marked enlargement of the LEFT atrium. No pericardial effusion. Mitral annular calcification. Mild aortic valvular calcification. Moderate LAD and LEFT circumflex coronary atherosclerosis. Moderate atherosclerosis involving the thoracic and proximal abdominal aorta without evidence of aneurysm. Mediastinum/Nodes: No pathologically enlarged mediastinal, hilar or axillary lymph nodes. No mediastinal masses. Normal-appearing esophagus. Lungs/Pleura: Conglomerate scar and bronchiectasis involving the lingula, stable dating back to an October, 2019 chest x-ray. Confluent opacities associated with nodularity in the RIGHT MIDDLE LOBE. Minimal linear scarring in the LATERAL RIGHT UPPER LOBE, unchanged dating back to 2019. Biapical pleuroparenchymal scarring associated with calcifications. No confluent or ground-glass airspace consolidation  elsewhere. No pleural effusions. Central airways patent without significant bronchial wall thickening. Upper Abdomen: Flash enhancement of an approximate 1.2 cm lesion in the ANTERIOR segment RIGHT lobe and a 1.5 cm lesion in the LATERAL segment LEFT lobe. Mild intrahepatic biliary ductal dilation, particularly in the LEFT lobe. Visualized upper abdomen otherwise unremarkable for the early arterial phase of enhancement. Musculoskeletal: Degenerative disc disease and spondylosis involving the visualized LOWER cervical spine and throughout the thoracic spine. No acute findings. IMPRESSION: 1. Conglomerate scar and bronchiectasis involving the lingula, stable dating back to an October, 2019, chest x-ray. 2. Confluent opacities associated with nodularity in the RIGHT MIDDLE LOBE. This may also represent scarring, though an infectious or inflammatory process is also a consideration, potentially MAI. 3. No acute cardiopulmonary disease otherwise. 4. Marked cardiomegaly with massive enlargement of the RIGHT atrium and marked enlargement of the LEFT atrium. This accounts for the chest x-ray finding. 5. Flash enhancement of an approximate 1.2 cm lesion in the ANTERIOR segment RIGHT lobe and a 1.5 cm lesion in the LATERAL segment LEFT lobe of the liver, statistically likely to represent hemangiomas/vascular malformations. 6. Mild intrahepatic biliary ductal dilation, particularly in the LEFT lobe. The extrahepatic bile duct was not imaged. Please correlate with liver function tests. Aortic Atherosclerosis (ICD10-170.0) Electronically Signed   By: Evangeline Dakin M.D.   On: 01/30/2019 18:11   Dg Chest Port 1 View  Result Date: 01/30/2019 CLINICAL DATA:  Congestion, left axillary chest pain. EXAM: PORTABLE CHEST 1 VIEW COMPARISON:  04/28/2018 FINDINGS: Cardiac pacemaker in stable position. Marked enlargement of the cardiac silhouette, even for portable technique. Mediastinal contours appear intact. Ill-defined airspace  opacity in the left mid lung field, likely within the lingula. Nodular opacities in the right lower lung field. Osseous structures are without acute abnormality. Soft tissues are grossly normal. IMPRESSION: 1. Marked enlargement of the cardiac silhouette, even considering portable AP technique.  This may represent cardiomyopathy or pericardial effusion. 2. Ill-defined airspace opacity in the left mid lung field, likely within the lingula, progressed from the prior study dated 04/28/2018. 3. Nodular opacities in the right lower lung field. Electronically Signed   By: Fidela Salisbury M.D.   On: 01/30/2019 15:21     Scheduled Meds:  aspirin EC  81 mg Oral Daily   hydrochlorothiazide  12.5 mg Oral Daily   LORazepam  1 mg Oral QHS   metoprolol tartrate  25 mg Oral BID   Continuous Infusions:  heparin 650 Units/hr (01/31/19 0300)     LOS: 0 days   Time Spent in minutes   45 minutes  Josefina Rynders D.O. on 01/31/2019 at 9:32 AM  Between 7am to 7pm - Please see pager noted on [email or web address]  After 7pm go to [email or web address]  And look for the night coverage person covering for me after hours  [facility] Office  [phone]

## 2019-01-31 NOTE — Telephone Encounter (Signed)
Pt was admitted in the hospital for syncope. The Pa, Gae Bon had questions and I let her talk to [place], [place]

## 2019-01-31 NOTE — Progress Notes (Addendum)
Error

## 2019-01-31 NOTE — Telephone Encounter (Signed)
Spoke with Katherine Mantle. Transmission recived was from 01/24/19 and transmission recorded at High HR but no EGM was available to determine rhythm. Gae Bon stated she may have industry assess device at [facility]. Advised pt has [employer] Jude device.

## 2019-01-31 NOTE — Progress Notes (Signed)
Lab reported critical Troponin 114- Pt is asymptomatic- will continue to monitor

## 2019-02-01 ENCOUNTER — Telehealth: Payer: Self-pay

## 2019-02-01 ENCOUNTER — Encounter (HOSPITAL_COMMUNITY): Payer: Self-pay | Admitting: Cardiology

## 2019-02-01 LAB — HEPATIC FUNCTION PANEL
ALT: 23 U/L (ref 0–44)
AST: 26 U/L (ref 15–41)
Albumin: 3.7 g/dL (ref 3.5–5.0)
Alkaline Phosphatase: 117 U/L (ref 38–126)
Bilirubin, Direct: 0.2 mg/dL (ref 0.0–0.2)
Indirect Bilirubin: 0.5 mg/dL (ref 0.3–0.9)
Total Bilirubin: 0.7 mg/dL (ref 0.3–1.2)
Total Protein: 6.7 g/dL (ref 6.5–8.1)

## 2019-02-01 LAB — BASIC METABOLIC PANEL
Anion gap: 12 (ref 5–15)
BUN: 14 mg/dL (ref 8–23)
CO2: 22 mmol/L (ref 22–32)
Calcium: 8.7 mg/dL — ABNORMAL LOW (ref 8.9–10.3)
Chloride: 102 mmol/L (ref 98–111)
Creatinine, Ser: 0.94 mg/dL (ref 0.44–1.00)
GFR calc Af Amer: >60 mL/min (ref >60)
GFR calc non Af Amer: 54 mL/min — ABNORMAL LOW (ref >60)
Glucose, Bld: 86 mg/dL (ref 70–99)
Potassium: 3.4 mmol/L — ABNORMAL LOW (ref 3.5–5.1)
Sodium: 136 mmol/L (ref 135–145)

## 2019-02-01 LAB — CBC
HCT: 34.6 % — ABNORMAL LOW (ref 36.0–46.0)
Hemoglobin: 11.6 g/dL — ABNORMAL LOW (ref 12.0–15.0)
MCH: 31.6 pg (ref 26.0–34.0)
MCHC: 33.5 g/dL (ref 30.0–36.0)
MCV: 94.3 fL (ref 80.0–100.0)
Platelets: 131 10*3/uL — ABNORMAL LOW (ref 150–400)
RBC: 3.67 MIL/uL — ABNORMAL LOW (ref 3.87–5.11)
RDW: 13.1 % (ref 11.5–15.5)
WBC: 5.7 10*3/uL (ref 4.0–10.5)
nRBC: 0 % (ref 0.0–0.2)

## 2019-02-01 MED ORDER — LOSARTAN POTASSIUM 25 MG PO TABS: 25.0000 mg | ORAL_TABLET | Freq: Every day | ORAL | 3 refills | Status: DC

## 2019-02-01 MED ORDER — NITROGLYCERIN 0.4 MG SL SUBL: 0.4000 mg | SUBLINGUAL_TABLET | SUBLINGUAL | 0 refills | Status: DC | PRN

## 2019-02-01 MED ORDER — BISOPROLOL FUMARATE 5 MG PO TABS: 2.5000 mg | ORAL_TABLET | Freq: Every day | ORAL | Status: DC

## 2019-02-01 MED ORDER — POTASSIUM CHLORIDE CRYS ER 20 MEQ PO TBCR
40.0000 meq | EXTENDED_RELEASE_TABLET | Freq: Once | ORAL | Status: AC
  Administered 2019-02-01: 09:00:00 40 meq via ORAL
  Filled 2019-02-01: qty 2

## 2019-02-01 MED ORDER — LOSARTAN POTASSIUM 25 MG PO TABS
25.0000 mg | ORAL_TABLET | Freq: Every day | ORAL | Status: DC
  Administered 2019-02-01: 25 mg via ORAL
  Filled 2019-02-01: qty 1

## 2019-02-01 MED FILL — Heparin Sod (Porcine)-NaCl IV Soln 1000 Unit/500ML-0.9%: INTRAVENOUS | Qty: 500 | Status: AC

## 2019-02-01 NOTE — Discharge Summary (Signed)
Physician Discharge Summary  Megan Olsen STM:[id number] DOB: [birth year] DOA: 01/30/2019  PCP: Briscoe Deutscher, DO  Admit date: 01/30/2019 Discharge date: 02/01/2019  Time spent: 35 minutes  Recommendations for Outpatient Follow-up:  1. Needs rpt ECHO to be arranged by Cardiology--CC Dr. Hampton Abbot for OP follow up 2. Patient should be educated more Re: Takotsubo, relation to stress etc etc--this is not a benign entity 3. Labs 1 week 4. cxr in 2-3 weeks  Discharge Diagnoses:  Principal Problem:   NSTEMI (non-ST elevated myocardial infarction) ([place]) Active Problems:   A-fib ([place]), s/p catheter ablation of SVT in 2002   Anxiety disorder   History of CVA (cerebrovascular accident), without residual effects   Tachy-brady syndrome ([place])   Hypertensive urgency   Discharge Condition: improved  Diet recommendation:  hh low salt  Filed Weights   01/30/19 2316 01/31/19 0618 02/01/19 0300  Weight: 55.5 kg 55.5 kg 54.7 kg    History of present illness:  [name] afib CHad>6, Tachy-brady, CVA, Bipolar admit 7/13 w [facility] +DOE + overall weak--has had this insidiously for padt 3 mo-found to have HTN urgency on admit Seen by Cardiology as Trop 7---->114, placed on Heparin GTT CT chest cardiomeg +RAE  Hospital Course:  Admitted-Underwent work-up by Cardiology and eventual Cardiac cath as below 7/14 meds adjusted--[changed to bisoprolol 2.5], lipitor added   SHe had per my read atelectasisa on cxr---no need abx---requires rpt CXR 2-3 weeks  Procedures: Conclusion of CATH 7/14    There is mild left ventricular systolic dysfunction -with apical ballooning in Takotsubo pattern. The left ventricular ejection fraction is 45-50% by visual estimate.  LV end diastolic pressure is normal.  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      Consultations:  cardiology  Discharge Exam: Vitals:   01/31/19 2234 02/01/19 0300  BP: (!) 152/82 (!) 150/72  Pulse: 64 60  Resp: 20 18  Temp: 97.7 F (36.5 C) 97.8 F (36.6 C)  SpO2: 95% 95%    General: awake alert pleasant--I wanna go home--ate 1/2 bfast, no n, nbo cp Cardiovascular: s1 s2 paced with PVC Respiratory: clear no added sound no rales no rhoinchio abd soft No le edema  Discharge Instructions   Discharge Instructions    Diet - low sodium heart healthy   Complete by: As directed    Discharge instructions   Complete by: As directed    notice addition of a medication for your heart--you didn't have a heart attack but you DO require a repeat echo of your heart in about 8 weeks Some doses of your medications have changed---look carefully at your new med list please Get labs in 1 week at regular Dr office   Increase activity slowly   Complete by: As directed      Allergies as of 02/01/2019      Reactions   Propoxyphene N-acetaminophen Nausea Only   Celecoxib Itching, Rash   Fexofenadine Rash   Penicillins Hives, Rash   Has patient had a PCN reaction causing immediate rash, facial/tongue/throat swelling, SOB or lightheadedness with hypotension: Yes Has patient had a PCN reaction causing severe rash involving mucus membranes or skin necrosis: No Has patient had a PCN reaction that required hospitalization: No Has patient had a PCN reaction occurring within the last 10 years: No If all of the above answers are "NO", then may proceed  with Cephalosporin use.      Medication List    STOP taking these medications   2-3CC SYRINGE 3 ML Misc     TAKE these medications   acetaminophen 650 MG CR tablet Commonly known as: TYLENOL Take 650 mg by mouth every 8 (eight) hours as needed for pain.   apixaban 2.5 MG Tabs tablet Commonly known as: Eliquis Take 1 tablet (2.5 mg total) by mouth 2 (two) times daily.   bisoprolol  5 MG tablet Commonly known as: ZEBETA Take 0.5 tablets (2.5 mg total) by mouth daily. Start taking on: February 02, 2019 What changed: how much to take   dexlansoprazole 60 MG capsule Commonly known as: Dexilant Take 1 capsule (60 mg total) by mouth daily.   hydrochlorothiazide 12.5 MG capsule Commonly known as: MICROZIDE Take 12.5 mg by mouth daily.   LORazepam 1 MG tablet Commonly known as: ATIVAN Take 1 tablet (1 mg total) by mouth at bedtime.   losartan 25 MG tablet Commonly known as: COZAAR Take 1 tablet (25 mg total) by mouth daily. Start taking on: February 02, 2019   nitroGLYCERIN 0.4 MG SL tablet Commonly known as: NITROSTAT Place 1 tablet (0.4 mg total) under the tongue every 5 (five) minutes x 3 doses as needed for chest pain.   NON FORMULARY Place 1 Dose into both eyes See admin instructions. Receives injections into both eyes every 5-8 weeks at Dr. Serita Grit office [facility])   PreserVision AREDS 2 Caps Take 1 capsule by mouth 2 (two) times daily.   Theratears 0.25 % Soln Generic drug: Carboxymethylcellulose Sodium Place 1 drop into both eyes 3 (three) times daily as needed (for dry/irritated eyes.).   Vitamin D3 50 MCG (2000 UT) Tabs Take 2,000 Units by mouth daily.      Allergies  Allergen Reactions  . Propoxyphene N-Acetaminophen Nausea Only  . Celecoxib Itching and Rash  . Fexofenadine Rash  . Penicillins Hives and Rash    Has patient had a PCN reaction causing immediate rash, facial/tongue/throat swelling, SOB or lightheadedness with hypotension: Yes Has patient had a PCN reaction causing severe rash involving mucus membranes or skin necrosis: No Has patient had a PCN reaction that required hospitalization: No Has patient had a PCN reaction occurring within the last 10 years: No If all of the above answers are "NO", then may proceed with Cephalosporin use.       The results of significant diagnostics from this hospitalization  (including imaging, microbiology, ancillary and laboratory) are listed below for reference.    Significant Diagnostic Studies: Ct Angio Head W Or Wo Contrast  Result Date: 01/30/2019 CLINICAL DATA:  Headache. Rule out cerebral aneurysm subarachnoid hemorrhage or cerebral vasospasm EXAM: CT ANGIOGRAPHY HEAD AND NECK TECHNIQUE: Multidetector CT imaging of the head and neck was performed using the standard protocol during bolus administration of intravenous contrast. Multiplanar CT image reconstructions and MIPs were obtained to evaluate the vascular anatomy. Carotid stenosis measurements (when applicable) are obtained utilizing NASCET criteria, using the distal internal carotid diameter as the denominator. CONTRAST:  10mL OMNIPAQUE IOHEXOL 350 MG/ML SOLN COMPARISON:  None. FINDINGS: CT HEAD FINDINGS Brain: Mild atrophy. Patchy white matter disease bilaterally is symmetric and most likely chronic. No acute cortical infarct. Negative for hemorrhage or mass. Vascular: Negative for hyperdense vessel Skull: Negative Sinuses: Mild mucosal edema paranasal sinuses. Negative orbit Orbits: Review of the MIP images confirms the above findings CTA NECK FINDINGS Aortic arch: Standard branching. Imaged portion shows no evidence of aneurysm  or dissection. No significant stenosis of the major arch vessel origins. Left-sided transvenous pacemaker noted. Right carotid system: Right carotid system widely patent without significant stenosis. Mild atherosclerotic calcification right carotid bifurcation Left carotid system: Left carotid system widely patent without stenosis. Mild atherosclerotic calcification left carotid bifurcation Vertebral arteries: Both vertebral arteries patent to the basilar without significant stenosis. Skeleton: Cervical spondylosis. Negative for acute skeletal abnormality. Other neck: Negative for mass or adenopathy Upper chest: Mild apically scarring bilaterally. No acute abnormality. Review of the MIP  images confirms the above findings CTA HEAD FINDINGS Anterior circulation: Atherosclerotic calcification in the cavernous carotid bilaterally with mild stenosis. Anterior and middle cerebral arteries patent bilaterally without stenosis occlusion or aneurysm. Posterior circulation: Both vertebral arteries patent to the basilar. Basilar widely patent. PICA patent bilaterally. AICA, and superior cerebellar and posterior cerebral arteries patent bilaterally. Fetal origin left posterior cerebral artery. Venous sinuses: Normal venous enhancement. Left transverse sinus dominant. Review of the MIP images confirms the above findings IMPRESSION: 1. No significant intracranial or extracranial stenosis. Mild atherosclerotic disease carotid bifurcation and cavernous carotid bilaterally. 2. Negative for intracranial large vessel occlusion 3. No acute intracranial abnormality. Negative for acute infarct or hemorrhage. Electronically Signed   By: Franchot Gallo M.D.   On: 01/30/2019 18:35   Ct Angio Neck W And/or Wo Contrast  Result Date: 01/30/2019 CLINICAL DATA:  Headache. Rule out cerebral aneurysm subarachnoid hemorrhage or cerebral vasospasm EXAM: CT ANGIOGRAPHY HEAD AND NECK TECHNIQUE: Multidetector CT imaging of the head and neck was performed using the standard protocol during bolus administration of intravenous contrast. Multiplanar CT image reconstructions and MIPs were obtained to evaluate the vascular anatomy. Carotid stenosis measurements (when applicable) are obtained utilizing NASCET criteria, using the distal internal carotid diameter as the denominator. CONTRAST:  62mL OMNIPAQUE IOHEXOL 350 MG/ML SOLN COMPARISON:  None. FINDINGS: CT HEAD FINDINGS Brain: Mild atrophy. Patchy white matter disease bilaterally is symmetric and most likely chronic. No acute cortical infarct. Negative for hemorrhage or mass. Vascular: Negative for hyperdense vessel Skull: Negative Sinuses: Mild mucosal edema paranasal sinuses.  Negative orbit Orbits: Review of the MIP images confirms the above findings CTA NECK FINDINGS Aortic arch: Standard branching. Imaged portion shows no evidence of aneurysm or dissection. No significant stenosis of the major arch vessel origins. Left-sided transvenous pacemaker noted. Right carotid system: Right carotid system widely patent without significant stenosis. Mild atherosclerotic calcification right carotid bifurcation Left carotid system: Left carotid system widely patent without stenosis. Mild atherosclerotic calcification left carotid bifurcation Vertebral arteries: Both vertebral arteries patent to the basilar without significant stenosis. Skeleton: Cervical spondylosis. Negative for acute skeletal abnormality. Other neck: Negative for mass or adenopathy Upper chest: Mild apically scarring bilaterally. No acute abnormality. Review of the MIP images confirms the above findings CTA HEAD FINDINGS Anterior circulation: Atherosclerotic calcification in the cavernous carotid bilaterally with mild stenosis. Anterior and middle cerebral arteries patent bilaterally without stenosis occlusion or aneurysm. Posterior circulation: Both vertebral arteries patent to the basilar. Basilar widely patent. PICA patent bilaterally. AICA, and superior cerebellar and posterior cerebral arteries patent bilaterally. Fetal origin left posterior cerebral artery. Venous sinuses: Normal venous enhancement. Left transverse sinus dominant. Review of the MIP images confirms the above findings IMPRESSION: 1. No significant intracranial or extracranial stenosis. Mild atherosclerotic disease carotid bifurcation and cavernous carotid bilaterally. 2. Negative for intracranial large vessel occlusion 3. No acute intracranial abnormality. Negative for acute infarct or hemorrhage. Electronically Signed   By: Franchot Gallo M.D.   On: 01/30/2019 18:35  Ct Chest W Contrast  Result Date: 01/30/2019 CLINICAL DATA:  Acute onset of chest pain,  shortness of breath and generalized weakness that began yesterday. Abnormal chest x-ray questioning a pericardial effusion and possible pneumonia. EXAM: CT CHEST WITH CONTRAST TECHNIQUE: Multidetector CT imaging of the chest was performed during intravenous contrast administration. CONTRAST:  36mL OMNIPAQUE IOHEXOL 350 MG/ML IV. COMPARISON:  No prior chest CT. Multiple prior chest x-rays. FINDINGS: Cardiovascular: Marked cardiomegaly, with massive enlargement of the RIGHT atrium and marked enlargement of the LEFT atrium. No pericardial effusion. Mitral annular calcification. Mild aortic valvular calcification. Moderate LAD and LEFT circumflex coronary atherosclerosis. Moderate atherosclerosis involving the thoracic and proximal abdominal aorta without evidence of aneurysm. Mediastinum/Nodes: No pathologically enlarged mediastinal, hilar or axillary lymph nodes. No mediastinal masses. Normal-appearing esophagus. Lungs/Pleura: Conglomerate scar and bronchiectasis involving the lingula, stable dating back to an October, 2019 chest x-ray. Confluent opacities associated with nodularity in the RIGHT MIDDLE LOBE. Minimal linear scarring in the LATERAL RIGHT UPPER LOBE, unchanged dating back to 2019. Biapical pleuroparenchymal scarring associated with calcifications. No confluent or ground-glass airspace consolidation elsewhere. No pleural effusions. Central airways patent without significant bronchial wall thickening. Upper Abdomen: Flash enhancement of an approximate 1.2 cm lesion in the ANTERIOR segment RIGHT lobe and a 1.5 cm lesion in the LATERAL segment LEFT lobe. Mild intrahepatic biliary ductal dilation, particularly in the LEFT lobe. Visualized upper abdomen otherwise unremarkable for the early arterial phase of enhancement. Musculoskeletal: Degenerative disc disease and spondylosis involving the visualized LOWER cervical spine and throughout the thoracic spine. No acute findings. IMPRESSION: 1. Conglomerate scar  and bronchiectasis involving the lingula, stable dating back to an October, 2019, chest x-ray. 2. Confluent opacities associated with nodularity in the RIGHT MIDDLE LOBE. This may also represent scarring, though an infectious or inflammatory process is also a consideration, potentially MAI. 3. No acute cardiopulmonary disease otherwise. 4. Marked cardiomegaly with massive enlargement of the RIGHT atrium and marked enlargement of the LEFT atrium. This accounts for the chest x-ray finding. 5. Flash enhancement of an approximate 1.2 cm lesion in the ANTERIOR segment RIGHT lobe and a 1.5 cm lesion in the LATERAL segment LEFT lobe of the liver, statistically likely to represent hemangiomas/vascular malformations. 6. Mild intrahepatic biliary ductal dilation, particularly in the LEFT lobe. The extrahepatic bile duct was not imaged. Please correlate with liver function tests. Aortic Atherosclerosis (ICD10-170.0) Electronically Signed   By: Evangeline Dakin M.D.   On: 01/30/2019 18:11   Dg Chest Port 1 View  Result Date: 01/30/2019 CLINICAL DATA:  Congestion, left axillary chest pain. EXAM: PORTABLE CHEST 1 VIEW COMPARISON:  04/28/2018 FINDINGS: Cardiac pacemaker in stable position. Marked enlargement of the cardiac silhouette, even for portable technique. Mediastinal contours appear intact. Ill-defined airspace opacity in the left mid lung field, likely within the lingula. Nodular opacities in the right lower lung field. Osseous structures are without acute abnormality. Soft tissues are grossly normal. IMPRESSION: 1. Marked enlargement of the cardiac silhouette, even considering portable AP technique. This may represent cardiomyopathy or pericardial effusion. 2. Ill-defined airspace opacity in the left mid lung field, likely within the lingula, progressed from the prior study dated 04/28/2018. 3. Nodular opacities in the right lower lung field. Electronically Signed   By: Fidela Salisbury M.D.   On: 01/30/2019 15:21     Microbiology: Recent Results (from the past 240 hour(s))  SARS Coronavirus 2 (CEPHEID - Performed in [facility] hospital lab), [facility] Order     Status: None   Collection  Time: 01/30/19 10:52 PM   Specimen: Nasopharyngeal Swab  Result Value Ref Range Status   SARS Coronavirus 2 NEGATIVE NEGATIVE Final    Comment: (NOTE) If result is NEGATIVE SARS-CoV-2 target nucleic acids are NOT DETECTED. The SARS-CoV-2 RNA is generally detectable in upper and lower  respiratory specimens during the acute phase of infection. The lowest  concentration of SARS-CoV-2 viral copies this assay can detect is 250  copies / mL. A negative result does not preclude SARS-CoV-2 infection  and should not be used as the sole basis for treatment or other  patient management decisions.  A negative result may occur with  improper specimen collection / handling, submission of specimen other  than nasopharyngeal swab, presence of viral mutation(s) within the  areas targeted by this assay, and inadequate number of viral copies  (<250 copies / mL). A negative result must be combined with clinical  observations, patient history, and epidemiological information. If result is POSITIVE SARS-CoV-2 target nucleic acids are DETECTED. The SARS-CoV-2 RNA is generally detectable in upper and lower  respiratory specimens dur ing the acute phase of infection.  Positive  results are indicative of active infection with SARS-CoV-2.  Clinical  correlation with patient history and other diagnostic information is  necessary to determine patient infection status.  Positive results do  not rule out bacterial infection or co-infection with other viruses. If result is PRESUMPTIVE POSTIVE SARS-CoV-2 nucleic acids MAY BE PRESENT.   A presumptive positive result was obtained on the submitted specimen  and confirmed on repeat testing.  While 2019 novel coronavirus  (SARS-CoV-2) nucleic acids may be present in the submitted sample   additional confirmatory testing may be necessary for epidemiological  and / or clinical management purposes  to differentiate between  SARS-CoV-2 and other Sarbecovirus currently known to infect humans.  If clinically indicated additional testing with an alternate test  methodology [phone]) is advised. The SARS-CoV-2 RNA is generally  detectable in upper and lower respiratory sp ecimens during the acute  phase of infection. The expected result is Negative. Fact Sheet for Patients:  StrictlyIdeas.no Fact Sheet for Healthcare Providers: BankingDealers.co.za This test is not yet approved or cleared by the [place] FDA and has been authorized for detection and/or diagnosis of SARS-CoV-2 by FDA under an Emergency Use Authorization (EUA).  This EUA will remain in effect (meaning this test can be used) for the duration of the COVID-19 declaration under Section 564(b)(1) of the Act, 21 U.S.C. section 360bbb-3(b)(1), unless the authorization is terminated or revoked sooner. Performed at [facility], [place] [address] [place], [place] [address]      Labs: Basic Metabolic Panel: Recent Labs  Lab 01/30/19 1545 01/31/19 0113 02/01/19 0528  NA 134* 135 136  K 4.2 3.8 3.4*  CL 98 101 102  CO2 25 23 22   GLUCOSE 100* 87 86  BUN 15 14 14   CREATININE 0.77 0.84 0.94  CALCIUM 9.4 9.1 8.7*  MG  --  2.0  --    Liver Function Tests: Recent Labs  Lab 01/30/19 1545  AST 23  ALT 18  ALKPHOS 102  BILITOT 0.6  PROT 6.6  ALBUMIN 3.8   Recent Labs  Lab 01/30/19 1545  LIPASE 32   No results for input(s): AMMONIA in the last 168 hours. CBC: Recent Labs  Lab 01/30/19 1545 01/31/19 0113 02/01/19 0528  WBC 6.4 6.2 5.7  NEUTROABS 5.1  --   --   HGB 12.5 11.9* 11.6*  HCT 37.5  35.3* 34.6*  MCV 94.5 92.7 94.3  PLT 150 153 131*   Cardiac Enzymes: No results for input(s): CKTOTAL, CKMB, CKMBINDEX, TROPONINI in the last 168  hours. BNP: BNP (last 3 results) Recent Labs    01/30/19 1545  BNP 168.5*    ProBNP (last 3 results) No results for input(s): PROBNP in the last 8760 hours.  CBG: No results for input(s): GLUCAP in the last 168 hours.     Signed:  Nita Sells MD   [facility] 02/01/2019, 9:00 AM

## 2019-02-01 NOTE — Plan of Care (Signed)

## 2019-02-01 NOTE — Progress Notes (Signed)
   Pt already discharged. Message sent to [place] to arrange for appt with Dr. Caryl Comes in 2-4 weeks.   Daune Perch, AGNP-C [facility] 02/01/2019  1:06 PM Pager: [phone]

## 2019-02-01 NOTE — Evaluation (Signed)
Physical Therapy Evaluation Patient Details Name: Megan Olsen MRN: [id number] DOB: 1928-02-25 Today's Date: 02/01/2019   History of Present Illness  85 yo female with onset of NSTEMI was admitted and received heart cath, noted no atherosclerosis in heart but did have in carotids.  Has Takotsubo CM, bronchiectasis, a-fib, stress cardiomyopathy, and cardiomegaly with hemangiomas in liver and lungs.  PMHx:  pacemaker, CVA, MI and L hip ORIF  Clinical Impression  Pt was seen for mobility and noted both an elevation in HR and changes in gait that indicate a change in L hip after history of ORIF.  Pt is appropriate to follow up with HHPT and have asked MD for same, needs no new equipment.  Will not follow acutely as pt is leaving imminently, and will have HHPT follow with her PCP.      Follow Up Recommendations Home health PT;Supervision - Intermittent    Equipment Recommendations  None recommended by PT(has RW and cane at home if needed)    Recommendations for Other Services       Precautions / Restrictions Precautions Precautions: Fall;ICD/Pacemaker Precaution Comments: monitor telemetry Restrictions Weight Bearing Restrictions: No      Mobility  Bed Mobility Overal bed mobility: Modified Independent                Transfers Overall transfer level: Modified independent Equipment used: None             General transfer comment: pt did not need PT to help her power up  Ambulation/Gait Ambulation/Gait assistance: Min guard(mainly for safety) Gait Distance (Feet): 120 Feet Assistive device: None Gait Pattern/deviations: Step-through pattern;Wide base of support;Drifts right/left;Decreased stride length Gait velocity: reduced Gait velocity interpretation: <1.31 ft/sec, indicative of household ambulator General Gait Details: has list mildly to L from trunk level indicative of degenerative changes in L hip  Stairs            Wheelchair Mobility    Modified  [place] (Stroke Patients Only)       Balance Overall balance assessment: Needs assistance Sitting-balance support: Feet supported Sitting balance-Leahy Scale: Good       Standing balance-Leahy Scale: Fair Standing balance comment: fair for controlled gait                             Pertinent Vitals/Pain Pain Assessment: No/denies pain    Home Living Family/patient expects to be discharged to:: Private residence Living Arrangements: Alone Available Help at Discharge: Family;Available PRN/intermittently Type of Home: Apartment Home Access: Level entry     Home Layout: One level Home Equipment: Walker - 2 wheels;Cane - single point;Shower seat Additional Comments: was not reporting any falls in the last several months    Prior Function Level of Independence: Independent         Comments: likely was holding onto furniture     Hand Dominance   Dominant Hand: Right    Extremity/Trunk Assessment   Upper Extremity Assessment Upper Extremity Assessment: Overall WFL for tasks assessed    Lower Extremity Assessment Lower Extremity Assessment: Generalized weakness    Cervical / Trunk Assessment Cervical / Trunk Assessment: Kyphotic  Communication   Communication: HOH  Cognition Arousal/Alertness: Awake/alert Behavior During Therapy: WFL for tasks assessed/performed Overall Cognitive Status: Within Functional Limits for tasks assessed  General Comments: pt is aware that she has issues with surgical leg although the procedure was a long time ago      General Comments General comments (skin integrity, edema, etc.): pt is showing signs of degenerative changes in L hip, but is also able to walk with careful speed.  Pt has likely been furniture walking at home, and will benefit from a short course of PT to increase her safety and recover control of listing in gait    Exercises     Assessment/Plan    PT  Assessment Patient needs continued PT services  PT Problem List Decreased strength;Decreased range of motion;Decreased activity tolerance;Decreased balance;Decreased mobility;Decreased coordination;Decreased knowledge of use of DME;Decreased safety awareness;Cardiopulmonary status limiting activity       PT Treatment Interventions DME instruction;Gait training;Functional mobility training;Therapeutic activities;Therapeutic exercise;Balance training;Neuromuscular re-education;Patient/family education    PT Goals (Current goals can be found in the Care Plan section)  Acute Rehab PT Goals Patient Stated Goal: to get home today PT Goal Formulation: With patient Time For Goal Achievement: 02/08/19 Potential to Achieve Goals: Good    Frequency     Barriers to discharge Decreased caregiver support home alone with tendency to list to L in trunk    Co-evaluation               AM-PAC PT "6 Clicks" Mobility  Outcome Measure Help needed turning from your back to your side while in a flat bed without using bedrails?: None Help needed moving from lying on your back to sitting on the side of a flat bed without using bedrails?: A Little Help needed moving to and from a bed to a chair (including a wheelchair)?: A Little Help needed standing up from a chair using your arms (e.g., wheelchair or bedside chair)?: A Little Help needed to walk in hospital room?: A Little Help needed climbing 3-5 steps with a railing? : A Lot 6 Click Score: 18    End of Session Equipment Utilized During Treatment: Gait belt Activity Tolerance: Patient limited by fatigue;Treatment limited secondary to medical complications (Comment)(pulse up to 129 with gait) Patient left: in bed;with call bell/phone within reach;Other (comment)(sitting on side of bed) Nurse Communication: Mobility status;Other (comment)(asked MD for HHPT order) PT Visit Diagnosis: Unsteadiness on feet (R26.81);Difficulty in walking, not elsewhere  classified (R26.2);Muscle weakness (generalized) (M62.81);Ataxic gait (R26.0)    Time: 8916-9450 PT Time Calculation (min) (ACUTE ONLY): 21 min   Charges:   PT Evaluation $PT Eval Moderate Complexity: 1 Mod         Ramond Dial 02/01/2019, 12:17 PM   Mee Hives, PT MS Acute Rehab Dept. Number: [place]

## 2019-02-01 NOTE — TOC Transition Note (Signed)
Transition of Care [facility]) - CM/SW Discharge Note   Patient Details  Name: Megan Olsen MRN: [id number] Date of Birth: [birth year]  [facility] CM/SW Contact:  Bethena Roys, RN Phone Number: 02/01/2019, 10:25 AM   Clinical Narrative: Pt presented for NStemi- PTA from home and will need [facility] RN for disease and medication management-PT for deconditioning. CM did offer choice and patient chose [facility]. CM did make referral with Tommi Rumps- SOC to begin within 24-48 hours post transition home. Agency to call the patient. Hospital follow up appointments placed on AVS. No further needs from CM at this time.       Final next level of care: [place] Barriers to Discharge: No Barriers Identified   Patient Goals and CMS Choice Patient states their goals for this hospitalization and ongoing recovery are:: "to return home to be able to get back to baseline" CMS Medicare.gov Compare Post Acute Care list provided to:: Patient Choice offered to / list presented to : Patient  Discharge Placement                       Discharge Plan and Services In-house Referral: NA Discharge Planning Services: CM Consult Post Acute Care Choice: Home Health                    HH Arranged: RN, PT, Disease Management [place] Agency: [place] Date [place]: 02/01/19 Time [place]: 2694 Representative spoke with at [place]: [place] ([place]) Interventions     Readmission Risk Interventions No flowsheet data found.

## 2019-02-01 NOTE — Telephone Encounter (Signed)
LM for patient to return call for TCM. 

## 2019-02-01 NOTE — Progress Notes (Signed)
Progress Note  Patient Name: Megan Olsen Date of Encounter: 02/01/2019  Primary Cardiologist: Candee Furbish, MD   Subjective   No CP or dyspnea  Inpatient Medications    Scheduled Meds:  apixaban  2.5 mg Oral BID   aspirin EC  81 mg Oral Daily   bisoprolol  2.5 mg Oral Daily   hydrochlorothiazide  12.5 mg Oral Daily   LORazepam  1 mg Oral QHS   sodium chloride flush  3 mL Intravenous Q12H   sodium chloride flush  3 mL Intravenous Q12H   Continuous Infusions:  sodium chloride     PRN Meds: sodium chloride, acetaminophen, nitroGLYCERIN, ondansetron (ZOFRAN) IV, sodium chloride flush   Vital Signs    Vitals:   01/31/19 1656 01/31/19 1711 01/31/19 2234 02/01/19 0300  BP: 132/68 (!) 143/59 (!) 152/82 (!) 150/72  Pulse: (!) 54 (!) 55 64 60  Resp:   20 18  Temp:   97.7 F (36.5 C) 97.8 F (36.6 C)  TempSrc:   Oral Oral  SpO2: 98% 95% 95% 95%  Weight:    54.7 kg  Height:        Intake/Output Summary (Last 24 hours) at 02/01/2019 0757 Last data filed at 02/01/2019 0300 Gross per 24 hour  Intake 1428.21 ml  Output --  Net 1428.21 ml   Last 3 Weights 02/01/2019 01/31/2019 01/30/2019  Weight (lbs) 120 lb 9.5 oz 122 lb 5.7 oz 122 lb 5.7 oz  Weight (kg) 54.7 kg 55.5 kg 55.5 kg      Telemetry    Atrial fibrillation with intermittent ventricular pacing; NSVT - Personally Reviewed  Physical Exam   GEN: No acute distress.   Neck: No JVD Cardiac: RRR, no murmurs, rubs, or gallops.  Respiratory: Clear to auscultation bilaterally. GI: Soft, nontender, non-distended  MS: No edema; radial cath site with no hematoma Neuro:  Nonfocal  Psych: Normal affect   Labs    High Sensitivity Troponin:   Recent Labs  Lab 01/30/19 1545 01/30/19 1931 01/30/19 2149 01/30/19 2343 01/31/19 0113  TROPONINIHS 7 34* 61* 95* 114*      Chemistry Recent Labs  Lab 01/30/19 1545 01/31/19 0113 02/01/19 0528  NA 134* 135 136  K 4.2 3.8 3.4*  CL 98 101 102  CO2 25 23  22   GLUCOSE 100* 87 86  BUN 15 14 14   CREATININE 0.77 0.84 0.94  CALCIUM 9.4 9.1 8.7*  PROT 6.6  --   --   ALBUMIN 3.8  --   --   AST 23  --   --   ALT 18  --   --   ALKPHOS 102  --   --   BILITOT 0.6  --   --   GFRNONAA >60 >60 54*  GFRAA >60 >60 >60  ANIONGAP 11 11 12      Hematology Recent Labs  Lab 01/30/19 1545 01/31/19 0113 02/01/19 0528  WBC 6.4 6.2 5.7  RBC 3.97 3.81* 3.67*  HGB 12.5 11.9* 11.6*  HCT 37.5 35.3* 34.6*  MCV 94.5 92.7 94.3  MCH 31.5 31.2 31.6  MCHC 33.3 33.7 33.5  RDW 13.0 13.1 13.1  PLT 150 153 131*    BNP Recent Labs  Lab 01/30/19 1545  BNP 168.5*     Radiology    Ct Angio Head W Or Wo Contrast  Result Date: 01/30/2019 CLINICAL DATA:  Headache. Rule out cerebral aneurysm subarachnoid hemorrhage or cerebral vasospasm EXAM: CT ANGIOGRAPHY HEAD AND NECK TECHNIQUE: Multidetector CT  imaging of the head and neck was performed using the standard protocol during bolus administration of intravenous contrast. Multiplanar CT image reconstructions and MIPs were obtained to evaluate the vascular anatomy. Carotid stenosis measurements (when applicable) are obtained utilizing NASCET criteria, using the distal internal carotid diameter as the denominator. CONTRAST:  15mL OMNIPAQUE IOHEXOL 350 MG/ML SOLN COMPARISON:  None. FINDINGS: CT HEAD FINDINGS Brain: Mild atrophy. Patchy white matter disease bilaterally is symmetric and most likely chronic. No acute cortical infarct. Negative for hemorrhage or mass. Vascular: Negative for hyperdense vessel Skull: Negative Sinuses: Mild mucosal edema paranasal sinuses. Negative orbit Orbits: Review of the MIP images confirms the above findings CTA NECK FINDINGS Aortic arch: Standard branching. Imaged portion shows no evidence of aneurysm or dissection. No significant stenosis of the major arch vessel origins. Left-sided transvenous pacemaker noted. Right carotid system: Right carotid system widely patent without significant  stenosis. Mild atherosclerotic calcification right carotid bifurcation Left carotid system: Left carotid system widely patent without stenosis. Mild atherosclerotic calcification left carotid bifurcation Vertebral arteries: Both vertebral arteries patent to the basilar without significant stenosis. Skeleton: Cervical spondylosis. Negative for acute skeletal abnormality. Other neck: Negative for mass or adenopathy Upper chest: Mild apically scarring bilaterally. No acute abnormality. Review of the MIP images confirms the above findings CTA HEAD FINDINGS Anterior circulation: Atherosclerotic calcification in the cavernous carotid bilaterally with mild stenosis. Anterior and middle cerebral arteries patent bilaterally without stenosis occlusion or aneurysm. Posterior circulation: Both vertebral arteries patent to the basilar. Basilar widely patent. PICA patent bilaterally. AICA, and superior cerebellar and posterior cerebral arteries patent bilaterally. Fetal origin left posterior cerebral artery. Venous sinuses: Normal venous enhancement. Left transverse sinus dominant. Review of the MIP images confirms the above findings IMPRESSION: 1. No significant intracranial or extracranial stenosis. Mild atherosclerotic disease carotid bifurcation and cavernous carotid bilaterally. 2. Negative for intracranial large vessel occlusion 3. No acute intracranial abnormality. Negative for acute infarct or hemorrhage. Electronically Signed   By: Franchot Gallo M.D.   On: 01/30/2019 18:35   Ct Angio Neck W And/or Wo Contrast  Result Date: 01/30/2019 CLINICAL DATA:  Headache. Rule out cerebral aneurysm subarachnoid hemorrhage or cerebral vasospasm EXAM: CT ANGIOGRAPHY HEAD AND NECK TECHNIQUE: Multidetector CT imaging of the head and neck was performed using the standard protocol during bolus administration of intravenous contrast. Multiplanar CT image reconstructions and MIPs were obtained to evaluate the vascular anatomy. Carotid  stenosis measurements (when applicable) are obtained utilizing NASCET criteria, using the distal internal carotid diameter as the denominator. CONTRAST:  93mL OMNIPAQUE IOHEXOL 350 MG/ML SOLN COMPARISON:  None. FINDINGS: CT HEAD FINDINGS Brain: Mild atrophy. Patchy white matter disease bilaterally is symmetric and most likely chronic. No acute cortical infarct. Negative for hemorrhage or mass. Vascular: Negative for hyperdense vessel Skull: Negative Sinuses: Mild mucosal edema paranasal sinuses. Negative orbit Orbits: Review of the MIP images confirms the above findings CTA NECK FINDINGS Aortic arch: Standard branching. Imaged portion shows no evidence of aneurysm or dissection. No significant stenosis of the major arch vessel origins. Left-sided transvenous pacemaker noted. Right carotid system: Right carotid system widely patent without significant stenosis. Mild atherosclerotic calcification right carotid bifurcation Left carotid system: Left carotid system widely patent without stenosis. Mild atherosclerotic calcification left carotid bifurcation Vertebral arteries: Both vertebral arteries patent to the basilar without significant stenosis. Skeleton: Cervical spondylosis. Negative for acute skeletal abnormality. Other neck: Negative for mass or adenopathy Upper chest: Mild apically scarring bilaterally. No acute abnormality. Review of the MIP images confirms the  above findings CTA HEAD FINDINGS Anterior circulation: Atherosclerotic calcification in the cavernous carotid bilaterally with mild stenosis. Anterior and middle cerebral arteries patent bilaterally without stenosis occlusion or aneurysm. Posterior circulation: Both vertebral arteries patent to the basilar. Basilar widely patent. PICA patent bilaterally. AICA, and superior cerebellar and posterior cerebral arteries patent bilaterally. Fetal origin left posterior cerebral artery. Venous sinuses: Normal venous enhancement. Left transverse sinus dominant.  Review of the MIP images confirms the above findings IMPRESSION: 1. No significant intracranial or extracranial stenosis. Mild atherosclerotic disease carotid bifurcation and cavernous carotid bilaterally. 2. Negative for intracranial large vessel occlusion 3. No acute intracranial abnormality. Negative for acute infarct or hemorrhage. Electronically Signed   By: Franchot Gallo M.D.   On: 01/30/2019 18:35   Ct Chest W Contrast  Result Date: 01/30/2019 CLINICAL DATA:  Acute onset of chest pain, shortness of breath and generalized weakness that began yesterday. Abnormal chest x-ray questioning a pericardial effusion and possible pneumonia. EXAM: CT CHEST WITH CONTRAST TECHNIQUE: Multidetector CT imaging of the chest was performed during intravenous contrast administration. CONTRAST:  34mL OMNIPAQUE IOHEXOL 350 MG/ML IV. COMPARISON:  No prior chest CT. Multiple prior chest x-rays. FINDINGS: Cardiovascular: Marked cardiomegaly, with massive enlargement of the RIGHT atrium and marked enlargement of the LEFT atrium. No pericardial effusion. Mitral annular calcification. Mild aortic valvular calcification. Moderate LAD and LEFT circumflex coronary atherosclerosis. Moderate atherosclerosis involving the thoracic and proximal abdominal aorta without evidence of aneurysm. Mediastinum/Nodes: No pathologically enlarged mediastinal, hilar or axillary lymph nodes. No mediastinal masses. Normal-appearing esophagus. Lungs/Pleura: Conglomerate scar and bronchiectasis involving the lingula, stable dating back to an October, 2019 chest x-ray. Confluent opacities associated with nodularity in the RIGHT MIDDLE LOBE. Minimal linear scarring in the LATERAL RIGHT UPPER LOBE, unchanged dating back to 2019. Biapical pleuroparenchymal scarring associated with calcifications. No confluent or ground-glass airspace consolidation elsewhere. No pleural effusions. Central airways patent without significant bronchial wall thickening. Upper  Abdomen: Flash enhancement of an approximate 1.2 cm lesion in the ANTERIOR segment RIGHT lobe and a 1.5 cm lesion in the LATERAL segment LEFT lobe. Mild intrahepatic biliary ductal dilation, particularly in the LEFT lobe. Visualized upper abdomen otherwise unremarkable for the early arterial phase of enhancement. Musculoskeletal: Degenerative disc disease and spondylosis involving the visualized LOWER cervical spine and throughout the thoracic spine. No acute findings. IMPRESSION: 1. Conglomerate scar and bronchiectasis involving the lingula, stable dating back to an October, 2019, chest x-ray. 2. Confluent opacities associated with nodularity in the RIGHT MIDDLE LOBE. This may also represent scarring, though an infectious or inflammatory process is also a consideration, potentially MAI. 3. No acute cardiopulmonary disease otherwise. 4. Marked cardiomegaly with massive enlargement of the RIGHT atrium and marked enlargement of the LEFT atrium. This accounts for the chest x-ray finding. 5. Flash enhancement of an approximate 1.2 cm lesion in the ANTERIOR segment RIGHT lobe and a 1.5 cm lesion in the LATERAL segment LEFT lobe of the liver, statistically likely to represent hemangiomas/vascular malformations. 6. Mild intrahepatic biliary ductal dilation, particularly in the LEFT lobe. The extrahepatic bile duct was not imaged. Please correlate with liver function tests. Aortic Atherosclerosis (ICD10-170.0) Electronically Signed   By: Evangeline Dakin M.D.   On: 01/30/2019 18:11   Dg Chest Port 1 View  Result Date: 01/30/2019 CLINICAL DATA:  Congestion, left axillary chest pain. EXAM: PORTABLE CHEST 1 VIEW COMPARISON:  04/28/2018 FINDINGS: Cardiac pacemaker in stable position. Marked enlargement of the cardiac silhouette, even for portable technique. Mediastinal contours appear intact. Ill-defined airspace  opacity in the left mid lung field, likely within the lingula. Nodular opacities in the right lower lung field.  Osseous structures are without acute abnormality. Soft tissues are grossly normal. IMPRESSION: 1. Marked enlargement of the cardiac silhouette, even considering portable AP technique. This may represent cardiomyopathy or pericardial effusion. 2. Ill-defined airspace opacity in the left mid lung field, likely within the lingula, progressed from the prior study dated 04/28/2018. 3. Nodular opacities in the right lower lung field. Electronically Signed   By: Fidela Salisbury M.D.   On: 01/30/2019 15:21    Patient Profile     85 year old female with past medical history of permanent atrial fibrillation, prior pacemaker, hypertension, prior CVA, hypothyroidism for evaluation of non-ST elevation myocardial infarction. Cardiac catheterization January 31, 2019 showed mild LV dysfunction with apical ballooning suggestive of stress cardiomyopathy.  Ejection fraction 45 to 50%.  Coronary arteries normal.  Assessment & Plan    1 non-ST elevation myocardial infarction-cardiac catheterization results noted.  No coronary artery disease.  Pattern suggestive of Takotsubo CM.  Plan medical therapy.  Continue present dose of bisoprolol.  Add losartan 25 mg daily.  Repeat echocardiogram in approximately 8 weeks to see if LV function has recovered.  2 permanent atrial fibrillation-CHADSvasc 6; resume apixaban; DC ASA; continue bisoprolol.  3prior pacemaker-follow-up Dr. Caryl Comes.  4 hypertension-add losartan 25 mg daily and follow.  Check potassium and renal function in 1 week.  5opacities on chest x-ray-further evaluation per primary care.  6 hypokalemia-supplement  [facility] will sign off.   Medication Recommendations:  Continue present meds as listed in [facility] Other recommendations (labs, testing, etc):  BMET one week following DC. Follow up as an outpatient:  Dr Caryl Comes 2-4 weeks following DC.  For questions or updates, please contact [place] Please consult [email or web address] for contact info under          Signed, Kirk Ruths, MD  02/01/2019, 7:57 AM

## 2019-02-02 ENCOUNTER — Telehealth: Payer: Self-pay

## 2019-02-02 NOTE — Telephone Encounter (Signed)
LM for patient to return call for TCM. 

## 2019-02-04 DIAGNOSIS — I119 Hypertensive heart disease without heart failure: Secondary | ICD-10-CM | POA: Diagnosis not present

## 2019-02-04 DIAGNOSIS — Z48812 Encounter for surgical aftercare following surgery on the circulatory system: Secondary | ICD-10-CM | POA: Diagnosis not present

## 2019-02-04 DIAGNOSIS — I444 Left anterior fascicular block: Secondary | ICD-10-CM | POA: Diagnosis not present

## 2019-02-04 DIAGNOSIS — I4891 Unspecified atrial fibrillation: Secondary | ICD-10-CM | POA: Diagnosis not present

## 2019-02-04 DIAGNOSIS — I4510 Unspecified right bundle-branch block: Secondary | ICD-10-CM | POA: Diagnosis not present

## 2019-02-04 DIAGNOSIS — I69352 Hemiplegia and hemiparesis following cerebral infarction affecting left dominant side: Secondary | ICD-10-CM | POA: Diagnosis not present

## 2019-02-04 DIAGNOSIS — I495 Sick sinus syndrome: Secondary | ICD-10-CM | POA: Diagnosis not present

## 2019-02-04 DIAGNOSIS — I214 Non-ST elevation (NSTEMI) myocardial infarction: Secondary | ICD-10-CM | POA: Diagnosis not present

## 2019-02-04 DIAGNOSIS — J479 Bronchiectasis, uncomplicated: Secondary | ICD-10-CM | POA: Diagnosis not present

## 2019-02-06 DIAGNOSIS — J479 Bronchiectasis, uncomplicated: Secondary | ICD-10-CM | POA: Diagnosis not present

## 2019-02-06 DIAGNOSIS — I495 Sick sinus syndrome: Secondary | ICD-10-CM | POA: Diagnosis not present

## 2019-02-06 DIAGNOSIS — I69352 Hemiplegia and hemiparesis following cerebral infarction affecting left dominant side: Secondary | ICD-10-CM | POA: Diagnosis not present

## 2019-02-06 DIAGNOSIS — I119 Hypertensive heart disease without heart failure: Secondary | ICD-10-CM | POA: Diagnosis not present

## 2019-02-06 DIAGNOSIS — I4510 Unspecified right bundle-branch block: Secondary | ICD-10-CM | POA: Diagnosis not present

## 2019-02-06 DIAGNOSIS — I444 Left anterior fascicular block: Secondary | ICD-10-CM | POA: Diagnosis not present

## 2019-02-06 DIAGNOSIS — I214 Non-ST elevation (NSTEMI) myocardial infarction: Secondary | ICD-10-CM | POA: Diagnosis not present

## 2019-02-06 DIAGNOSIS — Z48812 Encounter for surgical aftercare following surgery on the circulatory system: Secondary | ICD-10-CM | POA: Diagnosis not present

## 2019-02-06 DIAGNOSIS — I4891 Unspecified atrial fibrillation: Secondary | ICD-10-CM | POA: Diagnosis not present

## 2019-02-07 NOTE — Progress Notes (Signed)
Megan Olsen is a 85 y.o. female is here for follow up.  History of Present Illness:   HPI:   85 year old lady with persistent atrial fibrillation was admitted with HTN urgency, elevated troponin. Cardiac catheterization:  Angiographically normal coronary arteries Mildly reduced LVEF of roughly 45% and Takotsubo pattern, suspect stress-induced cardiomyopathy.  Medication changes: Bisoprolol 2.5 mg po daily, Lipitor added.   Discharge Diagnoses:  Principal Problem:   NSTEMI (non-ST elevated myocardial infarction) [facility]) Active Problems:   A-fib [facility]), s/p catheter ablation of SVT in 2002   Anxiety disorder   History of CVA (cerebrovascular accident), without residual effects   Tachy-brady syndrome ([place])   Hypertensive urgency  There are no preventive care reminders to display for this patient. Depression screen [facility] 2/9 08/22/2018 05/25/2018 01/26/2018  Decreased Interest 1 0 0  Down, Depressed, Hopeless 0 0 0  PHQ - 2 Score 1 0 0  Altered sleeping 1 1 0  Tired, decreased energy 1 1 0  Change in appetite 0 0 0  Feeling bad or failure about yourself  0 0 0  Trouble concentrating 0 1 0  Moving slowly or fidgety/restless 0 0 0  Suicidal thoughts 0 0 0  PHQ-9 Score 3 3 0  Difficult doing work/chores Not difficult at all - Not difficult at all   PMHx, SurgHx, SocialHx, FamHx, Medications, and Allergies were reviewed in the Visit Navigator and updated as appropriate.   Patient Active Problem List   Diagnosis Date Noted  . NSTEMI (non-ST elevated myocardial infarction) ([place]) 01/30/2019  . Hypertensive urgency 01/30/2019  . B12 deficiency 08/22/2018  . Facet arthritis, degenerative, cervical spine 08/22/2018  . Tachy-brady syndrome ([place]) 04/27/2018  . Cervical spondylosis without myelopathy 03/11/2018  . Myofascial pain syndrome 03/11/2018  . Cervicalgia 03/11/2018  . Insomnia 01/26/2018  . High risk medication use 01/26/2018  . Macular degeneration of both eyes 01/26/2018   . S/P right THA, AA 02/23/2017  . Hypothyroidism 09/07/2014  . A-fib [place]), s/p catheter ablation of SVT in 2002 07/06/2014  . History of fracture of left hip 07/06/2014  . Benign essential HTN 07/06/2014  . GERD (gastroesophageal reflux disease) 07/06/2014  . Anxiety disorder 07/06/2014  . History of CVA (cerebrovascular accident), without residual effects 07/06/2014  . Iron deficiency anemia 01/02/2013  . Constipation, intermittent 08/04/2007  . Depression 01/11/2007  . Allergic rhinitis 01/11/2007  . Osteoarthritis 01/11/2007  . Osteoporosis 01/11/2007   Social History   Tobacco Use  . Smoking status: Never Smoker  . Smokeless tobacco: Never Used  Substance Use Topics  . Alcohol use: Yes    Comment: 04/27/2018 "a beer q 3 months or so; if that"  . Drug use: Never   Current Medications and Allergies   .  acetaminophen (TYLENOL) 650 MG CR tablet, Take 650 mg by mouth every 8 (eight) hours as needed for pain. , Disp: , Rfl:  .  apixaban (ELIQUIS) 2.5 MG TABS tablet, Take 1 tablet (2.5 mg total) by mouth 2 (two) times daily., Disp: 60 tablet, Rfl: 9 .  bisoprolol (ZEBETA) 5 MG tablet, Take 0.5 tablets (2.5 mg total) by mouth daily., Disp: 30 tablet, Rfl:  .  Carboxymethylcellulose Sodium (THERATEARS) 0.25 % SOLN, Place 1 drop into both eyes 3 (three) times daily as needed (for dry/irritated eyes.)., Disp: , Rfl:  .  Cholecalciferol (VITAMIN D3) 2000 units TABS, Take 2,000 Units by mouth daily., Disp: , Rfl:  .  dexlansoprazole (DEXILANT) 60 MG capsule, Take 1  capsule (60 mg total) by mouth daily. (Patient not taking: Reported on 01/30/2019), Disp: 90 capsule, Rfl: 3 .  hydrochlorothiazide (MICROZIDE) 12.5 MG capsule, Take 12.5 mg by mouth daily., Disp: , Rfl:  .  LORazepam (ATIVAN) 1 MG tablet, Take 1 tablet (1 mg total) by mouth at bedtime., Disp: 90 tablet, Rfl: 1 .  losartan (COZAAR) 25 MG tablet, Take 1 tablet (25 mg total) by mouth daily., Disp: 30 tablet, Rfl: 3 .  Multiple  Vitamins-Minerals (PRESERVISION AREDS 2) CAPS, Take 1 capsule by mouth 2 (two) times daily., Disp: 90 capsule, Rfl: 3 .  nitroGLYCERIN (NITROSTAT) 0.4 MG SL tablet, Place 1 tablet (0.4 mg total) under the tongue every 5 (five) minutes x 3 doses as needed for chest pain., Disp: 30 tablet, Rfl: 0 .  NON FORMULARY, Place 1 Dose into both eyes See admin instructions. Receives injections into both eyes every 5-8 weeks at Dr. Serita Grit office [facility]), Disp: , Rfl:    Allergies  Allergen Reactions  . Propoxyphene N-Acetaminophen Nausea Only  . Celecoxib Itching and Rash  . Fexofenadine Rash  . Penicillins Hives and Rash    Has patient had a PCN reaction causing immediate rash, facial/tongue/throat swelling, SOB or lightheadedness with hypotension: Yes Has patient had a PCN reaction causing severe rash involving mucus membranes or skin necrosis: No Has patient had a PCN reaction that required hospitalization: No Has patient had a PCN reaction occurring within the last 10 years: No If all of the above answers are "NO", then may proceed with Cephalosporin use.    Review of Systems   Pertinent items are noted in the HPI. Otherwise, a complete ROS is negative.  Vitals   Vitals:   02/08/19 1505  BP: 130/68  Pulse: (!) 59  Temp: 98 F (36.7 C)  TempSrc: Oral  SpO2: 95%  Weight: 123 lb 8 oz (56 kg)  Height: 5\' 4"  (1.626 m)     Body mass index is 21.2 kg/m.  Physical Exam   Physical Exam Vitals signs and nursing note reviewed.  HENT:     Head: Normocephalic and atraumatic.  Eyes:     Pupils: Pupils are equal, round, and reactive to light.  Neck:     Musculoskeletal: Normal range of motion and neck supple.  Cardiovascular:     Rate and Rhythm: Normal rate.  Pulmonary:     Effort: Pulmonary effort is normal.  Abdominal:     Palpations: Abdomen is soft.  Skin:    General: Skin is warm.  Psychiatric:        Behavior: Behavior normal.    Results for orders  placed or performed in visit on 02/08/19  CBC with Differential/Platelet  Result Value Ref Range   WBC 6.2 4.0 - 10.5 K/uL   RBC 3.64 (L) 3.87 - 5.11 Mil/uL   Hemoglobin 11.7 (L) 12.0 - 15.0 g/dL   HCT 34.7 (L) 36.0 - 46.0 %   MCV 95.3 78.0 - 100.0 fl   MCHC 33.8 30.0 - 36.0 g/dL   RDW 13.1 11.5 - 15.5 %   Platelets 177.0 150.0 - 400.0 K/uL   Neutrophils Relative % 70.5 43.0 - 77.0 %   Lymphocytes Relative 15.4 12.0 - 46.0 %   Monocytes Relative 10.2 3.0 - 12.0 %   Eosinophils Relative 3.5 0.0 - 5.0 %   Basophils Relative 0.4 0.0 - 3.0 %   Neutro Abs 4.4 1.4 - 7.7 K/uL   Lymphs Abs 1.0 0.7 - 4.0 K/uL  Monocytes Absolute 0.6 0.1 - 1.0 K/uL   Eosinophils Absolute 0.2 0.0 - 0.7 K/uL   Basophils Absolute 0.0 0.0 - 0.1 K/uL  Comprehensive metabolic panel  Result Value Ref Range   Sodium 137 135 - 145 mEq/L   Potassium 4.5 3.5 - 5.1 mEq/L   Chloride 102 96 - 112 mEq/L   CO2 26 19 - 32 mEq/L   Glucose, Bld 78 70 - 99 mg/dL   BUN 23 6 - 23 mg/dL   Creatinine, Ser 0.88 0.40 - 1.20 mg/dL   Total Bilirubin 0.5 0.2 - 1.2 mg/dL   Alkaline Phosphatase 94 39 - 117 U/L   AST 21 0 - 37 U/L   ALT 16 0 - 35 U/L   Total Protein 6.3 6.0 - 8.3 g/dL   Albumin 4.2 3.5 - 5.2 g/dL   Calcium 9.1 8.4 - 10.5 mg/dL   GFR 60.47 >60.00 mL/min  Iron, TIBC and Ferritin Panel  Result Value Ref Range   Iron 81 45 - 160 mcg/dL   TIBC 334 250 - 450 mcg/dL (calc)   %SAT 24 16 - 45 % (calc)   Ferritin 87 16 - 288 ng/mL  Magnesium  Result Value Ref Range   Magnesium 2.0 1.5 - 2.5 mg/dL    Assessment and Plan   Makesha was seen today for hospitalization follow-up.  Diagnoses and all orders for this visit:  Hospital discharge follow-up See the TCM Nursing Call Back and/or Case Manager note in the electronic chart for phone assessment done between time of discharge and today's visit. Hospital medical records were reviewed in detail today. The following care points were reviewed with provider and patient.  Questions were addressed. Topics included, but were not limited to: 1) Self-management; 2) ADLs; 3) Facilitation of necessary care and services, including any DME or home services; 4) Patient education on diagnoses and treatment regimen reviewed; 5) Independent living; and 6) Medication management. Specifically, the medication management involved a thorough review / reconciliation of patient's discharge and ambulatory medications by the provider and verification of patient compliance. Patient's medications at the time of discharge are as listed above. See additional orders where appropriate and return to clinic section for more instructions.   Generalized anxiety disorder -     LORazepam (ATIVAN) 1 MG tablet; Take 1 tablet (1 mg total) by mouth at bedtime.  Permanent atrial fibrillation  Benign essential HTN -     Comprehensive metabolic panel -     Magnesium  B12 deficiency  Gastroesophageal reflux disease, esophagitis presence not specified -     dexlansoprazole (DEXILANT) 60 MG capsule; Take 1 capsule (60 mg total) by mouth daily.  Abnormal chest x-ray -     DG Chest 2 View; Future  Other iron deficiency anemia -     CBC with Differential/Platelet -     Iron, TIBC and Ferritin Panel    . Orders and follow up as documented in [place], reviewed diet, exercise and weight control, cardiovascular risk and specific lipid/LDL goals reviewed, reviewed medications and side effects in detail.  . Reviewed expectations re: course of current medical issues. . Outlined signs and symptoms indicating need for more acute intervention. . Patient verbalized understanding and all questions were answered. . Patient received an After Visit Summary.  Briscoe Deutscher, DO , [facility] 02/15/2019

## 2019-02-08 ENCOUNTER — Encounter: Payer: Self-pay | Admitting: Family Medicine

## 2019-02-08 ENCOUNTER — Other Ambulatory Visit: Payer: Self-pay

## 2019-02-08 ENCOUNTER — Ambulatory Visit (INDEPENDENT_AMBULATORY_CARE_PROVIDER_SITE_OTHER): Payer: Medicare HMO

## 2019-02-08 ENCOUNTER — Ambulatory Visit (INDEPENDENT_AMBULATORY_CARE_PROVIDER_SITE_OTHER): Payer: Medicare HMO | Admitting: Family Medicine

## 2019-02-08 VITALS — BP 130/68 | HR 59 | Temp 98.0°F | Ht 64.0 in | Wt 123.5 lb

## 2019-02-08 DIAGNOSIS — F411 Generalized anxiety disorder: Secondary | ICD-10-CM | POA: Diagnosis not present

## 2019-02-08 DIAGNOSIS — E538 Deficiency of other specified B group vitamins: Secondary | ICD-10-CM

## 2019-02-08 DIAGNOSIS — J9811 Atelectasis: Secondary | ICD-10-CM | POA: Diagnosis not present

## 2019-02-08 DIAGNOSIS — D508 Other iron deficiency anemias: Secondary | ICD-10-CM

## 2019-02-08 DIAGNOSIS — K219 Gastro-esophageal reflux disease without esophagitis: Secondary | ICD-10-CM

## 2019-02-08 DIAGNOSIS — R9389 Abnormal findings on diagnostic imaging of other specified body structures: Secondary | ICD-10-CM | POA: Diagnosis not present

## 2019-02-08 DIAGNOSIS — I4821 Permanent atrial fibrillation: Secondary | ICD-10-CM

## 2019-02-08 DIAGNOSIS — I10 Essential (primary) hypertension: Secondary | ICD-10-CM | POA: Diagnosis not present

## 2019-02-08 DIAGNOSIS — D509 Iron deficiency anemia, unspecified: Secondary | ICD-10-CM | POA: Diagnosis not present

## 2019-02-08 DIAGNOSIS — Z09 Encounter for follow-up examination after completed treatment for conditions other than malignant neoplasm: Secondary | ICD-10-CM

## 2019-02-08 MED ORDER — DEXILANT 60 MG PO CPDR: 1.0000 | DELAYED_RELEASE_CAPSULE | Freq: Every day | ORAL | 3 refills | Status: DC

## 2019-02-08 MED ORDER — LORAZEPAM 1 MG PO TABS: 1.0000 mg | ORAL_TABLET | Freq: Every day | ORAL | 1 refills | Status: DC

## 2019-02-09 DIAGNOSIS — I69352 Hemiplegia and hemiparesis following cerebral infarction affecting left dominant side: Secondary | ICD-10-CM | POA: Diagnosis not present

## 2019-02-09 DIAGNOSIS — J479 Bronchiectasis, uncomplicated: Secondary | ICD-10-CM | POA: Diagnosis not present

## 2019-02-09 DIAGNOSIS — I4891 Unspecified atrial fibrillation: Secondary | ICD-10-CM | POA: Diagnosis not present

## 2019-02-09 DIAGNOSIS — I444 Left anterior fascicular block: Secondary | ICD-10-CM | POA: Diagnosis not present

## 2019-02-09 DIAGNOSIS — I495 Sick sinus syndrome: Secondary | ICD-10-CM | POA: Diagnosis not present

## 2019-02-09 DIAGNOSIS — I4510 Unspecified right bundle-branch block: Secondary | ICD-10-CM | POA: Diagnosis not present

## 2019-02-09 DIAGNOSIS — I119 Hypertensive heart disease without heart failure: Secondary | ICD-10-CM | POA: Diagnosis not present

## 2019-02-09 DIAGNOSIS — I214 Non-ST elevation (NSTEMI) myocardial infarction: Secondary | ICD-10-CM | POA: Diagnosis not present

## 2019-02-09 DIAGNOSIS — Z48812 Encounter for surgical aftercare following surgery on the circulatory system: Secondary | ICD-10-CM | POA: Diagnosis not present

## 2019-02-09 LAB — COMPREHENSIVE METABOLIC PANEL
ALT: 16 U/L (ref 0–35)
AST: 21 U/L (ref 0–37)
Albumin: 4.2 g/dL (ref 3.5–5.2)
Alkaline Phosphatase: 94 U/L (ref 39–117)
BUN: 23 mg/dL (ref 6–23)
CO2: 26 mEq/L (ref 19–32)
Calcium: 9.1 mg/dL (ref 8.4–10.5)
Chloride: 102 mEq/L (ref 96–112)
Creatinine, Ser: 0.88 mg/dL (ref 0.40–1.20)
GFR: 60.47 mL/min (ref >60.00)
Glucose, Bld: 78 mg/dL (ref 70–99)
Potassium: 4.5 mEq/L (ref 3.5–5.1)
Sodium: 137 mEq/L (ref 135–145)
Total Bilirubin: 0.5 mg/dL (ref 0.2–1.2)
Total Protein: 6.3 g/dL (ref 6.0–8.3)

## 2019-02-09 LAB — CBC WITH DIFFERENTIAL/PLATELET
Basophils Absolute: 0 10*3/uL (ref 0.0–0.1)
Basophils Relative: 0.4 % (ref 0.0–3.0)
Eosinophils Absolute: 0.2 10*3/uL (ref 0.0–0.7)
Eosinophils Relative: 3.5 % (ref 0.0–5.0)
HCT: 34.7 % — ABNORMAL LOW (ref 36.0–46.0)
Hemoglobin: 11.7 g/dL — ABNORMAL LOW (ref 12.0–15.0)
Lymphocytes Relative: 15.4 % (ref 12.0–46.0)
Lymphs Abs: 1 10*3/uL (ref 0.7–4.0)
MCHC: 33.8 g/dL (ref 30.0–36.0)
MCV: 95.3 fl (ref 78.0–100.0)
Monocytes Absolute: 0.6 10*3/uL (ref 0.1–1.0)
Monocytes Relative: 10.2 % (ref 3.0–12.0)
Neutro Abs: 4.4 10*3/uL (ref 1.4–7.7)
Neutrophils Relative %: 70.5 % (ref 43.0–77.0)
Platelets: 177 10*3/uL (ref 150.0–400.0)
RBC: 3.64 Mil/uL — ABNORMAL LOW (ref 3.87–5.11)
RDW: 13.1 % (ref 11.5–15.5)
WBC: 6.2 10*3/uL (ref 4.0–10.5)

## 2019-02-09 LAB — IRON,TIBC AND FERRITIN PANEL
%SAT: 24 % (calc) (ref 16–45)
Ferritin: 87 ng/mL (ref 16–288)
Iron: 81 ug/dL (ref 45–160)
TIBC: 334 mcg/dL (calc) (ref 250–450)

## 2019-02-09 LAB — MAGNESIUM: Magnesium: 2 mg/dL (ref 1.5–2.5)

## 2019-02-10 ENCOUNTER — Telehealth: Payer: Self-pay | Admitting: *Deleted

## 2019-02-10 NOTE — Telephone Encounter (Signed)
   Patient  [id number] Pre-Screening Questions:  . In the past 7 to 10 days have you had a cough,  shortness of breath, headache, congestion, fever (100 or greater) body aches, chills, sore throat, or sudden loss of taste or sense of smell? NO  . Have you been around anyone with known Covid 19. NO  . Have you been around anyone who is awaiting Covid 19 test results in the past 7 to 10 days? NO . Have you been around anyone who has been exposed to Covid 19, or has mentioned symptoms of Covid 19 within the past 7 to 10 days? NO   If you have any concerns/questions about symptoms patients report during screening (either on the phone or at threshold). Contact the provider seeing the patient or DOD for further guidance.  If neither are available contact a member of the leadership team.   Arletta Bale      COVID-19 Pre-Screening Questions:  . In the past 7 to 10 days have you had a cough,  shortness of breath, headache, congestion, fever (100 or greater) body aches, chills, sore throat, or sudden loss of taste or sense of smell? NO  . Have you been around anyone with known Covid 19.NO  . Have you been around anyone who is awaiting Covid 19 test results in the past 7 to 10 days? NO . Have you been around anyone who has been exposed to Covid 19, or has mentioned symptoms of Covid 19 within the past 7 to 10 days? NO  If you have any concerns/questions about symptoms patients report during screening (either on the phone or at threshold). Contact the provider seeing the patient or DOD for further guidance.  If neither are available contact a member of the leadership team.

## 2019-02-10 NOTE — Progress Notes (Signed)
CARDIOLOGY OFFICE NOTE  Date:  02/13/2019    Megan Olsen Date of Birth: November 06, 1927 Medical Record #[id number]  PCP:  Briscoe Deutscher, DO  Cardiologist:  Megan Olsen & Megan Olsen    Chief Complaint  Patient presents with  . Follow-up    Seen for Dr. Marlou Olsen    History of Present Illness: Megan Olsen is a 85 y.o. female who presents today for a follow up visit. Seen for Dr. Hampton Olsen.   She has had a history of chronic AF, bradycardia, moderate MR from echo dating back to 2012 with normal EF, remote SVT ablation in 2002. Previously on coumadin. Former CVA.    Last seen by Dr. Marlou Olsen in August of 2019 - she was felt to be doing ok - declined anticoagulation despite elevated CHADSVASC but has been started on Megan Olsen. She was noted to have marked bradycardia - beta blocker was stopped - monitor obtained and she was subsequently referred for PPM implant by Dr. Caryl Olsen in October of 2019.   I then saw her this past February - she was doing well - getting ready to start B12 and iron infusions.   Presented with NSTEMI earlier this month - cardiac cath showed mild LV dysfunction with apical ballooning suggestive of stress cardiomyopathy - EF of 45 to 50%. Coronaries are normal. Pattern suggestive of Takotsubo - beta blocker continued, ARB started. Needs an echo in about 8 weeks per Dr. Stanford Olsen - noted no echo during this admission. Opacities noted on CXR. Concern her device needing reprogramming.   The patient does not have symptoms concerning for COVID-19 infection (fever, chills, cough, or new shortness of breath).   Olsen in today. Here with her neighbor today. She is doing ok. Has been home about 2 weeks. Still with spells of where she feels "stuffy" - sounds pre syncopal still. Happened earlier today - she was trying to iron - had to stop and lie down. No chest pain. She has seen her PCP - has had follow up CXR as well along with labs. No recent TSH noted. Seeing Dr. Caryl Olsen  later this week. She does try to be very independent - says she "has to" - no children, siblings have all passed, etc. She does not have a way to check her BP at home.   Past Medical History:  Diagnosis Date  . Age-related macular degeneration, wet, both eyes (Megan Olsen)   . Anxiety disorder 07/06/2014  . Arthritis    "all over" (04/27/2018)  . Atrial fibrillation ([place])    catheter ablation of SVT in 2002  . Benign essential HTN 07/06/2014  . Constipation, intermittent 08/04/2007  . CVA (cerebrovascular accident) ([place]) 2003   left brain; denies residual on 04/27/2018  . Depression   . GERD (gastroesophageal reflux disease)   . Heart murmur    hx (04/27/2018)  . Hypothyroidism 09/07/2014   "off RX now" (04/27/2018)  . Iron deficiency anemia   . Iron deficiency anemia, unspecified 01/02/2013  . Osteoarthritis   . Osteoporosis 01/11/2007  . Pneumonia    "one time; years and years ago" (04/27/2018)  . Presence of permanent cardiac pacemaker 04/27/2018    Dual Chamber  . Right BBB/left ant fasc block     Past Surgical History:  Procedure Laterality Date  . APPENDECTOMY    . CATARACT EXTRACTION W/ INTRAOCULAR LENS  IMPLANT, BILATERAL Bilateral   . CHOLECYSTECTOMY OPEN    . COLONOSCOPY    . EYE SURGERY    . FRACTURE  SURGERY    . HIP PINNING,CANNULATED Left 07/07/2014   Procedure: CANNULATED HIP PINNING;  Surgeon: Megan Pole, MD;  Location: [facility];  Service: Orthopedics;  Laterality: Left;  . INSERT / REPLACE / REMOVE PACEMAKER  04/27/2018    Dual Chamber  . JOINT REPLACEMENT    . LAPAROSCOPIC OVARIAN CYSTECTOMY    . LEFT HEART CATH AND CORONARY ANGIOGRAPHY N/A 01/31/2019   Procedure: LEFT HEART CATH AND CORONARY ANGIOGRAPHY;  Surgeon: Megan Man, MD;  Location: [place] [facility];  Service: Cardiovascular;  Laterality: N/A;  . NASAL SEPTUM SURGERY    . PACEMAKER IMPLANT N/A 04/27/2018   Procedure: PACEMAKER IMPLANT - Dual Chamber;  Surgeon: Megan Sprang, MD;  Location: [place] [facility];  Service: Cardiovascular;  Laterality: N/A;  . SVT ABLATION  2002  . TONSILLECTOMY    . TOTAL ABDOMINAL HYSTERECTOMY    . TOTAL HIP ARTHROPLASTY Right 02/23/2017   Procedure: RIGHT TOTAL HIP ARTHROPLASTY ANTERIOR APPROACH;  Surgeon: Megan Cancel, MD;  Location: [facility];  Service: Orthopedics;  Laterality: Right;  . VITRECTOMY Right 2008   dr Megan Olsen.  Vitrectomy and removal of tissue.      Medications: Current Meds  Medication Sig  . acetaminophen (TYLENOL) 650 MG CR tablet Take 650 mg by mouth every 8 (eight) hours as needed for pain.   Marland Kitchen apixaban (Megan Olsen) 2.5 MG TABS tablet Take 1 tablet (2.5 mg total) by mouth 2 (two) times daily.  . bisoprolol (ZEBETA) 5 MG tablet Take 0.5 tablets (2.5 mg total) by mouth daily.  . Carboxymethylcellulose Sodium (THERATEARS) 0.25 % SOLN Place 1 drop into both eyes 3 (three) times daily as needed (for dry/irritated eyes.).  Marland Kitchen Cholecalciferol (VITAMIN D3) 2000 units TABS Take 2,000 Units by mouth daily.  Marland Kitchen dexlansoprazole (DEXILANT) 60 MG capsule Take 1 capsule (60 mg total) by mouth daily.  . hydrochlorothiazide (MICROZIDE) 12.5 MG capsule Take 12.5 mg by mouth daily.  Marland Kitchen LORazepam (ATIVAN) 1 MG tablet Take 1 tablet (1 mg total) by mouth at bedtime.  Marland Kitchen losartan (COZAAR) 25 MG tablet Take 1 tablet (25 mg total) by mouth daily.  . Multiple Vitamins-Minerals (PRESERVISION AREDS 2) CAPS Take 1 capsule by mouth 2 (two) times daily.  . nitroGLYCERIN (NITROSTAT) 0.4 MG SL tablet Place 1 tablet (0.4 mg total) under the tongue every 5 (five) minutes x 3 doses as needed for chest pain.  . NON FORMULARY Place 1 Dose into both eyes See admin instructions. Receives injections into both eyes every 5-8 weeks at Megan Olsen office [facility])     Allergies: Allergies  Allergen Reactions  . Propoxyphene N-Acetaminophen Nausea Only  . Celecoxib Itching and Rash  . Fexofenadine Rash  . Penicillins Hives and Rash    Has patient had a PCN  reaction causing immediate rash, facial/tongue/throat swelling, SOB or lightheadedness with hypotension: Yes Has patient had a PCN reaction causing severe rash involving mucus membranes or skin necrosis: No Has patient had a PCN reaction that required hospitalization: No Has patient had a PCN reaction occurring within the last 10 years: No If all of the above answers are "NO", then may proceed with Cephalosporin use.     Social History: The patient  reports that she has never smoked. She has never used smokeless tobacco. She reports current alcohol use. She reports that she does not use drugs.   Family History: The patient's family history includes Dementia in her sister and sister; Early death in her mother;  Heart attack in her brother and father; Heart disease in her sister; Kidney disease in her mother; Nephritis in her mother.   Review of Systems: Please see the history of present illness.   All other systems are reviewed and negative.   Physical Exam: VS:  BP 128/60   Pulse (!) 57   Ht 5\' 4"  (1.626 m)   Wt 124 lb (56.2 kg)   SpO2 95%   BMI 21.28 kg/m  .  BMI Body mass index is 21.28 kg/m.  Wt Readings from Last 3 Encounters:  02/13/19 124 lb (56.2 kg)  02/08/19 123 lb 8 oz (56 kg)  02/01/19 120 lb 9.5 oz (54.7 kg)    General: Pleasant. Elderly. Alert and in no acute distress.   HEENT: Normal.  Neck: Supple, no JVD, carotid bruits, or masses noted.  Cardiac: Irregular irregular rhythm. Rate is ok.  No murmurs, rubs, or gallops. No edema.  Respiratory:  Lungs are clear to auscultation bilaterally with normal work of breathing.  GI: Soft and nontender.  MS: No deformity or atrophy. Gait and ROM intact.  Skin: Warm and dry. Color is normal.  Neuro:  Strength and sensation are intact and no gross focal deficits noted.  Psych: Alert, appropriate and with normal affect.   LABORATORY DATA:  EKG:  EKG is not ordered today.   Lab Results  Component Value Date   WBC 6.2  02/08/2019   HGB 11.7 (L) 02/08/2019   HCT 34.7 (L) 02/08/2019   PLT 177.0 02/08/2019   GLUCOSE 78 02/08/2019   CHOL 148 01/31/2019   TRIG 36 01/31/2019   HDL 66 01/31/2019   LDLCALC 75 01/31/2019   ALT 16 02/08/2019   AST 21 02/08/2019   NA 137 02/08/2019   K 4.5 02/08/2019   CL 102 02/08/2019   CREATININE 0.88 02/08/2019   BUN 23 02/08/2019   CO2 26 02/08/2019   TSH 4.83 (H) 01/26/2018   INR 0.92 01/02/2013     BNP (last 3 results) Recent Labs    01/30/19 1545  BNP 168.5*    ProBNP (last 3 results) No results for input(s): PROBNP in the last 8760 hours.   Other Studies Reviewed Today: LEFT HEART CATH AND CORONARY ANGIOGRAPHY 01/2019  Conclusion    There is mild left ventricular systolic dysfunction -with apical ballooning in Takotsubo pattern. The left ventricular ejection fraction is 45-50% by visual estimate.  LV end diastolic pressure is normal.  Angiographically normal coronary arteries with a large right dominant system   SUMMARY Angiographically normal coronary arteries Mildly reduced LVEF of roughly 45% and Takotsubo pattern --> suspect stress-induced cardiomyopathy   RECOMMENDATIONS  Return to nursing unit for ongoing care after sheath removal.       Assessment/Plan:  1. Recent NSTEMI - s/p cath - no coronary disease - noted Takotsubo - on beta blocker/low dose ARB - to get echo arranged. Based on these findings will then plan for follow up study most likely.   2. Pre syncope - see #5  3. Persistent AF - managed with rate control and is on Megan Olsen. CHADSVASC of at least 6.   4.  Abnormal chest CT - she has seen PCP and has had follow up CXR   5. Hx tachy/brady syndrome - has underlying PPM in place - seeing Dr. Caryl Olsen later this week - She has only an RV pacemaker lead and has had her rate lowered to 55. She has had an increased in V pacing with baseline HR  in the 50's. May be contributing to her symptoms. PPM interrogated in when in the  ER. Seeing EP later this week.   6. HTN - BP ok today - would hold on further titration fo rnow.   7. Anemia - has been on IV iron.   8. History of CVA   9. COVID-19 Education: The signs and symptoms of COVID-19 were discussed with the patient and how to seek care for testing (follow up with PCP or arrange E-visit).  The importance of social distancing, staying at home, hand hygiene and wearing a mask when out in public were discussed today.  Current medicines are reviewed with the patient today.  The patient does not have concerns regarding medicines other than what has been noted above.  The following changes have been made:  See above.  Labs/ tests ordered today include:    Orders Placed This Encounter  Procedures  . TSH  . ECHOCARDIOGRAM LIMITED     Disposition:   FU with me in a month.   Patient is agreeable to this plan and will call if any problems develop in the interim.   SignedTruitt Merle, NP  02/13/2019 3:59 PM  [facility] [address] [place], [place]  [address] Phone: [phone] Fax: [phone]

## 2019-02-13 ENCOUNTER — Ambulatory Visit (INDEPENDENT_AMBULATORY_CARE_PROVIDER_SITE_OTHER): Payer: Medicare HMO | Admitting: Nurse Practitioner

## 2019-02-13 ENCOUNTER — Encounter: Payer: Self-pay | Admitting: Nurse Practitioner

## 2019-02-13 ENCOUNTER — Other Ambulatory Visit: Payer: Self-pay

## 2019-02-13 VITALS — BP 128/60 | HR 57 | Ht 64.0 in | Wt 124.0 lb

## 2019-02-13 DIAGNOSIS — I5181 Takotsubo syndrome: Secondary | ICD-10-CM | POA: Diagnosis not present

## 2019-02-13 DIAGNOSIS — Z7901 Long term (current) use of anticoagulants: Secondary | ICD-10-CM

## 2019-02-13 DIAGNOSIS — I10 Essential (primary) hypertension: Secondary | ICD-10-CM

## 2019-02-13 DIAGNOSIS — Z950 Presence of cardiac pacemaker: Secondary | ICD-10-CM

## 2019-02-13 DIAGNOSIS — I4821 Permanent atrial fibrillation: Secondary | ICD-10-CM

## 2019-02-13 DIAGNOSIS — E032 Hypothyroidism due to medicaments and other exogenous substances: Secondary | ICD-10-CM | POA: Diagnosis not present

## 2019-02-13 NOTE — Patient Instructions (Addendum)
After Visit Summary:  We will be checking the following labs today - TSH   Medication Instructions:    Continue with your current medicines.    If you need a refill on your cardiac medications before your next appointment, please call your pharmacy.     Testing/Procedures To Be Arranged:  Echocardiogram - soon  Follow-Up:   See Dr. Caryl Comes later this week as planned  See me in one month    At [facility], you and your health needs are our priority.  As part of our continuing mission to provide you with exceptional heart care, we have created designated Provider Care Teams.  These Care Teams include your primary Cardiologist (physician) and Advanced Practice Providers (APPs -  Physician Assistants and Nurse Practitioners) who all work together to provide you with the care you need, when you need it.  Special Instructions:  . Stay safe, stay home, wash your hands for at least 20 seconds and wear a mask when out in public.  . It was good to talk with you today.    Call the [place] office at [phone] if you have any questions, problems or concerns.

## 2019-02-14 ENCOUNTER — Telehealth: Payer: Self-pay

## 2019-02-14 DIAGNOSIS — I444 Left anterior fascicular block: Secondary | ICD-10-CM | POA: Diagnosis not present

## 2019-02-14 DIAGNOSIS — I119 Hypertensive heart disease without heart failure: Secondary | ICD-10-CM | POA: Diagnosis not present

## 2019-02-14 DIAGNOSIS — I495 Sick sinus syndrome: Secondary | ICD-10-CM | POA: Diagnosis not present

## 2019-02-14 DIAGNOSIS — I214 Non-ST elevation (NSTEMI) myocardial infarction: Secondary | ICD-10-CM | POA: Diagnosis not present

## 2019-02-14 DIAGNOSIS — Z48812 Encounter for surgical aftercare following surgery on the circulatory system: Secondary | ICD-10-CM | POA: Diagnosis not present

## 2019-02-14 DIAGNOSIS — I69352 Hemiplegia and hemiparesis following cerebral infarction affecting left dominant side: Secondary | ICD-10-CM | POA: Diagnosis not present

## 2019-02-14 DIAGNOSIS — I4510 Unspecified right bundle-branch block: Secondary | ICD-10-CM | POA: Diagnosis not present

## 2019-02-14 DIAGNOSIS — I4891 Unspecified atrial fibrillation: Secondary | ICD-10-CM | POA: Diagnosis not present

## 2019-02-14 DIAGNOSIS — J479 Bronchiectasis, uncomplicated: Secondary | ICD-10-CM | POA: Diagnosis not present

## 2019-02-14 LAB — TSH: TSH: 3.69 u[IU]/mL (ref 0.450–4.500)

## 2019-02-14 NOTE — Telephone Encounter (Signed)
Called pt to complete COVID pre-screening. No answer.

## 2019-02-15 ENCOUNTER — Ambulatory Visit: Payer: Medicare HMO | Admitting: Internal Medicine

## 2019-02-15 NOTE — Progress Notes (Signed)
Remote pacemaker transmission.   

## 2019-02-16 ENCOUNTER — Other Ambulatory Visit (HOSPITAL_COMMUNITY): Payer: Medicare HMO

## 2019-02-16 DIAGNOSIS — I4891 Unspecified atrial fibrillation: Secondary | ICD-10-CM | POA: Diagnosis not present

## 2019-02-16 DIAGNOSIS — I4510 Unspecified right bundle-branch block: Secondary | ICD-10-CM | POA: Diagnosis not present

## 2019-02-16 DIAGNOSIS — Z48812 Encounter for surgical aftercare following surgery on the circulatory system: Secondary | ICD-10-CM | POA: Diagnosis not present

## 2019-02-16 DIAGNOSIS — J479 Bronchiectasis, uncomplicated: Secondary | ICD-10-CM | POA: Diagnosis not present

## 2019-02-16 DIAGNOSIS — I69352 Hemiplegia and hemiparesis following cerebral infarction affecting left dominant side: Secondary | ICD-10-CM | POA: Diagnosis not present

## 2019-02-16 DIAGNOSIS — I495 Sick sinus syndrome: Secondary | ICD-10-CM | POA: Diagnosis not present

## 2019-02-16 DIAGNOSIS — I119 Hypertensive heart disease without heart failure: Secondary | ICD-10-CM | POA: Diagnosis not present

## 2019-02-16 DIAGNOSIS — I444 Left anterior fascicular block: Secondary | ICD-10-CM | POA: Diagnosis not present

## 2019-02-16 DIAGNOSIS — I214 Non-ST elevation (NSTEMI) myocardial infarction: Secondary | ICD-10-CM | POA: Diagnosis not present

## 2019-02-17 ENCOUNTER — Other Ambulatory Visit: Payer: Self-pay

## 2019-02-17 ENCOUNTER — Ambulatory Visit (HOSPITAL_COMMUNITY): Payer: Medicare HMO | Attending: Internal Medicine

## 2019-02-17 DIAGNOSIS — I5181 Takotsubo syndrome: Secondary | ICD-10-CM | POA: Diagnosis not present

## 2019-02-28 ENCOUNTER — Other Ambulatory Visit: Payer: Self-pay | Admitting: Internal Medicine

## 2019-03-01 NOTE — Telephone Encounter (Signed)
Pt last saw Truitt Merle, NP on 02/13/19, last labs 02/01/19 Creat 0.94, age 85, weight 56.2kg, based on specified criteria pt is on appropriate dosage of Eliquis 2.5mg  BID.  Will refill rx.

## 2019-03-02 DIAGNOSIS — R911 Solitary pulmonary nodule: Secondary | ICD-10-CM

## 2019-03-02 DIAGNOSIS — K5900 Constipation, unspecified: Secondary | ICD-10-CM

## 2019-03-02 DIAGNOSIS — G4700 Insomnia, unspecified: Secondary | ICD-10-CM

## 2019-03-02 DIAGNOSIS — I4510 Unspecified right bundle-branch block: Secondary | ICD-10-CM | POA: Diagnosis not present

## 2019-03-02 DIAGNOSIS — M47812 Spondylosis without myelopathy or radiculopathy, cervical region: Secondary | ICD-10-CM

## 2019-03-02 DIAGNOSIS — Z8781 Personal history of (healed) traumatic fracture: Secondary | ICD-10-CM

## 2019-03-02 DIAGNOSIS — F419 Anxiety disorder, unspecified: Secondary | ICD-10-CM

## 2019-03-02 DIAGNOSIS — I495 Sick sinus syndrome: Secondary | ICD-10-CM | POA: Diagnosis not present

## 2019-03-02 DIAGNOSIS — J479 Bronchiectasis, uncomplicated: Secondary | ICD-10-CM | POA: Diagnosis not present

## 2019-03-02 DIAGNOSIS — I4891 Unspecified atrial fibrillation: Secondary | ICD-10-CM | POA: Diagnosis not present

## 2019-03-02 DIAGNOSIS — R011 Cardiac murmur, unspecified: Secondary | ICD-10-CM

## 2019-03-02 DIAGNOSIS — M47814 Spondylosis without myelopathy or radiculopathy, thoracic region: Secondary | ICD-10-CM

## 2019-03-02 DIAGNOSIS — Z48812 Encounter for surgical aftercare following surgery on the circulatory system: Secondary | ICD-10-CM | POA: Diagnosis not present

## 2019-03-02 DIAGNOSIS — Z8701 Personal history of pneumonia (recurrent): Secondary | ICD-10-CM

## 2019-03-02 DIAGNOSIS — I214 Non-ST elevation (NSTEMI) myocardial infarction: Secondary | ICD-10-CM | POA: Diagnosis not present

## 2019-03-02 DIAGNOSIS — Z95810 Presence of automatic (implantable) cardiac defibrillator: Secondary | ICD-10-CM

## 2019-03-02 DIAGNOSIS — M7918 Myalgia, other site: Secondary | ICD-10-CM

## 2019-03-02 DIAGNOSIS — M5134 Other intervertebral disc degeneration, thoracic region: Secondary | ICD-10-CM

## 2019-03-02 DIAGNOSIS — F319 Bipolar disorder, unspecified: Secondary | ICD-10-CM

## 2019-03-02 DIAGNOSIS — I69352 Hemiplegia and hemiparesis following cerebral infarction affecting left dominant side: Secondary | ICD-10-CM | POA: Diagnosis not present

## 2019-03-02 DIAGNOSIS — I444 Left anterior fascicular block: Secondary | ICD-10-CM | POA: Diagnosis not present

## 2019-03-02 DIAGNOSIS — D509 Iron deficiency anemia, unspecified: Secondary | ICD-10-CM

## 2019-03-02 DIAGNOSIS — I700 Atherosclerosis of aorta: Secondary | ICD-10-CM

## 2019-03-02 DIAGNOSIS — M159 Polyosteoarthritis, unspecified: Secondary | ICD-10-CM

## 2019-03-02 DIAGNOSIS — Z9181 History of falling: Secondary | ICD-10-CM

## 2019-03-02 DIAGNOSIS — M5030 Other cervical disc degeneration, unspecified cervical region: Secondary | ICD-10-CM

## 2019-03-02 DIAGNOSIS — I119 Hypertensive heart disease without heart failure: Secondary | ICD-10-CM | POA: Diagnosis not present

## 2019-03-02 DIAGNOSIS — Z96641 Presence of right artificial hip joint: Secondary | ICD-10-CM

## 2019-03-02 DIAGNOSIS — Z7901 Long term (current) use of anticoagulants: Secondary | ICD-10-CM

## 2019-03-02 DIAGNOSIS — E538 Deficiency of other specified B group vitamins: Secondary | ICD-10-CM

## 2019-03-02 DIAGNOSIS — K219 Gastro-esophageal reflux disease without esophagitis: Secondary | ICD-10-CM

## 2019-03-02 DIAGNOSIS — E039 Hypothyroidism, unspecified: Secondary | ICD-10-CM

## 2019-03-02 DIAGNOSIS — H353230 Exudative age-related macular degeneration, bilateral, stage unspecified: Secondary | ICD-10-CM

## 2019-03-02 DIAGNOSIS — M810 Age-related osteoporosis without current pathological fracture: Secondary | ICD-10-CM

## 2019-03-04 ENCOUNTER — Other Ambulatory Visit: Payer: Self-pay | Admitting: Cardiology

## 2019-03-07 ENCOUNTER — Other Ambulatory Visit: Payer: Self-pay | Admitting: *Deleted

## 2019-03-07 MED ORDER — HYDROCHLOROTHIAZIDE 12.5 MG PO CAPS: 12.5000 mg | ORAL_CAPSULE | Freq: Every day | ORAL | 2 refills | Status: DC

## 2019-03-07 NOTE — Telephone Encounter (Signed)
Please advise if ok to refill Historical provider.

## 2019-03-07 NOTE — Telephone Encounter (Signed)
Last filled in hospital, ok to fill?

## 2019-03-08 MED ORDER — HYDROCHLOROTHIAZIDE 12.5 MG PO CAPS: 12.5000 mg | ORAL_CAPSULE | Freq: Every day | ORAL | 2 refills | Status: DC

## 2019-03-08 NOTE — Addendum Note (Signed)
Addended by: Derl Barrow on: 03/08/2019 10:05 AM   Modules accepted: Orders

## 2019-03-08 NOTE — Telephone Encounter (Signed)
Pt calling stating that her medication hydrochlorothiazide was sent to the wrong pharmacy. Pt wanted medication to be sent to [facility] mail order pharmacy. I resent pt's medication to the correct pharmacy as requested. Confirmation received.

## 2019-03-11 NOTE — Progress Notes (Signed)
CARDIOLOGY OFFICE NOTE  Date:  03/14/2019    Megan Olsen Date of Birth: 1928/02/23 Medical Record [id number]  PCP:  Briscoe Deutscher, DO  Cardiologist:  Servando [facility]    Chief Complaint  Patient presents with  . Follow-up    History of Present Illness: Megan Olsen is a 85 y.o. female who presents today for a one month check. Seen for Dr. Hampton Abbot.   She has had a history of chronic AF, bradycardia, moderate MR from echo dating back to 2012 with normal EF, remote SVT ablation in 2002. Previously on coumadin. Former CVA.   Last seen by Dr. Marlou Porch in August of 2019 - she was felt to be doing ok - declined anticoagulation despite elevated CHADSVASC but has been started on Eliuqs. She was noted to have marked bradycardia - beta blocker was stopped - monitor obtained and she was subsequently referred for PPM implant by Dr. Caryl Comes in October of 2019.  I then saw her this past February - she was doing well - getting ready to start B12 and iron infusions.   Presented with NSTEMI in July - cardiac cath showed mild LV dysfunction with apical ballooning suggestive of stress cardiomyopathy - EF of 45 to 50%. Coronaries are normal. Pattern suggestive of Takotsubo - beta blocker continued, ARB started. Needs an echo in about 8 weeks per Dr. Stanford Breed - noted no echo during that admission. Opacities noted on CXR. Concern her device needing reprogramming.  I then saw her for her post hospital visit - had been home about 2 weeks. Was doing ok but still have some spells that sounded pre syncopal. She has no children, siblings all have passed - very sad - she tries to stay independent. I got her a visit to see Dr. Caryl Comes to discuss re-programming.    The patient does not have symptoms concerning for COVID-19 infection (fever, chills, cough, or new shortness of breath).   Comes in today. Here with her neighbor/friend. She is bothered by her allergies - wonders if this is from  the medicines - but she has had allergies and actually took shots in the past. She has used Zyrtec in the past.  She still has lots of fatigue and some degree of pre syncope. HR remains at 55. She says it was our office that cancelled and said "it was too soon to see Dr. Caryl Comes".   She wonders if she is on too much medicine - this is the most she has ever been on. She has some hot flashes. Still feels extreme fatigue when she overexerts. HR is still 55.   Past Medical History:  Diagnosis Date  . Age-related macular degeneration, wet, both eyes ([place])   . Anxiety disorder 07/06/2014  . Arthritis    "all over" (04/27/2018)  . Atrial fibrillation ([place])    catheter ablation of SVT in 2002  . Benign essential HTN 07/06/2014  . Constipation, intermittent 08/04/2007  . CVA (cerebrovascular accident) ([place]) 2003   left brain; denies residual on 04/27/2018  . Depression   . GERD (gastroesophageal reflux disease)   . Heart murmur    hx (04/27/2018)  . Hypothyroidism 09/07/2014   "off RX now" (04/27/2018)  . Iron deficiency anemia   . Iron deficiency anemia, unspecified 01/02/2013  . Osteoarthritis   . Osteoporosis 01/11/2007  . Pneumonia    "one time; years and years ago" (04/27/2018)  . Presence of permanent cardiac pacemaker 04/27/2018    Dual Chamber  .  Right BBB/left ant fasc block     Past Surgical History:  Procedure Laterality Date  . APPENDECTOMY    . CATARACT EXTRACTION W/ INTRAOCULAR LENS  IMPLANT, BILATERAL Bilateral   . CHOLECYSTECTOMY OPEN    . COLONOSCOPY    . EYE SURGERY    . FRACTURE SURGERY    . HIP PINNING,CANNULATED Left 07/07/2014   Procedure: CANNULATED HIP PINNING;  Surgeon: Mauri Pole, MD;  Location: [facility];  Service: Orthopedics;  Laterality: Left;  . INSERT / REPLACE / REMOVE PACEMAKER  04/27/2018    Dual Chamber  . JOINT REPLACEMENT    . LAPAROSCOPIC OVARIAN CYSTECTOMY    . LEFT HEART CATH AND CORONARY ANGIOGRAPHY N/A 01/31/2019   Procedure: LEFT HEART CATH AND  CORONARY ANGIOGRAPHY;  Surgeon: Leonie Man, MD;  Location: [facility];  Service: Cardiovascular;  Laterality: N/A;  . NASAL SEPTUM SURGERY    . PACEMAKER IMPLANT N/A 04/27/2018   Procedure: PACEMAKER IMPLANT - Dual Chamber;  Surgeon: Deboraha Sprang, MD;  Location: [place] [facility];  Service: Cardiovascular;  Laterality: N/A;  . SVT ABLATION  2002  . TONSILLECTOMY    . TOTAL ABDOMINAL HYSTERECTOMY    . TOTAL HIP ARTHROPLASTY Right 02/23/2017   Procedure: RIGHT TOTAL HIP ARTHROPLASTY ANTERIOR APPROACH;  Surgeon: Paralee Cancel, MD;  [facility];  Service: Orthopedics;  Laterality: Right;  . VITRECTOMY Right 2008   dr Zadie Rhine.  Vitrectomy and removal of tissue.      Medications: Current Meds  Medication Sig  . acetaminophen (TYLENOL) 650 MG CR tablet Take 650 mg by mouth every 8 (eight) hours as needed for pain.   . bisoprolol (ZEBETA) 5 MG tablet Take 0.5 tablets (2.5 mg total) by mouth daily. (Patient taking differently: Take 5 mg by mouth daily. )  . Carboxymethylcellulose Sodium (THERATEARS) 0.25 % SOLN Place 1 drop into both eyes 3 (three) times daily as needed (for dry/irritated eyes.).  Marland Kitchen Cholecalciferol (VITAMIN D3) 2000 units TABS Take 2,000 Units by mouth daily.  Marland Kitchen dexlansoprazole (DEXILANT) 60 MG capsule Take 1 capsule (60 mg total) by mouth daily.  Marland Kitchen ELIQUIS 2.5 MG TABS tablet TAKE 1 TABLET TWICE DAILY  . hydrochlorothiazide (MICROZIDE) 12.5 MG capsule Take 1 capsule (12.5 mg total) by mouth daily.  Marland Kitchen LORazepam (ATIVAN) 1 MG tablet Take 1 tablet (1 mg total) by mouth at bedtime.  Marland Kitchen losartan (COZAAR) 25 MG tablet Take 1 tablet (25 mg total) by mouth daily.  . Multiple Vitamins-Minerals (PRESERVISION AREDS 2) CAPS Take 1 capsule by mouth 2 (two) times daily.  . nitroGLYCERIN (NITROSTAT) 0.4 MG SL tablet Place 1 tablet (0.4 mg total) under the tongue every 5 (five) minutes x 3 doses as needed for chest pain.  . NON FORMULARY Place 1 Dose into both eyes See admin  instructions. Receives injections into both eyes every 5-8 weeks at Dr. Serita Grit office [facility])     Allergies: Allergies  Allergen Reactions  . Propoxyphene N-Acetaminophen Nausea Only  . Celecoxib Itching and Rash  . Fexofenadine Rash  . Penicillins Hives and Rash    Has patient had a PCN reaction causing immediate rash, facial/tongue/throat swelling, SOB or lightheadedness with hypotension: Yes Has patient had a PCN reaction causing severe rash involving mucus membranes or skin necrosis: No Has patient had a PCN reaction that required hospitalization: No Has patient had a PCN reaction occurring within the last 10 years: No If all of the above answers are "NO", then may  proceed with Cephalosporin use.     Social History: The patient  reports that she has never smoked. She has never used smokeless tobacco. She reports current alcohol use. She reports that she does not use drugs.   Family History: The patient's family history includes Dementia in her sister and sister; Early death in her mother; Heart attack in her brother and father; Heart disease in her sister; Kidney disease in her mother; Nephritis in her mother.   Review of Systems: Please see the history of present illness.   All other systems are reviewed and negative.   Physical Exam: VS:  BP 132/70   Pulse (!) 58   Ht 5\' 4"  (1.626 m)   Wt 124 lb 6.4 oz (56.4 kg)   SpO2 97%   BMI 21.35 kg/m  .  BMI Body mass index is 21.35 kg/m.  Wt Readings from Last 3 Encounters:  03/14/19 124 lb 6.4 oz (56.4 kg)  02/13/19 124 lb (56.2 kg)  02/08/19 123 lb 8 oz (56 kg)    General: Pleasant. Alert and in no acute distress.   HEENT: Normal.  Neck: Supple, no JVD, carotid bruits, or masses noted.  Cardiac: Regular rate and rhythm. No murmurs, rubs, or gallops. No edema.  Respiratory:  Lungs are clear to auscultation bilaterally with normal work of breathing.  GI: Soft and nontender.  MS: No deformity or  atrophy. Gait and ROM intact.  Skin: Warm and dry. Color is normal.  Neuro:  Strength and sensation are intact and no gross focal deficits noted.  Psych: Alert, appropriate and with normal affect.   LABORATORY DATA:  EKG:  EKG is not ordered today.   Lab Results  Component Value Date   WBC 6.2 02/08/2019   HGB 11.7 (L) 02/08/2019   HCT 34.7 (L) 02/08/2019   PLT 177.0 02/08/2019   GLUCOSE 78 02/08/2019   CHOL 148 01/31/2019   TRIG 36 01/31/2019   HDL 66 01/31/2019   LDLCALC 75 01/31/2019   ALT 16 02/08/2019   AST 21 02/08/2019   NA 137 02/08/2019   K 4.5 02/08/2019   CL 102 02/08/2019   CREATININE 0.88 02/08/2019   BUN 23 02/08/2019   CO2 26 02/08/2019   TSH 3.690 02/13/2019   INR 0.92 01/02/2013     BNP (last 3 results) Recent Labs    01/30/19 1545  BNP 168.5*    ProBNP (last 3 results) No results for input(s): PROBNP in the last 8760 hours.   Other Studies Reviewed Today:  ECHO IMPRESSIONS 01/2019   1. Left atrial size was severely dilated.  2. Right atrial size was severely dilated.  3. Trivial pericardial effusion is present.  4. Mild mitral valve prolapse.  5. The mitral valve is myxomatous. Mitral valve regurgitation is moderate by color flow Doppler.  6. Tricuspid valve regurgitation is moderate-severe.  7. The aortic root and ascending aorta are normal in size and structure.  8. The left ventricle has normal systolic function, with an ejection fraction of 55-60%.  LEFT HEART CATH AND CORONARY ANGIOGRAPHY 01/2019  Conclusion    There is mild left ventricular systolic dysfunction -with apical ballooning in Takotsubo pattern. The left ventricular ejection fraction is 45-50% by visual estimate.  LV end diastolic pressure is normal.  Angiographically normal coronary arteries with a large right dominant system  SUMMARY Angiographically normal coronary arteries Mildly reduced LVEF of roughly 45% and Takotsubo pattern --> suspect  stress-induced cardiomyopathy   RECOMMENDATIONS  Return to nursing  unit for ongoing care after sheath removal.       Assessment/Plan:  1. Recent NSTEMI - s/p cath - no coronary disease - noted Takotsubo - on beta blocker/low dose ARB - her follow up echo looks good - EF is normal - do not see reason to get follow up study in a few months.  She is hopeful to not be on this current regimen long term, for now, no changes were made.   2. Pre syncope - this is persistent - she is asking about pacemaker reprogramming - will get her visit with Dr. Caryl Comes rearranged. See #5  3. Persistent AF - managed with rate control and is on Eliquis. CHADSVASC of at least 6. She needs labs added to her next visit - BMET & CBC.   4.  Abnormal chest CT - told me at our last visit that she had seen her PCP and had follow up study. Not discussed today.   5. Hx tachy/brady syndrome - has underlying PPM in place. She has only an RV pacemaker lead and has had her rate lowered from 60 to 55 and has had activated a rest rate of 50. I do not know if she has rate responsiveness capability. HR now in the 50's. May be contributing to her symptoms. PPM interrogated in when in the ER last month and there was concern she needed reprogramming. Will get her visit with Dr. Caryl Comes added back.   6. HTN - BP is ok - she is not interested in further titration of medicines and in fact, would like to be on less.    7. Anemia - has been on IV iron.   8. History of CVA  9. Allergies - ok to use Zyrtec - she has used this in the past.   10. COVID-19 Education: The signs and symptoms of COVID-19 were discussed with the patient and how to seek care for testing (follow up with PCP or arrange E-visit).  The importance of social distancing, staying at home, hand hygiene and wearing a mask when out in public were discussed today.  Current medicines are reviewed with the patient today.  The patient does not have concerns  regarding medicines other than what has been noted above.  The following changes have been made:  See above.  Labs/ tests ordered today include:   No orders of the defined types were placed in this encounter.    Disposition:   FU with Dr. Caryl Comes next week; Dr. Marlou Porch in 2 months. I am happy to see back as needed.   Patient is agreeable to this plan and will call if any problems develop in the interim.   SignedTruitt Merle, NP  03/14/2019 4:30 PM  [place] [address] [place], [place]  [address] Phone: [phone] Fax: [phone]

## 2019-03-14 ENCOUNTER — Other Ambulatory Visit: Payer: Self-pay | Admitting: *Deleted

## 2019-03-14 ENCOUNTER — Encounter: Payer: Self-pay | Admitting: Nurse Practitioner

## 2019-03-14 ENCOUNTER — Ambulatory Visit (INDEPENDENT_AMBULATORY_CARE_PROVIDER_SITE_OTHER): Payer: Medicare HMO | Admitting: Nurse Practitioner

## 2019-03-14 ENCOUNTER — Other Ambulatory Visit: Payer: Self-pay

## 2019-03-14 VITALS — BP 132/70 | HR 58 | Ht 64.0 in | Wt 124.4 lb

## 2019-03-14 DIAGNOSIS — Z8673 Personal history of transient ischemic attack (TIA), and cerebral infarction without residual deficits: Secondary | ICD-10-CM

## 2019-03-14 DIAGNOSIS — R918 Other nonspecific abnormal finding of lung field: Secondary | ICD-10-CM | POA: Diagnosis not present

## 2019-03-14 DIAGNOSIS — Z7189 Other specified counseling: Secondary | ICD-10-CM

## 2019-03-14 DIAGNOSIS — I252 Old myocardial infarction: Secondary | ICD-10-CM | POA: Diagnosis not present

## 2019-03-14 DIAGNOSIS — I5181 Takotsubo syndrome: Secondary | ICD-10-CM

## 2019-03-14 DIAGNOSIS — R55 Syncope and collapse: Secondary | ICD-10-CM

## 2019-03-14 DIAGNOSIS — I10 Essential (primary) hypertension: Secondary | ICD-10-CM

## 2019-03-14 DIAGNOSIS — I4821 Permanent atrial fibrillation: Secondary | ICD-10-CM

## 2019-03-14 DIAGNOSIS — I4819 Other persistent atrial fibrillation: Secondary | ICD-10-CM

## 2019-03-14 DIAGNOSIS — D649 Anemia, unspecified: Secondary | ICD-10-CM

## 2019-03-14 DIAGNOSIS — I495 Sick sinus syndrome: Secondary | ICD-10-CM

## 2019-03-14 DIAGNOSIS — Z79899 Other long term (current) drug therapy: Secondary | ICD-10-CM

## 2019-03-14 DIAGNOSIS — Z7901 Long term (current) use of anticoagulants: Secondary | ICD-10-CM

## 2019-03-14 DIAGNOSIS — Z950 Presence of cardiac pacemaker: Secondary | ICD-10-CM | POA: Diagnosis not present

## 2019-03-14 NOTE — Patient Instructions (Addendum)
After Visit Summary:  We will be checking the following labs today - NONE   Medication Instructions:    Continue with your current medicines.   Ok to use Zyrtec, Claritin, Xzal - no decongestants.    If you need a refill on your cardiac medications before your next appointment, please call your pharmacy.     Testing/Procedures To Be Arranged:  N/A  Follow-Up:   See Dr. Caryl Comes next week.   See Dr. Marlou Porch in about 2 months.     At [facility], you and your health needs are our priority.  As part of our continuing mission to provide you with exceptional heart care, we have created designated Provider Care Teams.  These Care Teams include your primary Cardiologist (physician) and Advanced Practice Providers (APPs -  Physician Assistants and Nurse Practitioners) who all work together to provide you with the care you need, when you need it.  Special Instructions:  . Stay safe, stay home, wash your hands for at least 20 seconds and wear a mask when out in public.  . It was good to talk with you today.    Call the [place] office at [phone] if you have any questions, problems or concerns.

## 2019-03-15 DIAGNOSIS — H353211 Exudative age-related macular degeneration, right eye, with active choroidal neovascularization: Secondary | ICD-10-CM | POA: Diagnosis not present

## 2019-03-15 DIAGNOSIS — H353231 Exudative age-related macular degeneration, bilateral, with active choroidal neovascularization: Secondary | ICD-10-CM | POA: Diagnosis not present

## 2019-03-15 DIAGNOSIS — H353221 Exudative age-related macular degeneration, left eye, with active choroidal neovascularization: Secondary | ICD-10-CM | POA: Diagnosis not present

## 2019-03-23 ENCOUNTER — Encounter: Payer: Medicare HMO | Admitting: Internal Medicine

## 2019-03-23 ENCOUNTER — Other Ambulatory Visit: Payer: Medicare HMO

## 2019-03-24 ENCOUNTER — Ambulatory Visit (INDEPENDENT_AMBULATORY_CARE_PROVIDER_SITE_OTHER): Payer: Medicare HMO | Admitting: Internal Medicine

## 2019-03-24 ENCOUNTER — Encounter: Payer: Self-pay | Admitting: Internal Medicine

## 2019-03-24 ENCOUNTER — Other Ambulatory Visit: Payer: Medicare HMO | Admitting: *Deleted

## 2019-03-24 ENCOUNTER — Other Ambulatory Visit: Payer: Self-pay

## 2019-03-24 VITALS — BP 124/68 | HR 56 | Ht 64.0 in | Wt 125.0 lb

## 2019-03-24 DIAGNOSIS — I4821 Permanent atrial fibrillation: Secondary | ICD-10-CM

## 2019-03-24 DIAGNOSIS — I495 Sick sinus syndrome: Secondary | ICD-10-CM | POA: Diagnosis not present

## 2019-03-24 DIAGNOSIS — Z79899 Other long term (current) drug therapy: Secondary | ICD-10-CM

## 2019-03-24 DIAGNOSIS — I5181 Takotsubo syndrome: Secondary | ICD-10-CM | POA: Diagnosis not present

## 2019-03-24 DIAGNOSIS — E109 Type 1 diabetes mellitus without complications: Secondary | ICD-10-CM | POA: Diagnosis not present

## 2019-03-24 DIAGNOSIS — Z950 Presence of cardiac pacemaker: Secondary | ICD-10-CM | POA: Diagnosis not present

## 2019-03-24 NOTE — Progress Notes (Signed)
Patient Care Team: Briscoe Deutscher, DO as PCP - General (Family Medicine) Jerline Pain, MD as PCP - Cardiology (Cardiology) Deboraha Sprang, MD as PCP - Electrophysiology (Cardiology)   HPI  Megan Olsen is a 85 y.o. female Seen in follow-up for VVI pacemaker implanted 9/19 for tachybradycardia in the setting of permanent atrial fibrillation.    Admitted 7/20 with chest pain/non-STEMI apical ballooning concerning for Tako-Tsubo.  Complains of exercise intolerance.  No edema.  No chest pain.  DATE TEST EF   7/20 LHC  45-50 % CorArt normal  ? Tako-tsubo  7/20 Echo  55-65% LAE BiP vol 48 /RAE severe     Date Cr K Hgb  10/19 0.67 4.4 10.1  7/20 0.88 4.5 [id number]    Thromboembolic risk factors ( age -73, HTN-1, TIA/CVA-2, DM-1, Gender-1) for a CHADSVASc Score of >= 7  Records and Results Reviewed   Past Medical History:  Diagnosis Date  . Age-related macular degeneration, wet, both eyes ([place])   . Anxiety disorder 07/06/2014  . Arthritis    "all over" (04/27/2018)  . Atrial fibrillation ([place])    catheter ablation of SVT in 2002  . Benign essential HTN 07/06/2014  . Constipation, intermittent 08/04/2007  . CVA (cerebrovascular accident) ([place]) 2003   left brain; denies residual on 04/27/2018  . Depression   . GERD (gastroesophageal reflux disease)   . Heart murmur    hx (04/27/2018)  . Hypothyroidism 09/07/2014   "off RX now" (04/27/2018)  . Iron deficiency anemia   . Iron deficiency anemia, unspecified 01/02/2013  . Osteoarthritis   . Osteoporosis 01/11/2007  . Pneumonia    "one time; years and years ago" (04/27/2018)  . Presence of permanent cardiac pacemaker 04/27/2018    Dual Chamber  . Right BBB/left ant fasc block     Past Surgical History:  Procedure Laterality Date  . APPENDECTOMY    . CATARACT EXTRACTION W/ INTRAOCULAR LENS  IMPLANT, BILATERAL Bilateral   . CHOLECYSTECTOMY OPEN    . COLONOSCOPY    . EYE SURGERY    . FRACTURE SURGERY    . HIP  PINNING,CANNULATED Left 07/07/2014   Procedure: CANNULATED HIP PINNING;  Surgeon: Mauri Pole, MD;  [facility]: [facility];  Service: Orthopedics;  Laterality: Left;  . INSERT / REPLACE / REMOVE PACEMAKER  04/27/2018    Dual Chamber  . JOINT REPLACEMENT    . LAPAROSCOPIC OVARIAN CYSTECTOMY    . LEFT HEART CATH AND CORONARY ANGIOGRAPHY N/A 01/31/2019   Procedure: LEFT HEART CATH AND CORONARY ANGIOGRAPHY;  Surgeon: Leonie Man, MD;  Location: [place];  Service: Cardiovascular;  Laterality: N/A;  . NASAL SEPTUM SURGERY    . PACEMAKER IMPLANT N/A 04/27/2018   Procedure: PACEMAKER IMPLANT - Dual Chamber;  Surgeon: Deboraha Sprang, MD;  Location: [facility];  Service: Cardiovascular;  Laterality: N/A;  . SVT ABLATION  2002  . TONSILLECTOMY    . TOTAL ABDOMINAL HYSTERECTOMY    . TOTAL HIP ARTHROPLASTY Right 02/23/2017   Procedure: RIGHT TOTAL HIP ARTHROPLASTY ANTERIOR APPROACH;  Surgeon: Paralee Cancel, MD;  [facility];  Service: Orthopedics;  Laterality: Right;  . VITRECTOMY Right 2008   dr Zadie Rhine.  Vitrectomy and removal of tissue.     Current Meds  Medication Sig  . acetaminophen (TYLENOL) 650 MG CR tablet Take 650 mg by mouth every 8 (eight) hours as needed for pain.   . bisoprolol (ZEBETA) 5 MG tablet  Take 5 mg by mouth daily.  . Carboxymethylcellulose Sodium (THERATEARS) 0.25 % SOLN Place 1 drop into both eyes 3 (three) times daily as needed (for dry/irritated eyes.).  Marland Kitchen Cholecalciferol (VITAMIN D3) 2000 units TABS Take 2,000 Units by mouth daily.  Marland Kitchen dexlansoprazole (DEXILANT) 60 MG capsule Take 1 capsule (60 mg total) by mouth daily.  Marland Kitchen ELIQUIS 2.5 MG TABS tablet TAKE 1 TABLET TWICE DAILY  . hydrochlorothiazide (MICROZIDE) 12.5 MG capsule Take 1 capsule (12.5 mg total) by mouth daily.  Marland Kitchen LORazepam (ATIVAN) 1 MG tablet Take 1 tablet (1 mg total) by mouth at bedtime.  Marland Kitchen losartan (COZAAR) 25 MG tablet Take 1 tablet (25 mg total) by mouth daily.  . Multiple  Vitamins-Minerals (PRESERVISION AREDS 2) CAPS Take 1 capsule by mouth 2 (two) times daily.  . nitroGLYCERIN (NITROSTAT) 0.4 MG SL tablet Place 1 tablet (0.4 mg total) under the tongue every 5 (five) minutes x 3 doses as needed for chest pain.  . NON FORMULARY Place 1 Dose into both eyes See admin instructions. Receives injections into both eyes every 5-8 weeks at Dr. Serita Grit office [facility])    Allergies  Allergen Reactions  . Propoxyphene N-Acetaminophen Nausea Only  . Celecoxib Itching and Rash  . Fexofenadine Rash  . Penicillins Hives and Rash    Has patient had a PCN reaction causing immediate rash, facial/tongue/throat swelling, SOB or lightheadedness with hypotension: Yes Has patient had a PCN reaction causing severe rash involving mucus membranes or skin necrosis: No Has patient had a PCN reaction that required hospitalization: No Has patient had a PCN reaction occurring within the last 10 years: No If all of the above answers are "NO", then may proceed with Cephalosporin use.       Review of Systems negative except from HPI and PMH  Physical Exam BP 124/68   Pulse (!) 56   Ht 5\' 4"  (1.626 m)   Wt 125 lb (56.7 kg)   SpO2 96%   BMI 21.46 kg/m  Well developed and well nourished in no acute distress HENT normal Neck supple with JVP-flat Clear Device pocket well healed; without hematoma or erythema.  There is no tethering  Regular rate and rhythm, no murmur Abd-soft with active BS No Clubbing cyanosis   edema Skin-warm and dry A & Oriented  Grossly normal sensory and motor function  ECG afib with intermittent Vpacing -/08/40   Assessment and  Plan  Atrial fibrillation-slow ventricular response  Bradycardia  Pacemaker [employer]     Tolerating bisoprolol.  Impact on her heart rate has been profound.  Very blunted heart rate response.  We have reprogrammed lower rate from 55-65 and activated rate response at auto +0 and decreased the slope from  8--7 after we took her for a walk and her exertional rate was 95.   Marland Kitchen

## 2019-03-24 NOTE — Patient Instructions (Signed)

## 2019-03-25 LAB — CBC WITH DIFFERENTIAL/PLATELET
Basophils Absolute: 0 10*3/uL (ref 0.0–0.2)
Basos: 1 %
EOS (ABSOLUTE): 0.2 10*3/uL (ref 0.0–0.4)
Eos: 3 %
Hematocrit: 34.3 % (ref 34.0–46.6)
Hemoglobin: 11.1 g/dL (ref 11.1–15.9)
Immature Grans (Abs): 0 10*3/uL (ref 0.0–0.1)
Immature Granulocytes: 0 %
Lymphocytes Absolute: 0.6 10*3/uL — ABNORMAL LOW (ref 0.7–3.1)
Lymphs: 10 %
MCH: 31.7 pg (ref 26.6–33.0)
MCHC: 32.4 g/dL (ref 31.5–35.7)
MCV: 98 fL — ABNORMAL HIGH (ref 79–97)
Monocytes Absolute: 0.6 10*3/uL (ref 0.1–0.9)
Monocytes: 10 %
Neutrophils Absolute: 4 10*3/uL (ref 1.4–7.0)
Neutrophils: 76 %
Platelets: 167 10*3/uL (ref 150–450)
RBC: 3.5 x10E6/uL — ABNORMAL LOW (ref 3.77–5.28)
RDW: 12 % (ref 11.7–15.4)
WBC: 5.3 10*3/uL (ref 3.4–10.8)

## 2019-03-25 LAB — BASIC METABOLIC PANEL
BUN/Creatinine Ratio: 22 (ref 12–28)
BUN: 21 mg/dL (ref 8–27)
CO2: 25 mmol/L (ref 20–29)
Calcium: 9 mg/dL (ref 8.7–10.3)
Chloride: 101 mmol/L (ref 96–106)
Creatinine, Ser: 0.97 mg/dL (ref 0.57–1.00)
GFR calc Af Amer: 60 mL/min/{1.73_m2} (ref >59)
GFR calc non Af Amer: 52 mL/min/{1.73_m2} — ABNORMAL LOW (ref >59)
Glucose: 91 mg/dL (ref 65–99)
Potassium: 4.4 mmol/L (ref 3.5–5.2)
Sodium: 140 mmol/L (ref 134–144)

## 2019-04-24 ENCOUNTER — Other Ambulatory Visit: Payer: Self-pay | Admitting: Family Medicine

## 2019-04-24 NOTE — Telephone Encounter (Signed)
See note

## 2019-04-24 NOTE — Telephone Encounter (Signed)
Requested medication (s) are due for refill today: yes  Requested medication (s) are on the active medication list: yes  Last refill:  01/30/2019  Future visit scheduled: yes  Notes to clinic:  Ordering provider and pcp are different   Requested Prescriptions  Pending Prescriptions Disp Refills   losartan (COZAAR) 25 MG tablet 30 tablet 3    Sig: Take 1 tablet (25 mg total) by mouth daily.     Cardiovascular:  Angiotensin Receptor Blockers Passed - 04/24/2019 10:09 AM      Passed - Cr in normal range and within 180 days    Creatinine, Ser  Date Value Ref Range Status  03/24/2019 0.97 0.57 - 1.00 mg/dL Final         Passed - K in normal range and within 180 days    Potassium  Date Value Ref Range Status  03/24/2019 4.4 3.5 - 5.2 mmol/L Final         Passed - Patient is not pregnant      Passed - Last BP in normal range    BP Readings from Last 1 Encounters:  03/24/19 124/68         Passed - Valid encounter within last 6 months    Recent Outpatient Visits          2 months ago Hospital discharge follow-up   [place], [place], [place]   8 months ago Benign essential HTN   [place], [place]   11 months ago Hospital discharge follow-up   [place], [place]   1 year ago Chronic atrial fibrillation [facility])   [place], [place], DO   1 year ago Paroxysmal atrial fibrillation ([place])   [facility] [employer], Doretha Sou, MD      Future Appointments            In 2 weeks Briscoe Deutscher, [place], [place]   In 2 months [place], Malka So, MD [employer] at [place], [place]   In 2 months [place], MD [place], [facility]

## 2019-04-24 NOTE — Telephone Encounter (Signed)
Copied from [place] [id number]. Topic: Quick Communication - Rx Refill/Question >> Apr 24, 2019  9:52 AM Yvette Rack wrote: Medication: losartan (COZAAR) 25 MG tablet  Has the patient contacted their pharmacy? no  Preferred Pharmacy (with phone number or street name): [place], [place] [phone] (Phone) [phone] (Fax)  Agent: Please be advised that RX refills may take up to 3 business days. We ask that you follow-up with your pharmacy.

## 2019-04-25 MED ORDER — LOSARTAN POTASSIUM 25 MG PO TABS: 25.0000 mg | ORAL_TABLET | Freq: Every day | ORAL | 3 refills | Status: DC

## 2019-05-03 ENCOUNTER — Ambulatory Visit (INDEPENDENT_AMBULATORY_CARE_PROVIDER_SITE_OTHER): Payer: Medicare HMO | Admitting: *Deleted

## 2019-05-03 DIAGNOSIS — Z8673 Personal history of transient ischemic attack (TIA), and cerebral infarction without residual deficits: Secondary | ICD-10-CM

## 2019-05-03 DIAGNOSIS — I495 Sick sinus syndrome: Secondary | ICD-10-CM

## 2019-05-03 LAB — CUP PACEART REMOTE DEVICE CHECK
Battery Remaining Longevity: 118 mo
Battery Remaining Percentage: 95.5 %
Battery Voltage: 3.01 V
Brady Statistic RV Percent Paced: 98 %
Date Time Interrogation Session: 20201013060012
Implantable Lead Implant Date: 20191009
Implantable Lead Location: 753860
Implantable Lead Model: 1948
Implantable Pulse Generator Implant Date: 20191009
Lead Channel Impedance Value: 590 Ohm
Lead Channel Pacing Threshold Amplitude: 0.75 V
Lead Channel Pacing Threshold Pulse Width: 0.5 ms
Lead Channel Sensing Intrinsic Amplitude: 12 mV
Lead Channel Setting Pacing Amplitude: 2.5 V
Lead Channel Setting Pacing Pulse Width: 0.5 ms
Lead Channel Setting Sensing Sensitivity: 2 mV
Pulse Gen Model: 1272
Pulse Gen Serial Number: 9067558

## 2019-05-10 DIAGNOSIS — H353211 Exudative age-related macular degeneration, right eye, with active choroidal neovascularization: Secondary | ICD-10-CM | POA: Diagnosis not present

## 2019-05-10 DIAGNOSIS — H353231 Exudative age-related macular degeneration, bilateral, with active choroidal neovascularization: Secondary | ICD-10-CM | POA: Diagnosis not present

## 2019-05-10 DIAGNOSIS — H353221 Exudative age-related macular degeneration, left eye, with active choroidal neovascularization: Secondary | ICD-10-CM | POA: Diagnosis not present

## 2019-05-10 NOTE — Progress Notes (Signed)
Megan Olsen is a 85 y.o. female is here for follow up.  History of Present Illness:   HPI: See Assessment and Plan section for Problem Based Charting of issues discussed today.   Health Maintenance Due  Topic Date Due  . INFLUENZA VACCINE  02/18/2019   Depression screen [facility] 2/9 08/22/2018 05/25/2018 01/26/2018  Decreased Interest 1 0 0  Down, Depressed, Hopeless 0 0 0  PHQ - 2 Score 1 0 0  Altered sleeping 1 1 0  Tired, decreased energy 1 1 0  Change in appetite 0 0 0  Feeling bad or failure about yourself  0 0 0  Trouble concentrating 0 1 0  Moving slowly or fidgety/restless 0 0 0  Suicidal thoughts 0 0 0  PHQ-9 Score 3 3 0  Difficult doing work/chores Not difficult at all - Not difficult at all   PMHx, SurgHx, SocialHx, FamHx, Medications, and Allergies were reviewed in the Visit Navigator and updated as appropriate.   Patient Active Problem List   Diagnosis Date Noted  . NSTEMI (non-ST elevated myocardial infarction) ([place]) 01/30/2019  . B12 deficiency 08/22/2018  . Facet arthritis, degenerative, cervical spine 08/22/2018  . Tachy-brady syndrome ([place]) 04/27/2018  . Cervical spondylosis without myelopathy 03/11/2018  . Myofascial pain syndrome 03/11/2018  . Cervicalgia 03/11/2018  . Insomnia 01/26/2018  . High risk medication use 01/26/2018  . Macular degeneration of both eyes 01/26/2018  . S/P right THA, AA 02/23/2017  . Hypothyroidism 09/07/2014  . A-fib [facility]), s/p catheter ablation of SVT in 2002 07/06/2014  . History of fracture of left hip 07/06/2014  . Benign essential HTN 07/06/2014  . GERD (gastroesophageal reflux disease) 07/06/2014  . Anxiety disorder 07/06/2014  . History of CVA (cerebrovascular accident), without residual effects 07/06/2014  . Iron deficiency anemia 01/02/2013  . Constipation, intermittent 08/04/2007  . Depression 01/11/2007  . Allergic rhinitis 01/11/2007  . Osteoarthritis 01/11/2007  . Osteoporosis 01/11/2007   Social History    Tobacco Use  . Smoking status: Never Smoker  . Smokeless tobacco: Never Used  Substance Use Topics  . Alcohol use: Yes    Comment: 04/27/2018 "a beer q 3 months or so; if that"  . Drug use: Never   Current Medications and Allergies   Current Outpatient Medications:  .  acetaminophen (TYLENOL) 650 MG CR tablet, Take 650 mg by mouth every 8 (eight) hours as needed for pain. , Disp: , Rfl:  .  bisoprolol (ZEBETA) 5 MG tablet, Take 5 mg by mouth daily., Disp: , Rfl:  .  Carboxymethylcellulose Sodium (THERATEARS) 0.25 % SOLN, Place 1 drop into both eyes 3 (three) times daily as needed (for dry/irritated eyes.)., Disp: , Rfl:  .  Cholecalciferol (VITAMIN D3) 2000 units TABS, Take 2,000 Units by mouth daily., Disp: , Rfl:  .  dexlansoprazole (DEXILANT) 60 MG capsule, Take 1 capsule (60 mg total) by mouth daily., Disp: 90 capsule, Rfl: 3 .  ELIQUIS 2.5 MG TABS tablet, TAKE 1 TABLET TWICE DAILY, Disp: 180 tablet, Rfl: 1 .  hydrochlorothiazide (MICROZIDE) 12.5 MG capsule, Take 1 capsule (12.5 mg total) by mouth daily., Disp: 90 capsule, Rfl: 2 .  LORazepam (ATIVAN) 1 MG tablet, Take 1 tablet (1 mg total) by mouth at bedtime., Disp: 90 tablet, Rfl: 1 .  Multiple Vitamins-Minerals (PRESERVISION AREDS 2) CAPS, Take 1 capsule by mouth 2 (two) times daily., Disp: 90 capsule, Rfl: 3 .  nitroGLYCERIN (NITROSTAT) 0.4 MG SL tablet, Place 1 tablet (0.4 mg total) under  the tongue every 5 (five) minutes x 3 doses as needed for chest pain., Disp: 30 tablet, Rfl: 0 .  NON FORMULARY, Place 1 Dose into both eyes See admin instructions. Receives injections into both eyes every 5-8 weeks at Dr. Serita Grit office [facility]), Disp: , Rfl:  .  ipratropium (ATROVENT) 0.03 % nasal spray, Place 2 sprays into both nostrils every 12 (twelve) hours., Disp: 30 mL, Rfl: 12 .  losartan (COZAAR) 25 MG tablet, Take 1 tablet (25 mg total) by mouth daily. (Patient not taking: Reported on 05/12/2019), Disp: 30 tablet,  Rfl: 3   Allergies  Allergen Reactions  . Propoxyphene N-Acetaminophen Nausea Only  . Celecoxib Itching and Rash  . Fexofenadine Rash  . Penicillins Hives and Rash    Has patient had a PCN reaction causing immediate rash, facial/tongue/throat swelling, SOB or lightheadedness with hypotension: Yes Has patient had a PCN reaction causing severe rash involving mucus membranes or skin necrosis: No Has patient had a PCN reaction that required hospitalization: No Has patient had a PCN reaction occurring within the last 10 years: No If all of the above answers are "NO", then may proceed with Cephalosporin use.    Review of Systems   Pertinent items are noted in the HPI. Otherwise, a complete ROS is negative.  Vitals   Vitals:   05/12/19 1102  BP: 122/66  Pulse: 64  Temp: 98.1 F (36.7 C)  TempSrc: Other (Comment)  SpO2: 95%  Weight: 125 lb 6.4 oz (56.9 kg)  Height: 5\' 4"  (1.626 m)     Body mass index is 21.52 kg/m.  Physical Exam   Physical Exam Vitals signs and nursing note reviewed.  HENT:     Head: Normocephalic and atraumatic.  Eyes:     Pupils: Pupils are equal, round, and reactive to light.  Neck:     Musculoskeletal: Normal range of motion and neck supple.  Cardiovascular:     Rate and Rhythm: Normal rate.  Pulmonary:     Effort: Pulmonary effort is normal.  Abdominal:     Palpations: Abdomen is soft.  Skin:    General: Skin is warm.  Psychiatric:        Behavior: Behavior normal.    Assessment and Plan   Megan Olsen was seen today for follow-up.  Diagnoses and all orders for this visit:  Permanent atrial fibrillation (HCC) -     Comprehensive metabolic panel -     Lipid panel -     Magnesium  Acquired hypothyroidism -     TSH -     T4, free  Localized osteoporosis without current pathological fracture  History of CVA (cerebrovascular accident), without residual effects -     Lipid panel  Generalized anxiety disorder  B12 deficiency -     CBC  with Differential/Platelet -     Vitamin B12  Other iron deficiency anemia -     CBC with Differential/Platelet -     Iron, TIBC and Ferritin Panel  High risk medication use  Primary insomnia  Vitamin D deficiency -     VITAMIN D 25 Hydroxy (Vit-D Deficiency, Fractures)  Need for immunization against influenza -     Flu Vaccine QUAD High Dose(Fluad)  Vasomotor rhinitis -     ipratropium (ATROVENT) 0.03 % nasal spray; Place 2 sprays into both nostrils every 12 (twelve) hours.  Doing well. Medications tolerated. No side effects. No new symptoms. Continue current regimen.   . Orders and follow up as documented  in [place], reviewed diet, exercise and weight control, cardiovascular risk and specific lipid/LDL goals reviewed, reviewed medications and side effects in detail.  . Reviewed expectations re: course of current medical issues. . Outlined signs and symptoms indicating need for more acute intervention. . Patient verbalized understanding and all questions were answered. . Patient received an After Visit Summary.  Briscoe Deutscher, DO [place] [place] [facility] 05/12/2019

## 2019-05-12 ENCOUNTER — Encounter: Payer: Self-pay | Admitting: Family Medicine

## 2019-05-12 ENCOUNTER — Other Ambulatory Visit: Payer: Self-pay

## 2019-05-12 ENCOUNTER — Telehealth: Payer: Self-pay | Admitting: *Deleted

## 2019-05-12 ENCOUNTER — Ambulatory Visit (INDEPENDENT_AMBULATORY_CARE_PROVIDER_SITE_OTHER): Payer: Medicare HMO | Admitting: Family Medicine

## 2019-05-12 VITALS — BP 122/66 | HR 64 | Temp 98.1°F | Ht 64.0 in | Wt 125.4 lb

## 2019-05-12 DIAGNOSIS — Z8673 Personal history of transient ischemic attack (TIA), and cerebral infarction without residual deficits: Secondary | ICD-10-CM

## 2019-05-12 DIAGNOSIS — Z23 Encounter for immunization: Secondary | ICD-10-CM | POA: Diagnosis not present

## 2019-05-12 DIAGNOSIS — E039 Hypothyroidism, unspecified: Secondary | ICD-10-CM | POA: Diagnosis not present

## 2019-05-12 DIAGNOSIS — F5101 Primary insomnia: Secondary | ICD-10-CM

## 2019-05-12 DIAGNOSIS — I4821 Permanent atrial fibrillation: Secondary | ICD-10-CM | POA: Diagnosis not present

## 2019-05-12 DIAGNOSIS — J300 Vasomotor rhinitis: Secondary | ICD-10-CM

## 2019-05-12 DIAGNOSIS — E538 Deficiency of other specified B group vitamins: Secondary | ICD-10-CM | POA: Diagnosis not present

## 2019-05-12 DIAGNOSIS — Z79899 Other long term (current) drug therapy: Secondary | ICD-10-CM | POA: Diagnosis not present

## 2019-05-12 DIAGNOSIS — E559 Vitamin D deficiency, unspecified: Secondary | ICD-10-CM

## 2019-05-12 DIAGNOSIS — M816 Localized osteoporosis [Lequesne]: Secondary | ICD-10-CM

## 2019-05-12 DIAGNOSIS — D508 Other iron deficiency anemias: Secondary | ICD-10-CM

## 2019-05-12 DIAGNOSIS — F411 Generalized anxiety disorder: Secondary | ICD-10-CM | POA: Diagnosis not present

## 2019-05-12 LAB — CBC WITH DIFFERENTIAL/PLATELET
Basophils Absolute: 0 10*3/uL (ref 0.0–0.1)
Basophils Relative: 0.7 % (ref 0.0–3.0)
Eosinophils Absolute: 0.1 10*3/uL (ref 0.0–0.7)
Eosinophils Relative: 2.9 % (ref 0.0–5.0)
HCT: 36.2 % (ref 36.0–46.0)
Hemoglobin: 12.1 g/dL (ref 12.0–15.0)
Lymphocytes Relative: 12.3 % (ref 12.0–46.0)
Lymphs Abs: 0.6 10*3/uL — ABNORMAL LOW (ref 0.7–4.0)
MCHC: 33.5 g/dL (ref 30.0–36.0)
MCV: 92.9 fl (ref 78.0–100.0)
Monocytes Absolute: 0.6 10*3/uL (ref 0.1–1.0)
Monocytes Relative: 12 % (ref 3.0–12.0)
Neutro Abs: 3.5 10*3/uL (ref 1.4–7.7)
Neutrophils Relative %: 72.1 % (ref 43.0–77.0)
Platelets: 170 10*3/uL (ref 150.0–400.0)
RBC: 3.89 Mil/uL (ref 3.87–5.11)
RDW: 13 % (ref 11.5–15.5)
WBC: 4.8 10*3/uL (ref 4.0–10.5)

## 2019-05-12 LAB — VITAMIN D 25 HYDROXY (VIT D DEFICIENCY, FRACTURES): VITD: 45.31 ng/mL (ref 30.00–100.00)

## 2019-05-12 LAB — COMPREHENSIVE METABOLIC PANEL
ALT: 11 U/L (ref 0–35)
AST: 19 U/L (ref 0–37)
Albumin: 4.2 g/dL (ref 3.5–5.2)
Alkaline Phosphatase: 91 U/L (ref 39–117)
BUN: 14 mg/dL (ref 6–23)
CO2: 28 mEq/L (ref 19–32)
Calcium: 8.9 mg/dL (ref 8.4–10.5)
Chloride: 97 mEq/L (ref 96–112)
Creatinine, Ser: 0.74 mg/dL (ref 0.40–1.20)
GFR: 73.81 mL/min (ref >60.00)
Glucose, Bld: 85 mg/dL (ref 70–99)
Potassium: 3.9 mEq/L (ref 3.5–5.1)
Sodium: 134 mEq/L — ABNORMAL LOW (ref 135–145)
Total Bilirubin: 0.4 mg/dL (ref 0.2–1.2)
Total Protein: 6.3 g/dL (ref 6.0–8.3)

## 2019-05-12 LAB — T4, FREE: Free T4: 3.95 ng/dL — ABNORMAL HIGH (ref 0.60–1.60)

## 2019-05-12 LAB — LIPID PANEL
Cholesterol: 163 mg/dL (ref 0–200)
HDL: 66.8 mg/dL (ref >39.00)
LDL Cholesterol: 81 mg/dL (ref 0–99)
NonHDL: 96.56
Total CHOL/HDL Ratio: 2
Triglycerides: 77 mg/dL (ref 0.0–149.0)
VLDL: 15.4 mg/dL (ref 0.0–40.0)

## 2019-05-12 LAB — TSH: TSH: 3.08 u[IU]/mL (ref 0.35–4.50)

## 2019-05-12 LAB — VITAMIN B12: Vitamin B-12: 269 pg/mL (ref 211–911)

## 2019-05-12 LAB — MAGNESIUM: Magnesium: 1.8 mg/dL (ref 1.5–2.5)

## 2019-05-12 MED ORDER — IPRATROPIUM BROMIDE 0.03 % NA SOLN: 2.0000 | Freq: Two times a day (BID) | NASAL | 12 refills | Status: DC

## 2019-05-12 NOTE — Telephone Encounter (Signed)
Virtual Visit Pre-Appointment Phone Call  TELEPHONE CALL NOTE  TEREVA Megan Olsen has been deemed a candidate for a follow-up tele-health visit to limit community exposure during the Covid-19 pandemic. I spoke with the patient via phone to ensure availability of phone/video source, confirm preferred email & phone number, and discuss instructions and expectations.  I reminded KASHANDA MYRTLE to be prepared with any vital sign and/or heart rhythm information that could potentially be obtained via home monitoring, at the time of her visit. I reminded JASMEN DEVANE to expect a phone call prior to her visit.  Patient agrees to consent below.  Sheralyn Boatman, [place] 05/12/2019 4:56 PM   FULL LENGTH CONSENT FOR TELE-HEALTH VISIT   I hereby voluntarily request, consent and authorize [facility] and its employed or contracted physicians, physician assistants, nurse practitioners or other licensed health care professionals (the Practitioner), to provide me with telemedicine health care services (the "Services") as deemed necessary by the treating Practitioner. I acknowledge and consent to receive the Services by the Practitioner via telemedicine. I understand that the telemedicine visit will involve communicating with the Practitioner through live audiovisual communication technology and the disclosure of certain medical information by electronic transmission. I acknowledge that I have been given the opportunity to request an in-person assessment or other available alternative prior to the telemedicine visit and am voluntarily participating in the telemedicine visit.  I understand that I have the right to withhold or withdraw my consent to the use of telemedicine in the course of my care at any time, without affecting my right to future care or treatment, and that the Practitioner or I may terminate the telemedicine visit at any time. I understand that I have the right to inspect all information  obtained and/or recorded in the course of the telemedicine visit and may receive copies of available information for a reasonable fee.  I understand that some of the potential risks of receiving the Services via telemedicine include:  Marland Kitchen Delay or interruption in medical evaluation due to technological equipment failure or disruption; . Information transmitted may not be sufficient (e.g. poor resolution of images) to allow for appropriate medical decision making by the Practitioner; and/or  . In rare instances, security protocols could fail, causing a breach of personal health information.  Furthermore, I acknowledge that it is my responsibility to provide information about my medical history, conditions and care that is complete and accurate to the best of my ability. I acknowledge that Practitioner's advice, recommendations, and/or decision may be based on factors not within their control, such as incomplete or inaccurate data provided by me or distortions of diagnostic images or specimens that may result from electronic transmissions. I understand that the practice of medicine is not an exact science and that Practitioner makes no warranties or guarantees regarding treatment outcomes. I acknowledge that I will receive a copy of this consent concurrently upon execution via email to the email address I last provided but may also request a printed copy by calling the office of [place].    I understand that my insurance will be billed for this visit.   I have read or had this consent read to me. . I understand the contents of this consent, which adequately explains the benefits and risks of the Services being provided via telemedicine.  . I have been provided ample opportunity to ask questions regarding this consent and the Services and have had my questions answered to my satisfaction. . I give my  informed consent for the services to be provided through the use of telemedicine in my medical care   By participating in this telemedicine visit I agree to the above.

## 2019-05-13 LAB — IRON,TIBC AND FERRITIN PANEL
%SAT: 16 % (calc) (ref 16–45)
Ferritin: 19 ng/mL (ref 16–288)
Iron: 61 ug/dL (ref 45–160)
TIBC: 385 mcg/dL (calc) (ref 250–450)

## 2019-05-15 ENCOUNTER — Telehealth (INDEPENDENT_AMBULATORY_CARE_PROVIDER_SITE_OTHER): Payer: Medicare HMO | Admitting: Cardiology

## 2019-05-15 ENCOUNTER — Other Ambulatory Visit: Payer: Self-pay

## 2019-05-15 VITALS — BP 122/66 | Ht 64.0 in | Wt 125.0 lb

## 2019-05-15 DIAGNOSIS — I5181 Takotsubo syndrome: Secondary | ICD-10-CM

## 2019-05-15 DIAGNOSIS — Z7901 Long term (current) use of anticoagulants: Secondary | ICD-10-CM

## 2019-05-15 DIAGNOSIS — I4821 Permanent atrial fibrillation: Secondary | ICD-10-CM

## 2019-05-15 DIAGNOSIS — I495 Sick sinus syndrome: Secondary | ICD-10-CM

## 2019-05-15 DIAGNOSIS — Z950 Presence of cardiac pacemaker: Secondary | ICD-10-CM

## 2019-05-15 DIAGNOSIS — I10 Essential (primary) hypertension: Secondary | ICD-10-CM

## 2019-05-15 NOTE — Progress Notes (Signed)
Virtual Visit via Telephone Note   This visit type was conducted due to national recommendations for restrictions regarding the COVID-19 Pandemic (e.g. social distancing) in an effort to limit this patient's exposure and mitigate transmission in our community.  Due to her co-morbid illnesses, this patient is at least at moderate risk for complications without adequate follow up.  This format is felt to be most appropriate for this patient at this time.  The patient did not have access to video technology/had technical difficulties with video requiring transitioning to audio format only (telephone).  All issues noted in this document were discussed and addressed.  No physical exam could be performed with this format.  Please refer to the patient's chart for her  consent to telehealth for [facility].   Date:  05/15/2019   ID:  Megan Olsen, DOB 08/20/1927, MRN [id number]  Patient Location: Home Provider Location: Home  PCP:  Briscoe Deutscher, DO  Cardiologist:  Candee Furbish, MD  Electrophysiologist:  [name], MD   Evaluation Performed:  Follow-Up Visit  Chief Complaint:  AFIB floow up  History of Present Illness:    Megan Olsen is a 85 y.o. female with VVI pacemaker implanted 9/19 for tachybradycardia in the setting of permanent atrial fibrillation.  Thromboembolic risk factors ( age -3, HTN-1, TIA/CVA-2, DM-1, Gender-1) for a CHADSVASc Score of>= 7. On Eliquis (after prior declining)   Very blunted heart rate response. Dr. Caryl Comes reprogrammed lower rate from 55-65 and activated rate response at auto +0 and decreased the slope from 8--7 after we took her for a walk and her exertional rate was 95.  Admitted 7/20 with chest pain/non-STEMI apical ballooning concerning for Tako-Tsubo. Cardiac cath showed mild LV dysfunction with apical ballooning suggestive of stress cardiomyopathy - EF of 45 to 50%. Coronaries are normal. Pattern suggestive of Takotsubo - beta blocker continued.   Her follow up echo looks good - EF is normal. Now on HCTZ.  Fatigue seems to have improved-feels better after pacemaker change, worries about being on too much medication.  Blood pressure has been excellent 120s over 60s.  No dizziness no syncope.  She is on low-dose HCTZ 12.5 and bisoprolol 5 mg.  Her main worry seems to be macular degeneration.  She is getting injections in her eyes.  She has trouble seeing things up close.  She also had some trouble sleeping.  She does feel better after pacemaker change.  The patient does not have symptoms concerning for COVID-19 infection (fever, chills, cough, or new shortness of breath).    Past Medical History:  Diagnosis Date  . Age-related macular degeneration, wet, both eyes ([place])   . Anxiety disorder 07/06/2014  . Arthritis    "all over" (04/27/2018)  . Atrial fibrillation ([place])    catheter ablation of SVT in 2002  . Benign essential HTN 07/06/2014  . Constipation, intermittent 08/04/2007  . CVA (cerebrovascular accident) ([place]) 2003   left brain; denies residual on 04/27/2018  . Depression   . GERD (gastroesophageal reflux disease)   . Heart murmur    hx (04/27/2018)  . Hypothyroidism 09/07/2014   "off RX now" (04/27/2018)  . Iron deficiency anemia   . Iron deficiency anemia, unspecified 01/02/2013  . Osteoarthritis   . Osteoporosis 01/11/2007  . Pneumonia    "one time; years and years ago" (04/27/2018)  . Presence of permanent cardiac pacemaker 04/27/2018    Dual Chamber  . Right BBB/left ant fasc block    Past Surgical History:  Procedure Laterality Date  . APPENDECTOMY    . CATARACT EXTRACTION W/ INTRAOCULAR LENS  IMPLANT, BILATERAL Bilateral   . CHOLECYSTECTOMY OPEN    . COLONOSCOPY    . EYE SURGERY    . FRACTURE SURGERY    . HIP PINNING,CANNULATED Left 07/07/2014   Procedure: CANNULATED HIP PINNING;  Surgeon: Mauri Pole, MD;  Location: [facility];  Service: Orthopedics;  Laterality: Left;  . INSERT / REPLACE / REMOVE PACEMAKER   04/27/2018    Dual Chamber  . JOINT REPLACEMENT    . LAPAROSCOPIC OVARIAN CYSTECTOMY    . LEFT HEART CATH AND CORONARY ANGIOGRAPHY N/A 01/31/2019   Procedure: LEFT HEART CATH AND CORONARY ANGIOGRAPHY;  Surgeon: Leonie Man, MD;  Location: [place] [facility];  Service: Cardiovascular;  Laterality: N/A;  . NASAL SEPTUM SURGERY    . PACEMAKER IMPLANT N/A 04/27/2018   Procedure: PACEMAKER IMPLANT - Dual Chamber;  Surgeon: Deboraha Sprang, MD;  Location: [place] [facility];  Service: Cardiovascular;  Laterality: N/A;  . SVT ABLATION  2002  . TONSILLECTOMY    . TOTAL ABDOMINAL HYSTERECTOMY    . TOTAL HIP ARTHROPLASTY Right 02/23/2017   Procedure: RIGHT TOTAL HIP ARTHROPLASTY ANTERIOR APPROACH;  Surgeon: Paralee Cancel, MD;  Location: [facility];  Service: Orthopedics;  Laterality: Right;  . VITRECTOMY Right 2008   dr Zadie Rhine.  Vitrectomy and removal of tissue.      Current Meds  Medication Sig  . acetaminophen (TYLENOL) 650 MG CR tablet Take 650 mg by mouth every 8 (eight) hours as needed for pain.   . bisoprolol (ZEBETA) 5 MG tablet Take 5 mg by mouth daily.  . Carboxymethylcellulose Sodium (THERATEARS) 0.25 % SOLN Place 1 drop into both eyes 3 (three) times daily as needed (for dry/irritated eyes.).  Marland Kitchen Cholecalciferol (VITAMIN D3) 2000 units TABS Take 2,000 Units by mouth daily.  Marland Kitchen dexlansoprazole (DEXILANT) 60 MG capsule Take 1 capsule (60 mg total) by mouth daily.  Marland Kitchen ELIQUIS 2.5 MG TABS tablet TAKE 1 TABLET TWICE DAILY  . hydrochlorothiazide (MICROZIDE) 12.5 MG capsule Take 1 capsule (12.5 mg total) by mouth daily.  Marland Kitchen LORazepam (ATIVAN) 1 MG tablet Take 1 tablet (1 mg total) by mouth at bedtime.  . Multiple Vitamins-Minerals (PRESERVISION AREDS 2) CAPS Take 1 capsule by mouth 2 (two) times daily.  . nitroGLYCERIN (NITROSTAT) 0.4 MG SL tablet Place 1 tablet (0.4 mg total) under the tongue every 5 (five) minutes x 3 doses as needed for chest pain.  . NON FORMULARY Place 1 Dose into both eyes See  admin instructions. Receives injections into both eyes every 5-8 weeks at Dr. Serita Grit office [facility])     Allergies:   Propoxyphene n-acetaminophen, Celecoxib, Fexofenadine, and Penicillins   Social History   Tobacco Use  . Smoking status: Never Smoker  . Smokeless tobacco: Never Used  Substance Use Topics  . Alcohol use: Yes    Comment: 04/27/2018 "a beer q 3 months or so; if that"  . Drug use: Never     Family Hx: The patient's family history includes Dementia in her sister and sister; Early death in her mother; Heart attack in her brother and father; Heart disease in her sister; Kidney disease in her mother; Nephritis in her mother.  ROS:   Please see the history of present illness.    Denies any fevers chills nausea vomiting syncope bleeding All other systems reviewed and are negative.   Prior CV studies:   The following studies  were reviewed today: Echocardiogram July 2020 post catheterization which showed normal coronary arteries and ballooning of the apex  1. Left atrial size was severely dilated.  2. Right atrial size was severely dilated.  3. Trivial pericardial effusion is present.  4. Mild mitral valve prolapse.  5. The mitral valve is myxomatous. Mitral valve regurgitation is moderate by color flow Doppler.  6. Tricuspid valve regurgitation is moderate-severe.  7. The aortic root and ascending aorta are normal in size and structure.  8. The left ventricle has normal systolic function, with an ejection fraction of 55-60%.  Labs/Other Tests and Data Reviewed:    EKG:  An ECG dated 03/28/2019 was personally reviewed today and demonstrated:  Atrial fibrillation with ventricular pacing  Recent Labs: 01/30/2019: B Natriuretic Peptide 168.5 05/12/2019: ALT 11; BUN 14; Creatinine, Ser 0.74; Hemoglobin 12.1; Magnesium 1.8; Platelets 170.0; Potassium 3.9; Sodium 134; TSH 3.08   Recent Lipid Panel Lab Results  Component Value Date/Time   CHOL 163  05/12/2019 11:33 AM   TRIG 77.0 05/12/2019 11:33 AM   HDL 66.80 05/12/2019 11:33 AM   CHOLHDL 2 05/12/2019 11:33 AM   LDLCALC 81 05/12/2019 11:33 AM    Wt Readings from Last 3 Encounters:  05/12/19 125 lb (56.7 kg)  05/12/19 125 lb 6.4 oz (56.9 kg)  03/24/19 125 lb (56.7 kg)     Objective:    Vital Signs:  BP 122/66   Ht 5\' 4"  (1.626 m)   Wt 125 lb (56.7 kg)   BMI 21.46 kg/m    VITAL SIGNS:  reviewed able to quit full senses without difficulty.  Alert and oriented.  Pleasant.  ASSESSMENT & PLAN:    NSTEMI 01/2019   - Takotsubo  - No CAD on catheterization  - Type 2  - EF normal now.  Previously ballooning of the apex.  Persistent AFIB  - Now on Eliquis, doing well, blood work reviewed.  Hemoglobin stable.  No bleeding response.  Pacer  - DR. Caryl Comes  - Adjusted rate response.  This seems to have helped out for her.  She is feeling better.  Essential HTN  - good control.  Low-dose bisoprolol as well as HCTZ.  If blood pressure got too low, I would discontinue the HCTZ 12.5.  CVA history  - Eliquis, seems to be doing well.  Mitral regurgitation -Continue to monitor clinically and with echocardiogram.  Moderate in intensity.  COVID-19 Education: The signs and symptoms of COVID-19 were discussed with the patient and how to seek care for testing (follow up with PCP or arrange E-visit).  The importance of social distancing was discussed today.  Time:   Today, I have spent 15 minutes with the patient with telehealth technology discussing the above problems.     Medication Adjustments/Labs and Tests Ordered: Current medicines are reviewed at length with the patient today.  Concerns regarding medicines are outlined above.   Tests Ordered: No orders of the defined types were placed in this encounter.   Medication Changes: No orders of the defined types were placed in this encounter.   Follow Up:  Either In Person or Virtual in 6 month(s)  Signed, Candee Furbish, MD   05/15/2019 8:28 AM    [facility]

## 2019-05-15 NOTE — Progress Notes (Signed)
Remote pacemaker transmission.   

## 2019-05-15 NOTE — Patient Instructions (Signed)
Medication Instructions:  The current medical regimen is effective;  continue present plan and medications.  *If you need a refill on your cardiac medications before your next appointment, please call your pharmacy*  Follow-Up: At [facility], you and your health needs are our priority.  As part of our continuing mission to provide you with exceptional heart care, we have created designated Provider Care Teams.  These Care Teams include your primary Cardiologist (physician) and Advanced Practice Providers (APPs -  Physician Assistants and Nurse Practitioners) who all work together to provide you with the care you need, when you need it.  Your next appointment:   In 6 months  The format for your next appointment:   In person  Provider:   Truitt Merle, NP  Thank you for choosing [facility]!!

## 2019-06-26 DIAGNOSIS — H353211 Exudative age-related macular degeneration, right eye, with active choroidal neovascularization: Secondary | ICD-10-CM | POA: Diagnosis not present

## 2019-06-27 ENCOUNTER — Encounter: Payer: Medicare HMO | Admitting: Family Medicine

## 2019-06-29 DIAGNOSIS — H353221 Exudative age-related macular degeneration, left eye, with active choroidal neovascularization: Secondary | ICD-10-CM | POA: Diagnosis not present

## 2019-07-03 ENCOUNTER — Telehealth (INDEPENDENT_AMBULATORY_CARE_PROVIDER_SITE_OTHER): Payer: Self-pay | Admitting: Family Medicine

## 2019-07-03 NOTE — Telephone Encounter (Signed)
Pharmacy is calling in to request a refill for [place]: Nora # 2 in [employer]

## 2019-07-03 NOTE — Telephone Encounter (Signed)
See note. Patient will transfer to Dr. Elesa Massed in January

## 2019-07-04 NOTE — Telephone Encounter (Signed)
Ok to refill for one month. Thanks.

## 2019-07-04 NOTE — Telephone Encounter (Signed)
Ok to fill 

## 2019-07-05 ENCOUNTER — Other Ambulatory Visit: Payer: Self-pay

## 2019-07-05 MED ORDER — LINACLOTIDE 290 MCG PO CAPS: 290.0000 ug | ORAL_CAPSULE | Freq: Every day | ORAL | 0 refills | Status: DC

## 2019-07-05 NOTE — Telephone Encounter (Signed)
Rx sent to pharmacy   

## 2019-07-13 ENCOUNTER — Ambulatory Visit: Payer: Medicare HMO | Admitting: Cardiology

## 2019-08-01 LAB — CUP PACEART INCLINIC DEVICE CHECK
Date Time Interrogation Session: 20200904095050
Implantable Lead Implant Date: 20191009
Implantable Lead Location: 753860
Implantable Lead Model: 1948
Implantable Pulse Generator Implant Date: 20191009
Pulse Gen Model: 1272
Pulse Gen Serial Number: 9067558

## 2019-08-02 ENCOUNTER — Ambulatory Visit (INDEPENDENT_AMBULATORY_CARE_PROVIDER_SITE_OTHER): Payer: Medicare HMO | Admitting: *Deleted

## 2019-08-02 DIAGNOSIS — I495 Sick sinus syndrome: Secondary | ICD-10-CM

## 2019-08-02 DIAGNOSIS — H353211 Exudative age-related macular degeneration, right eye, with active choroidal neovascularization: Secondary | ICD-10-CM | POA: Diagnosis not present

## 2019-08-02 LAB — CUP PACEART REMOTE DEVICE CHECK
Date Time Interrogation Session: 20210112100748
Implantable Lead Implant Date: 20191009
Implantable Lead Location: 753860
Implantable Lead Model: 1948
Implantable Pulse Generator Implant Date: 20191009
Pulse Gen Model: 1272
Pulse Gen Serial Number: 9067558

## 2019-08-09 ENCOUNTER — Encounter: Payer: Self-pay | Admitting: Family Medicine

## 2019-08-09 ENCOUNTER — Other Ambulatory Visit: Payer: Self-pay

## 2019-08-09 ENCOUNTER — Ambulatory Visit (INDEPENDENT_AMBULATORY_CARE_PROVIDER_SITE_OTHER): Payer: Medicare HMO | Admitting: Family Medicine

## 2019-08-09 ENCOUNTER — Other Ambulatory Visit: Payer: Self-pay | Admitting: Family Medicine

## 2019-08-09 VITALS — BP 120/80 | HR 65 | Temp 97.4°F | Ht 64.0 in | Wt 126.5 lb

## 2019-08-09 DIAGNOSIS — F5101 Primary insomnia: Secondary | ICD-10-CM | POA: Diagnosis not present

## 2019-08-09 DIAGNOSIS — D508 Other iron deficiency anemias: Secondary | ICD-10-CM

## 2019-08-09 DIAGNOSIS — E039 Hypothyroidism, unspecified: Secondary | ICD-10-CM

## 2019-08-09 DIAGNOSIS — E559 Vitamin D deficiency, unspecified: Secondary | ICD-10-CM

## 2019-08-09 DIAGNOSIS — J301 Allergic rhinitis due to pollen: Secondary | ICD-10-CM | POA: Diagnosis not present

## 2019-08-09 DIAGNOSIS — E538 Deficiency of other specified B group vitamins: Secondary | ICD-10-CM

## 2019-08-09 DIAGNOSIS — E785 Hyperlipidemia, unspecified: Secondary | ICD-10-CM

## 2019-08-09 DIAGNOSIS — M816 Localized osteoporosis [Lequesne]: Secondary | ICD-10-CM | POA: Diagnosis not present

## 2019-08-09 DIAGNOSIS — H353221 Exudative age-related macular degeneration, left eye, with active choroidal neovascularization: Secondary | ICD-10-CM | POA: Diagnosis not present

## 2019-08-09 DIAGNOSIS — F411 Generalized anxiety disorder: Secondary | ICD-10-CM | POA: Diagnosis not present

## 2019-08-09 DIAGNOSIS — D72819 Decreased white blood cell count, unspecified: Secondary | ICD-10-CM

## 2019-08-09 DIAGNOSIS — K219 Gastro-esophageal reflux disease without esophagitis: Secondary | ICD-10-CM | POA: Diagnosis not present

## 2019-08-09 DIAGNOSIS — I10 Essential (primary) hypertension: Secondary | ICD-10-CM

## 2019-08-09 LAB — IBC + FERRITIN
Ferritin: 14.4 ng/mL (ref 10.0–291.0)
Iron: 86 ug/dL (ref 42–145)
Saturation Ratios: 20.5 % (ref 20.0–50.0)
Transferrin: 300 mg/dL (ref 212.0–360.0)

## 2019-08-09 LAB — COMPREHENSIVE METABOLIC PANEL
ALT: 11 U/L (ref 0–35)
AST: 17 U/L (ref 0–37)
Albumin: 4.1 g/dL (ref 3.5–5.2)
Alkaline Phosphatase: 115 U/L (ref 39–117)
BUN: 16 mg/dL (ref 6–23)
CO2: 29 mEq/L (ref 19–32)
Calcium: 9.1 mg/dL (ref 8.4–10.5)
Chloride: 103 mEq/L (ref 96–112)
Creatinine, Ser: 0.74 mg/dL (ref 0.40–1.20)
GFR: 73.77 mL/min (ref >60.00)
Glucose, Bld: 82 mg/dL (ref 70–99)
Potassium: 4.2 mEq/L (ref 3.5–5.1)
Sodium: 139 mEq/L (ref 135–145)
Total Bilirubin: 0.4 mg/dL (ref 0.2–1.2)
Total Protein: 6.4 g/dL (ref 6.0–8.3)

## 2019-08-09 LAB — LIPID PANEL
Cholesterol: 146 mg/dL (ref 0–200)
HDL: 65.6 mg/dL (ref >39.00)
LDL Cholesterol: 62 mg/dL (ref 0–99)
NonHDL: 80.17
Total CHOL/HDL Ratio: 2
Triglycerides: 92 mg/dL (ref 0.0–149.0)
VLDL: 18.4 mg/dL (ref 0.0–40.0)

## 2019-08-09 LAB — CBC WITH DIFFERENTIAL/PLATELET
Basophils Absolute: 0 10*3/uL (ref 0.0–0.1)
Basophils Relative: 0.4 % (ref 0.0–3.0)
Eosinophils Absolute: 0.1 10*3/uL (ref 0.0–0.7)
Eosinophils Relative: 2.7 % (ref 0.0–5.0)
HCT: 36.9 % (ref 36.0–46.0)
Hemoglobin: 12.1 g/dL (ref 12.0–15.0)
Lymphocytes Relative: 10.4 % — ABNORMAL LOW (ref 12.0–46.0)
Lymphs Abs: 0.6 10*3/uL — ABNORMAL LOW (ref 0.7–4.0)
MCHC: 32.8 g/dL (ref 30.0–36.0)
MCV: 93.7 fl (ref 78.0–100.0)
Monocytes Absolute: 0.6 10*3/uL (ref 0.1–1.0)
Monocytes Relative: 10.6 % (ref 3.0–12.0)
Neutro Abs: 4 10*3/uL (ref 1.4–7.7)
Neutrophils Relative %: 75.9 % (ref 43.0–77.0)
Platelets: 159 10*3/uL (ref 150.0–400.0)
RBC: 3.94 Mil/uL (ref 3.87–5.11)
RDW: 14.2 % (ref 11.5–15.5)
WBC: 5.3 10*3/uL (ref 4.0–10.5)

## 2019-08-09 LAB — TSH: TSH: 3.89 u[IU]/mL (ref 0.35–4.50)

## 2019-08-09 LAB — VITAMIN B12: Vitamin B-12: 239 pg/mL (ref 211–911)

## 2019-08-09 LAB — VITAMIN D 25 HYDROXY (VIT D DEFICIENCY, FRACTURES): VITD: 40.58 ng/mL (ref 30.00–100.00)

## 2019-08-09 MED ORDER — TRAZODONE HCL 50 MG PO TABS: 25.0000 mg | ORAL_TABLET | Freq: Every evening | ORAL | 2 refills | Status: DC | PRN

## 2019-08-09 MED ORDER — LORAZEPAM 1 MG PO TABS: 1.0000 mg | ORAL_TABLET | Freq: Every day | ORAL | 1 refills | Status: DC

## 2019-08-09 NOTE — Patient Instructions (Addendum)
Can call [phone] for COVID vaccine appointment.   Try the trazodone about 30 minutes prior to bedtime. Ok to start with 1/2 tab first. I would not take the ativan at the same time.   If you take the ativan for sleep instead of the trazodone - try taking it 20 minutes before bed.

## 2019-08-09 NOTE — Progress Notes (Signed)
Megan Olsen DOB: 12-Mar-1928 Encounter date: 08/09/2019  This isa 86 y.o. female who presents to establish care. Chief Complaint  Patient presents with  . Establish Care    History of present illness:  Has arthritis in hands which hurt. Worse with cold weather. Takes tylenol arthritis; not sure if it helps. Just brushing teeth hurt this morning.   Having trouble with sinuses, left eye. Has been bothered with sinus issues all her life. Sometimes just hurts in cheek. Just lives with it. Usually ok without antibiotics. Sometimes even goes up back of head that aches. Has taken zyrtec in past, but not regularly.    Follows with Dr. Marlou Porch, cardiology: Permanent atrial fibrillation with pacemaker implantation September/2019 on Eliquis.  Macular degeneration: Getting regular injections. Seeing Patel for this. Vision has stayed about the same. Has been stable.   Hypothyroid: Previously on levothyroxine, I do not see this on her list currently.  Will confirm with patient.  Hx of CVA without residual effects:back in 2003  Anxiety/depression: thinks she is doing better with this.   B12 def: Has been getting injections for years.  Oral supplement really upset stomach.  Iron def anemia: Has had iron infusions in the past.  Has followed with hematology for this.  Iron supplement was not tolerated.  Insomnia: just doesn't sleep well. Wide eyed. Has trouble falling asleep. Feels like if she can fall asleep she can get an hour or two. Didn't try the ativan last night because she is almost out. Usually wakes to go to bathroom and then can fall back asleep. Used to take a half ativan mid day and 1 at night. Tried to get off of ativan and just one at bedtime doesn't seem to be enough. Not as active as she used to be.   Vit D def: Taking oral replacement daily. GERD: Dexilant 60 mg daily HTN: Well-controlled on losartan 25 mg, hydrochlorothiazide 12.5 mg, bisoprolol 5 mg    Past Medical History:   Diagnosis Date  . Age-related macular degeneration, wet, both eyes ([place])   . Anxiety disorder 07/06/2014  . Arthritis    "all over" (04/27/2018)  . Atrial fibrillation ([place])    catheter ablation of SVT in 2002  . Benign essential HTN 07/06/2014  . Constipation, intermittent 08/04/2007  . CVA (cerebrovascular accident) ([place]) 2003   left brain; denies residual on 04/27/2018  . Depression   . GERD (gastroesophageal reflux disease)   . Heart murmur    hx (04/27/2018)  . Hypothyroidism 09/07/2014   "off RX now" (04/27/2018)  . Iron deficiency anemia   . Iron deficiency anemia, unspecified 01/02/2013  . Osteoarthritis   . Osteoporosis 01/11/2007  . Pneumonia    "one time; years and years ago" (04/27/2018)  . Presence of permanent cardiac pacemaker 04/27/2018    Dual Chamber  . Right BBB/left ant fasc block    Past Surgical History:  Procedure Laterality Date  . APPENDECTOMY    . CATARACT EXTRACTION W/ INTRAOCULAR LENS  IMPLANT, BILATERAL Bilateral   . CHOLECYSTECTOMY OPEN    . COLONOSCOPY    . EYE SURGERY    . FRACTURE SURGERY    . HIP PINNING,CANNULATED Left 07/07/2014   Procedure: CANNULATED HIP PINNING;  Surgeon: Mauri Pole, MD;  Location: [facility];  Service: Orthopedics;  Laterality: Left;  . INSERT / REPLACE / REMOVE PACEMAKER  04/27/2018    Dual Chamber  . JOINT REPLACEMENT    . LAPAROSCOPIC OVARIAN CYSTECTOMY    . LEFT  HEART CATH AND CORONARY ANGIOGRAPHY N/A 01/31/2019   Procedure: LEFT HEART CATH AND CORONARY ANGIOGRAPHY;  Surgeon: Leonie Man, MD;  Location: [facility];  Service: Cardiovascular;  Laterality: N/A;  . NASAL SEPTUM SURGERY    . PACEMAKER IMPLANT N/A 04/27/2018   Procedure: PACEMAKER IMPLANT - Dual Chamber;  Surgeon: Deboraha Sprang, MD;  Location: [facility];  Service: Cardiovascular;  Laterality: N/A;  . SVT ABLATION  2002  . TONSILLECTOMY    . TOTAL ABDOMINAL HYSTERECTOMY    . TOTAL HIP ARTHROPLASTY Right 02/23/2017   Procedure: RIGHT  TOTAL HIP ARTHROPLASTY ANTERIOR APPROACH;  Surgeon: Paralee Cancel, MD;  Location: [facility];  Service: Orthopedics;  Laterality: Right;  . VITRECTOMY Right 2008   dr Zadie Rhine.  Vitrectomy and removal of tissue.    Allergies  Allergen Reactions  . Propoxyphene N-Acetaminophen Nausea Only  . Celecoxib Itching and Rash  . Fexofenadine Rash  . Penicillins Hives and Rash    Has patient had a PCN reaction causing immediate rash, facial/tongue/throat swelling, SOB or lightheadedness with hypotension: Yes Has patient had a PCN reaction causing severe rash involving mucus membranes or skin necrosis: No Has patient had a PCN reaction that required hospitalization: No Has patient had a PCN reaction occurring within the last 10 years: No If all of the above answers are "NO", then may proceed with Cephalosporin use.    Current Meds  Medication Sig  . acetaminophen (TYLENOL) 650 MG CR tablet Take 650 mg by mouth every 8 (eight) hours as needed for pain.   . bisoprolol (ZEBETA) 5 MG tablet Take 5 mg by mouth daily.  . Carboxymethylcellulose Sodium (THERATEARS) 0.25 % SOLN Place 1 drop into both eyes 3 (three) times daily as needed (for dry/irritated eyes.).  Marland Kitchen Cholecalciferol (VITAMIN D3) 2000 units TABS Take 2,000 Units by mouth daily.  Marland Kitchen dexlansoprazole (DEXILANT) 60 MG capsule Take 1 capsule (60 mg total) by mouth daily.  Marland Kitchen ELIQUIS 2.5 MG TABS tablet TAKE 1 TABLET TWICE DAILY  . hydrochlorothiazide (MICROZIDE) 12.5 MG capsule Take 1 capsule (12.5 mg total) by mouth daily.  Marland Kitchen ipratropium (ATROVENT) 0.03 % nasal spray Place 2 sprays into both nostrils every 12 (twelve) hours.  Marland Kitchen LORazepam (ATIVAN) 1 MG tablet Take 1 tablet (1 mg total) by mouth at bedtime.  Marland Kitchen losartan (COZAAR) 25 MG tablet Take 1 tablet (25 mg total) by mouth daily.  . Multiple Vitamins-Minerals (PRESERVISION AREDS 2) CAPS Take 1 capsule by mouth 2 (two) times daily.  . nitroGLYCERIN (NITROSTAT) 0.4 MG SL tablet Place 1 tablet (0.4 mg  total) under the tongue every 5 (five) minutes x 3 doses as needed for chest pain.  . NON FORMULARY Place 1 Dose into both eyes See admin instructions. Receives injections into both eyes every 5-8 weeks at Dr. Serita Grit office [facility])  . [DISCONTINUED] LORazepam (ATIVAN) 1 MG tablet Take 1 tablet (1 mg total) by mouth at bedtime.   Social History   Tobacco Use  . Smoking status: Never Smoker  . Smokeless tobacco: Never Used  Substance Use Topics  . Alcohol use: Yes    Comment: 04/27/2018 "a beer q 3 months or so; if that"   Family History  Problem Relation Age of Onset  . Early death Mother   . Nephritis Mother   . Kidney disease Mother   . Heart attack Father   . Heart disease Sister   . Dementia Sister   . Heart attack Brother   .  Dementia Sister      Review of Systems  Constitutional: Negative for chills, fatigue and fever.  Respiratory: Negative for cough, chest tightness, shortness of breath and wheezing.   Cardiovascular: Negative for chest pain, palpitations and leg swelling.    Objective:  BP 120/80 (BP Location: Left Arm, Patient Position: Sitting, Cuff Size: Normal)   Pulse 65   Temp (!) 97.4 F (36.3 C) (Temporal)   Ht 5\' 4"  (1.626 m)   Wt 126 lb 8 oz (57.4 kg)   SpO2 99%   BMI 21.71 kg/m   Weight: 126 lb 8 oz (57.4 kg)   BP Readings from Last 3 Encounters:  08/09/19 120/80  05/12/19 122/66  05/12/19 122/66   Wt Readings from Last 3 Encounters:  08/09/19 126 lb 8 oz (57.4 kg)  05/12/19 125 lb (56.7 kg)  05/12/19 125 lb 6.4 oz (56.9 kg)    Physical Exam Constitutional:      General: She is not in acute distress.    Appearance: She is well-developed.  Cardiovascular:     Rate and Rhythm: Normal rate and regular rhythm.     Heart sounds: Normal heart sounds. No murmur. No friction rub.  Pulmonary:     Effort: Pulmonary effort is normal. No respiratory distress.     Breath sounds: Normal breath sounds. No wheezing or rales.   Musculoskeletal:     Right lower leg: No edema.     Left lower leg: No edema.  Neurological:     Mental Status: She is alert and oriented to person, place, and time.  Psychiatric:        Behavior: Behavior normal.     Assessment/Plan:  1. Generalized anxiety disorder She has been on Ativan long-term.  We did discuss risks of this medication she has cut back from 1 and half tablets to 1 tablet.  She has not been taking this every night.  Anxiety is well controlled currently. - LORazepam (ATIVAN) 1 MG tablet; Take 1 tablet (1 mg total) by mouth at bedtime.  Dispense: 90 tablet; Refill: 1  2. Benign essential HTN Blood pressure stable.  Continue current medications. - CBC with Differential/Platelet; Future - Comprehensive metabolic panel; Future - Comprehensive metabolic panel - CBC with Differential/Platelet  3. Seasonal allergic rhinitis due to pollen We discussed using Zyrtec on a daily basis.  She continues to have some sinus congestion, more prominent on the left maxillary area.  If worsening of symptoms, I have encouraged her to let me know so we can consider antibiotic treatment.  I do think she would get some benefit from daily antihistamine to prevent congestion.  We also discussed using nasal saline spray.  4. Gastroesophageal reflux disease, unspecified whether esophagitis present Controlled on Dexilant.  5. Acquired hypothyroidism We will recheck labs for stability.  We will confirm Synthroid dosing. - TSH; Future - TSH  6. Localized osteoporosis without current pathological fracture On calcium, vitamin D.  7. Other iron deficiency anemia We will recheck her baseline.  Has had infusions in the past with hematology. - IBC + Ferritin; Future - IBC + Ferritin  8. Primary insomnia She is interested in trying something else for sleep.  We discussed trial of trazodone rather than using the Ativan.  She will let me know how this goes. - traZODone (DESYREL) 50 MG  tablet; Take 0.5-1 tablets (25-50 mg total) by mouth at bedtime as needed for sleep.  Dispense: 30 tablet; Refill: 2  9. Vitamin D deficiency Continue with  oral supplementation. - VITAMIN D 25 Hydroxy (Vit-D Deficiency, Fractures); Future - VITAMIN D 25 Hydroxy (Vit-D Deficiency, Fractures)  10. B12 deficiency Consider sublingual supplementation rather than injections for convenience. - Vitamin B12; Future - Vitamin B12  11. Hyperlipidemia, unspecified hyperlipidemia type Cholesterol has been well controlled. - Comprehensive metabolic panel; Future - Lipid panel; Future - Lipid panel - Comprehensive metabolic panel  Return for pending labs.  Micheline Rough, MD

## 2019-08-10 ENCOUNTER — Telehealth: Payer: Self-pay | Admitting: *Deleted

## 2019-08-10 ENCOUNTER — Other Ambulatory Visit: Payer: Self-pay | Admitting: *Deleted

## 2019-08-10 ENCOUNTER — Encounter: Payer: Self-pay | Admitting: *Deleted

## 2019-08-10 DIAGNOSIS — D508 Other iron deficiency anemias: Secondary | ICD-10-CM

## 2019-08-10 NOTE — Telephone Encounter (Signed)
pls call pt back held for a bit as she wanted to speak with you but she is anxious to speak with you

## 2019-08-10 NOTE — Telephone Encounter (Signed)
-----   Message from Caren Macadam, MD sent at 08/10/2019  9:57 AM EST ----- I do not see Synthroid on her active med list?  She had been on it for years.  Can you check with her if she still taking this?  Thyroid looked good on blood work so she is on it please just confirm dose and add it back to med list.

## 2019-08-10 NOTE — Telephone Encounter (Signed)
See results and phone note.

## 2019-08-10 NOTE — Telephone Encounter (Signed)
I spoke with the pt and she stated she has not used Synthroid since 2014.  Stated she was taking this and after being in rehab, when she came home, Synthroid was not on her list and after seeing Dr Burnice Logan he told her she did not need to take this and also Dr Cleda Mccreedy advised her not to take this as he told her she did not need this.  Patient also stated she was taking Losartan at night, thinking this was Lorazepam and questioned if this why she cannot sleep?  Also wanted to know if she needs to take Losartan and if so, what is she taking this for?  Message sent to PCP.

## 2019-08-10 NOTE — Telephone Encounter (Signed)
Left a message for the pt to return my call.  

## 2019-08-11 ENCOUNTER — Other Ambulatory Visit: Payer: Self-pay | Admitting: Family Medicine

## 2019-08-11 ENCOUNTER — Telehealth: Payer: Self-pay | Admitting: Hematology and Oncology

## 2019-08-11 NOTE — Telephone Encounter (Signed)
Scheduled per 1/21 sch msg. Called and spoke with pt, confirmed 4/20 and 4/22 appt

## 2019-08-11 NOTE — Telephone Encounter (Signed)
No answer at the pts home number-will attempt to call back later.

## 2019-08-11 NOTE — Telephone Encounter (Signed)
*  losartan is for blood pressure and her pressures have been quite good, so I would suggest she continue taking this. It won't help with sleep so definitely might be reason she is having harder time!  *I will make note that she is not on the synthroid; thanks for clarification. Thyroid has been in normal range off of meds which is great.   *She may find that even a half tablet of the lorazepam or trazadone (not both) will be quite helpful with sleep, especially if she hasn't been taking anything. We had discussed trying the trazodone due to it not being a higher risk medication for her. Let me know if she doesn't have improvement in sleep.

## 2019-08-14 ENCOUNTER — Other Ambulatory Visit: Payer: Self-pay | Admitting: Family Medicine

## 2019-08-14 MED ORDER — LOSARTAN POTASSIUM 25 MG PO TABS: 25.0000 mg | ORAL_TABLET | Freq: Every day | ORAL | 1 refills | Status: DC

## 2019-08-14 NOTE — Telephone Encounter (Signed)
Spoke with the pt and informed her of the message below.  Patient requested a refill for Losartan be sent to [facility].  Message sent to PCP as last Rx was given by former PCP.

## 2019-08-14 NOTE — Telephone Encounter (Signed)
done

## 2019-08-16 ENCOUNTER — Other Ambulatory Visit: Payer: Self-pay | Admitting: Internal Medicine

## 2019-08-31 ENCOUNTER — Other Ambulatory Visit: Payer: Self-pay | Admitting: Family Medicine

## 2019-08-31 DIAGNOSIS — F5101 Primary insomnia: Secondary | ICD-10-CM

## 2019-09-07 ENCOUNTER — Other Ambulatory Visit: Payer: Self-pay | Admitting: Cardiology

## 2019-09-07 NOTE — Telephone Encounter (Signed)
Eliquis 2.5mg  refill request received, pt is 86 yrs old, weight-57.4kg, Crea-0.74 on 08/09/2019, Diagnosis-Afib, and last seen by Dr.  Marlou Porch on 05/15/2019. Dose is appropriate based on dosing criteria. Will send in refill to requested pharmacy.

## 2019-09-11 DIAGNOSIS — H353211 Exudative age-related macular degeneration, right eye, with active choroidal neovascularization: Secondary | ICD-10-CM | POA: Diagnosis not present

## 2019-09-12 ENCOUNTER — Encounter: Payer: Self-pay | Admitting: Family Medicine

## 2019-09-12 ENCOUNTER — Telehealth (INDEPENDENT_AMBULATORY_CARE_PROVIDER_SITE_OTHER): Payer: Medicare HMO | Admitting: Family Medicine

## 2019-09-12 ENCOUNTER — Ambulatory Visit: Payer: Medicare HMO

## 2019-09-12 ENCOUNTER — Other Ambulatory Visit: Payer: Self-pay

## 2019-09-12 VITALS — Wt 127.0 lb

## 2019-09-12 DIAGNOSIS — Z0000 Encounter for general adult medical examination without abnormal findings: Secondary | ICD-10-CM | POA: Diagnosis not present

## 2019-09-12 NOTE — Progress Notes (Signed)
Medicare Annual Preventive Care Visit  (initial annual wellness or annual wellness exam)  Virtual Visit via Video Note  I connected with Megan Olsen by a video enabled telemedicine application and verified that I am speaking with the correct person using two identifiers. She was not able to do a video connection so visit was complete in audio only. approx 35 minutes was spent on this visit.  Location patient: home Location provider:work or home office Persons participating in the virtual visit: patient, provider  Concerns and/or follow up today:  Megan Olsen is a pleasant 86 yo with a PMH A. Fib, HTN, CAD, Anemia, B 12 def, Hypothyroidism, anxiety and Depression seen for her AWV. She saw her PCP, Dr. Ethlyn Gallery, recently and had extensive lab work. Due for possibly the new shingles vaccine, ? DEXA - appears was seeing Dr. Cruzita Lederer in the past for this and was taking Vit D and estradiol. Currently takes Vit D. She reports " I don't want to take anything else on right now" and graciously declines a bone density test after a discussion.  She lives alone but has close friend nearby. She has her friends drive her for the most part. She does some leg exercises in bed each morning and gets out and walks when the weather is good. She uses caution with ambulation. Does not feel like at risk of falls. Has some reported difficulty with hearing. Reports is doing well except for some arthritis. Reports she uses a cream for this and it is doing better now that the weather is better.  She is considering the COVID19 vaccine, but she does not feel she could wait in line that long. She is planning to get once it is more available. She is also considering the shingles vaccine.  See HM section in Epic for other details of completed HM. See scanned documentation under Media Tab for further documentation HPI, health risk assessment. See Media Tab and Care Teams sections in Epic for other providers.  ROS: negative for report of  fevers, unintentional weight loss, vision changes, vision loss, hearing loss or change, chest pain, sob, hemoptysis, melena, hematochezia, hematuria, genital discharge or lesions, falls, bleeding or bruising, loc, thoughts of suicide or self harm, memory loss  1.) Patient-completed health risk assessment  - completed and reviewed, see scanned documentation  2.) Review of Medical History: -PMH, PSH, Family History and current specialty and care providers reviewed and updated and listed below  - see scanned in document in chart and below Patient Care Team: Caren Macadam, MD as PCP - General (Family Medicine) Jerline Pain, MD as PCP - Cardiology (Cardiology) Deboraha Sprang, MD as PCP - Electrophysiology (Cardiology) Dr. Posey Pronto - Opthomology  Past Medical History:  Diagnosis Date  . Age-related macular degeneration, wet, both eyes ([place])   . Anxiety disorder 07/06/2014  . Arthritis    "all over" (04/27/2018)  . Atrial fibrillation ([place])    catheter ablation of SVT in 2002  . Benign essential HTN 07/06/2014  . Constipation, intermittent 08/04/2007  . CVA (cerebrovascular accident) ([place]) 2003   left brain; denies residual on 04/27/2018  . Depression   . GERD (gastroesophageal reflux disease)   . Heart murmur    hx (04/27/2018)  . Hypothyroidism 09/07/2014   "off RX now" (04/27/2018)  . Iron deficiency anemia   . Iron deficiency anemia, unspecified 01/02/2013  . Osteoarthritis   . Osteoporosis 01/11/2007  . Pneumonia    "one time; years and years ago" (04/27/2018)  . Presence  of permanent cardiac pacemaker 04/27/2018    Dual Chamber  . Right BBB/left ant fasc block     Past Surgical History:  Procedure Laterality Date  . APPENDECTOMY    . CATARACT EXTRACTION W/ INTRAOCULAR LENS  IMPLANT, BILATERAL Bilateral   . CHOLECYSTECTOMY OPEN    . COLONOSCOPY    . EYE SURGERY    . FRACTURE SURGERY    . HIP PINNING,CANNULATED Left 07/07/2014   Procedure: CANNULATED HIP PINNING;   Surgeon: Mauri Pole, MD;  Location: [facility];  Service: Orthopedics;  Laterality: Left;  . INSERT / REPLACE / REMOVE PACEMAKER  04/27/2018    Dual Chamber  . JOINT REPLACEMENT    . LAPAROSCOPIC OVARIAN CYSTECTOMY    . LEFT HEART CATH AND CORONARY ANGIOGRAPHY N/A 01/31/2019   Procedure: LEFT HEART CATH AND CORONARY ANGIOGRAPHY;  Surgeon: Leonie Man, MD;  Location: [place];  Service: Cardiovascular;  Laterality: N/A;  . NASAL SEPTUM SURGERY    . PACEMAKER IMPLANT N/A 04/27/2018   Procedure: PACEMAKER IMPLANT - Dual Chamber;  Surgeon: Deboraha Sprang, MD;  Location: [place] [facility];  Service: Cardiovascular;  Laterality: N/A;  . SVT ABLATION  2002  . TONSILLECTOMY    . TOTAL ABDOMINAL HYSTERECTOMY    . TOTAL HIP ARTHROPLASTY Right 02/23/2017   Procedure: RIGHT TOTAL HIP ARTHROPLASTY ANTERIOR APPROACH;  Surgeon: Paralee Cancel, MD;  Location: [facility];  Service: Orthopedics;  Laterality: Right;  . VITRECTOMY Right 2008   dr Zadie Rhine.  Vitrectomy and removal of tissue.     Social History   Socioeconomic History  . Marital status: Single    Spouse name: Not on file  . Number of children: Not on file  . Years of education: Not on file  . Highest education level: Not on file  Occupational History  . Not on file  Tobacco Use  . Smoking status: Never Smoker  . Smokeless tobacco: Never Used  Substance and Sexual Activity  . Alcohol use: Yes    Comment: 04/27/2018 "a beer q 3 months or so; if that"  . Drug use: Never  . Sexual activity: Not Currently  Other Topics Concern  . Not on file  Social History Narrative  . Not on file   Social Determinants of Health   Financial Resource Strain:   . Difficulty of Paying Living Expenses: Not on file  Food Insecurity:   . Worried About Charity fundraiser in the Last Year: Not on file  . Ran Out of Food in the Last Year: Not on file  Transportation Needs:   . Lack of Transportation (Medical): Not on file  . Lack of  Transportation (Non-Medical): Not on file  Physical Activity:   . Days of Exercise per Week: Not on file  . Minutes of Exercise per Session: Not on file  Stress:   . Feeling of Stress : Not on file  Social Connections:   . Frequency of Communication with Friends and Family: Not on file  . Frequency of Social Gatherings with Friends and Family: Not on file  . Attends Religious Services: Not on file  . Active Member of Clubs or Organizations: Not on file  . Attends Archivist Meetings: Not on file  . Marital Status: Not on file  Intimate Partner Violence:   . Fear of Current or Ex-Partner: Not on file  . Emotionally Abused: Not on file  . Physically Abused: Not on file  . Sexually Abused: Not  on file    Family History  Problem Relation Age of Onset  . Early death Mother   . Nephritis Mother   . Kidney disease Mother   . Heart attack Father   . Heart disease Sister   . Dementia Sister   . Heart attack Brother   . Dementia Sister     Current Outpatient Medications on File Prior to Visit  Medication Sig Dispense Refill  . acetaminophen (TYLENOL) 650 MG CR tablet Take 650 mg by mouth every 8 (eight) hours as needed for pain.     . bisoprolol (ZEBETA) 5 MG tablet TAKE 1 TABLET EVERY DAY 90 tablet 1  . Carboxymethylcellulose Sodium (THERATEARS) 0.25 % SOLN Place 1 drop into both eyes 3 (three) times daily as needed (for dry/irritated eyes.).    Marland Kitchen Cholecalciferol (VITAMIN D3) 2000 units TABS Take 2,000 Units by mouth daily.    Marland Kitchen dexlansoprazole (DEXILANT) 60 MG capsule Take 1 capsule (60 mg total) by mouth daily. 90 capsule 3  . ELIQUIS 2.5 MG TABS tablet TAKE 1 TABLET TWICE DAILY 180 tablet 2  . hydrochlorothiazide (MICROZIDE) 12.5 MG capsule Take 1 capsule (12.5 mg total) by mouth daily. 90 capsule 2  . ipratropium (ATROVENT) 0.03 % nasal spray Place 2 sprays into both nostrils every 12 (twelve) hours. 30 mL 12  . linaclotide (LINZESS) 290 MCG CAPS capsule Take 1 capsule  (290 mcg total) by mouth daily before breakfast. 30 capsule 0  . LORazepam (ATIVAN) 1 MG tablet Take 1 tablet (1 mg total) by mouth at bedtime. 90 tablet 1  . losartan (COZAAR) 25 MG tablet Take 1 tablet (25 mg total) by mouth daily. 90 tablet 1  . Multiple Vitamins-Minerals (PRESERVISION AREDS 2) CAPS Take 1 capsule by mouth 2 (two) times daily. 90 capsule 3  . nitroGLYCERIN (NITROSTAT) 0.4 MG SL tablet Place 1 tablet (0.4 mg total) under the tongue every 5 (five) minutes x 3 doses as needed for chest pain. 30 tablet 0  . NON FORMULARY Place 1 Dose into both eyes See admin instructions. Receives injections into both eyes every 5-8 weeks at Dr. Serita Grit office [facility])    . traZODone (DESYREL) 50 MG tablet TAKE 1/2 TO 1 TABLET BY MOUTH AT BEDTIME AS NEEDED FOR SLEEP 90 tablet 1   No current facility-administered medications on file prior to visit.     3.) Review of functional ability and level of safety:  Any difficulty hearing?  See scanned documentation  History of falling?  See scanned documentation  Any trouble with IADLs - using a phone, using transportation, grocery shopping, preparing meals, doing housework, doing laundry, taking medications and managing money?  See scanned documentation  Advance Directives?  See scanned documentation, she reports she has a living will/HCPOA. She agrees to bring a copy of this to the office at her next visit.  See summary of recommendations in Patient Instructions below.  4.) Physical Exam There were no vitals filed for this visit. Estimated body mass index is 21.8 kg/m as calculated from the following:   Height as of 08/09/19: 5\' 4"  (1.626 m).   Weight as of this encounter: 127 lb (57.6 kg).  EKG (optional): deferred  VITALS per patient if applicable:  GENERAL: alert, oriented, appears well and in no acute distress; visual acuity grossly intact, full vision exam deferred due to pandemic and/or virtual encounter  HEENT:  atraumatic, conjunttiva clear, no obvious abnormalities on inspection of external nose and ears  NECK: normal movements  of the head and neck  LUNGS: on inspection no signs of respiratory distress, breathing rate appears normal, no obvious gross SOB, gasping or wheezing  CV: no obvious cyanosis  MS: moves all visible extremities without noticeable abnormality  PSYCH/NEURO: pleasant and cooperative, no obvious depression or anxiety, speech and thought processing grossly intact, Cognitive function grossly intact  Depression screen [facility] 2/9 09/12/2019 08/22/2018 05/25/2018 01/26/2018 05/06/2017  Decreased Interest 0 1 0 0 1  Down, Depressed, Hopeless 0 0 0 0 0  PHQ - 2 Score 0 1 0 0 1  Altered sleeping - 1 1 0 -  Tired, decreased energy - 1 1 0 -  Change in appetite - 0 0 0 -  Feeling bad or failure about yourself  - 0 0 0 -  Trouble concentrating - 0 1 0 -  Moving slowly or fidgety/restless - 0 0 0 -  Suicidal thoughts - 0 0 0 -  PHQ-9 Score - 3 3 0 -  Difficult doing work/chores - Not difficult at all - Not difficult at all -  Some recent data might be hidden     See patient instructions for recommendations.  Education and counseling regarding the above review of health provided with a plan for the following: -see scanned patient completed form for further details -fall prevention strategies discussed  -healthy lifestyle discussed -importance and resources for completing advanced directives discussed -see patient instructions below for any other recommendations provided  4)The following written screening schedule of preventive measures were reviewed with assessment and plan made per below, orders and patient instructions:          Alcohol screening done     Obesity Screening and counseling done     STI screening (Hep C if born [birth year]) offered and per pt wishes     Tobacco Screening done done       Pneumococcal (PPSV23 -one dose after 64, one before if risk factors), influenza  yearly and hepatitis B vaccines (if high risk - end stage renal disease, IV drugs, homosexual men, live in home for mentally retarded, hemophilia receiving factors) ASSESSMENT/PLAN: done if applicable      Screening mammograph (yearly if >40) ASSESSMENT/PLAN: utd or ordered      Screening Pap smear/pelvic exam (q2 years) ASSESSMENT/PLAN: n/a, declined      Colorectal cancer screening (FOBT yearly or flex sig q4y or colonoscopy q10y or barium enema q4y) ASSESSMENT/PLAN: utd or ordered      Diabetes outpatient self-management training services ASSESSMENT/PLAN: utd or done      Bone mass measurements(covered q2y if indicated - estrogen def, osteoporosis, hyperparathyroid, vertebral abnormalities, osteoporosis or steroids) ASSESSMENT/PLAN: pt declined at this time      Screening for glaucoma(q1y if high risk - diabetes, FH, AA and > 50 or hispanic and > 65) ASSESSMENT/PLAN: utd or advised - reports saw her eye doctor recently      Medical nutritional therapy for individuals with diabetes or renal disease ASSESSMENT/PLAN: n/a      Cardiovascular screening blood tests (lipids q5y) ASSESSMENT/PLAN: see labs, done recenlty      Diabetes screening tests ASSESSMENT/PLAN: see  Labs, done recently   7.) Summary:   Medicare annual wellness visit, subsequent -risk factors and conditions per above assessment were discussed and treatment, recommendations and referrals were offered per documentation above and orders and patient instructions. -discussed shinrix and COVID19 vaccines and answered quesitons -discussed DEXA, but she declined for now -continue healthy diet and regular exercises before getting out  of bed -advised of fall precautions at length  There are no Patient Instructions on file for this visit.  Megan Kern, DO

## 2019-09-12 NOTE — Patient Instructions (Signed)
  Ms. Megan Olsen , Thank you for taking time to come for your Medicare Wellness Visit. I appreciate your ongoing commitment to your health goals. Please review the following plan we discussed and let me know if I can assist you in the future.   These are the goals we discussed: Goals   -continue to eat healthy meals and stay as active as possible - chair exercises, youtube videos for chair exercises and walking with walker or cane. -use caution with walking and getting up and down to avoid falls, use cane/walker -get your COVID19 and shinrix vaccines - these will have to be spaced out at [place] 2 weeks   This is a list of the screening recommended for you and due dates:  Health Maintenance  Topic Date Due  . Tetanus Vaccine  05/02/2022  . Flu Shot  Completed  . DEXA scan (bone density measurement)  Completed  . Pneumonia vaccines  Completed

## 2019-09-20 DIAGNOSIS — H353221 Exudative age-related macular degeneration, left eye, with active choroidal neovascularization: Secondary | ICD-10-CM | POA: Diagnosis not present

## 2019-09-26 ENCOUNTER — Other Ambulatory Visit: Payer: Self-pay

## 2019-09-26 ENCOUNTER — Telehealth: Payer: Self-pay | Admitting: Family Medicine

## 2019-09-26 DIAGNOSIS — F411 Generalized anxiety disorder: Secondary | ICD-10-CM

## 2019-09-26 MED ORDER — LORAZEPAM 1 MG PO TABS: 1.0000 mg | ORAL_TABLET | Freq: Every day | ORAL | 1 refills | Status: DC

## 2019-09-26 NOTE — Telephone Encounter (Signed)
Rx printed by accident. Tried pending Rx.

## 2019-09-26 NOTE — Telephone Encounter (Signed)
Patient returned call to office wanting a call back.

## 2019-09-26 NOTE — Telephone Encounter (Signed)
Pt wanted the reaming refills for Ativan sent to [facility]. Rx printed to be faxed.

## 2019-09-26 NOTE — Telephone Encounter (Signed)
Received a fax for pt medication refill from [facility]. Per chart Rx was refilled and sent to [facility]. Called pt to see if she received all of medication and if this was suppose to go to [facility]. Pt did not answer will try again later.

## 2019-09-26 NOTE — Telephone Encounter (Signed)
Disregard

## 2019-09-27 ENCOUNTER — Other Ambulatory Visit: Payer: Self-pay | Admitting: Family Medicine

## 2019-09-27 DIAGNOSIS — F411 Generalized anxiety disorder: Secondary | ICD-10-CM

## 2019-09-27 MED ORDER — LORAZEPAM 1 MG PO TABS: 1.0000 mg | ORAL_TABLET | Freq: Every evening | ORAL | 1 refills | Status: DC | PRN

## 2019-09-27 NOTE — Telephone Encounter (Signed)
Noted! Pt notified of update.  

## 2019-09-27 NOTE — Telephone Encounter (Signed)
Rx was sent for 90 day to [place] today

## 2019-10-18 ENCOUNTER — Other Ambulatory Visit: Payer: Self-pay | Admitting: Nurse Practitioner

## 2019-10-23 DIAGNOSIS — H353211 Exudative age-related macular degeneration, right eye, with active choroidal neovascularization: Secondary | ICD-10-CM | POA: Diagnosis not present

## 2019-10-30 ENCOUNTER — Telehealth: Payer: Self-pay | Admitting: Family Medicine

## 2019-10-30 NOTE — Telephone Encounter (Signed)
Megan Olsen with [facility], is requesting a refill on Trazodone 50 mg tablet. Please send to [facility]. Thanks

## 2019-10-31 NOTE — Progress Notes (Signed)
CARDIOLOGY OFFICE NOTE  Date:  11/06/2019    [name] Date of Birth: 28-Aug-1927 Medical Record [id number]  PCP:  Caren Macadam, MD  Cardiologist:  Servando Snare Skains/Klein   Chief Complaint  Patient presents with  . Follow-up    Follow up - seen for Dr. Hampton Abbot    History of Present Illness: Megan Olsen is a 86 y.o. female who presents today for a 6 month check. Seen for Dr. Marlou Porch.   She has a history of underlying VVIpacemaker implanted 9/19 for tachybradycardia in the setting of permanent atrial fibrillation. Thromboembolic risk factors ( age -71, HTN-1, TIA/CVA-2, DM-1, Gender-1) for a CHADSVASc Score of>= 7. On Eliquis (after prior declining). Other issues include HTN, HLD, valvular heart disease with MR and prior stroke.   She was admitted July of 2020 with chest pain/NSTEMI - Takotsubo - coronaries are normal - follow up echo was ok - EF normal. She has required reprogramming due to continued bouts of presyncope.   Had a telehealth visit back in October with Dr. Marlou Porch and was felt to be doing ok - has developed macular degeneration.   The patient does not have symptoms concerning for COVID-19 infection (fever, chills, cough, or new shortness of breath).   Comes in today. Here with her friend - [name]. She feels like she has been doing ok. No chest pain. Breathing is stable. She is getting more limited now due to her vision - still getting injections on a regular basis. BP is good. Tolerating her medicines without issue. Overall, no real concerns.   Past Medical History:  Diagnosis Date  . Age-related macular degeneration, wet, both eyes ([place])   . Anxiety disorder 07/06/2014  . Arthritis    "all over" (04/27/2018)  . Atrial fibrillation ([place])    catheter ablation of SVT in 2002  . Benign essential HTN 07/06/2014  . Constipation, intermittent 08/04/2007  . CVA (cerebrovascular accident) ([place]) 2003   left brain; denies residual on 04/27/2018   . Depression   . GERD (gastroesophageal reflux disease)   . Heart murmur    hx (04/27/2018)  . Hypothyroidism 09/07/2014   "off RX now" (04/27/2018)  . Iron deficiency anemia   . Iron deficiency anemia, unspecified 01/02/2013  . Osteoarthritis   . Osteoporosis 01/11/2007  . Pneumonia    "one time; years and years ago" (04/27/2018)  . Presence of permanent cardiac pacemaker 04/27/2018    Dual Chamber  . Right BBB/left ant fasc block     Past Surgical History:  Procedure Laterality Date  . APPENDECTOMY    . CATARACT EXTRACTION W/ INTRAOCULAR LENS  IMPLANT, BILATERAL Bilateral   . CHOLECYSTECTOMY OPEN    . COLONOSCOPY    . EYE SURGERY    . FRACTURE SURGERY    . HIP PINNING,CANNULATED Left 07/07/2014   Procedure: CANNULATED HIP PINNING;  Surgeon: Mauri Pole, MD;  Location: [facility];  Service: Orthopedics;  Laterality: Left;  . INSERT / REPLACE / REMOVE PACEMAKER  04/27/2018    Dual Chamber  . JOINT REPLACEMENT    . LAPAROSCOPIC OVARIAN CYSTECTOMY    . LEFT HEART CATH AND CORONARY ANGIOGRAPHY N/A 01/31/2019   Procedure: LEFT HEART CATH AND CORONARY ANGIOGRAPHY;  Surgeon: Leonie Man, MD;  Location: [facility];  Service: Cardiovascular;  Laterality: N/A;  . NASAL SEPTUM SURGERY    . PACEMAKER IMPLANT N/A 04/27/2018   Procedure: PACEMAKER IMPLANT - Dual Chamber;  Surgeon: Deboraha Sprang,  MD;  Location: [facility];  Service: Cardiovascular;  Laterality: N/A;  . SVT ABLATION  2002  . TONSILLECTOMY    . TOTAL ABDOMINAL HYSTERECTOMY    . TOTAL HIP ARTHROPLASTY Right 02/23/2017   Procedure: RIGHT TOTAL HIP ARTHROPLASTY ANTERIOR APPROACH;  Surgeon: Paralee Cancel, MD;  Location: [facility];  Service: Orthopedics;  Laterality: Right;  . VITRECTOMY Right 2008   dr Zadie Rhine.  Vitrectomy and removal of tissue.      Medications: Current Meds  Medication Sig  . acetaminophen (TYLENOL) 650 MG CR tablet Take 650 mg by mouth every 8 (eight) hours as needed for pain.   .  bisoprolol (ZEBETA) 5 MG tablet TAKE 1 TABLET EVERY DAY  . Carboxymethylcellulose Sodium (THERATEARS) 0.25 % SOLN Place 1 drop into both eyes 3 (three) times daily as needed (for dry/irritated eyes.).  Marland Kitchen Cholecalciferol (VITAMIN D3) 2000 units TABS Take 2,000 Units by mouth daily.  Marland Kitchen dexlansoprazole (DEXILANT) 60 MG capsule Take 1 capsule (60 mg total) by mouth daily.  Marland Kitchen ELIQUIS 2.5 MG TABS tablet TAKE 1 TABLET TWICE DAILY  . hydrochlorothiazide (MICROZIDE) 12.5 MG capsule TAKE 1 CAPSULE EVERY DAY  . ipratropium (ATROVENT) 0.03 % nasal spray Place 2 sprays into both nostrils every 12 (twelve) hours.  Marland Kitchen LORazepam (ATIVAN) 1 MG tablet Take 1 tablet (1 mg total) by mouth at bedtime as needed for anxiety or sleep.  Marland Kitchen losartan (COZAAR) 25 MG tablet Take 1 tablet (25 mg total) by mouth daily.  . Multiple Vitamins-Minerals (PRESERVISION AREDS 2) CAPS Take 1 capsule by mouth 2 (two) times daily.  . nitroGLYCERIN (NITROSTAT) 0.4 MG SL tablet Place 1 tablet (0.4 mg total) under the tongue every 5 (five) minutes x 3 doses as needed for chest pain.  . NON FORMULARY Place 1 Dose into both eyes See admin instructions. Receives injections into both eyes every 5-8 weeks at Dr. Serita Grit office [facility])  . traZODone (DESYREL) 50 MG tablet Take 0.5-1 tablets (25-50 mg total) by mouth at bedtime as needed. for sleep     Allergies: Allergies  Allergen Reactions  . Propoxyphene N-Acetaminophen Nausea Only  . Celecoxib Itching and Rash  . Fexofenadine Rash  . Penicillins Hives and Rash    Has patient had a PCN reaction causing immediate rash, facial/tongue/throat swelling, SOB or lightheadedness with hypotension: Yes Has patient had a PCN reaction causing severe rash involving mucus membranes or skin necrosis: No Has patient had a PCN reaction that required hospitalization: No Has patient had a PCN reaction occurring within the last 10 years: No If all of the above answers are "NO", then may  proceed with Cephalosporin use.     Social History: The patient  reports that she has never smoked. She has never used smokeless tobacco. She reports current alcohol use. She reports that she does not use drugs.   Family History: The patient's family history includes Dementia in her sister and sister; Early death in her mother; Heart attack in her brother and father; Heart disease in her sister; Kidney disease in her mother; Nephritis in her mother.   Review of Systems: Please see the history of present illness.   All other systems are reviewed and negative.   Physical Exam: VS:  BP 114/72   Pulse 65   Ht 5\' 4"  (1.626 m)   Wt 123 lb 8 oz (56 kg)   SpO2 96%   BMI 21.20 kg/m  .  BMI Body mass index is 21.2 kg/m.  Wt Readings from Last 3 Encounters:  11/06/19 123 lb 8 oz (56 kg)  09/12/19 127 lb (57.6 kg)  08/09/19 126 lb 8 oz (57.4 kg)    General: Pleasant. Alert and in no acute distress.  She looks younger than her stated age.   Cardiac: Regular rate and rhythm. No murmurs, rubs, or gallops. No edema.  Respiratory:  Lungs are clear to auscultation bilaterally with normal work of breathing.  GI: Soft and nontender.  MS: No deformity or atrophy. Gait and ROM intact.  Skin: Warm and dry. Color is normal.  Neuro:  Strength and sensation are intact and no gross focal deficits noted.  Psych: Alert, appropriate and with normal affect.   LABORATORY DATA:  EKG:  EKG is not ordered today.    Lab Results  Component Value Date   WBC 5.3 08/09/2019   HGB 12.1 08/09/2019   HCT 36.9 08/09/2019   PLT 159.0 08/09/2019   GLUCOSE 82 08/09/2019   CHOL 146 08/09/2019   TRIG 92.0 08/09/2019   HDL 65.60 08/09/2019   LDLCALC 62 08/09/2019   ALT 11 08/09/2019   AST 17 08/09/2019   NA 139 08/09/2019   K 4.2 08/09/2019   CL 103 08/09/2019   CREATININE 0.74 08/09/2019   BUN 16 08/09/2019   CO2 29 08/09/2019   TSH 3.89 08/09/2019   INR 0.92 01/02/2013     BNP (last 3  results) Recent Labs    01/30/19 1545  BNP 168.5*    ProBNP (last 3 results) No results for input(s): PROBNP in the last 8760 hours.   Other Studies Reviewed Today:  ECHO IMPRESSIONS 01/2019  1. Left atrial size was severely dilated.  2. Right atrial size was severely dilated.  3. Trivial pericardial effusion is present.  4. Mild mitral valve prolapse.  5. The mitral valve is myxomatous. Mitral valve regurgitation is moderate  by color flow Doppler.  6. Tricuspid valve regurgitation is moderate-severe.  7. The aortic root and ascending aorta are normal in size and structure.  8. The left ventricle has normal systolic function, with an ejection  fraction of 55-60%.    ASSESSMENT & PLAN:    1. History of NSTEMI from 01/2019 - no CAD at time of cath - had Tokotsubo presentation. Doing well clinically. EF now normal. Remains on ARB and beta blocker therapy.   2. Macular degeneration - this is her primary issue.   3. HTN - BP is fine on current regimen. Continue on ARB and beta blocker therapy.   4. Persistent AF - with underlying PPM - on anticoagulation. No problems noted. Labs from January noted.   5. Chronic anticoagulation - no problems noted with her Eliquis - would continue.   6. Underlying PPM - doing well - followed by EP.   7. Prior CVA  8. Valvular heart disease - doing well clinically - moderate MR by her last study.   9. COVID-19 Education: The signs and symptoms of COVID-19 were discussed with the patient and how to seek care for testing (follow up with PCP or arrange E-visit).  The importance of social distancing, staying at home, hand hygiene and wearing a mask when out in public were discussed today.  Current medicines are reviewed with the patient today.  The patient does not have concerns regarding medicines other than what has been noted above.  The following changes have been made:  See above.  Labs/ tests ordered today include:   No orders  of the  defined types were placed in this encounter.    Disposition:   FU with Dr. Marlou Porch in 6 months. Has EP follow up later in the fall.     Patient is agreeable to this plan and will call if any problems develop in the interim.   SignedTruitt Merle, NP  11/06/2019 11:22 AM  [facility] [address] [place], [place]  [address] Phone: [phone] Fax: [phone]

## 2019-11-01 ENCOUNTER — Ambulatory Visit (INDEPENDENT_AMBULATORY_CARE_PROVIDER_SITE_OTHER): Payer: Medicare HMO | Admitting: *Deleted

## 2019-11-01 ENCOUNTER — Other Ambulatory Visit: Payer: Self-pay | Admitting: Family Medicine

## 2019-11-01 DIAGNOSIS — F5101 Primary insomnia: Secondary | ICD-10-CM

## 2019-11-01 DIAGNOSIS — I495 Sick sinus syndrome: Secondary | ICD-10-CM | POA: Diagnosis not present

## 2019-11-01 DIAGNOSIS — H353221 Exudative age-related macular degeneration, left eye, with active choroidal neovascularization: Secondary | ICD-10-CM | POA: Diagnosis not present

## 2019-11-01 LAB — CUP PACEART REMOTE DEVICE CHECK
Battery Remaining Longevity: 124 mo
Battery Remaining Percentage: 95.5 %
Battery Voltage: 3.01 V
Brady Statistic RV Percent Paced: 98 %
Date Time Interrogation Session: 20210414020033
Implantable Lead Implant Date: 20191009
Implantable Lead Location: 753860
Implantable Lead Model: 1948
Implantable Pulse Generator Implant Date: 20191009
Lead Channel Impedance Value: 600 Ohm
Lead Channel Pacing Threshold Amplitude: 0.75 V
Lead Channel Pacing Threshold Pulse Width: 0.5 ms
Lead Channel Sensing Intrinsic Amplitude: 11.8 mV
Lead Channel Setting Pacing Amplitude: 2.5 V
Lead Channel Setting Pacing Pulse Width: 0.5 ms
Lead Channel Setting Sensing Sensitivity: 2 mV
Pulse Gen Model: 1272
Pulse Gen Serial Number: 9067558

## 2019-11-01 MED ORDER — TRAZODONE HCL 50 MG PO TABS: 25.0000 mg | ORAL_TABLET | Freq: Every evening | ORAL | 1 refills | Status: DC | PRN

## 2019-11-01 NOTE — Telephone Encounter (Signed)
Rx completed

## 2019-11-01 NOTE — Progress Notes (Signed)
PPM Remote  

## 2019-11-02 ENCOUNTER — Telehealth: Payer: Self-pay | Admitting: Nurse Practitioner

## 2019-11-02 NOTE — Telephone Encounter (Signed)
I spoke with patient and Megan Olsen. Patient requires assistance due to poor vision.  Megan Olsen will accompany patient to upcoming office visit.

## 2019-11-02 NOTE — Telephone Encounter (Signed)
New Message    [name] is calling to let us know she will need to assist the patient to her appt because she has poor vision. [name] says she has assisted before     Please advise

## 2019-11-06 ENCOUNTER — Ambulatory Visit: Payer: Medicare HMO | Admitting: Nurse Practitioner

## 2019-11-06 ENCOUNTER — Other Ambulatory Visit (INDEPENDENT_AMBULATORY_CARE_PROVIDER_SITE_OTHER): Payer: Medicare HMO

## 2019-11-06 ENCOUNTER — Other Ambulatory Visit: Payer: Self-pay

## 2019-11-06 ENCOUNTER — Encounter: Payer: Self-pay | Admitting: Nurse Practitioner

## 2019-11-06 VITALS — BP 114/72 | HR 65 | Ht 64.0 in | Wt 123.5 lb

## 2019-11-06 DIAGNOSIS — Z950 Presence of cardiac pacemaker: Secondary | ICD-10-CM | POA: Diagnosis not present

## 2019-11-06 DIAGNOSIS — I5181 Takotsubo syndrome: Secondary | ICD-10-CM

## 2019-11-06 DIAGNOSIS — Z7901 Long term (current) use of anticoagulants: Secondary | ICD-10-CM

## 2019-11-06 DIAGNOSIS — D508 Other iron deficiency anemias: Secondary | ICD-10-CM | POA: Diagnosis not present

## 2019-11-06 DIAGNOSIS — I10 Essential (primary) hypertension: Secondary | ICD-10-CM | POA: Diagnosis not present

## 2019-11-06 DIAGNOSIS — I4821 Permanent atrial fibrillation: Secondary | ICD-10-CM | POA: Diagnosis not present

## 2019-11-06 DIAGNOSIS — D72819 Decreased white blood cell count, unspecified: Secondary | ICD-10-CM

## 2019-11-06 DIAGNOSIS — I495 Sick sinus syndrome: Secondary | ICD-10-CM | POA: Diagnosis not present

## 2019-11-06 DIAGNOSIS — Z7189 Other specified counseling: Secondary | ICD-10-CM | POA: Diagnosis not present

## 2019-11-06 DIAGNOSIS — E538 Deficiency of other specified B group vitamins: Secondary | ICD-10-CM | POA: Diagnosis not present

## 2019-11-06 LAB — CBC WITH DIFFERENTIAL/PLATELET
Basophils Absolute: 0 10*3/uL (ref 0.0–0.1)
Basophils Relative: 0.5 % (ref 0.0–3.0)
Eosinophils Absolute: 0.1 10*3/uL (ref 0.0–0.7)
Eosinophils Relative: 2.2 % (ref 0.0–5.0)
HCT: 37.2 % (ref 36.0–46.0)
Hemoglobin: 12.5 g/dL (ref 12.0–15.0)
Lymphocytes Relative: 13.9 % (ref 12.0–46.0)
Lymphs Abs: 0.8 10*3/uL (ref 0.7–4.0)
MCHC: 33.7 g/dL (ref 30.0–36.0)
MCV: 93.1 fl (ref 78.0–100.0)
Monocytes Absolute: 0.6 10*3/uL (ref 0.1–1.0)
Monocytes Relative: 9.9 % (ref 3.0–12.0)
Neutro Abs: 4.2 10*3/uL (ref 1.4–7.7)
Neutrophils Relative %: 73.5 % (ref 43.0–77.0)
Platelets: 148 10*3/uL — ABNORMAL LOW (ref 150.0–400.0)
RBC: 4 Mil/uL (ref 3.87–5.11)
RDW: 13.2 % (ref 11.5–15.5)
WBC: 5.7 10*3/uL (ref 4.0–10.5)

## 2019-11-06 LAB — IBC + FERRITIN
Ferritin: 19 ng/mL (ref 10.0–291.0)
Iron: 83 ug/dL (ref 42–145)
Saturation Ratios: 18.2 % — ABNORMAL LOW (ref 20.0–50.0)
Transferrin: 326 mg/dL (ref 212.0–360.0)

## 2019-11-06 LAB — FOLATE: Folate: 19.3 ng/mL (ref >5.9)

## 2019-11-06 LAB — VITAMIN B12: Vitamin B-12: 291 pg/mL (ref 211–911)

## 2019-11-06 NOTE — Patient Instructions (Addendum)
After Visit Summary:  We will be checking the following labs today - NONE   Medication Instructions:    Continue with your current medicines.    If you need a refill on your cardiac medications before your next appointment, please call your pharmacy.     Testing/Procedures To Be Arranged:  N/A  Follow-Up:   See Dr. Skains in 6 months -  You will receive a reminder letter in the mail two months in advance. If you don't receive a letter, please call our office to schedule the follow-up appointment.    At [facility], you and your health needs are our priority.  As part of our continuing mission to provide you with exceptional heart care, we have created designated Provider Care Teams.  These Care Teams include your primary Cardiologist (physician) and Advanced Practice Providers (APPs -  Physician Assistants and Nurse Practitioners) who all work together to provide you with the care you need, when you need it.  Special Instructions:  . Stay safe, stay home, wash your hands for at least 20 seconds and wear a mask when out in public.  . It was good to talk with you today.    Call the [facility] office at [phone] if you have any questions, problems or concerns.       

## 2019-11-07 ENCOUNTER — Other Ambulatory Visit: Payer: Medicare HMO

## 2019-11-09 ENCOUNTER — Ambulatory Visit: Payer: Medicare HMO | Admitting: Hematology and Oncology

## 2019-11-13 ENCOUNTER — Ambulatory Visit: Payer: Medicare HMO | Admitting: Nurse Practitioner

## 2019-11-13 DIAGNOSIS — H353231 Exudative age-related macular degeneration, bilateral, with active choroidal neovascularization: Secondary | ICD-10-CM | POA: Diagnosis not present

## 2019-11-13 DIAGNOSIS — H35363 Drusen (degenerative) of macula, bilateral: Secondary | ICD-10-CM | POA: Diagnosis not present

## 2019-11-13 DIAGNOSIS — Z961 Presence of intraocular lens: Secondary | ICD-10-CM | POA: Diagnosis not present

## 2019-11-13 DIAGNOSIS — H35453 Secondary pigmentary degeneration, bilateral: Secondary | ICD-10-CM | POA: Diagnosis not present

## 2019-11-15 ENCOUNTER — Other Ambulatory Visit (INDEPENDENT_AMBULATORY_CARE_PROVIDER_SITE_OTHER): Payer: Self-pay | Admitting: Family Medicine

## 2019-11-15 NOTE — Telephone Encounter (Signed)
Please review

## 2019-11-17 ENCOUNTER — Telehealth: Payer: Self-pay | Admitting: Family Medicine

## 2019-11-17 MED ORDER — LOSARTAN POTASSIUM 25 MG PO TABS: 25.0000 mg | ORAL_TABLET | Freq: Every day | ORAL | 1 refills | Status: DC

## 2019-11-17 NOTE — Telephone Encounter (Signed)
Rx done. 

## 2019-11-17 NOTE — Telephone Encounter (Signed)
losartan (COZAAR) 25 MG tablet  [place], [place] Phone:  [phone]  Fax:  [phone]

## 2019-11-27 DIAGNOSIS — H353211 Exudative age-related macular degeneration, right eye, with active choroidal neovascularization: Secondary | ICD-10-CM | POA: Diagnosis not present

## 2019-12-25 ENCOUNTER — Other Ambulatory Visit (INDEPENDENT_AMBULATORY_CARE_PROVIDER_SITE_OTHER): Payer: Self-pay | Admitting: Family Medicine

## 2019-12-25 DIAGNOSIS — K219 Gastro-esophageal reflux disease without esophagitis: Secondary | ICD-10-CM

## 2020-01-01 DIAGNOSIS — H353221 Exudative age-related macular degeneration, left eye, with active choroidal neovascularization: Secondary | ICD-10-CM | POA: Diagnosis not present

## 2020-01-10 DIAGNOSIS — H353211 Exudative age-related macular degeneration, right eye, with active choroidal neovascularization: Secondary | ICD-10-CM | POA: Diagnosis not present

## 2020-01-31 ENCOUNTER — Ambulatory Visit (INDEPENDENT_AMBULATORY_CARE_PROVIDER_SITE_OTHER): Payer: Medicare HMO | Admitting: *Deleted

## 2020-01-31 DIAGNOSIS — I495 Sick sinus syndrome: Secondary | ICD-10-CM | POA: Diagnosis not present

## 2020-01-31 LAB — CUP PACEART REMOTE DEVICE CHECK
Battery Remaining Longevity: 125 mo
Battery Remaining Percentage: 95.5 %
Battery Voltage: 3.01 V
Brady Statistic RV Percent Paced: 98 %
Date Time Interrogation Session: 20210714020011
Implantable Lead Implant Date: 20191009
Implantable Lead Location: 753860
Implantable Lead Model: 1948
Implantable Pulse Generator Implant Date: 20191009
Lead Channel Impedance Value: 640 Ohm
Lead Channel Pacing Threshold Amplitude: 0.75 V
Lead Channel Pacing Threshold Pulse Width: 0.5 ms
Lead Channel Sensing Intrinsic Amplitude: 12 mV
Lead Channel Setting Pacing Amplitude: 2.5 V
Lead Channel Setting Pacing Pulse Width: 0.5 ms
Lead Channel Setting Sensing Sensitivity: 2 mV
Pulse Gen Model: 1272
Pulse Gen Serial Number: 9067558

## 2020-02-01 NOTE — Progress Notes (Signed)
Remote pacemaker transmission.   

## 2020-02-14 ENCOUNTER — Other Ambulatory Visit: Payer: Self-pay

## 2020-02-14 MED ORDER — BISOPROLOL FUMARATE 5 MG PO TABS: 5.0000 mg | ORAL_TABLET | Freq: Every day | ORAL | 0 refills | Status: DC

## 2020-02-28 DIAGNOSIS — H353221 Exudative age-related macular degeneration, left eye, with active choroidal neovascularization: Secondary | ICD-10-CM | POA: Diagnosis not present

## 2020-03-08 ENCOUNTER — Encounter: Payer: Self-pay | Admitting: Family Medicine

## 2020-03-08 ENCOUNTER — Other Ambulatory Visit: Payer: Self-pay

## 2020-03-08 ENCOUNTER — Ambulatory Visit (INDEPENDENT_AMBULATORY_CARE_PROVIDER_SITE_OTHER): Payer: Medicare HMO | Admitting: Family Medicine

## 2020-03-08 VITALS — BP 118/66 | HR 65 | Temp 97.5°F | Wt 123.0 lb

## 2020-03-08 DIAGNOSIS — J301 Allergic rhinitis due to pollen: Secondary | ICD-10-CM | POA: Diagnosis not present

## 2020-03-08 DIAGNOSIS — M25561 Pain in right knee: Secondary | ICD-10-CM | POA: Diagnosis not present

## 2020-03-08 DIAGNOSIS — M25461 Effusion, right knee: Secondary | ICD-10-CM | POA: Diagnosis not present

## 2020-03-08 MED ORDER — METHYLPREDNISOLONE ACETATE 40 MG/ML IJ SUSP
40.0000 mg | Freq: Once | INTRAMUSCULAR | Status: AC
Start: 2020-03-08 — End: 2020-03-08
  Administered 2020-03-08: 40 mg via INTRALESIONAL

## 2020-03-08 NOTE — Patient Instructions (Signed)
Try the plain Zyrtec and Flonase  Let me know in 2 weeks if knee no better.

## 2020-03-08 NOTE — Progress Notes (Signed)
Established Patient Office Visit  Subjective:  Patient ID: Megan Olsen, female    DOB: Apr 25, 1928  Age: 86 y.o. MRN: [id number]  CC:  Chief Complaint  Patient presents with  . Knee Pain    right knee pain and sweilling     HPI Megan Olsen presents for the following concerns  Right knee edema.  She is not sure of exact duration but least few weeks.  Denies any injury.  No locking or giving way.  She has some stiffness as well as soreness and pain is somewhat diffuse.  She is not noted any warmth or erythema.  No bruising.  Denies any recent falls.  No locking or giving way.  Denies any major prior knee difficulties.  She tried some over-the-counter diclofenac gel without much improvement.  Second issue is increased nasal congestion.  Symptoms seem worse on the left side.  She has frequent postnasal drip.  Occasional hoarseness.  She has taken Zyrtec and Flonase in the past but none recently.  Denies any recent purulent secretions.  No fever.  No chills.  She has not had Covid vaccine.  Denies any cough.  No dyspnea.  Past Medical History:  Diagnosis Date  . Age-related macular degeneration, wet, both eyes ([place])   . Anxiety disorder 07/06/2014  . Arthritis    "all over" (04/27/2018)  . Atrial fibrillation ([place])    catheter ablation of SVT in 2002  . Benign essential HTN 07/06/2014  . Constipation, intermittent 08/04/2007  . CVA (cerebrovascular accident) ([place]) 2003   left brain; denies residual on 04/27/2018  . Depression   . GERD (gastroesophageal reflux disease)   . Heart murmur    hx (04/27/2018)  . Hypothyroidism 09/07/2014   "off RX now" (04/27/2018)  . Iron deficiency anemia   . Iron deficiency anemia, unspecified 01/02/2013  . Osteoarthritis   . Osteoporosis 01/11/2007  . Pneumonia    "one time; years and years ago" (04/27/2018)  . Presence of permanent cardiac pacemaker 04/27/2018    Dual Chamber  . Right BBB/left ant fasc block     Past Surgical History:    Procedure Laterality Date  . APPENDECTOMY    . CATARACT EXTRACTION W/ INTRAOCULAR LENS  IMPLANT, BILATERAL Bilateral   . CHOLECYSTECTOMY OPEN    . COLONOSCOPY    . EYE SURGERY    . FRACTURE SURGERY    . HIP PINNING,CANNULATED Left 07/07/2014   Procedure: CANNULATED HIP PINNING;  Surgeon: Mauri Pole, MD;  [facility]: [facility];  Service: Orthopedics;  Laterality: Left;  . INSERT / REPLACE / REMOVE PACEMAKER  04/27/2018    Dual Chamber  . JOINT REPLACEMENT    . LAPAROSCOPIC OVARIAN CYSTECTOMY    . LEFT HEART CATH AND CORONARY ANGIOGRAPHY N/A 01/31/2019   Procedure: LEFT HEART CATH AND CORONARY ANGIOGRAPHY;  Surgeon: Leonie Man, MD;  Location: [facility];  Service: Cardiovascular;  Laterality: N/A;  . NASAL SEPTUM SURGERY    . PACEMAKER IMPLANT N/A 04/27/2018   Procedure: PACEMAKER IMPLANT - Dual Chamber;  Surgeon: Deboraha Sprang, MD;  Location: [facility];  Service: Cardiovascular;  Laterality: N/A;  . SVT ABLATION  2002  . TONSILLECTOMY    . TOTAL ABDOMINAL HYSTERECTOMY    . TOTAL HIP ARTHROPLASTY Right 02/23/2017   Procedure: RIGHT TOTAL HIP ARTHROPLASTY ANTERIOR APPROACH;  Surgeon: Paralee Cancel, MD;  [facility];  Service: Orthopedics;  Laterality: Right;  . VITRECTOMY Right 2008   dr Zadie Rhine.  Vitrectomy and removal of tissue.     Family History  Problem Relation Age of Onset  . Early death Mother   . Nephritis Mother   . Kidney disease Mother   . Heart attack Father   . Heart disease Sister   . Dementia Sister   . Heart attack Brother   . Dementia Sister     Social History   Socioeconomic History  . Marital status: Single    Spouse name: Not on file  . Number of children: Not on file  . Years of education: Not on file  . Highest education level: Not on file  Occupational History  . Not on file  Tobacco Use  . Smoking status: Never Smoker  . Smokeless tobacco: Never Used  Vaping Use  . Vaping Use: Never used  Substance and Sexual  Activity  . Alcohol use: Yes    Comment: 04/27/2018 "a beer q 3 months or so; if that"  . Drug use: Never  . Sexual activity: Not Currently  Other Topics Concern  . Not on file  Social History Narrative  . Not on file   Social Determinants of Health   Financial Resource Strain:   . Difficulty of Paying Living Expenses: Not on file  Food Insecurity:   . Worried About Charity fundraiser in the Last Year: Not on file  . Ran Out of Food in the Last Year: Not on file  Transportation Needs:   . Lack of Transportation (Medical): Not on file  . Lack of Transportation (Non-Medical): Not on file  Physical Activity:   . Days of Exercise per Week: Not on file  . Minutes of Exercise per Session: Not on file  Stress:   . Feeling of Stress : Not on file  Social Connections:   . Frequency of Communication with Friends and Family: Not on file  . Frequency of Social Gatherings with Friends and Family: Not on file  . Attends Religious Services: Not on file  . Active Member of Clubs or Organizations: Not on file  . Attends Archivist Meetings: Not on file  . Marital Status: Not on file  Intimate Partner Violence:   . Fear of Current or Ex-Partner: Not on file  . Emotionally Abused: Not on file  . Physically Abused: Not on file  . Sexually Abused: Not on file    Outpatient Medications Prior to Visit  Medication Sig Dispense Refill  . acetaminophen (TYLENOL) 650 MG CR tablet Take 650 mg by mouth every 8 (eight) hours as needed for pain.     . bisoprolol (ZEBETA) 5 MG tablet Take 1 tablet (5 mg total) by mouth daily. Please make yearly appt with Dr. Caryl Comes for September before anymore refills. 1st attempt 90 tablet 0  . Carboxymethylcellulose Sodium (THERATEARS) 0.25 % SOLN Place 1 drop into both eyes 3 (three) times daily as needed (for dry/irritated eyes.).    Marland Kitchen Cholecalciferol (VITAMIN D3) 2000 units TABS Take 2,000 Units by mouth daily.    Marland Kitchen dexlansoprazole (DEXILANT) 60 MG capsule  Take 1 capsule (60 mg total) by mouth daily. 90 capsule 3  . ELIQUIS 2.5 MG TABS tablet TAKE 1 TABLET TWICE DAILY 180 tablet 2  . hydrochlorothiazide (MICROZIDE) 12.5 MG capsule TAKE 1 CAPSULE EVERY DAY 90 capsule 1  . ipratropium (ATROVENT) 0.03 % nasal spray Place 2 sprays into both nostrils every 12 (twelve) hours. 30 mL 12  . LORazepam (ATIVAN) 1 MG tablet Take 1 tablet (1  mg total) by mouth at bedtime as needed for anxiety or sleep. 90 tablet 1  . losartan (COZAAR) 25 MG tablet Take 1 tablet (25 mg total) by mouth daily. 90 tablet 1  . Multiple Vitamins-Minerals (PRESERVISION AREDS 2) CAPS Take 1 capsule by mouth 2 (two) times daily. 90 capsule 3  . nitroGLYCERIN (NITROSTAT) 0.4 MG SL tablet Place 1 tablet (0.4 mg total) under the tongue every 5 (five) minutes x 3 doses as needed for chest pain. 30 tablet 0  . NON FORMULARY Place 1 Dose into both eyes See admin instructions. Receives injections into both eyes every 5-8 weeks at Dr. Serita Grit office [facility])    . traZODone (DESYREL) 50 MG tablet Take 0.5-1 tablets (25-50 mg total) by mouth at bedtime as needed. for sleep 90 tablet 1   No facility-administered medications prior to visit.    Allergies  Allergen Reactions  . Propoxyphene N-Acetaminophen Nausea Only  . Celecoxib Itching and Rash  . Fexofenadine Rash  . Penicillins Hives and Rash    Has patient had a PCN reaction causing immediate rash, facial/tongue/throat swelling, SOB or lightheadedness with hypotension: Yes Has patient had a PCN reaction causing severe rash involving mucus membranes or skin necrosis: No Has patient had a PCN reaction that required hospitalization: No Has patient had a PCN reaction occurring within the last 10 years: No If all of the above answers are "NO", then may proceed with Cephalosporin use.     ROS Review of Systems  Constitutional: Negative for chills and fever.  HENT: Positive for congestion and postnasal drip. Negative for  sore throat.   Respiratory: Negative for cough and shortness of breath.   Cardiovascular: Negative for chest pain.  Genitourinary: Negative for dysuria.  Musculoskeletal: Positive for arthralgias.  Neurological: Negative for headaches.      Objective:    Physical Exam Vitals reviewed.  HENT:     Right Ear: Tympanic membrane and ear canal normal.     Left Ear: Tympanic membrane and ear canal normal.     Mouth/Throat:     Pharynx: Oropharynx is clear. No oropharyngeal exudate or posterior oropharyngeal erythema.  Cardiovascular:     Rate and Rhythm: Normal rate.  Pulmonary:     Effort: Pulmonary effort is normal.     Breath sounds: Normal breath sounds.  Musculoskeletal:     Comments: Right knee reveals full range of motion.  No bony tenderness.  She has mild medial joint line tenderness.  Small effusion.  No warmth.  No erythema.  Ligament testing is normal  Skin:    Findings: No rash.  Neurological:     Mental Status: She is alert.     BP 118/66 (BP Location: Left Arm, Patient Position: Sitting, Cuff Size: Normal)   Pulse 65   Temp (!) 97.5 F (36.4 C) (Oral)   Wt 123 lb (55.8 kg)   SpO2 97%   BMI 21.11 kg/m  Wt Readings from Last 3 Encounters:  03/08/20 123 lb (55.8 kg)  11/06/19 123 lb 8 oz (56 kg)  09/12/19 127 lb (57.6 kg)     Health Maintenance Due  Topic Date Due  . COVID-19 Vaccine (1) Never done  . INFLUENZA VACCINE  02/18/2020    There are no preventive care reminders to display for this patient.  Lab Results  Component Value Date   TSH 3.89 08/09/2019   Lab Results  Component Value Date   WBC 5.7 11/06/2019   HGB 12.5 11/06/2019  HCT 37.2 11/06/2019   MCV 93.1 11/06/2019   PLT 148.0 (L) 11/06/2019   Lab Results  Component Value Date   NA 139 08/09/2019   K 4.2 08/09/2019   CO2 29 08/09/2019   GLUCOSE 82 08/09/2019   BUN 16 08/09/2019   CREATININE 0.74 08/09/2019   BILITOT 0.4 08/09/2019   ALKPHOS 115 08/09/2019   AST 17  08/09/2019   ALT 11 08/09/2019   PROT 6.4 08/09/2019   ALBUMIN 4.1 08/09/2019   CALCIUM 9.1 08/09/2019   ANIONGAP 12 02/01/2019   GFR 73.77 08/09/2019   Lab Results  Component Value Date   CHOL 146 08/09/2019   Lab Results  Component Value Date   HDL 65.60 08/09/2019   Lab Results  Component Value Date   LDLCALC 62 08/09/2019   Lab Results  Component Value Date   TRIG 92.0 08/09/2019   Lab Results  Component Value Date   CHOLHDL 2 08/09/2019   No results found for: HGBA1C    Assessment & Plan:   Problem List Items Addressed This Visit      Unprioritized   Allergic rhinitis    Other Visit Diagnoses    Right knee pain, unspecified chronicity    -  Primary   Relevant Medications   methylPREDNISolone acetate (DEPO-MEDROL) injection 40 mg (Completed)   Effusion of right knee        Patient has what looks like seasonal allergies and has taken Zyrtec and Flonase in the past and we recommend getting back on both of those  Right knee effusion without reported injury.  Etiology unclear.  She certainly may have some osteoarthritis but cannot rule out meniscal injury though she reports no injury  We discussed option of steroid injection right knee for relief since she has not gotten any improvement with over-the-counter topical diclofenac. We discussed risk of corticosteroid injection of the knee including risk of bruising, bleeding, low risk of infection and patient consented.  Knee was prepped with Betadine.  Using 25-gauge 1-1/2 inch needle injected 40 mg Depo-Medrol and 2 cc of plain Xylocaine and patient tolerated well with no complications.  We have asked that she be in touch in the next couple weeks if not seeing further improvement.  Meds ordered this encounter  Medications  . methylPREDNISolone acetate (DEPO-MEDROL) injection 40 mg    Follow-up: No follow-ups on file.    Carolann Littler, MD

## 2020-03-11 ENCOUNTER — Telehealth: Payer: Self-pay | Admitting: Family Medicine

## 2020-03-11 DIAGNOSIS — M25561 Pain in right knee: Secondary | ICD-10-CM

## 2020-03-11 NOTE — Telephone Encounter (Signed)
I think we need to start by getting X-ray of her right knee.   Go ahead and order and we will go from there.  Continue with intermittent icing in the meantime   she could consider also getting OTC elastic knee sleeve which may offer some compression and give additional support.

## 2020-03-11 NOTE — Telephone Encounter (Signed)
Patient seen Dr. Elease Hashimoto 03/08/2020 and had an injection on the inside of knee. Now she can't walk without a cane and hurting on the outside of her knee and pain in the back of her leg.  Please Advise

## 2020-03-11 NOTE — Telephone Encounter (Signed)
May be better for Dr. Elease Hashimoto to address since he examined just a few days ago and did injection.   Please ask her and send response to him: any additional swelling? Warmth? Redness? Since injection?

## 2020-03-11 NOTE — Telephone Encounter (Signed)
Spoke with the pt and she stated there is increased swelling above the knee and along the posterior aspect of her leg since this morning.  Patient denies any redness or warmth, or color changes, complains of difficulty straightening the knee and stated she used ice which has helped some.  Stated she feels the effects of the injection has worn off since this morning as she was OK on Friday, Saturday and Sunday.  Patient stated she is having difficulty walking due to the pain.  Message sent to Dr Elease Hashimoto.

## 2020-03-12 ENCOUNTER — Other Ambulatory Visit: Payer: Self-pay

## 2020-03-12 ENCOUNTER — Ambulatory Visit (INDEPENDENT_AMBULATORY_CARE_PROVIDER_SITE_OTHER)
Admission: RE | Admit: 2020-03-12 | Discharge: 2020-03-12 | Disposition: A | Discharge status: Home / Self Care | Payer: Medicare HMO | Source: Ambulatory Visit | Attending: Family Medicine | Admitting: Family Medicine

## 2020-03-12 DIAGNOSIS — M25561 Pain in right knee: Secondary | ICD-10-CM | POA: Diagnosis not present

## 2020-03-12 DIAGNOSIS — M25461 Effusion, right knee: Secondary | ICD-10-CM | POA: Diagnosis not present

## 2020-03-12 DIAGNOSIS — S8991XA Unspecified injury of right lower leg, initial encounter: Secondary | ICD-10-CM | POA: Diagnosis not present

## 2020-03-12 NOTE — Telephone Encounter (Signed)
Left a message for the pt to return my call.  

## 2020-03-12 NOTE — Telephone Encounter (Signed)
Patient called back and was informed of the message below.  Patient is aware the x-ray order was entered and given the address to the [place] office.

## 2020-03-12 NOTE — Addendum Note (Signed)
Addended by: Agnes Lawrence on: 03/12/2020 08:59 AM   Modules accepted: Orders

## 2020-03-20 DIAGNOSIS — H353211 Exudative age-related macular degeneration, right eye, with active choroidal neovascularization: Secondary | ICD-10-CM | POA: Diagnosis not present

## 2020-04-01 ENCOUNTER — Telehealth: Payer: Self-pay | Admitting: Family Medicine

## 2020-04-01 DIAGNOSIS — J349 Unspecified disorder of nose and nasal sinuses: Secondary | ICD-10-CM

## 2020-04-01 NOTE — Telephone Encounter (Signed)
Pt stated she is still having the same problems. She continues to have a raspy voice, drainage down her throat, and at night she feels like she is choking (thinks it may be acid reflux for the choking). Pt stated this has ben on and off for 2 years but the last few months (since May 2021) it have been worse. She feels she has not been taken seriously and would like advice on what her PCP would like to do?   She has also been out of her acid reflux med that was px by Dr. Juleen China. She is retired and was told that her PCP would be filling her medications. [facility] and after this to be [facility] mail pharmacy.   While speaking to the pt it was very hard to understand her due to the very raspy voice. Pt feels very over whelmed with this and does not feel heard.   [id number] [place], [place] - [address] AT [address] Phone:  [phone]  Fax:  [phone]      Pt can be reached at [phone]

## 2020-04-03 ENCOUNTER — Other Ambulatory Visit: Payer: Self-pay | Admitting: Family Medicine

## 2020-04-03 DIAGNOSIS — K219 Gastro-esophageal reflux disease without esophagitis: Secondary | ICD-10-CM

## 2020-04-03 MED ORDER — DEXILANT 60 MG PO CPDR: 1.0000 | DELAYED_RELEASE_CAPSULE | Freq: Every day | ORAL | 0 refills | Status: DC

## 2020-04-03 MED ORDER — DEXILANT 60 MG PO CPDR: 1.0000 | DELAYED_RELEASE_CAPSULE | Freq: Every day | ORAL | 3 refills | Status: DC

## 2020-04-03 NOTE — Telephone Encounter (Signed)
Spoke with the pt and informed her of the message below.  Patient stated she has been taking all of the medications as below with no relief.  Patient is aware the referral was placed to ENT and someone will call with appt info.  Stated she would prefer to continue OTC meds, Dexilant and will call back for a phone visit if needed.

## 2020-04-03 NOTE — Telephone Encounter (Signed)
*  I have only seen her one time and that was back in January for establish care visit. We did discuss ongoing sinus concerns, and I advised taking the zyrtec every day (she was only using intermittently). I do believe she has taken flonase as well; and usually with sinus issues that are significant, doing both daily is best bet. We also had discussed nasal saline rinses. If she has done all of these things regularly (daily) for at least a couple of months and is not getting relief then it may be best to see ENT. She has had these symptoms for a long time. It is important to take medications daily, because allergy response will continue to occur for 2 weeks after exposure to allergen (so if she misses a dose she will back slide with symptoms). I do feel that switching up allergy meds can sometimes be helpful - suggest allegra. I also think that plain mucinex can be helpful. Sometimes we additionally will use a combination nasal spray (flonase-azelastine) to get the antihistamine-steroid combination, but she does have to be careful with flonase if glaucoma/cateracts.  *so ok for ENT referral if desired. May benefit from virtual visit with [facility] to discuss since i'm not in this week.   *I did send dexilant local and mail order rx. (certainly reflux can worsen the sensation/choking/voice)  *if she feels that sinus congestion is acutely worse (pain, worried about infection) please try to get her in with [facility].

## 2020-04-07 NOTE — Progress Notes (Signed)
Cardiology Office Note Date:  04/09/2020  Patient ID:  Megan Olsen 07-02-1928, MRN [id number] PCP:  Caren Macadam, MD  Cardiologist: Dr.Skains, L. Servando Snare, NP Electrophysiologist: Dr. Caryl Comes    Chief Complaint:  annual device visit  History of Present Illness: Megan Olsen is a 86 y.o. female with history of NSTEMI  (July 2020 Takotsubo - coronaries are normal), stroke, HTN, AFib, tachy-brady PPM, VHD w/mod MR.   She comes in today to be seen for Dr. Caryl Comes, last seen by him Sept 2020, doing well.  Noted blunted HR (on BB) and reprogrammed lower rate from 55-65 and activated rate response at auto +0 and decreased the slope from 8--7.  More recently saw L. Servando Snare, NP April 2021 was doing well, activities reduced with declining vision, wdoing OK.  No changes were made.   TODAY She is accompanied by her close friend/neighbor. She has developed new voice change/progressive hoarseness, has seen her PMD with some sinus issues/PND and started on antihistamines and PPI Pending call back on a referral to a specialist with no improvement.  Cardiac-wise she feels well. Still lives independently in her own home, active caring for her home, takes care of her plants and walks for exercise She has macular degeneration with worsened vision No CP, palpitations or SOB, no DOE No dizzy spells, near syncope or syncope.  No bleeding or signs of bleeding No falls   Device information SJM single chamber PPM implanted 04/27/2018    Past Medical History:  Diagnosis Date  . Age-related macular degeneration, wet, both eyes ([place])   . Anxiety disorder 07/06/2014  . Arthritis    "all over" (04/27/2018)  . Atrial fibrillation ([place])    catheter ablation of SVT in 2002  . Benign essential HTN 07/06/2014  . Constipation, intermittent 08/04/2007  . CVA (cerebrovascular accident) ([place]) 2003   left brain; denies residual on 04/27/2018  . Depression   . GERD (gastroesophageal reflux  disease)   . Heart murmur    hx (04/27/2018)  . Hypothyroidism 09/07/2014   "off RX now" (04/27/2018)  . Iron deficiency anemia   . Iron deficiency anemia, unspecified 01/02/2013  . Osteoarthritis   . Osteoporosis 01/11/2007  . Pneumonia    "one time; years and years ago" (04/27/2018)  . Presence of permanent cardiac pacemaker 04/27/2018    Dual Chamber  . Right BBB/left ant fasc block     Past Surgical History:  Procedure Laterality Date  . APPENDECTOMY    . CATARACT EXTRACTION W/ INTRAOCULAR LENS  IMPLANT, BILATERAL Bilateral   . CHOLECYSTECTOMY OPEN    . COLONOSCOPY    . EYE SURGERY    . FRACTURE SURGERY    . HIP PINNING,CANNULATED Left 07/07/2014   Procedure: CANNULATED HIP PINNING;  Surgeon: Mauri Pole, MD;  Location: [facility];  Service: Orthopedics;  Laterality: Left;  . INSERT / REPLACE / REMOVE PACEMAKER  04/27/2018    Dual Chamber  . JOINT REPLACEMENT    . LAPAROSCOPIC OVARIAN CYSTECTOMY    . LEFT HEART CATH AND CORONARY ANGIOGRAPHY N/A 01/31/2019   Procedure: LEFT HEART CATH AND CORONARY ANGIOGRAPHY;  Surgeon: Leonie Man, MD;  Location: [facility];  Service: Cardiovascular;  Laterality: N/A;  . NASAL SEPTUM SURGERY    . PACEMAKER IMPLANT N/A 04/27/2018   Procedure: PACEMAKER IMPLANT - Dual Chamber;  Surgeon: Deboraha Sprang, MD;  Location: [facility];  Service: Cardiovascular;  Laterality: N/A;  . SVT ABLATION  2002  .  TONSILLECTOMY    . TOTAL ABDOMINAL HYSTERECTOMY    . TOTAL HIP ARTHROPLASTY Right 02/23/2017   Procedure: RIGHT TOTAL HIP ARTHROPLASTY ANTERIOR APPROACH;  Surgeon: Paralee Cancel, MD;  Location: [facility];  Service: Orthopedics;  Laterality: Right;  . VITRECTOMY Right 2008   dr Zadie Rhine.  Vitrectomy and removal of tissue.     Current Outpatient Medications  Medication Sig Dispense Refill  . acetaminophen (TYLENOL) 650 MG CR tablet Take 650 mg by mouth every 8 (eight) hours as needed for pain.     . bisoprolol (ZEBETA) 5 MG tablet Take 1  tablet (5 mg total) by mouth daily. Please make yearly appt with Dr. Caryl Comes for September before anymore refills. 1st attempt 90 tablet 0  . Carboxymethylcellulose Sodium (THERATEARS) 0.25 % SOLN Place 1 drop into both eyes 3 (three) times daily as needed (for dry/irritated eyes.).    Marland Kitchen Cholecalciferol (VITAMIN D3) 2000 units TABS Take 2,000 Units by mouth daily.    Marland Kitchen dexlansoprazole (DEXILANT) 60 MG capsule Take 1 capsule (60 mg total) by mouth daily. 90 capsule 3  . ELIQUIS 2.5 MG TABS tablet TAKE 1 TABLET TWICE DAILY 180 tablet 2  . hydrochlorothiazide (MICROZIDE) 12.5 MG capsule TAKE 1 CAPSULE EVERY DAY 90 capsule 1  . ipratropium (ATROVENT) 0.03 % nasal spray Place 2 sprays into both nostrils every 12 (twelve) hours. 30 mL 12  . LORazepam (ATIVAN) 1 MG tablet Take 1 tablet (1 mg total) by mouth at bedtime as needed for anxiety or sleep. 90 tablet 1  . losartan (COZAAR) 25 MG tablet Take 1 tablet (25 mg total) by mouth daily. 90 tablet 1  . Multiple Vitamins-Minerals (PRESERVISION AREDS 2) CAPS Take 1 capsule by mouth 2 (two) times daily. 90 capsule 3  . nitroGLYCERIN (NITROSTAT) 0.4 MG SL tablet Place 1 tablet (0.4 mg total) under the tongue every 5 (five) minutes x 3 doses as needed for chest pain. 30 tablet 0  . NON FORMULARY Place 1 Dose into both eyes See admin instructions. Receives injections into both eyes every 5-8 weeks at Dr. Serita Grit office [facility])    . traZODone (DESYREL) 50 MG tablet Take 0.5-1 tablets (25-50 mg total) by mouth at bedtime as needed. for sleep 90 tablet 1   No current facility-administered medications for this visit.    Allergies:   Propoxyphene n-acetaminophen, Celecoxib, Fexofenadine, and Penicillins   Social History:  The patient  reports that she has never smoked. She has never used smokeless tobacco. She reports current alcohol use. She reports that she does not use drugs.   Family History:  The patient's family history includes Dementia in  her sister and sister; Early death in her mother; Heart attack in her brother and father; Heart disease in her sister; Kidney disease in her mother; Nephritis in her mother.  ROS:  Please see the history of present illness.    All other systems are reviewed and otherwise negative.   PHYSICAL EXAM:  VS:  BP (!) 142/74   Pulse 65   Ht 5\' 4"  (1.626 m)   Wt 124 lb (56.2 kg)   SpO2 99%   BMI 21.28 kg/m  BMI: Body mass index is 21.28 kg/m. Well nourished, well developed, in no acute distress HEENT: normocephalic, atraumatic Neck: no JVD, carotid bruits or masses Cardiac: RRR; no significant murmurs, no rubs, or gallops Lungs:  CTA b/l, no wheezing, rhonchi or rales Abd: soft, nontender MS: no deformity or atrophy Ext: no edema Skin: warm and  dry, no rash Neuro:  No gross deficits appreciated Psych: euthymic mood, full affect  PPM site is stable, no tethering or discomfort   EKG:  Done today and reviewed by myself shows  V paced 65bpm  Device interrogation done today and reviewed by myself:  Battery and lead measurements are good VP 97% No HVR Underlying is VS 40's with some V paced beats intermittently    02/17/2019: TTE IMPRESSIONS  1. Left atrial size was severely dilated.  2. Right atrial size was severely dilated.  3. Trivial pericardial effusion is present.  4. Mild mitral valve prolapse.  5. The mitral valve is myxomatous. Mitral valve regurgitation is moderate  by color flow Doppler.  6. Tricuspid valve regurgitation is moderate-severe.  7. The aortic root and ascending aorta are normal in size and structure.  8. The left ventricle has normal systolic function, with an ejection  fraction of 55-60%.     01/31/2019: TTE  There is mild left ventricular systolic dysfunction -with apical ballooning in Takotsubo pattern. The left ventricular ejection fraction is 45-50% by visual estimate.  LV end diastolic pressure is normal.  Angiographically normal  coronary arteries with a large right dominant system   SUMMARY Angiographically normal coronary arteries Mildly reduced LVEF of roughly 45% and Takotsubo pattern --> suspect stress-induced cardiomyopathy     Recent Labs: 05/12/2019: Magnesium 1.8 08/09/2019: ALT 11; BUN 16; Creatinine, Ser 0.74; Potassium 4.2; Sodium 139; TSH 3.89 11/06/2019: Hemoglobin 12.5; Platelets 148.0  08/09/2019: Cholesterol 146; HDL 65.60; LDL Cholesterol 62; Total CHOL/HDL Ratio 2; Triglycerides 92.0; VLDL 18.4   CrCl cannot be calculated (Patient's most recent lab result is older than the maximum 21 days allowed.).   Wt Readings from Last 3 Encounters:  04/09/20 124 lb (56.2 kg)  03/08/20 123 lb (55.8 kg)  11/06/19 123 lb 8 oz (56 kg)     Other studies reviewed: Additional studies/records reviewed today include: summarized above  ASSESSMENT AND PLAN:  1. PPM     Intact function, no programming changes made  2. AFib (permanent)     CHA2DS2Vasc is 7, on Eliquis, appropriately dosed for age/weight     asymptomatic  3. Tokotsubo MC     Recovered LV     No symptoms or exam findings of volume OL or change  4. HTN     No changes today    Disposition: F/u with remotes Q 5mo, and in clinic with EP in 1 year, sooner if needed.  Current medicines are reviewed at length with the patient today.  The patient did not have any concerns regarding medicines.  Megan Night, PA-C 04/09/2020 1:45 PM     [address] [place] [address] [phone] (office)  [phone] (fax)

## 2020-04-09 ENCOUNTER — Ambulatory Visit (INDEPENDENT_AMBULATORY_CARE_PROVIDER_SITE_OTHER): Payer: Medicare HMO | Admitting: Physician Assistant

## 2020-04-09 ENCOUNTER — Other Ambulatory Visit: Payer: Self-pay

## 2020-04-09 VITALS — BP 142/74 | HR 65 | Ht 64.0 in | Wt 124.0 lb

## 2020-04-09 DIAGNOSIS — Z950 Presence of cardiac pacemaker: Secondary | ICD-10-CM

## 2020-04-09 DIAGNOSIS — I4821 Permanent atrial fibrillation: Secondary | ICD-10-CM | POA: Diagnosis not present

## 2020-04-09 DIAGNOSIS — I5181 Takotsubo syndrome: Secondary | ICD-10-CM | POA: Diagnosis not present

## 2020-04-09 DIAGNOSIS — I10 Essential (primary) hypertension: Secondary | ICD-10-CM | POA: Diagnosis not present

## 2020-04-09 NOTE — Patient Instructions (Addendum)
Medication Instructions:  Your physician recommends that you continue on your current medications as directed. Please refer to the Current Medication list given to you today.  *If you need a refill on your cardiac medications before your next appointment, please call your pharmacy*   Lab Work: [place]   If you have labs (blood work) drawn today and your tests are completely normal, you will receive your results only by: Marland Kitchen MyChart Message (if you have MyChart) OR . A paper copy in the mail If you have any lab test that is abnormal or we need to change your treatment, we will call you to review the results.   Testing/Procedures: NONE ORDERED  TODAY     Follow-Up: At [facility], you and your health needs are our priority.  As part of our continuing mission to provide you with exceptional heart care, we have created designated Provider Care Teams.  These Care Teams include your primary Cardiologist (physician) and Advanced Practice Providers (APPs -  Physician Assistants and Nurse Practitioners) who all work together to provide you with the care you need, when you need it.  We recommend signing up for the patient portal called "MyChart".  Sign up information is provided on this After Visit Summary.  MyChart is used to connect with patients for Virtual Visits (Telemedicine).  Patients are able to view lab/test results, encounter notes, upcoming appointments, etc.  Non-urgent messages can be sent to your provider as well.   To learn more about what you can do with MyChart, go to [email or web address].    Your next appointment:   1 year(s)  The format for your next appointment:   In Person  Provider:   You may see [name], MD or one of the following Advanced Practice Providers on your designated Care Team:    Chanetta Marshall, NP  Tommye Standard, PA-C  Legrand Como "Baxter" [place], [place]  Dr Marlou Porch in April f ollow-up in:  IN 7 MONTHS You will receive a reminder letter in  the mail two months in advance. If you don't receive a letter, please call our office to schedule the follow-up appointment.      Other Instructions

## 2020-04-10 DIAGNOSIS — H353221 Exudative age-related macular degeneration, left eye, with active choroidal neovascularization: Secondary | ICD-10-CM | POA: Diagnosis not present

## 2020-04-18 DIAGNOSIS — Z1152 Encounter for screening for COVID-19: Secondary | ICD-10-CM | POA: Diagnosis not present

## 2020-04-18 DIAGNOSIS — J383 Other diseases of vocal cords: Secondary | ICD-10-CM | POA: Diagnosis not present

## 2020-04-22 ENCOUNTER — Telehealth: Payer: Self-pay | Admitting: Internal Medicine

## 2020-04-22 ENCOUNTER — Telehealth: Payer: Self-pay | Admitting: Family Medicine

## 2020-04-22 ENCOUNTER — Other Ambulatory Visit: Payer: Self-pay | Admitting: Family Medicine

## 2020-04-22 DIAGNOSIS — F411 Generalized anxiety disorder: Secondary | ICD-10-CM

## 2020-04-22 MED ORDER — LORAZEPAM 1 MG PO TABS: 1.0000 mg | ORAL_TABLET | Freq: Every evening | ORAL | 0 refills | Status: DC | PRN

## 2020-04-22 NOTE — Telephone Encounter (Signed)
Refill has been sent. But so she is aware this is a controlled substance and if she is taking regularly we need to be seeing her in the office every 3-6 months to continue to refill. That visit has to be with prescribing doctor. Her last visit with me was 07/2019, so please set up follow up before she needs refill on med (90 day supply sent)

## 2020-04-22 NOTE — Telephone Encounter (Signed)
Spoke with the pt, informed her of the message below and scheduled an appt for 11/12 to arrive at 2:15pm.

## 2020-04-22 NOTE — Telephone Encounter (Signed)
Megan Olsen with [facility] is calling to follow up regarding request for most recent office notes. She states their office faxed a request last week. Please return call to discuss at [phone] ext: [phone].

## 2020-04-22 NOTE — Telephone Encounter (Signed)
Pt [facility] a [facility] [address]  Patient has been out of medication for over a week.

## 2020-04-23 NOTE — Telephone Encounter (Signed)
   Pt's niece Seth Bake calling back to follow up, she said she spoke with Janett Billow from medical records and was told she received request from ENT doctor. They said they need to send the last OV notes and ekg to ENT doctor today so they can do the biopsy on Thursday. She wants for Dr. Lequita Halt to call them back

## 2020-04-23 NOTE — Telephone Encounter (Signed)
[facility] records sent to [facility] 04/23/20

## 2020-04-23 NOTE — Telephone Encounter (Signed)
We have received the Request. The  ENT doctors office is aware it can take a day or two. I will put it as a STAT request.

## 2020-04-23 NOTE — Telephone Encounter (Signed)
   Seth Bake called back again. She said the ENT doctor might contact Dr. Caryl Comes. Gave update per notes Janett Billow put a stat on the request. She still wanted to speak with Dr. Caryl Comes.

## 2020-04-25 DIAGNOSIS — I2510 Atherosclerotic heart disease of native coronary artery without angina pectoris: Secondary | ICD-10-CM | POA: Diagnosis not present

## 2020-04-25 DIAGNOSIS — C320 Malignant neoplasm of glottis: Secondary | ICD-10-CM | POA: Diagnosis not present

## 2020-04-25 DIAGNOSIS — Z8673 Personal history of transient ischemic attack (TIA), and cerebral infarction without residual deficits: Secondary | ICD-10-CM | POA: Diagnosis not present

## 2020-04-25 DIAGNOSIS — Z950 Presence of cardiac pacemaker: Secondary | ICD-10-CM | POA: Diagnosis not present

## 2020-04-25 DIAGNOSIS — J383 Other diseases of vocal cords: Secondary | ICD-10-CM | POA: Diagnosis not present

## 2020-04-25 DIAGNOSIS — K219 Gastro-esophageal reflux disease without esophagitis: Secondary | ICD-10-CM | POA: Diagnosis not present

## 2020-04-25 DIAGNOSIS — Z7901 Long term (current) use of anticoagulants: Secondary | ICD-10-CM | POA: Diagnosis not present

## 2020-04-25 DIAGNOSIS — Z880 Allergy status to penicillin: Secondary | ICD-10-CM | POA: Diagnosis not present

## 2020-04-25 DIAGNOSIS — Z79899 Other long term (current) drug therapy: Secondary | ICD-10-CM | POA: Diagnosis not present

## 2020-04-25 DIAGNOSIS — I10 Essential (primary) hypertension: Secondary | ICD-10-CM | POA: Diagnosis not present

## 2020-04-25 DIAGNOSIS — J382 Nodules of vocal cords: Secondary | ICD-10-CM | POA: Diagnosis not present

## 2020-04-26 ENCOUNTER — Other Ambulatory Visit: Payer: Self-pay | Admitting: Family Medicine

## 2020-04-26 DIAGNOSIS — K219 Gastro-esophageal reflux disease without esophagitis: Secondary | ICD-10-CM

## 2020-05-01 ENCOUNTER — Ambulatory Visit (INDEPENDENT_AMBULATORY_CARE_PROVIDER_SITE_OTHER): Payer: Medicare HMO

## 2020-05-01 DIAGNOSIS — I495 Sick sinus syndrome: Secondary | ICD-10-CM

## 2020-05-03 LAB — CUP PACEART REMOTE DEVICE CHECK
Battery Remaining Longevity: 125 mo
Battery Remaining Percentage: 95.5 %
Battery Voltage: 3.01 V
Brady Statistic RV Percent Paced: 98 %
Date Time Interrogation Session: 20211013020023
Implantable Lead Implant Date: 20191009
Implantable Lead Location: 753860
Implantable Lead Model: 1948
Implantable Pulse Generator Implant Date: 20191009
Lead Channel Impedance Value: 640 Ohm
Lead Channel Pacing Threshold Amplitude: 0.75 V
Lead Channel Pacing Threshold Pulse Width: 0.5 ms
Lead Channel Sensing Intrinsic Amplitude: 12 mV
Lead Channel Setting Pacing Amplitude: 2.5 V
Lead Channel Setting Pacing Pulse Width: 0.5 ms
Lead Channel Setting Sensing Sensitivity: 2 mV
Pulse Gen Model: 1272
Pulse Gen Serial Number: 9067558

## 2020-05-06 DIAGNOSIS — J383 Other diseases of vocal cords: Secondary | ICD-10-CM | POA: Diagnosis not present

## 2020-05-06 DIAGNOSIS — C329 Malignant neoplasm of larynx, unspecified: Secondary | ICD-10-CM | POA: Diagnosis not present

## 2020-05-06 DIAGNOSIS — J320 Chronic maxillary sinusitis: Secondary | ICD-10-CM | POA: Diagnosis not present

## 2020-05-06 NOTE — Progress Notes (Signed)
Remote pacemaker transmission.   

## 2020-05-14 DIAGNOSIS — C329 Malignant neoplasm of larynx, unspecified: Secondary | ICD-10-CM | POA: Diagnosis not present

## 2020-05-14 DIAGNOSIS — K047 Periapical abscess without sinus: Secondary | ICD-10-CM | POA: Diagnosis not present

## 2020-05-22 DIAGNOSIS — H353211 Exudative age-related macular degeneration, right eye, with active choroidal neovascularization: Secondary | ICD-10-CM | POA: Diagnosis not present

## 2020-05-23 ENCOUNTER — Other Ambulatory Visit: Payer: Self-pay | Admitting: Family Medicine

## 2020-05-24 ENCOUNTER — Other Ambulatory Visit: Payer: Self-pay | Admitting: Family Medicine

## 2020-05-24 ENCOUNTER — Other Ambulatory Visit: Payer: Self-pay | Admitting: Cardiology

## 2020-05-24 ENCOUNTER — Other Ambulatory Visit: Payer: Self-pay | Admitting: Internal Medicine

## 2020-05-24 DIAGNOSIS — F5101 Primary insomnia: Secondary | ICD-10-CM

## 2020-05-24 NOTE — Telephone Encounter (Signed)
Prescription refill request for Eliquis received.  Last office visit: Megan Olsen, 04/09/2020 Scr: 0.74, 08/09/2019 Age: 86 yo Weight: 56.2 kg   Prescription refill sent.

## 2020-05-29 ENCOUNTER — Ambulatory Visit: Payer: Medicare HMO | Admitting: Family Medicine

## 2020-05-29 DIAGNOSIS — H35411 Lattice degeneration of retina, right eye: Secondary | ICD-10-CM | POA: Diagnosis not present

## 2020-05-29 DIAGNOSIS — H53141 Visual discomfort, right eye: Secondary | ICD-10-CM | POA: Diagnosis not present

## 2020-05-29 DIAGNOSIS — H353221 Exudative age-related macular degeneration, left eye, with active choroidal neovascularization: Secondary | ICD-10-CM | POA: Diagnosis not present

## 2020-05-29 DIAGNOSIS — H33321 Round hole, right eye: Secondary | ICD-10-CM | POA: Diagnosis not present

## 2020-05-31 ENCOUNTER — Other Ambulatory Visit: Payer: Self-pay

## 2020-05-31 ENCOUNTER — Encounter: Payer: Self-pay | Admitting: Family Medicine

## 2020-05-31 ENCOUNTER — Ambulatory Visit (INDEPENDENT_AMBULATORY_CARE_PROVIDER_SITE_OTHER): Payer: Medicare HMO | Admitting: Family Medicine

## 2020-05-31 VITALS — BP 144/70 | HR 65 | Temp 97.6°F | Ht 64.0 in | Wt 123.9 lb

## 2020-05-31 DIAGNOSIS — M816 Localized osteoporosis [Lequesne]: Secondary | ICD-10-CM

## 2020-05-31 DIAGNOSIS — K219 Gastro-esophageal reflux disease without esophagitis: Secondary | ICD-10-CM

## 2020-05-31 DIAGNOSIS — Z23 Encounter for immunization: Secondary | ICD-10-CM | POA: Diagnosis not present

## 2020-05-31 DIAGNOSIS — M8949 Other hypertrophic osteoarthropathy, multiple sites: Secondary | ICD-10-CM | POA: Diagnosis not present

## 2020-05-31 DIAGNOSIS — E538 Deficiency of other specified B group vitamins: Secondary | ICD-10-CM | POA: Diagnosis not present

## 2020-05-31 DIAGNOSIS — M159 Polyosteoarthritis, unspecified: Secondary | ICD-10-CM

## 2020-05-31 DIAGNOSIS — I10 Essential (primary) hypertension: Secondary | ICD-10-CM

## 2020-05-31 DIAGNOSIS — E559 Vitamin D deficiency, unspecified: Secondary | ICD-10-CM

## 2020-05-31 DIAGNOSIS — C320 Malignant neoplasm of glottis: Secondary | ICD-10-CM

## 2020-05-31 MED ORDER — DEXILANT 30 MG PO CPDR: 30.0000 mg | DELAYED_RELEASE_CAPSULE | Freq: Every day | ORAL | 1 refills | Status: DC

## 2020-05-31 NOTE — Progress Notes (Signed)
SHIRLENE ANDAYA DOB: 10-14-1927 Encounter date: 05/31/2020  This is a 86 y.o. female who presents with Chief Complaint  Patient presents with  . Follow-up    History of present illness: No concerns today.   Has arthritis in hands which hurt. Worse with cold weather. Still not sure that tylenol arthritis helps. Bothers her daily.   Follows with Dr. Caryl Comes, cardiology: Permanent atrial fibrillation with pacemaker implantation September/2019 on Eliquis. States doing well from this standpoint.   Had hoarse voice and wasn't getting better. Was given number for Dr. Lucia Gaskins and couldn't get number so went to see Dr. Vevelyn Royals - has cancer vocal cord and has visit on Monday with him to talk through options. 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 No pain. Does get a little strangled. Voice improved after biopsy.   Macular degeneration: Getting regular injections. Seeing Pat[facility] for this. Vision has stayed about the same. Has been stable.   Hypothyroid: Previously on levothyroxine, I do not see this on her list currently.  Will confirm with patient. Put on thyroid medication when she was in rehab and it was continued until she started losing weight.   Hx of CVA without residual effects:back in 2003  Anxiety/depression: thinks she is doing better with this.   B12 def: Has been getting injections for years.  Oral supplement really upset stomach.  Iron def anemia: Has had iron infusions in the past.  Has followed with hematology for this.  Iron supplement was not tolerated.  Insomnia: still has a hard time but doing better because taking lorazepam. Wakes up early, but sleeps through night with this. Goes to bed around 9. Has anxiety. Can't go to sleep without this. Will be awake for days.   Vit D def: Taking oral replacement daily. GERD: Dexilant 60 mg daily. Does have incline on bed and sleeping better with this.  HTN: losartan 25 mg, hydrochlorothiazide 12.5 mg,  bisoprolol 5 mg   Allergies  Allergen Reactions  . Propoxyphene N-Acetaminophen Nausea Only  . Celecoxib Itching and Rash  . Fexofenadine Rash  . Penicillins Hives and Rash    Has patient had a PCN reaction causing immediate rash, facial/tongue/throat swelling, SOB or lightheadedness with hypotension: Yes Has patient had a PCN reaction causing severe rash involving mucus membranes or skin necrosis: No Has patient had a PCN reaction that required hospitalization: No Has patient had a PCN reaction occurring within the last 10 years: No If all of the above answers are "NO", then may proceed with Cephalosporin use.    Current Meds  Medication Sig  . acetaminophen (TYLENOL) 650 MG CR tablet Take 650 mg by mouth every 8 (eight) hours as needed for pain.   . bisoprolol (ZEBETA) 5 MG tablet Take 1 tablet (5 mg total) by mouth daily.  . Carboxymethylcellulose Sodium (THERATEARS) 0.25 % SOLN Place 1 drop into both eyes 3 (three) times daily as needed (for dry/irritated eyes.).  Marland Kitchen Cholecalciferol (VITAMIN D3) 2000 units TABS Take 2,000 Units by mouth daily.  Marland Kitchen ELIQUIS 2.5 MG TABS tablet TAKE 1 TABLET TWICE DAILY  . hydrochlorothiazide (MICROZIDE) 12.5 MG capsule TAKE 1 CAPSULE EVERY DAY  . ipratropium (ATROVENT) 0.03 % nasal spray Place 2 sprays into both nostrils every 12 (twelve) hours.  Marland Kitchen LORazepam (ATIVAN) 1 MG tablet Take 1 tablet (1 mg total) by mouth at bedtime as needed for anxiety or sleep.  Marland Kitchen losartan (COZAAR) 25 MG tablet TAKE 1 TABLET EVERY DAY  . Multiple Vitamins-Minerals (PRESERVISION  AREDS 2) CAPS Take 1 capsule by mouth 2 (two) times daily.  . nitroGLYCERIN (NITROSTAT) 0.4 MG SL tablet Place 1 tablet (0.4 mg total) under the tongue every 5 (five) minutes x 3 doses as needed for chest pain.  . NON FORMULARY Place 1 Dose into both eyes See admin instructions. Receives injections into both eyes every 5-8 weeks at Dr. Serita Grit office [facility])  . traZODone (DESYREL)  50 MG tablet TAKE 1/2 TO 1 TABLET AT BEDTIME AS NEEDED FOR SLEEP  . [DISCONTINUED] DEXILANT 60 MG capsule TAKE 1 CAPSULE BY MOUTH EVERY DAY    Review of Systems  Constitutional: Negative for chills, fatigue and fever.  HENT: Positive for voice change (actually better since biopsy). Negative for congestion, postnasal drip, sore throat and trouble swallowing.   Respiratory: Negative for cough, chest tightness, shortness of breath and wheezing.   Cardiovascular: Negative for chest pain, palpitations and leg swelling.  Gastrointestinal: Negative for abdominal pain, constipation and diarrhea.  Genitourinary: Negative for difficulty urinating.  Neurological: Negative for dizziness, light-headedness and headaches.    Objective:  BP (!) 144/70 (BP Location: Left Arm, Patient Position: Sitting, Cuff Size: Normal)   Pulse 65   Temp 97.6 F (36.4 C) (Oral)   Ht 5\' 4"  (1.626 m)   Wt 123 lb 14.4 oz (56.2 kg)   SpO2 96%   BMI 21.27 kg/m   Weight: 123 lb 14.4 oz (56.2 kg)   BP Readings from Last 3 Encounters:  05/31/20 (!) 144/70  04/09/20 (!) 142/74  03/08/20 118/66   Wt Readings from Last 3 Encounters:  05/31/20 123 lb 14.4 oz (56.2 kg)  04/09/20 124 lb (56.2 kg)  03/08/20 123 lb (55.8 kg)    Physical Exam Constitutional:      General: She is not in acute distress.    Appearance: She is well-developed.  Cardiovascular:     Rate and Rhythm: Normal rate and regular rhythm.     Heart sounds: Normal heart sounds. No murmur heard.  No friction rub.  Pulmonary:     Effort: Pulmonary effort is normal. No respiratory distress.     Breath sounds: Normal breath sounds. No wheezing or rales.  Musculoskeletal:     Right lower leg: No edema.     Left lower leg: No edema.  Neurological:     Mental Status: She is alert and oriented to person, place, and time.  Psychiatric:        Behavior: Behavior normal.     Assessment/Plan  1. Squamous cell carcinoma of vocal cord [facility]) She is  following with Dr. Vevelyn Royals. Has follow up visit on Monday to discuss treatment plan.   2. Benign essential HTN Well controlled. Continue with current medications.  - CBC with Differential/Platelet; Future - Comprehensive metabolic panel; Future - CBC with Differential/Platelet - Comprehensive metabolic panel  3. Gastroesophageal reflux disease, unspecified whether esophagitis present Stable. Continue medication.  - Dexlansoprazole (DEXILANT) 30 MG capsule; Take 1 capsule (30 mg total) by mouth daily.  Dispense: 90 capsule; Refill: 1  4. Primary osteoarthritis involving multiple joints Stable; she has had some improvement with voltaren gel in past; encouraged her to use this again. She does get some mild relief with tylenol.   5. Localized osteoporosis without current pathological fracture Not currently on treatment; will follow up on visit Monday and can discuss with patient pending plan with cancer.  6. Vitamin D deficiency - VITAMIN D 25 Hydroxy (Vit-D Deficiency, Fractures); Future - VITAMIN D 25 Hydroxy (  Vit-D Deficiency, Fractures)  7. B12 deficiency - Vitamin B12; Future - Vitamin B12  8. Need for immunization against influenza - Flu Vaccine QUAD High Dose(Fluad)   Return for pending bloodwork/ specialty note review.     Micheline Rough, MD

## 2020-05-31 NOTE — Patient Instructions (Signed)
I have sent in dexilant 30mg  for you to try. If you feel that acid reflux worsens on this lower dose then please double up on the capsules and let me know so we can return to 60mg  dose. Please ask Dr. Vevelyn Royals as well when you follow up on Monday if he noted any issues with acid reflux on exam. I do think keeping low dose ant-acid on board is reasonable since you are on a blood thinner.

## 2020-06-01 LAB — COMPREHENSIVE METABOLIC PANEL
AG Ratio: 2 (calc) (ref 1.0–2.5)
ALT: 10 U/L (ref 6–29)
AST: 20 U/L (ref 10–35)
Albumin: 4.1 g/dL (ref 3.6–5.1)
Alkaline phosphatase (APISO): 123 U/L (ref 37–153)
BUN: 19 mg/dL (ref 7–25)
CO2: 26 mmol/L (ref 20–32)
Calcium: 9.1 mg/dL (ref 8.6–10.4)
Chloride: 104 mmol/L (ref 98–110)
Creat: 0.78 mg/dL (ref 0.60–0.88)
Globulin: 2.1 g/dL (calc) (ref 1.9–3.7)
Glucose, Bld: 83 mg/dL (ref 65–99)
Potassium: 4.6 mmol/L (ref 3.5–5.3)
Sodium: 141 mmol/L (ref 135–146)
Total Bilirubin: 0.3 mg/dL (ref 0.2–1.2)
Total Protein: 6.2 g/dL (ref 6.1–8.1)

## 2020-06-01 LAB — CBC WITH DIFFERENTIAL/PLATELET
Absolute Monocytes: 546 cells/uL (ref 200–950)
Basophils Absolute: 20 cells/uL (ref 0–200)
Basophils Relative: 0.4 %
Eosinophils Absolute: 179 cells/uL (ref 15–500)
Eosinophils Relative: 3.5 %
HCT: 36.9 % (ref 35.0–45.0)
Hemoglobin: 11.8 g/dL (ref 11.7–15.5)
Lymphs Abs: 668 cells/uL — ABNORMAL LOW (ref 850–3900)
MCH: 29.9 pg (ref 27.0–33.0)
MCHC: 32 g/dL (ref 32.0–36.0)
MCV: 93.4 fL (ref 80.0–100.0)
MPV: 10.2 fL (ref 7.5–12.5)
Monocytes Relative: 10.7 %
Neutro Abs: 3687 cells/uL (ref 1500–7800)
Neutrophils Relative %: 72.3 %
Platelets: 156 10*3/uL (ref 140–400)
RBC: 3.95 10*6/uL (ref 3.80–5.10)
RDW: 11.9 % (ref 11.0–15.0)
Total Lymphocyte: 13.1 %
WBC: 5.1 10*3/uL (ref 3.8–10.8)

## 2020-06-01 LAB — VITAMIN B12: Vitamin B-12: 609 pg/mL (ref 200–1100)

## 2020-06-01 LAB — VITAMIN D 25 HYDROXY (VIT D DEFICIENCY, FRACTURES): Vit D, 25-Hydroxy: 51 ng/mL (ref 30–100)

## 2020-06-03 DIAGNOSIS — C329 Malignant neoplasm of larynx, unspecified: Secondary | ICD-10-CM | POA: Diagnosis not present

## 2020-06-07 NOTE — Progress Notes (Signed)
Spoke with patient.  She did decide not to have any surgery or radiation for laryngeal carcinoma that was recently diagnosed.  She elected to proceed with hospice care.  They evaluated her today and determined that she was at a stage of needing palliative care instead, so we will send someone else out of the house to evaluate her.  She decided not to go through with treatment mostly secondary to macular degeneration and poor site.  This affects her on a regular basis.  We discussed that there may be some tools to help her in the house with reading and seeing better.  I encouraged her to ask the palliative care provider about this when they come out to the house, but otherwise she states she may have some interest in further investigation of this.  May benefit from [facility] and visually impaired.  May be able to offer her some tools to assist with her poor vision.

## 2020-06-11 ENCOUNTER — Telehealth: Payer: Self-pay

## 2020-06-11 ENCOUNTER — Telehealth: Payer: Self-pay | Admitting: Family Medicine

## 2020-06-11 NOTE — Telephone Encounter (Signed)
Spoke with patient and have scheduled an In-person Consult for 06/14/20 @ [place] screening was negative. No pets in home. Patient lives alone. Consent obtained; updated Outlook/Netsmart/Team List and Epic.

## 2020-06-11 NOTE — Telephone Encounter (Signed)
yes

## 2020-06-11 NOTE — Telephone Encounter (Signed)
I left a message for Palliative care letting them know the message below.

## 2020-06-11 NOTE — Telephone Encounter (Signed)
[facility] care Palliative Care Department [phone] option 2  Hospice did not admit the patient for full services but would like to know if Dr. Ethlyn Gallery is okay with Palliative care following the patient.  Please advise

## 2020-06-14 ENCOUNTER — Other Ambulatory Visit: Payer: Self-pay

## 2020-06-14 ENCOUNTER — Other Ambulatory Visit: Payer: Self-pay | Admitting: Nurse Practitioner

## 2020-06-14 DIAGNOSIS — Z515 Encounter for palliative care: Secondary | ICD-10-CM | POA: Diagnosis not present

## 2020-06-14 DIAGNOSIS — C801 Malignant (primary) neoplasm, unspecified: Secondary | ICD-10-CM

## 2020-06-14 NOTE — Progress Notes (Signed)
[facility] Consult Note Telephone: [phone]  Fax: [phone]  PATIENT NAME: Megan Olsen [address] [place] [address] [phone] (home)  DOB: August 22, 1927 MRN: [id number]  PRIMARY CARE PROVIDER:    Caren Macadam, MD,  [place] [address] [phone]  REFERRING PROVIDER:   Caren Macadam, [place],  [place] [address] [phone]  RESPONSIBLE PARTY:   Extended Emergency Contact Information Primary Emergency Contact: Megan Olsen Mobile Phone: [phone] Relation: Niece Secondary Emergency Contact: Megan Olsen States of [place] Phone: [phone] Mobile Phone: [phone] Relation: Megan Olsen (Niece) [phone]  I met face to face with patient and family in home.  ASSESSMENT AND RECOMMENDATIONS:   Advance Care Planning: Today's visit consisted of building trust and discussions on Palliative care medicine as a specialized medical care for people living with serious illness, aimed at facilitating better quality of life through symptoms relief, assisting with advance care planning and establishing goals of care. Patient's niece Megan Olsen and her son present during visit. Patient expressed appreciation for education provided on Palliative care and how it differs from Hospice service.  Goal of care: Patient's goal of care is comfort. Patient does not desire aggressive treatment for any illness. Directives: Patient has a living will. Patient does not want to be resuscitated in the event of a cardiac or respiratory arrest. DNR form signed for patient. The need to complete a MOST form was discussed, patient expressed interest in completing a MOST form. Discussed and reviewed sections of the form in detail, opportunity for questions given, all questions answered. Form completed and signed with patient, signed form given to patient to  keep, copy of MOST and DNR form will be uploaded to [facility] Epic EMR. MOST form details include; Comfort measures, determine use and limitation if antibiotics if infection occurs, no IV fluids, no feeding tube  Symptom Management:  Poor vision: Poor vision related to macular progressing macular degeneration. Patient report needing assistance with grocery shopping and transportation. Will place a referral for Palliative care social worker for assistance with community support such as [place] services for the blind and visually impaired. Insomnia: Patient with report of continued insomnia. Report difficulty falling and staying as asleep at night. Patient on Trazodone $RemoveBefo'25mg'tCkJrQxOxmG$  and Lorazepam $RemoveBefor'1mg'wTVfDtNnjZGS$  as needed at bed time.  Recommendation: Patient order for Trazodone is half to a whole tablet at bedtime, patient takes half tablet. Patient advised to take a whole tablet. Patient may try Melatonin $RemoveBefor'5mg'ktlYEhobYdEC$  gummy 30 minutes to one hour before sleep. Laryngeal cancer: Patient with hoarse voice related to her cancer. She denied pain, denied swallowing issues, denied respiratory difficulty. Provided general support and encouragement, no other unmet needs identified. Palliative care will continue to provide support to patient, family and the medical team.  Follow up Palliative Care Visit: Palliative care will continue to follow for goals of care clarification and symptom management. Return in about 6 weeks or prn.  Family /Caregiver/Community Supports: Patient lives in an apartment by self. She is the only survivng one of her siblings, her nieces are very involved in her care.  Cognitive / Functional decline: Patient is alert and coherent, able to make her own decisions. Patient is visually impaired related to progressing macular degeneration, her near vision is impaired, she however able to see far vision. She is independent in her ADLs, ambulates without assistive device within her home, uses a cane when going out,  no report of falls. She requires assistance in paying her bills and grocery shopping due to poor vision.   I spent 60 minutes providing this consultation, time includes time spent with patient and family, chart review, provider coordination, and documentation. More than 50% of the time in this consultation was spent coordinating communication.   CHIEF COMPLAINT: Initial palliative care consult  HISTORY OF PRESENT ILLNESS:  Megan Olsen is a 86 y.o. year old female with multiple medical problems including Larngeal carcinoma (invasive moderate to poorly differentiated squamous cell carcinoma), macular degeneration, osteoarthritis, permanent atrial fibrillation in 2016, with pacemaker, hx of CVA without residual deficit, vit B12 deficient,Insomina, HTN. Patient declined treatment for her Laryngeal cancer. She was referred for Hospice care, was however deemed ineligible as she is still functional. Palliative Care was asked to follow this patient by consultation request of Caren Macadam, MD to help address advance care planning, establish goals of care and symptoms management.Marland Kitchen   CODE STATUS: DNR  PPS: 60%  HOSPICE ELIGIBILITY/DIAGNOSIS: TBD  PHYSICAL EXAM / ROS:   Current and past weights: 123lbs, Ht 5f 4", BMI 21.27kg/m2 General: NAD, frail appearing, sitting in chair in her living room activity participating in the discussion Cardiovascular: denied chest pain, no edema, Pace maker present left upper chest Pulmonary: no cough, no increased SOB, room air GI: no swallowing issue, appetite fair,denied constipation, continent of bowel GU: denies dysuria, continent of urine MSK:  no joint and ROM abnormalities, ambulatory Skin: no rashes or wounds reported Neurological: Weakness, but otherwise nonfocal  PAST MEDICAL HISTORY:  Past Medical History:  Diagnosis Date  . Age-related macular degeneration, wet, both eyes ([place])   . Anxiety disorder 07/06/2014  . Arthritis    "all over"  (04/27/2018)  . Atrial fibrillation ([place])    catheter ablation of SVT in 2002  . Benign essential HTN 07/06/2014  . Constipation, intermittent 08/04/2007  . CVA (cerebrovascular accident) ([place]) 2003   left brain; denies residual on 04/27/2018  . Depression   . GERD (gastroesophageal reflux disease)   . Heart murmur    hx (04/27/2018)  . Hypothyroidism 09/07/2014   "off RX now" (04/27/2018)  . Iron deficiency anemia   . Iron deficiency anemia, unspecified 01/02/2013  . Osteoarthritis   . Osteoporosis 01/11/2007  . Pneumonia    "one time; years and years ago" (04/27/2018)  . Presence of permanent cardiac pacemaker 04/27/2018    Dual Chamber  . Right BBB/left ant fasc block     SOCIAL HX:  Social History   Tobacco Use  . Smoking status: Never Smoker  . Smokeless tobacco: Never Used  Substance Use Topics  . Alcohol use: Yes    Comment: 04/27/2018 "a beer q 3 months or so; if that"   FAMILY HX:  Family History  Problem Relation Age of Onset  . Early death Mother   . Nephritis Mother   . Kidney disease Mother   . Heart attack Father   . Heart disease Sister   . Dementia Sister   . Heart attack Brother   . Dementia Sister     ALLERGIES:  Allergies  Allergen Reactions  . Propoxyphene N-Acetaminophen Nausea Only  . Celecoxib Itching and Rash  . Fexofenadine Rash  . Penicillins Hives and Rash    Has patient had a PCN reaction causing immediate rash, facial/tongue/throat swelling, SOB or lightheadedness with hypotension: Yes Has patient had a PCN reaction causing severe rash involving mucus membranes or skin necrosis: No Has patient had  a PCN reaction that required hospitalization: No Has patient had a PCN reaction occurring within the last 10 years: No If all of the above answers are "NO", then may proceed with Cephalosporin use.      PERTINENT MEDICATIONS:  Outpatient Encounter Medications as of 06/14/2020  Medication Sig  . acetaminophen (TYLENOL) 650 MG CR tablet Take  650 mg by mouth every 8 (eight) hours as needed for pain.   . bisoprolol (ZEBETA) 5 MG tablet Take 1 tablet (5 mg total) by mouth daily.  . Carboxymethylcellulose Sodium (THERATEARS) 0.25 % SOLN Place 1 drop into both eyes 3 (three) times daily as needed (for dry/irritated eyes.).  Marland Kitchen Cholecalciferol (VITAMIN D3) 2000 units TABS Take 2,000 Units by mouth daily.  Marland Kitchen Dexlansoprazole (DEXILANT) 30 MG capsule Take 1 capsule (30 mg total) by mouth daily.  Marland Kitchen ELIQUIS 2.5 MG TABS tablet TAKE 1 TABLET TWICE DAILY  . hydrochlorothiazide (MICROZIDE) 12.5 MG capsule TAKE 1 CAPSULE EVERY DAY  . ipratropium (ATROVENT) 0.03 % nasal spray Place 2 sprays into both nostrils every 12 (twelve) hours.  Marland Kitchen LORazepam (ATIVAN) 1 MG tablet Take 1 tablet (1 mg total) by mouth at bedtime as needed for anxiety or sleep.  Marland Kitchen losartan (COZAAR) 25 MG tablet TAKE 1 TABLET EVERY DAY  . Multiple Vitamins-Minerals (PRESERVISION AREDS 2) CAPS Take 1 capsule by mouth 2 (two) times daily.  . nitroGLYCERIN (NITROSTAT) 0.4 MG SL tablet Place 1 tablet (0.4 mg total) under the tongue every 5 (five) minutes x 3 doses as needed for chest pain.  . NON FORMULARY Place 1 Dose into both eyes See admin instructions. Receives injections into both eyes every 5-8 weeks at Dr. Serita Grit office [facility])  . traZODone (DESYREL) 50 MG tablet TAKE 1/2 TO 1 TABLET AT BEDTIME AS NEEDED FOR SLEEP   No facility-administered encounter medications on file as of 06/14/2020.     Jari Favre, DNP, AGPCNP-BC

## 2020-06-17 ENCOUNTER — Telehealth: Payer: Self-pay

## 2020-06-17 NOTE — Telephone Encounter (Signed)
Patient calling in wanting her lab results   Please call and advise

## 2020-06-18 NOTE — Telephone Encounter (Signed)
Blood work all looked good. Vitamin levels, kidneys, liver, electrolytes, sugar were all good. Blood counts were a little off; may be related to throat cancer, but this was just subtle. No further lab evaluation recommended.

## 2020-06-18 NOTE — Telephone Encounter (Signed)
I left a detailed message at the pts home number with the results below.

## 2020-06-19 ENCOUNTER — Telehealth: Payer: Self-pay

## 2020-06-19 NOTE — Telephone Encounter (Signed)
(  1:30p)Palliative care SW completed call to patient's niece-Andrea Cook. She has questions regarding services that are available for patient through [facility] ([employer]). SW provided eduction regarding the different programs available through [employer] and the number to call regarding services. Seth Bake stated that she would follow-up with a call to the agency. SW assessed for any additional needs and Seth Bake verbalized no other concerns.

## 2020-07-15 DIAGNOSIS — H353221 Exudative age-related macular degeneration, left eye, with active choroidal neovascularization: Secondary | ICD-10-CM | POA: Diagnosis not present

## 2020-07-26 ENCOUNTER — Other Ambulatory Visit: Payer: Self-pay | Admitting: Family Medicine

## 2020-07-26 DIAGNOSIS — F411 Generalized anxiety disorder: Secondary | ICD-10-CM

## 2020-07-31 ENCOUNTER — Ambulatory Visit (INDEPENDENT_AMBULATORY_CARE_PROVIDER_SITE_OTHER): Payer: Medicare HMO

## 2020-07-31 DIAGNOSIS — I495 Sick sinus syndrome: Secondary | ICD-10-CM

## 2020-07-31 LAB — CUP PACEART REMOTE DEVICE CHECK
Battery Remaining Longevity: 124 mo
Battery Remaining Percentage: 95.5 %
Battery Voltage: 3.01 V
Brady Statistic RV Percent Paced: 97 %
Date Time Interrogation Session: 20220112020013
Implantable Lead Implant Date: 20191009
Implantable Lead Location: 753860
Implantable Lead Model: 1948
Implantable Pulse Generator Implant Date: 20191009
Lead Channel Impedance Value: 630 Ohm
Lead Channel Pacing Threshold Amplitude: 0.75 V
Lead Channel Pacing Threshold Pulse Width: 0.5 ms
Lead Channel Sensing Intrinsic Amplitude: 11.4 mV
Lead Channel Setting Pacing Amplitude: 2.5 V
Lead Channel Setting Pacing Pulse Width: 0.5 ms
Lead Channel Setting Sensing Sensitivity: 2 mV
Pulse Gen Model: 1272
Pulse Gen Serial Number: 9067558

## 2020-08-02 ENCOUNTER — Other Ambulatory Visit: Payer: Self-pay | Admitting: Nurse Practitioner

## 2020-08-02 ENCOUNTER — Other Ambulatory Visit: Payer: Self-pay

## 2020-08-02 DIAGNOSIS — Z515 Encounter for palliative care: Secondary | ICD-10-CM | POA: Diagnosis not present

## 2020-08-02 DIAGNOSIS — H548 Legal blindness, as defined in USA: Secondary | ICD-10-CM

## 2020-08-02 NOTE — Progress Notes (Signed)
[facility] Consult Note Telephone: [phone]  Fax: [phone]  PATIENT NAME: Megan Olsen [address] [place] [address] [phone] (home)  DOB: [birth year] MRN: [id number]  PRIMARY CARE PROVIDER:    Caren Macadam, MD,  [place] [address] [phone]  REFERRING PROVIDER:   Caren Macadam, [place],  [place] [address] [phone]  RESPONSIBLE PARTY:   Extended Emergency Contact Information Primary Emergency Contact: [place] Mobile Phone: [phone] Relation: Niece Secondary Emergency Contact: Carlton Adam [place] of [place] Phone: [phone] Mobile Phone: [phone] Relation: Friend  I met face to face with patient in home.   ASSESSMENT AND RECOMMENDATIONS:   Advance Care Planning: Goal of care: Patient's goal of care is comfort. Patient does not desire aggressive treatment for any illness. Directives: Signed DNR and MOST form in home and on [facility] EMR. Details of MOST form includes; Comfort measures, determine use and limitation if antibiotics if infection occurs, no IV fluids, no feeding tube.  Symptom Management: Insomnia: Patient report ongoing problems with difficulty falling and staying asleep. Report problems occurs intermittently. Current medication regimen includes Trazodone 50mg  and Lorazepam 1mg  at bedtime. Recommendation: Recommend patient start Melatonin 3-5mg  by mouth as needed for sleep.  Hoarseness: Hoarseness related to laryngeal cancer, condition is stable with no change in voice tone since last visit. Report occasional problem with acid reflux and regurgitation. Patient on Dexilant 30mg  daily. She denied pain, cough, SOB, or hemoptysis. Recommendation: Advised to take Tums as needed. May consider increasing dose of Dexilant to 60mg  if symptoms persists. Patient report receiving occasional  welfare check phone call from her oncologist, no visit scheduled. Legal blindness: Phone call made to [facility]. Was referred to Baird Kay, Education officer, museum for the organization for [employer]. Called [place] on [phone], left her a message about patient's need for assistance connecting with support in the community. Left my phone number for her to call me back. Visit update discussed with patient niece, she report also calling social worker more than 5 times without return call.  She voiced no other concerns at this time.  Follow up Palliative Care Visit: Palliative care will continue to follow for goals of care clarification and symptom management. Return in about 4 weeks or prn.  Family /Caregiver/Community Supports: Patient lives alone in an apartment. She has no living sibling, her nieces are very involved in her care. Her Neighbor  Assists her with going out in the community.  Cognitive / Functional decline: Patient is alert and coherent, able to make her own decisions. Patient is visually impaired related to progressing macular degeneration, her near vision is impaired, she however has some far vision. She is independent with her ADLs, ambulates without assistive device within her home, uses a cane when going out, no report of falls. She requires assistance in paying her bills and grocery shopping due to poor vision. Unable to drive or do grocery independently.  I spent 48 minutes providing this consultation, time includes time spent with patient and niece on phone, chart review, provider coordination, and documentation. More than 50% of the time in this consultation was spent counseling and coordinating communication.   CHIEF COMPLAINT: Insomnia  HISTORY OF PRESENT ILLNESS:  Megan Olsen is a 87 y.o. 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 Patient declined treatment for her Laryngeal cancer. She was referred for Hospice care, was however deemed ineligible as she is still functional. Palliative Care was asked to follow this patient by consultation request of Caren Macadam, MD to help address advance care planning, establish goals of care and symptoms management. This is a follow up visit from 06/14/2020.  CODE STATUS: DNR  PPS: 60%  HOSPICE ELIGIBILITY/DIAGNOSIS: TBD  PHYSICAL EXAM / ROS:  General: frail appearing, sitting in chair in her living room activity participating in the discussion, no evidence of acute distress. Cardiovascular: denied chest pain, denied palpitation, no edema, Pace maker present left upper chest Pulmonary: no cough, no SOB, oxygen saturation 97% on room air GI: no swallowing issues reported, appetite fair, denied constipation, continent of bowel GU: denies dysuria, continent of urine MSK:  no joint and ROM abnormalities, ambulatory Skin: no rashes or wounds reported Neurological: Weakness, but otherwise nonfocal Psych: non -anxious affect  PAST MEDICAL HISTORY:  Past Medical History:  Diagnosis Date  . Age-related macular degeneration, wet, both eyes ([place])   . Anxiety disorder 07/06/2014  . Arthritis    "all over" (04/27/2018)  . Atrial fibrillation ([place])    catheter ablation of SVT in 2002  . Benign essential HTN 07/06/2014  . Constipation, intermittent 08/04/2007  . CVA (cerebrovascular accident) ([place]) 2003   left brain; denies residual on 04/27/2018  . Depression   . GERD (gastroesophageal reflux disease)   . Heart murmur    hx (04/27/2018)  . Hypothyroidism 09/07/2014   "off RX now" (04/27/2018)  . Iron deficiency anemia   . Iron deficiency anemia, unspecified 01/02/2013  . Osteoarthritis   . Osteoporosis 01/11/2007  .  Pneumonia    "one time; years and years ago" (04/27/2018)  . Presence of permanent cardiac pacemaker 04/27/2018    Dual Chamber  . Right BBB/left ant fasc block     SOCIAL HX:  Social History   Tobacco Use  . Smoking status: Never Smoker  . Smokeless tobacco: Never Used  Substance Use Topics  . Alcohol use: Yes    Comment: 04/27/2018 "a beer q 3 months or so; if that"   FAMILY HX:  Family History  Problem Relation Age of Onset  . Early death Mother   . Nephritis Mother   . Kidney disease Mother   . Heart attack Father   . Heart disease Sister   . Dementia Sister   . Heart attack Brother   . Dementia Sister     ALLERGIES:  Allergies  Allergen Reactions  . Propoxyphene N-Acetaminophen Nausea Only  . Celecoxib Itching and Rash  . Fexofenadine Rash  . Penicillins Hives and Rash    Has patient had a PCN reaction causing immediate rash, facial/tongue/throat swelling, SOB or lightheadedness with hypotension: Yes Has patient had a PCN reaction causing severe rash involving mucus membranes or skin necrosis: No Has patient had a PCN reaction that required hospitalization: No Has patient had a PCN reaction occurring within the last 10 years: No If all of the above answers are "NO", then may proceed with Cephalosporin use.      PERTINENT MEDICATIONS:  Outpatient Encounter Medications as of 08/02/2020  Medication Sig  . acetaminophen (TYLENOL) 650 MG CR tablet Take 650 mg by mouth every 8 (eight) hours as needed for pain.   . bisoprolol (ZEBETA) 5 MG tablet Take 1 tablet (5  mg total) by mouth daily.  . Carboxymethylcellulose Sodium (THERATEARS) 0.25 % SOLN Place 1 drop into both eyes 3 (three) times daily as needed (for dry/irritated eyes.).  Marland Kitchen Cholecalciferol (VITAMIN D3) 2000 units TABS Take 2,000 Units by mouth daily.  Marland Kitchen Dexlansoprazole (DEXILANT) 30 MG capsule Take 1 capsule (30 mg total) by mouth daily.  Marland Kitchen ELIQUIS 2.5 MG TABS tablet TAKE 1 TABLET TWICE DAILY  .  hydrochlorothiazide (MICROZIDE) 12.5 MG capsule TAKE 1 CAPSULE EVERY DAY  . ipratropium (ATROVENT) 0.03 % nasal spray Place 2 sprays into both nostrils every 12 (twelve) hours.  Marland Kitchen LORazepam (ATIVAN) 1 MG tablet TAKE 1 TABLET BY MOUTH AT BEDTIME AS NEEDED FOR ANXIETY OR SLEEP  . losartan (COZAAR) 25 MG tablet TAKE 1 TABLET EVERY DAY  . Multiple Vitamins-Minerals (PRESERVISION AREDS 2) CAPS Take 1 capsule by mouth 2 (two) times daily.  . nitroGLYCERIN (NITROSTAT) 0.4 MG SL tablet Place 1 tablet (0.4 mg total) under the tongue every 5 (five) minutes x 3 doses as needed for chest pain.  . NON FORMULARY Place 1 Dose into both eyes See admin instructions. Receives injections into both eyes every 5-8 weeks at Dr. Serita Grit office [facility])  . traZODone (DESYREL) 50 MG tablet TAKE 1/2 TO 1 TABLET AT BEDTIME AS NEEDED FOR SLEEP   No facility-administered encounter medications on file as of 08/02/2020.    Thank you for the opportunity to participate in the care of Ms. Ottie Glazier.The palliative care team will continue to follow. Please call our office at [phone] if we can be of additional assistance.  [place], DNP, AGPCNP-BC

## 2020-08-07 ENCOUNTER — Telehealth: Payer: Self-pay | Admitting: Nurse Practitioner

## 2020-08-13 NOTE — Progress Notes (Signed)
Remote pacemaker transmission.   

## 2020-08-21 DIAGNOSIS — H353211 Exudative age-related macular degeneration, right eye, with active choroidal neovascularization: Secondary | ICD-10-CM | POA: Diagnosis not present

## 2020-09-09 DIAGNOSIS — H353221 Exudative age-related macular degeneration, left eye, with active choroidal neovascularization: Secondary | ICD-10-CM | POA: Diagnosis not present

## 2020-09-11 ENCOUNTER — Other Ambulatory Visit: Payer: Self-pay | Admitting: Nurse Practitioner

## 2020-09-11 ENCOUNTER — Other Ambulatory Visit: Payer: Self-pay

## 2020-09-11 DIAGNOSIS — F329 Major depressive disorder, single episode, unspecified: Secondary | ICD-10-CM

## 2020-09-11 DIAGNOSIS — Z515 Encounter for palliative care: Secondary | ICD-10-CM

## 2020-09-11 NOTE — Progress Notes (Signed)
[facility] Consult Note Telephone: [phone]  Fax: [phone]  PATIENT NAME: Megan Olsen [address] [place] [address] [phone] (home)  DOB: Mar 25, 1928 MRN: [id number]  PRIMARY CARE PROVIDER:    Caren Macadam, MD,  [place] [address] [phone]  REFERRING PROVIDER:   Caren Macadam, [place],  [place] [address] [phone]  RESPONSIBLE PARTY:   Extended Emergency Contact Information Primary Emergency Contact: [place] Mobile Phone: [phone] Relation: Niece Secondary Emergency Contact: Carlton Adam [place] Phone: [phone] Mobile Phone: [phone] Relation: Friend   ASSESSMENT AND RECOMMENDATIONS:   Advance Care Planning: I met face to face with patient in home, her neighbor [name] present at visit. Patient's goal of care is comfort. She reiterated desire to not have aggressive treatment for any illness. She desires to stay in her home for as long as possible. Signed DNR and MOST form in home and [facility] Epic EMR. Detail of MOST form includes;  Comfort measures, determine use or limitation of antibiotics when infection occurs, no IV fluids, no feeding tube.  Symptom Management:  Depressed mood: Patient report ongoing insomnia, report waking up at about 2:30am today and could not go back to sleep. She report this is ongoing and worsened in the last month. She report being concerned about her medical condition and living arrangement. She report her apartment complex has a new management and her rent cost will increase. She expressed concern about being on fixed income and ability to afford her living expenses. She report being concerned about her medical condition, she feels like she does not know the status of her cancer if it has progressed as it has being about 6 months since her diagnosis and she is not  having any symptoms suggestive of progression of her cancer. She also expressed concern about going completely blind and how that would affect her ability to live independently. She however reiterated that she just want to know the status of her cancer, that she is not changing her mind about not wanting treatment. Patient report adding Melatonin $RemoveBeforeDE'5mg'OGnGjYMHBkNGUiu$  to her regimen without improvement. She is currently on Trazodone $RemoveBefo'50mg'woXOdkVBZXr$  and Lorazepam $RemoveBefor'1mg'moNKVZlynTBv$  daily at bedtime. She report not taking naps during the day. She endorsed love for reading and wished she had a device that she could listen to e-books with.  Recommendation: Continue current medication regimen, consider increasing Trazodone to $RemoveBefo'100mg'YtMHnrsWxht$  to help with her depression. Patient report having up coming appointment with her PCP in two weeks, she was encouraged to discuss the need to follow up with her oncologist for reassessment. Phone call made to Baird Kay social worker to follow up on getting community support for patient. Shared patient's phone number with Reuben Likes. Reuben Likes would work on getting patient some assistive device so she can listen to e-books. Discussed patient's case with palliateive Education officer, museum, patient may benefit from counseling or [place] visit. Provided general support and encouragement, no other unmet needs identified at this time.  Follow up Palliative Care Visit: Palliative care will continue to follow for complex decision making and symptom management. Return in about 4 weeks or prn.  Family /Caregiver/Community Supports: Patient lives alone in her appartment. She has no living sibling or biological children, her nieces are very involved in her care. Her [place] assists her when going out in the community.  Cognitive / Functional decline: Patient is alert and coherent, able to make  her own decisions. She is visually impaired related to progressing macular degeneration, her near vision is impaired, she however has some far vision. She  requires assistance in paying her bills and grocery shopping due to poor vision.Unable to drive or do grocery independently.She is independent with her ADLs, ambulates without assistive device within her home, uses a cane when out in the community, no report of falls.   I spent 48 minutes providing this consultation, time includes time spent with patient, chart review, provider coordination, and documentation. More than 50% of the time in this consultation was spent counseling and coordinating communication.   CHIEF COMPLAINT: follow up on insomnia  History obtained from review of EMR, discussion with patient.  Records reviewed and summarized bellow.  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 Patient declined treatment for her Laryngeal cancer. She was referred for Hospice care, was howeverdeemed ineligible as she did not meet the criteria for Hospice. Palliative Care was asked to follow this patient to help address advance care planning, establishgoals of careand symptoms management. This is a follow up visit from 08/03/2019.  CODE STATUS: DNR  PPS: 60%  HOSPICE ELIGIBILITY/DIAGNOSIS: TBD  ROS   General: denied fever, denied chills, denied fatigue EYES: Endorsed poor vision ENMT: denies dysphagia Cardiovascular: denied chest pain, denied palpitation Pulmonary: denied cough, denied increased SOB Abdomen: endorse good appetite, denied constipation, denied incontinence of bowel GU: denied dysuria, denied incontinence of urine MSK: endorses ROM limitations, no falls reported Skin: denies rashes or wounds Neurological:  denies pain, denies  insomnia Psych: Endorsed occasional depressed mood Heme/lymph/immuno: denies bruises, abnormal bleeding   Physical Exam: Constitutional: NAD General: frail appearing, thin EYES: anicteric sclera, lids intact, no discharge  ENMT: intact hearing, oral mucous membranes moist CV:  no LE edema Pulmonary: no increased work of breathing, no cough, no audible wheezes, room air Abdomen: no ascites GU: deferred MSK: moderate sarcopenia, decreased ROM in all extremities, no contractures of LE, ambulatory Skin: warm and dry, no rashes or wounds on visible skin Neuro: Generalized weakness, no focal deficit Psych: non-anxious affect today, A and O x 3 Hem/lymph/immuno: no widespread bruising   PAST MEDICAL HISTORY:  Past Medical History:  Diagnosis Date  . Age-related macular degeneration, wet, both eyes ([place])   . Anxiety disorder 07/06/2014  . Arthritis    "all over" (04/27/2018)  . Atrial fibrillation ([place])    catheter ablation of SVT in 2002  . Benign essential HTN 07/06/2014  . Constipation, intermittent 08/04/2007  . CVA (cerebrovascular accident) ([place]) 2003   left brain; denies residual on 04/27/2018  . Depression   . GERD (gastroesophageal reflux disease)   . Heart murmur    hx (04/27/2018)  . Hypothyroidism 09/07/2014   "off RX now" (04/27/2018)  . Iron deficiency anemia   . Iron deficiency anemia, unspecified 01/02/2013  . Osteoarthritis   . Osteoporosis 01/11/2007  . Pneumonia    "one time; years and years ago" (04/27/2018)  . Presence of permanent cardiac pacemaker 04/27/2018    Dual Chamber  . Right BBB/left ant fasc block     SOCIAL HX:  Social History   Tobacco Use  . Smoking status: Never Smoker  . Smokeless tobacco: Never Used  Substance Use Topics  . Alcohol use: Yes  Comment: 04/27/2018 "a beer q 3 months or so; if that"   FAMILY HX:  Family History  Problem Relation Age of Onset  . Early death Mother   . Nephritis Mother   . Kidney disease Mother   . Heart  attack Father   . Heart disease Sister   . Dementia Sister   . Heart attack Brother   . Dementia Sister     ALLERGIES:  Allergies  Allergen Reactions  . Propoxyphene N-Acetaminophen Nausea Only  . Celecoxib Itching and Rash  . Fexofenadine Rash  . Penicillins Hives and Rash    Has patient had a PCN reaction causing immediate rash, facial/tongue/throat swelling, SOB or lightheadedness with hypotension: Yes Has patient had a PCN reaction causing severe rash involving mucus membranes or skin necrosis: No Has patient had a PCN reaction that required hospitalization: No Has patient had a PCN reaction occurring within the last 10 years: No If all of the above answers are "NO", then may proceed with Cephalosporin use.      PERTINENT MEDICATIONS:  Outpatient Encounter Medications as of 09/11/2020  Medication Sig  . acetaminophen (TYLENOL) 650 MG CR tablet Take 650 mg by mouth every 8 (eight) hours as needed for pain.   . bisoprolol (ZEBETA) 5 MG tablet Take 1 tablet (5 mg total) by mouth daily.  . Carboxymethylcellulose Sodium (THERATEARS) 0.25 % SOLN Place 1 drop into both eyes 3 (three) times daily as needed (for dry/irritated eyes.).  Marland Kitchen Cholecalciferol (VITAMIN D3) 2000 units TABS Take 2,000 Units by mouth daily.  Marland Kitchen Dexlansoprazole (DEXILANT) 30 MG capsule Take 1 capsule (30 mg total) by mouth daily.  Marland Kitchen ELIQUIS 2.5 MG TABS tablet TAKE 1 TABLET TWICE DAILY  . hydrochlorothiazide (MICROZIDE) 12.5 MG capsule TAKE 1 CAPSULE EVERY DAY  . ipratropium (ATROVENT) 0.03 % nasal spray Place 2 sprays into both nostrils every 12 (twelve) hours.  Marland Kitchen LORazepam (ATIVAN) 1 MG tablet TAKE 1 TABLET BY MOUTH AT BEDTIME AS NEEDED FOR ANXIETY OR SLEEP  . losartan (COZAAR) 25 MG tablet TAKE 1 TABLET EVERY DAY  . Multiple Vitamins-Minerals (PRESERVISION AREDS 2) CAPS Take 1 capsule by mouth 2 (two) times daily.  . nitroGLYCERIN (NITROSTAT) 0.4 MG SL tablet Place 1 tablet (0.4 mg total) under the tongue every 5  (five) minutes x 3 doses as needed for chest pain.  . NON FORMULARY Place 1 Dose into both eyes See admin instructions. Receives injections into both eyes every 5-8 weeks at Dr. Serita Grit office [facility])  . traZODone (DESYREL) 50 MG tablet TAKE 1/2 TO 1 TABLET AT BEDTIME AS NEEDED FOR SLEEP   No facility-administered encounter medications on file as of 09/11/2020.    Thank you for the opportunity to participate in the care of Ms. Ottie Glazier. The palliative care team will continue to follow. Please call our office at [phone] if we can be of additional assistance.  Jari Favre , DNP, AGPCNP-BC

## 2020-09-19 ENCOUNTER — Other Ambulatory Visit: Payer: Self-pay

## 2020-09-19 DIAGNOSIS — Z515 Encounter for palliative care: Secondary | ICD-10-CM

## 2020-09-20 NOTE — Progress Notes (Signed)
COMMUNITY PALLIATIVE CARE SW NOTE  PATIENT NAME: Megan Olsen DOB: 1927-12-13 MRN: [id number]  PRIMARY CARE PROVIDER: Caren Macadam, MD  RESPONSIBLE PARTY:  Acct ID - Guarantor Home Phone Work Phone Relationship Acct Type  [id number] - Betts,[phone]  Self P/F     [place], [place] [address]     PLAN OF CARE and INTERVENTIONS:             1. GOALS OF CARE/ ADVANCE CARE PLANNING:  Goal is for patient to remain in her home. Patient has a DNR and MOST form.  2. SOCIAL/EMOTIONAL/SPIRITUAL ASSESSMENT/ INTERVENTIONS:  Per request of palliative NP-Q.Mbemena, SW completed a visit with patient at her home. SW completed introductions, obtained a brief social history and current status of patient, while assessing psychosocial needs. Patient expressed that she is worried about having to rearrange her apartment for renovations as she is partially blind and some financial concerns. Her niece serves as her PCG and they have talked and concluded that she will be able to manage the changes. Patient would like to also know more about her cancer status through a scan. She wanted to be sure that her oncologist or PCP is aware of her request-primary palliative care NP was updated for follow-up.  SW provided active listening, supportive counseling and reassurance of support to patient. SW also provided her the tape collection of the Bible that she could play at any time as this was a requested desire of hers. She expressed her appreciation for it. . Patient is also expecting another tape recorder and clock from [employer] that would be helpful to quality of life and ability to remain independent. Patient is open to support by Education officer, museum.  3. PATIENT/CAREGIVER EDUCATION/ COPING:  Patient seems to be coping well and realistic about her overall medical issues and limitations. 4. PERSONAL EMERGENCY PLAN:  911 can abe activated for emergencies. Patient is a  long-time resident of her community and has daily contact with family, friends and neighbors.  5. COMMUNITY RESOURCES COORDINATION/ HEALTH CARE NAVIGATION:  None. 6. FINANCIAL/LEGAL CONCERNS/INTERVENTIONS:  Patient has has some financial concerns, but has since been resolved.     SOCIAL HX:  Social History   Tobacco Use  . Smoking status: Never Smoker  . Smokeless tobacco: Never Used  Substance Use Topics  . Alcohol use: Yes    Comment: 04/27/2018 "a beer q 3 months or so; if that"    CODE STATUS: DNR ADVANCED DIRECTIVES: Yes MOST FORM COMPLETE:  No HOSPICE EDUCATION PROVIDED: No  PPS: Patient is legally blind, but is able to complete ADL's and live independently.  Duration of visit and documentation: 60 minutes.      [address] [place], [place]

## 2020-09-25 NOTE — Progress Notes (Addendum)
Subjective:   Megan Olsen is a 87 y.o. female who presents for Medicare Annual (Subsequent) preventive examination.  Review of Systems    N/A  Cardiac Risk Factors include: advanced age (>24men, >56 women);hypertension     Objective:    Today's Vitals   09/26/20 1402 09/26/20 1411  BP: (!) 160/84   Pulse: 65   Temp: (!) 97.5 F (36.4 C)   TempSrc: Oral   SpO2: 99%   Weight: 125 lb (56.7 kg)   Height: 5\' 4"  (1.626 m)   PainSc:  8    Body mass index is 21.46 kg/m.  Advanced Directives 09/26/2020 01/30/2019 05/25/2018 04/27/2018 05/06/2017 02/23/2017 02/23/2017  Does Patient Have a Medical Advance Directive? Yes No Yes No Yes Yes Yes  Type of Paramedic of [place];Living will - Living will - - [place];Living will [place];Living will  Does patient want to make changes to medical advance directive? No - Patient declined - No - Patient declined - - Yes (Inpatient - patient requests chaplain consult to change a medical advance directive) No - Patient declined  Copy of [place] in Chart? No - copy requested - - - - No - copy requested No - copy requested  Would patient like information on creating a medical advance directive? - No - Patient declined - No - Patient declined - - -    Current Medications (verified) Outpatient Encounter Medications as of 09/26/2020  Medication Sig  . acetaminophen (TYLENOL) 650 MG CR tablet Take 650 mg by mouth every 8 (eight) hours as needed for pain.   . bisoprolol (ZEBETA) 5 MG tablet Take 1 tablet (5 mg total) by mouth daily.  . Carboxymethylcellulose Sodium 0.25 % SOLN Place 1 drop into both eyes 3 (three) times daily as needed (for dry/irritated eyes.).  Marland Kitchen Cholecalciferol (VITAMIN D3) 2000 units TABS Take 2,000 Units by mouth daily.  Marland Kitchen Dexlansoprazole (DEXILANT) 30 MG capsule Take 1 capsule (30 mg total) by mouth daily.  Marland Kitchen ELIQUIS 2.5 MG TABS tablet TAKE 1 TABLET  TWICE DAILY  . hydrochlorothiazide (MICROZIDE) 12.5 MG capsule TAKE 1 CAPSULE EVERY DAY  . LORazepam (ATIVAN) 1 MG tablet TAKE 1 TABLET BY MOUTH AT BEDTIME AS NEEDED FOR ANXIETY OR SLEEP  . losartan (COZAAR) 25 MG tablet TAKE 1 TABLET EVERY DAY  . Multiple Vitamins-Minerals (PRESERVISION AREDS 2) CAPS Take 1 capsule by mouth 2 (two) times daily.  . NON FORMULARY Place 1 Dose into both eyes See admin instructions. Receives injections into both eyes every 5-8 weeks at Dr. Serita Grit office [facility])  . traZODone (DESYREL) 50 MG tablet TAKE 1/2 TO 1 TABLET AT BEDTIME AS NEEDED FOR SLEEP  . ipratropium (ATROVENT) 0.03 % nasal spray Place 2 sprays into both nostrils every 12 (twelve) hours. (Patient not taking: Reported on 09/26/2020)  . nitroGLYCERIN (NITROSTAT) 0.4 MG SL tablet Place 1 tablet (0.4 mg total) under the tongue every 5 (five) minutes x 3 doses as needed for chest pain. (Patient not taking: Reported on 09/26/2020)   No facility-administered encounter medications on file as of 09/26/2020.    Allergies (verified) Propoxyphene n-acetaminophen, Celecoxib, Fexofenadine, and Penicillins   History: Past Medical History:  Diagnosis Date  . Age-related macular degeneration, wet, both eyes ([place])   . Anxiety disorder 07/06/2014  . Arthritis    "all over" (04/27/2018)  . Atrial fibrillation ([place])    catheter ablation of SVT in 2002  . Benign essential  HTN 07/06/2014  . Constipation, intermittent 08/04/2007  . CVA (cerebrovascular accident) ([place]) 2003   left brain; denies residual on 04/27/2018  . Depression   . GERD (gastroesophageal reflux disease)   . Heart murmur    hx (04/27/2018)  . Hypothyroidism 09/07/2014   "off RX now" (04/27/2018)  . Iron deficiency anemia   . Iron deficiency anemia, unspecified 01/02/2013  . Osteoarthritis   . Osteoporosis 01/11/2007  . Pneumonia    "one time; years and years ago" (04/27/2018)  . Presence of permanent cardiac pacemaker 04/27/2018     Dual Chamber  . Right BBB/left ant fasc block    Past Surgical History:  Procedure Laterality Date  . APPENDECTOMY    . CATARACT EXTRACTION W/ INTRAOCULAR LENS  IMPLANT, BILATERAL Bilateral   . CHOLECYSTECTOMY OPEN    . COLONOSCOPY    . EYE SURGERY    . FRACTURE SURGERY    . HIP PINNING,CANNULATED Left 07/07/2014   Procedure: CANNULATED HIP PINNING;  Surgeon: Mauri Pole, MD;  Location: [facility];  Service: Orthopedics;  Laterality: Left;  . INSERT / REPLACE / REMOVE PACEMAKER  04/27/2018    Dual Chamber  . JOINT REPLACEMENT    . LAPAROSCOPIC OVARIAN CYSTECTOMY    . LEFT HEART CATH AND CORONARY ANGIOGRAPHY N/A 01/31/2019   Procedure: LEFT HEART CATH AND CORONARY ANGIOGRAPHY;  Surgeon: Leonie Man, MD;  Location: [place] [facility];  Service: Cardiovascular;  Laterality: N/A;  . NASAL SEPTUM SURGERY    . PACEMAKER IMPLANT N/A 04/27/2018   Procedure: PACEMAKER IMPLANT - Dual Chamber;  Surgeon: Deboraha Sprang, MD;  Location: [place] [facility];  Service: Cardiovascular;  Laterality: N/A;  . SVT ABLATION  2002  . TONSILLECTOMY    . TOTAL ABDOMINAL HYSTERECTOMY    . TOTAL HIP ARTHROPLASTY Right 02/23/2017   Procedure: RIGHT TOTAL HIP ARTHROPLASTY ANTERIOR APPROACH;  Surgeon: Paralee Cancel, MD;  Location: [facility];  Service: Orthopedics;  Laterality: Right;  . VITRECTOMY Right 2008   dr Zadie Rhine.  Vitrectomy and removal of tissue.    Family History  Problem Relation Age of Onset  . Early death Mother   . Nephritis Mother   . Kidney disease Mother   . Heart attack Father   . Heart disease Sister   . Dementia Sister   . Heart attack Brother   . Dementia Sister    Social History   Socioeconomic History  . Marital status: Single    Spouse name: Not on file  . Number of children: Not on file  . Years of education: Not on file  . Highest education level: Not on file  Occupational History  . Not on file  Tobacco Use  . Smoking status: Never Smoker  . Smokeless tobacco: Never  Used  Vaping Use  . Vaping Use: Never used  Substance and Sexual Activity  . Alcohol use: Yes    Comment: 04/27/2018 "a beer q 3 months or so; if that"  . Drug use: Never  . Sexual activity: Not Currently  Other Topics Concern  . Not on file  Social History Narrative  . Not on file   Social Determinants of Health   Financial Resource Strain: Low Risk   . Difficulty of Paying Living Expenses: Not hard at all  Food Insecurity: No Food Insecurity  . Worried About Charity fundraiser in the Last Year: Never true  . Ran Out of Food in the Last Year: Never true  Transportation Needs:  No Transportation Needs  . Lack of Transportation (Medical): No  . Lack of Transportation (Non-Medical): No  Physical Activity: Insufficiently Active  . Days of Exercise per Week: 7 days  . Minutes of Exercise per Session: 10 min  Stress: No Stress Concern Present  . Feeling of Stress : Not at all  Social Connections: Moderately Isolated  . Frequency of Communication with Friends and Family: More than three times a week  . Frequency of Social Gatherings with Friends and Family: More than three times a week  . Attends Religious Services: Never  . Active Member of Clubs or Organizations: Yes  . Attends Archivist Meetings: Never  . Marital Status: Never married    Tobacco Counseling Counseling given: Not Answered   Clinical Intake:  Pre-visit preparation completed: Yes  Pain : 0-10 Pain Score: 8  Pain Type: Chronic pain Pain Location: Hand Pain Orientation: Right,Left Pain Descriptors / Indicators: Aching Pain Onset: More than a month ago Pain Frequency: Intermittent Pain Relieving Factors: Tylenol arthritis  Pain Relieving Factors: Tylenol arthritis  Nutritional Risks: Nausea/ vomitting/ diarrhea (diarrhea one week ago due to something she ate) Diabetes: No  How often do you need to have someone help you when you read instructions, pamphlets, or other written materials from  your doctor or pharmacy?: 5 - Always (due to macular degeneration)  Diabetic?No  Interpreter Needed?: No  Information entered by :: [place] of Daily Living In your present state of health, do you have any difficulty performing the following activities: 09/26/2020  Hearing? Y  Vision? Y  Difficulty concentrating or making decisions? N  Walking or climbing stairs? N  Dressing or bathing? N  Doing errands, shopping? Y  Preparing Food and eating ? N  Using the Toilet? N  In the past six months, have you accidently leaked urine? N  Do you have problems with loss of bowel control? N  Managing your Medications? N  Managing your Finances? N  Housekeeping or managing your Housekeeping? N  Some recent data might be hidden    Patient Care Team: Caren Macadam, MD as PCP - General (Family Medicine) Jerline Pain, MD as PCP - Cardiology (Cardiology) Deboraha Sprang, MD as PCP - Electrophysiology (Cardiology)  Indicate any recent Medical Services you may have received from other than [facility] providers in the past year (date may be approximate).     Assessment:   This is a routine wellness examination for [place].  Hearing/Vision screen  Hearing Screening   125Hz  250Hz  500Hz  1000Hz  2000Hz  3000Hz  4000Hz  6000Hz  8000Hz   Right ear:           Left ear:           Vision Screening Comments: Gets eye exams every 5 weeks. Has macular degeneration   Dietary issues and exercise activities discussed: Current Exercise Habits: Home exercise routine, Type of exercise: strength training/weights, Time (Minutes): 10, Frequency (Times/Week): 7, Weekly Exercise (Minutes/Week): 70, Intensity: Mild, Exercise limited by: None identified  Goals    . Exercise 150 minutes per week (moderate activity)     Can start going to the [facility] which is near you Will make a plan to get our more when her hip is better     . Patient Stated     I would like to maintain my current health  status.      Depression Screen PHQ 2/9 Scores 09/26/2020 09/12/2019 08/22/2018 05/25/2018 01/26/2018 05/06/2017 08/28/2015  PHQ - 2 Score  0 0 1 0 0 1 0  PHQ- 9 Score 2 - 3 3 0 - -    Fall Risk Fall Risk  09/26/2020 09/12/2019 08/22/2018 05/25/2018 01/26/2018  Falls in the past year? 0 0 0 1 No  Number falls in past yr: 0 0 0 0 -  Injury with Fall? 0 0 0 0 -  Risk Factor Category  - - - - -  Risk for fall due to : Impaired balance/gait - - - -  Follow up Falls evaluation completed;Falls prevention discussed - - - -    FALL RISK PREVENTION PERTAINING TO THE HOME:  Any stairs in or around the home? No  If so, are there any without handrails? No  Home free of loose throw rugs in walkways, pet beds, electrical cords, etc? Yes  Adequate lighting in your home to reduce risk of falls? Yes   ASSISTIVE DEVICES UTILIZED TO PREVENT FALLS:  Life alert? Yes  Use of a cane, walker or w/c? Yes  Grab bars in the bathroom? Yes  Shower chair or bench in shower? Yes  Elevated toilet seat or a handicapped toilet? Yes   TIMED UP AND GO:  Was the test performed? Yes .  Length of time to ambulate 10 feet: 6 sec.   Gait slow and steady without use of assistive device  Cognitive Function:   Normal cognitive status assessed by direct observation by this Nurse Health Advisor. No abnormalities found.     6CIT Screen 05/25/2018  What Year? 4 points  What month? 0 points  What time? 0 points  Count back from 20 4 points  Months in reverse 0 points  Repeat phrase 10 points  Total Score 18    Immunizations Immunization History  Administered Date(s) Administered  . Fluad Quad(high Dose 65+) 05/12/2019, 05/31/2020  . Influenza Split 04/24/2011, 05/02/2012  . Influenza Whole 04/19/1998, 06/03/2007, 05/08/2008, 04/03/2010  . Influenza, High Dose Seasonal PF 05/31/2015, 05/21/2016, 04/14/2017, 04/28/2018  . Influenza,inj,Quad PF,6+ Mos 04/10/2013, 03/30/2014  . PFIZER(Purple Top)SARS-COV-2 Vaccination  05/25/2020  . Pneumococcal Conjugate-13 05/31/2015  . Pneumococcal Polysaccharide-23 07/20/1996, 05/02/2012  . Tdap 05/02/2012  . Zoster 04/03/2010  . Zoster Recombinat (Shingrix) 09/18/2019    TDAP status: Up to date  Flu Vaccine status: Up to date  Pneumococcal vaccine status: Up to date  Covid-19 vaccine status: Completed vaccines  Qualifies for Shingles Vaccine? Yes   Zostavax completed Yes   Shingrix Completed?: Yes  Screening Tests Health Maintenance  Topic Date Due  . COVID-19 Vaccine (2 - Pfizer risk 4-dose series) 06/15/2020  . TETANUS/TDAP  05/02/2022  . INFLUENZA VACCINE  Completed  . DEXA SCAN  Completed  . PNA vac Low Risk Adult  Completed  . HPV VACCINES  Aged Out    Health Maintenance  Health Maintenance Due  Topic Date Due  . COVID-19 Vaccine (2 - Pfizer risk 4-dose series) 06/15/2020    Colorectal cancer screening: No longer required.   Mammogram status: No longer required due to age.  Bone Density status: Completed 11/05/2014. Results reflect: Bone density results: OSTEOPENIA. Repeat every 2 years.  Lung Cancer Screening: (Low Dose CT Chest recommended if Age 50-80 years, 30 pack-year currently smoking OR have quit w/in 15years.) does not qualify.   Lung Cancer Screening Referral: N/A   Additional Screening:  Hepatitis C Screening: does not qualify;   Vision Screening: Recommended annual ophthalmology exams for early detection of glaucoma and other disorders of the eye. Is the patient up to  date with their annual eye exam?  Yes  Who is the provider or what is the name of the office in which the patient attends annual eye exams? Dr. Posey Pronto If pt is not established with a provider, would they like to be referred to a provider to establish care? No .   Dental Screening: Recommended annual dental exams for proper oral hygiene  Community Resource Referral / Chronic Care Management: CRR required this visit?  No   CCM required this visit?  No       Plan:     I have personally reviewed and noted the following in the patient's chart:   . Medical and social history . Use of alcohol, tobacco or illicit drugs  . Current medications and supplements . Functional ability and status . Nutritional status . Physical activity . Advanced directives . List of other physicians . Hospitalizations, surgeries, and ER visits in previous 12 months . Vitals . Screenings to include cognitive, depression, and falls . Referrals and appointments  In addition, I have reviewed and discussed with patient certain preventive protocols, quality metrics, and best practice recommendations. A written personalized care plan for preventive services as well as general preventive health recommendations were provided to patient.     Ofilia Neas, LPN   [id number]   Nurse Notes: None

## 2020-09-26 ENCOUNTER — Telehealth: Payer: Self-pay | Admitting: Family Medicine

## 2020-09-26 ENCOUNTER — Ambulatory Visit (INDEPENDENT_AMBULATORY_CARE_PROVIDER_SITE_OTHER): Payer: Medicare HMO

## 2020-09-26 ENCOUNTER — Other Ambulatory Visit: Payer: Self-pay

## 2020-09-26 VITALS — BP 160/84 | HR 65 | Temp 97.5°F | Ht 64.0 in | Wt 125.0 lb

## 2020-09-26 DIAGNOSIS — Z0000 Encounter for general adult medical examination without abnormal findings: Secondary | ICD-10-CM

## 2020-09-26 NOTE — Telephone Encounter (Signed)
Patient came in today for AWV with me and stated that she has been having some difficulties sleeping the past few day even despite the fact that she is taking Trazadone. She states that takes 1/2 tablet  to 1 whole  tablet depending on the night but is still having trouble falling asleep and staying asleep. She would like to know what else you recommend and if you think it is ok for her to try melatonin. Please advise?

## 2020-09-26 NOTE — Patient Instructions (Signed)
Megan Olsen , Thank you for taking time to come for your Medicare Wellness Visit. I appreciate your ongoing commitment to your health goals. Please review the following plan we discussed and let me know if I can assist you in the future.   Screening recommendations/referrals: Colonoscopy: No longer required  Mammogram: No longer required Bone Density: No longer required  Recommended yearly ophthalmology/optometry visit for glaucoma screening and checkup Recommended yearly dental visit for hygiene and checkup  Vaccinations: Influenza vaccine: Up to date, next due fall 2022  Pneumococcal vaccine: Completed series  Tdap vaccine: Up to date, next due 05/02/2022 Shingles vaccine: Completed series    Advanced directives: Copies on file   Conditions/risks identified: None  Next appointment: 10/23/2020 @ 11:30 am with Dr. Ethlyn Gallery    Preventive Care 65 Years and Older, Female Preventive care refers to lifestyle choices and visits with your health care provider that can promote health and wellness. What does preventive care include?  A yearly physical exam. This is also called an annual well check.  Dental exams once or twice a year.  Routine eye exams. Ask your health care provider how often you should have your eyes checked.  Personal lifestyle choices, including:  Daily care of your teeth and gums.  Regular physical activity.  Eating a healthy diet.  Avoiding tobacco and drug use.  Limiting alcohol use.  Practicing safe sex.  Taking low-dose aspirin every day.  Taking vitamin and mineral supplements as recommended by your health care provider. What happens during an annual well check? The services and screenings done by your health care provider during your annual well check will depend on your age, overall health, lifestyle risk factors, and family history of disease. Counseling  Your health care provider may ask you questions about your:  Alcohol use.  Tobacco  use.  Drug use.  Emotional well-being.  Home and relationship well-being.  Sexual activity.  Eating habits.  History of falls.  Memory and ability to understand (cognition).  Work and work Statistician.  Reproductive health. Screening  You may have the following tests or measurements:  Height, weight, and BMI.  Blood pressure.  Lipid and cholesterol levels. These may be checked every 5 years, or more frequently if you are over 41 years old.  Skin check.  Lung cancer screening. You may have this screening every year starting at age 85 if you have a 30-pack-year history of smoking and currently smoke or have quit within the past 15 years.  Fecal occult blood test (FOBT) of the stool. You may have this test every year starting at age 67.  Flexible sigmoidoscopy or colonoscopy. You may have a sigmoidoscopy every 5 years or a colonoscopy every 10 years starting at age 7.  Hepatitis C blood test.  Hepatitis B blood test.  Sexually transmitted disease (STD) testing.  Diabetes screening. This is done by checking your blood sugar (glucose) after you have not eaten for a while (fasting). You may have this done every 1-3 years.  Bone density scan. This is done to screen for osteoporosis. You may have this done starting at age 3.  Mammogram. This may be done every 1-2 years. Talk to your health care provider about how often you should have regular mammograms. Talk with your health care provider about your test results, treatment options, and if necessary, the need for more tests. Vaccines  Your health care provider may recommend certain vaccines, such as:  Influenza vaccine. This is recommended every year.  Tetanus,  diphtheria, and acellular pertussis (Tdap, Td) vaccine. You may need a Td booster every 10 years.  Zoster vaccine. You may need this after age 18.  Pneumococcal 13-valent conjugate (PCV13) vaccine. One dose is recommended after age 56.  Pneumococcal  polysaccharide (PPSV23) vaccine. One dose is recommended after age 36. Talk to your health care provider about which screenings and vaccines you need and how often you need them. This information is not intended to replace advice given to you by your health care provider. Make sure you discuss any questions you have with your health care provider. Document Released: 08/02/2015 Document Revised: 03/25/2016 Document Reviewed: 05/07/2015 Elsevier Interactive Patient Education  2017 [employer] Prevention in the Home Falls can cause injuries. They can happen to people of all ages. There are many things you can do to make your home safe and to help prevent falls. What can I do on the outside of my home?  Regularly fix the edges of walkways and driveways and fix any cracks.  Remove anything that might make you trip as you walk through a door, such as a raised step or threshold.  Trim any bushes or trees on the path to your home.  Use bright outdoor lighting.  Clear any walking paths of anything that might make someone trip, such as rocks or tools.  Regularly check to see if handrails are loose or broken. Make sure that both sides of any steps have handrails.  Any raised decks and porches should have guardrails on the edges.  Have any leaves, snow, or ice cleared regularly.  Use sand or salt on walking paths during winter.  Clean up any spills in your garage right away. This includes oil or grease spills. What can I do in the bathroom?  Use night lights.  Install grab bars by the toilet and in the tub and shower. Do not use towel bars as grab bars.  Use non-skid mats or decals in the tub or shower.  If you need to sit down in the shower, use a plastic, non-slip stool.  Keep the floor dry. Clean up any water that spills on the floor as soon as it happens.  Remove soap buildup in the tub or shower regularly.  Attach bath mats securely with double-sided non-slip rug  tape.  Do not have throw rugs and other things on the floor that can make you trip. What can I do in the bedroom?  Use night lights.  Make sure that you have a light by your bed that is easy to reach.  Do not use any sheets or blankets that are too big for your bed. They should not hang down onto the floor.  Have a firm chair that has side arms. You can use this for support while you get dressed.  Do not have throw rugs and other things on the floor that can make you trip. What can I do in the kitchen?  Clean up any spills right away.  Avoid walking on wet floors.  Keep items that you use a lot in easy-to-reach places.  If you need to reach something above you, use a strong step stool that has a grab bar.  Keep electrical cords out of the way.  Do not use floor polish or wax that makes floors slippery. If you must use wax, use non-skid floor wax.  Do not have throw rugs and other things on the floor that can make you trip. What can I do with  my stairs?  Do not leave any items on the stairs.  Make sure that there are handrails on both sides of the stairs and use them. Fix handrails that are broken or loose. Make sure that handrails are as long as the stairways.  Check any carpeting to make sure that it is firmly attached to the stairs. Fix any carpet that is loose or worn.  Avoid having throw rugs at the top or bottom of the stairs. If you do have throw rugs, attach them to the floor with carpet tape.  Make sure that you have a light switch at the top of the stairs and the bottom of the stairs. If you do not have them, ask someone to add them for you. What else can I do to help prevent falls?  Wear shoes that:  Do not have high heels.  Have rubber bottoms.  Are comfortable and fit you well.  Are closed at the toe. Do not wear sandals.  If you use a stepladder:  Make sure that it is fully opened. Do not climb a closed stepladder.  Make sure that both sides of the  stepladder are locked into place.  Ask someone to hold it for you, if possible.  Clearly mark and make sure that you can see:  Any grab bars or handrails.  First and last steps.  Where the edge of each step is.  Use tools that help you move around (mobility aids) if they are needed. These include:  Canes.  Walkers.  Scooters.  Crutches.  Turn on the lights when you go into a dark area. Replace any light bulbs as soon as they burn out.  Set up your furniture so you have a clear path. Avoid moving your furniture around.  If any of your floors are uneven, fix them.  If there are any pets around you, be aware of where they are.  Review your medicines with your doctor. Some medicines can make you feel dizzy. This can increase your chance of falling. Ask your doctor what other things that you can do to help prevent falls. This information is not intended to replace advice given to you by your health care provider. Make sure you discuss any questions you have with your health care provider. Document Released: 05/02/2009 Document Revised: 12/12/2015 Document Reviewed: 08/10/2014 Elsevier Interactive Patient Education  2017 [employer].

## 2020-09-27 NOTE — Telephone Encounter (Signed)
Spoke with patient and informed her as below per Dr. Ethlyn Gallery. Patient verbalized understanding

## 2020-09-27 NOTE — Telephone Encounter (Signed)
She can definitely try melatonin; and it would even be ok for her to try this with her trazodone. If that doesn't help let me know.

## 2020-10-15 ENCOUNTER — Other Ambulatory Visit: Payer: Self-pay

## 2020-10-15 ENCOUNTER — Other Ambulatory Visit: Payer: Self-pay | Admitting: Nurse Practitioner

## 2020-10-15 DIAGNOSIS — Z515 Encounter for palliative care: Secondary | ICD-10-CM | POA: Diagnosis not present

## 2020-10-15 DIAGNOSIS — J302 Other seasonal allergic rhinitis: Secondary | ICD-10-CM

## 2020-10-15 NOTE — Progress Notes (Signed)
[facility] Consult Note Telephone: [phone]  Fax: [phone]  PATIENT NAME: Megan Olsen [address] [place] [address] [phone] (home)  DOB: 06-19-1928 MRN: [id number]  PRIMARY CARE PROVIDER:    Caren Macadam, MD,  [place] [address] [phone]  REFERRING PROVIDER:   Caren Macadam, [place],  [place] [address] [phone]  RESPONSIBLE PARTY:   Extended Emergency Contact Information Primary Emergency Contact: [place] Mobile Phone: [phone] Relation: Niece Secondary Emergency Contact: Carlton Adam [place] Phone: [phone] Mobile Phone: [phone] Relation: Friend  I met face to face with patient in home, her neighbor [name] present at visit.  ASSESSMENT AND RECOMMENDATIONS:   Advance Care Planning: Goal of care: Patient's goal of care is comfort, she does not desire aggressive treatment for any Illness. She however would want intervention if she falls and sustain a fracture. Directives: Patient reiterated desire to not be resuscitated in the event of cardiac or respiratory arrest. Signed DNR and MOST form in home, copy on [facility] EMR. Details of MOST form include; Comfort measures, determine use and limitation if antibiotics if infection occurs, no IV fluids, no feeding tube.   Symptom Management:  Allergic Rhinitis: No report of fever, no chills, no SOB, no cough. Recommendation: Recommend over the counter Loratadine 10mg  by mouth daily as needed. Patient report Cetirizine makes her jumpy. Nose bleed: no other occurrence, denied hx of nose bleed, no other report of bleeding. Of note patient is on Eliquis for Afib. Patient to notify PCP if reoccurrence. Patient has [place] appointment with her PCP on 10/23/2020. She denied chest pain, denied palpitation. Arthritis: condition is stable. Patient  denied acute change in function or uncontrolled pain. Report adequate pain control withTylenol as needed. Laryngeal cancer: Patient stable without report of hoarseness or dysphagia. Patient verbalized desire to know the status of her cancer as she is not having the symptoms that she was told she would have when diagnosed over 6 months ago. Patient asked if there is a possibility of re-imaging to evaluate her disease progression, advised to discuss her concerns with her PCP at her next visit on 10/23/2020. Macular degeneration: Patient is legally blind. Report receiving reading materials and tapes from social worker from palliative care. Expressed appreciation for the support received.  Questions and concerns were addressed. Patient was encouraged to call with questions and/or concerns. Provided general support and encouragement, no other unmet needs identified.   Follow up Palliative Care Visit: Palliative care will continue to follow for complex decision making and symptom management. Return in 6 weeks or prn.  Family /Caregiver/Community Supports: Patient lives in an apartment alone. Receives support for family and friends.  Cognitive / Functional decline: Patient is alert and coherent, able to make her own decisions. Patient is visually impaired related to progression of macular degeneration, her near vision is impaired, she however able to see far vision. She is independent in her ADLs, ambulates without assistive device within her home, uses a cane when going out, no report of falls. Continues to require assistance in paying her bills and grocery shopping due to poor vision.   I spent 30 minutes providing this consultation. More than 50% of the time in this consultation was spent counseling and coordinating communication.   CHIEF COMPLAINT: watery itchy eyes  History obtained from review of EMR and discussion with patient.Records reviewed and summarized bellow.  HISTORY  OF PRESENT ILLNESS:   Megan Olsen is a 87 y.o. 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 Patient declined treatment for her Laryngeal cancer. Was evaluated for Hospice care and deemed ineligible as she is still functional. Patient report having watery and itchy eyes, report nose bleed that occurred once 2 days ago. Denied any other acute bleed. Denied headache, denied dizziness, denied facial pain. Palliative Care was asked to help address advance care planning, establish goals of care and symptoms management.  CODE STATUS: DNR  PPS: 60%  HOSPICE ELIGIBILITY/DIAGNOSIS: TBD  Labs 05/31/2020 Hgb 11.8, Hct 36.9, K 4.6, Na 141, Ca 9.1, Vit B12 609  Physical Exam: Vital signs: BP 168/70, P71, RR 16, 99% on room air Current and past weights: 125lbs up from 123lbs five months ago, Ht 33f 4", BMI 21.46kg/m2 General: frail appearing, cooperative, sitting in chair in NAD, actively participating in discussion EYES: anicteric sclera, lids intact, watery eyes  ENMT: intact hearing, oral mucous membranes moist CV:  no LE edema Pulmonary: no increased work of breathing, no cough, no audible wheezes, room air Abdomen:  no ascites GU: deferred ERX:VQMGQ all extremities, no contractures, ambulatory Skin: warm and dry, no rashes or wounds on visible skin Neuro: A and O x 3 Psych: non-anxious affect today Hem/lymph/immuno: no widespread bruising   PAST MEDICAL HISTORY:  Past Medical History:  Diagnosis Date  . Age-related macular degeneration, wet, both eyes ([facility])   . Anxiety disorder 07/06/2014  . Arthritis    "all over" (04/27/2018)  . Atrial fibrillation ([facility])    catheter ablation of SVT in 2002  . Benign essential HTN 07/06/2014  . Constipation,  intermittent 08/04/2007  . CVA (cerebrovascular accident) ([facility]) 2003   left brain; denies residual on 04/27/2018  . Depression   . GERD (gastroesophageal reflux disease)   . Heart murmur    hx (04/27/2018)  . Hypothyroidism 09/07/2014   "off RX now" (04/27/2018)  . Iron deficiency anemia   . Iron deficiency anemia, unspecified 01/02/2013  . Osteoarthritis   . Osteoporosis 01/11/2007  . Pneumonia    "one time; years and years ago" (04/27/2018)  . Presence of permanent cardiac pacemaker 04/27/2018    Dual Chamber  . Right BBB/left ant fasc block     SOCIAL HX:  Social History   Tobacco Use  . Smoking status: Never Smoker  . Smokeless tobacco: Never Used  Substance Use Topics  . Alcohol use: Yes    Comment: 04/27/2018 "a beer q 3 months or so; if that"   FAMILY HX:  Family History  Problem Relation Age of Onset  . Early death Mother   . Nephritis Mother   . Kidney disease Mother   . Heart attack Father   . Heart disease Sister   . Dementia Sister   . Heart attack Brother   . Dementia Sister     ALLERGIES:  Allergies  Allergen Reactions  . Propoxyphene N-Acetaminophen Nausea Only  . Celecoxib Itching and Rash  . Fexofenadine Rash  . Penicillins Hives and Rash    Has patient had a PCN reaction causing immediate rash, facial/tongue/throat swelling, SOB or lightheadedness with hypotension: Yes Has patient had a PCN reaction causing severe rash involving mucus membranes or skin necrosis: No Has patient had a PCN reaction that required hospitalization: No  Has patient had a PCN reaction occurring within the last 10 years: No If all of the above answers are "NO", then may proceed with Cephalosporin use.      PERTINENT MEDICATIONS:  Outpatient Encounter Medications as of 10/15/2020  Medication Sig  . acetaminophen (TYLENOL) 650 MG CR tablet Take 650 mg by mouth every 8 (eight) hours as needed for pain.   . bisoprolol (ZEBETA) 5 MG tablet Take 1 tablet (5 mg total) by mouth  daily.  . Carboxymethylcellulose Sodium 0.25 % SOLN Place 1 drop into both eyes 3 (three) times daily as needed (for dry/irritated eyes.).  Marland Kitchen Cholecalciferol (VITAMIN D3) 2000 units TABS Take 2,000 Units by mouth daily.  Marland Kitchen Dexlansoprazole (DEXILANT) 30 MG capsule Take 1 capsule (30 mg total) by mouth daily.  Marland Kitchen ELIQUIS 2.5 MG TABS tablet TAKE 1 TABLET TWICE DAILY  . hydrochlorothiazide (MICROZIDE) 12.5 MG capsule TAKE 1 CAPSULE EVERY DAY  . ipratropium (ATROVENT) 0.03 % nasal spray Place 2 sprays into both nostrils every 12 (twelve) hours. (Patient not taking: Reported on 09/26/2020)  . LORazepam (ATIVAN) 1 MG tablet TAKE 1 TABLET BY MOUTH AT BEDTIME AS NEEDED FOR ANXIETY OR SLEEP  . losartan (COZAAR) 25 MG tablet TAKE 1 TABLET EVERY DAY  . Multiple Vitamins-Minerals (PRESERVISION AREDS 2) CAPS Take 1 capsule by mouth 2 (two) times daily.  . nitroGLYCERIN (NITROSTAT) 0.4 MG SL tablet Place 1 tablet (0.4 mg total) under the tongue every 5 (five) minutes x 3 doses as needed for chest pain. (Patient not taking: Reported on 09/26/2020)  . NON FORMULARY Place 1 Dose into both eyes See admin instructions. Receives injections into both eyes every 5-8 weeks at Dr. Serita Grit office [facility])  . traZODone (DESYREL) 50 MG tablet TAKE 1/2 TO 1 TABLET AT BEDTIME AS NEEDED FOR SLEEP   No facility-administered encounter medications on file as of 10/15/2020.    Thank you for the opportunity to participate in the care of Ms. Ottie Glazier. The palliative care team will continue to follow. Please call our office at [phone] if we can be of additional assistance.  Jari Favre, DNP, AGPCNP-BC

## 2020-10-18 ENCOUNTER — Other Ambulatory Visit: Payer: Self-pay | Admitting: Family Medicine

## 2020-10-23 ENCOUNTER — Encounter: Payer: Self-pay | Admitting: Family Medicine

## 2020-10-23 ENCOUNTER — Other Ambulatory Visit: Payer: Self-pay

## 2020-10-23 ENCOUNTER — Telehealth (INDEPENDENT_AMBULATORY_CARE_PROVIDER_SITE_OTHER): Payer: Medicare HMO | Admitting: Family Medicine

## 2020-10-23 DIAGNOSIS — F5101 Primary insomnia: Secondary | ICD-10-CM

## 2020-10-23 DIAGNOSIS — C320 Malignant neoplasm of glottis: Secondary | ICD-10-CM | POA: Diagnosis not present

## 2020-10-23 DIAGNOSIS — F411 Generalized anxiety disorder: Secondary | ICD-10-CM

## 2020-10-23 DIAGNOSIS — K219 Gastro-esophageal reflux disease without esophagitis: Secondary | ICD-10-CM

## 2020-10-23 MED ORDER — DEXLANSOPRAZOLE 60 MG PO CPDR: 60.0000 mg | DELAYED_RELEASE_CAPSULE | Freq: Every day | ORAL | 3 refills | Status: DC

## 2020-10-23 MED ORDER — TRAZODONE HCL 100 MG PO TABS: 100.0000 mg | ORAL_TABLET | Freq: Every day | ORAL | 3 refills | Status: DC

## 2020-10-23 MED ORDER — LORAZEPAM 1 MG PO TABS: ORAL_TABLET | ORAL | 5 refills | Status: DC

## 2020-10-23 NOTE — Progress Notes (Signed)
Virtual Visit via Telephone Note  I connected with Megan Olsen on 10/23/20 at 11:30 AM EDT by telephone and verified that I am speaking with the correct person using two identifiers.   I discussed the limitations, risks, security and privacy concerns of performing an evaluation and management service by telephone and the availability of in person appointments. I also discussed with the patient that there may be a patient responsible charge related to this service. The patient expressed understanding and agreed to proceed.  Location patient: home Location provider:  [facility]  [place], [place] [address]  Participants present for the call: patient, provider Patient did not have a visit in the prior 7 days to address this/these issue(s).   History of Present Illness: Still having reflux issue, which is bothering her. She hasn't had any more symptoms with anything from vocal cord.                    She is taking the dexilant still. Bothers her at any point through day. Liquid, sour comes up into mouth. If she can drink something like water it will calm it down. No trouble with swallowing, but does have to take time to swallow. Has had a couple of things get stuck. She does take tums, but doesn't help as much.   Not sleeping that well overall. Has had issues for awhile; trazodone helps some, but not a lot. She is up to 50mg  qhs. Also taking ativan. Helps her relax some, but doesn't seem to help much with sleep and not with staying asleep.  Takes the dexilant early in the morning. Doesn't note significant help with taking this.                                     Observations/Objective: Patient sounds cheerful and well on the phone. I do not appreciate any SOB. Speech and thought processing are grossly intact. Patient reported vitals:  Assessment and Plan: 1. Squamous cell carcinoma of vocal cord [facility]) She has elected to not treat this surgically or  medically. She is under hospice care.  2. Generalized anxiety disorder She does have some general anxiety. Not terrible and not sure why she is anxious because she feels at peace with things generally. We discussed working on sleep first (see below) but if not improving may consider anxiety treatment with ssri/etc to help control worry. - LORazepam (ATIVAN) 1 MG tablet; TAKE 1 TABLET BY MOUTH AT BEDTIME AS NEEDED FOR ANXIETY OR SLEEP  Dispense: 30 tablet; Refill: 5  3. Gastroesophageal reflux disease, unspecified whether esophagitis present We had decreased her dexilant dose but are going to increase back to 60mg  since she is not well controlled. Would consider adding in pepcid for a month if still having symptoms.  4. Primary insomnia Increase trazodone to 100mg  qhs. Let me know if this does not help. See above.    Follow Up Instructions:  Follow up in 3 months time for CCV; but let me know if acid reflux and sleep are not improving with changes above.    99441 5-10 [id number] 11-20 9443 21-30 I did not refer this patient for an OV in the next 24 hours for this/these issue(s).  I discussed the assessment and treatment plan with the patient. The patient was provided an opportunity to ask questions and all were answered. The patient agreed with  the plan and demonstrated an understanding of the instructions.   The patient was advised to call back or seek an in-person evaluation if the symptoms worsen or if the condition fails to improve as anticipated.  I provided 22 minutes of non-face-to-face time during this encounter.   Micheline Rough, MD

## 2020-10-24 ENCOUNTER — Telehealth: Payer: Self-pay | Admitting: *Deleted

## 2020-10-24 NOTE — Telephone Encounter (Signed)
-----   Message from Caren Macadam, MD sent at 10/23/2020 12:28 PM EDT ----- Please set up follow up visit in 3 months.

## 2020-10-24 NOTE — Telephone Encounter (Signed)
Left a detailed message at the pts home number to schedule an appt as below.

## 2020-10-26 ENCOUNTER — Other Ambulatory Visit: Payer: Self-pay | Admitting: Cardiology

## 2020-10-28 NOTE — Telephone Encounter (Signed)
Age 87, weight 56kg, SCr 0.78 on 05/31/20 afib indication, last visit Sept 2021

## 2020-10-30 ENCOUNTER — Ambulatory Visit (INDEPENDENT_AMBULATORY_CARE_PROVIDER_SITE_OTHER): Payer: Medicare HMO

## 2020-10-30 DIAGNOSIS — I495 Sick sinus syndrome: Secondary | ICD-10-CM | POA: Diagnosis not present

## 2020-10-31 LAB — CUP PACEART REMOTE DEVICE CHECK
Battery Remaining Longevity: 125 mo
Battery Remaining Percentage: 95.5 %
Battery Voltage: 3.01 V
Brady Statistic RV Percent Paced: 97 %
Date Time Interrogation Session: 20220413020013
Implantable Lead Implant Date: 20191009
Implantable Lead Location: 753860
Implantable Lead Model: 1948
Implantable Pulse Generator Implant Date: 20191009
Lead Channel Impedance Value: 640 Ohm
Lead Channel Pacing Threshold Amplitude: 0.75 V
Lead Channel Pacing Threshold Pulse Width: 0.5 ms
Lead Channel Sensing Intrinsic Amplitude: 12 mV
Lead Channel Setting Pacing Amplitude: 2.5 V
Lead Channel Setting Pacing Pulse Width: 0.5 ms
Lead Channel Setting Sensing Sensitivity: 2 mV
Pulse Gen Model: 1272
Pulse Gen Serial Number: 9067558

## 2020-11-04 DIAGNOSIS — H353221 Exudative age-related macular degeneration, left eye, with active choroidal neovascularization: Secondary | ICD-10-CM | POA: Diagnosis not present

## 2020-11-14 NOTE — Progress Notes (Signed)
Remote pacemaker transmission.   

## 2020-11-20 DIAGNOSIS — H353211 Exudative age-related macular degeneration, right eye, with active choroidal neovascularization: Secondary | ICD-10-CM | POA: Diagnosis not present

## 2020-12-06 ENCOUNTER — Other Ambulatory Visit: Payer: Self-pay | Admitting: Nurse Practitioner

## 2020-12-06 ENCOUNTER — Other Ambulatory Visit: Payer: Self-pay

## 2020-12-06 VITALS — BP 162/84 | HR 72 | Resp 16

## 2020-12-06 DIAGNOSIS — Z515 Encounter for palliative care: Secondary | ICD-10-CM | POA: Diagnosis not present

## 2020-12-06 DIAGNOSIS — G4700 Insomnia, unspecified: Secondary | ICD-10-CM

## 2020-12-06 DIAGNOSIS — I10 Essential (primary) hypertension: Secondary | ICD-10-CM

## 2020-12-06 DIAGNOSIS — J302 Other seasonal allergic rhinitis: Secondary | ICD-10-CM

## 2020-12-06 MED ORDER — CETIRIZINE HCL 10 MG PO TABS: 5.0000 mg | ORAL_TABLET | Freq: Every day | ORAL | 0 refills | Status: DC

## 2020-12-06 NOTE — Progress Notes (Signed)
[facility] Consult Note Telephone: [phone]  Fax: [phone]    Date of encounter: 12/06/20 PATIENT NAME: Megan Olsen [address] [place] [address]   [phone] (home)  DOB: 12-22-1927 MRN: [id number]  PRIMARY CARE PROVIDER:    Caren Macadam, MD,  Megan Olsen [address] [phone]  REFERRING PROVIDER:   Caren Olsen, Megan Olsen,  [place] [address] [phone]  RESPONSIBLE PARTY:    Contact Information    Name Relation Home Work Mobile   Megan Olsen Niece   [phone]   Megan Payor [phone]  [phone]     I met face to face with patient in home.  Palliative Care was asked to follow this patient by consultation request of  Megan Macadam, MD to address advance care planning and complex medical decision making. This is a follow up visit.                                 ASSESSMENT AND PLAN / RECOMMENDATIONS:   Advance Care Planning/Goals of Care:  CODE STATUS: DNR Goal of care: Patient's goal of care is comfort. Directives: Signed DNR and MOST forms present in home, copy on [facility] EMR. Details of MOST form include; Comfort measures, determine use and limitation if antibiotics if infection occurs, no IV fluids, no feeding tube.   Symptom Management/Plan: Allergic Rhinitis:  Prescription for Cetirizine 22m by mouth daily as needed sent to her pharmacy. Patient's apartment complex is under renovation, patient advised to wear mask when outside her apartment. Encouraged to avoid exposure to plant pollens and animals. Elevated blood pressure:  Blood pressure noted to be elevated in the last 2 visits. Patient currently on Losartan 274mand Hydrochlorothiazide 12.78m9maily. Patient lives alone and unable to self monitor blood pressure in home due to poor vision. We discussed importance of reducing her blood pressure while  also considering risk of hypotension. We discussed life style changes to help reduce blood pressure such as eating healthy meal choices to include fruits and vegetables, discussed eating plan diet known as the Dietary Approaches to Stop Hypertension (DASH) diet. Advised to limit salt intake, limit caffeine and alcohol and reduction of stress. If no improvement in blood pressure, consider increasing Losartan to 20m70mily. Insomnia: Report sleep better in the last month. Continue current regimen with Trazodone 100mg18m Lorazepam 1mg a66meeded at bedtime. Patient's apartment complex is currently under renovation. Patient requesting for her apartment not be renovated in order to maintain her current routine due to her poor vision. She was advised that palliative care would provide documentation to support her need if needed to corroborate her concerns to the management of her apartment complex.  Provided general support and encouragement.Questions and concerns were addressed. Patient was encouraged to call with questions and/or concerns.  Follow up Palliative Care Visit: Palliative care will continue to follow for complex medical decision making, advance care planning, and clarification of goals. Return in about 6 weeks or prn.  PPS: 60%   HOSPICE ELIGIBILITY/DIAGNOSIS: TBD  CHIEF COMPLAIN: watery itch eyes, cough from post nasal drip  History obtained from review of Epic EMR and discussion with 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 Patient declined treatment for her Laryngeal cancer. Was evaluated for Hospice  care and deemed ineligible as she was still  functional.  Patient with report of watery itchy eyes. Patient had the same complain at last visit about 6 weeks ago. It was recommended that she start taking Loratadine 58m daily. Patient report not taking Loratadine. She report having cough with feeling of post nasal drip. She report sneezing with ocassional headaches relieved by PRN Tylenol. She denied nose bleed, denied dizziness, denied facial pain. Ten systems reviewed and are negative for acute change, except as noted in the HPI.   I reviewed available labs, medications, imaging, studies and related documents from the EMR. Records reviewed and summarized above.   Today's Vitals   12/06/20 1614  BP: (!) 162/84  Pulse: 72  Resp: 16  SpO2: 98%  PainSc: 0-No pain   There is no height or weight on file to calculate BMI.  Physical Exam: General: frail appearing, cooperative, sitting in chair in NAD, actively participating in discussion EYES: anicteric sclera, watery eyes, legally blind ENMT: intact hearing, oral mucous membranes moist CV: no LE edema Pulmonary: no increased work of breathing, no cough, no audible wheezes, room air Abdomen:  no ascites GU: deferred MSK: moves all extremities, no contractures, ambulatory Skin: warm and dry, no rashes or wounds on visible skin Neuro: A and O x 3 Psych: non-anxious affecttoday Hem/lymph/immuno: no widespread bruising    Thank you for the opportunity to participate in the care of Ms. KBeggs  The palliative care team will continue to follow. Please call our office at [phone] we can be of additional assistance.   QAlba Destine NP DNP,AGPCNP-BC  COVID-19 PATIENT SCREENING TOOL Asked and negative response unless otherwise noted:   Have you had symptoms of covid, tested positive or been in contact with someone with symptoms/positive test in the past 5-10 days?

## 2020-12-11 DIAGNOSIS — H353221 Exudative age-related macular degeneration, left eye, with active choroidal neovascularization: Secondary | ICD-10-CM | POA: Diagnosis not present

## 2021-01-02 ENCOUNTER — Other Ambulatory Visit: Payer: Self-pay | Admitting: Family Medicine

## 2021-01-23 ENCOUNTER — Other Ambulatory Visit: Payer: Self-pay

## 2021-01-24 ENCOUNTER — Ambulatory Visit (INDEPENDENT_AMBULATORY_CARE_PROVIDER_SITE_OTHER): Payer: Medicare HMO | Admitting: Family Medicine

## 2021-01-24 ENCOUNTER — Encounter: Payer: Self-pay | Admitting: Family Medicine

## 2021-01-24 VITALS — BP 116/64 | HR 65 | Temp 97.6°F | Ht 64.0 in | Wt 120.4 lb

## 2021-01-24 DIAGNOSIS — J0100 Acute maxillary sinusitis, unspecified: Secondary | ICD-10-CM | POA: Diagnosis not present

## 2021-01-24 DIAGNOSIS — I10 Essential (primary) hypertension: Secondary | ICD-10-CM | POA: Diagnosis not present

## 2021-01-24 DIAGNOSIS — J302 Other seasonal allergic rhinitis: Secondary | ICD-10-CM | POA: Diagnosis not present

## 2021-01-24 DIAGNOSIS — I4821 Permanent atrial fibrillation: Secondary | ICD-10-CM | POA: Diagnosis not present

## 2021-01-24 DIAGNOSIS — C320 Malignant neoplasm of glottis: Secondary | ICD-10-CM

## 2021-01-24 DIAGNOSIS — M159 Polyosteoarthritis, unspecified: Secondary | ICD-10-CM

## 2021-01-24 DIAGNOSIS — M8949 Other hypertrophic osteoarthropathy, multiple sites: Secondary | ICD-10-CM

## 2021-01-24 LAB — CBC WITH DIFFERENTIAL/PLATELET
Basophils Absolute: 0 10*3/uL (ref 0.0–0.1)
Basophils Relative: 0.6 % (ref 0.0–3.0)
Eosinophils Absolute: 0.1 10*3/uL (ref 0.0–0.7)
Eosinophils Relative: 3.1 % (ref 0.0–5.0)
HCT: 34.4 % — ABNORMAL LOW (ref 36.0–46.0)
Hemoglobin: 11.7 g/dL — ABNORMAL LOW (ref 12.0–15.0)
Lymphocytes Relative: 13 % (ref 12.0–46.0)
Lymphs Abs: 0.6 10*3/uL — ABNORMAL LOW (ref 0.7–4.0)
MCHC: 34 g/dL (ref 30.0–36.0)
MCV: 92.9 fl (ref 78.0–100.0)
Monocytes Absolute: 0.5 10*3/uL (ref 0.1–1.0)
Monocytes Relative: 10.7 % (ref 3.0–12.0)
Neutro Abs: 3.2 10*3/uL (ref 1.4–7.7)
Neutrophils Relative %: 72.6 % (ref 43.0–77.0)
Platelets: 145 10*3/uL — ABNORMAL LOW (ref 150.0–400.0)
RBC: 3.7 Mil/uL — ABNORMAL LOW (ref 3.87–5.11)
RDW: 13.3 % (ref 11.5–15.5)
WBC: 4.4 10*3/uL (ref 4.0–10.5)

## 2021-01-24 LAB — COMPREHENSIVE METABOLIC PANEL
ALT: 14 U/L (ref 0–35)
AST: 23 U/L (ref 0–37)
Albumin: 4.1 g/dL (ref 3.5–5.2)
Alkaline Phosphatase: 137 U/L — ABNORMAL HIGH (ref 39–117)
BUN: 19 mg/dL (ref 6–23)
CO2: 30 mEq/L (ref 19–32)
Calcium: 8.8 mg/dL (ref 8.4–10.5)
Chloride: 99 mEq/L (ref 96–112)
Creatinine, Ser: 0.81 mg/dL (ref 0.40–1.20)
GFR: 63.56 mL/min (ref >60.00)
Glucose, Bld: 86 mg/dL (ref 70–99)
Potassium: 3.9 mEq/L (ref 3.5–5.1)
Sodium: 137 mEq/L (ref 135–145)
Total Bilirubin: 0.4 mg/dL (ref 0.2–1.2)
Total Protein: 6.2 g/dL (ref 6.0–8.3)

## 2021-01-24 MED ORDER — DOXYCYCLINE HYCLATE 100 MG PO TABS: 100.0000 mg | ORAL_TABLET | Freq: Two times a day (BID) | ORAL | 0 refills | Status: DC

## 2021-01-24 MED ORDER — FLUTICASONE PROPIONATE 50 MCG/ACT NA SUSP: 2.0000 | Freq: Every day | NASAL | 6 refills | Status: DC

## 2021-01-24 NOTE — Patient Instructions (Signed)
*  take the doxycycline for sinus infection for at least 4 days. Ok to stop after that if sinuses feel better.   *use flonase in the morning and if nose is dry can put vasoline in nose with qtip in the evening.   *consider mucinex (plain) twice daily for a week or so to help break up congestion.

## 2021-01-24 NOTE — Progress Notes (Signed)
Megan Olsen DOB: [birth year] Encounter date: 01/24/2021  This is a 88 y.o. female who presents with Chief Complaint  Patient presents with   Follow-up    History of present illness: Left side of head stopped up and congested. Now when turning noting some dizziness. Has tried tylenol arthritis; was trying zyrtec but it didn't help. Hurts in back of head on left side as well. Some pressure left ear. No fevers. Sometimes throat feels a little raw - but thinks might be reflux related. Early spring tends to be worse for allergies.   Minimal cough; slight dry cough through day; sometimes wakes at night with cough.   Does get bp checked at home from provider- has been up a little last two times she was there.   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 She feels ok. Energy is good. Some of more intensive cleaning bothers her - more due to difficulty seeing. Enjoys sitting outside, listening to books.   Takes acid reflux medication first, then eats with morning meds.   Osteoporosis: she tried medication at one point but had reaction; not sure what med or reaction. Is taking vitamin D.   Takes trazodone before sleep and then uses lorazepam at bedtime. Feels like these do help her. Can't fall asleep at all without the lorazepam. Feels it does better if she takes it earlier.   Allergies  Allergen Reactions   Propoxyphene N-Acetaminophen Nausea Only   Celecoxib Itching and Rash   Fexofenadine Rash   Penicillins Hives and Rash    Has patient had a PCN reaction causing immediate rash, facial/tongue/throat swelling, SOB or lightheadedness with hypotension: Yes Has patient had a PCN reaction causing severe rash involving mucus membranes or skin necrosis: No Has patient had a PCN reaction that required hospitalization: No Has patient had a PCN reaction occurring within the last 10 years: No If all of the above answers are "NO", then may proceed with  Cephalosporin use.    Current Meds  Medication Sig   acetaminophen (TYLENOL) 650 MG CR tablet Take 650 mg by mouth every 8 (eight) hours as needed for pain.    bisoprolol (ZEBETA) 5 MG tablet Take 1 tablet (5 mg total) by mouth daily.   Cholecalciferol (VITAMIN D3) 2000 units TABS Take 2,000 Units by mouth daily.   dexlansoprazole (DEXILANT) 60 MG capsule Take 1 capsule (60 mg total) by mouth daily.   ELIQUIS 2.5 MG TABS tablet TAKE 1 TABLET TWICE DAILY   hydrochlorothiazide (MICROZIDE) 12.5 MG capsule TAKE 1 CAPSULE EVERY DAY   LORazepam (ATIVAN) 1 MG tablet TAKE 1 TABLET BY MOUTH AT BEDTIME AS NEEDED FOR ANXIETY OR SLEEP   losartan (COZAAR) 25 MG tablet TAKE 1 TABLET EVERY DAY   Multiple Vitamins-Minerals (PRESERVISION AREDS 2) CAPS Take 1 capsule by mouth 2 (two) times daily.   nitroGLYCERIN (NITROSTAT) 0.4 MG SL tablet Place 1 tablet (0.4 mg total) under the tongue every 5 (five) minutes x 3 doses as needed for chest pain.   NON FORMULARY Place 1 Dose into both eyes See admin instructions. Receives injections into both eyes every 5-8 weeks at Dr. Serita Grit office [facility])   traZODone (DESYREL) 100 MG tablet Take 1 tablet (100 mg total) by mouth at bedtime.    Review of Systems  Constitutional:  Negative for activity change, appetite change, chills, fatigue, fever and unexpected weight change.  HENT:  Positive for sinus pressure and sinus pain (left  maxillary). Negative for congestion, ear pain, hearing loss, sore throat and trouble swallowing.   Eyes:  Negative for pain and visual disturbance.  Respiratory:  Negative for cough, chest tightness, shortness of breath and wheezing.   Cardiovascular:  Negative for chest pain, palpitations and leg swelling.  Gastrointestinal:  Negative for abdominal pain, blood in stool, constipation, diarrhea, nausea and vomiting.  Genitourinary:  Negative for difficulty urinating and menstrual problem.  Musculoskeletal:  Negative for  arthralgias and back pain.  Skin:  Negative for rash.  Neurological:  Negative for dizziness, weakness, numbness and headaches.  Hematological:  Negative for adenopathy. Does not bruise/bleed easily.  Psychiatric/Behavioral:  Negative for sleep disturbance and suicidal ideas. The patient is not nervous/anxious.    Objective:  BP 116/64 (BP Location: Left Arm, Patient Position: Sitting, Cuff Size: Normal)   Pulse 65   Temp 97.6 F (36.4 C) (Oral)   Ht 5\' 4"  (1.626 m)   Wt 120 lb 6.4 oz (54.6 kg)   SpO2 95%   BMI 20.67 kg/m   Weight: 120 lb 6.4 oz (54.6 kg)   BP Readings from Last 3 Encounters:  01/24/21 116/64  12/06/20 (!) 162/84  09/26/20 (!) 160/84   Wt Readings from Last 3 Encounters:  01/24/21 120 lb 6.4 oz (54.6 kg)  09/26/20 125 lb (56.7 kg)  05/31/20 123 lb 14.4 oz (56.2 kg)    Physical Exam Constitutional:      General: She is not in acute distress.    Appearance: She is well-developed.  HENT:     Head: Normocephalic and atraumatic.     Right Ear: External ear normal.     Left Ear: External ear normal.     Nose:     Right Turbinates: Enlarged and swollen.     Left Turbinates: Enlarged and swollen (erythematous).     Right Sinus: No maxillary sinus tenderness or frontal sinus tenderness.     Left Sinus: Maxillary sinus tenderness present. No frontal sinus tenderness.     Mouth/Throat:     Pharynx: No oropharyngeal exudate.     Tonsils: No tonsillar exudate or tonsillar abscesses.     Comments: Thick, purulent drainage oropharynx left side Left maxillary tenderness Eyes:     Conjunctiva/sclera: Conjunctivae normal.     Pupils: Pupils are equal, round, and reactive to light.  Neck:     Thyroid: No thyromegaly.  Cardiovascular:     Rate and Rhythm: Normal rate and regular rhythm.     Heart sounds: Normal heart sounds. No murmur heard.   No friction rub. No gallop.  Pulmonary:     Effort: Pulmonary effort is normal.     Breath sounds: Normal breath sounds.   Abdominal:     General: Bowel sounds are normal. There is no distension.     Palpations: Abdomen is soft. There is no mass.     Tenderness: There is no abdominal tenderness. There is no guarding.     Hernia: No hernia is present.  Musculoskeletal:        General: No tenderness or deformity. Normal range of motion.     Cervical back: Normal range of motion and neck supple.  Lymphadenopathy:     Cervical: No cervical adenopathy.  Skin:    General: Skin is warm and dry.     Findings: No rash.  Neurological:     Mental Status: She is alert and oriented to person, place, and time.     Deep Tendon Reflexes: Reflexes normal.  Reflex Scores:      Tricep reflexes are 2+ on the right side and 2+ on the left side.      Bicep reflexes are 2+ on the right side and 2+ on the left side.      Brachioradialis reflexes are 2+ on the right side and 2+ on the left side.      Patellar reflexes are 2+ on the right side and 2+ on the left side. Psychiatric:        Speech: Speech normal.        Behavior: Behavior normal.        Thought Content: Thought content normal.    Assessment/Plan  1. Squamous cell carcinoma of vocal cord [facility]) She entered hospice care with this dx last year as she preferred to avoid treatment/intervention. She has done very well overall since diagnosis.  2. Subacute maxillary sinusitis Ongoing and worsening sinus sx. Short course doxy treatment. Let me know if not improved after this. Discussed restarting flonase which she did well on in th epast.  - doxycycline (VIBRA-TABS) 100 MG tablet; Take 1 tablet (100 mg total) by mouth 2 (two) times daily for 7 days.  Dispense: 14 tablet; Refill: 0  3. Benign essential HTN Well controlled.  Continue current medication bisoprolol 5 mg, hydrochlorothiazide 12.5 mg, losartan 25 mg. - CBC with Differential/Platelet; Future - Comprehensive metabolic panel; Future - Comprehensive metabolic panel - CBC with Differential/Platelet  4.  Permanent atrial fibrillation (HCC) Rate controlled.  5. Primary osteoarthritis involving multiple joints Arthritis is not interfering with her activity too much.  She does not do quite as much as she used to, but is not physically limited from arthritis.  6. Seasonal allergies - fluticasone (FLONASE) 50 MCG/ACT nasal spray; Place 2 sprays into both nostrils daily.  Dispense: 16 g; Refill: 6   Return in about 3 months (around 04/26/2021) for Chronic condition visit virtual visit ok.  With her being in hospice care, we will limit unnecessary screenings and work to simplify medication list where able. If pressures remaining lower; consider cutting off hctz.    Micheline Rough, MD

## 2021-01-28 ENCOUNTER — Other Ambulatory Visit: Payer: Self-pay

## 2021-01-28 ENCOUNTER — Telehealth: Payer: Self-pay | Admitting: Family Medicine

## 2021-01-28 ENCOUNTER — Other Ambulatory Visit: Payer: Self-pay | Admitting: Nurse Practitioner

## 2021-01-28 VITALS — BP 122/60 | HR 65 | Resp 18

## 2021-01-28 DIAGNOSIS — J0100 Acute maxillary sinusitis, unspecified: Secondary | ICD-10-CM

## 2021-01-28 DIAGNOSIS — J302 Other seasonal allergic rhinitis: Secondary | ICD-10-CM

## 2021-01-28 DIAGNOSIS — R530 Neoplastic (malignant) related fatigue: Secondary | ICD-10-CM

## 2021-01-28 DIAGNOSIS — Z515 Encounter for palliative care: Secondary | ICD-10-CM | POA: Diagnosis not present

## 2021-01-28 DIAGNOSIS — C329 Malignant neoplasm of larynx, unspecified: Secondary | ICD-10-CM | POA: Diagnosis not present

## 2021-01-28 MED ORDER — FLUTICASONE PROPIONATE 50 MCG/ACT NA SUSP: 2.0000 | Freq: Every day | NASAL | 6 refills | Status: DC

## 2021-01-28 MED ORDER — DOXYCYCLINE HYCLATE 100 MG PO TABS
100.0000 mg | ORAL_TABLET | Freq: Two times a day (BID) | ORAL | 0 refills | Status: AC
Start: 2021-01-28 — End: 2021-02-04

## 2021-01-28 NOTE — Addendum Note (Signed)
Addended by: Jari Favre C on: 01/28/2021 10:20 PM   Modules accepted: Level of Service

## 2021-01-28 NOTE — Telephone Encounter (Signed)
[facility]

## 2021-01-28 NOTE — Telephone Encounter (Signed)
Patient saw Dr. Ethlyn Gallery on 01/24/2021 and was prescribe doxycycline (vibra-tabs) 100mg  tablet and Fluticasone (flonase) 50 mcg/act nasal spray.  The medications were sent to [facility] mail order pharmacy but should have went to local pharmacy: [id number] [place], [place]. AT [address]

## 2021-01-28 NOTE — Progress Notes (Addendum)
[facility] Consult Note Telephone: [phone]  Fax: [phone]   Date of encounter: 01/28/21 PATIENT NAME: Megan Olsen [address] [place] [address]   [phone] (home)  DOB: May 25, 1928 MRN: [id number]  PRIMARY CARE PROVIDER:    Caren Macadam, MD,  [place] [address] [phone]  REFERRING PROVIDER:   Caren Olsen, [place],   [address] [phone]  RESPONSIBLE PARTY:    Contact Information     Name Relation Home Work Mobile   [place] Niece   [phone]   Megan Olsen [phone]  [phone]     I met face to face with patient in home. Palliative Care was asked to follow this patient by consultation request of  Megan Macadam, MD to address advance care planning and complex medical decision making. This is a follow up visit.                                   ASSESSMENT AND PLAN / RECOMMENDATIONS:   Advance Care Planning/Goals of Care: Goals include to maximize quality of life and symptom management. Our advance care planning conversation included a discussion about:    The value and importance of advance care planning  Experiences with loved ones who have been seriously ill or have died  Exploration of personal, cultural or spiritual beliefs that might influence medical decisions  Exploration of goals of care in the event of a sudden injury or illness  CODE STATUS: DNR Patient's goal of care is comfort. She reiterated desire to not be resuscitated in the event of cardiac or respiratory arrest. Signed DNR and MOST forms present in home, copy on [facility] EMR. Details of MOST form include; Comfort measures, determine use and limitation if antibiotics if infection occurs, no IV fluids, no feeding tube.  Palliative care will continue to provide support to patient, family and medical team.  Symptom  Management/Plan: Fatigue: No report of acute bleed, Hgb 11.7. Report good appetite. Patient has order for Doxycycline for sinus infection, 4 days ago, she is yet to receive the medication form mail order pharmacy. [employer]. Fatigue may be related to infection or her cancer. Phone call made to PCP office to resend medication to local pharmacy for prompt delivery. Continue supportive care. Discussed fall prevention strategies to include use of assistive device during ambulation. Encourage adequate fluid intake. May consider taking Multivites for B vitamins. Laryngeal cancer: condition is stable, no obvious sign of disease progression. No report dysphagia or acute voice change. Continue supportive care. Provided general support and encouragement. Questions and concerns were addressed. Patient was encouraged to call with questions and/or concerns.  Follow up Palliative Care Visit: Palliative care will continue to follow for complex medical decision making, advance care planning, and clarification of goals. Return in about 4-8 weeks or prn.  PPS: 60%  HOSPICE ELIGIBILITY/DIAGNOSIS: TBD  CHIEF COMPLAIN: Fatigue  History obtained from review of Epic EMR and discussion with Megan Olsen.   HISTORY OF PRESENT ILLNESS:  Megan Olsen is a 87 y.o. year old female with complaints of fatigue. Report symptoms ongoing and progressing in the last month. She report fatigue is aggravated by physical activity, saying now gets tired easily doing her house chores. Fatigue is relieved by rest. She denied a decline in appetite, denied falls. Patient lives alone, continues to ambulate  independently within her home and uses a cane when outside her home. Her next door neighbor assist her with transportation within the community. She was seen by her PCP on 01/24/2021. Was started on Doxycycline for sinus infection. Patient report not receiving the prescription yet saying the antibiotics was sent to her mail order pharmacy. She  report ongoing symptoms of congestion and sinus drainage. She however denied fever, denied chills, denied dysphagia, report headache is relieved with PRN Tylenol. Ten systems reviewed and are negative for acute change, except as noted in the HPI.  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 Patient declined treatment for her Laryngeal cancer, was found not eligible for Hospice care.  CBC    Component Value Date/Time   WBC 4.4 01/24/2021 1351   RBC 3.70 (L) 01/24/2021 1351   HGB 11.7 (L) 01/24/2021 1351   HGB 11.1 03/24/2019 1438   HCT 34.4 (L) 01/24/2021 1351   HCT 34.3 03/24/2019 1438   PLT 145.0 (L) 01/24/2021 1351   PLT 167 03/24/2019 1438   MCV 92.9 01/24/2021 1351   MCV 98 (H) 03/24/2019 1438   MCH 29.9 05/31/2020 1558   MCHC 34.0 01/24/2021 1351   RDW 13.3 01/24/2021 1351   RDW 12.0 03/24/2019 1438   LYMPHSABS 0.6 (L) 01/24/2021 1351   LYMPHSABS 0.6 (L) 03/24/2019 1438   MONOABS 0.5 01/24/2021 1351   EOSABS 0.1 01/24/2021 1351   EOSABS 0.2 03/24/2019 1438   BASOSABS 0.0 01/24/2021 1351   BASOSABS 0.0 03/24/2019 1438    CMP     Component Value Date/Time   NA 137 01/24/2021 1351   NA 140 03/24/2019 1438   K 3.9 01/24/2021 1351   CL 99 01/24/2021 1351   CO2 30 01/24/2021 1351   GLUCOSE 86 01/24/2021 1351   BUN 19 01/24/2021 1351   BUN 21 03/24/2019 1438   CREATININE 0.81 01/24/2021 1351   CREATININE 0.78 05/31/2020 1558   CALCIUM 8.8 01/24/2021 1351   PROT 6.2 01/24/2021 1351   ALBUMIN 4.1 01/24/2021 1351   AST 23 01/24/2021 1351   ALT 14 01/24/2021 1351   ALKPHOS 137 (H) 01/24/2021 1351   BILITOT 0.4 01/24/2021 1351   GFRNONAA 52 (L) 03/24/2019 1438   GFRAA 60 03/24/2019 1438    I reviewed available labs,  medications, imaging, studies and related documents from the EMR.  Records reviewed and summarized above.   Vitals:   01/28/21 1434  BP: 122/60  Pulse: 65  Resp: 18  SpO2: 94%    Physical Exam: General: frail appearing, cooperative, sitting in chair in NAD EYES: anicteric sclera ENMT: intact hearing, oral mucous membranes moist CV:  no LE edema Pulmonary: no increased work of breathing, no cough, no audible wheezes, room air Abdomen:  no ascites HWE:XHBZJ all extremities, no contractures, ambulatory Skin: warm and dry, no rashes or wounds on visible skin Neuro: A and O x 3 Psych: non-anxious affect today Hem/lymph/immuno: no widespread bruising    Thank you for the opportunity to participate in the care of Ms. Glaspy.  The palliative care team will continue to follow. Please call our office at [phone] if we can be of additional assistance.   Alba Destine, NP DNP,AGPCNP-BC  COVID-19 PATIENT SCREENING TOOL Asked and negative response unless otherwise noted:   Have you had symptoms of covid, tested positive or  been in contact with someone with symptoms/positive test in the past 5-10 days?

## 2021-01-29 ENCOUNTER — Ambulatory Visit (INDEPENDENT_AMBULATORY_CARE_PROVIDER_SITE_OTHER): Payer: Medicare HMO

## 2021-01-29 ENCOUNTER — Telehealth: Payer: Self-pay | Admitting: Family Medicine

## 2021-01-29 DIAGNOSIS — I495 Sick sinus syndrome: Secondary | ICD-10-CM

## 2021-01-29 LAB — CUP PACEART REMOTE DEVICE CHECK
Battery Remaining Longevity: 101 mo
Battery Remaining Percentage: 82 %
Battery Voltage: 3.01 V
Brady Statistic RV Percent Paced: 97 %
Date Time Interrogation Session: 20220713020014
Implantable Lead Implant Date: 20191009
Implantable Lead Location: 753860
Implantable Lead Model: 1948
Implantable Pulse Generator Implant Date: 20191009
Lead Channel Impedance Value: 640 Ohm
Lead Channel Pacing Threshold Amplitude: 0.75 V
Lead Channel Pacing Threshold Pulse Width: 0.5 ms
Lead Channel Sensing Intrinsic Amplitude: 10.8 mV
Lead Channel Setting Pacing Amplitude: 2.5 V
Lead Channel Setting Pacing Pulse Width: 0.5 ms
Lead Channel Setting Sensing Sensitivity: 2 mV
Pulse Gen Model: 1272
Pulse Gen Serial Number: 9067558

## 2021-01-29 NOTE — Telephone Encounter (Signed)
FYI:  Pt is calling to check the status of the medication (antibiotic) and stated that she would like for it to go to her local pharmacy instead of her mail order pharmacy.  Pt stated that she just wanted to let the provider know that she has not rec'd it and will be getting it from [facility] they are shipping it out today (01/29/2021) and she should get it by Tuesday or Wednesday of next week.  Pt wanted [place] to cancel the prescription at the local pharmacy.  Mechele Claude advised that the pt call the local pharmacy and let them know that she does not want the prescription.  Pt  was called back and pt stated that she will call the local pharmacy to decline the medication.

## 2021-01-29 NOTE — Telephone Encounter (Signed)
Noted  

## 2021-02-12 DIAGNOSIS — H353221 Exudative age-related macular degeneration, left eye, with active choroidal neovascularization: Secondary | ICD-10-CM | POA: Diagnosis not present

## 2021-02-19 DIAGNOSIS — H353211 Exudative age-related macular degeneration, right eye, with active choroidal neovascularization: Secondary | ICD-10-CM | POA: Diagnosis not present

## 2021-02-21 NOTE — Progress Notes (Signed)
Remote pacemaker transmission.   

## 2021-03-06 ENCOUNTER — Telehealth: Payer: Self-pay

## 2021-03-06 NOTE — Telephone Encounter (Signed)
(  4:27 pm) SW returned call to patient's niece-Andrea. She advised that patient be placed on the schedule for follow-up. RN/SW visit scheduled for 03/12/21 @ 11 am.

## 2021-03-12 ENCOUNTER — Other Ambulatory Visit: Payer: Self-pay

## 2021-03-12 ENCOUNTER — Other Ambulatory Visit: Payer: Self-pay | Admitting: *Deleted

## 2021-03-12 VITALS — BP 126/70 | HR 65 | Temp 97.7°F | Resp 16

## 2021-03-12 DIAGNOSIS — Z515 Encounter for palliative care: Secondary | ICD-10-CM

## 2021-03-13 NOTE — Progress Notes (Signed)
COMMUNITY PALLIATIVE CARE SW NOTE  PATIENT NAME: Megan Olsen DOB: 17-Jul-1928 MRN: [id number]  PRIMARY CARE PROVIDER: Caren Macadam, MD  RESPONSIBLE PARTY:  Acct ID - Guarantor Home Phone Work Phone Relationship Acct Type  [id number] KARAN, [phone]  Self P/F     [address] [name], [place] [address]   Initial COVID screening is negative  PLAN OF CARE and INTERVENTIONS:             GOALS OF CARE/ ADVANCE CARE PLANNING:  Goal is for patient to remain in her apartment. Patient has a DNR and MOST form.  SOCIAL/EMOTIONAL/SPIRITUAL ASSESSMENT/ INTERVENTIONS:  SW and RN- M. Nadara Mustard completed a joint visit with patient at her apartment. Patient greeted the team at the door and invited them in. SW observed her ambulating independently with no problems.  The team assessed patient status. Patient reported that she was "feeling okay". Patient report that she does have swallowing discomfort on the left side of her throat. Patient report that she feels that someone has her hands around her throat at times. She describes having coughing seizures. She now eats softer foods. Her appetite remains good. She ambulates independently. She is independent for all ADL's. She report some weight loss over time, but report it has been steady the past few weeks. She denies pain. Her energy level is dependent on how well she sleeps the night before. Patient report that does have some nights where she does not sleep well. SW provided assessment of needs and comfort of patient, supportive presence, active listening, and reassurance of ongoing support.  PATIENT/CAREGIVER EDUCATION/ COPING:  Patient report that she is coping well. She is alert and oriented x3. She has some vision issues, but is able to remain and live independent in her home with daily contact from her family, friends and neighbors.  PERSONAL EMERGENCY PLAN:  911 can abe activated for emergencies.  COMMUNITY RESOURCES  COORDINATION/ HEALTH CARE NAVIGATION:  None. FINANCIAL/LEGAL CONCERNS/INTERVENTIONS:  None.     SOCIAL HX:  Social History   Tobacco Use   Smoking status: Never   Smokeless tobacco: Never  Substance Use Topics   Alcohol use: Yes    Comment: 04/27/2018 "a beer q 3 months or so; if that"    CODE STATUS: DNR ADVANCED DIRECTIVES: No MOST FORM COMPLETE:  Yes HOSPICE EDUCATION PROVIDED: No  PPS: Patient is alert and oriented x3. She has vision issues, but remains independent for all ADL's.  Duration of visit and documentation: 60 minutes.   [address] [place], [place]

## 2021-03-23 ENCOUNTER — Other Ambulatory Visit: Payer: Self-pay | Admitting: Internal Medicine

## 2021-03-23 ENCOUNTER — Other Ambulatory Visit: Payer: Self-pay | Admitting: Cardiology

## 2021-03-25 DIAGNOSIS — C329 Malignant neoplasm of larynx, unspecified: Secondary | ICD-10-CM | POA: Diagnosis not present

## 2021-03-25 NOTE — Telephone Encounter (Signed)
Prescription refill request for Eliquis received. Indication: Afib  Last office visit: 04/09/20 Charlcie Cradle)  Scr: 0.81 Age: 87 Weight:54.6kg  Appropriate dose and refill sent to requested pharmacy

## 2021-04-07 NOTE — Progress Notes (Signed)
[facility] RN NOTE  PATIENT NAME: Megan Olsen DOB: [birth year] MRN: [id number]  PRIMARY CARE PROVIDER: Caren Macadam, MD  RESPONSIBLE PARTY:  Acct ID - Guarantor Home Phone Work Phone Relationship Acct Type  [id number] DESHUNDA, [phone]  Self P/F     [place], [place] [address]   Covid-19 Pre-screening Negative  PLAN OF CARE and INTERVENTION:  ADVANCE CARE PLANNING/GOALS OF CARE: Goal is for patient to remain in her apartment and avoid hospitalizations. PATIENT/CAREGIVER EDUCATION: Symptom management DISEASE STATUS: Joint follow-up palliative care visit made with LCSW, M. Lonon. Met with patient in her apartment. She is alert and oriented x 4. Pleasant mood and engaging. She speaks about having some discomfort on the left side of her throat especially with swallowing. She says she feels this is odd because her laryngeal cancer diagnosis is on the right side. She also can not tolerate anything to touch her neck at times. Some nights she has difficulty sleeping due to neck tightness and thinking that something is choking her. She has an occasional dry cough. She reports having coughing seizures at times. She states that drinking water seems to help this. Her appetite is good. She is eating a softer diet which has helped. She is taking her medications whole without difficulty. She says she has lost some weight and is down to size 12 or 14. She remains ambulatory using a cane or walker. She is independent with ADLs. She is unable to travel outside of her home without assistance. She has an appointment with her ENT, Dr Vevelyn Royals on Sept 6, 2022.She continues to remain as active as possible. She enjoys listening to books on tape due to her worsening vision. Will continue to monitor.   HISTORY OF PRESENT ILLNESS:  This is a 87 yo female with a diagnosis of laryngeal cancer. Palliative care team continues to follow patient for symptom  management, goals of care and complex decision making.  CODE STATUS: DNR ADVANCED DIRECTIVES: Y MOST FORM: yes PPS: 60%   PHYSICAL EXAM:   VITALS: Today's Vitals   03/12/21 1102  BP: 126/70  Pulse: 65  Resp: 16  Temp: 97.7 F (36.5 C)  TempSrc: Temporal  SpO2: 98%  PainSc: 0-No pain    LUNGS: clear to auscultation  CARDIAC: Cor RRR EXTREMITIES: No edema SKIN:  Exposed skin is dry and intact   NEURO:  Alert and oriented x 4, pleasant mood, ambulatory   (Duration of visit and documentation 45 minutes)   Daryl Eastern, RN BSN

## 2021-04-21 ENCOUNTER — Other Ambulatory Visit: Payer: Self-pay | Admitting: Family Medicine

## 2021-04-21 DIAGNOSIS — F411 Generalized anxiety disorder: Secondary | ICD-10-CM

## 2021-04-23 NOTE — Telephone Encounter (Signed)
Patient called regarding her refill for lorazepam, stating that the pharmacy told her they have contacted the office regarding a refill and have not heard back.  Informed patient that Dr. Ethlyn Gallery was out of the office yesterday and that today, they are responding to messages and requests in between patients as they come in. Patient asked for a message to be passed along to Dr. Ethlyn Gallery letting her know that the pharmacy says they have reached out regarding the refill.  Please advise.

## 2021-04-28 ENCOUNTER — Encounter: Payer: Self-pay | Admitting: Family Medicine

## 2021-04-28 ENCOUNTER — Other Ambulatory Visit: Payer: Self-pay

## 2021-04-28 ENCOUNTER — Telehealth (INDEPENDENT_AMBULATORY_CARE_PROVIDER_SITE_OTHER): Payer: Medicare HMO | Admitting: Family Medicine

## 2021-04-28 DIAGNOSIS — F411 Generalized anxiety disorder: Secondary | ICD-10-CM

## 2021-04-28 DIAGNOSIS — K219 Gastro-esophageal reflux disease without esophagitis: Secondary | ICD-10-CM

## 2021-04-28 DIAGNOSIS — I4821 Permanent atrial fibrillation: Secondary | ICD-10-CM | POA: Diagnosis not present

## 2021-04-28 DIAGNOSIS — C320 Malignant neoplasm of glottis: Secondary | ICD-10-CM

## 2021-04-28 DIAGNOSIS — I10 Essential (primary) hypertension: Secondary | ICD-10-CM

## 2021-04-28 DIAGNOSIS — F5101 Primary insomnia: Secondary | ICD-10-CM | POA: Diagnosis not present

## 2021-04-28 MED ORDER — LORAZEPAM 1 MG PO TABS: ORAL_TABLET | ORAL | 3 refills | Status: DC

## 2021-04-28 NOTE — Progress Notes (Signed)
Virtual Visit via Video Note  I connected with Megan Olsen  on 04/28/21 at 12:00 PM EDT by a video enabled telemedicine application and verified that I am speaking with the correct person using two identifiers.  Location patient: home Location provider: [place], [place] [address] Persons participating in the virtual visit: patient, provider  I discussed the limitations of evaluation and management by telemedicine and the availability of in person appointments. The patient expressed understanding and agreed to proceed.   Megan Olsen DOB: Dec 29, 1927 Encounter date: 04/28/2021  This is a 87 y.o. female who presents with Chief Complaint  Patient presents with   Follow-up    Patient states she was taking Lorazepam, has been off of this for a week, complains of lack of sleep and does not want a refill     History of present illness:  Laryngeal cancer; she is under palliative care. Diagnosis made by Dr. Vevelyn Royals through [facility] one year ago. Has to be careful with any very cold foods or drinks; those seem to be harder to swallow. She states that the right vocal cord was viewed by ENT beginning of September by Dr. Vevelyn Royals in [place] and lesion wasn't there. Throat is sensitive, but she has allergies too, so that may be related.   Insomnia: takes the lorazepam at night for sleep; needed refill. Has been without it for 7 days. Not sleeping well at all. She had requested a refill of the medication and we refused; trazodone doesn't help. She can't fall asleep without the lorazepam. She was up until 2 this morning. She doesn't want to go through the difficulty and trouble of requesting and getting her medication and then being without it after going through this.  She states that her granddaughter was very upset that medication was not refilled.  On chart review, patient had called on 10/3 stating that pharmacy had reach out for refill and provider  had not responded.  Provider was out of the office until 10/5.  Refill was completed on 10/5.  Unfortunately, pharmacy had documented (this was confirmed with phone call to pharmacy during time of visit yesterday) that patient did not want to be called when prescriptions are ready for pickup, so patient was unaware that prescription had been sent in.  HTN: not checking bp at home. Takes the losartan 25mg  daily.   Following regularly with Dr. Posey Pronto for macular degeneration. She doesn't see well or drive, but she is able to stay in apartment by herself. She still does her own meal prep.   Acid reflux has been well controlled on the dexilant.    Allergies  Allergen Reactions   Propoxyphene N-Acetaminophen Nausea Only   Celecoxib Itching and Rash   Fexofenadine Rash   Penicillins Hives and Rash    Has patient had a PCN reaction causing immediate rash, facial/tongue/throat swelling, SOB or lightheadedness with hypotension: Yes Has patient had a PCN reaction causing severe rash involving mucus membranes or skin necrosis: No Has patient had a PCN reaction that required hospitalization: No Has patient had a PCN reaction occurring within the last 10 years: No If all of the above answers are "NO", then may proceed with Cephalosporin use.    Current Meds  Medication Sig   acetaminophen (TYLENOL) 650 MG CR tablet Take 650 mg by mouth every 8 (eight) hours as needed for pain.    bisoprolol (ZEBETA) 5 MG tablet Take 1 tablet (5 mg total)  by mouth daily. Please make overdue appt with Dr. Caryl Comes before anymore refills. Thank you 1st attempt   Cholecalciferol (VITAMIN D3) 2000 units TABS Take 2,000 Units by mouth daily.   dexlansoprazole (DEXILANT) 60 MG capsule Take 1 capsule (60 mg total) by mouth daily.   ELIQUIS 2.5 MG TABS tablet TAKE 1 TABLET TWICE DAILY   fluticasone (FLONASE) 50 MCG/ACT nasal spray Place 2 sprays into both nostrils daily. (Patient taking differently: Place 2 sprays into both  nostrils as needed.)   hydrochlorothiazide (MICROZIDE) 12.5 MG capsule Take 1 capsule (12.5 mg total) by mouth daily. Please make overdue appt with Dr. Caryl Comes before anymore refills. Thank you 1st attempt   losartan (COZAAR) 25 MG tablet TAKE 1 TABLET EVERY DAY   Multiple Vitamins-Minerals (PRESERVISION AREDS 2) CAPS Take 1 capsule by mouth 2 (two) times daily.   nitroGLYCERIN (NITROSTAT) 0.4 MG SL tablet Place 1 tablet (0.4 mg total) under the tongue every 5 (five) minutes x 3 doses as needed for chest pain.   NON FORMULARY Place 1 Dose into both eyes See admin instructions. Receives injections into both eyes every 5-8 weeks at Dr. Serita Grit office [facility])   [DISCONTINUED] traZODone (DESYREL) 100 MG tablet Take 1 tablet (100 mg total) by mouth at bedtime.    Review of Systems  Constitutional:  Negative for chills, fatigue and fever.  Eyes:  Positive for visual disturbance (vision is poor due to macular degeneration).  Respiratory:  Negative for cough, chest tightness, shortness of breath and wheezing.   Cardiovascular:  Negative for chest pain, palpitations and leg swelling.   Objective:  There were no vitals taken for this visit.      BP Readings from Last 3 Encounters:  03/12/21 126/70  01/28/21 122/60  01/24/21 116/64   Wt Readings from Last 3 Encounters:  01/24/21 120 lb 6.4 oz (54.6 kg)  09/26/20 125 lb (56.7 kg)  05/31/20 123 lb 14.4 oz (56.2 kg)    EXAM:  GENERAL: alert, oriented, appears well and in no acute distress  HEENT: atraumatic, conjunctiva clear, no obvious abnormalities on inspection of external nose and ears  NECK: normal movements of the head and neck  LUNGS: on inspection no signs of respiratory distress, breathing rate appears normal, no obvious gross SOB, gasping or wheezing  CV: no obvious cyanosis  MS: moves all visible extremities without noticeable abnormality  PSYCH/NEURO: pleasant and cooperative, no obvious depression or  anxiety, speech and thought processing grossly intact   Assessment/Plan  1. Benign essential HTN Blood pressure has been well controlled with the bisoprolol 5 mg daily.  2. Permanent atrial fibrillation (HCC) Rate control with bisoprolol 5 mg daily.  Continues on Eliquis 2.5 mg twice daily.  3. Gastroesophageal reflux disease, unspecified whether esophagitis present Well-controlled with Dexilant 60 mg daily.  Continue this medication.  4. Primary insomnia Continue with Ativan 1 mg p.o. nightly as needed sleep.  Patient has been stable on this medication for years.  She has tried other sleep medications (like trazodone) without success.  She is under hospice care.  I do not want her to run out of medication again like she did this last time.  We called her pharmacy and they have her prescription ready for pickup for 30 tablets.  I have sent a prescription for 90-day supply with 3 refills to her mail order pharmacy.  I am hopeful that this transition will enable her to get her medications in a more timely fashion and without delay.  I have also advised her that she is always able to call the office and asked for me to call her back regarding concerns that she may have.  She is limited in her transportation secondary to her macular degeneration and does not need any obstacles to obtaining her medications on time.   5. Squamous cell carcinoma of vocal cord [facility]) She is following regularly with Dr. Vevelyn Royals through [facility].  She tells me that on her last scope, he did not see the previously visualized and biopsied lesion.  I am going to request this note for our records since I am unable to see this through [facility].     I discussed the assessment and treatment plan with the patient. The patient was provided an opportunity to ask questions and all were answered. The patient agreed with the plan and demonstrated an understanding of the instructions.   The patient was advised to call back  or seek an in-person evaluation if the symptoms worsen or if the condition fails to improve as anticipated.  I provided 30 minutes of face-to-face time during this encounter.   Micheline Rough, MD

## 2021-04-29 ENCOUNTER — Telehealth: Payer: Self-pay | Admitting: *Deleted

## 2021-04-29 NOTE — Telephone Encounter (Signed)
-----   Message from Caren Macadam, MD sent at 04/29/2021  1:58 PM EDT ----- Patient states she saw Dr. Vevelyn Royals through [facility] last month and procedure was done (laryngoscope) but I can't see note through care everywhere. Patient saw him at different facility; but I would still think I should be able to see in chart. Can you see if their office can forward note from visit? Usually they do. And please get pharmacy to confirm all immunizations on her. She states she completed shingrix series there. Not sure what else we are missing that she has completed through pharmacy. Thanks!

## 2021-04-29 NOTE — Telephone Encounter (Signed)
Freda Munro called back from [facility]-Dr Britt's office and stated she will research for the report as below and send via fax once completed.

## 2021-04-29 NOTE — Telephone Encounter (Signed)
Left a detailed message on the voicemail of Dr Britt's office [phone] per google search) to request notes be sent as below.  Per Clair Gulling the pharmacist at [facility], per the records there the patient only had one Shingrix vaccine on 09/18/2019 and this was previously documented in the immunizations.  Message sent to PCP.

## 2021-04-30 ENCOUNTER — Ambulatory Visit (INDEPENDENT_AMBULATORY_CARE_PROVIDER_SITE_OTHER): Payer: Medicare HMO

## 2021-04-30 DIAGNOSIS — I495 Sick sinus syndrome: Secondary | ICD-10-CM | POA: Diagnosis not present

## 2021-04-30 DIAGNOSIS — H353221 Exudative age-related macular degeneration, left eye, with active choroidal neovascularization: Secondary | ICD-10-CM | POA: Diagnosis not present

## 2021-05-01 LAB — CUP PACEART REMOTE DEVICE CHECK
Battery Remaining Longevity: 98 mo
Battery Remaining Percentage: 80 %
Battery Voltage: 3.01 V
Brady Statistic RV Percent Paced: 96 %
Date Time Interrogation Session: 20221012020014
Implantable Lead Implant Date: 20191009
Implantable Lead Location: 753860
Implantable Lead Model: 1948
Implantable Pulse Generator Implant Date: 20191009
Lead Channel Impedance Value: 640 Ohm
Lead Channel Pacing Threshold Amplitude: 0.75 V
Lead Channel Pacing Threshold Pulse Width: 0.5 ms
Lead Channel Sensing Intrinsic Amplitude: 12 mV
Lead Channel Setting Pacing Amplitude: 2.5 V
Lead Channel Setting Pacing Pulse Width: 0.5 ms
Lead Channel Setting Sensing Sensitivity: 2 mV
Pulse Gen Model: 1272
Pulse Gen Serial Number: 9067558

## 2021-05-08 NOTE — Progress Notes (Signed)
Remote pacemaker transmission.   

## 2021-05-14 DIAGNOSIS — H353211 Exudative age-related macular degeneration, right eye, with active choroidal neovascularization: Secondary | ICD-10-CM | POA: Diagnosis not present

## 2021-05-17 ENCOUNTER — Other Ambulatory Visit: Payer: Self-pay | Admitting: Internal Medicine

## 2021-06-23 DIAGNOSIS — H35363 Drusen (degenerative) of macula, bilateral: Secondary | ICD-10-CM | POA: Diagnosis not present

## 2021-06-23 DIAGNOSIS — H353231 Exudative age-related macular degeneration, bilateral, with active choroidal neovascularization: Secondary | ICD-10-CM | POA: Diagnosis not present

## 2021-06-23 DIAGNOSIS — H35453 Secondary pigmentary degeneration, bilateral: Secondary | ICD-10-CM | POA: Diagnosis not present

## 2021-06-23 DIAGNOSIS — Z961 Presence of intraocular lens: Secondary | ICD-10-CM | POA: Diagnosis not present

## 2021-06-25 ENCOUNTER — Telehealth: Payer: Self-pay | Admitting: Family Medicine

## 2021-06-25 MED ORDER — LOSARTAN POTASSIUM 25 MG PO TABS: 25.0000 mg | ORAL_TABLET | Freq: Every day | ORAL | 0 refills | Status: DC

## 2021-06-25 NOTE — Telephone Encounter (Signed)
Selinda Orion customer service with [facility] is calling and patient needs new rx's on LORazepam (ATIVAN) 1 MG tablet #30 ,  losartan (COZAAR) 25 MG tablet #90 and Trazodone 100 mg #90 .  [place], [place] Phone:  [phone]  Fax:  [phone]

## 2021-06-27 ENCOUNTER — Other Ambulatory Visit: Payer: Self-pay | Admitting: Family Medicine

## 2021-06-27 DIAGNOSIS — F411 Generalized anxiety disorder: Secondary | ICD-10-CM

## 2021-06-27 MED ORDER — TRAZODONE HCL 100 MG PO TABS: 100.0000 mg | ORAL_TABLET | Freq: Every day | ORAL | 3 refills | Status: DC

## 2021-06-27 MED ORDER — LOSARTAN POTASSIUM 25 MG PO TABS: 25.0000 mg | ORAL_TABLET | Freq: Every day | ORAL | 3 refills | Status: DC

## 2021-06-27 MED ORDER — LORAZEPAM 1 MG PO TABS: ORAL_TABLET | ORAL | 3 refills | Status: DC

## 2021-07-21 ENCOUNTER — Other Ambulatory Visit: Payer: Self-pay | Admitting: Family Medicine

## 2021-07-21 DIAGNOSIS — J0100 Acute maxillary sinusitis, unspecified: Secondary | ICD-10-CM

## 2021-07-23 DIAGNOSIS — H353221 Exudative age-related macular degeneration, left eye, with active choroidal neovascularization: Secondary | ICD-10-CM | POA: Diagnosis not present

## 2021-07-30 ENCOUNTER — Ambulatory Visit (INDEPENDENT_AMBULATORY_CARE_PROVIDER_SITE_OTHER): Payer: Medicare HMO

## 2021-07-30 DIAGNOSIS — I495 Sick sinus syndrome: Secondary | ICD-10-CM | POA: Diagnosis not present

## 2021-07-30 LAB — CUP PACEART REMOTE DEVICE CHECK
Battery Remaining Longevity: 97 mo
Battery Remaining Percentage: 79 %
Battery Voltage: 3.01 V
Brady Statistic RV Percent Paced: 96 %
Date Time Interrogation Session: 20230111020013
Implantable Lead Implant Date: 20191009
Implantable Lead Location: 753860
Implantable Lead Model: 1948
Implantable Pulse Generator Implant Date: 20191009
Lead Channel Impedance Value: 660 Ohm
Lead Channel Pacing Threshold Amplitude: 0.75 V
Lead Channel Pacing Threshold Pulse Width: 0.5 ms
Lead Channel Sensing Intrinsic Amplitude: 11.2 mV
Lead Channel Setting Pacing Amplitude: 2.5 V
Lead Channel Setting Pacing Pulse Width: 0.5 ms
Lead Channel Setting Sensing Sensitivity: 2 mV
Pulse Gen Model: 1272
Pulse Gen Serial Number: 9067558

## 2021-08-08 NOTE — Progress Notes (Signed)
Remote pacemaker transmission.   

## 2021-08-14 DIAGNOSIS — H353211 Exudative age-related macular degeneration, right eye, with active choroidal neovascularization: Secondary | ICD-10-CM | POA: Diagnosis not present

## 2021-09-02 ENCOUNTER — Other Ambulatory Visit: Payer: Self-pay | Admitting: *Deleted

## 2021-09-02 ENCOUNTER — Ambulatory Visit (INDEPENDENT_AMBULATORY_CARE_PROVIDER_SITE_OTHER): Payer: Medicare HMO

## 2021-09-02 VITALS — Ht 63.0 in | Wt 114.0 lb

## 2021-09-02 DIAGNOSIS — Z0000 Encounter for general adult medical examination without abnormal findings: Secondary | ICD-10-CM | POA: Diagnosis not present

## 2021-09-02 MED ORDER — DEXLANSOPRAZOLE 60 MG PO CPDR: 60.0000 mg | DELAYED_RELEASE_CAPSULE | Freq: Every day | ORAL | 0 refills | Status: DC

## 2021-09-02 NOTE — Patient Instructions (Signed)
Megan Olsen , Thank you for taking time to come for your Medicare Wellness Visit. I appreciate your ongoing commitment to your health goals. Please review the following plan we discussed and let me know if I can assist you in the future.   Screening recommendations/referrals: Colonoscopy: not required Mammogram: not required Bone Density: completed 11/05/2014 Recommended yearly ophthalmology/optometry visit for glaucoma screening and checkup Recommended yearly dental visit for hygiene and checkup  Vaccinations: Influenza vaccine: completed 05/22/2021, due next flu season Pneumococcal vaccine: completed 05/31/2015 Tdap vaccine: completed 05/02/2012, due 05/02/2022 Shingles vaccine: needs second dose   Covid-19: 05/22/2021, 12/24/2020, 07/15/2020, 05/25/2020  Advanced directives: copy in chart  Conditions/risks identified: none  Next appointment: Follow up in one year for your annual wellness visit    Preventive Care 65 Years and Older, Female Preventive care refers to lifestyle choices and visits with your health care provider that can promote health and wellness. What does preventive care include? A yearly physical exam. This is also called an annual well check. Dental exams once or twice a year. Routine eye exams. Ask your health care provider how often you should have your eyes checked. Personal lifestyle choices, including: Daily care of your teeth and gums. Regular physical activity. Eating a healthy diet. Avoiding tobacco and drug use. Limiting alcohol use. Practicing safe sex. Taking low-dose aspirin every day. Taking vitamin and mineral supplements as recommended by your health care provider. What happens during an annual well check? The services and screenings done by your health care provider during your annual well check will depend on your age, overall health, lifestyle risk factors, and family history of disease. Counseling  Your health care provider may ask you  questions about your: Alcohol use. Tobacco use. Drug use. Emotional well-being. Home and relationship well-being. Sexual activity. Eating habits. History of falls. Memory and ability to understand (cognition). Work and work Statistician. Reproductive health. Screening  You may have the following tests or measurements: Height, weight, and BMI. Blood pressure. Lipid and cholesterol levels. These may be checked every 5 years, or more frequently if you are over 42 years old. Skin check. Lung cancer screening. You may have this screening every year starting at age 22 if you have a 30-pack-year history of smoking and currently smoke or have quit within the past 15 years. Fecal occult blood test (FOBT) of the stool. You may have this test every year starting at age 38. Flexible sigmoidoscopy or colonoscopy. You may have a sigmoidoscopy every 5 years or a colonoscopy every 10 years starting at age 7. Hepatitis C blood test. Hepatitis B blood test. Sexually transmitted disease (STD) testing. Diabetes screening. This is done by checking your blood sugar (glucose) after you have not eaten for a while (fasting). You may have this done every 1-3 years. Bone density scan. This is done to screen for osteoporosis. You may have this done starting at age 69. Mammogram. This may be done every 1-2 years. Talk to your health care provider about how often you should have regular mammograms. Talk with your health care provider about your test results, treatment options, and if necessary, the need for more tests. Vaccines  Your health care provider may recommend certain vaccines, such as: Influenza vaccine. This is recommended every year. Tetanus, diphtheria, and acellular pertussis (Tdap, Td) vaccine. You may need a Td booster every 10 years. Zoster vaccine. You may need this after age 38. Pneumococcal 13-valent conjugate (PCV13) vaccine. One dose is recommended after age 50. Pneumococcal polysaccharide  (  PPSV23) vaccine. One dose is recommended after age 73. Talk to your health care provider about which screenings and vaccines you need and how often you need them. This information is not intended to replace advice given to you by your health care provider. Make sure you discuss any questions you have with your health care provider. Document Released: 08/02/2015 Document Revised: 03/25/2016 Document Reviewed: 05/07/2015 Elsevier Interactive Patient Education  2017 [employer] Prevention in the Home Falls can cause injuries. They can happen to people of all ages. There are many things you can do to make your home safe and to help prevent falls. What can I do on the outside of my home? Regularly fix the edges of walkways and driveways and fix any cracks. Remove anything that might make you trip as you walk through a door, such as a raised step or threshold. Trim any bushes or trees on the path to your home. Use bright outdoor lighting. Clear any walking paths of anything that might make someone trip, such as rocks or tools. Regularly check to see if handrails are loose or broken. Make sure that both sides of any steps have handrails. Any raised decks and porches should have guardrails on the edges. Have any leaves, snow, or ice cleared regularly. Use sand or salt on walking paths during winter. Clean up any spills in your garage right away. This includes oil or grease spills. What can I do in the bathroom? Use night lights. Install grab bars by the toilet and in the tub and shower. Do not use towel bars as grab bars. Use non-skid mats or decals in the tub or shower. If you need to sit down in the shower, use a plastic, non-slip stool. Keep the floor dry. Clean up any water that spills on the floor as soon as it happens. Remove soap buildup in the tub or shower regularly. Attach bath mats securely with double-sided non-slip rug tape. Do not have throw rugs and other things on the  floor that can make you trip. What can I do in the bedroom? Use night lights. Make sure that you have a light by your bed that is easy to reach. Do not use any sheets or blankets that are too big for your bed. They should not hang down onto the floor. Have a firm chair that has side arms. You can use this for support while you get dressed. Do not have throw rugs and other things on the floor that can make you trip. What can I do in the kitchen? Clean up any spills right away. Avoid walking on wet floors. Keep items that you use a lot in easy-to-reach places. If you need to reach something above you, use a strong step stool that has a grab bar. Keep electrical cords out of the way. Do not use floor polish or wax that makes floors slippery. If you must use wax, use non-skid floor wax. Do not have throw rugs and other things on the floor that can make you trip. What can I do with my stairs? Do not leave any items on the stairs. Make sure that there are handrails on both sides of the stairs and use them. Fix handrails that are broken or loose. Make sure that handrails are as long as the stairways. Check any carpeting to make sure that it is firmly attached to the stairs. Fix any carpet that is loose or worn. Avoid having throw rugs at the top or bottom  of the stairs. If you do have throw rugs, attach them to the floor with carpet tape. Make sure that you have a light switch at the top of the stairs and the bottom of the stairs. If you do not have them, ask someone to add them for you. What else can I do to help prevent falls? Wear shoes that: Do not have high heels. Have rubber bottoms. Are comfortable and fit you well. Are closed at the toe. Do not wear sandals. If you use a stepladder: Make sure that it is fully opened. Do not climb a closed stepladder. Make sure that both sides of the stepladder are locked into place. Ask someone to hold it for you, if possible. Clearly mark and make  sure that you can see: Any grab bars or handrails. First and last steps. Where the edge of each step is. Use tools that help you move around (mobility aids) if they are needed. These include: Canes. Walkers. Scooters. Crutches. Turn on the lights when you go into a dark area. Replace any light bulbs as soon as they burn out. Set up your furniture so you have a clear path. Avoid moving your furniture around. If any of your floors are uneven, fix them. If there are any pets around you, be aware of where they are. Review your medicines with your doctor. Some medicines can make you feel dizzy. This can increase your chance of falling. Ask your doctor what other things that you can do to help prevent falls. This information is not intended to replace advice given to you by your health care provider. Make sure you discuss any questions you have with your health care provider. Document Released: 05/02/2009 Document Revised: 12/12/2015 Document Reviewed: 08/10/2014 Elsevier Interactive Patient Education  2017 [employer].

## 2021-09-02 NOTE — Progress Notes (Signed)
I connected with Megan Olsen today by telephone and verified that I am speaking with the correct person using two identifiers. Location patient: home Location provider: work Persons participating in the virtual visit: Shateka, Petrea LPN.   I discussed the limitations, risks, security and privacy concerns of performing an evaluation and management service by telephone and the availability of in person appointments. I also discussed with the patient that there may be a patient responsible charge related to this service. The patient expressed understanding and verbally consented to this telephonic visit.    Interactive audio and video telecommunications were attempted between this provider and patient, however failed, due to patient having technical difficulties OR patient did not have access to video capability.  We continued and completed visit with audio only.     Vital signs may be patient reported or missing.  Subjective:   Megan Olsen is a 88 y.o. female who presents for Medicare Annual (Subsequent) preventive examination.  Review of Systems     Cardiac Risk Factors include: advanced age (>24men, >29 women);hypertension     Objective:    Today's Vitals   09/02/21 1411  Weight: 114 lb (51.7 kg)  Height: 5\' 3"  (1.6 m)   Body mass index is 20.19 kg/m.  Advanced Directives 09/02/2021 09/26/2020 01/30/2019 05/25/2018 04/27/2018 05/06/2017 02/23/2017  Does Patient Have a Medical Advance Directive? Yes Yes No Yes No Yes Yes  Type of Advance Directive Out of facility DNR (pink MOST or yellow form) [place];Living will - Living will - - [place];Living will  Does patient want to make changes to medical advance directive? - No - Patient declined - No - Patient declined - - Yes (Inpatient - patient requests chaplain consult to change a medical advance directive)  Copy of [place] in Chart? - No - copy requested - -  - - No - copy requested  Would patient like information on creating a medical advance directive? - - No - Patient declined - No - Patient declined - -  Pre-existing out of facility DNR order (yellow form or pink MOST form) Pink MOST/Yellow Form most recent copy in chart - Physician notified to receive inpatient order - - - - - -    Current Medications (verified) Outpatient Encounter Medications as of 09/02/2021  Medication Sig   acetaminophen (TYLENOL) 650 MG CR tablet Take 650 mg by mouth every 8 (eight) hours as needed for pain.    bisoprolol (ZEBETA) 5 MG tablet Take 1 tablet (5 mg total) by mouth daily. Please make overdue appt with Dr. Caryl Comes before anymore refills. Thank you 1st attempt   Cholecalciferol (VITAMIN D3) 2000 units TABS Take 2,000 Units by mouth daily.   dexlansoprazole (DEXILANT) 60 MG capsule Take 1 capsule (60 mg total) by mouth daily.   ELIQUIS 2.5 MG TABS tablet TAKE 1 TABLET TWICE DAILY   fluticasone (FLONASE) 50 MCG/ACT nasal spray Place 2 sprays into both nostrils daily. (Patient taking differently: Place 2 sprays into both nostrils as needed.)   LORazepam (ATIVAN) 1 MG tablet TAKE 1 TABLET BY MOUTH AT BEDTIME AS NEEDED FOR ANXIETY OR SLEEP   losartan (COZAAR) 25 MG tablet Take 1 tablet (25 mg total) by mouth daily.   Multiple Vitamins-Minerals (PRESERVISION AREDS 2) CAPS Take 1 capsule by mouth 2 (two) times daily.   nitroGLYCERIN (NITROSTAT) 0.4 MG SL tablet Place 1 tablet (0.4 mg total) under the tongue every 5 (five) minutes x 3  doses as needed for chest pain.   NON FORMULARY Place 1 Dose into both eyes See admin instructions. Receives injections into both eyes every 5-8 weeks at Dr. Serita Grit office [facility])   traZODone (DESYREL) 100 MG tablet Take 1 tablet (100 mg total) by mouth at bedtime.   hydrochlorothiazide (MICROZIDE) 12.5 MG capsule Take 1 capsule (12.5 mg total) by mouth daily. Please make overdue appt with Dr. Caryl Comes before anymore refills.  Thank you 1st attempt (Patient not taking: Reported on 09/02/2021)   No facility-administered encounter medications on file as of 09/02/2021.    Allergies (verified) Propoxyphene n-acetaminophen, Celecoxib, Fexofenadine, and Penicillins   History: Past Medical History:  Diagnosis Date   Age-related macular degeneration, wet, both eyes ([place])    Anxiety disorder 07/06/2014   Arthritis    "all over" (04/27/2018)   Atrial fibrillation ([place])    catheter ablation of SVT in 2002   Benign essential HTN 07/06/2014   Constipation, intermittent 08/04/2007   CVA (cerebrovascular accident) ([place]) 2003   left brain; denies residual on 04/27/2018   Depression    GERD (gastroesophageal reflux disease)    Heart murmur    hx (04/27/2018)   Hypothyroidism 09/07/2014   "off RX now" (04/27/2018)   Iron deficiency anemia    Iron deficiency anemia, unspecified 01/02/2013   Osteoarthritis    Osteoporosis 01/11/2007   Pneumonia    "one time; years and years ago" (04/27/2018)   Presence of permanent cardiac pacemaker 04/27/2018    Dual Chamber   Right BBB/left ant fasc block    Past Surgical History:  Procedure Laterality Date   APPENDECTOMY     CATARACT EXTRACTION W/ INTRAOCULAR LENS  IMPLANT, BILATERAL Bilateral    CHOLECYSTECTOMY OPEN     COLONOSCOPY     EYE SURGERY     FRACTURE SURGERY     HIP PINNING,CANNULATED Left 07/07/2014   Procedure: CANNULATED HIP PINNING;  Surgeon: Mauri Pole, MD;  Location: [facility];  Service: Orthopedics;  Laterality: Left;   INSERT / REPLACE / REMOVE PACEMAKER  04/27/2018    Dual Chamber   JOINT REPLACEMENT     LAPAROSCOPIC OVARIAN CYSTECTOMY     LEFT HEART CATH AND CORONARY ANGIOGRAPHY N/A 01/31/2019   Procedure: LEFT HEART CATH AND CORONARY ANGIOGRAPHY;  Surgeon: Leonie Man, MD;  Location: [place] [facility];  Service: Cardiovascular;  Laterality: N/A;   NASAL SEPTUM SURGERY     PACEMAKER IMPLANT N/A 04/27/2018   Procedure: PACEMAKER IMPLANT - Dual Chamber;   Surgeon: Deboraha Sprang, MD;  Location: [place] [facility];  Service: Cardiovascular;  Laterality: N/A;   SVT ABLATION  2002   TONSILLECTOMY     TOTAL ABDOMINAL HYSTERECTOMY     TOTAL HIP ARTHROPLASTY Right 02/23/2017   Procedure: RIGHT TOTAL HIP ARTHROPLASTY ANTERIOR APPROACH;  Surgeon: Paralee Cancel, MD;  Location: [facility];  Service: Orthopedics;  Laterality: Right;   VITRECTOMY Right 2008   dr Zadie Rhine.  Vitrectomy and removal of tissue.    Family History  Problem Relation Age of Onset   Early death Mother    Nephritis Mother    Kidney disease Mother    Heart attack Father    Heart disease Sister    Dementia Sister    Heart attack Brother    Dementia Sister    Social History   Socioeconomic History   Marital status: Single    Spouse name: Not on file   Number of children: Not on file  Years of education: Not on file   Highest education level: Not on file  Occupational History   Not on file  Tobacco Use   Smoking status: Never    Passive exposure: Never   Smokeless tobacco: Never  Vaping Use   Vaping Use: Never used  Substance and Sexual Activity   Alcohol use: Not Currently    Comment: occasionally   Drug use: Never   Sexual activity: Not Currently  Other Topics Concern   Not on file  Social History Narrative   Not on file   Social Determinants of Health   Financial Resource Strain: Low Risk    Difficulty of Paying Living Expenses: Not hard at all  Food Insecurity: No Food Insecurity   Worried About Charity fundraiser in the Last Year: Never true   [place] in the Last Year: Never true  Transportation Needs: No Transportation Needs   Lack of Transportation (Medical): No   Lack of Transportation (Non-Medical): No  Physical Activity: Inactive   Days of Exercise per Week: 0 days   Minutes of Exercise per Session: 0 min  Stress: No Stress Concern Present   Feeling of Stress : Only a little  Social Connections: Moderately Isolated   Frequency of  Communication with Friends and Family: More than three times a week   Frequency of Social Gatherings with Friends and Family: More than three times a week   Attends Religious Services: Never   Marine scientist or Organizations: Yes   Attends Archivist Meetings: Never   Marital Status: Never married    Tobacco Counseling Counseling given: Not Answered   Clinical Intake:  Pre-visit preparation completed: Yes  Pain : No/denies pain     Nutritional Status: BMI of 19-24  Normal Nutritional Risks: None Diabetes: No  How often do you need to have someone help you when you read instructions, pamphlets, or other written materials from your doctor or pharmacy?: 1 - Never  Diabetic? no  Interpreter Needed?: No  Information entered by :: NAllen LPN   Activities of Daily Living In your present state of health, do you have any difficulty performing the following activities: 09/02/2021 09/26/2020  Hearing? [name]  Vision? Y Y  Comment macular degeneration -  Difficulty concentrating or making decisions? N N  Walking or climbing stairs? N N  Dressing or bathing? N N  Doing errands, shopping? Y Y  Comment does not drive Facilities manager and eating ? N N  Using the Toilet? N N  In the past six months, have you accidently leaked urine? N N  Do you have problems with loss of bowel control? N N  Managing your Medications? N N  Managing your Finances? N N  Housekeeping or managing your Housekeeping? N N  Some recent data might be hidden    Patient Care Team: Caren Macadam, MD as PCP - General (Family Medicine) Jerline Pain, MD as PCP - Cardiology (Cardiology) Deboraha Sprang, MD as PCP - Electrophysiology (Cardiology)  Indicate any recent Medical Services you may have received from other than [facility] providers in the past year (date may be approximate).     Assessment:   This is a routine wellness examination for Megan Olsen.  Hearing/Vision screen Vision  Screening - Comments:: Regular eye exams, Dr. Posey Pronto  Dietary issues and exercise activities discussed: Current Exercise Habits: The patient does not participate in regular exercise at present   Goals Addressed  This Visit's Progress    Patient Stated       09/02/2021, stay alive       Depression Screen PHQ 2/9 Scores 09/02/2021 04/28/2021 01/24/2021 09/26/2020 09/12/2019 08/22/2018 05/25/2018  PHQ - 2 Score 0 0 0 0 0 1 0  PHQ- 9 Score - 3 7 2  - 3 3    Fall Risk Fall Risk  09/02/2021 09/26/2020 09/12/2019 08/22/2018 05/25/2018  Falls in the past year? 0 0 0 0 1  Number falls in past yr: - 0 0 0 0  Injury with Fall? - 0 0 0 0  Risk Factor Category  - - - - -  Risk for fall due to : Medication side effect Impaired balance/gait - - -  Follow up Falls evaluation completed;Education provided;Falls prevention discussed Falls evaluation completed;Falls prevention discussed - - -    FALL RISK PREVENTION PERTAINING TO THE HOME:  Any stairs in or around the home? No  If so, are there any without handrails?  N/a Home free of loose throw rugs in walkways, pet beds, electrical cords, etc? Yes  Adequate lighting in your home to reduce risk of falls? Yes   ASSISTIVE DEVICES UTILIZED TO PREVENT FALLS:  Life alert? No  Use of a cane, walker or w/c? No  Grab bars in the bathroom? Yes  Shower chair or bench in shower? No  Elevated toilet seat or a handicapped toilet? No   TIMED UP AND GO:  Was the test performed? No .      Cognitive Function:     6CIT Screen 09/02/2021 05/25/2018  What Year? 0 points 4 points  What month? 0 points 0 points  What time? 0 points 0 points  Count back from 20 0 points 4 points  Months in reverse 0 points 0 points  Repeat phrase 6 points 10 points  Total Score 6 18    Immunizations Immunization History  Administered Date(s) Administered   Fluad Quad(high Dose 65+) 05/12/2019, 05/31/2020, 05/22/2021   Influenza Split 04/24/2011, 05/02/2012    Influenza Whole 04/19/1998, 06/03/2007, 05/08/2008, 04/03/2010   Influenza, High Dose Seasonal PF 05/31/2015, 05/21/2016, 04/14/2017, 04/28/2018   Influenza,inj,Quad PF,6+ Mos 04/10/2013, 03/30/2014   PFIZER(Purple Top)SARS-COV-2 Vaccination 05/25/2020, 07/15/2020, 12/24/2020   Pfizer Covid-19 Vaccine Bivalent Booster 29yrs & up 05/22/2021   Pneumococcal Conjugate-13 05/31/2015   Pneumococcal Polysaccharide-23 07/20/1996, 05/02/2012   Tdap 05/02/2012   Zoster Recombinat (Shingrix) 09/18/2019   Zoster, Live 04/03/2010    TDAP status: Up to date  Flu Vaccine status: Up to date  Pneumococcal vaccine status: Up to date  Covid-19 vaccine status: Completed vaccines  Qualifies for Shingles Vaccine? Yes   Zostavax completed Yes   Shingrix Completed?: needs second dose  Screening Tests Health Maintenance  Topic Date Due   Zoster Vaccines- Shingrix (2 of 2) 11/13/2019   TETANUS/TDAP  05/02/2022   Pneumonia Vaccine 57+ Years old  Completed   INFLUENZA VACCINE  Completed   DEXA SCAN  Completed   COVID-19 Vaccine  Completed   HPV VACCINES  Aged Out    Health Maintenance  Health Maintenance Due  Topic Date Due   Zoster Vaccines- Shingrix (2 of 2) 11/13/2019    Colorectal cancer screening: No longer required.   Mammogram status: No longer required due to age.  Bone Density status: Completed 11/05/2014.  Lung Cancer Screening: (Low Dose CT Chest recommended if Age 4-80 years, 30 pack-year currently smoking OR have quit w/in 15years.) does not qualify.   Lung Cancer Screening Referral:  no  Additional Screening:  Hepatitis C Screening: does not qualify;   Vision Screening: Recommended annual ophthalmology exams for early detection of glaucoma and other disorders of the eye. Is the patient up to date with their annual eye exam?  Yes  Who is the provider or what is the name of the office in which the patient attends annual eye exams? Dr. Posey Pronto If pt is not established with a  provider, would they like to be referred to a provider to establish care? No .   Dental Screening: Recommended annual dental exams for proper oral hygiene  Community Resource Referral / Chronic Care Management: CRR required this visit?  No   CCM required this visit?  No      Plan:     I have personally reviewed and noted the following in the patients chart:   Medical and social history Use of alcohol, tobacco or illicit drugs  Current medications and supplements including opioid prescriptions.  Functional ability and status Nutritional status Physical activity Advanced directives List of other physicians Hospitalizations, surgeries, and ER visits in previous 12 months Vitals Screenings to include cognitive, depression, and falls Referrals and appointments  In addition, I have reviewed and discussed with patient certain preventive protocols, quality metrics, and best practice recommendations. A written personalized care plan for preventive services as well as general preventive health recommendations were provided to patient.     Kellie Simmering, LPN   2/29/7989   Nurse Notes: none  Due to this being a virtual visit, the after visit summary with patients personalized plan was offered to patient via mail or my-chart. per request, patient was mailed a copy of AVS.

## 2021-09-03 ENCOUNTER — Telehealth: Payer: Self-pay | Admitting: Family Medicine

## 2021-09-03 ENCOUNTER — Other Ambulatory Visit: Payer: Self-pay | Admitting: Family Medicine

## 2021-09-03 DIAGNOSIS — J0100 Acute maxillary sinusitis, unspecified: Secondary | ICD-10-CM

## 2021-09-03 NOTE — Telephone Encounter (Signed)
Megan Olsen with [facility] is calling in to speak with someone because dexlansoprazole ([place]) 16 MG capsule [[id number]]  isn't covered under insurance.  [employer] number is [phone]  Please advise.

## 2021-09-03 NOTE — Telephone Encounter (Signed)
Can we do prior auth? She has been controlled on this since 2010. She tried to decrease about 6 months ago and symptoms came back. She has tried omeprazole and pantoprazole without relief of symptoms in the past. Additionally she has laryngeal cancer which adds challenge with swallowing.

## 2021-09-03 NOTE — Telephone Encounter (Signed)
Message sent to PCP for alternatives.

## 2021-09-04 NOTE — Telephone Encounter (Signed)
Prior auth for Dexlansoprazole 60mg  was sent to [email or web address]-Key: [id number].

## 2021-09-05 ENCOUNTER — Other Ambulatory Visit: Payer: Self-pay | Admitting: Family Medicine

## 2021-09-05 DIAGNOSIS — J302 Other seasonal allergic rhinitis: Secondary | ICD-10-CM

## 2021-09-09 NOTE — Telephone Encounter (Signed)
Fax received stating the request was approved, this is a non-formulary drug and may be more expensive than other covered alternatives.  Fax placed on PCP's desk.

## 2021-10-02 ENCOUNTER — Telehealth: Payer: Self-pay | Admitting: Internal Medicine

## 2021-10-02 MED ORDER — HYDROCHLOROTHIAZIDE 12.5 MG PO CAPS: 12.5000 mg | ORAL_CAPSULE | Freq: Every day | ORAL | 0 refills | Status: DC

## 2021-10-02 NOTE — Telephone Encounter (Signed)
?*  STAT* If patient is at the pharmacy, call can be transferred to refill team. ? ? ?1. Which medications need to be refilled? (please list name of each medication and dose if known)  ?hydrochlorothiazide (MICROZIDE) 12.5 MG capsule ? ?2. Which pharmacy/location (including street and city if local pharmacy) is medication to be sent to? ?[place], [place] ? ?3. Do they need a 30 day or 90 day supply?  ? ?Patient has been completely out of medication for the past few weeks. She is requesting enough medication to last her until her appointment on 4/21 with Dr. Caryl Comes. ? ?

## 2021-10-02 NOTE — Telephone Encounter (Signed)
Pt's medication was sent to pt's pharmacy as requested. Confirmation received.  °

## 2021-10-15 DIAGNOSIS — H353221 Exudative age-related macular degeneration, left eye, with active choroidal neovascularization: Secondary | ICD-10-CM | POA: Diagnosis not present

## 2021-10-22 ENCOUNTER — Other Ambulatory Visit: Payer: Self-pay | Admitting: Family Medicine

## 2021-10-22 DIAGNOSIS — J0100 Acute maxillary sinusitis, unspecified: Secondary | ICD-10-CM

## 2021-10-29 ENCOUNTER — Ambulatory Visit (INDEPENDENT_AMBULATORY_CARE_PROVIDER_SITE_OTHER): Payer: Medicare HMO

## 2021-10-29 DIAGNOSIS — I495 Sick sinus syndrome: Secondary | ICD-10-CM | POA: Diagnosis not present

## 2021-10-29 LAB — CUP PACEART REMOTE DEVICE CHECK
Battery Remaining Longevity: 94 mo
Battery Remaining Percentage: 77 %
Battery Voltage: 3.01 V
Brady Statistic RV Percent Paced: 96 %
Date Time Interrogation Session: 20230412020013
Implantable Lead Implant Date: 20191009
Implantable Lead Location: 753860
Implantable Lead Model: 1948
Implantable Pulse Generator Implant Date: 20191009
Lead Channel Impedance Value: 660 Ohm
Lead Channel Pacing Threshold Amplitude: 0.75 V
Lead Channel Pacing Threshold Pulse Width: 0.5 ms
Lead Channel Sensing Intrinsic Amplitude: 10.4 mV
Lead Channel Setting Pacing Amplitude: 2.5 V
Lead Channel Setting Pacing Pulse Width: 0.5 ms
Lead Channel Setting Sensing Sensitivity: 2 mV
Pulse Gen Model: 1272
Pulse Gen Serial Number: 9067558

## 2021-11-05 DIAGNOSIS — H353211 Exudative age-related macular degeneration, right eye, with active choroidal neovascularization: Secondary | ICD-10-CM | POA: Diagnosis not present

## 2021-11-07 ENCOUNTER — Encounter: Payer: Self-pay | Admitting: Hematology and Oncology

## 2021-11-07 ENCOUNTER — Ambulatory Visit: Payer: Medicare HMO | Admitting: Internal Medicine

## 2021-11-07 ENCOUNTER — Encounter: Payer: Self-pay | Admitting: Internal Medicine

## 2021-11-07 VITALS — BP 146/76 | HR 66 | Ht 63.0 in | Wt 118.2 lb

## 2021-11-07 DIAGNOSIS — Z950 Presence of cardiac pacemaker: Secondary | ICD-10-CM | POA: Diagnosis not present

## 2021-11-07 DIAGNOSIS — I495 Sick sinus syndrome: Secondary | ICD-10-CM

## 2021-11-07 DIAGNOSIS — I4821 Permanent atrial fibrillation: Secondary | ICD-10-CM | POA: Diagnosis not present

## 2021-11-07 DIAGNOSIS — I5181 Takotsubo syndrome: Secondary | ICD-10-CM | POA: Diagnosis not present

## 2021-11-07 MED ORDER — BISOPROLOL FUMARATE 5 MG PO TABS: 7.5000 mg | ORAL_TABLET | Freq: Every day | ORAL | 3 refills | Status: DC

## 2021-11-07 NOTE — Patient Instructions (Signed)
Medication Instructions:  ?Your physician has recommended you make the following change in your medication:  ? ?Increase your Bisoprolol to 1-1/2 tablets by mouth daily (7.'5mg'$ ) ? ?*If you need a refill on your cardiac medications before your next appointment, please call your pharmacy* ? ? ?Lab Work: ?CBC and BMET today ?If you have labs (blood work) drawn today and your tests are completely normal, you will receive your results only by: ?MyChart Message (if you have MyChart) OR ?A paper copy in the mail ?If you have any lab test that is abnormal or we need to change your treatment, we will call you to review the results. ? ? ?Testing/Procedures: ?None ordered. ? ? ? ?Follow-Up: ?At [facility], you and your health needs are our priority.  As part of our continuing mission to provide you with exceptional heart care, we have created designated Provider Care Teams.  These Care Teams include your primary Cardiologist (physician) and Advanced Practice Providers (APPs -  Physician Assistants and Nurse Practitioners) who all work together to provide you with the care you need, when you need it. ? ?We recommend signing up for the patient portal called "MyChart".  Sign up information is provided on this After Visit Summary.  MyChart is used to connect with patients for Virtual Visits (Telemedicine).  Patients are able to view lab/test results, encounter notes, upcoming appointments, etc.  Non-urgent messages can be sent to your provider as well.   ?To learn more about what you can do with MyChart, go to [email or web address].   ? ?Your next appointment:   ?12 month(s) ? ?The format for your next appointment:   ?In Person ? ?Provider:   ?[name], MD{ ? ? ?Important Information About Sugar ? ? ? ? ?  ?

## 2021-11-07 NOTE — Progress Notes (Signed)
? ? ? ? ?Patient Care Team: ?Caren Macadam, MD as PCP - General (Family Medicine) ?Jerline Pain, MD as PCP - Cardiology (Cardiology) ?Deboraha Sprang, MD as PCP - Electrophysiology (Cardiology) ? ? ?HPI ? ?Megan Olsen is a 88 y.o. female ?Seen in follow-up for  single chamber Abbott pacemaker implanted 9/19 for tachybradycardia in the setting of permanent atrial fibrillation.   ? ?Admitted 7/20 with chest pain/non-STEMI apical ballooning concerning for Tako-Tsubo. ? ?The patient denies chest pain, nocturnal dyspnea, orthopnea or peripheral edema.  There have been no palpitations, or syncope.  Complains of dyspnea on exertion.  Worse over the last 6-12 months.  Wonders whether it is sinus.  Also has had 2 episodes of presyncope.  1 while standing at the kitchen and the other while vacuuming and turning her head quickly.  She should have a CBC also placed.  ? ?DATE TEST EF   ?7/20 LHC  45-50 % CorArt normal  ?? Tako-tsubo  ?7/20 Echo  55-65% LAE BiP vol 48 ?Dwyane Luo severe  ? ? ? ?Date Cr K Hgb  ?10/19 0.67 4.4 10.1  ?7/20 0.88 4.5 11.7  ?7/22 0.81 3.9 11.7  ? ? ?Thromboembolic risk factors ( age -27, HTN-1, TIA/CVA-2, DM-1, Gender-1) for a CHADSVASc Score of >= 7 ? ?Records and Results Reviewed  ? ?Past Medical History:  ?Diagnosis Date  ? Age-related macular degeneration, wet, both eyes ([place])   ? Anxiety disorder 07/06/2014  ? Arthritis   ? "all over" (04/27/2018)  ? Atrial fibrillation ([place])   ? catheter ablation of SVT in 2002  ? Benign essential HTN 07/06/2014  ? Constipation, intermittent 08/04/2007  ? CVA (cerebrovascular accident) [facility]) 2003  ? left brain; denies residual on 04/27/2018  ? Depression   ? GERD (gastroesophageal reflux disease)   ? Heart murmur   ? hx (04/27/2018)  ? Hypothyroidism 09/07/2014  ? "off RX now" (04/27/2018)  ? Iron deficiency anemia   ? Iron deficiency anemia, unspecified 01/02/2013  ? Osteoarthritis   ? Osteoporosis 01/11/2007  ? Pneumonia   ? "one time; years and years ago"  (04/27/2018)  ? Presence of permanent cardiac pacemaker 04/27/2018  ?  Dual Chamber  ? Right BBB/left ant fasc block   ? ? ?Past Surgical History:  ?Procedure Laterality Date  ? APPENDECTOMY    ? CATARACT EXTRACTION W/ INTRAOCULAR LENS  IMPLANT, BILATERAL Bilateral   ? CHOLECYSTECTOMY OPEN    ? COLONOSCOPY    ? EYE SURGERY    ? FRACTURE SURGERY    ? HIP PINNING,CANNULATED Left 07/07/2014  ? Procedure: CANNULATED HIP PINNING;  Surgeon: Mauri Pole, MD;  Location: [facility];  Service: Orthopedics;  Laterality: Left;  ? INSERT / REPLACE / REMOVE PACEMAKER  04/27/2018  ?  Dual Chamber  ? JOINT REPLACEMENT    ? LAPAROSCOPIC OVARIAN CYSTECTOMY    ? LEFT HEART CATH AND CORONARY ANGIOGRAPHY N/A 01/31/2019  ? Procedure: LEFT HEART CATH AND CORONARY ANGIOGRAPHY;  Surgeon: Leonie Man, MD;  Location: [facility];  Service: Cardiovascular;  Laterality: N/A;  ? NASAL SEPTUM SURGERY    ? PACEMAKER IMPLANT N/A 04/27/2018  ? Procedure: PACEMAKER IMPLANT - Dual Chamber;  Surgeon: Deboraha Sprang, MD;  Location: [place] [facility];  Service: Cardiovascular;  Laterality: N/A;  ? SVT ABLATION  2002  ? TONSILLECTOMY    ? TOTAL ABDOMINAL HYSTERECTOMY    ? TOTAL HIP ARTHROPLASTY Right 02/23/2017  ? Procedure: RIGHT TOTAL HIP ARTHROPLASTY  ANTERIOR APPROACH;  Surgeon: Paralee Cancel, MD;  Location: [facility];  Service: Orthopedics;  Laterality: Right;  ? VITRECTOMY Right 2008  ? dr Zadie Rhine.  Vitrectomy and removal of tissue.   ? ? ?Current Meds  ?Medication Sig  ? acetaminophen (TYLENOL) 650 MG CR tablet Take 650 mg by mouth every 8 (eight) hours as needed for pain.   ? bisoprolol (ZEBETA) 5 MG tablet Take 1 tablet (5 mg total) by mouth daily. Please make overdue appt with Dr. Caryl Comes before anymore refills. Thank you 1st attempt  ? Cholecalciferol (VITAMIN D3) 2000 units TABS Take 2,000 Units by mouth daily.  ? dexlansoprazole (DEXILANT) 60 MG capsule Take 1 capsule (60 mg total) by mouth daily.  ? ELIQUIS 2.5 MG TABS tablet TAKE 1 TABLET  TWICE DAILY  ? fluticasone (FLONASE) 50 MCG/ACT nasal spray PLACE 2 SPRAYS INTO BOTH NOSTRILS DAILY.  ? hydrochlorothiazide (MICROZIDE) 12.5 MG capsule Take 1 capsule (12.5 mg total) by mouth daily. Please keep upcoming appt with Dr. Caryl Comes in April 2023 before anymore refills. Thank you Final attempt  ? LORazepam (ATIVAN) 1 MG tablet TAKE 1 TABLET BY MOUTH AT BEDTIME AS NEEDED FOR ANXIETY OR SLEEP  ? losartan (COZAAR) 25 MG tablet Take 1 tablet (25 mg total) by mouth daily.  ? Multiple Vitamins-Minerals (PRESERVISION AREDS 2) CAPS Take 1 capsule by mouth 2 (two) times daily.  ? nitroGLYCERIN (NITROSTAT) 0.4 MG SL tablet Place 1 tablet (0.4 mg total) under the tongue every 5 (five) minutes x 3 doses as needed for chest pain.  ? NON FORMULARY Place 1 Dose into both eyes See admin instructions. Receives injections into both eyes every 5-8 weeks at Dr. Serita Grit office [facility])  ? traZODone (DESYREL) 100 MG tablet Take 1 tablet (100 mg total) by mouth at bedtime.  ? ? ?Allergies  ?Allergen Reactions  ? Propoxyphene N-Acetaminophen Nausea Only  ? Celecoxib Itching and Rash  ? Fexofenadine Rash  ? Penicillins Hives and Rash  ?  Has patient had a PCN reaction causing immediate rash, facial/tongue/throat swelling, SOB or lightheadedness with hypotension: Yes ?Has patient had a PCN reaction causing severe rash involving mucus membranes or skin necrosis: No ?Has patient had a PCN reaction that required hospitalization: No ?Has patient had a PCN reaction occurring within the last 10 years: No ?If all of the above answers are "NO", then may proceed with Cephalosporin use. ?  ? ? ? ? ?Review of Systems negative except from HPI and PMH ? ?Physical Exam ?BP (!) 146/76   Pulse 66   Ht '5\' 3"'$  (1.6 m)   Wt 118 lb 3.2 oz (53.6 kg)   SpO2 96%   BMI 20.94 kg/m?  ?Well developed and well nourished in no acute distress ?HENT normal ?Neck supple with JVP-flat ?Clear ?Device pocket well healed; without hematoma or  erythema.  There is no tethering  ?Regular rate and rhythm, no  gallop No murmur ?Abd-soft with active BS ?No Clubbing cyanosis  edema ?Skin-warm and dry ?A & Oriented  Grossly normal sensory and motor function ? ?ECG atrial fibrillation with ventricular pacing occasional intrinsic conduction at 66 ? ?  ? ? ?Assessment and  Plan ? ?Atrial fibrillation-slow ventricular response ? ?Bradycardia ? ?Pacemaker [employer]   ? ?Lightheadedness ? ?Dyspnea on exertion ? ?Hypertension ? ? ?Dyspnea on exertion.  Device demonstrates perhaps some right shift in her heart rate.   Atrial fibrillation is permanent.  We will increase the bisoprolol as noted below to augment  heart rate control ? ?Blood pressure remains elevated here and so we will increase her bisoprolol from 5--7.5.  Continue her losartan 25. ? ?We will check her CBC; no major bleeding on the Eliquis, appropriately dosed to 2.5 mg twice daily for age and weight. ? ?No evidence of orthostasis ? ? ?  ?  ? ? ?. ?

## 2021-11-08 LAB — BASIC METABOLIC PANEL
BUN/Creatinine Ratio: 25 (ref 12–28)
BUN: 20 mg/dL (ref 10–36)
CO2: 25 mmol/L (ref 20–29)
Calcium: 9.3 mg/dL (ref 8.7–10.3)
Chloride: 100 mmol/L (ref 96–106)
Creatinine, Ser: 0.79 mg/dL (ref 0.57–1.00)
Glucose: 95 mg/dL (ref 70–99)
Potassium: 4.2 mmol/L (ref 3.5–5.2)
Sodium: 140 mmol/L (ref 134–144)
eGFR: 71 mL/min/{1.73_m2} (ref >59)

## 2021-11-08 LAB — CBC
Hematocrit: 36.1 % (ref 34.0–46.6)
Hemoglobin: 12.2 g/dL (ref 11.1–15.9)
MCH: 30 pg (ref 26.6–33.0)
MCHC: 33.8 g/dL (ref 31.5–35.7)
MCV: 89 fL (ref 79–97)
Platelets: 160 x10E3/uL (ref 150–450)
RBC: 4.06 x10E6/uL (ref 3.77–5.28)
RDW: 13.1 % (ref 11.7–15.4)
WBC: 5.2 x10E3/uL (ref 3.4–10.8)

## 2021-11-11 ENCOUNTER — Telehealth: Payer: Self-pay

## 2021-11-11 NOTE — Telephone Encounter (Signed)
PC volunteer call made. Per call, ENT MD confirmed that malignancy was gone and did not want return call from palliative.  Will discharge outpatient palliative care ?

## 2021-11-12 ENCOUNTER — Other Ambulatory Visit: Payer: Self-pay | Admitting: Family Medicine

## 2021-11-14 NOTE — Progress Notes (Signed)
Remote pacemaker transmission.   

## 2021-12-04 ENCOUNTER — Telehealth: Payer: Self-pay

## 2021-12-04 NOTE — Telephone Encounter (Signed)
Spoke with pt and advised per Dr Klein labs are normal.  Pt verbalizes understanding and thanked RN for the call. 

## 2021-12-04 NOTE — Telephone Encounter (Signed)
-----   Message from Deboraha Sprang, MD sent at 12/02/2021  4:55 AM EDT ----- Please Inform Patient that labs are normal  Thanks

## 2022-01-05 DIAGNOSIS — H53141 Visual discomfort, right eye: Secondary | ICD-10-CM | POA: Diagnosis not present

## 2022-01-05 DIAGNOSIS — H35453 Secondary pigmentary degeneration, bilateral: Secondary | ICD-10-CM | POA: Diagnosis not present

## 2022-01-05 DIAGNOSIS — H353231 Exudative age-related macular degeneration, bilateral, with active choroidal neovascularization: Secondary | ICD-10-CM | POA: Diagnosis not present

## 2022-01-05 DIAGNOSIS — H35363 Drusen (degenerative) of macula, bilateral: Secondary | ICD-10-CM | POA: Diagnosis not present

## 2022-01-05 DIAGNOSIS — H43393 Other vitreous opacities, bilateral: Secondary | ICD-10-CM | POA: Diagnosis not present

## 2022-01-05 DIAGNOSIS — Z961 Presence of intraocular lens: Secondary | ICD-10-CM | POA: Diagnosis not present

## 2022-01-07 DIAGNOSIS — H353221 Exudative age-related macular degeneration, left eye, with active choroidal neovascularization: Secondary | ICD-10-CM | POA: Diagnosis not present

## 2022-01-13 ENCOUNTER — Telehealth: Payer: Self-pay | Admitting: Family Medicine

## 2022-01-13 NOTE — Telephone Encounter (Signed)
Patient's power of attorney, Sue Lush, stopped by to drop off FL2 form needing to be filled out by provider to get patient into assisted living. I let patient know that a visit may be required since she hasn't been seen in office in so long. Sue Lush stated that an office visit is not preferred due to schedule and mobility issues on their end. I let Sue Lush know that there was a five to seven business day turn around and to give a call back around this time to check on forms. If a visit is to be scheduled then Sue Lush would like the call to schedule, same for when paperwork is complete.    Good callback number is [phone]         Please advise

## 2022-01-14 DIAGNOSIS — H353211 Exudative age-related macular degeneration, right eye, with active choroidal neovascularization: Secondary | ICD-10-CM | POA: Diagnosis not present

## 2022-01-14 NOTE — Telephone Encounter (Signed)
Left a detailed message at Andrea's cell number stating per [facility] a visit is needed, to call to schedule an appointment and a virtual visit could be scheduled if the patient cannot come in to the office.

## 2022-01-16 ENCOUNTER — Encounter: Payer: Self-pay | Admitting: Family

## 2022-01-16 ENCOUNTER — Telehealth (INDEPENDENT_AMBULATORY_CARE_PROVIDER_SITE_OTHER): Payer: Medicare HMO | Admitting: Family

## 2022-01-16 DIAGNOSIS — I10 Essential (primary) hypertension: Secondary | ICD-10-CM | POA: Diagnosis not present

## 2022-01-16 DIAGNOSIS — E538 Deficiency of other specified B group vitamins: Secondary | ICD-10-CM

## 2022-01-16 DIAGNOSIS — K2100 Gastro-esophageal reflux disease with esophagitis, without bleeding: Secondary | ICD-10-CM | POA: Diagnosis not present

## 2022-01-16 DIAGNOSIS — M542 Cervicalgia: Secondary | ICD-10-CM

## 2022-01-16 DIAGNOSIS — M810 Age-related osteoporosis without current pathological fracture: Secondary | ICD-10-CM

## 2022-01-16 DIAGNOSIS — Z0279 Encounter for issue of other medical certificate: Secondary | ICD-10-CM

## 2022-01-17 ENCOUNTER — Other Ambulatory Visit: Payer: Self-pay | Admitting: Internal Medicine

## 2022-01-17 DIAGNOSIS — I4821 Permanent atrial fibrillation: Secondary | ICD-10-CM

## 2022-01-18 ENCOUNTER — Other Ambulatory Visit: Payer: Self-pay | Admitting: Family

## 2022-01-18 NOTE — Progress Notes (Signed)
Virtual Visit via Video Note  I connected with Megan Olsen on 01/18/22 at 11:30 AM EDT by a video enabled telemedicine application and verified that I am speaking with the correct person using two identifiers.  Visit started out as video visit. Due to connectivity issues with the patient's cell phone video was switched to telehealth Location: Patient: Home with niece Provider: Dutch Quint   I discussed the limitations of evaluation and management by telemedicine and the availability of in person appointments. The patient expressed understanding and agreed to proceed.  History of Present Illness: 88 year old female presents with her niece for form completion. Patient is planning to be relocated to an assisted living facility. She is able to dress and groom herself but needs help with medication and food preparation. She has urinary/bowel incontinence infrequently    Observations/Objective: Alert and oriented, NAD   Assessment and Plan: Megan Olsen was seen today for patient requests fl-2 form be completed.  Diagnoses and all orders for this visit:  Age related osteoporosis, unspecified pathological fracture presence  Gastroesophageal reflux disease with esophagitis without hemorrhage  B12 deficiency  Cervicalgia  Benign essential HTN      Follow Up Instructions: Paperwork completed and Megan Olsen added as attending MD until Dr. Legrand Como is able to assume care    I discussed the assessment and treatment plan with the patient. The patient was provided an opportunity to ask questions and all were answered. The patient agreed with the plan and demonstrated an understanding of the instructions.   The patient was advised to call back or seek an in-person evaluation if the symptoms worsen or if the condition fails to improve as anticipated.  I provided 25 minutes of non-face-to-face time during this encounter.   Kennyth Arnold, FNP

## 2022-01-19 ENCOUNTER — Other Ambulatory Visit: Payer: Self-pay | Admitting: Family Medicine

## 2022-01-19 DIAGNOSIS — F411 Generalized anxiety disorder: Secondary | ICD-10-CM

## 2022-01-19 MED ORDER — LORAZEPAM 1 MG PO TABS: ORAL_TABLET | ORAL | 3 refills | Status: DC

## 2022-01-19 NOTE — Telephone Encounter (Signed)
Eliquis 2.'5mg'$  refill request received. Patient is 88 years old, weight-53.6kg, Crea-0.79 on 11/07/2021 Diagnosis-Afib, and last seen by Dr. Caryl Comes on 11/07/2021. Dose is appropriate based on dosing criteria. Will send in refill to requested pharmacy.

## 2022-01-19 NOTE — Telephone Encounter (Signed)
Pt called to request that someone call [employer] and okay the refill for LORazepam (ATIVAN) 1 MG tablet  Pt's number is [phone]  Please advise.

## 2022-01-19 NOTE — Telephone Encounter (Signed)
Last refill per controlled substance database: 12/26/21 Last OV: 01/16/22 (acute reasons) Next OV: TOC 02/16/22

## 2022-01-23 ENCOUNTER — Telehealth: Payer: Self-pay | Admitting: Family Medicine

## 2022-01-23 NOTE — Telephone Encounter (Signed)
Spoke with Seth Bake and informed her I was holding onto the forms as per [facility], the patient will need to have a TB skin test placed 90-days prior to moving into the home and she was not sure when this would be done.  Seth Bake stated she will go ahead and pick up the paperwork as the patient will request a TB skin test during the upcoming appt on 7/31 with new PCP.  Seth Bake is aware the original forms were completed, except for the addendum for evaluation of communicable diseases-which the patient will bring back to upcoming appt and left at the front desk for pick up.  Charge sheet and copy given to Mrs Margarett.

## 2022-01-23 NOTE — Telephone Encounter (Signed)
Calling in to check on FL-2. Checked file cabinet, form not present. Wants to know if form was sent to facility or if she should come pick it up, please call with details

## 2022-01-26 ENCOUNTER — Ambulatory Visit: Payer: Medicare HMO | Admitting: Cardiology

## 2022-01-26 ENCOUNTER — Encounter: Payer: Self-pay | Admitting: Cardiology

## 2022-01-26 DIAGNOSIS — I4821 Permanent atrial fibrillation: Secondary | ICD-10-CM | POA: Diagnosis not present

## 2022-01-26 DIAGNOSIS — I252 Old myocardial infarction: Secondary | ICD-10-CM

## 2022-01-26 DIAGNOSIS — I10 Essential (primary) hypertension: Secondary | ICD-10-CM | POA: Diagnosis not present

## 2022-01-26 NOTE — Assessment & Plan Note (Signed)
Persistent atrial fibrillation, on dose adjusted Eliquis.  Doing well.  Hemoglobin and creatinine are stable.

## 2022-01-26 NOTE — Assessment & Plan Note (Signed)
Doing well with Dr. Olin Pia combination bisoprolol hydrochlorothiazide 5/6.25 twice daily

## 2022-01-26 NOTE — Patient Instructions (Signed)
Medication Instructions:   Your physician recommends that you continue on your current medications as directed. Please refer to the Current Medication list given to you today.  *If you need a refill on your cardiac medications before your next appointment, please call your pharmacy*   Follow-Up: At [facility], you and your health needs are our priority.  As part of our continuing mission to provide you with exceptional heart care, we have created designated Provider Care Teams.  These Care Teams include your primary Cardiologist (physician) and Advanced Practice Providers (APPs -  Physician Assistants and Nurse Practitioners) who all work together to provide you with the care you need, when you need it.  We recommend signing up for the patient portal called "MyChart".  Sign up information is provided on this After Visit Summary.  MyChart is used to connect with patients for Virtual Visits (Telemedicine).  Patients are able to view lab/test results, encounter notes, upcoming appointments, etc.  Non-urgent messages can be sent to your provider as well.   To learn more about what you can do with MyChart, go to [email or web address].    Your next appointment:   12 month(s)  The format for your next appointment:   In Person  Provider:   Mark Skains, MD    

## 2022-01-26 NOTE — Assessment & Plan Note (Signed)
Prior Takotsubo cardiomyopathy in 2020 with troponin elevation.  Technically, this was myocardial injury in the setting.  Her EF is now normal.  Previously ballooning of the apex.

## 2022-01-26 NOTE — Progress Notes (Signed)
Cardiology Office Note:    Date:  01/26/2022   ID:  Megan Olsen, DOB 11-16-1927, MRN [id number]  PCP:  Megan Macadam, MD (Inactive)   [facility] Providers Cardiologist:  Candee Furbish, MD Electrophysiologist:  [name], MD     Referring MD: Megan Macadam, MD    History of Present Illness:    Megan Olsen is a 88 y.o. female here for follow-up pacemaker, [employer].  Single-chamber.  Placed in 2019 secondary to bradycardia in the setting of permanent atrial fibrillation.  Back in 2020 she had Takotsubo, EF at that time resume to normal 55 to 65%.  At last visit with Dr. Caryl Comes on 11/07/2021 her blood pressure was elevated so bisoprolol was increased from 5-7.5.  Her losartan was continued at 25.  Eliquis was 2.5.  My last visit with her was a virtual visit during [place].  Her main worry at that time was macular degeneration.    Past Medical History:  Diagnosis Date   Age-related macular degeneration, wet, both eyes ([place])    Anxiety disorder 07/06/2014   Arthritis    "all over" (04/27/2018)   Atrial fibrillation ([place])    catheter ablation of SVT in 2002   Benign essential HTN 07/06/2014   Constipation, intermittent 08/04/2007   CVA (cerebrovascular accident) ([place]) 2003   left brain; denies residual on 04/27/2018   Depression    GERD (gastroesophageal reflux disease)    Heart murmur    hx (04/27/2018)   Hypothyroidism 09/07/2014   "off RX now" (04/27/2018)   Iron deficiency anemia    Iron deficiency anemia, unspecified 01/02/2013   Osteoarthritis    Osteoporosis 01/11/2007   Pneumonia    "one time; years and years ago" (04/27/2018)   Presence of permanent cardiac pacemaker 04/27/2018    Dual Chamber   Right BBB/left ant fasc block     Past Surgical History:  Procedure Laterality Date   APPENDECTOMY     CATARACT EXTRACTION W/ INTRAOCULAR LENS  IMPLANT, BILATERAL Bilateral    CHOLECYSTECTOMY OPEN     COLONOSCOPY     EYE SURGERY     FRACTURE SURGERY      HIP PINNING,CANNULATED Left 07/07/2014   Procedure: CANNULATED HIP PINNING;  Surgeon: Mauri Pole, MD;  Location: [facility];  Service: Orthopedics;  Laterality: Left;   INSERT / REPLACE / REMOVE PACEMAKER  04/27/2018    Dual Chamber   JOINT REPLACEMENT     LAPAROSCOPIC OVARIAN CYSTECTOMY     LEFT HEART CATH AND CORONARY ANGIOGRAPHY N/A 01/31/2019   Procedure: LEFT HEART CATH AND CORONARY ANGIOGRAPHY;  Surgeon: Leonie Man, MD;  Location: [facility];  Service: Cardiovascular;  Laterality: N/A;   NASAL SEPTUM SURGERY     PACEMAKER IMPLANT N/A 04/27/2018   Procedure: PACEMAKER IMPLANT - Dual Chamber;  Surgeon: Deboraha Sprang, MD;  Location: [facility];  Service: Cardiovascular;  Laterality: N/A;   SVT ABLATION  2002   TONSILLECTOMY     TOTAL ABDOMINAL HYSTERECTOMY     TOTAL HIP ARTHROPLASTY Right 02/23/2017   Procedure: RIGHT TOTAL HIP ARTHROPLASTY ANTERIOR APPROACH;  Surgeon: Paralee Cancel, MD;  Location: [facility];  Service: Orthopedics;  Laterality: Right;   VITRECTOMY Right 2008   dr Zadie Rhine.  Vitrectomy and removal of tissue.     Current Medications: Current Meds  Medication Sig   acetaminophen (TYLENOL) 650 MG CR tablet Take 650 mg by mouth every 8 (eight) hours as needed for pain.  apixaban (ELIQUIS) 2.5 MG TABS tablet TAKE 1 TABLET TWICE DAILY   bisoprolol-hydrochlorothiazide (ZIAC) 5-6.25 MG tablet Take 1 tablet by mouth 2 (two) times daily.   dexlansoprazole (DEXILANT) 60 MG capsule TAKE 1 CAPSULE EVERY DAY   fluticasone (FLONASE) 50 MCG/ACT nasal spray PLACE 2 SPRAYS INTO BOTH NOSTRILS DAILY. (Patient taking differently: Place 2 sprays into both nostrils as needed.)   LORazepam (ATIVAN) 1 MG tablet TAKE 1 TABLET BY MOUTH AT BEDTIME AS NEEDED FOR ANXIETY OR SLEEP   losartan (COZAAR) 25 MG tablet Take 1 tablet (25 mg total) by mouth daily.   Multiple Vitamins-Minerals (PRESERVISION AREDS 2) CAPS Take 1 capsule by mouth 2 (two) times daily.   nitroGLYCERIN  (NITROSTAT) 0.4 MG SL tablet Place 1 tablet (0.4 mg total) under the tongue every 5 (five) minutes x 3 doses as needed for chest pain.   NON FORMULARY Place 1 Dose into both eyes See admin instructions. Receives injections into both eyes every 5-8 weeks at Dr. Serita Grit office [facility])   traZODone (DESYREL) 100 MG tablet Take 1 tablet (100 mg total) by mouth at bedtime.     Allergies:   Propoxyphene n-acetaminophen, Celecoxib, Fexofenadine, and Penicillins   Social History   Socioeconomic History   Marital status: Single    Spouse name: Not on file   Number of children: Not on file   Years of education: Not on file   Highest education level: Not on file  Occupational History   Not on file  Tobacco Use   Smoking status: Never    Passive exposure: Never   Smokeless tobacco: Never  Vaping Use   Vaping Use: Never used  Substance and Sexual Activity   Alcohol use: Not Currently    Comment: occasionally   Drug use: Never   Sexual activity: Not Currently  Other Topics Concern   Not on file  Social History Narrative   Not on file   Social Determinants of Health   Financial Resource Strain: Low Risk  (09/02/2021)   Overall Financial Resource Strain (CARDIA)    Difficulty of Paying Living Expenses: Not hard at all  Food Insecurity: No Food Insecurity (09/02/2021)   Hunger Vital Sign    Worried About Running Out of Food in the Last Year: Never true    [place] in the Last Year: Never true  Transportation Needs: No Transportation Needs (09/02/2021)   PRAPARE - Hydrologist (Medical): No    Lack of Transportation (Non-Medical): No  Physical Activity: Inactive (09/02/2021)   Exercise Vital Sign    Days of Exercise per Week: 0 days    Minutes of Exercise per Session: 0 min  Stress: No Stress Concern Present (09/02/2021)   [place]    Feeling of Stress : Only a little   Social Connections: Moderately Isolated (09/26/2020)   Social Connection and Isolation Panel [NHANES]    Frequency of Communication with Friends and Family: More than three times a week    Frequency of Social Gatherings with Friends and Family: More than three times a week    Attends Religious Services: Never    Marine scientist or Organizations: Yes    Attends Archivist Meetings: Never    Marital Status: Never married     Family History: The patient's family history includes Dementia in her sister and sister; Early death in her mother; Heart attack in her brother and  father; Heart disease in her sister; Kidney disease in her mother; Nephritis in her mother.  ROS:   Please see the history of present illness.     All other systems reviewed and are negative.  EKGs/Labs/Other Studies Reviewed:    The following studies were reviewed today:  Recent Labs: 11/07/2021: BUN 20; Creatinine, Ser 0.79; Hemoglobin 12.2; Platelets 160; Potassium 4.2; Sodium 140  Recent Lipid Panel    Component Value Date/Time   CHOL 146 08/09/2019 1104   TRIG 92.0 08/09/2019 1104   HDL 65.60 08/09/2019 1104   CHOLHDL 2 08/09/2019 1104   VLDL 18.4 08/09/2019 1104   LDLCALC 62 08/09/2019 1104     Risk Assessment/Calculations:              Physical Exam:    VS:  BP 140/70 (BP Location: Left Arm, Patient Position: Sitting, Cuff Size: Normal)   Pulse 65   Ht '5\' 3"'$  (1.6 m)   Wt 115 lb (52.2 kg)   SpO2 94%   BMI 20.37 kg/m     Wt Readings from Last 3 Encounters:  01/26/22 115 lb (52.2 kg)  11/07/21 118 lb 3.2 oz (53.6 kg)  09/02/21 114 lb (51.7 kg)     GEN:  Well nourished, well developed in no acute distress HEENT: Normal NECK: No JVD; No carotid bruits LYMPHATICS: No lymphadenopathy CARDIAC: RRR, no murmurs, no rubs, gallops RESPIRATORY:  Clear to auscultation without rales, wheezing or rhonchi  ABDOMEN: Soft, non-tender, non-distended MUSCULOSKELETAL:  No edema; No  deformity  SKIN: Warm and dry NEUROLOGIC:  Alert and oriented x 3 PSYCHIATRIC:  Normal affect   ASSESSMENT:    1. History of myocardial infarction   2. Permanent atrial fibrillation ([place])   3. Benign essential HTN    PLAN:    In order of problems listed above:  History of myocardial infarction Prior Takotsubo cardiomyopathy in 2020 with troponin elevation.  Technically, this was myocardial injury in the setting.  Her EF is now normal.  Previously ballooning of the apex.  A-fib [facility]), s/p catheter ablation of SVT in 2002 Persistent atrial fibrillation, on dose adjusted Eliquis.  Doing well.  Hemoglobin and creatinine are stable.  Benign essential HTN Doing well with Dr. Olin Pia combination bisoprolol hydrochlorothiazide 5/6.25 twice daily     CVA history  - Eliquis, seems to be doing well.   Mitral regurgitation -Continue to monitor clinically and with echocardiogram.  Moderate in intensity.      Medication Adjustments/Labs and Tests Ordered: Current medicines are reviewed at length with the patient today.  Concerns regarding medicines are outlined above.  No orders of the defined types were placed in this encounter.  No orders of the defined types were placed in this encounter.   Patient Instructions  Medication Instructions:  Your physician recommends that you continue on your current medications as directed. Please refer to the Current Medication list given to you today.  *If you need a refill on your cardiac medications before your next appointment, please call your pharmacy*  Follow-Up: At [facility], you and your health needs are our priority.  As part of our continuing mission to provide you with exceptional heart care, we have created designated Provider Care Teams.  These Care Teams include your primary Cardiologist (physician) and Advanced Practice Providers (APPs -  Physician Assistants and Nurse Practitioners) who all work together to provide you with  the care you need, when you need it.  We recommend signing up for the patient portal  called "MyChart".  Sign up information is provided on this After Visit Summary.  MyChart is used to connect with patients for Virtual Visits (Telemedicine).  Patients are able to view lab/test results, encounter notes, upcoming appointments, etc.  Non-urgent messages can be sent to your provider as well.   To learn more about what you can do with MyChart, go to [email or web address].    Your next appointment:   12 month(s)  The format for your next appointment:   In Person  Provider:   Candee Furbish, MD            Signed, Candee Furbish, MD  01/26/2022 4:13 PM    [facility]

## 2022-01-28 ENCOUNTER — Ambulatory Visit (INDEPENDENT_AMBULATORY_CARE_PROVIDER_SITE_OTHER): Payer: Medicare HMO

## 2022-01-28 DIAGNOSIS — I495 Sick sinus syndrome: Secondary | ICD-10-CM | POA: Diagnosis not present

## 2022-01-28 LAB — CUP PACEART REMOTE DEVICE CHECK
Battery Remaining Longevity: 92 mo
Battery Remaining Percentage: 75 %
Battery Voltage: 3.01 V
Brady Statistic RV Percent Paced: 92 %
Date Time Interrogation Session: 20230712020014
Implantable Lead Implant Date: 20191009
Implantable Lead Location: 753860
Implantable Lead Model: 1948
Implantable Pulse Generator Implant Date: 20191009
Lead Channel Impedance Value: 640 Ohm
Lead Channel Pacing Threshold Amplitude: 0.5 V
Lead Channel Pacing Threshold Pulse Width: 0.5 ms
Lead Channel Sensing Intrinsic Amplitude: 12 mV
Lead Channel Setting Pacing Amplitude: 2.5 V
Lead Channel Setting Pacing Pulse Width: 0.5 ms
Lead Channel Setting Sensing Sensitivity: 2 mV
Pulse Gen Model: 1272
Pulse Gen Serial Number: 9067558

## 2022-02-16 ENCOUNTER — Encounter: Payer: Self-pay | Admitting: Family Medicine

## 2022-02-16 ENCOUNTER — Ambulatory Visit (INDEPENDENT_AMBULATORY_CARE_PROVIDER_SITE_OTHER): Payer: Medicare HMO | Admitting: Family Medicine

## 2022-02-16 VITALS — BP 132/70 | HR 65 | Temp 97.5°F | Ht 63.0 in | Wt 115.3 lb

## 2022-02-16 DIAGNOSIS — F411 Generalized anxiety disorder: Secondary | ICD-10-CM

## 2022-02-16 DIAGNOSIS — Z117 Encounter for testing for latent tuberculosis infection: Secondary | ICD-10-CM

## 2022-02-16 DIAGNOSIS — I10 Essential (primary) hypertension: Secondary | ICD-10-CM

## 2022-02-16 DIAGNOSIS — Z7189 Other specified counseling: Secondary | ICD-10-CM | POA: Insufficient documentation

## 2022-02-16 NOTE — Progress Notes (Signed)
Established Patient Office Visit  Subjective   Patient ID: Megan Olsen, female    DOB: 1927/09/25  Age: 88 y.o. MRN: [id number]  Chief Complaint  Patient presents with   Establish Care    #Hypertension Patient reports a long history of HTN. Current hypertension medications:      Sig   bisoprolol-hydrochlorothiazide (ZIAC) 5-6.25 MG tablet (Taking) Take 1  tablet by mouth 2 (two) times daily.   losartan (COZAAR) 25 MG tablet (Taking) Take 1 tablet (25 mg total) by  mouth daily.   States she is tolerating them well, no dizziness or side effects. Reports no chest pain or dyspnea. She continues to follow with her cardiologist and EP specialist for her atrial fibrillation. She has a pacemaker also.  The following portions of the patient's history were reviewed and updated as appropriate: current medications, past medical history, past social history, and problem list.    Patient is considering going to live in a nursing home,  She has brought paperwork to fill out this visit and she reports she needs a quantiferon gold TB test before she goes to the nursing home. Pt reports she needs a TB test today for screening purposes.   I reviewed the patient's medications with her. She continues to take ativan 1 mg daily at bedtime. States she has a lot of trouble sleeping due to her anxiety, has difficulty turning off her thoughts at night in order to settle down. We had a long discussion about the potential complications of being on benzodiazepines long term - including increasing the risk of dementia, falls and disorientation. Pt states she has been on them for years and she has not had any previous side effects to the medication.       Current Outpatient Medications  Medication Instructions   acetaminophen (TYLENOL) 650 mg, Oral, Every 8 hours PRN   apixaban (ELIQUIS) 2.5 MG TABS tablet TAKE 1 TABLET TWICE DAILY   bisoprolol-hydrochlorothiazide (ZIAC) 5-6.25 MG tablet 1 tablet, Oral, 2  times daily   dexlansoprazole (DEXILANT) 60 MG capsule TAKE 1 CAPSULE EVERY DAY   fluticasone (FLONASE) 50 MCG/ACT nasal spray 2 sprays, Each Nare, Daily   LORazepam (ATIVAN) 1 MG tablet TAKE 1 TABLET BY MOUTH AT BEDTIME AS NEEDED FOR ANXIETY OR SLEEP   losartan (COZAAR) 25 mg, Oral, Daily   Multiple Vitamins-Minerals (PRESERVISION AREDS 2) CAPS 1 capsule, Oral, 2 times daily   nitroGLYCERIN (NITROSTAT) 0.4 mg, Sublingual, Every 5 min x3 PRN   NON FORMULARY 1 Dose, Both Eyes, See admin instructions, Receives injections into both eyes every 5-8 weeks at Dr. Serita Grit office [facility])   traZODone (DESYREL) 100 mg, Oral, Daily at bedtime     Review of Systems  All other systems reviewed and are negative.     Objective:     BP 132/70 (BP Location: Right Arm, Cuff Size: Normal)   Pulse 65   Temp (!) 97.5 F (36.4 C) (Oral)   Ht '5\' 3"'$  (1.6 m)   Wt 115 lb 4.8 oz (52.3 kg)   SpO2 95%   BMI 20.42 kg/m  BP Readings from Last 3 Encounters:  02/16/22 132/70  01/26/22 140/70  11/07/21 (!) 146/76      Physical Exam Vitals reviewed.  Constitutional:      Appearance: Normal appearance. She is well-groomed and normal weight.  HENT:     Head: Normocephalic and atraumatic.  Eyes:     Extraocular Movements: Extraocular movements intact.     Pupils:  Pupils are equal, round, and reactive to light.  Cardiovascular:     Rate and Rhythm: Normal rate and regular rhythm.     Heart sounds: S1 normal and S2 normal.  Pulmonary:     Effort: Pulmonary effort is normal.     Breath sounds: Normal breath sounds and air entry.  Musculoskeletal:        General: Normal range of motion.     Cervical back: Normal range of motion and neck supple.     Right lower leg: No edema.     Left lower leg: No edema.  Skin:    General: Skin is warm and dry.  Neurological:     General: No focal deficit present.     Mental Status: She is alert and oriented to person, place, and time. Mental  status is at baseline.     Gait: Gait is intact.  Psychiatric:        Mood and Affect: Mood and affect normal.        Speech: Speech normal.        Behavior: Behavior normal.      No results found for any visits on 02/16/22.  Last lipids Lab Results  Component Value Date   CHOL 146 08/09/2019   HDL 65.60 08/09/2019   LDLCALC 62 08/09/2019   TRIG 92.0 08/09/2019   CHOLHDL 2 08/09/2019      The ASCVD Risk score (Arnett DK, et al., 2019) failed to calculate for the following reasons:   The 2019 ASCVD risk score is only valid for ages 69 to 49   The patient has a prior MI or stroke diagnosis    Assessment & Plan:   Problem List Items Addressed This Visit       Cardiovascular and Mediastinum   Benign essential HTN - Primary (Chronic)    BP is well controlled today on Ziac 5/6.25 mg and losartan 25 mg daily. Recommend continuing current medications.         Other   Encounter for testing for latent tuberculosis    Patient brought paperwork for me to sign for possible entry into a nursing home to live. I reviewed and signed the paperwork, copies placed in chart. I have ordered a screening test for TB per the patient's request.      Relevant Orders   QuantiFERON-TB Gold Plus   Anxiety disorder (Chronic)    Patient continues on both trazodone 100 mg and ativan 1 mg at bedtime. I reviewed the risks of continuing long term benzos with the patient and I offered alternative therapies to help with her anxiety. Patient wishes to remain on the mediation for now.        Return in about 1 year (around 02/17/2023) for annual health preventative visit.    Farrel Conners, MD

## 2022-02-16 NOTE — Assessment & Plan Note (Signed)
Patient brought paperwork for me to sign for possible entry into a nursing home to live. I reviewed and signed the paperwork, copies placed in chart. I have ordered a screening test for TB per the patient's request.

## 2022-02-16 NOTE — Assessment & Plan Note (Signed)
Patient continues on both trazodone 100 mg and ativan 1 mg at bedtime. I reviewed the risks of continuing long term benzos with the patient and I offered alternative therapies to help with her anxiety. Patient wishes to remain on the mediation for now.

## 2022-02-16 NOTE — Assessment & Plan Note (Signed)
BP is well controlled today on Ziac 5/6.25 mg and losartan 25 mg daily. Recommend continuing current medications.

## 2022-02-17 NOTE — Progress Notes (Signed)
Remote pacemaker transmission.   

## 2022-02-18 LAB — QUANTIFERON-TB GOLD PLUS
Mitogen-NIL: 8.92 IU/mL
NIL: 0.01 IU/mL
QuantiFERON-TB Gold Plus: NEGATIVE
TB1-NIL: 0.01 IU/mL
TB2-NIL: 0.02 IU/mL

## 2022-02-19 ENCOUNTER — Encounter: Payer: Self-pay | Admitting: *Deleted

## 2022-03-13 ENCOUNTER — Other Ambulatory Visit: Payer: Self-pay

## 2022-03-13 ENCOUNTER — Emergency Department (HOSPITAL_COMMUNITY): Payer: Medicare HMO

## 2022-03-13 ENCOUNTER — Inpatient Hospital Stay (HOSPITAL_COMMUNITY)
Admission: EM | Admit: 2022-03-13 | Discharge: 2022-03-17 | DRG: 605 | Disposition: A | Discharge status: Skilled Nursing Facility | Payer: Medicare HMO | Attending: Internal Medicine | Admitting: Internal Medicine

## 2022-03-13 ENCOUNTER — Encounter (HOSPITAL_COMMUNITY): Payer: Self-pay | Admitting: Emergency Medicine

## 2022-03-13 DIAGNOSIS — W1830XA Fall on same level, unspecified, initial encounter: Secondary | ICD-10-CM | POA: Diagnosis present

## 2022-03-13 DIAGNOSIS — M6282 Rhabdomyolysis: Secondary | ICD-10-CM | POA: Diagnosis present

## 2022-03-13 DIAGNOSIS — R531 Weakness: Secondary | ICD-10-CM

## 2022-03-13 DIAGNOSIS — E039 Hypothyroidism, unspecified: Secondary | ICD-10-CM | POA: Diagnosis present

## 2022-03-13 DIAGNOSIS — W19XXXA Unspecified fall, initial encounter: Secondary | ICD-10-CM | POA: Diagnosis not present

## 2022-03-13 DIAGNOSIS — Z66 Do not resuscitate: Secondary | ICD-10-CM | POA: Diagnosis not present

## 2022-03-13 DIAGNOSIS — Z888 Allergy status to other drugs, medicaments and biological substances status: Secondary | ICD-10-CM

## 2022-03-13 DIAGNOSIS — S5011XA Contusion of right forearm, initial encounter: Secondary | ICD-10-CM | POA: Diagnosis not present

## 2022-03-13 DIAGNOSIS — I495 Sick sinus syndrome: Secondary | ICD-10-CM | POA: Diagnosis not present

## 2022-03-13 DIAGNOSIS — Z8249 Family history of ischemic heart disease and other diseases of the circulatory system: Secondary | ICD-10-CM

## 2022-03-13 DIAGNOSIS — F0394 Unspecified dementia, unspecified severity, with anxiety: Secondary | ICD-10-CM | POA: Diagnosis not present

## 2022-03-13 DIAGNOSIS — M810 Age-related osteoporosis without current pathological fracture: Secondary | ICD-10-CM | POA: Diagnosis not present

## 2022-03-13 DIAGNOSIS — E441 Mild protein-calorie malnutrition: Secondary | ICD-10-CM | POA: Diagnosis present

## 2022-03-13 DIAGNOSIS — H353230 Exudative age-related macular degeneration, bilateral, stage unspecified: Secondary | ICD-10-CM | POA: Diagnosis present

## 2022-03-13 DIAGNOSIS — F411 Generalized anxiety disorder: Secondary | ICD-10-CM | POA: Diagnosis present

## 2022-03-13 DIAGNOSIS — S0990XA Unspecified injury of head, initial encounter: Secondary | ICD-10-CM

## 2022-03-13 DIAGNOSIS — Z950 Presence of cardiac pacemaker: Secondary | ICD-10-CM

## 2022-03-13 DIAGNOSIS — D509 Iron deficiency anemia, unspecified: Secondary | ICD-10-CM | POA: Diagnosis not present

## 2022-03-13 DIAGNOSIS — S0003XA Contusion of scalp, initial encounter: Secondary | ICD-10-CM | POA: Diagnosis not present

## 2022-03-13 DIAGNOSIS — I10 Essential (primary) hypertension: Secondary | ICD-10-CM | POA: Diagnosis not present

## 2022-03-13 DIAGNOSIS — Y92009 Unspecified place in unspecified non-institutional (private) residence as the place of occurrence of the external cause: Secondary | ICD-10-CM

## 2022-03-13 DIAGNOSIS — M6259 Muscle wasting and atrophy, not elsewhere classified, multiple sites: Secondary | ICD-10-CM | POA: Diagnosis not present

## 2022-03-13 DIAGNOSIS — Z880 Allergy status to penicillin: Secondary | ICD-10-CM

## 2022-03-13 DIAGNOSIS — Z90710 Acquired absence of both cervix and uterus: Secondary | ICD-10-CM

## 2022-03-13 DIAGNOSIS — D696 Thrombocytopenia, unspecified: Secondary | ICD-10-CM | POA: Diagnosis not present

## 2022-03-13 DIAGNOSIS — Z79899 Other long term (current) drug therapy: Secondary | ICD-10-CM

## 2022-03-13 DIAGNOSIS — R609 Edema, unspecified: Secondary | ICD-10-CM | POA: Diagnosis not present

## 2022-03-13 DIAGNOSIS — Z681 Body mass index (BMI) 19 or less, adult: Secondary | ICD-10-CM | POA: Diagnosis not present

## 2022-03-13 DIAGNOSIS — I471 Supraventricular tachycardia: Secondary | ICD-10-CM | POA: Diagnosis not present

## 2022-03-13 DIAGNOSIS — F32A Depression, unspecified: Secondary | ICD-10-CM | POA: Diagnosis present

## 2022-03-13 DIAGNOSIS — I517 Cardiomegaly: Secondary | ICD-10-CM | POA: Diagnosis not present

## 2022-03-13 DIAGNOSIS — T502X5A Adverse effect of carbonic-anhydrase inhibitors, benzothiadiazides and other diuretics, initial encounter: Secondary | ICD-10-CM | POA: Diagnosis present

## 2022-03-13 DIAGNOSIS — R748 Abnormal levels of other serum enzymes: Secondary | ICD-10-CM | POA: Diagnosis present

## 2022-03-13 DIAGNOSIS — Z781 Physical restraint status: Secondary | ICD-10-CM

## 2022-03-13 DIAGNOSIS — Z8673 Personal history of transient ischemic attack (TIA), and cerebral infarction without residual deficits: Secondary | ICD-10-CM

## 2022-03-13 DIAGNOSIS — R55 Syncope and collapse: Secondary | ICD-10-CM | POA: Diagnosis not present

## 2022-03-13 DIAGNOSIS — Z96651 Presence of right artificial knee joint: Secondary | ICD-10-CM | POA: Diagnosis present

## 2022-03-13 DIAGNOSIS — K219 Gastro-esophageal reflux disease without esophagitis: Secondary | ICD-10-CM | POA: Diagnosis present

## 2022-03-13 DIAGNOSIS — Z7901 Long term (current) use of anticoagulants: Secondary | ICD-10-CM

## 2022-03-13 DIAGNOSIS — E876 Hypokalemia: Secondary | ICD-10-CM | POA: Diagnosis present

## 2022-03-13 DIAGNOSIS — I480 Paroxysmal atrial fibrillation: Secondary | ICD-10-CM | POA: Diagnosis present

## 2022-03-13 DIAGNOSIS — F419 Anxiety disorder, unspecified: Secondary | ICD-10-CM | POA: Diagnosis present

## 2022-03-13 DIAGNOSIS — I4891 Unspecified atrial fibrillation: Secondary | ICD-10-CM | POA: Diagnosis present

## 2022-03-13 DIAGNOSIS — S3993XA Unspecified injury of pelvis, initial encounter: Secondary | ICD-10-CM | POA: Diagnosis not present

## 2022-03-13 DIAGNOSIS — I4510 Unspecified right bundle-branch block: Secondary | ICD-10-CM | POA: Diagnosis present

## 2022-03-13 DIAGNOSIS — I119 Hypertensive heart disease without heart failure: Secondary | ICD-10-CM | POA: Diagnosis not present

## 2022-03-13 DIAGNOSIS — G4489 Other headache syndrome: Secondary | ICD-10-CM | POA: Diagnosis not present

## 2022-03-13 DIAGNOSIS — S299XXA Unspecified injury of thorax, initial encounter: Secondary | ICD-10-CM | POA: Diagnosis not present

## 2022-03-13 DIAGNOSIS — M4312 Spondylolisthesis, cervical region: Secondary | ICD-10-CM | POA: Diagnosis not present

## 2022-03-13 LAB — CBC WITH DIFFERENTIAL/PLATELET
Abs Immature Granulocytes: 0.02 10*3/uL (ref 0.00–0.07)
Basophils Absolute: 0 10*3/uL (ref 0.0–0.1)
Basophils Relative: 0 %
Eosinophils Absolute: 0.1 10*3/uL (ref 0.0–0.5)
Eosinophils Relative: 1 %
HCT: 34.3 % — ABNORMAL LOW (ref 36.0–46.0)
Hemoglobin: 11.5 g/dL — ABNORMAL LOW (ref 12.0–15.0)
Immature Granulocytes: 0 %
Lymphocytes Relative: 4 %
Lymphs Abs: 0.3 10*3/uL — ABNORMAL LOW (ref 0.7–4.0)
MCH: 31.3 pg (ref 26.0–34.0)
MCHC: 33.5 g/dL (ref 30.0–36.0)
MCV: 93.5 fL (ref 80.0–100.0)
Monocytes Absolute: 0.7 10*3/uL (ref 0.1–1.0)
Monocytes Relative: 10 %
Neutro Abs: 6.1 10*3/uL (ref 1.7–7.7)
Neutrophils Relative %: 85 %
Platelets: 110 10*3/uL — ABNORMAL LOW (ref 150–400)
RBC: 3.67 MIL/uL — ABNORMAL LOW (ref 3.87–5.11)
RDW: 13.1 % (ref 11.5–15.5)
WBC: 7.2 10*3/uL (ref 4.0–10.5)
nRBC: 0 % (ref 0.0–0.2)

## 2022-03-13 LAB — URINALYSIS, ROUTINE W REFLEX MICROSCOPIC
Bacteria, UA: NONE SEEN
Bilirubin Urine: NEGATIVE
Glucose, UA: NEGATIVE mg/dL
Ketones, ur: NEGATIVE mg/dL
Leukocytes,Ua: NEGATIVE
Nitrite: NEGATIVE
Protein, ur: NEGATIVE mg/dL
Specific Gravity, Urine: 1.009 (ref 1.005–1.030)
pH: 6 (ref 5.0–8.0)

## 2022-03-13 LAB — MAGNESIUM: Magnesium: 1.6 mg/dL — ABNORMAL LOW (ref 1.7–2.4)

## 2022-03-13 LAB — COMPREHENSIVE METABOLIC PANEL
ALT: 30 U/L (ref 0–44)
AST: 43 U/L — ABNORMAL HIGH (ref 15–41)
Albumin: 3.3 g/dL — ABNORMAL LOW (ref 3.5–5.0)
Alkaline Phosphatase: 105 U/L (ref 38–126)
Anion gap: 9 (ref 5–15)
BUN: 13 mg/dL (ref 8–23)
CO2: 25 mmol/L (ref 22–32)
Calcium: 8.7 mg/dL — ABNORMAL LOW (ref 8.9–10.3)
Chloride: 101 mmol/L (ref 98–111)
Creatinine, Ser: 0.77 mg/dL (ref 0.44–1.00)
GFR, Estimated: >60 mL/min (ref >60)
Glucose, Bld: 110 mg/dL — ABNORMAL HIGH (ref 70–99)
Potassium: 4 mmol/L (ref 3.5–5.1)
Sodium: 135 mmol/L (ref 135–145)
Total Bilirubin: 0.6 mg/dL (ref 0.3–1.2)
Total Protein: 5.7 g/dL — ABNORMAL LOW (ref 6.5–8.1)

## 2022-03-13 LAB — CK: Total CK: 759 U/L — ABNORMAL HIGH (ref 38–234)

## 2022-03-13 MED ORDER — ACETAMINOPHEN 325 MG PO TABS
650.0000 mg | ORAL_TABLET | Freq: Four times a day (QID) | ORAL | Status: DC | PRN
  Administered 2022-03-14 – 2022-03-15 (×2): 650 mg via ORAL
  Filled 2022-03-13 (×2): qty 2

## 2022-03-13 MED ORDER — ACETAMINOPHEN 650 MG RE SUPP: 650.0000 mg | Freq: Four times a day (QID) | RECTAL | Status: DC | PRN

## 2022-03-13 MED ORDER — HYDRALAZINE HCL 20 MG/ML IJ SOLN
10.0000 mg | INTRAMUSCULAR | Status: DC | PRN
  Filled 2022-03-13: qty 1

## 2022-03-13 NOTE — ED Provider Notes (Signed)
  Fell on [facility]. Cleared from trauma standpoint. Has been weakening overall, dementia.  PT seeing her about skilled nursing placement. FL-2 completed. Transition of care is following. Family already had place they were trying to send her before that.   If PT says skilled nursing, then maybe could get in today.  If PT says home health, home health face-to-face   Physical Exam  BP (!) 174/77   Pulse 65   Temp 97.8 F (36.6 C) (Oral)   Resp 19   Ht '5\' 3"'$  (1.6 m)   Wt 49.9 kg   SpO2 99%   BMI 19.49 kg/m   Physical Exam  Procedures  Procedures  ED Course / MDM    Medical Decision Making Amount and/or Complexity of Data Reviewed Labs: ordered. Radiology: ordered.   ***

## 2022-03-13 NOTE — ED Notes (Signed)
Purple DNR bractlet applied to pt's RIGHT wrist Yellow FALL RISK bractlet remains on RIGHT wrist

## 2022-03-13 NOTE — ED Notes (Signed)
Verbal order from Rhetta Mura, MD to d/c non-violent restraint order and safety observation orders at this timw

## 2022-03-13 NOTE — ED Notes (Signed)
[employer] representative called about interrogation of pacemaker.  Ventricular pacer that showed no episodes.  Report being faxed over.  MD aware.  No new orders at this time.  Awaiting for CT to call this RN for transport to available scanner.  Spoke to Aurelia, [employer].

## 2022-03-13 NOTE — ED Notes (Signed)
PT at bedside.

## 2022-03-13 NOTE — ED Notes (Signed)
Pt has an inpatient bed assigned, waiting on a RN to be assigned for pt so report can be handed off to receiving RN.

## 2022-03-13 NOTE — ED Notes (Signed)
Pt changed and new linen change after using bedpan.  Pt is now dry and clean.  Comfortable watching TV call light within reach.  Stretcher in lowest position.  Warm blankets given.

## 2022-03-13 NOTE — ED Notes (Signed)
Bed alarm pad placed under pt for safety measures, the alarm is on and in appropriate position

## 2022-03-13 NOTE — TOC Initial Note (Signed)
Transition of Care [facility]) - Initial/Assessment Note    Patient Details  Name: Megan Olsen MRN: [id number] Date of Birth: 12-18-1927  [facility]) CM/SW Contact:    Raina Mina, [place] Phone Number: 03/13/2022, 3:38 PM  Clinical Narrative:  CSW spoke with patients niece who stated she is working on getting patient into [facility] for assisted living. Ms. Lacinda Axon stated they do not have a bed at this time but they have turned in the [facility] form to the facility. Ms. Lacinda Axon stated she would like rehab for patient prior to assisted living. CSW will start bed search.                 Expected Discharge Plan: Skilled Nursing Facility Barriers to Discharge: Continued Medical Work up   Patient Goals and CMS Choice Patient states their goals for this hospitalization and ongoing recovery are:: Transition to assisted living after SNF      Expected Discharge Plan and Services Expected Discharge Plan: [place]       Living arrangements for the past 2 months: Single Family Home                                      Prior Living Arrangements/Services Living arrangements for the past 2 months: Single Family Home Lives with:: Self Patient language and need for interpreter reviewed:: Yes Do you feel safe going back to the place where you live?: Yes      Need for Family Participation in Patient Care: Yes (Comment) Care giver support system in place?: Yes (comment)   Criminal Activity/Legal Involvement Pertinent to Current Situation/Hospitalization: No - Comment as needed  Activities of Daily Living      Permission Sought/Granted   Permission granted to share information with : Yes, Verbal Permission Granted  Share Information with NAME: Kaylyn Layer     Permission granted to share info w Relationship: Niece  Permission granted to share info w Contact Information: [phone]  Emotional Assessment Appearance:: Appears stated  age Attitude/Demeanor/Rapport: Engaged Affect (typically observed): Accepting Orientation: : Oriented to Self, Oriented to Place, Oriented to  Time, Oriented to Situation Alcohol / Substance Use: Not Applicable Psych Involvement: No (comment)  Admission diagnosis:  fall on thinners Patient Active Problem List   Diagnosis Date Noted   Encounter for testing for latent tuberculosis 02/16/2022   History of myocardial infarction 01/26/2022   NSTEMI (non-ST elevated myocardial infarction) ([place]) 01/30/2019   B12 deficiency 08/22/2018   Facet arthritis, degenerative, cervical spine 08/22/2018   Tachy-brady syndrome ([place]) 04/27/2018   Cervical spondylosis without myelopathy 03/11/2018   Myofascial pain syndrome 03/11/2018   Cervicalgia 03/11/2018   Insomnia 01/26/2018   High risk medication use 01/26/2018   Macular degeneration of both eyes 01/26/2018   S/P right THA, AA 02/23/2017   A-fib ([place]), s/p catheter ablation of SVT in 2002 07/06/2014   History of fracture of left hip 07/06/2014   Benign essential HTN 07/06/2014   GERD (gastroesophageal reflux disease) 07/06/2014   Anxiety disorder 07/06/2014   History of CVA (cerebrovascular accident), without residual effects 07/06/2014   Iron deficiency anemia 01/02/2013   Constipation, intermittent 08/04/2007   Depression 01/11/2007   Allergic rhinitis 01/11/2007   Osteoarthritis 01/11/2007   Osteoporosis 01/11/2007   PCP:  Farrel Conners, MD Pharmacy:   [id number] [place], [place] [address] [place] [place] [address] Phone: [phone] Fax: [phone]     Social Determinants of Health (SDOH) Interventions    Readmission Risk Interventions     No data to display

## 2022-03-13 NOTE — ED Notes (Signed)
Receiving RN Tilden Fossa, RN has agreed to accept [facility] once pt has arrived to inpatient unit, all questions and concerns address.

## 2022-03-13 NOTE — ED Notes (Signed)
Pt out of bed, urinated in floor, pt gown was changed, new socks applied, pt was placed in a diaper and purwick reapplied with assistance of [place], RN. Pt was assisted back into bed, side rails up x2 for safety, door open to help with monitoring pt, pt educated regarding fall risk and staying in bed for safety, pt verbalized understanding. Reminded pt use of call bell, call bell within reach for pt.

## 2022-03-13 NOTE — Progress Notes (Signed)
Orthopedic Tech Progress Note Patient Details:  [name] 1927/09/11 [id number]  Patient ID: Megan Olsen, female   DOB: 08-Sep-1927, 88 y.o.   MRN: [id number] Level II; not currently needed. Vernona Rieger 03/13/2022, 12:43 PM

## 2022-03-13 NOTE — Progress Notes (Signed)
SNF bed search started.  

## 2022-03-13 NOTE — ED Notes (Signed)
5W charge RN stated they will need to reevaluate inpatient bed status d/t fall risk and that pt may need sitter and restraints that are currently order d/t pt getting out of bed and safety concerns.

## 2022-03-13 NOTE — ED Notes (Signed)
Restraints withheld at this time, will apply if pt gets up our of bed again w/o assitance

## 2022-03-13 NOTE — ED Notes (Signed)
Secure message sent to receiving RN Tilden Fossa , awaiting response. Pt has an inpatient bed at this time.

## 2022-03-13 NOTE — Evaluation (Signed)
Physical Therapy Evaluation Patient Details Name: Megan Olsen MRN: [id number] DOB: [birth year] Today's Date: 03/13/2022  History of Present Illness  Pt is a 88 y/o female presenting  to the ED secondary to fall. PMH includes CVA, dementia, and pacemaker.  Clinical Impression  Pt presenting secondary to problem above with deficits below. Pt requiring min to mod A to stand and take a few steps at EOB. Very unsteady throughout. Pt normally able to ambulate with use of cane. Recommending SNF level therapies at d/c to address current deficits. Will continue to follow acutely.        Recommendations for follow up therapy are one component of a multi-disciplinary discharge planning process, led by the attending physician.  Recommendations may be updated based on patient status, additional functional criteria and insurance authorization.  Follow Up Recommendations Skilled nursing-short term rehab (<3 hours/day) Can patient physically be transported by private vehicle: No    Assistance Recommended at Discharge Frequent or constant Supervision/Assistance  Patient can return home with the following  A lot of help with walking and/or transfers;A lot of help with bathing/dressing/bathroom;Assistance with cooking/housework;Direct supervision/assist for financial management;Direct supervision/assist for medications management;Assist for transportation;Help with stairs or ramp for entrance    Equipment Recommendations None recommended by PT  Recommendations for Other Services       Functional Status Assessment Patient has had a recent decline in their functional status and demonstrates the ability to make significant improvements in function in a reasonable and predictable amount of time.     Precautions / Restrictions Precautions Precautions: Fall Restrictions Weight Bearing Restrictions: No      Mobility  Bed Mobility Overal bed mobility: Needs Assistance Bed Mobility: Supine to Sit, Sit  to Supine     Supine to sit: Mod assist Sit to supine: Mod assist   General bed mobility comments: assist for trunk an LEs.    Transfers Overall transfer level: Needs assistance Equipment used: 1 person hand held assist Transfers: Sit to/from Stand Sit to Stand: Min assist           General transfer comment: Min A for lift assist and steadying to stand.    Ambulation/Gait Ambulation/Gait assistance: Mod assist Gait Distance (Feet): 2 Feet Assistive device: 1 person hand held assist Gait Pattern/deviations: Shuffle Gait velocity: Decreased     General Gait Details: Unsteady steps with difficulty moving RLE. Shuffle type steps and requiring mod A for steadying.  Stairs            Wheelchair Mobility    Modified Rankin (Stroke Patients Only)       Balance Overall balance assessment: Needs assistance Sitting-balance support: No upper extremity supported Sitting balance-Leahy Scale: Fair     Standing balance support: Bilateral upper extremity supported Standing balance-Leahy Scale: Poor Standing balance comment: REliant on BUE support                             Pertinent Vitals/Pain Pain Assessment Pain Assessment: Faces Faces Pain Scale: Hurts little more Pain Location: generalized Pain Descriptors / Indicators: Grimacing, Guarding Pain Intervention(s): Limited activity within patient's tolerance, Monitored during session, Repositioned    Home Living Family/patient expects to be discharged to:: Private residence Living Arrangements: Alone Available Help at Discharge: Family;Available 24 hours/day Type of Home: House Home Access: Stairs to enter Entrance Stairs-Rails: None Entrance Stairs-Number of Steps: 1   Home Layout: One level Home Equipment: Cane - single point  Prior Function Prior Level of Function : Independent/Modified Independent             Mobility Comments: Using cane for ambulation       Hand  Dominance        Extremity/Trunk Assessment   Upper Extremity Assessment Upper Extremity Assessment: Generalized weakness    Lower Extremity Assessment Lower Extremity Assessment: Generalized weakness (increased bruising on BLE)    Cervical / Trunk Assessment Cervical / Trunk Assessment: Kyphotic  Communication   Communication: No difficulties  Cognition Arousal/Alertness: Awake/alert Behavior During Therapy: WFL for tasks assessed/performed Overall Cognitive Status: History of cognitive impairments - at baseline                                 General Comments: Dementia at baseline        General Comments      Exercises     Assessment/Plan    PT Assessment Patient needs continued PT services  PT Problem List Decreased strength;Decreased balance;Decreased activity tolerance;Decreased mobility;Decreased knowledge of use of DME;Decreased knowledge of precautions;Decreased cognition;Decreased safety awareness       PT Treatment Interventions DME instruction;Gait training;Functional mobility training;Therapeutic exercise;Therapeutic activities;Balance training;Patient/family education    PT Goals (Current goals can be found in the Care Plan section)  Acute Rehab PT Goals Patient Stated Goal: to get stronger PT Goal Formulation: With patient Time For Goal Achievement: 03/27/22 Potential to Achieve Goals: Fair    Frequency Min 2X/week     Co-evaluation               AM-PAC PT "6 Clicks" Mobility  Outcome Measure Help needed turning from your back to your side while in a flat bed without using bedrails?: A Little Help needed moving from lying on your back to sitting on the side of a flat bed without using bedrails?: A Lot Help needed moving to and from a bed to a chair (including a wheelchair)?: A Lot Help needed standing up from a chair using your arms (e.g., wheelchair or bedside chair)?: A Lot Help needed to walk in hospital room?:  Total Help needed climbing 3-5 steps with a railing? : Total 6 Click Score: 11    End of Session Equipment Utilized During Treatment: Gait belt Activity Tolerance: Patient tolerated treatment well Patient left: in bed;with call bell/phone within reach (on stretcher in ED) Nurse Communication: Mobility status PT Visit Diagnosis: Unsteadiness on feet (R26.81);Muscle weakness (generalized) (M62.81)    Time: 3235-5732 PT Time Calculation (min) (ACUTE ONLY): 14 min   Charges:   PT Evaluation $PT Eval Moderate Complexity: 1 Mod          Reuel Derby, PT, DPT  Acute Rehabilitation Services  Office: [phone]   Rudean Hitt 03/13/2022, 4:27 PM

## 2022-03-13 NOTE — NC FL2 (Signed)
[place] MEDICAID FL2 LEVEL OF CARE SCREENING TOOL     IDENTIFICATION  Patient Name: Megan Olsen Birthdate: 1927-10-08 Sex: female Admission Date (Current Location): 03/13/2022  [facility] and [place] Number:  Herbalist and Address:  [facility], [place] [address], [place], [place] [address]      Provider Number: [id number]  Attending Physician Name and Address:  Davonna Belling, MD  Relative Name and Phone Number:  Kaylyn Layer (Niece)   [phone]    Current Level of Care: Hospital Recommended Level of Care: [place] Prior Approval Number:    Date Approved/Denied:   PASRR Number: [id number]  Discharge Plan: SNF    Current Diagnoses: Patient Active Problem List   Diagnosis Date Noted   Encounter for testing for latent tuberculosis 02/16/2022   History of myocardial infarction 01/26/2022   NSTEMI (non-ST elevated myocardial infarction) ([place]) 01/30/2019   B12 deficiency 08/22/2018   Facet arthritis, degenerative, cervical spine 08/22/2018   Tachy-brady syndrome ([place]) 04/27/2018   Cervical spondylosis without myelopathy 03/11/2018   Myofascial pain syndrome 03/11/2018   Cervicalgia 03/11/2018   Insomnia 01/26/2018   High risk medication use 01/26/2018   Macular degeneration of both eyes 01/26/2018   S/P right THA, AA 02/23/2017   A-fib ([place]), s/p catheter ablation of SVT in 2002 07/06/2014   History of fracture of left hip 07/06/2014   Benign essential HTN 07/06/2014   GERD (gastroesophageal reflux disease) 07/06/2014   Anxiety disorder 07/06/2014   History of CVA (cerebrovascular accident), without residual effects 07/06/2014   Iron deficiency anemia 01/02/2013   Constipation, intermittent 08/04/2007   Depression 01/11/2007   Allergic rhinitis 01/11/2007   Osteoarthritis 01/11/2007   Osteoporosis 01/11/2007    Orientation RESPIRATION BLADDER Height & Weight     Self, Time, Situation, Place  Normal  Continent Weight: 110 lb (49.9 kg) Height:  '5\' 3"'$  (160 cm)  BEHAVIORAL SYMPTOMS/MOOD NEUROLOGICAL BOWEL NUTRITION STATUS      Continent Diet (Regular)  AMBULATORY STATUS COMMUNICATION OF NEEDS Skin   Extensive Assist Verbally Normal                       Personal Care Assistance Level of Assistance  Bathing, Feeding, Dressing Bathing Assistance: Limited assistance Feeding assistance: Independent Dressing Assistance: Limited assistance     Functional Limitations Info  Sight, Hearing, Speech Sight Info: Impaired (Low acuity) Hearing Info: Adequate Speech Info: Adequate    SPECIAL CARE FACTORS FREQUENCY                       Contractures Contractures Info: Not present    Additional Factors Info  Code Status, Allergies Code Status Info: No current code Allergies Info: Propoxyphene N-acetaminophen, Celecoxib, Fexofenadine, Penicillins           Current Medications (03/13/2022):  This is the current hospital active medication list No current facility-administered medications for this encounter.   Current Outpatient Medications  Medication Sig Dispense Refill   acetaminophen (TYLENOL) 650 MG CR tablet Take 650 mg by mouth every 8 (eight) hours as needed for pain.      apixaban (ELIQUIS) 2.5 MG TABS tablet TAKE 1 TABLET TWICE DAILY 180 tablet 2   bisoprolol-hydrochlorothiazide (ZIAC) 5-6.25 MG tablet Take 1 tablet by mouth 2 (two) times daily.     dexlansoprazole (DEXILANT) 60 MG capsule TAKE 1 CAPSULE EVERY DAY 90 capsule 3   fluticasone (FLONASE) 50 MCG/ACT nasal spray PLACE 2  SPRAYS INTO BOTH NOSTRILS DAILY. (Patient taking differently: Place 2 sprays into both nostrils as needed.) 48 g 1   LORazepam (ATIVAN) 1 MG tablet TAKE 1 TABLET BY MOUTH AT BEDTIME AS NEEDED FOR ANXIETY OR SLEEP 90 tablet 3   losartan (COZAAR) 25 MG tablet Take 1 tablet (25 mg total) by mouth daily. 90 tablet 3   Multiple Vitamins-Minerals (PRESERVISION AREDS 2) CAPS Take 1 capsule by mouth  2 (two) times daily. 90 capsule 3   nitroGLYCERIN (NITROSTAT) 0.4 MG SL tablet Place 1 tablet (0.4 mg total) under the tongue every 5 (five) minutes x 3 doses as needed for chest pain. 30 tablet 0   NON FORMULARY Place 1 Dose into both eyes See admin instructions. Receives injections into both eyes every 5-8 weeks at Dr. Serita Grit office [facility])     traZODone (DESYREL) 100 MG tablet Take 1 tablet (100 mg total) by mouth at bedtime. 90 tablet 3     Discharge Medications: Please see discharge summary for a list of discharge medications.  Relevant Imaging Results:  Relevant Lab Results:   Additional Information SS # [id number]  Raina Mina, LCSWA

## 2022-03-13 NOTE — ED Notes (Signed)
One CT scanner down, others being used for higher acuity.  Caryl Pina, [place] noted we needed to get CT scan done.  This RN awaiting call.

## 2022-03-13 NOTE — ED Notes (Signed)
[place], RN [phone] has been assigned for inpatient TOC. Waiting for handoff report

## 2022-03-13 NOTE — ED Notes (Signed)
[employer] pacemaker interrogated.  Awaiting results.  No new orders at this time.

## 2022-03-13 NOTE — ED Notes (Addendum)
Pt up out of bed again bed alarm sounded, pt shut door to her room, pt pulled off her diaper and pulled out the purwick. Dr. Mayra Neer at the bedside. Pt assisted back to bed, diaper reapplied, pericare performed and puwick reapplied. Warm blankets placed back on pt for comfort, side rails up x2, call bell in reach, door open, pt reeducated regarding fall risk and staying in bed for safety, pt verbalized understanding. Reminded pt use of call bell, call bell within reach for pt. Bed alarm is on and in appropriate position  Request for medication or non-violent soft restraints to assist with safety of pt from falling, safety sitter order placed by this RN

## 2022-03-13 NOTE — ED Provider Notes (Signed)
[facility] EMERGENCY DEPARTMENT Provider Note   CSN: [id number] Arrival date & time: 03/13/22  1219     History  Chief Complaint  Patient presents with   Fall    On Thinners    Megan Olsen is a 88 y.o. female.   Fall  Patient presents after a fall.  Reportedly fell this morning attempted get up and go to bathroom.  Hit back of head.  Brought in as a level 2 trauma since she is on anticoagulation.  States she hurts in her head.  No numbness or weakness.  No confusion.  Presents with cervical collar.  States she has been having spells where she is felt bad.  Reviewing records appears to have a history of tachybradycardia syndrome and has a pacemaker.    Past Medical History:  Diagnosis Date   Age-related macular degeneration, wet, both eyes ([place])    Anxiety disorder 07/06/2014   Arthritis    "all over" (04/27/2018)   Atrial fibrillation ([place])    catheter ablation of SVT in 2002   Benign essential HTN 07/06/2014   Constipation, intermittent 08/04/2007   CVA (cerebrovascular accident) ([place]) 2003   left brain; denies residual on 04/27/2018   Depression    GERD (gastroesophageal reflux disease)    Heart murmur    hx (04/27/2018)   Hypothyroidism 09/07/2014   "off RX now" (04/27/2018)   Iron deficiency anemia    Iron deficiency anemia, unspecified 01/02/2013   Osteoarthritis    Osteoporosis 01/11/2007   Pneumonia    "one time; years and years ago" (04/27/2018)   Presence of permanent cardiac pacemaker 04/27/2018    Dual Chamber   Right BBB/left ant fasc block     Home Medications Prior to Admission medications   Medication Sig Start Date End Date Taking? Authorizing Provider  acetaminophen (TYLENOL) 650 MG CR tablet Take 650 mg by mouth every 8 (eight) hours as needed for pain.     [provider]  apixaban (ELIQUIS) 2.5 MG TABS tablet TAKE 1 TABLET TWICE DAILY 01/19/22   Deboraha Sprang, MD  bisoprolol-hydrochlorothiazide [facility]) 5-6.25 MG  tablet Take 1 tablet by mouth 2 (two) times daily.    [provider]  dexlansoprazole (DEXILANT) 60 MG capsule TAKE 1 CAPSULE EVERY DAY 11/12/21   Koberlein, Junell C, MD  fluticasone (FLONASE) 50 MCG/ACT nasal spray PLACE 2 SPRAYS INTO BOTH NOSTRILS DAILY. Patient taking differently: Place 2 sprays into both nostrils as needed. 09/05/21   Koberlein, Steele Berg, MD  LORazepam (ATIVAN) 1 MG tablet TAKE 1 TABLET BY MOUTH AT BEDTIME AS NEEDED FOR ANXIETY OR SLEEP 01/19/22   Dutch Quint B, FNP  losartan (COZAAR) 25 MG tablet Take 1 tablet (25 mg total) by mouth daily. 06/27/21   Caren Macadam, MD  Multiple Vitamins-Minerals (PRESERVISION AREDS 2) CAPS Take 1 capsule by mouth 2 (two) times daily. 04/15/17   Marletta Lor, MD  nitroGLYCERIN (NITROSTAT) 0.4 MG SL tablet Place 1 tablet (0.4 mg total) under the tongue every 5 (five) minutes x 3 doses as needed for chest pain. 02/01/19   Nita Sells, MD  NON FORMULARY Place 1 Dose into both eyes See admin instructions. Receives injections into both eyes every 5-8 weeks at Dr. Serita Grit office [facility])    [provider]  traZODone (DESYREL) 100 MG tablet Take 1 tablet (100 mg total) by mouth at bedtime. 06/27/21   Caren Macadam, MD      Allergies  Propoxyphene n-acetaminophen, Celecoxib, Fexofenadine, and Penicillins    Review of Systems   Review of Systems  Physical Exam Updated Vital Signs BP (!) 190/74   Pulse (!) 50   Temp 97.8 F (36.6 C) (Oral)   Resp 17   Ht '5\' 3"'$  (1.6 m)   Wt 49.9 kg   SpO2 100%   BMI 19.49 kg/m  Physical Exam Vitals reviewed.  HENT:     Head:     Comments: Hematoma right occipital area. Eyes:     Pupils: Pupils are equal, round, and reactive to light.  Neck:     Comments: Mild midline tenderness without deformity.  Cervical collar in place. Cardiovascular:     Rate and Rhythm: Normal rate.  Pulmonary:     Comments: Pacemaker to left chest  wall. Musculoskeletal:        General: No tenderness.     Comments: Some ecchymosis to right forearm without underlying bony tenderness.  Skin:    Capillary Refill: Capillary refill takes less than 2 seconds.  Neurological:     Mental Status: She is alert and oriented to person, place, and time.     ED Results / Procedures / Treatments   Labs (all labs ordered are listed, but only abnormal results are displayed) Labs Reviewed  CK - Abnormal; Notable for the following components:      Result Value   Total CK 759 (*)    All other components within normal limits  COMPREHENSIVE METABOLIC PANEL - Abnormal; Notable for the following components:   Glucose, Bld 110 (*)    Calcium 8.7 (*)    Total Protein 5.7 (*)    Albumin 3.3 (*)    AST 43 (*)    All other components within normal limits  CBC WITH DIFFERENTIAL/PLATELET - Abnormal; Notable for the following components:   RBC 3.67 (*)    Hemoglobin 11.5 (*)    HCT 34.3 (*)    Platelets 110 (*)    Lymphs Abs 0.3 (*)    All other components within normal limits  URINALYSIS, ROUTINE W REFLEX MICROSCOPIC    EKG EKG Interpretation  Date/Time:  Friday March 13 2022 12:37:25 EDT Ventricular Rate:  68 PR Interval:  72 QRS Duration: 126 QT Interval:  455 QTC Calculation: 573 R Axis:   133 Text Interpretation: Ventricular-paced complexes No further analysis attempted due to paced rhythm Confirmed by Davonna Belling [phone]) on 03/13/2022 3:46:34 PM  Radiology CT HEAD WO CONTRAST (5MM)  Result Date: 03/13/2022 CLINICAL DATA:  Head trauma, minor (Age >= 65y); Neck trauma (Age >= 65y) EXAM: CT HEAD WITHOUT CONTRAST CT CERVICAL SPINE WITHOUT CONTRAST TECHNIQUE: Multidetector CT imaging of the head and cervical spine was performed following the standard protocol without intravenous contrast. Multiplanar CT image reconstructions of the cervical spine were also generated. RADIATION DOSE REDUCTION: This exam was performed according to the  departmental dose-optimization program which includes automated exposure control, adjustment of the mA and/or kV according to patient size and/or use of iterative reconstruction technique. COMPARISON:  CTA head/neck 01/30/2019. FINDINGS: CT HEAD FINDINGS Brain: No evidence of acute infarction, hemorrhage, hydrocephalus, extra-axial collection or mass lesion/mass effect. Patchy white matter hypodensities, nonspecific but compatible with chronic microvascular ischemic disease. Vascular: No hyperdense vessel identified. Calcific intracranial atherosclerosis. Skull: No acute fracture. Sinuses/Orbits: Periapical lucency of a left maxillary molar with inferior left maxillary sinus mucosal thickening, possibly odontogenic. No acute orbital findings. Other: No mastoid effusions. CT CERVICAL SPINE FINDINGS Alignment: Similar anterolisthesis of C4  on C5. No new sagittal subluxation. Levocurvature. Skull base and vertebrae: Vertebral body heights are maintained. No evidence of acute fracture. Soft tissues and spinal canal: No prevertebral fluid or swelling. No visible canal hematoma. Disc levels: Severe multilevel degenerative change including severe degenerative disease at is greatest at C6-C7, multilevel ankylosis and facet arthropathy and craniocervical degenerative change. Upper chest: Biapical pleuroparenchymal scarring. Otherwise, visualized lung apices are clear. IMPRESSION: 1. No evidence of acute intracranial abnormality. 2. No evidence of acute fracture or traumatic malalignment in the cervical spine. Electronically Signed   By: Margaretha Sheffield M.D.   On: 03/13/2022 13:44   CT Cervical Spine Wo Contrast  Result Date: 03/13/2022 CLINICAL DATA:  Head trauma, minor (Age >= 65y); Neck trauma (Age >= 65y) EXAM: CT HEAD WITHOUT CONTRAST CT CERVICAL SPINE WITHOUT CONTRAST TECHNIQUE: Multidetector CT imaging of the head and cervical spine was performed following the standard protocol without intravenous contrast.  Multiplanar CT image reconstructions of the cervical spine were also generated. RADIATION DOSE REDUCTION: This exam was performed according to the departmental dose-optimization program which includes automated exposure control, adjustment of the mA and/or kV according to patient size and/or use of iterative reconstruction technique. COMPARISON:  CTA head/neck 01/30/2019. FINDINGS: CT HEAD FINDINGS Brain: No evidence of acute infarction, hemorrhage, hydrocephalus, extra-axial collection or mass lesion/mass effect. Patchy white matter hypodensities, nonspecific but compatible with chronic microvascular ischemic disease. Vascular: No hyperdense vessel identified. Calcific intracranial atherosclerosis. Skull: No acute fracture. Sinuses/Orbits: Periapical lucency of a left maxillary molar with inferior left maxillary sinus mucosal thickening, possibly odontogenic. No acute orbital findings. Other: No mastoid effusions. CT CERVICAL SPINE FINDINGS Alignment: Similar anterolisthesis of C4 on C5. No new sagittal subluxation. Levocurvature. Skull base and vertebrae: Vertebral body heights are maintained. No evidence of acute fracture. Soft tissues and spinal canal: No prevertebral fluid or swelling. No visible canal hematoma. Disc levels: Severe multilevel degenerative change including severe degenerative disease at is greatest at C6-C7, multilevel ankylosis and facet arthropathy and craniocervical degenerative change. Upper chest: Biapical pleuroparenchymal scarring. Otherwise, visualized lung apices are clear. IMPRESSION: 1. No evidence of acute intracranial abnormality. 2. No evidence of acute fracture or traumatic malalignment in the cervical spine. Electronically Signed   By: Margaretha Sheffield M.D.   On: 03/13/2022 13:44   DG Pelvis Portable  Result Date: 03/13/2022 CLINICAL DATA:  Trauma, fall EXAM: PORTABLE PELVIS 1-2 VIEWS COMPARISON:  None Available. FINDINGS: There is previous right hip arthroplasty. There is  previous internal fixation in the neck of left femur with surgical screws. No recent displaced fracture is seen. Extensive arterial calcifications are seen. Degenerative changes are noted in lower lumbar spine. IMPRESSION: No displaced fracture is seen in pelvis. Other findings as described in the body of the report. Electronically Signed   By: Elmer Picker M.D.   On: 03/13/2022 12:41   DG Chest Portable 1 View  Result Date: 03/13/2022 CLINICAL DATA:  Trauma, fall EXAM: PORTABLE CHEST 1 VIEW COMPARISON:  02/08/2019 FINDINGS: Transverse diameter of heart is increased. Pacemaker battery is seen in the left infraclavicular region. There are no signs of pulmonary edema or focal pulmonary consolidation. There is no pleural effusion or pneumothorax. IMPRESSION: Cardiomegaly. There are no signs of pulmonary edema or focal pulmonary consolidation. There is no pleural effusion or pneumothorax. Electronically Signed   By: Elmer Picker M.D.   On: 03/13/2022 12:40    Procedures Procedures    Medications Ordered in ED Medications - No data to display  ED Course/  Medical Decision Making/ A&P                           Medical Decision Making Amount and/or Complexity of Data Reviewed Labs: ordered. Radiology: ordered.   Patient with fall.  Reportedly felt a little dizzy.  Has been laying on the floor potentially since 7 AM.  On anticoagulation with occipital hematoma.  No bleeding.  We will get imaging of head and neck to rule out cervical spine injury and intracranial hemorrhage.  We will also get some basic blood work and x-rays of chest and the pelvis.  Will interrogate pacemaker and get CK since she has been on the floor potentially for 5 hours.  CK is mildly elevated but not at a level that likely would be rhabdomyolysis.  Lab work is otherwise near baseline.  Discussed with patient's niece who helps manage her.  They have been attempting get her placed at a higher level of care.  Do not  think she be able to manage at home particularly after this fall.   Has been seen by transitions of care.  They requested PT consult.  We will get PT consult and hopefully can get done before end of business today on Friday.  If done and recommends higher level of care hopefully can get placed if not likely would require discharge home with home health to be arranged.  Care will be turned over to oncoming provider.         Final Clinical Impression(s) / ED Diagnoses Final diagnoses:  Fall, initial encounter  Generalized weakness  Minor head injury, initial encounter    Rx / DC Orders ED Discharge Orders     None         Davonna Belling, MD 03/13/22 1547

## 2022-03-13 NOTE — ED Triage Notes (Signed)
Pt BIB [facility] from home due to fall at 0700 trying to go to the bathroom.  Pt found on ground and wooden furniture behind head. Hematoma on back of head.  Bruise on right forearm.  Hx of dementia and stroke.  Pt does have st jude pacemaker on left side of chest.  VS BP 150/90, HR 60, 99% RA.  20g right forearm.

## 2022-03-14 ENCOUNTER — Encounter (HOSPITAL_COMMUNITY): Payer: Self-pay | Admitting: Internal Medicine

## 2022-03-14 DIAGNOSIS — R748 Abnormal levels of other serum enzymes: Secondary | ICD-10-CM | POA: Diagnosis not present

## 2022-03-14 DIAGNOSIS — R531 Weakness: Secondary | ICD-10-CM | POA: Diagnosis not present

## 2022-03-14 DIAGNOSIS — D509 Iron deficiency anemia, unspecified: Secondary | ICD-10-CM | POA: Diagnosis not present

## 2022-03-14 DIAGNOSIS — W19XXXA Unspecified fall, initial encounter: Secondary | ICD-10-CM

## 2022-03-14 DIAGNOSIS — I480 Paroxysmal atrial fibrillation: Secondary | ICD-10-CM | POA: Diagnosis not present

## 2022-03-14 LAB — COMPREHENSIVE METABOLIC PANEL
ALT: 26 U/L (ref 0–44)
AST: 36 U/L (ref 15–41)
Albumin: 3.3 g/dL — ABNORMAL LOW (ref 3.5–5.0)
Alkaline Phosphatase: 114 U/L (ref 38–126)
Anion gap: 8 (ref 5–15)
BUN: 11 mg/dL (ref 8–23)
CO2: 23 mmol/L (ref 22–32)
Calcium: 8.7 mg/dL — ABNORMAL LOW (ref 8.9–10.3)
Chloride: 106 mmol/L (ref 98–111)
Creatinine, Ser: 0.77 mg/dL (ref 0.44–1.00)
GFR, Estimated: >60 mL/min (ref >60)
Glucose, Bld: 82 mg/dL (ref 70–99)
Potassium: 3.4 mmol/L — ABNORMAL LOW (ref 3.5–5.1)
Sodium: 137 mmol/L (ref 135–145)
Total Bilirubin: 1.1 mg/dL (ref 0.3–1.2)
Total Protein: 5.6 g/dL — ABNORMAL LOW (ref 6.5–8.1)

## 2022-03-14 LAB — CBC WITH DIFFERENTIAL/PLATELET
Abs Immature Granulocytes: 0.02 10*3/uL (ref 0.00–0.07)
Basophils Absolute: 0 10*3/uL (ref 0.0–0.1)
Basophils Relative: 0 %
Eosinophils Absolute: 0.1 10*3/uL (ref 0.0–0.5)
Eosinophils Relative: 2 %
HCT: 34.7 % — ABNORMAL LOW (ref 36.0–46.0)
Hemoglobin: 12.1 g/dL (ref 12.0–15.0)
Immature Granulocytes: 0 %
Lymphocytes Relative: 7 %
Lymphs Abs: 0.4 10*3/uL — ABNORMAL LOW (ref 0.7–4.0)
MCH: 31.6 pg (ref 26.0–34.0)
MCHC: 34.9 g/dL (ref 30.0–36.0)
MCV: 90.6 fL (ref 80.0–100.0)
Monocytes Absolute: 0.4 10*3/uL (ref 0.1–1.0)
Monocytes Relative: 8 %
Neutro Abs: 4.4 10*3/uL (ref 1.7–7.7)
Neutrophils Relative %: 83 %
Platelets: 129 10*3/uL — ABNORMAL LOW (ref 150–400)
RBC: 3.83 MIL/uL — ABNORMAL LOW (ref 3.87–5.11)
RDW: 13.2 % (ref 11.5–15.5)
WBC: 5.4 10*3/uL (ref 4.0–10.5)
nRBC: 0 % (ref 0.0–0.2)

## 2022-03-14 LAB — TSH: TSH: 4.439 u[IU]/mL (ref 0.350–4.500)

## 2022-03-14 LAB — CK: Total CK: 518 U/L — ABNORMAL HIGH (ref 38–234)

## 2022-03-14 LAB — MAGNESIUM: Magnesium: 1.6 mg/dL — ABNORMAL LOW (ref 1.7–2.4)

## 2022-03-14 MED ORDER — LORAZEPAM 0.5 MG PO TABS
0.5000 mg | ORAL_TABLET | Freq: Every evening | ORAL | Status: DC | PRN
  Administered 2022-03-14 – 2022-03-17 (×2): 0.5 mg via ORAL
  Filled 2022-03-14 (×2): qty 1

## 2022-03-14 MED ORDER — LOSARTAN POTASSIUM 50 MG PO TABS
25.0000 mg | ORAL_TABLET | Freq: Every day | ORAL | Status: DC
  Administered 2022-03-14 – 2022-03-17 (×4): 25 mg via ORAL
  Filled 2022-03-14 (×4): qty 1

## 2022-03-14 MED ORDER — BISOPROLOL FUMARATE 5 MG PO TABS
5.0000 mg | ORAL_TABLET | Freq: Two times a day (BID) | ORAL | Status: DC
Start: 2022-03-14 — End: 2022-03-17
  Administered 2022-03-14 – 2022-03-17 (×5): 5 mg via ORAL
  Filled 2022-03-14 (×8): qty 1

## 2022-03-14 MED ORDER — APIXABAN 2.5 MG PO TABS
2.5000 mg | ORAL_TABLET | Freq: Two times a day (BID) | ORAL | Status: DC
  Administered 2022-03-14 – 2022-03-17 (×7): 2.5 mg via ORAL
  Filled 2022-03-14 (×7): qty 1

## 2022-03-14 MED ORDER — SODIUM CHLORIDE 0.9 % IV SOLN: INTRAVENOUS | Status: DC

## 2022-03-14 MED ORDER — POTASSIUM CHLORIDE 20 MEQ PO PACK
40.0000 meq | PACK | Freq: Once | ORAL | Status: AC
  Administered 2022-03-14: 40 meq via ORAL
  Filled 2022-03-14: qty 2

## 2022-03-14 MED ORDER — MAGNESIUM SULFATE 2 GM/50ML IV SOLN
2.0000 g | Freq: Once | INTRAVENOUS | Status: AC
  Administered 2022-03-14: 2 g via INTRAVENOUS
  Filled 2022-03-14: qty 50

## 2022-03-14 NOTE — Evaluation (Signed)
Occupational Therapy Evaluation Patient Details Name: Megan Olsen MRN: [id number] DOB: 01-05-1928 Today's Date: 03/14/2022   History of Present Illness Pt is a 88 y/o female presenting  to the ED secondary to fall. CT negative for acute findings. PMH includes CVA, dementia, and pacemaker.   Clinical Impression   Pt independent at baseline with ADLs and functional mobility, lives alone but has family that provides PRN assist with IADLs. Pt currently needing set up -mod A for ADLs, minA for bed mobility, and min A for step pivot transfer from chair > bed. Pt fatigued, closing eyes intermittently during session. Pt with dementia at baseline, does not recall falling/coming to hospital. Pt presenting with impairments listed below, will follow acutely. Recommend SNF at d/c.      Recommendations for follow up therapy are one component of a multi-disciplinary discharge planning process, led by the attending physician.  Recommendations may be updated based on patient status, additional functional criteria and insurance authorization.   Follow Up Recommendations  Skilled nursing-short term rehab (<3 hours/day)    Assistance Recommended at Discharge Frequent or constant Supervision/Assistance  Patient can return home with the following A little help with walking and/or transfers;A lot of help with bathing/dressing/bathroom;Assistance with cooking/housework;Direct supervision/assist for medications management;Direct supervision/assist for financial management;Assist for transportation;Help with stairs or ramp for entrance    Functional Status Assessment  Patient has had a recent decline in their functional status and demonstrates the ability to make significant improvements in function in a reasonable and predictable amount of time.  Equipment Recommendations  Other (comment);BSC/3in1 (as shower seat)    Recommendations for Other Services PT consult     Precautions / Restrictions  Precautions Precautions: Fall Restrictions Weight Bearing Restrictions: No      Mobility Bed Mobility Overal bed mobility: Needs Assistance Bed Mobility: Sit to Supine       Sit to supine: Min assist        Transfers Overall transfer level: Needs assistance Equipment used: 1 person hand held assist Transfers: Sit to/from Stand, Bed to chair/wheelchair/BSC Sit to Stand: Min assist     Step pivot transfers: Min assist            Balance Overall balance assessment: Needs assistance Sitting-balance support: No upper extremity supported Sitting balance-Leahy Scale: Fair     Standing balance support: Bilateral upper extremity supported Standing balance-Leahy Scale: Poor                             ADL either performed or assessed with clinical judgement   ADL Overall ADL's : Needs assistance/impaired Eating/Feeding: Supervision/ safety;Sitting   Grooming: Set up;Sitting   Upper Body Bathing: Minimal assistance;Sitting   Lower Body Bathing: Moderate assistance;Sitting/lateral leans   Upper Body Dressing : Minimal assistance;Sitting   Lower Body Dressing: Moderate assistance;Sitting/lateral leans   Toilet Transfer: Minimal assistance;Stand-pivot;Squat-pivot;BSC/3in1   Toileting- Clothing Manipulation and Hygiene: Minimal assistance       Functional mobility during ADLs: Minimal assistance       Vision Baseline Vision/History: 6 Macular Degeneration Vision Assessment?: Vision impaired- to be further tested in functional context     Perception     Praxis      Pertinent Vitals/Pain Pain Assessment Pain Assessment: No/denies pain     Hand Dominance     Extremity/Trunk Assessment Upper Extremity Assessment Upper Extremity Assessment: Generalized weakness   Lower Extremity Assessment Lower Extremity Assessment: Defer to PT evaluation   Cervical /  Trunk Assessment Cervical / Trunk Assessment: Kyphotic   Communication  Communication Communication: No difficulties   Cognition Arousal/Alertness: Awake/alert Behavior During Therapy: WFL for tasks assessed/performed Overall Cognitive Status: History of cognitive impairments - at baseline                                 General Comments: Dementia at baseline, unable to recall falling, or why she is at the hospital     General Comments  VSS on RA, family in room    Exercises     Shoulder Instructions      Home Living Family/patient expects to be discharged to:: Private residence Living Arrangements: Alone Available Help at Discharge: Family;Available 24 hours/day Type of Home: Apartment Home Access: Stairs to enter Entrance Stairs-Number of Steps: 1 Entrance Stairs-Rails: None Home Layout: One level     Bathroom Shower/Tub: Walk-in shower;Curtain (tub has been cut out to make it into a shower)   Biochemist, clinical: Standard     Home Equipment: Cane - single point          Prior Functioning/Environment Prior Level of Function : Independent/Modified Independent             Mobility Comments: reports no AD use, SPC used occasionally for community mobility/outside of home ADLs Comments: neice assists with driving, pt does own med mgmt        OT Problem List: Decreased strength;Decreased range of motion;Decreased activity tolerance;Impaired balance (sitting and/or standing);Decreased safety awareness;Impaired vision/perception      OT Treatment/Interventions: Self-care/ADL training;Therapeutic exercise;DME and/or AE instruction;Energy conservation;Therapeutic activities;Cognitive remediation/compensation;Visual/perceptual remediation/compensation;Patient/family education;Balance training    OT Goals(Current goals can be found in the care plan section) Acute Rehab OT Goals Patient Stated Goal: none stated OT Goal Formulation: With patient Time For Goal Achievement: 03/28/22 Potential to Achieve Goals: Good ADL Goals Pt  Will Perform Upper Body Dressing: with supervision;sitting Pt Will Perform Lower Body Dressing: with min assist;sitting/lateral leans;sit to/from stand Pt Will Transfer to Toilet: with supervision;ambulating;regular height toilet Additional ADL Goal #1: pt will perform bed mobility mod I in prep for ADL  OT Frequency: Min 2X/week    Co-evaluation              AM-PAC OT "6 Clicks" Daily Activity     Outcome Measure Help from another person eating meals?: A Little Help from another person taking care of personal grooming?: A Little Help from another person toileting, which includes using toliet, bedpan, or urinal?: A Little Help from another person bathing (including washing, rinsing, drying)?: A Lot Help from another person to put on and taking off regular upper body clothing?: A Little Help from another person to put on and taking off regular lower body clothing?: A Lot 6 Click Score: 16   End of Session Equipment Utilized During Treatment: Gait belt Nurse Communication: Mobility status  Activity Tolerance: Patient tolerated treatment well Patient left: in bed;with call bell/phone within reach;with bed alarm set;with family/visitor present  OT Visit Diagnosis: Unsteadiness on feet (R26.81);Other abnormalities of gait and mobility (R26.89);Muscle weakness (generalized) (M62.81)                Time: 1300-1316 OT Time Calculation (min): 16 min Charges:  OT General Charges $OT Visit: 1 Visit OT Evaluation $OT Eval Low Complexity: 1 Low  Lynnda Child, OTD, OTR/L Acute Rehab [phone] - [place] 03/14/2022, 2:05 PM

## 2022-03-14 NOTE — Progress Notes (Signed)
PROGRESS NOTE        PATIENT DETAILS Name: Megan Olsen Age: 88 y.o. Sex: female Date of Birth: 1928/02/19 Admit Date: 03/13/2022 Admitting Physician Rhetta Mura, DO OMV:[id number], Royston Cowper, MD  Brief Summary: Patient is a 88 y.o.  female with history of PAF on Eliquis, sick sinus syndrome-s/p PPM placement, dementia, HTN, chronic anemia-who presented to the ED following a mechanical fall.  She was subsequently admitted for further evaluation and treatment.  Significant events: 8/25>> mechanical fall-admit to [facility].  Significant studies: 8/25>> CXR: No PNA 8/25>> x-ray pelvis: No fracture 8/25>> CT head: No acute intracranial abnormality. 8/25>> CT C-spine: No fracture.  Significant microbiology data: None  Procedures: None  Consults: None  Subjective: Lying comfortably in bed-denies any chest pain or shortness of breath.  Objective: Vitals: Blood pressure 132/63, pulse 68, temperature 98.1 F (36.7 C), temperature source Oral, resp. rate 18, height '5\' 3"'$  (1.6 m), weight 49.9 kg, SpO2 94 %.   Exam: Gen Exam:Alert awake-not in any distress HEENT:atraumatic, normocephalic Chest: B/L clear to auscultation anteriorly CVS:S1S2 regular Abdomen:soft non tender, non distended Extremities:no edema Neurology: Non focal Skin: no rash  Pertinent Labs/Radiology:    Latest Ref Rng & Units 03/14/2022    6:13 AM 03/13/2022   11:30 AM 11/07/2021    3:20 PM  CBC  WBC 4.0 - 10.5 K/uL 5.4  7.2  5.2   Hemoglobin 12.0 - 15.0 g/dL 12.1  11.5  12.2   Hematocrit 36.0 - 46.0 % 34.7  34.3  36.1   Platelets 150 - 400 K/uL 129  110  160     Lab Results  Component Value Date   NA 137 03/14/2022   K 3.4 (L) 03/14/2022   CL 106 03/14/2022   CO2 23 03/14/2022     Assessment/Plan: Mechanical fall: Denies syncope-imaging negative for fractures-await PT/OT eval.\\  Rhabdomyolysis: Mild-stop IVF-follow periodically.  Hypokalemia: Replete and recheck.   Likely due to HCTZ.  Hypomagnesemia: Replete and recheck.  Likely due to HCTZ.  Thrombocytopenia: Mild-stable for outpatient follow-up.  Chronic iron deficiency anemia: Stable for ongoing monitoring in the outpatient setting.  Appears to be mild.  PAF: Paced rhythm-continue bisoprolol-on Eliquis.  HTN: BP stable-continue losartan/beta-blocker-continue to hold HCTZ.  Generalized anxiety disorder: Appears stable-continue as needed Ativan.  Dementia: Relatively awake/alert-maintain delirium precautions  Underweight: Estimated body mass index is 19.49 kg/m as calculated from the following:   Height as of this encounter: '5\' 3"'$  (1.6 m).   Weight as of this encounter: 49.9 kg.   Code status:   Code Status: DNR   DVT Prophylaxis: apixaban (ELIQUIS) tablet 2.5 mg Start: 03/14/22 1000 SCDs Start: 03/13/22 2109 apixaban (ELIQUIS) tablet 2.5 mg     Family Communication: Niece-Andrea [phone]-updated over the phone on 8/26   Disposition Plan: Status is: Inpatient Remains inpatient appropriate because: Mechanical fall-needs SNF-lives alone-unsafe discharge.  Awaiting SNF bed.   Planned Discharge Destination:Skilled nursing facility   Diet: Diet Order             Diet regular Room service appropriate? Yes; Fluid consistency: Thin  Diet effective now                     Antimicrobial agents: Anti-infectives (From admission, onward)    None        MEDICATIONS: Scheduled Meds:  apixaban  2.5  mg Oral BID   bisoprolol  5 mg Oral BID   losartan  25 mg Oral Daily   Continuous Infusions:  sodium chloride 75 mL/hr at 03/14/22 0623   PRN Meds:.acetaminophen **OR** acetaminophen, hydrALAZINE, LORazepam   I have personally reviewed following labs and imaging studies  LABORATORY DATA: CBC: Recent Labs  Lab 03/13/22 1130 03/14/22 0613  WBC 7.2 5.4  NEUTROABS 6.1 4.4  HGB 11.5* 12.1  HCT 34.3* 34.7*  MCV 93.5 90.6  PLT 110* 129*    Basic  Metabolic Panel: Recent Labs  Lab 03/13/22 1130 03/14/22 0613  NA 135 137  K 4.0 3.4*  CL 101 106  CO2 25 23  GLUCOSE 110* 82  BUN 13 11  CREATININE 0.77 0.77  CALCIUM 8.7* 8.7*  MG 1.6* 1.6*    GFR: Estimated Creatinine Clearance: 35.3 mL/min (by C-G formula based on SCr of 0.77 mg/dL).  Liver Function Tests: Recent Labs  Lab 03/13/22 1130 03/14/22 0613  AST 43* 36  ALT 30 26  ALKPHOS 105 114  BILITOT 0.6 1.1  PROT 5.7* 5.6*  ALBUMIN 3.3* 3.3*   No results for input(s): "LIPASE", "AMYLASE" in the last 168 hours. No results for input(s): "AMMONIA" in the last 168 hours.  Coagulation Profile: No results for input(s): "INR", "PROTIME" in the last 168 hours.  Cardiac Enzymes: Recent Labs  Lab 03/13/22 1130 03/14/22 0613  CKTOTAL 759* 518*    BNP (last 3 results) No results for input(s): "PROBNP" in the last 8760 hours.  Lipid Profile: No results for input(s): "CHOL", "HDL", "LDLCALC", "TRIG", "CHOLHDL", "LDLDIRECT" in the last 72 hours.  Thyroid Function Tests: Recent Labs    03/14/22 0613  TSH 4.439    Anemia Panel: No results for input(s): "VITAMINB12", "FOLATE", "FERRITIN", "TIBC", "IRON", "RETICCTPCT" in the last 72 hours.  Urine analysis:    Component Value Date/Time   COLORURINE YELLOW 03/13/2022 1800   APPEARANCEUR CLEAR 03/13/2022 1800   LABSPEC 1.009 03/13/2022 1800   PHURINE 6.0 03/13/2022 1800   GLUCOSEU NEGATIVE 03/13/2022 1800   HGBUR SMALL (A) 03/13/2022 1800   BILIRUBINUR NEGATIVE 03/13/2022 1800   KETONESUR NEGATIVE 03/13/2022 1800   PROTEINUR NEGATIVE 03/13/2022 1800   NITRITE NEGATIVE 03/13/2022 1800   LEUKOCYTESUR NEGATIVE 03/13/2022 1800    Sepsis Labs: Lactic Acid, Venous No results found for: "LATICACIDVEN"  MICROBIOLOGY: No results found for this or any previous visit (from the past 240 hour(s)).  RADIOLOGY STUDIES/RESULTS: CT HEAD WO CONTRAST (5MM)  Result Date: 03/13/2022 CLINICAL DATA:  Head trauma, minor  (Age >= 65y); Neck trauma (Age >= 65y) EXAM: CT HEAD WITHOUT CONTRAST CT CERVICAL SPINE WITHOUT CONTRAST TECHNIQUE: Multidetector CT imaging of the head and cervical spine was performed following the standard protocol without intravenous contrast. Multiplanar CT image reconstructions of the cervical spine were also generated. RADIATION DOSE REDUCTION: This exam was performed according to the departmental dose-optimization program which includes automated exposure control, adjustment of the mA and/or kV according to patient size and/or use of iterative reconstruction technique. COMPARISON:  CTA head/neck 01/30/2019. FINDINGS: CT HEAD FINDINGS Brain: No evidence of acute infarction, hemorrhage, hydrocephalus, extra-axial collection or mass lesion/mass effect. Patchy white matter hypodensities, nonspecific but compatible with chronic microvascular ischemic disease. Vascular: No hyperdense vessel identified. Calcific intracranial atherosclerosis. Skull: No acute fracture. Sinuses/Orbits: Periapical lucency of a left maxillary molar with inferior left maxillary sinus mucosal thickening, possibly odontogenic. No acute orbital findings. Other: No mastoid effusions. CT CERVICAL SPINE FINDINGS Alignment: Similar anterolisthesis of C4 on  C5. No new sagittal subluxation. Levocurvature. Skull base and vertebrae: Vertebral body heights are maintained. No evidence of acute fracture. Soft tissues and spinal canal: No prevertebral fluid or swelling. No visible canal hematoma. Disc levels: Severe multilevel degenerative change including severe degenerative disease at is greatest at C6-C7, multilevel ankylosis and facet arthropathy and craniocervical degenerative change. Upper chest: Biapical pleuroparenchymal scarring. Otherwise, visualized lung apices are clear. IMPRESSION: 1. No evidence of acute intracranial abnormality. 2. No evidence of acute fracture or traumatic malalignment in the cervical spine. Electronically Signed   By:  Margaretha Sheffield M.D.   On: 03/13/2022 13:44   CT Cervical Spine Wo Contrast  Result Date: 03/13/2022 CLINICAL DATA:  Head trauma, minor (Age >= 65y); Neck trauma (Age >= 65y) EXAM: CT HEAD WITHOUT CONTRAST CT CERVICAL SPINE WITHOUT CONTRAST TECHNIQUE: Multidetector CT imaging of the head and cervical spine was performed following the standard protocol without intravenous contrast. Multiplanar CT image reconstructions of the cervical spine were also generated. RADIATION DOSE REDUCTION: This exam was performed according to the departmental dose-optimization program which includes automated exposure control, adjustment of the mA and/or kV according to patient size and/or use of iterative reconstruction technique. COMPARISON:  CTA head/neck 01/30/2019. FINDINGS: CT HEAD FINDINGS Brain: No evidence of acute infarction, hemorrhage, hydrocephalus, extra-axial collection or mass lesion/mass effect. Patchy white matter hypodensities, nonspecific but compatible with chronic microvascular ischemic disease. Vascular: No hyperdense vessel identified. Calcific intracranial atherosclerosis. Skull: No acute fracture. Sinuses/Orbits: Periapical lucency of a left maxillary molar with inferior left maxillary sinus mucosal thickening, possibly odontogenic. No acute orbital findings. Other: No mastoid effusions. CT CERVICAL SPINE FINDINGS Alignment: Similar anterolisthesis of C4 on C5. No new sagittal subluxation. Levocurvature. Skull base and vertebrae: Vertebral body heights are maintained. No evidence of acute fracture. Soft tissues and spinal canal: No prevertebral fluid or swelling. No visible canal hematoma. Disc levels: Severe multilevel degenerative change including severe degenerative disease at is greatest at C6-C7, multilevel ankylosis and facet arthropathy and craniocervical degenerative change. Upper chest: Biapical pleuroparenchymal scarring. Otherwise, visualized lung apices are clear. IMPRESSION: 1. No evidence of  acute intracranial abnormality. 2. No evidence of acute fracture or traumatic malalignment in the cervical spine. Electronically Signed   By: Margaretha Sheffield M.D.   On: 03/13/2022 13:44   DG Pelvis Portable  Result Date: 03/13/2022 CLINICAL DATA:  Trauma, fall EXAM: PORTABLE PELVIS 1-2 VIEWS COMPARISON:  None Available. FINDINGS: There is previous right hip arthroplasty. There is previous internal fixation in the neck of left femur with surgical screws. No recent displaced fracture is seen. Extensive arterial calcifications are seen. Degenerative changes are noted in lower lumbar spine. IMPRESSION: No displaced fracture is seen in pelvis. Other findings as described in the body of the report. Electronically Signed   By: Elmer Picker M.D.   On: 03/13/2022 12:41   DG Chest Portable 1 View  Result Date: 03/13/2022 CLINICAL DATA:  Trauma, fall EXAM: PORTABLE CHEST 1 VIEW COMPARISON:  02/08/2019 FINDINGS: Transverse diameter of heart is increased. Pacemaker battery is seen in the left infraclavicular region. There are no signs of pulmonary edema or focal pulmonary consolidation. There is no pleural effusion or pneumothorax. IMPRESSION: Cardiomegaly. There are no signs of pulmonary edema or focal pulmonary consolidation. There is no pleural effusion or pneumothorax. Electronically Signed   By: Elmer Picker M.D.   On: 03/13/2022 12:40     LOS: 1 day   Oren Binet, MD  [facility]    To contact the attending  provider between 7A-7P or the covering provider during after hours 7P-7A, please log into the web site [email or web address] and access using universal [facility] password for that web site. If you do not have the password, please call the hospital operator.  03/14/2022, 9:44 AM

## 2022-03-14 NOTE — H&P (Signed)
History and Physical    PLEASE NOTE THAT DRAGON DICTATION SOFTWARE WAS USED IN THE CONSTRUCTION OF THIS NOTE.   Megan Olsen WPY:[id number] DOB: 1927/09/02 DOA: 03/13/2022  PCP: Farrel Conners, MD  Patient coming from: home   I have personally briefly reviewed patient's old medical records in [place]  Chief Complaint: generalized weakness  HPI: Megan Olsen is a 88 y.o. female with medical history significant for dementia, paroxysmal atrial fibrillation complicated by sick sinus syndrome status post pacemaker placement chronically anticoagulated on Eliquis, essential hypertension, chronic iron deficiency anemia associated baseline hemoglobin 10-12, who is admitted to [facility] on 03/13/2022 with generalized weakness after presenting from home to [facility] ED complaining of ground-level fall.   In the setting of the patient's underlying dementia, the following history is provided by the patient's niece, with whom the patient lives, in addition to my discussions with the EDP and via chart review.  At baseline, the patient is able to provide some contribution to completion of her ADLs, ambulate with standby assist, and able to assist with transfers.  However, over the last few days, in the setting of generalized weakness, she has been requiring increased assistance with all of the above.  Not associate with any acute focal weakness nor any reported acute focal numbness or paresthesias.  No recent fevers, chills, rigors.  No report of any recent vomiting, diarrhea, rash.   However, in the setting of this generalized weakness, the patient reportedly tripped and fell at home earlier today, striking her head on the floor as a companion of this fall.  Not associated with any loss of consciousness.  Downtime on was reported to be less than 30 minutes, prior to arrival of EMS who subsequently brought the patient to [facility] emergency department for further evaluation and management  thereof.  In the setting of a history of paroxysmal atrial fibrillation, the patient is chronically anticoag on Eliquis.  Otherwise, not on any blood thinners as an outpatient.  Per chart review, the patient is a history of chronic iron deficiency anemia associated baseline hemoglobin range 10-12.     ED Course:  Vital signs in the ED were notable for the following: Afebrile; heart rate 50-62, blood pressure 163/80; respiratory rate 16-18, oxygen saturations 99% on room air.  Labs were notable for the following: CMP notable for the following: Sodium 134, creatinine 0.77, glucose 110.  CPK level 759.  Urinalysis showed no white blood cells and no bacteria, will demonstrating small hemoglobin in the absence of any red blood cells.  Serum magnesium of 1.6.  CBC notable for white cell count 7200, hemoglobin 11.5  Imaging and additional notable ED work-up: EKG showed ventricular paced rhythm.  Chest x-ray showed cardiomegaly without evidence of acute cardiopulmonary process, including no evidence of infiltrate, edema, effusion, or pneumothorax.  Plain film of the pelvis showed no evidence of acute fracture.  CT head showed no evidence of acute intracranial process, including no evidence of intracranial hemorrhage or any evidence of acute infarct will suggested will spine showed no evidence of acute cervical fracture or subluxation injury.  While in the ED, the following were administered: None  Subsequently, the patient was admitted for further evaluation management of her generalized weakness leading to ground-level mechanical fall, with presenting labs notable for hypomagnesemia as well as mildly elevated CPK level.     Review of Systems: As per HPI otherwise 10 point review of systems negative.   Past Medical History:  Diagnosis Date   Age-related macular degeneration, wet, both eyes ([place])    Anxiety disorder 07/06/2014   Arthritis    "all over" (04/27/2018)   Atrial fibrillation ([place])     catheter ablation of SVT in 2002   Benign essential HTN 07/06/2014   Constipation, intermittent 08/04/2007   CVA (cerebrovascular accident) ([place]) 2003   left brain; denies residual on 04/27/2018   Depression    GERD (gastroesophageal reflux disease)    Heart murmur    hx (04/27/2018)   Hypothyroidism 09/07/2014   "off RX now" (04/27/2018)   Iron deficiency anemia    Iron deficiency anemia, unspecified 01/02/2013   Osteoarthritis    Osteoporosis 01/11/2007   Pneumonia    "one time; years and years ago" (04/27/2018)   Presence of permanent cardiac pacemaker 04/27/2018    Dual Chamber   Right BBB/left ant fasc block     Past Surgical History:  Procedure Laterality Date   APPENDECTOMY     CATARACT EXTRACTION W/ INTRAOCULAR LENS  IMPLANT, BILATERAL Bilateral    CHOLECYSTECTOMY OPEN     COLONOSCOPY     EYE SURGERY     FRACTURE SURGERY     HIP PINNING,CANNULATED Left 07/07/2014   Procedure: CANNULATED HIP PINNING;  Surgeon: Mauri Pole, MD;  Location: [facility];  Service: Orthopedics;  Laterality: Left;   INSERT / REPLACE / REMOVE PACEMAKER  04/27/2018    Dual Chamber   JOINT REPLACEMENT     LAPAROSCOPIC OVARIAN CYSTECTOMY     LEFT HEART CATH AND CORONARY ANGIOGRAPHY N/A 01/31/2019   Procedure: LEFT HEART CATH AND CORONARY ANGIOGRAPHY;  Surgeon: Leonie Man, MD;  Location: [place] [facility];  Service: Cardiovascular;  Laterality: N/A;   NASAL SEPTUM SURGERY     PACEMAKER IMPLANT N/A 04/27/2018   Procedure: PACEMAKER IMPLANT - Dual Chamber;  Surgeon: Deboraha Sprang, MD;  Location: [place] [facility];  Service: Cardiovascular;  Laterality: N/A;   SVT ABLATION  2002   TONSILLECTOMY     TOTAL ABDOMINAL HYSTERECTOMY     TOTAL HIP ARTHROPLASTY Right 02/23/2017   Procedure: RIGHT TOTAL HIP ARTHROPLASTY ANTERIOR APPROACH;  Surgeon: Paralee Cancel, MD;  Location: [facility];  Service: Orthopedics;  Laterality: Right;   VITRECTOMY Right 2008   dr Zadie Rhine.  Vitrectomy and removal of tissue.      Social History:  reports that she has never smoked. She has never been exposed to tobacco smoke. She has never used smokeless tobacco. She reports that she does not currently use alcohol. She reports that she does not use drugs.   Allergies  Allergen Reactions   Propoxyphene N-Acetaminophen Nausea Only   Celecoxib Itching and Rash   Fexofenadine Rash   Penicillins Hives and Rash    Has patient had a PCN reaction causing immediate rash, facial/tongue/throat swelling, SOB or lightheadedness with hypotension: Yes Has patient had a PCN reaction causing severe rash involving mucus membranes or skin necrosis: No Has patient had a PCN reaction that required hospitalization: No Has patient had a PCN reaction occurring within the last 10 years: No If all of the above answers are "NO", then may proceed with Cephalosporin use.     Family History  Problem Relation Age of Onset   Early death Mother    Nephritis Mother    Kidney disease Mother    Heart attack Father    Heart disease Sister    Dementia Sister    Heart attack Brother    Dementia Sister  Family history reviewed and not pertinent    Prior to Admission medications   Medication Sig Start Date End Date Taking? Authorizing Provider  acetaminophen (TYLENOL) 650 MG CR tablet Take 650 mg by mouth every 8 (eight) hours as needed for pain.     [provider]  apixaban (ELIQUIS) 2.5 MG TABS tablet TAKE 1 TABLET TWICE DAILY 01/19/22   Deboraha Sprang, MD  bisoprolol-hydrochlorothiazide [facility]) 5-6.25 MG tablet Take 1 tablet by mouth 2 (two) times daily.    [provider]  dexlansoprazole (DEXILANT) 60 MG capsule TAKE 1 CAPSULE EVERY DAY 11/12/21   Koberlein, Junell C, MD  fluticasone (FLONASE) 50 MCG/ACT nasal spray PLACE 2 SPRAYS INTO BOTH NOSTRILS DAILY. Patient taking differently: Place 2 sprays into both nostrils as needed. 09/05/21   Koberlein, Steele Berg, MD  LORazepam (ATIVAN) 1 MG tablet TAKE 1 TABLET BY  MOUTH AT BEDTIME AS NEEDED FOR ANXIETY OR SLEEP 01/19/22   Dutch Quint B, FNP  losartan (COZAAR) 25 MG tablet Take 1 tablet (25 mg total) by mouth daily. 06/27/21   Caren Macadam, MD  Multiple Vitamins-Minerals (PRESERVISION AREDS 2) CAPS Take 1 capsule by mouth 2 (two) times daily. 04/15/17   Marletta Lor, MD  nitroGLYCERIN (NITROSTAT) 0.4 MG SL tablet Place 1 tablet (0.4 mg total) under the tongue every 5 (five) minutes x 3 doses as needed for chest pain. 02/01/19   Nita Sells, MD  NON FORMULARY Place 1 Dose into both eyes See admin instructions. Receives injections into both eyes every 5-8 weeks at Dr. Serita Grit office [facility])    [provider]  traZODone (DESYREL) 100 MG tablet Take 1 tablet (100 mg total) by mouth at bedtime. 06/27/21   Caren Macadam, MD     Objective    Physical Exam: Vitals:   03/13/22 2229 03/13/22 2229 03/13/22 2300 03/14/22 0302  BP: (!) 175/58  (!) 177/92 (!) 151/57  Pulse: 74  (!) 51 67  Resp: '18  18 18  '$ Temp:  97.6 F (36.4 C) 98.8 F (37.1 C) (!) 97.4 F (36.3 C)  TempSrc:  Oral Oral Oral  SpO2: 98%  99% 98%  Weight:      Height:        General: appears to be stated age; alert, confused Skin: warm, dry, no rash Head:  AT/[place] Mouth:  Oral mucosa membranes appear moist, normal dentition Neck: supple; trachea midline Heart:  RRR; did not appreciate any M/R/G Lungs: CTAB, did not appreciate any wheezes, rales, or rhonchi Abdomen: + BS; soft, ND, NT Vascular: 2+ pedal pulses b/l; 2+ radial pulses b/l Extremities: no peripheral edema, no muscle wasting Neuro: strength and sensation intact in upper and lower extremities b/l    Labs on Admission: I have personally reviewed following labs and imaging studies  CBC: Recent Labs  Lab 03/13/22 1130  WBC 7.2  NEUTROABS 6.1  HGB 11.5*  HCT 34.3*  MCV 93.5  PLT 970*   Basic Metabolic Panel: Recent Labs  Lab 03/13/22 1130  NA 135  K 4.0  CL  101  CO2 25  GLUCOSE 110*  BUN 13  CREATININE 0.77  CALCIUM 8.7*  MG 1.6*   GFR: Estimated Creatinine Clearance: 35.3 mL/min (by C-G formula based on SCr of 0.77 mg/dL). Liver Function Tests: Recent Labs  Lab 03/13/22 1130  AST 43*  ALT 30  ALKPHOS 105  BILITOT 0.6  PROT 5.7*  ALBUMIN 3.3*   No results for input(s): "LIPASE", "AMYLASE"  in the last 168 hours. No results for input(s): "AMMONIA" in the last 168 hours. Coagulation Profile: No results for input(s): "INR", "PROTIME" in the last 168 hours. Cardiac Enzymes: Recent Labs  Lab 03/13/22 1130  CKTOTAL 759*   BNP (last 3 results) No results for input(s): "PROBNP" in the last 8760 hours. HbA1C: No results for input(s): "HGBA1C" in the last 72 hours. CBG: No results for input(s): "GLUCAP" in the last 168 hours. Lipid Profile: No results for input(s): "CHOL", "HDL", "LDLCALC", "TRIG", "CHOLHDL", "LDLDIRECT" in the last 72 hours. Thyroid Function Tests: No results for input(s): "TSH", "T4TOTAL", "FREET4", "T3FREE", "THYROIDAB" in the last 72 hours. Anemia Panel: No results for input(s): "VITAMINB12", "FOLATE", "FERRITIN", "TIBC", "IRON", "RETICCTPCT" in the last 72 hours. Urine analysis:    Component Value Date/Time   COLORURINE YELLOW 03/13/2022 1800   APPEARANCEUR CLEAR 03/13/2022 1800   LABSPEC 1.009 03/13/2022 1800   PHURINE 6.0 03/13/2022 1800   GLUCOSEU NEGATIVE 03/13/2022 1800   HGBUR SMALL (A) 03/13/2022 1800   BILIRUBINUR NEGATIVE 03/13/2022 1800   KETONESUR NEGATIVE 03/13/2022 1800   PROTEINUR NEGATIVE 03/13/2022 1800   NITRITE NEGATIVE 03/13/2022 1800   LEUKOCYTESUR NEGATIVE 03/13/2022 1800    Radiological Exams on Admission: CT HEAD WO CONTRAST (5MM)  Result Date: 03/13/2022 CLINICAL DATA:  Head trauma, minor (Age >= 65y); Neck trauma (Age >= 65y) EXAM: CT HEAD WITHOUT CONTRAST CT CERVICAL SPINE WITHOUT CONTRAST TECHNIQUE: Multidetector CT imaging of the head and cervical spine was performed  following the standard protocol without intravenous contrast. Multiplanar CT image reconstructions of the cervical spine were also generated. RADIATION DOSE REDUCTION: This exam was performed according to the departmental dose-optimization program which includes automated exposure control, adjustment of the mA and/or kV according to patient size and/or use of iterative reconstruction technique. COMPARISON:  CTA head/neck 01/30/2019. FINDINGS: CT HEAD FINDINGS Brain: No evidence of acute infarction, hemorrhage, hydrocephalus, extra-axial collection or mass lesion/mass effect. Patchy white matter hypodensities, nonspecific but compatible with chronic microvascular ischemic disease. Vascular: No hyperdense vessel identified. Calcific intracranial atherosclerosis. Skull: No acute fracture. Sinuses/Orbits: Periapical lucency of a left maxillary molar with inferior left maxillary sinus mucosal thickening, possibly odontogenic. No acute orbital findings. Other: No mastoid effusions. CT CERVICAL SPINE FINDINGS Alignment: Similar anterolisthesis of C4 on C5. No new sagittal subluxation. Levocurvature. Skull base and vertebrae: Vertebral body heights are maintained. No evidence of acute fracture. Soft tissues and spinal canal: No prevertebral fluid or swelling. No visible canal hematoma. Disc levels: Severe multilevel degenerative change including severe degenerative disease at is greatest at C6-C7, multilevel ankylosis and facet arthropathy and craniocervical degenerative change. Upper chest: Biapical pleuroparenchymal scarring. Otherwise, visualized lung apices are clear. IMPRESSION: 1. No evidence of acute intracranial abnormality. 2. No evidence of acute fracture or traumatic malalignment in the cervical spine. Electronically Signed   By: Margaretha Sheffield M.D.   On: 03/13/2022 13:44   CT Cervical Spine Wo Contrast  Result Date: 03/13/2022 CLINICAL DATA:  Head trauma, minor (Age >= 65y); Neck trauma (Age >= 65y) EXAM:  CT HEAD WITHOUT CONTRAST CT CERVICAL SPINE WITHOUT CONTRAST TECHNIQUE: Multidetector CT imaging of the head and cervical spine was performed following the standard protocol without intravenous contrast. Multiplanar CT image reconstructions of the cervical spine were also generated. RADIATION DOSE REDUCTION: This exam was performed according to the departmental dose-optimization program which includes automated exposure control, adjustment of the mA and/or kV according to patient size and/or use of iterative reconstruction technique. COMPARISON:  CTA head/neck 01/30/2019. FINDINGS:  CT HEAD FINDINGS Brain: No evidence of acute infarction, hemorrhage, hydrocephalus, extra-axial collection or mass lesion/mass effect. Patchy white matter hypodensities, nonspecific but compatible with chronic microvascular ischemic disease. Vascular: No hyperdense vessel identified. Calcific intracranial atherosclerosis. Skull: No acute fracture. Sinuses/Orbits: Periapical lucency of a left maxillary molar with inferior left maxillary sinus mucosal thickening, possibly odontogenic. No acute orbital findings. Other: No mastoid effusions. CT CERVICAL SPINE FINDINGS Alignment: Similar anterolisthesis of C4 on C5. No new sagittal subluxation. Levocurvature. Skull base and vertebrae: Vertebral body heights are maintained. No evidence of acute fracture. Soft tissues and spinal canal: No prevertebral fluid or swelling. No visible canal hematoma. Disc levels: Severe multilevel degenerative change including severe degenerative disease at is greatest at C6-C7, multilevel ankylosis and facet arthropathy and craniocervical degenerative change. Upper chest: Biapical pleuroparenchymal scarring. Otherwise, visualized lung apices are clear. IMPRESSION: 1. No evidence of acute intracranial abnormality. 2. No evidence of acute fracture or traumatic malalignment in the cervical spine. Electronically Signed   By: Margaretha Sheffield M.D.   On: 03/13/2022 13:44    DG Pelvis Portable  Result Date: 03/13/2022 CLINICAL DATA:  Trauma, fall EXAM: PORTABLE PELVIS 1-2 VIEWS COMPARISON:  None Available. FINDINGS: There is previous right hip arthroplasty. There is previous internal fixation in the neck of left femur with surgical screws. No recent displaced fracture is seen. Extensive arterial calcifications are seen. Degenerative changes are noted in lower lumbar spine. IMPRESSION: No displaced fracture is seen in pelvis. Other findings as described in the body of the report. Electronically Signed   By: Elmer Picker M.D.   On: 03/13/2022 12:41   DG Chest Portable 1 View  Result Date: 03/13/2022 CLINICAL DATA:  Trauma, fall EXAM: PORTABLE CHEST 1 VIEW COMPARISON:  02/08/2019 FINDINGS: Transverse diameter of heart is increased. Pacemaker battery is seen in the left infraclavicular region. There are no signs of pulmonary edema or focal pulmonary consolidation. There is no pleural effusion or pneumothorax. IMPRESSION: Cardiomegaly. There are no signs of pulmonary edema or focal pulmonary consolidation. There is no pleural effusion or pneumothorax. Electronically Signed   By: Elmer Picker M.D.   On: 03/13/2022 12:40       Assessment/Plan   Principal Problem:   Generalized weakness Active Problems:   Chronic iron deficiency anemia   A-fib ([place]), s/p catheter ablation of SVT in 2002   Anxiety disorder   Fall at home, initial encounter   Elevated CPK   Hypomagnesemia       #) Generalized weakness: Recently impacting patient's ability to contribute to her ADLs as well as affecting her mobility, as further qualified above.  Etiology not entirely clear at this time.  No evidence of underlying infectious process.  Has a documented history of acquired hypothyroidism, for which she is not currently on Synthroid.  Will check TSH.  She is also noted to have a mildly elevated CPK along with urinalysis that shows small hemoglobin without RBCs.   Consequently, we will further trend CPK level.  Plan: Fall precautions ordered.  PT/OT consults placed.  Gentle IV fluids.  CPK in the morning.  Check TSH.  Repeat CMP and CBC in the morning.       #), Mechanical fall: Occurred at home earlier in the day, hitting her head as a component of this fall, without associated loss of consciousness.  Of note she is chronically anticoagulated on Eliquis, with ensuing CT head showed no evidence of acute process.  Suspect contribution from previously described generalized weakness.  Plan:  Fall precautions ordered.  Further evaluation management of generalized weakness, as above.        #) Hypo-magnesium Mia: Presenting serum magnesium level noted to be 1.6.   Plan: Magnesium sulfate 2 g IV over 2 hours x 1 dose now.  Repeat serum magnesium level in the morning.        #) Elevated CPK: Presenting CPK found to be 80 759, with urinalysis notable for small hemoglobin without RBCs.  Per history, it does not appear that the patient remained on the floor for prolonged period of time following her from a mechanical fall at home earlier in the day.  However, given unclear trajectory of this mildly elevated CPK level, along with urinalysis results that could potentially be indicative of early myoglobinuria, will continue to trend CPK level while providing gentle IV fluids, as below.  Plan: Continuous NS at 75 cc an hour x8 hours to repeat CPK level in the morning.  Pete CMP in the morning.            #) Paroxysmal atrial fibrillation: Documented history of such. In setting of CHA2DS2-VASc score of 5, there is an indication for chronic anticoagulation for thromboembolic prophylaxis. Consistent with this, patient is chronically anticoagulated on Eliquis. Home AV nodal blocking regimen: Bisoprolol.  This complicated by sick sinus syndrome status post pacemaker placement in 2019 .presenting EKG reflects ventricular paced rhythm.    Plan:  monitor strict I's & O's and daily weights. Repeat BMP/CBC in AM. Continue home AV nodal blocking regimen.  Continue home Eliquis.            #) Essential Hypertension: documented h/o such, with outpatient antihypertensive regimen including bisoprolol, losartan, HCTZ.  SBP's in the ED today: In the 160s mmHg. we will hold home HCTZ for now given efforts towards rehydration in the setting of mildly elevated initial CPK level.  Plan: Close monitoring of subsequent BP via routine VS. resume home losartan, beta-blocker, holding home HCTZ, as above.         #) Generalized anxiety disorder: Documented history of such, on as needed Ativan as an outpatient.  Of note, not on any SSRI or SNRI at home.  Plan: Continue outpatient Ativan 0.5 mg p.o. every 24 hours as needed for anxiety/sleep.        #) Chronic iron deficiency anemia: Documented history of such, associated baseline hemoglobin range 10-12, with present hemoglobin consistent with this range, in the absence of any evidence of overt bleed.  Plan: Repeat CBC in the morning.            DVT prophylaxis: SCD's plus continuation of home Eliquis Code Status: DNR (in the setting of patient's dementia, this is presumed, given active most document from November 2021 reflecting DNR status as well as review of documentation from most recent prior hospitalization in July 2020, at which time the patient was also DNR) Disposition Plan: Per Rounding Team Consults called: none;  Admission status: Inpatient    PLEASE NOTE THAT DRAGON DICTATION SOFTWARE WAS USED IN THE CONSTRUCTION OF THIS NOTE.   [place] DO [facility]  From [place]   03/14/2022, 5:24 AM

## 2022-03-15 ENCOUNTER — Inpatient Hospital Stay (HOSPITAL_COMMUNITY): Payer: Medicare HMO

## 2022-03-15 DIAGNOSIS — R531 Weakness: Secondary | ICD-10-CM | POA: Diagnosis not present

## 2022-03-15 LAB — CBC WITH DIFFERENTIAL/PLATELET
Abs Immature Granulocytes: 0.01 10*3/uL (ref 0.00–0.07)
Basophils Absolute: 0 10*3/uL (ref 0.0–0.1)
Basophils Relative: 0 %
Eosinophils Absolute: 0.2 10*3/uL (ref 0.0–0.5)
Eosinophils Relative: 4 %
HCT: 35.5 % — ABNORMAL LOW (ref 36.0–46.0)
Hemoglobin: 12.3 g/dL (ref 12.0–15.0)
Immature Granulocytes: 0 %
Lymphocytes Relative: 13 %
Lymphs Abs: 0.6 10*3/uL — ABNORMAL LOW (ref 0.7–4.0)
MCH: 31.5 pg (ref 26.0–34.0)
MCHC: 34.6 g/dL (ref 30.0–36.0)
MCV: 91 fL (ref 80.0–100.0)
Monocytes Absolute: 0.6 10*3/uL (ref 0.1–1.0)
Monocytes Relative: 13 %
Neutro Abs: 3.3 10*3/uL (ref 1.7–7.7)
Neutrophils Relative %: 70 %
Platelets: 120 10*3/uL — ABNORMAL LOW (ref 150–400)
RBC: 3.9 MIL/uL (ref 3.87–5.11)
RDW: 13.4 % (ref 11.5–15.5)
WBC: 4.7 10*3/uL (ref 4.0–10.5)
nRBC: 0 % (ref 0.0–0.2)

## 2022-03-15 LAB — BASIC METABOLIC PANEL
Anion gap: 8 (ref 5–15)
BUN: 9 mg/dL (ref 8–23)
CO2: 23 mmol/L (ref 22–32)
Calcium: 8.5 mg/dL — ABNORMAL LOW (ref 8.9–10.3)
Chloride: 106 mmol/L (ref 98–111)
Creatinine, Ser: 0.82 mg/dL (ref 0.44–1.00)
GFR, Estimated: >60 mL/min (ref >60)
Glucose, Bld: 89 mg/dL (ref 70–99)
Potassium: 3.8 mmol/L (ref 3.5–5.1)
Sodium: 137 mmol/L (ref 135–145)

## 2022-03-15 LAB — CK: Total CK: 414 U/L — ABNORMAL HIGH (ref 38–234)

## 2022-03-15 LAB — MAGNESIUM: Magnesium: 1.8 mg/dL (ref 1.7–2.4)

## 2022-03-15 MED ORDER — AMLODIPINE BESYLATE 10 MG PO TABS
10.0000 mg | ORAL_TABLET | Freq: Every day | ORAL | Status: DC
  Administered 2022-03-15 – 2022-03-17 (×3): 10 mg via ORAL
  Filled 2022-03-15 (×3): qty 1

## 2022-03-15 MED ORDER — HALOPERIDOL LACTATE 5 MG/ML IJ SOLN
2.5000 mg | Freq: Once | INTRAMUSCULAR | Status: DC
Start: 2022-03-15 — End: 2022-03-15

## 2022-03-15 MED ORDER — POTASSIUM CHLORIDE 20 MEQ PO PACK
20.0000 meq | PACK | Freq: Once | ORAL | Status: AC
  Administered 2022-03-15: 20 meq via ORAL
  Filled 2022-03-15: qty 1

## 2022-03-15 MED ORDER — MAGNESIUM SULFATE 2 GM/50ML IV SOLN
2.0000 g | Freq: Once | INTRAVENOUS | Status: AC
Start: 2022-03-15 — End: 2022-03-15
  Administered 2022-03-15: 2 g via INTRAVENOUS
  Filled 2022-03-15: qty 50

## 2022-03-15 MED ORDER — HYDRALAZINE HCL 20 MG/ML IJ SOLN
10.0000 mg | Freq: Four times a day (QID) | INTRAMUSCULAR | Status: DC | PRN
  Administered 2022-03-15: 10 mg via INTRAVENOUS
  Filled 2022-03-15: qty 1

## 2022-03-15 NOTE — Progress Notes (Addendum)
PROGRESS NOTE        PATIENT DETAILS Name: Megan Olsen Age: 88 y.o. Sex: female Date of Birth: 10/30/1927 Admit Date: 03/13/2022 Admitting Physician Rhetta Mura, DO GUR:[id number], Royston Cowper, MD  Brief Summary: Patient is a 88 y.o.  female with history of PAF on Eliquis, sick sinus syndrome-s/p PPM placement, dementia, HTN, chronic anemia-who presented to the ED following a mechanical fall.  She was subsequently admitted for further evaluation and treatment.  Significant events: 8/25>> mechanical fall-admit to [facility].  Significant studies: 8/25>> CXR: No PNA 8/25>> x-ray pelvis: No fracture 8/25>> CT head: No acute intracranial abnormality. 8/25>> CT C-spine: No fracture.  Significant microbiology data: None  Procedures: None  Consults: None  Subjective:  Patient in bed, appears comfortable, denies any headache, no fever, no chest pain or pressure, no shortness of breath , no abdominal pain. No new focal weakness.   Objective: Vitals: Blood pressure (!) 163/75, pulse 66, temperature (!) 97.5 F (36.4 C), temperature source Oral, resp. rate 18, height '5\' 3"'$  (1.6 m), weight 50 kg, SpO2 97 %.   Exam:  Awake Alert, No new F.N deficits, mild L facial droop - [name],PERRAL Supple Neck, No JVD,   Symmetrical Chest wall movement, Good air movement bilaterally, CTAB RRR,No Gallops, Rubs or new Murmurs,  +ve B.Sounds, Abd Soft, No tenderness,   No Cyanosis, Clubbing or edema   Assessment/Plan:  Mechanical fall: Denies syncope-imaging negative for fractures-await PT/OT eval. Head CT on admission unremarkable, no focal deficits, old mild left-sided facial droop, repeat head CT on 03/15/2022.  Rhabdomyolysis: Mild-stop IVF-follow periodically.  CK improving.  Hypomagnesemia and hypokalemia.  Replace.  Thrombocytopenia: Mild-stable for outpatient follow-up.  Chronic iron deficiency anemia: Stable for ongoing monitoring in the outpatient  setting.  Appears to be mild.  PAF: Paced rhythm-continue bisoprolol-on Eliquis.  HTN: BP stable-continue losartan/beta-blocker-continue to hold HCTZ.  Added Norvasc for better control.  Generalized anxiety disorder: Appears stable-continue as needed Ativan.  Dementia: Relatively awake/alert-maintain delirium precautions  Underweight: Estimated body mass index is 19.53 kg/m as calculated from the following:   Height as of this encounter: '5\' 3"'$  (1.6 m).   Weight as of this encounter: 50 kg.   Code status:   Code Status: DNR   DVT Prophylaxis: apixaban (ELIQUIS) tablet 2.5 mg Start: 03/14/22 1000 SCDs Start: 03/13/22 2109 apixaban (ELIQUIS) tablet 2.5 mg     Family Communication: Niece-Andrea Cook-[phone]-updated over the phone on 03/15/2022, she also looked at her aunt and does not think her facial asymmetry is new.   Disposition Plan: Status is: Inpatient Remains inpatient appropriate because: Mechanical fall-needs SNF-lives alone-unsafe discharge.  Awaiting SNF bed.   Planned Discharge Destination:Skilled nursing facility   Diet: Diet Order             Diet regular Room service appropriate? Yes; Fluid consistency: Thin  Diet effective now                     Antimicrobial agents: Anti-infectives (From admission, onward)    None        MEDICATIONS: Scheduled Meds:  amLODipine  10 mg Oral Daily   apixaban  2.5 mg Oral BID   bisoprolol  5 mg Oral BID   losartan  25 mg Oral Daily   Continuous Infusions:   PRN Meds:.acetaminophen **OR** acetaminophen, hydrALAZINE, LORazepam  I have personally reviewed following labs and imaging studies  LABORATORY DATA:  Recent Labs  Lab 03/13/22 1130 03/14/22 0613 03/15/22 0328  WBC 7.2 5.4 4.7  HGB 11.5* 12.1 12.3  HCT 34.3* 34.7* 35.5*  PLT 110* 129* 120*  MCV 93.5 90.6 91.0  MCH 31.3 31.6 31.5  MCHC 33.5 34.9 34.6  RDW 13.1 13.2 13.4  LYMPHSABS 0.3* 0.4* 0.6*  MONOABS 0.7 0.4 0.6  EOSABS  0.1 0.1 0.2  BASOSABS 0.0 0.0 0.0    Recent Labs  Lab 03/13/22 1130 03/14/22 0613 03/15/22 0328  NA 135 137 137  K 4.0 3.4* 3.8  CL 101 106 106  CO2 '25 23 23  '$ GLUCOSE 110* 82 89  BUN '13 11 9  '$ CREATININE 0.77 0.77 0.82  CALCIUM 8.7* 8.7* 8.5*  AST 43* 36  --   ALT 30 26  --   ALKPHOS 105 114  --   BILITOT 0.6 1.1  --   ALBUMIN 3.3* 3.3*  --   MG 1.6* 1.6* 1.8  TSH  --  4.439  --     MICROBIOLOGY: No results found for this or any previous visit (from the past 240 hour(s)).  RADIOLOGY STUDIES/RESULTS: CT HEAD WO CONTRAST (5MM)  Result Date: 03/13/2022 CLINICAL DATA:  Head trauma, minor (Age >= 65y); Neck trauma (Age >= 65y) EXAM: CT HEAD WITHOUT CONTRAST CT CERVICAL SPINE WITHOUT CONTRAST TECHNIQUE: Multidetector CT imaging of the head and cervical spine was performed following the standard protocol without intravenous contrast. Multiplanar CT image reconstructions of the cervical spine were also generated. RADIATION DOSE REDUCTION: This exam was performed according to the departmental dose-optimization program which includes automated exposure control, adjustment of the mA and/or kV according to patient size and/or use of iterative reconstruction technique. COMPARISON:  CTA head/neck 01/30/2019. FINDINGS: CT HEAD FINDINGS Brain: No evidence of acute infarction, hemorrhage, hydrocephalus, extra-axial collection or mass lesion/mass effect. Patchy white matter hypodensities, nonspecific but compatible with chronic microvascular ischemic disease. Vascular: No hyperdense vessel identified. Calcific intracranial atherosclerosis. Skull: No acute fracture. Sinuses/Orbits: Periapical lucency of a left maxillary molar with inferior left maxillary sinus mucosal thickening, possibly odontogenic. No acute orbital findings. Other: No mastoid effusions. CT CERVICAL SPINE FINDINGS Alignment: Similar anterolisthesis of C4 on C5. No new sagittal subluxation. Levocurvature. Skull base and vertebrae:  Vertebral body heights are maintained. No evidence of acute fracture. Soft tissues and spinal canal: No prevertebral fluid or swelling. No visible canal hematoma. Disc levels: Severe multilevel degenerative change including severe degenerative disease at is greatest at C6-C7, multilevel ankylosis and facet arthropathy and craniocervical degenerative change. Upper chest: Biapical pleuroparenchymal scarring. Otherwise, visualized lung apices are clear. IMPRESSION: 1. No evidence of acute intracranial abnormality. 2. No evidence of acute fracture or traumatic malalignment in the cervical spine. Electronically Signed   By: Margaretha Sheffield M.D.   On: 03/13/2022 13:44   CT Cervical Spine Wo Contrast  Result Date: 03/13/2022 CLINICAL DATA:  Head trauma, minor (Age >= 65y); Neck trauma (Age >= 65y) EXAM: CT HEAD WITHOUT CONTRAST CT CERVICAL SPINE WITHOUT CONTRAST TECHNIQUE: Multidetector CT imaging of the head and cervical spine was performed following the standard protocol without intravenous contrast. Multiplanar CT image reconstructions of the cervical spine were also generated. RADIATION DOSE REDUCTION: This exam was performed according to the departmental dose-optimization program which includes automated exposure control, adjustment of the mA and/or kV according to patient size and/or use of iterative reconstruction technique. COMPARISON:  CTA head/neck 01/30/2019. FINDINGS: CT HEAD FINDINGS Brain:  No evidence of acute infarction, hemorrhage, hydrocephalus, extra-axial collection or mass lesion/mass effect. Patchy white matter hypodensities, nonspecific but compatible with chronic microvascular ischemic disease. Vascular: No hyperdense vessel identified. Calcific intracranial atherosclerosis. Skull: No acute fracture. Sinuses/Orbits: Periapical lucency of a left maxillary molar with inferior left maxillary sinus mucosal thickening, possibly odontogenic. No acute orbital findings. Other: No mastoid effusions. CT  CERVICAL SPINE FINDINGS Alignment: Similar anterolisthesis of C4 on C5. No new sagittal subluxation. Levocurvature. Skull base and vertebrae: Vertebral body heights are maintained. No evidence of acute fracture. Soft tissues and spinal canal: No prevertebral fluid or swelling. No visible canal hematoma. Disc levels: Severe multilevel degenerative change including severe degenerative disease at is greatest at C6-C7, multilevel ankylosis and facet arthropathy and craniocervical degenerative change. Upper chest: Biapical pleuroparenchymal scarring. Otherwise, visualized lung apices are clear. IMPRESSION: 1. No evidence of acute intracranial abnormality. 2. No evidence of acute fracture or traumatic malalignment in the cervical spine. Electronically Signed   By: Margaretha Sheffield M.D.   On: 03/13/2022 13:44   DG Pelvis Portable  Result Date: 03/13/2022 CLINICAL DATA:  Trauma, fall EXAM: PORTABLE PELVIS 1-2 VIEWS COMPARISON:  None Available. FINDINGS: There is previous right hip arthroplasty. There is previous internal fixation in the neck of left femur with surgical screws. No recent displaced fracture is seen. Extensive arterial calcifications are seen. Degenerative changes are noted in lower lumbar spine. IMPRESSION: No displaced fracture is seen in pelvis. Other findings as described in the body of the report. Electronically Signed   By: Elmer Picker M.D.   On: 03/13/2022 12:41   DG Chest Portable 1 View  Result Date: 03/13/2022 CLINICAL DATA:  Trauma, fall EXAM: PORTABLE CHEST 1 VIEW COMPARISON:  02/08/2019 FINDINGS: Transverse diameter of heart is increased. Pacemaker battery is seen in the left infraclavicular region. There are no signs of pulmonary edema or focal pulmonary consolidation. There is no pleural effusion or pneumothorax. IMPRESSION: Cardiomegaly. There are no signs of pulmonary edema or focal pulmonary consolidation. There is no pleural effusion or pneumothorax. Electronically Signed    By: Elmer Picker M.D.   On: 03/13/2022 12:40     LOS: 2 days   Signature  Lala Lund M.D on 03/15/2022 at 10:35 AM   -  To page go to [email or web address]

## 2022-03-15 NOTE — Progress Notes (Addendum)
Pt HR 40's to 50's Beta blocker held and called provider for parameters. Pt requested PRN ativan as well and Provider ordered to give prn dose,  BP now increased to 204/63. Provider contacted and ordered to give prn high blood pressure, for systolic BP > 110 mmHg or diastolic BP > 211 mmHg. RN will give medication and  reassess.  RN at bedside to recheck BP before giving IV hydralazine. RN assess BP in right/ left arm with the monitor and twice manually. BP 163/100 and 162/68 manually. IV medication not given as parameters were not met. Pt is alert and aware and denies any distress or new s/s. Discussed plan with Agricultural consultant.

## 2022-03-16 ENCOUNTER — Inpatient Hospital Stay (HOSPITAL_COMMUNITY): Payer: Medicare HMO

## 2022-03-16 DIAGNOSIS — R531 Weakness: Secondary | ICD-10-CM | POA: Diagnosis not present

## 2022-03-16 LAB — CBC WITH DIFFERENTIAL/PLATELET
Abs Immature Granulocytes: 0.02 10*3/uL (ref 0.00–0.07)
Basophils Absolute: 0 10*3/uL (ref 0.0–0.1)
Basophils Relative: 1 %
Eosinophils Absolute: 0.2 10*3/uL (ref 0.0–0.5)
Eosinophils Relative: 5 %
HCT: 36 % (ref 36.0–46.0)
Hemoglobin: 12.1 g/dL (ref 12.0–15.0)
Immature Granulocytes: 0 %
Lymphocytes Relative: 14 %
Lymphs Abs: 0.6 10*3/uL — ABNORMAL LOW (ref 0.7–4.0)
MCH: 31.4 pg (ref 26.0–34.0)
MCHC: 33.6 g/dL (ref 30.0–36.0)
MCV: 93.5 fL (ref 80.0–100.0)
Monocytes Absolute: 0.6 10*3/uL (ref 0.1–1.0)
Monocytes Relative: 14 %
Neutro Abs: 3.1 10*3/uL (ref 1.7–7.7)
Neutrophils Relative %: 66 %
Platelets: 139 10*3/uL — ABNORMAL LOW (ref 150–400)
RBC: 3.85 MIL/uL — ABNORMAL LOW (ref 3.87–5.11)
RDW: 13.5 % (ref 11.5–15.5)
WBC: 4.6 10*3/uL (ref 4.0–10.5)
nRBC: 0 % (ref 0.0–0.2)

## 2022-03-16 NOTE — TOC Initial Note (Addendum)
Transition of Care [facility]) - Initial/Assessment Note    Patient Details  Name: Megan Olsen MRN: [id number] Date of Birth: 10-13-1927  [facility]) CM/SW Contact:    Benard Halsted, [place] Phone Number: 03/16/2022, 3:08 PM  Clinical Narrative:                 3pm-CSW left vm for therapy office to request updated PT note for insurance. CSW presented SNF bed offers to patient's niece, Megan Olsen. She has selected [facility] as patient has been there before and it is close to [place] to visit. CSW spoke with patient at bedside and she is in agreement to go to [facility] as well. [facility] able to accept patient. CSW will submit for insurance authorization once updated PT note is in.   6pm-CSW submitted clinicals to [facility] for [facility], Ref# [id number].   Expected Discharge Plan: Skilled Nursing Facility Barriers to Discharge: Insurance Authorization, Other (must enter comment) (Need PT note for insurance for SNF)   Patient Goals and CMS Choice Patient states their goals for this hospitalization and ongoing recovery are:: Transition to assisted living after SNF CMS Medicare.gov Compare Post Acute Care list provided to:: Patient Represenative (must comment) Choice offered to / list presented to : Patient, [facility] POA / Guardian  Expected Discharge Plan and Services Expected Discharge Plan: [place] In-house Referral: Clinical Social Work   Post Acute Care Choice: [place] Living arrangements for the past 2 months: [place]                                      Prior Living Arrangements/Services Living arrangements for the past 2 months: Single Family Home Lives with:: Self Patient language and need for interpreter reviewed:: Yes Do you feel safe going back to the place where you live?: Yes      Need for Family Participation in Patient Care: Yes (Comment) Care giver support system in place?: Yes (comment)   Criminal  Activity/Legal Involvement Pertinent to Current Situation/Hospitalization: No - Comment as needed  Activities of Daily Living      Permission Sought/Granted   Permission granted to share information with : Yes, Verbal Permission Granted  Share Information with NAME: Megan Olsen     Permission granted to share info w Relationship: Niece  Permission granted to share info w Contact Information: [phone]  Emotional Assessment Appearance:: Appears stated age Attitude/Demeanor/Rapport: Engaged Affect (typically observed): Accepting Orientation: : Oriented to Self, Oriented to Place, Oriented to  Time, Oriented to Situation Alcohol / Substance Use: Not Applicable Psych Involvement: No (comment)  Admission diagnosis:  Generalized weakness [R53.1] Minor head injury, initial encounter [S09.90XA] Fall, initial encounter [W19.XXXA] Patient Active Problem List   Diagnosis Date Noted   Fall at home, initial encounter 03/14/2022   Elevated CPK 03/14/2022   Hypomagnesemia 03/14/2022   Generalized weakness 03/13/2022   Encounter for testing for latent tuberculosis 02/16/2022   History of myocardial infarction 01/26/2022   NSTEMI (non-ST elevated myocardial infarction) ([place]) 01/30/2019   B12 deficiency 08/22/2018   Facet arthritis, degenerative, cervical spine 08/22/2018   Tachy-brady syndrome ([place]) 04/27/2018   Cervical spondylosis without myelopathy 03/11/2018   Myofascial pain syndrome 03/11/2018   Cervicalgia 03/11/2018   Insomnia 01/26/2018   High risk medication use 01/26/2018   Macular degeneration of both eyes 01/26/2018   S/P right THA, AA 02/23/2017   A-fib ([place]), s/p  catheter ablation of SVT in 2002 07/06/2014   History of fracture of left hip 07/06/2014   Benign essential HTN 07/06/2014   GERD (gastroesophageal reflux disease) 07/06/2014   Anxiety disorder 07/06/2014   History of CVA (cerebrovascular accident), without residual effects 07/06/2014   Chronic iron  deficiency anemia 01/02/2013   Constipation, intermittent 08/04/2007   Depression 01/11/2007   Allergic rhinitis 01/11/2007   Osteoarthritis 01/11/2007   Osteoporosis 01/11/2007   PCP:  Farrel Conners, MD Pharmacy:   [id number] [address]Phone: [phone]: [phone]    Social Determinants of Health (SDOH) Interventions    Readmission Risk Interventions     No data to display

## 2022-03-16 NOTE — Care Management Important Message (Signed)
Important Message  Patient Details  Name: Megan Olsen MRN: [id number] Date of Birth: [birth year]   Medicare Important Message Given:  Yes     Orbie Pyo 03/16/2022, 2:36 PM

## 2022-03-16 NOTE — Progress Notes (Signed)
Physical Therapy Treatment Patient Details Name: Megan Olsen MRN: [id number] DOB: [birth year] Today's Date: 03/16/2022   History of Present Illness Pt is a 88 y/o female presenting  to the ED secondary to fall. CT negative for acute findings. PMH includes CVA, dementia, and pacemaker.    PT Comments    Pt with improved alertness and interaction with PT session today. A&O to self only. Pt ambulating limited hallway distances with RW and min guard assist. Gait speed of 1.34 ft/s indicative of high fall risk in addition to history of falls and decreased safety awareness. In light of deficits and decreased caregiver support, continue to recommend SNF.     Recommendations for follow up therapy are one component of a multi-disciplinary discharge planning process, led by the attending physician.  Recommendations may be updated based on patient status, additional functional criteria and insurance authorization.  Follow Up Recommendations  Skilled nursing-short term rehab (<3 hours/day) Can patient physically be transported by private vehicle: Yes   Assistance Recommended at Discharge Frequent or constant Supervision/Assistance  Patient can return home with the following Assistance with cooking/housework;Direct supervision/assist for medications management;Direct supervision/assist for financial management;Assist for transportation;Help with stairs or ramp for entrance   Equipment Recommendations  None recommended by PT    Recommendations for Other Services       Precautions / Restrictions Precautions Precautions: Fall Restrictions Weight Bearing Restrictions: No     Mobility  Bed Mobility               General bed mobility comments: Up in chair upon arrival    Transfers Overall transfer level: Needs assistance Equipment used: Rolling walker (2 wheels) Transfers: Sit to/from Stand, Bed to chair/wheelchair/BSC Sit to Stand: Min guard                 Ambulation/Gait Ambulation/Gait assistance: Min guard Gait Distance (Feet): 200 Feet Assistive device: Rolling walker (2 wheels) Gait Pattern/deviations: Step-through pattern, Decreased stride length, Trendelenburg Gait velocity: Decreased     General Gait Details: Increased Trendelenberg with distance, min guard for safety   Stairs             Wheelchair Mobility    Modified Rankin (Stroke Patients Only)       Balance Overall balance assessment: Needs assistance Sitting-balance support: No upper extremity supported Sitting balance-Leahy Scale: Good     Standing balance support: Bilateral upper extremity supported Standing balance-Leahy Scale: Fair Standing balance comment: Reliant on BUE support                            Cognition Arousal/Alertness: Awake/alert Behavior During Therapy: WFL for tasks assessed/performed Overall Cognitive Status: History of cognitive impairments - at baseline                                 General Comments: oriented to self, dementia at baseline, stating it is 2003 vs 2023, cognition WFL for basic ADL/IADL tasks        Exercises      General Comments General comments (skin integrity, edema, etc.): BP 125/59 (78) after transfer/short distance in room ambulation, pt reporting mild dizziness, RN aware      Pertinent Vitals/Pain Pain Assessment Pain Assessment: No/denies pain    Home Living  Prior Function            PT Goals (current goals can now be found in the care plan section) Acute Rehab PT Goals Potential to Achieve Goals: Good Progress towards PT goals: Progressing toward goals    Frequency    Min 2X/week      PT Plan Current plan remains appropriate    Co-evaluation              AM-PAC PT "6 Clicks" Mobility   Outcome Measure  Help needed turning from your back to your side while in a flat bed without using bedrails?:  None Help needed moving from lying on your back to sitting on the side of a flat bed without using bedrails?: A Little Help needed moving to and from a bed to a chair (including a wheelchair)?: A Little Help needed standing up from a chair using your arms (e.g., wheelchair or bedside chair)?: A Little Help needed to walk in hospital room?: A Little Help needed climbing 3-5 steps with a railing? : A Little 6 Click Score: 19    End of Session Equipment Utilized During Treatment: Gait belt Activity Tolerance: Patient tolerated treatment well Patient left: Other (comment) (with OT) Nurse Communication: Mobility status PT Visit Diagnosis: Unsteadiness on feet (R26.81);Muscle weakness (generalized) (M62.81)     Time: 5916-3846 PT Time Calculation (min) (ACUTE ONLY): 11 min  Charges:  $Therapeutic Activity: 8-22 mins                     Wyona Almas, PT, DPT Acute Rehabilitation Services Office [phone]    Deno Etienne 03/16/2022, 5:03 PM

## 2022-03-16 NOTE — Plan of Care (Signed)

## 2022-03-16 NOTE — Progress Notes (Signed)
Occupational Therapy Treatment Patient Details Name: RICK WARNICK MRN: [id number] DOB: November 05, 1927 Today's Date: 03/16/2022   History of present illness Pt is a 88 y/o female presenting  to the ED secondary to fall. CT negative for acute findings. PMH includes CVA, dementia, and pacemaker.   OT comments  Pt progressing towards goals, able to complete seated/standing ADLs and transfers with supervision-minA using RW. Pt reporting mild dizziness after ambulating short distance in room, BP WNL. Pt oriented to self and place with cuing, stating it is 2003. Pt with decreased safety awareness, attempting to mobilize prior to therapist cues. Pt presenting with impairments listed below, will follow acutely. Continue to recommend SNF at d/c.   Recommendations for follow up therapy are one component of a multi-disciplinary discharge planning process, led by the attending physician.  Recommendations may be updated based on patient status, additional functional criteria and insurance authorization.    Follow Up Recommendations  Skilled nursing-short term rehab (<3 hours/day)    Assistance Recommended at Discharge Frequent or constant Supervision/Assistance  Patient can return home with the following  A little help with walking and/or transfers;A lot of help with bathing/dressing/bathroom;Assistance with cooking/housework;Direct supervision/assist for medications management;Direct supervision/assist for financial management;Assist for transportation;Help with stairs or ramp for entrance   Equipment Recommendations  BSC/3in1 (as shower seat)    Recommendations for Other Services PT consult    Precautions / Restrictions Precautions Precautions: Fall Restrictions Weight Bearing Restrictions: No       Mobility Bed Mobility               General bed mobility comments: up with PT upon arrival, left in chair at departure    Transfers Overall transfer level: Needs assistance Equipment  used: Rolling walker (2 wheels) Transfers: Sit to/from Stand, Bed to chair/wheelchair/BSC Sit to Stand: Min guard                 Balance Overall balance assessment: Needs assistance Sitting-balance support: No upper extremity supported Sitting balance-Leahy Scale: Good Sitting balance - Comments: can reach down towards feet without LOB   Standing balance support: Bilateral upper extremity supported Standing balance-Leahy Scale: Fair Standing balance comment: Reliant on BUE support                           ADL either performed or assessed with clinical judgement   ADL Overall ADL's : Needs assistance/impaired Eating/Feeding: Supervision/ safety;Sitting   Grooming: Min guard;Standing Grooming Details (indicate cue type and reason): blowing nose in standing             Lower Body Dressing: Sitting/lateral leans;Minimal assistance Lower Body Dressing Details (indicate cue type and reason): pulling up socks Toilet Transfer: Min guard;Rolling walker (2 wheels);Ambulation;Regular Glass blower/designer Details (indicate cue type and reason): simulated with functional mobility         Functional mobility during ADLs: Min guard;Rolling walker (2 wheels)      Extremity/Trunk Assessment Upper Extremity Assessment Upper Extremity Assessment: Generalized weakness (bruising to RUE elbow, pt denies pain)   Lower Extremity Assessment Lower Extremity Assessment: Defer to PT evaluation        Vision   Vision Assessment?: Vision impaired- to be further tested in functional context Additional Comments: macular degeneration   Perception Perception Perception: Not tested   Praxis Praxis Praxis: Not tested    Cognition Arousal/Alertness: Awake/alert Behavior During Therapy: WFL for tasks assessed/performed Overall Cognitive Status: History of cognitive impairments - at  baseline                                 General Comments: oriented to  self and place, dementia at baseline, stating it is 2003 vs 2023, cognition WFL for basic ADL/IADL tasks        Exercises      Shoulder Instructions       General Comments BP 125/59 (78) after transfer/short distance in room ambulation, pt reporting mild dizziness, RN aware    Pertinent Vitals/ Pain       Pain Assessment Pain Assessment: No/denies pain Faces Pain Scale: Hurts little more Pain Location: generalized Pain Descriptors / Indicators: Grimacing, Guarding Pain Intervention(s): Limited activity within patient's tolerance, Monitored during session, Repositioned  Home Living                                          Prior Functioning/Environment              Frequency  Min 2X/week        Progress Toward Goals  OT Goals(current goals can now be found in the care plan section)  Progress towards OT goals: Progressing toward goals  Acute Rehab OT Goals Patient Stated Goal: to go to rehab OT Goal Formulation: With patient Time For Goal Achievement: 03/28/22 Potential to Achieve Goals: Good ADL Goals Pt Will Perform Upper Body Dressing: with supervision;sitting Pt Will Perform Lower Body Dressing: with min assist;sitting/lateral leans;sit to/from stand Pt Will Transfer to Toilet: with supervision;ambulating;regular height toilet Additional ADL Goal #1: pt will perform bed mobility mod I in prep for ADL  Plan Discharge plan remains appropriate;Frequency remains appropriate    Co-evaluation                 AM-PAC OT "6 Clicks" Daily Activity     Outcome Measure   Help from another person eating meals?: A Little Help from another person taking care of personal grooming?: A Little Help from another person toileting, which includes using toliet, bedpan, or urinal?: A Little Help from another person bathing (including washing, rinsing, drying)?: A Lot Help from another person to put on and taking off regular upper body clothing?: A  Little Help from another person to put on and taking off regular lower body clothing?: A Lot 6 Click Score: 16    End of Session Equipment Utilized During Treatment: Gait belt;Rolling walker (2 wheels)  OT Visit Diagnosis: Unsteadiness on feet (R26.81);Other abnormalities of gait and mobility (R26.89);Muscle weakness (generalized) (M62.81)   Activity Tolerance Patient tolerated treatment well   Patient Left in chair;with call bell/phone within reach;with chair alarm set   Nurse Communication Mobility status        Time: 3212-2482 OT Time Calculation (min): 16 min  Charges: OT General Charges $OT Visit: 1 Visit OT Treatments $Self Care/Home Management : 8-22 mins  Lynnda Child, OTD, OTR/L Acute Rehab [phone] - [place] 03/16/2022, 4:30 PM

## 2022-03-16 NOTE — Progress Notes (Signed)
PROGRESS NOTE        PATIENT DETAILS Name: Megan Olsen Age: 88 y.o. Sex: female Date of Birth: [birth year] Admit Date: 03/13/2022 Admitting Physician Rhetta Mura, DO [id number], Royston Cowper, MD  Brief Summary: Patient is a 88 y.o.  female with history of PAF on Eliquis, sick sinus syndrome-s/p PPM placement, dementia, HTN, chronic anemia-who presented to the ED following a mechanical fall.  She was subsequently admitted for further evaluation and treatment.  Significant events: 8/25>> mechanical fall-admit to [facility].  Significant studies: 8/25>> CXR: No PNA 8/25>> x-ray pelvis: No fracture 8/25>> CT head: No acute intracranial abnormality. 8/25>> CT C-spine: No fracture.  Significant microbiology data: None  Procedures: None  Consults: None  Subjective:  Patient in bed, appears comfortable, denies any headache, no fever, no chest pain or pressure, no shortness of breath , no abdominal pain. No new focal weakness.    Objective: Vitals: Blood pressure 138/79, pulse 64, temperature (!) 97.3 F (36.3 C), temperature source Oral, resp. rate 19, height '5\' 3"'$  (1.6 m), weight 51.7 kg, SpO2 98 %.   Exam:  Awake Alert, No new F.N deficits, old left-sided facial droop [place].AT,PERRAL Supple Neck, No JVD,   Symmetrical Chest wall movement, Good air movement bilaterally, CTAB RRR,No Gallops, Rubs or new Murmurs,  +ve B.Sounds, Abd Soft, No tenderness,   No Cyanosis, small right forearm hematoma,   Assessment/Plan:  Mechanical fall: Denies syncope-imaging negative for fractures-await PT/OT eval. Head CT on admission unremarkable, no focal deficits, old mild left-sided facial droop, able head CT x2. Rhabdomyolysis: Mild-stop IVF-follow periodically.  CK improving.  Hypomagnesemia and hypokalemia.  Replace.  Thrombocytopenia: Mild-stable for outpatient follow-up.  Chronic iron deficiency anemia: Stable for ongoing monitoring in the outpatient  setting.  Appears to be mild.  PAF: Paced rhythm-continue bisoprolol-on Eliquis.  Fall at home with right forearm hematoma.  Check x-ray to rule out underlying fracture.   HTN: BP stable-continue losartan/beta-blocker-continue to hold HCTZ.  Added Norvasc for better control.  Generalized anxiety disorder: Appears stable-continue as needed Ativan.  Dementia: Relatively awake/alert-maintain delirium precautions  Underweight: Estimated body mass index is 20.19 kg/m as calculated from the following:   Height as of this encounter: '5\' 3"'$  (1.6 m).   Weight as of this encounter: 51.7 kg.   Code status:   Code Status: DNR   DVT Prophylaxis: apixaban (ELIQUIS) tablet 2.5 mg Start: 03/14/22 1000 SCDs Start: 03/13/22 2109 apixaban (ELIQUIS) tablet 2.5 mg     Family Communication: Niece-Andrea Cook-[phone]-updated over the phone on 03/15/2022, she also looked at her aunt and does not think her facial asymmetry is new.   Disposition Plan: Status is: Inpatient Remains inpatient appropriate because: Mechanical fall-needs SNF-lives alone-unsafe discharge.  Awaiting SNF bed.   Planned Discharge Destination:Skilled nursing facility   Diet: Diet Order             Diet regular Room service appropriate? Yes; Fluid consistency: Thin  Diet effective now                     Antimicrobial agents: Anti-infectives (From admission, onward)    None        MEDICATIONS: Scheduled Meds:  amLODipine  10 mg Oral Daily   apixaban  2.5 mg Oral BID   bisoprolol  5 mg Oral BID   losartan  25 mg Oral Daily  Continuous Infusions:   PRN Meds:.acetaminophen **OR** acetaminophen, hydrALAZINE, LORazepam   I have personally reviewed following labs and imaging studies  LABORATORY DATA:  Recent Labs  Lab 03/13/22 1130 03/14/22 0613 03/15/22 0328 03/16/22 0336  WBC 7.2 5.4 4.7 4.6  HGB 11.5* 12.1 12.3 12.1  HCT 34.3* 34.7* 35.5* 36.0  PLT 110* 129* 120* 139*  MCV 93.5 90.6  91.0 93.5  MCH 31.3 31.6 31.5 31.4  MCHC 33.5 34.9 34.6 33.6  RDW 13.1 13.2 13.4 13.5  LYMPHSABS 0.3* 0.4* 0.6* 0.6*  MONOABS 0.7 0.4 0.6 0.6  EOSABS 0.1 0.1 0.2 0.2  BASOSABS 0.0 0.0 0.0 0.0    Recent Labs  Lab 03/13/22 1130 03/14/22 0613 03/15/22 0328  NA 135 137 137  K 4.0 3.4* 3.8  CL 101 106 106  CO2 '25 23 23  '$ GLUCOSE 110* 82 89  BUN '13 11 9  '$ CREATININE 0.77 0.77 0.82  CALCIUM 8.7* 8.7* 8.5*  AST 43* 36  --   ALT 30 26  --   ALKPHOS 105 114  --   BILITOT 0.6 1.1  --   ALBUMIN 3.3* 3.3*  --   MG 1.6* 1.6* 1.8  TSH  --  4.439  --     MICROBIOLOGY: No results found for this or any previous visit (from the past 240 hour(s)).  RADIOLOGY STUDIES/RESULTS: CT HEAD WO CONTRAST (5MM)  Result Date: 03/15/2022 CLINICAL DATA:  88 year old female status post fall.  Syncope. EXAM: CT HEAD WITHOUT CONTRAST TECHNIQUE: Contiguous axial images were obtained from the base of the skull through the vertex without intravenous contrast. RADIATION DOSE REDUCTION: This exam was performed according to the departmental dose-optimization program which includes automated exposure control, adjustment of the mA and/or kV according to patient size and/or use of iterative reconstruction technique. COMPARISON:  Head CT 03/13/2022. FINDINGS: Brain: No midline shift, ventriculomegaly, mass effect, evidence of mass lesion, intracranial hemorrhage or evidence of cortically based acute infarction. Patchy bilateral white matter hypodensity appears stable from 2 days ago. Vascular: Calcified atherosclerosis at the skull base. No suspicious intracranial vascular hyperdensity. Skull: Partially visible advanced C1-C2 degeneration on the left. No acute or suspicious osseous lesion identified. Sinuses/Orbits: Partially visible left maxillary alveolar process dental lucency. Associated left maxillary sinus alveolar recess mucosal thickening and/or retention cysts. Other paranasal sinuses, mastoids, tympanic cavities are  essentially clear. Other: No acute orbit or scalp soft tissue finding. IMPRESSION: 1. No acute intracranial abnormality or recent traumatic injury identified. 2. Stable mild to moderate for age cerebral white matter changes, most commonly due to small vessel disease. Electronically Signed   By: Genevie Ann M.D.   On: 03/15/2022 11:24     LOS: 3 days   Signature  Lala Lund M.D on 03/16/2022 at 8:39 AM   -  To page go to [email or web address]

## 2022-03-17 DIAGNOSIS — D509 Iron deficiency anemia, unspecified: Secondary | ICD-10-CM | POA: Diagnosis not present

## 2022-03-17 DIAGNOSIS — F0280 Dementia in other diseases classified elsewhere without behavioral disturbance: Secondary | ICD-10-CM | POA: Diagnosis not present

## 2022-03-17 DIAGNOSIS — E039 Hypothyroidism, unspecified: Secondary | ICD-10-CM | POA: Diagnosis not present

## 2022-03-17 DIAGNOSIS — E441 Mild protein-calorie malnutrition: Secondary | ICD-10-CM | POA: Diagnosis not present

## 2022-03-17 DIAGNOSIS — I10 Essential (primary) hypertension: Secondary | ICD-10-CM | POA: Diagnosis not present

## 2022-03-17 DIAGNOSIS — R531 Weakness: Secondary | ICD-10-CM | POA: Diagnosis not present

## 2022-03-17 DIAGNOSIS — M810 Age-related osteoporosis without current pathological fracture: Secondary | ICD-10-CM | POA: Diagnosis not present

## 2022-03-17 DIAGNOSIS — W19XXXA Unspecified fall, initial encounter: Secondary | ICD-10-CM | POA: Diagnosis not present

## 2022-03-17 DIAGNOSIS — M6259 Muscle wasting and atrophy, not elsewhere classified, multiple sites: Secondary | ICD-10-CM | POA: Diagnosis not present

## 2022-03-17 DIAGNOSIS — D696 Thrombocytopenia, unspecified: Secondary | ICD-10-CM | POA: Diagnosis not present

## 2022-03-17 DIAGNOSIS — I480 Paroxysmal atrial fibrillation: Secondary | ICD-10-CM | POA: Diagnosis not present

## 2022-03-17 DIAGNOSIS — F411 Generalized anxiety disorder: Secondary | ICD-10-CM | POA: Diagnosis not present

## 2022-03-17 DIAGNOSIS — G4700 Insomnia, unspecified: Secondary | ICD-10-CM | POA: Diagnosis not present

## 2022-03-17 DIAGNOSIS — M6282 Rhabdomyolysis: Secondary | ICD-10-CM | POA: Diagnosis not present

## 2022-03-17 DIAGNOSIS — I495 Sick sinus syndrome: Secondary | ICD-10-CM | POA: Diagnosis not present

## 2022-03-17 DIAGNOSIS — E1165 Type 2 diabetes mellitus with hyperglycemia: Secondary | ICD-10-CM | POA: Diagnosis not present

## 2022-03-17 LAB — CBC WITH DIFFERENTIAL/PLATELET
Abs Immature Granulocytes: 0.02 10*3/uL (ref 0.00–0.07)
Basophils Absolute: 0 10*3/uL (ref 0.0–0.1)
Basophils Relative: 1 %
Eosinophils Absolute: 0.2 10*3/uL (ref 0.0–0.5)
Eosinophils Relative: 4 %
HCT: 36.1 % (ref 36.0–46.0)
Hemoglobin: 12.4 g/dL (ref 12.0–15.0)
Immature Granulocytes: 0 %
Lymphocytes Relative: 12 %
Lymphs Abs: 0.7 10*3/uL (ref 0.7–4.0)
MCH: 31.7 pg (ref 26.0–34.0)
MCHC: 34.3 g/dL (ref 30.0–36.0)
MCV: 92.3 fL (ref 80.0–100.0)
Monocytes Absolute: 0.6 10*3/uL (ref 0.1–1.0)
Monocytes Relative: 12 %
Neutro Abs: 3.8 10*3/uL (ref 1.7–7.7)
Neutrophils Relative %: 71 %
Platelets: 156 10*3/uL (ref 150–400)
RBC: 3.91 MIL/uL (ref 3.87–5.11)
RDW: 13.4 % (ref 11.5–15.5)
WBC: 5.4 10*3/uL (ref 4.0–10.5)
nRBC: 0 % (ref 0.0–0.2)

## 2022-03-17 MED ORDER — LORAZEPAM 0.5 MG PO TABS: 0.5000 mg | ORAL_TABLET | Freq: Every evening | ORAL | 0 refills | Status: DC | PRN

## 2022-03-17 MED ORDER — HYDRALAZINE HCL 50 MG PO TABS
50.0000 mg | ORAL_TABLET | Freq: Three times a day (TID) | ORAL | Status: DC
  Administered 2022-03-17: 50 mg via ORAL
  Filled 2022-03-17: qty 1

## 2022-03-17 MED ORDER — BISOPROLOL FUMARATE 5 MG PO TABS: 5.0000 mg | ORAL_TABLET | Freq: Two times a day (BID) | ORAL | Status: DC

## 2022-03-17 MED ORDER — AMLODIPINE BESYLATE 10 MG PO TABS: 10.0000 mg | ORAL_TABLET | Freq: Every day | ORAL | Status: DC

## 2022-03-17 MED ORDER — HYDRALAZINE HCL 50 MG PO TABS: 50.0000 mg | ORAL_TABLET | Freq: Three times a day (TID) | ORAL | Status: DC

## 2022-03-17 NOTE — Discharge Summary (Signed)
Megan Olsen GYI:[id number] DOB: 09-28-1927 DOA: 03/13/2022  PCP: Farrel Conners, MD  Admit date: 03/13/2022  Discharge date: 03/17/2022  Admitted From: Home   Disposition:  SNF   Recommendations for Outpatient Follow-up:   Follow up with PCP in 1-2 weeks  PCP Please obtain BMP/CBC, 2 view CXR in 1week,  (see Discharge instructions)   PCP Please follow up on the following pending results: =   Home Health: None   Equipment/Devices: None  Consultations: None Discharge Condition: Stable   CODE STATUS: Full   Diet Recommendation: Heart Healthy      Chief Complaint  Patient presents with   Fall    On Thinners     Brief history of present illness from the day of admission and additional interim summary     88 y.o.  female with history of PAF on Eliquis, sick sinus syndrome-s/p PPM placement, dementia, HTN, chronic anemia-who presented to the ED following a mechanical fall.  She was subsequently admitted for further evaluation and treatment.   Significant events: 8/25>> mechanical fall-admit to [facility].   Significant studies: 8/25>> CXR: No PNA 8/25>> x-ray pelvis: No fracture 8/25>> CT head: No acute intracranial abnormality. 8/25>> CT C-spine: No fracture.   Significant microbiology data: None   Procedures: None   Consults: None                                                                 Hospital Course     Mechanical fall: Denies syncope-imaging negative for fractures-await PT/OT eval. Head CT on admission unremarkable, no focal deficits, old mild left-sided facial droop, nonacute head CT x2.  Continue supportive care, PT OT.  SNF.  Rhabdomyolysis: Mild-stop IVF-follow periodically.  CK improving.  Recheck CBC, CMP and CK level in 7 to 10 days at [facility].   Hypomagnesemia and hypokalemia.   Replaced.   Thrombocytopenia: Mild-stable for outpatient follow-up.   Chronic iron deficiency anemia: Stable for ongoing monitoring in the outpatient setting.  Appears to be mild.   PAF: Paced rhythm-continue bisoprolol-on Eliquis.   Fall at home with right forearm hematoma.  Well x-ray without any fracture.   HTN: BP stable-continue losartan/beta-blocker-continue to hold HCTZ.  Added Norvasc for better control.  Monitor and adjust at SNF.   Generalized anxiety disorder: Appears stable-continue as needed Ativan.   Dementia: Relatively awake/alert no acute issues, at risk for delirium.  Mild protein calorie malnutrition.  Protein supplement at SNF per their protocol.   Discharge diagnosis     Principal Problem:   Generalized weakness Active Problems:   Chronic iron deficiency anemia   A-fib ([place]), s/p catheter ablation of SVT in 2002   Anxiety disorder   Fall at home, initial encounter   Elevated CPK   Hypomagnesemia    Discharge instructions  Discharge Instructions     Diet - low sodium heart healthy   Complete by: As directed    Discharge instructions   Complete by: As directed    Follow with Primary MD Farrel Conners, MD in 7 days   Get CBC, CMP, Magnesium, 2 view Chest X ray -  checked next visit within 1 week by  SNF MD   Activity: As tolerated with Full fall precautions use walker/cane & assistance as needed  Disposition SNF  Diet: Heart Healthy with feeding assistance and aspiration precautions.  Special Instructions: If you have smoked or chewed Tobacco  in the last 2 yrs please stop smoking, stop any regular Alcohol  and or any Recreational drug use.  On your next visit with your primary care physician please Get Medicines reviewed and adjusted.  Please request your Prim.MD to go over all Hospital Tests and Procedure/Radiological results at the follow up, please get all Hospital records sent to your Prim MD by signing hospital release before you  go home.  If you experience worsening of your admission symptoms, develop shortness of breath, life threatening emergency, suicidal or homicidal thoughts you must seek medical attention immediately by calling 911 or calling your MD immediately  if symptoms less severe.  You Must read complete instructions/literature along with all the possible adverse reactions/side effects for all the Medicines you take and that have been prescribed to you. Take any new Medicines after you have completely understood and accpet all the possible adverse reactions/side effects.   Increase activity slowly   Complete by: As directed        Discharge Medications   Allergies as of 03/17/2022       Reactions   Propoxyphene N-acetaminophen Nausea Only   Celecoxib Itching, Rash   Fexofenadine Rash   Penicillins Hives, Rash   Has patient had a PCN reaction causing immediate rash, facial/tongue/throat swelling, SOB or lightheadedness with hypotension: Yes Has patient had a PCN reaction causing severe rash involving mucus membranes or skin necrosis: No Has patient had a PCN reaction that required hospitalization: No Has patient had a PCN reaction occurring within the last 10 years: No If all of the above answers are "NO", then may proceed with Cephalosporin use.        Medication List     STOP taking these medications    bisoprolol-hydrochlorothiazide 5-6.25 MG tablet Commonly known as: ZIAC       TAKE these medications    acetaminophen 650 MG CR tablet Commonly known as: TYLENOL Take 650 mg by mouth every 8 (eight) hours as needed for pain.   amLODipine 10 MG tablet Commonly known as: NORVASC Take 1 tablet (10 mg total) by mouth daily.   bisoprolol 5 MG tablet Commonly known as: ZEBETA Take 1 tablet (5 mg total) by mouth 2 (two) times daily.   dexlansoprazole 60 MG capsule Commonly known as: DEXILANT TAKE 1 CAPSULE EVERY DAY   Eliquis 2.5 MG Tabs tablet Generic drug: apixaban TAKE 1  TABLET TWICE DAILY   fluticasone 50 MCG/ACT nasal spray Commonly known as: FLONASE PLACE 2 SPRAYS INTO BOTH NOSTRILS DAILY. What changed:  when to take this reasons to take this   hydrALAZINE 50 MG tablet Commonly known as: APRESOLINE Take 1 tablet (50 mg total) by mouth every 8 (eight) hours.   LORazepam 0.5 MG tablet Commonly known as: ATIVAN Take 1 tablet (0.5 mg total) by mouth at bedtime as needed for anxiety or sleep. What changed:  medication  strength how much to take how to take this when to take this reasons to take this additional instructions   losartan 25 MG tablet Commonly known as: COZAAR Take 1 tablet (25 mg total) by mouth daily.   nitroGLYCERIN 0.4 MG SL tablet Commonly known as: NITROSTAT Place 1 tablet (0.4 mg total) under the tongue every 5 (five) minutes x 3 doses as needed for chest pain.   NON FORMULARY Place 1 Dose into both eyes See admin instructions. Receives injections into both eyes every 5-8 weeks at Dr. Serita Grit office [facility])   PreserVision AREDS 2 Caps Take 1 capsule by mouth 2 (two) times daily.   traZODone 100 MG tablet Commonly known as: DESYREL Take 1 tablet (100 mg total) by mouth at bedtime.         Contact information for after-discharge care     Destination     [facility]erred SNF .   Service: Skilled Nursing Contact information: [place] [phone]                     Major procedures and Radiology Reports - PLEASE review detailed and final reports thoroughly  -       DG Forearm Right  Result Date: 03/16/2022 CLINICAL DATA:  Fall, bruising EXAM: RIGHT FOREARM - 2 VIEW COMPARISON:  None Available. FINDINGS: There is no evidence of fracture or other focal bone lesions. Soft tissue contusion over the dorsum of the mid forearm. Vascular calcinosis. IMPRESSION: No fracture or dislocation of the right forearm. Soft  tissue contusion over the dorsum of the mid forearm. Electronically Signed   By: Delanna Ahmadi M.D.   On: 03/16/2022 09:58   CT HEAD WO CONTRAST (5MM)  Result Date: 03/15/2022 CLINICAL DATA:  87 year old female status post fall.  Syncope. EXAM: CT HEAD WITHOUT CONTRAST TECHNIQUE: Contiguous axial images were obtained from the base of the skull through the vertex without intravenous contrast. RADIATION DOSE REDUCTION: This exam was performed according to the departmental dose-optimization program which includes automated exposure control, adjustment of the mA and/or kV according to patient size and/or use of iterative reconstruction technique. COMPARISON:  Head CT 03/13/2022. FINDINGS: Brain: No midline shift, ventriculomegaly, mass effect, evidence of mass lesion, intracranial hemorrhage or evidence of cortically based acute infarction. Patchy bilateral white matter hypodensity appears stable from 2 days ago. Vascular: Calcified atherosclerosis at the skull base. No suspicious intracranial vascular hyperdensity. Skull: Partially visible advanced C1-C2 degeneration on the left. No acute or suspicious osseous lesion identified. Sinuses/Orbits: Partially visible left maxillary alveolar process dental lucency. Associated left maxillary sinus alveolar recess mucosal thickening and/or retention cysts. Other paranasal sinuses, mastoids, tympanic cavities are essentially clear. Other: No acute orbit or scalp soft tissue finding. IMPRESSION: 1. No acute intracranial abnormality or recent traumatic injury identified. 2. Stable mild to moderate for age cerebral white matter changes, most commonly due to small vessel disease. Electronically Signed   By: Genevie Ann M.D.   On: 03/15/2022 11:24   CT HEAD WO CONTRAST (5MM)  Result Date: 03/13/2022 CLINICAL DATA:  Head trauma, minor (Age >= 65y); Neck trauma (Age >= 65y) EXAM: CT HEAD WITHOUT CONTRAST CT CERVICAL SPINE WITHOUT CONTRAST TECHNIQUE: Multidetector CT imaging of the  head and cervical spine was performed following the standard protocol without intravenous contrast. Multiplanar CT image reconstructions of the cervical spine were also generated. RADIATION DOSE REDUCTION: This exam was performed according to the departmental dose-optimization program which  includes automated exposure control, adjustment of the mA and/or kV according to patient size and/or use of iterative reconstruction technique. COMPARISON:  CTA head/neck 01/30/2019. FINDINGS: CT HEAD FINDINGS Brain: No evidence of acute infarction, hemorrhage, hydrocephalus, extra-axial collection or mass lesion/mass effect. Patchy white matter hypodensities, nonspecific but compatible with chronic microvascular ischemic disease. Vascular: No hyperdense vessel identified. Calcific intracranial atherosclerosis. Skull: No acute fracture. Sinuses/Orbits: Periapical lucency of a left maxillary molar with inferior left maxillary sinus mucosal thickening, possibly odontogenic. No acute orbital findings. Other: No mastoid effusions. CT CERVICAL SPINE FINDINGS Alignment: Similar anterolisthesis of C4 on C5. No new sagittal subluxation. Levocurvature. Skull base and vertebrae: Vertebral body heights are maintained. No evidence of acute fracture. Soft tissues and spinal canal: No prevertebral fluid or swelling. No visible canal hematoma. Disc levels: Severe multilevel degenerative change including severe degenerative disease at is greatest at C6-C7, multilevel ankylosis and facet arthropathy and craniocervical degenerative change. Upper chest: Biapical pleuroparenchymal scarring. Otherwise, visualized lung apices are clear. IMPRESSION: 1. No evidence of acute intracranial abnormality. 2. No evidence of acute fracture or traumatic malalignment in the cervical spine. Electronically Signed   By: Margaretha Sheffield M.D.   On: 03/13/2022 13:44   CT Cervical Spine Wo Contrast  Result Date: 03/13/2022 CLINICAL DATA:  Head trauma, minor (Age  >= 65y); Neck trauma (Age >= 65y) EXAM: CT HEAD WITHOUT CONTRAST CT CERVICAL SPINE WITHOUT CONTRAST TECHNIQUE: Multidetector CT imaging of the head and cervical spine was performed following the standard protocol without intravenous contrast. Multiplanar CT image reconstructions of the cervical spine were also generated. RADIATION DOSE REDUCTION: This exam was performed according to the departmental dose-optimization program which includes automated exposure control, adjustment of the mA and/or kV according to patient size and/or use of iterative reconstruction technique. COMPARISON:  CTA head/neck 01/30/2019. FINDINGS: CT HEAD FINDINGS Brain: No evidence of acute infarction, hemorrhage, hydrocephalus, extra-axial collection or mass lesion/mass effect. Patchy white matter hypodensities, nonspecific but compatible with chronic microvascular ischemic disease. Vascular: No hyperdense vessel identified. Calcific intracranial atherosclerosis. Skull: No acute fracture. Sinuses/Orbits: Periapical lucency of a left maxillary molar with inferior left maxillary sinus mucosal thickening, possibly odontogenic. No acute orbital findings. Other: No mastoid effusions. CT CERVICAL SPINE FINDINGS Alignment: Similar anterolisthesis of C4 on C5. No new sagittal subluxation. Levocurvature. Skull base and vertebrae: Vertebral body heights are maintained. No evidence of acute fracture. Soft tissues and spinal canal: No prevertebral fluid or swelling. No visible canal hematoma. Disc levels: Severe multilevel degenerative change including severe degenerative disease at is greatest at C6-C7, multilevel ankylosis and facet arthropathy and craniocervical degenerative change. Upper chest: Biapical pleuroparenchymal scarring. Otherwise, visualized lung apices are clear. IMPRESSION: 1. No evidence of acute intracranial abnormality. 2. No evidence of acute fracture or traumatic malalignment in the cervical spine. Electronically Signed   By:  Margaretha Sheffield M.D.   On: 03/13/2022 13:44   DG Pelvis Portable  Result Date: 03/13/2022 CLINICAL DATA:  Trauma, fall EXAM: PORTABLE PELVIS 1-2 VIEWS COMPARISON:  None Available. FINDINGS: There is previous right hip arthroplasty. There is previous internal fixation in the neck of left femur with surgical screws. No recent displaced fracture is seen. Extensive arterial calcifications are seen. Degenerative changes are noted in lower lumbar spine. IMPRESSION: No displaced fracture is seen in pelvis. Other findings as described in the body of the report. Electronically Signed   By: Elmer Picker M.D.   On: 03/13/2022 12:41   DG Chest Portable 1 View  Result Date: 03/13/2022 CLINICAL  DATA:  Trauma, fall EXAM: PORTABLE CHEST 1 VIEW COMPARISON:  02/08/2019 FINDINGS: Transverse diameter of heart is increased. Pacemaker battery is seen in the left infraclavicular region. There are no signs of pulmonary edema or focal pulmonary consolidation. There is no pleural effusion or pneumothorax. IMPRESSION: Cardiomegaly. There are no signs of pulmonary edema or focal pulmonary consolidation. There is no pleural effusion or pneumothorax. Electronically Signed   By: Elmer Picker M.D.   On: 03/13/2022 12:40      Today   Subjective    Megan Olsen today has no headache,no chest abdominal pain,no new weakness tingling or numbness, feels much better wants to go home today.     Objective   Blood pressure (!) 141/76, pulse 66, temperature 97.9 F (36.6 C), temperature source Oral, resp. rate 17, height '5\' 3"'$  (1.6 m), weight 51.7 kg, SpO2 93 %.   Intake/Output Summary (Last 24 hours) at 03/17/2022 0908 Last data filed at 03/16/2022 2130 Gross per 24 hour  Intake 120 ml  Output --  Net 120 ml    Exam  Awake Alert, No new F.N deficits, old left-sided facial droop .AT,PERRAL Supple Neck, No JVD,   Symmetrical Chest wall movement, Good air movement bilaterally, CTAB RRR,No Gallops, Rubs or  new Murmurs,  +ve B.Sounds, Abd Soft, No tenderness,   No Cyanosis, small right forearm hematoma,    Data Review   Recent Labs  Lab 03/13/22 1130 03/14/22 0613 03/15/22 0328 03/16/22 0336 03/17/22 0314  WBC 7.2 5.4 4.7 4.6 5.4  HGB 11.5* 12.1 12.3 12.1 12.4  HCT 34.3* 34.7* 35.5* 36.0 36.1  PLT 110* 129* 120* 139* 156  MCV 93.5 90.6 91.0 93.5 92.3  MCH 31.3 31.6 31.5 31.4 31.7  MCHC 33.5 34.9 34.6 33.6 34.3  RDW 13.1 13.2 13.4 13.5 13.4  LYMPHSABS 0.3* 0.4* 0.6* 0.6* 0.7  MONOABS 0.7 0.4 0.6 0.6 0.6  EOSABS 0.1 0.1 0.2 0.2 0.2  BASOSABS 0.0 0.0 0.0 0.0 0.0    Recent Labs  Lab 03/13/22 1130 03/14/22 0613 03/15/22 0328  NA 135 137 137  K 4.0 3.4* 3.8  CL 101 106 106  CO2 '25 23 23  '$ GLUCOSE 110* 82 89  BUN '13 11 9  '$ CREATININE 0.77 0.77 0.82  CALCIUM 8.7* 8.7* 8.5*  AST 43* 36  --   ALT 30 26  --   ALKPHOS 105 114  --   BILITOT 0.6 1.1  --   ALBUMIN 3.3* 3.3*  --   MG 1.6* 1.6* 1.8  TSH  --  4.439  --     Total Time in preparing paper work, data evaluation and todays exam - 61 minutes  Lala Lund M.D on 03/17/2022 at 9:08 AM  [facility]

## 2022-03-17 NOTE — Final Progress Note (Signed)
Attempted to call [facility] with no answer. D/c packet given to niece, who is taking patient to facility.

## 2022-03-17 NOTE — TOC CAGE-AID Note (Signed)
Transition of Care [facility]) - CAGE-AID Screening   Patient Details  Name: Megan Olsen MRN: [id number] Date of Birth: 1928-04-23  [facility] CM/SW Contact:    Army Melia, RN Phone Number:[phone] 03/17/2022, 5:10 AM       CAGE-AID Screening: Substance Abuse Screening unable to be completed due to: : Patient unable to participate (hx dementia)

## 2022-03-17 NOTE — Discharge Instructions (Addendum)
Follow with Primary MD Farrel Conners, MD in 7 days   Get CBC, CMP, Magnesium, 2 view Chest X ray -  checked next visit within 1 week by  SNF MD   Activity: As tolerated with Full fall precautions use walker/cane & assistance as needed  Disposition SNF  Diet: Heart Healthy with feeding assistance and aspiration precautions.  Special Instructions: If you have smoked or chewed Tobacco  in the last 2 yrs please stop smoking, stop any regular Alcohol  and or any Recreational drug use.  On your next visit with your primary care physician please Get Medicines reviewed and adjusted.  Please request your Prim.MD to go over all Hospital Tests and Procedure/Radiological results at the follow up, please get all Hospital records sent to your Prim MD by signing hospital release before you go home.  If you experience worsening of your admission symptoms, develop shortness of breath, life threatening emergency, suicidal or homicidal thoughts you must seek medical attention immediately by calling 911 or calling your MD immediately  if symptoms less severe.  You Must read complete instructions/literature along with all the possible adverse reactions/side effects for all the Medicines you take and that have been prescribed to you. Take any new Medicines after you have completely understood and accpet all the possible adverse reactions/side effects.   =================================================================================================================  Information on my medicine - ELIQUIS (apixaban)  Why was Eliquis prescribed for you? Eliquis was prescribed for you to reduce the risk of a blood clot forming that can cause a stroke if you have a medical condition called atrial fibrillation (a type of irregular heartbeat).  What do You need to know about Eliquis ? Take your Eliquis TWICE DAILY - one tablet in the morning and one tablet in the evening with or without food. If you have  difficulty swallowing the tablet whole please discuss with your pharmacist how to take the medication safely.  Take Eliquis exactly as prescribed by your doctor and DO NOT stop taking Eliquis without talking to the doctor who prescribed the medication.  Stopping may increase your risk of developing a stroke.  Refill your prescription before you run out.  After discharge, you should have regular check-up appointments with your healthcare provider that is prescribing your Eliquis.  In the future your dose may need to be changed if your kidney function or weight changes by a significant amount or as you get older.  What do you do if you miss a dose? If you miss a dose, take it as soon as you remember on the same day and resume taking twice daily.  Do not take more than one dose of ELIQUIS at the same time to make up a missed dose.  Important Safety Information A possible side effect of Eliquis is bleeding. You should call your healthcare provider right away if you experience any of the following: Bleeding from an injury or your nose that does not stop. Unusual colored urine (red or dark brown) or unusual colored stools (red or black). Unusual bruising for unknown reasons. A serious fall or if you hit your head (even if there is no bleeding).  Some medicines may interact with Eliquis and might increase your risk of bleeding or clotting while on Eliquis. To help avoid this, consult your healthcare provider or pharmacist prior to using any new prescription or non-prescription medications, including herbals, vitamins, non-steroidal anti-inflammatory drugs (NSAIDs) and supplements.  This website has more information on Eliquis (apixaban): http://www.eliquis.com/eliquis/home

## 2022-03-17 NOTE — TOC Transition Note (Signed)
Transition of Care [facility]) - CM/SW Discharge Note   Patient Details  Name: Megan Olsen MRN: [id number] Date of Birth: [birth year]  Transition of Care [facility]) CM/SW Contact:  Benard Halsted, LCSW Phone Number: 03/17/2022, 11:08 AM   Clinical Narrative:    Patient will DC to: [facility] Anticipated DC date: 03/17/22 Family notified: Niece, Seth Bake Transport by: Seth Bake by car   Per MD patient ready for DC to [facility]. RN to call report prior to discharge [phone] room 3250). RN, patient, patient's family, and facility notified of DC. Discharge Summary and FL2 sent to facility. Signed script on chart.   CSW will sign off for now as social work intervention is no longer needed. Please consult [place] again if new needs arise.     Final next level of care: Skilled Nursing Facility Barriers to Discharge: Barriers Resolved   Patient Goals and CMS Choice Patient states their goals for this hospitalization and ongoing recovery are:: Transition to assisted living after SNF CMS Medicare.gov Compare Post Acute Care list provided to:: Patient Represenative (must comment) Choice offered to / list presented to : Patient, [facility] POA / Guardian  Discharge Placement   Existing PASRR number confirmed : 03/17/22          Patient chooses bed at: [facility] Patient to be transferred to facility by: Niece Name of family member notified: Seth Bake Patient and family notified of of transfer: 03/17/22  Discharge Plan and Services In-house Referral: Clinical Social Work   Post Acute Care Choice: [place]                               Social Determinants of Health (SDOH) Interventions     Readmission Risk Interventions     No data to display

## 2022-03-17 NOTE — TOC Progression Note (Addendum)
Transition of Care [facility]) - Progression Note    Patient Details  Name: ANETT RANKER MRN: [id number] Date of Birth: [birth year]  Transition of Care [facility]) CM/SW [place], LCSW Phone Number: 03/17/2022, 9:01 AM  Clinical Narrative:    CSW received insurance approval for [facility], Ref#  [id number], Auth ID# [id number], effective 03/17/2022-03/19/2022.  CSW updated patient's niece, Seth Bake. She is completing paperwork at [facility] at 10am and will be at the hospital around 1pm to pick patient up.    Expected Discharge Plan: [place] Barriers to Discharge: Barriers Resolved  Expected Discharge Plan and Services Expected Discharge Plan: [place] In-house Referral: Clinical Social Work   Post Acute Care Choice: [place] Living arrangements for the past 2 months: Single Family Home                                       Social Determinants of Health (SDOH) Interventions    Readmission Risk Interventions     No data to display

## 2022-03-18 DIAGNOSIS — E1165 Type 2 diabetes mellitus with hyperglycemia: Secondary | ICD-10-CM | POA: Diagnosis not present

## 2022-03-18 DIAGNOSIS — W19XXXA Unspecified fall, initial encounter: Secondary | ICD-10-CM | POA: Diagnosis not present

## 2022-03-18 DIAGNOSIS — M6282 Rhabdomyolysis: Secondary | ICD-10-CM | POA: Diagnosis not present

## 2022-03-18 DIAGNOSIS — I480 Paroxysmal atrial fibrillation: Secondary | ICD-10-CM | POA: Diagnosis not present

## 2022-03-19 DIAGNOSIS — I495 Sick sinus syndrome: Secondary | ICD-10-CM | POA: Diagnosis not present

## 2022-03-19 DIAGNOSIS — G4700 Insomnia, unspecified: Secondary | ICD-10-CM | POA: Diagnosis not present

## 2022-03-19 DIAGNOSIS — D509 Iron deficiency anemia, unspecified: Secondary | ICD-10-CM | POA: Diagnosis not present

## 2022-03-19 DIAGNOSIS — F411 Generalized anxiety disorder: Secondary | ICD-10-CM | POA: Diagnosis not present

## 2022-03-19 DIAGNOSIS — E039 Hypothyroidism, unspecified: Secondary | ICD-10-CM | POA: Diagnosis not present

## 2022-03-19 DIAGNOSIS — I480 Paroxysmal atrial fibrillation: Secondary | ICD-10-CM | POA: Diagnosis not present

## 2022-03-19 DIAGNOSIS — I10 Essential (primary) hypertension: Secondary | ICD-10-CM | POA: Diagnosis not present

## 2022-03-19 DIAGNOSIS — E441 Mild protein-calorie malnutrition: Secondary | ICD-10-CM | POA: Diagnosis not present

## 2022-03-19 DIAGNOSIS — F0280 Dementia in other diseases classified elsewhere without behavioral disturbance: Secondary | ICD-10-CM | POA: Diagnosis not present

## 2022-03-24 DIAGNOSIS — I10 Essential (primary) hypertension: Secondary | ICD-10-CM | POA: Diagnosis not present

## 2022-03-24 DIAGNOSIS — F411 Generalized anxiety disorder: Secondary | ICD-10-CM | POA: Diagnosis not present

## 2022-03-24 DIAGNOSIS — E039 Hypothyroidism, unspecified: Secondary | ICD-10-CM | POA: Diagnosis not present

## 2022-03-24 DIAGNOSIS — D696 Thrombocytopenia, unspecified: Secondary | ICD-10-CM | POA: Diagnosis not present

## 2022-03-24 DIAGNOSIS — I480 Paroxysmal atrial fibrillation: Secondary | ICD-10-CM | POA: Diagnosis not present

## 2022-03-24 DIAGNOSIS — F0280 Dementia in other diseases classified elsewhere without behavioral disturbance: Secondary | ICD-10-CM | POA: Diagnosis not present

## 2022-04-01 DIAGNOSIS — H353221 Exudative age-related macular degeneration, left eye, with active choroidal neovascularization: Secondary | ICD-10-CM | POA: Diagnosis not present

## 2022-04-02 DIAGNOSIS — I10 Essential (primary) hypertension: Secondary | ICD-10-CM | POA: Diagnosis not present

## 2022-04-02 DIAGNOSIS — K219 Gastro-esophageal reflux disease without esophagitis: Secondary | ICD-10-CM | POA: Diagnosis not present

## 2022-04-02 DIAGNOSIS — F5101 Primary insomnia: Secondary | ICD-10-CM | POA: Diagnosis not present

## 2022-04-02 DIAGNOSIS — H3530 Unspecified macular degeneration: Secondary | ICD-10-CM | POA: Diagnosis not present

## 2022-04-02 DIAGNOSIS — I482 Chronic atrial fibrillation, unspecified: Secondary | ICD-10-CM | POA: Diagnosis not present

## 2022-04-08 DIAGNOSIS — H353211 Exudative age-related macular degeneration, right eye, with active choroidal neovascularization: Secondary | ICD-10-CM | POA: Diagnosis not present

## 2022-04-09 DIAGNOSIS — D519 Vitamin B12 deficiency anemia, unspecified: Secondary | ICD-10-CM | POA: Diagnosis not present

## 2022-04-09 DIAGNOSIS — E039 Hypothyroidism, unspecified: Secondary | ICD-10-CM | POA: Diagnosis not present

## 2022-04-09 DIAGNOSIS — Z79899 Other long term (current) drug therapy: Secondary | ICD-10-CM | POA: Diagnosis not present

## 2022-04-09 DIAGNOSIS — E559 Vitamin D deficiency, unspecified: Secondary | ICD-10-CM | POA: Diagnosis not present

## 2022-04-29 ENCOUNTER — Ambulatory Visit (INDEPENDENT_AMBULATORY_CARE_PROVIDER_SITE_OTHER): Payer: Medicare HMO

## 2022-04-29 DIAGNOSIS — I495 Sick sinus syndrome: Secondary | ICD-10-CM

## 2022-04-29 LAB — CUP PACEART REMOTE DEVICE CHECK
Battery Remaining Longevity: 89 mo
Battery Remaining Percentage: 73 %
Battery Voltage: 3.01 V
Brady Statistic RV Percent Paced: 93 %
Date Time Interrogation Session: 20231011020014
Implantable Lead Implant Date: 20191009
Implantable Lead Location: 753860
Implantable Lead Model: 1948
Implantable Pulse Generator Implant Date: 20191009
Lead Channel Impedance Value: 640 Ohm
Lead Channel Pacing Threshold Amplitude: 0.5 V
Lead Channel Pacing Threshold Pulse Width: 0.5 ms
Lead Channel Sensing Intrinsic Amplitude: 10.2 mV
Lead Channel Setting Pacing Amplitude: 2.5 V
Lead Channel Setting Pacing Pulse Width: 0.5 ms
Lead Channel Setting Sensing Sensitivity: 2 mV
Pulse Gen Model: 1272
Pulse Gen Serial Number: 9067558

## 2022-05-05 DIAGNOSIS — F419 Anxiety disorder, unspecified: Secondary | ICD-10-CM | POA: Diagnosis not present

## 2022-05-05 DIAGNOSIS — M810 Age-related osteoporosis without current pathological fracture: Secondary | ICD-10-CM | POA: Diagnosis not present

## 2022-05-06 DIAGNOSIS — I10 Essential (primary) hypertension: Secondary | ICD-10-CM | POA: Diagnosis not present

## 2022-05-12 NOTE — Progress Notes (Signed)
Remote pacemaker transmission.   

## 2022-06-08 ENCOUNTER — Other Ambulatory Visit: Payer: Self-pay

## 2022-06-08 ENCOUNTER — Encounter (HOSPITAL_COMMUNITY): Payer: Self-pay | Admitting: Emergency Medicine

## 2022-06-08 ENCOUNTER — Emergency Department (HOSPITAL_COMMUNITY): Payer: Medicare HMO

## 2022-06-08 ENCOUNTER — Emergency Department (HOSPITAL_COMMUNITY)
Admission: EM | Admit: 2022-06-08 | Discharge: 2022-06-08 | Disposition: A | Discharge status: Home / Self Care | Payer: Medicare HMO | Attending: Emergency Medicine | Admitting: Emergency Medicine

## 2022-06-08 DIAGNOSIS — R0789 Other chest pain: Secondary | ICD-10-CM | POA: Insufficient documentation

## 2022-06-08 DIAGNOSIS — I10 Essential (primary) hypertension: Secondary | ICD-10-CM | POA: Insufficient documentation

## 2022-06-08 DIAGNOSIS — R5383 Other fatigue: Secondary | ICD-10-CM | POA: Diagnosis not present

## 2022-06-08 DIAGNOSIS — I959 Hypotension, unspecified: Secondary | ICD-10-CM | POA: Diagnosis not present

## 2022-06-08 DIAGNOSIS — Z7401 Bed confinement status: Secondary | ICD-10-CM | POA: Diagnosis not present

## 2022-06-08 DIAGNOSIS — E039 Hypothyroidism, unspecified: Secondary | ICD-10-CM | POA: Diagnosis not present

## 2022-06-08 DIAGNOSIS — D649 Anemia, unspecified: Secondary | ICD-10-CM | POA: Diagnosis not present

## 2022-06-08 DIAGNOSIS — Z7901 Long term (current) use of anticoagulants: Secondary | ICD-10-CM | POA: Diagnosis not present

## 2022-06-08 DIAGNOSIS — Z79899 Other long term (current) drug therapy: Secondary | ICD-10-CM | POA: Diagnosis not present

## 2022-06-08 DIAGNOSIS — R079 Chest pain, unspecified: Secondary | ICD-10-CM | POA: Diagnosis not present

## 2022-06-08 DIAGNOSIS — R0689 Other abnormalities of breathing: Secondary | ICD-10-CM | POA: Diagnosis not present

## 2022-06-08 LAB — BASIC METABOLIC PANEL
Anion gap: 9 (ref 5–15)
BUN: 17 mg/dL (ref 8–23)
CO2: 26 mmol/L (ref 22–32)
Calcium: 8.6 mg/dL — ABNORMAL LOW (ref 8.9–10.3)
Chloride: 100 mmol/L (ref 98–111)
Creatinine, Ser: 0.74 mg/dL (ref 0.44–1.00)
GFR, Estimated: >60 mL/min (ref >60)
Glucose, Bld: 99 mg/dL (ref 70–99)
Potassium: 3.9 mmol/L (ref 3.5–5.1)
Sodium: 135 mmol/L (ref 135–145)

## 2022-06-08 LAB — CBC
HCT: 31.5 % — ABNORMAL LOW (ref 36.0–46.0)
Hemoglobin: 10.8 g/dL — ABNORMAL LOW (ref 12.0–15.0)
MCH: 32.7 pg (ref 26.0–34.0)
MCHC: 34.3 g/dL (ref 30.0–36.0)
MCV: 95.5 fL (ref 80.0–100.0)
Platelets: 128 10*3/uL — ABNORMAL LOW (ref 150–400)
RBC: 3.3 MIL/uL — ABNORMAL LOW (ref 3.87–5.11)
RDW: 13.2 % (ref 11.5–15.5)
WBC: 4 10*3/uL (ref 4.0–10.5)
nRBC: 0 % (ref 0.0–0.2)

## 2022-06-08 LAB — URINALYSIS, ROUTINE W REFLEX MICROSCOPIC
Bilirubin Urine: NEGATIVE
Glucose, UA: NEGATIVE mg/dL
Hgb urine dipstick: NEGATIVE
Ketones, ur: NEGATIVE mg/dL
Leukocytes,Ua: NEGATIVE
Nitrite: NEGATIVE
Protein, ur: NEGATIVE mg/dL
Specific Gravity, Urine: 1.01 (ref 1.005–1.030)
pH: 6 (ref 5.0–8.0)

## 2022-06-08 LAB — HEPATIC FUNCTION PANEL
ALT: 17 U/L (ref 0–44)
AST: 21 U/L (ref 15–41)
Albumin: 3.3 g/dL — ABNORMAL LOW (ref 3.5–5.0)
Alkaline Phosphatase: 91 U/L (ref 38–126)
Bilirubin, Direct: 0.1 mg/dL (ref 0.0–0.2)
Indirect Bilirubin: 0.6 mg/dL (ref 0.3–0.9)
Total Bilirubin: 0.7 mg/dL (ref 0.3–1.2)
Total Protein: 5.7 g/dL — ABNORMAL LOW (ref 6.5–8.1)

## 2022-06-08 LAB — TROPONIN I (HIGH SENSITIVITY)
Troponin I (High Sensitivity): 8 ng/L (ref <18)
Troponin I (High Sensitivity): 9 ng/L (ref <18)

## 2022-06-08 LAB — LIPASE, BLOOD: Lipase: 37 U/L (ref 11–51)

## 2022-06-08 LAB — POC OCCULT BLOOD, ED: Fecal Occult Bld: NEGATIVE

## 2022-06-08 MED ORDER — ALUM & MAG HYDROXIDE-SIMETH 200-200-20 MG/5ML PO SUSP
30.0000 mL | Freq: Once | ORAL | Status: AC
  Administered 2022-06-08: 30 mL via ORAL
  Filled 2022-06-08: qty 30

## 2022-06-08 NOTE — Discharge Instructions (Addendum)
You will need to get a repeat CBC (blood count) check in 1-2 weeks.
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
## 2022-06-08 NOTE — ED Notes (Signed)
Report given to [facility]. Pt to be transported back to [facility].
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
## 2022-06-08 NOTE — ED Notes (Signed)
Attempted to call report to [facility] for patient report. Phone to facility just rings over and over.

## 2022-06-08 NOTE — ED Notes (Signed)
Attempted to call [facility] on [address] to give report for patient to go back.
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
## 2022-06-17 ENCOUNTER — Telehealth: Payer: Self-pay

## 2022-06-17 NOTE — Telephone Encounter (Signed)
        Patient  visited [facility] on 11/20    Telephone encounter attempt :  1st  A HIPAA compliant voice message was left requesting a return call.  Instructed patient to call back   Megan Olsen [facility], [facility], Care Management  [phone] [address], [place], [place] [address] Phone: [phone] Email: [email or web address]     

## 2022-06-18 ENCOUNTER — Ambulatory Visit: Payer: Medicare HMO | Admitting: Family Medicine

## 2022-06-18 ENCOUNTER — Telehealth: Payer: Self-pay

## 2022-06-18 NOTE — Telephone Encounter (Signed)
         Patient  visited [facility] on 11/20    Telephone encounter attempt :  2nd  A HIPAA compliant voice message was left requesting a return call.  Instructed patient to call back     [place], [place] Management  [phone] [address], [place], [place] [address] Phone: [phone] Email: [email or web address]

## 2022-06-24 DIAGNOSIS — H353221 Exudative age-related macular degeneration, left eye, with active choroidal neovascularization: Secondary | ICD-10-CM | POA: Diagnosis not present

## 2022-06-29 DIAGNOSIS — F419 Anxiety disorder, unspecified: Secondary | ICD-10-CM | POA: Diagnosis not present

## 2022-06-29 DIAGNOSIS — J069 Acute upper respiratory infection, unspecified: Secondary | ICD-10-CM | POA: Diagnosis not present

## 2022-06-29 DIAGNOSIS — M810 Age-related osteoporosis without current pathological fracture: Secondary | ICD-10-CM | POA: Diagnosis not present

## 2022-07-08 ENCOUNTER — Telehealth: Payer: Self-pay | Admitting: Family Medicine

## 2022-07-08 NOTE — Telephone Encounter (Signed)
Pt called to say she is in an assisted living called [facility] on [address] and she is saying she needs written permission from MD to have a cocktail.  Please advise.  LOV:  02/16/22 = [facility] [address] [place], [place] [address]   [phone]

## 2022-07-08 NOTE — Telephone Encounter (Signed)
Spoke with Caryl Asp, the nurse with [facility], informed her of the message below and she stated she will inform the patient.

## 2022-07-08 NOTE — Telephone Encounter (Signed)
Patient has ativan listed on her medcation list-- it is not recommended to drink alcohol while taking this medcation

## 2022-07-22 DIAGNOSIS — H353211 Exudative age-related macular degeneration, right eye, with active choroidal neovascularization: Secondary | ICD-10-CM | POA: Diagnosis not present

## 2022-07-27 DIAGNOSIS — Z7901 Long term (current) use of anticoagulants: Secondary | ICD-10-CM | POA: Diagnosis not present

## 2022-07-27 DIAGNOSIS — F5101 Primary insomnia: Secondary | ICD-10-CM | POA: Diagnosis not present

## 2022-07-27 DIAGNOSIS — H3530 Unspecified macular degeneration: Secondary | ICD-10-CM | POA: Diagnosis not present

## 2022-07-27 DIAGNOSIS — I10 Essential (primary) hypertension: Secondary | ICD-10-CM | POA: Diagnosis not present

## 2022-07-27 DIAGNOSIS — I482 Chronic atrial fibrillation, unspecified: Secondary | ICD-10-CM | POA: Diagnosis not present

## 2022-07-27 DIAGNOSIS — K219 Gastro-esophageal reflux disease without esophagitis: Secondary | ICD-10-CM | POA: Diagnosis not present

## 2022-08-04 DIAGNOSIS — M810 Age-related osteoporosis without current pathological fracture: Secondary | ICD-10-CM | POA: Diagnosis not present

## 2022-08-04 DIAGNOSIS — F419 Anxiety disorder, unspecified: Secondary | ICD-10-CM | POA: Diagnosis not present

## 2022-08-11 ENCOUNTER — Telehealth: Payer: Self-pay | Admitting: Family Medicine

## 2022-08-11 NOTE — Telephone Encounter (Signed)
I spoke with Niece Seth Bake to r/s patients AWV.  She stated patient is @ [facility] and uses their physicians.  She wanted me to cancel AWV appt

## 2022-08-31 DIAGNOSIS — K219 Gastro-esophageal reflux disease without esophagitis: Secondary | ICD-10-CM | POA: Diagnosis not present

## 2022-08-31 DIAGNOSIS — Z7901 Long term (current) use of anticoagulants: Secondary | ICD-10-CM | POA: Diagnosis not present

## 2022-08-31 DIAGNOSIS — F5101 Primary insomnia: Secondary | ICD-10-CM | POA: Diagnosis not present

## 2022-08-31 DIAGNOSIS — I482 Chronic atrial fibrillation, unspecified: Secondary | ICD-10-CM | POA: Diagnosis not present

## 2022-08-31 DIAGNOSIS — I10 Essential (primary) hypertension: Secondary | ICD-10-CM | POA: Diagnosis not present

## 2022-08-31 DIAGNOSIS — H3530 Unspecified macular degeneration: Secondary | ICD-10-CM | POA: Diagnosis not present

## 2022-09-03 DIAGNOSIS — M810 Age-related osteoporosis without current pathological fracture: Secondary | ICD-10-CM | POA: Diagnosis not present

## 2022-09-03 DIAGNOSIS — F419 Anxiety disorder, unspecified: Secondary | ICD-10-CM | POA: Diagnosis not present

## 2022-09-08 ENCOUNTER — Ambulatory Visit: Payer: Medicare HMO

## 2022-09-16 DIAGNOSIS — H353221 Exudative age-related macular degeneration, left eye, with active choroidal neovascularization: Secondary | ICD-10-CM | POA: Diagnosis not present

## 2022-10-01 DIAGNOSIS — K219 Gastro-esophageal reflux disease without esophagitis: Secondary | ICD-10-CM | POA: Diagnosis not present

## 2022-10-01 DIAGNOSIS — I482 Chronic atrial fibrillation, unspecified: Secondary | ICD-10-CM | POA: Diagnosis not present

## 2022-10-01 DIAGNOSIS — F5101 Primary insomnia: Secondary | ICD-10-CM | POA: Diagnosis not present

## 2022-10-01 DIAGNOSIS — H3530 Unspecified macular degeneration: Secondary | ICD-10-CM | POA: Diagnosis not present

## 2022-10-01 DIAGNOSIS — Z7901 Long term (current) use of anticoagulants: Secondary | ICD-10-CM | POA: Diagnosis not present

## 2022-10-01 DIAGNOSIS — I10 Essential (primary) hypertension: Secondary | ICD-10-CM | POA: Diagnosis not present

## 2022-10-14 DIAGNOSIS — H353211 Exudative age-related macular degeneration, right eye, with active choroidal neovascularization: Secondary | ICD-10-CM | POA: Diagnosis not present

## 2022-10-16 DIAGNOSIS — E559 Vitamin D deficiency, unspecified: Secondary | ICD-10-CM | POA: Diagnosis not present

## 2022-10-16 DIAGNOSIS — Z79899 Other long term (current) drug therapy: Secondary | ICD-10-CM | POA: Diagnosis not present

## 2022-10-21 ENCOUNTER — Encounter: Payer: Self-pay | Admitting: Hematology and Oncology

## 2022-10-21 DIAGNOSIS — H353124 Nonexudative age-related macular degeneration, left eye, advanced atrophic with subfoveal involvement: Secondary | ICD-10-CM | POA: Diagnosis not present

## 2022-10-26 DIAGNOSIS — I10 Essential (primary) hypertension: Secondary | ICD-10-CM | POA: Diagnosis not present

## 2022-10-26 DIAGNOSIS — Z7901 Long term (current) use of anticoagulants: Secondary | ICD-10-CM | POA: Diagnosis not present

## 2022-10-26 DIAGNOSIS — I482 Chronic atrial fibrillation, unspecified: Secondary | ICD-10-CM | POA: Diagnosis not present

## 2022-10-26 DIAGNOSIS — F5101 Primary insomnia: Secondary | ICD-10-CM | POA: Diagnosis not present

## 2022-10-26 DIAGNOSIS — K219 Gastro-esophageal reflux disease without esophagitis: Secondary | ICD-10-CM | POA: Diagnosis not present

## 2022-10-26 DIAGNOSIS — H3530 Unspecified macular degeneration: Secondary | ICD-10-CM | POA: Diagnosis not present

## 2022-11-07 ENCOUNTER — Emergency Department (HOSPITAL_COMMUNITY): Payer: Medicare HMO

## 2022-11-07 ENCOUNTER — Other Ambulatory Visit: Payer: Self-pay

## 2022-11-07 ENCOUNTER — Emergency Department (HOSPITAL_COMMUNITY)
Admission: EM | Admit: 2022-11-07 | Discharge: 2022-11-07 | Disposition: A | Discharge status: Home / Self Care | Payer: Medicare HMO | Attending: Emergency Medicine | Admitting: Emergency Medicine

## 2022-11-07 DIAGNOSIS — W19XXXA Unspecified fall, initial encounter: Secondary | ICD-10-CM | POA: Diagnosis not present

## 2022-11-07 DIAGNOSIS — K047 Periapical abscess without sinus: Secondary | ICD-10-CM | POA: Diagnosis not present

## 2022-11-07 DIAGNOSIS — M25562 Pain in left knee: Secondary | ICD-10-CM | POA: Diagnosis not present

## 2022-11-07 DIAGNOSIS — I10 Essential (primary) hypertension: Secondary | ICD-10-CM | POA: Diagnosis not present

## 2022-11-07 DIAGNOSIS — R079 Chest pain, unspecified: Secondary | ICD-10-CM | POA: Diagnosis not present

## 2022-11-07 DIAGNOSIS — M4312 Spondylolisthesis, cervical region: Secondary | ICD-10-CM | POA: Diagnosis not present

## 2022-11-07 DIAGNOSIS — Z7901 Long term (current) use of anticoagulants: Secondary | ICD-10-CM | POA: Insufficient documentation

## 2022-11-07 DIAGNOSIS — M25512 Pain in left shoulder: Secondary | ICD-10-CM | POA: Insufficient documentation

## 2022-11-07 DIAGNOSIS — W01198A Fall on same level from slipping, tripping and stumbling with subsequent striking against other object, initial encounter: Secondary | ICD-10-CM | POA: Insufficient documentation

## 2022-11-07 DIAGNOSIS — S0101XA Laceration without foreign body of scalp, initial encounter: Secondary | ICD-10-CM | POA: Diagnosis not present

## 2022-11-07 DIAGNOSIS — R519 Headache, unspecified: Secondary | ICD-10-CM | POA: Diagnosis not present

## 2022-11-07 DIAGNOSIS — Z23 Encounter for immunization: Secondary | ICD-10-CM | POA: Insufficient documentation

## 2022-11-07 DIAGNOSIS — S0990XA Unspecified injury of head, initial encounter: Secondary | ICD-10-CM

## 2022-11-07 DIAGNOSIS — Z8673 Personal history of transient ischemic attack (TIA), and cerebral infarction without residual deficits: Secondary | ICD-10-CM | POA: Insufficient documentation

## 2022-11-07 DIAGNOSIS — I4891 Unspecified atrial fibrillation: Secondary | ICD-10-CM | POA: Insufficient documentation

## 2022-11-07 DIAGNOSIS — M542 Cervicalgia: Secondary | ICD-10-CM | POA: Insufficient documentation

## 2022-11-07 DIAGNOSIS — Z950 Presence of cardiac pacemaker: Secondary | ICD-10-CM | POA: Diagnosis not present

## 2022-11-07 LAB — CBC
HCT: 33.1 % — ABNORMAL LOW (ref 36.0–46.0)
Hemoglobin: 10.8 g/dL — ABNORMAL LOW (ref 12.0–15.0)
MCH: 31 pg (ref 26.0–34.0)
MCHC: 32.6 g/dL (ref 30.0–36.0)
MCV: 95.1 fL (ref 80.0–100.0)
Platelets: 142 10*3/uL — ABNORMAL LOW (ref 150–400)
RBC: 3.48 MIL/uL — ABNORMAL LOW (ref 3.87–5.11)
RDW: 15.8 % — ABNORMAL HIGH (ref 11.5–15.5)
WBC: 4.1 10*3/uL (ref 4.0–10.5)
nRBC: 0 % (ref 0.0–0.2)

## 2022-11-07 LAB — PROTIME-INR
INR: 1.1 (ref 0.8–1.2)
Prothrombin Time: 14.2 seconds (ref 11.4–15.2)

## 2022-11-07 LAB — BASIC METABOLIC PANEL
Anion gap: 11 (ref 5–15)
BUN: 22 mg/dL (ref 8–23)
CO2: 24 mmol/L (ref 22–32)
Calcium: 8.1 mg/dL — ABNORMAL LOW (ref 8.9–10.3)
Chloride: 102 mmol/L (ref 98–111)
Creatinine, Ser: 1.03 mg/dL — ABNORMAL HIGH (ref 0.44–1.00)
GFR, Estimated: 51 mL/min — ABNORMAL LOW (ref >60)
Glucose, Bld: 91 mg/dL (ref 70–99)
Potassium: 4.4 mmol/L (ref 3.5–5.1)
Sodium: 137 mmol/L (ref 135–145)

## 2022-11-07 LAB — MAGNESIUM: Magnesium: 1.8 mg/dL (ref 1.7–2.4)

## 2022-11-07 LAB — SAMPLE TO BLOOD BANK

## 2022-11-07 MED ORDER — TETANUS-DIPHTH-ACELL PERTUSSIS 5-2.5-18.5 LF-MCG/0.5 IM SUSY
0.5000 mL | PREFILLED_SYRINGE | Freq: Once | INTRAMUSCULAR | Status: AC
  Administered 2022-11-07: 0.5 mL via INTRAMUSCULAR
  Filled 2022-11-07: qty 0.5

## 2022-11-07 MED ORDER — LIDOCAINE-EPINEPHRINE 1 %-1:100000 IJ SOLN
20.0000 mL | Freq: Once | INTRAMUSCULAR | Status: AC
  Administered 2022-11-07: 20 mL via INTRADERMAL
  Filled 2022-11-07: qty 1

## 2022-11-07 NOTE — ED Provider Notes (Signed)
[place] EMERGENCY DEPARTMENT AT [facility] Provider Note   CSN: [id number] Arrival date & time: 11/07/22  1424     History  Chief Complaint  Patient presents with   level 2 fall on thinners    Megan Olsen is a 89 y.o. female.  89 year old female with a history of atrial fibrillation and stroke on Eliquis, heart block with a pacemaker who presents to the emergency department after a fall.  Patient was walking earlier today outside when she tripped and fell over an object on the ground.  Did strike the right side of her head with significant bleeding.  Says that she also hurt her right knee and right shoulder.  Denies any pain elsewhere.  No LOC.  Did take her Eliquis this morning.  No nausea or vomiting afterwards.  Denies any preceding symptoms.       Home Medications Prior to Admission medications   Medication Sig Start Date End Date Taking? Authorizing Provider  acetaminophen (TYLENOL) 650 MG CR tablet Take 650 mg by mouth every 8 (eight) hours as needed for pain.     [provider]  amLODipine (NORVASC) 10 MG tablet Take 1 tablet (10 mg total) by mouth daily. 03/17/22   Leroy Sea, MD  apixaban (ELIQUIS) 2.5 MG TABS tablet TAKE 1 TABLET TWICE DAILY 01/19/22   Duke Salvia, MD  bisoprolol (ZEBETA) 5 MG tablet Take 1 tablet (5 mg total) by mouth 2 (two) times daily. 03/17/22   Leroy Sea, MD  dexlansoprazole (DEXILANT) 60 MG capsule TAKE 1 CAPSULE EVERY DAY 11/12/21   Koberlein, Jannette Spanner C, MD  fluticasone (FLONASE) 50 MCG/ACT nasal spray PLACE 2 SPRAYS INTO BOTH NOSTRILS DAILY. Patient taking differently: Place 2 sprays into both nostrils as needed for allergies or rhinitis. 09/05/21   Wynn Banker, MD  hydrALAZINE (APRESOLINE) 50 MG tablet Take 1 tablet (50 mg total) by mouth every 8 (eight) hours. 03/17/22   Leroy Sea, MD  LORazepam (ATIVAN) 0.5 MG tablet Take 1 tablet (0.5 mg total) by mouth at bedtime as needed for anxiety or  sleep. 03/17/22   Leroy Sea, MD  losartan (COZAAR) 25 MG tablet Take 1 tablet (25 mg total) by mouth daily. 06/27/21   Wynn Banker, MD  Multiple Vitamins-Minerals (PRESERVISION AREDS 2) CAPS Take 1 capsule by mouth 2 (two) times daily. 04/15/17   Gordy Savers, MD  nitroGLYCERIN (NITROSTAT) 0.4 MG SL tablet Place 1 tablet (0.4 mg total) under the tongue every 5 (five) minutes x 3 doses as needed for chest pain. 02/01/19   Rhetta Mura, MD  NON FORMULARY Place 1 Dose into both eyes See admin instructions. Receives injections into both eyes every 5-8 weeks at Dr. Eliane Decree office [facility])    [provider]  traZODone (DESYREL) 100 MG tablet Take 1 tablet (100 mg total) by mouth at bedtime. 06/27/21   Wynn Banker, MD      Allergies    Propoxyphene n-acetaminophen, Celecoxib, Fexofenadine, and Penicillins    Review of Systems   Review of Systems  Physical Exam Updated Vital Signs BP 133/64   Pulse 65   Temp (!) 97.4 F (36.3 C) (Oral)   Resp 15   Ht 5\' 5"  (1.651 m)   Wt 54.4 kg   SpO2 100%   BMI 19.96 kg/m  Physical Exam Vitals and nursing note reviewed.  Constitutional:      General: She is not in acute distress.  Appearance: She is well-developed.  HENT:     Head: Normocephalic.     Comments: Laceration to left forehead    Right Ear: External ear normal.     Left Ear: External ear normal.     Nose: Nose normal.  Eyes:     Extraocular Movements: Extraocular movements intact.     Conjunctiva/sclera: Conjunctivae normal.     Pupils: Pupils are equal, round, and reactive to light.  Neck:     Comments: Complaining of neck pain with left paraspinal tenderness to palpation but no step-offs noted Cardiovascular:     Rate and Rhythm: Normal rate.     Heart sounds: No murmur heard. Pulmonary:     Effort: Pulmonary effort is normal. No respiratory distress.     Breath sounds: Normal breath sounds.  Abdominal:      General: Abdomen is flat. There is no distension.     Palpations: Abdomen is soft. There is no mass.     Tenderness: There is no abdominal tenderness. There is no guarding.  Musculoskeletal:     Cervical back: Normal range of motion and neck supple.     Right lower leg: No edema.     Left lower leg: No edema.     Comments: Tenderness to palpation of left shoulder and left knee.  No additional tenderness to palpation of shoulders, hips, knees, wrists, ankles.  Does have abrasions of the left knee.  Skin:    General: Skin is warm and dry.  Neurological:     Mental Status: She is alert and oriented to person, place, and time. Mental status is at baseline.  Psychiatric:        Mood and Affect: Mood normal.     ED Results / Procedures / Treatments   Labs (all labs ordered are listed, but only abnormal results are displayed) Labs Reviewed  CBC - Abnormal; Notable for the following components:      Result Value   RBC 3.48 (*)    Hemoglobin 10.8 (*)    HCT 33.1 (*)    RDW 15.8 (*)    Platelets 142 (*)    All other components within normal limits  BASIC METABOLIC PANEL - Abnormal; Notable for the following components:   Creatinine, Ser 1.03 (*)    Calcium 8.1 (*)    GFR, Estimated 51 (*)    All other components within normal limits  PROTIME-INR  MAGNESIUM  SAMPLE TO BLOOD BANK    EKG EKG Interpretation  Date/Time:  Saturday November 07 2022 14:57:13 EDT Ventricular Rate:  65 PR Interval:    QRS Duration: 144 QT Interval:  476 QTC Calculation: 495 R Axis:   -79 Text Interpretation: Interpretation limited secondary to artifact   ventricularly paced rhythm LVH with IVCD, LAD and secondary repol abnrm  No significant change since last tracing  Confirmed by Vonita Moss [phone]) on 11/07/2022 4:24:15 PM  Radiology CT HEAD WO CONTRAST  Result Date: 11/07/2022 CLINICAL DATA:  Fall with head and neck pain. EXAM: CT HEAD WITHOUT CONTRAST CT CERVICAL SPINE WITHOUT CONTRAST  TECHNIQUE: Multidetector CT imaging of the head and cervical spine was performed following the standard protocol without intravenous contrast. Multiplanar CT image reconstructions of the cervical spine were also generated. RADIATION DOSE REDUCTION: This exam was performed according to the departmental dose-optimization program which includes automated exposure control, adjustment of the mA and/or kV according to patient size and/or use of iterative reconstruction technique. COMPARISON:  CT head dated 03/15/2022 and cervical  spine dated 03/13/2022 FINDINGS: CT HEAD FINDINGS Brain: No evidence of acute infarction, hemorrhage, hydrocephalus, extra-axial collection or mass lesion/mass effect. Periventricular white matter hypoattenuation likely represents chronic small vessel ischemic disease. Vascular: There are vascular calcifications in the carotid siphons. Skull: Normal. Negative for fracture or focal lesion. Sinuses/Orbits: There is left-greater-than-right maxillary sinus disease. Other: There is soft tissue swelling/laceration of the left frontal scalp. There is a periapical abscess of the left first maxillary molar. CT CERVICAL SPINE FINDINGS Alignment: Unchanged chronic anterolisthesis of C4 on C5 measuring 3 mm. Levocurvature at the C5-6 is unchanged. Skull base and vertebrae: No acute fracture. No primary bone lesion or focal pathologic process. Soft tissues and spinal canal: No prevertebral fluid or swelling. No visible canal hematoma. Disc levels:  Severe multilevel degenerative disc and joint disease. Upper chest: Negative. Other: None. IMPRESSION: 1. No acute intracranial process. 2. No acute fracture or traumatic subluxation of the cervical spine. 3. Periapical abscess of the left first maxillary molar. Electronically Signed   By: Romona Curls M.D.   On: 11/07/2022 16:13   CT CERVICAL SPINE WO CONTRAST  Result Date: 11/07/2022 CLINICAL DATA:  Fall with head and neck pain. EXAM: CT HEAD WITHOUT  CONTRAST CT CERVICAL SPINE WITHOUT CONTRAST TECHNIQUE: Multidetector CT imaging of the head and cervical spine was performed following the standard protocol without intravenous contrast. Multiplanar CT image reconstructions of the cervical spine were also generated. RADIATION DOSE REDUCTION: This exam was performed according to the departmental dose-optimization program which includes automated exposure control, adjustment of the mA and/or kV according to patient size and/or use of iterative reconstruction technique. COMPARISON:  CT head dated 03/15/2022 and cervical spine dated 03/13/2022 FINDINGS: CT HEAD FINDINGS Brain: No evidence of acute infarction, hemorrhage, hydrocephalus, extra-axial collection or mass lesion/mass effect. Periventricular white matter hypoattenuation likely represents chronic small vessel ischemic disease. Vascular: There are vascular calcifications in the carotid siphons. Skull: Normal. Negative for fracture or focal lesion. Sinuses/Orbits: There is left-greater-than-right maxillary sinus disease. Other: There is soft tissue swelling/laceration of the left frontal scalp. There is a periapical abscess of the left first maxillary molar. CT CERVICAL SPINE FINDINGS Alignment: Unchanged chronic anterolisthesis of C4 on C5 measuring 3 mm. Levocurvature at the C5-6 is unchanged. Skull base and vertebrae: No acute fracture. No primary bone lesion or focal pathologic process. Soft tissues and spinal canal: No prevertebral fluid or swelling. No visible canal hematoma. Disc levels:  Severe multilevel degenerative disc and joint disease. Upper chest: Negative. Other: None. IMPRESSION: 1. No acute intracranial process. 2. No acute fracture or traumatic subluxation of the cervical spine. 3. Periapical abscess of the left first maxillary molar. Electronically Signed   By: Romona Curls M.D.   On: 11/07/2022 16:13   DG Shoulder Left  Result Date: 11/07/2022 CLINICAL DATA:  Fall and left shoulder pain.  EXAM: LEFT SHOULDER - 2+ VIEW COMPARISON:  None Available. FINDINGS: There is no acute fracture or dislocation. The bones are osteopenic. Degenerative changes of the left shoulder. There is elevation of the left humeral head suggestive of chronic rotator cuff injury. Left pectoral pacemaker device. The soft tissues are unremarkable. IMPRESSION: 1. No acute fracture or dislocation. 2. Degenerative changes of the left shoulder. Electronically Signed   By: Elgie Collard M.D.   On: 11/07/2022 15:42   DG Knee 1-2 Views Left  Result Date: 11/07/2022 CLINICAL DATA:  Pain after fall EXAM: LEFT KNEE - 2 VIEW COMPARISON:  None Available. FINDINGS: Osteopenia. No fracture or dislocation. Preserved  joint spaces. Scattered vascular calcifications. No joint effusion on lateral view. IMPRESSION: Osteopenia.  No acute osseous abnormality Electronically Signed   By: Karen Kays M.D.   On: 11/07/2022 15:41   DG Chest 1 View  Result Date: 11/07/2022 CLINICAL DATA:  Pain after fall EXAM: CHEST  1 VIEW COMPARISON:  X-ray 06/08/2022 FINDINGS: Enlarged cardiopericardial silhouette. No edema. No pneumothorax, effusion or edema. Overlapping cardiac leads. Left chest pacemaker IMPRESSION: Pacemaker.  Enlarged heart. Electronically Signed   By: Karen Kays M.D.   On: 11/07/2022 15:40    Procedures .Marland KitchenLaceration Repair  Date/Time: 11/07/2022 5:34 PM  Performed by: Rondel Baton, MD Authorized by: Rondel Baton, MD   Consent:    Consent obtained:  Verbal   Consent given by:  Patient Laceration details:    Location:  Scalp   Scalp location:  Frontal   Length (cm):  4   Depth (mm):  5 Pre-procedure details:    Preparation:  Patient was prepped and draped in usual sterile fashion and imaging obtained to evaluate for foreign bodies Exploration:    Imaging obtained comment:  CT   Imaging outcome: foreign body not noted     Wound exploration: wound explored through full range of motion     Wound extent:  fascia violated and muscle damage     Contaminated: no   Treatment:    Area cleansed with:  Saline   Amount of cleaning:  Extensive   Irrigation solution:  Tap water   Irrigation volume:  400   Irrigation method:  Syringe   Layers/structures repaired:  Vernona Rieger:    Suture size:  4-0   Suture material:  Plain gut and chromic gut   Suture technique:  Simple interrupted   Number of sutures:  1 Skin repair:    Repair method:  Sutures   Suture size:  4-0   Suture material:  Prolene   Suture technique:  Simple interrupted   Number of sutures:  3 Approximation:    Approximation:  Close Repair type:    Repair type:  Complex Post-procedure details:    Dressing:  Non-adherent dressing   Procedure completion:  Tolerated well, no immediate complications    Medications Ordered in ED Medications  lidocaine-EPINEPHrine (XYLOCAINE W/EPI) 1 %-1:100000 (with pres) injection 20 mL (has no administration in time range)  Tdap (BOOSTRIX) injection 0.5 mL (0.5 mLs Intramuscular Given 11/07/22 1456)    ED Course/ Medical Decision Making/ A&P                             Medical Decision Making Amount and/or Complexity of Data Reviewed Labs: ordered. Radiology: ordered.  Risk Prescription drug management.   Megan Olsen is a 89 y.o. female with comorbidities that complicate the patient evaluation including atrial fibrillation and stroke on Eliquis, heart block with a pacemaker who presents to the emergency department after a fall.    Initial Ddx:  TBI, C-spine injury, scalp laceration, fracture  MDM:  Appears the patient had a mechanical fall.  Does have a laceration that involves the galea that will need to be repaired.  Since she is on blood thinners we will obtain a CT of the head and cervical spine as well.  She is due for tetanus update at this time as well.  With her knee and arm pain will obtain x-rays to evaluate for fractures.  Since she is on blood thinners will be a  level  2 trauma.  Plan:  Labs EKG Chest x-ray Left shoulder x-ray Left knee x-ray CT head CT C-spine C-collar Tetanus Laceration repair  ED Summary/Re-evaluation:  Patient's imaging did not show any fractures or other acute abnormalities.  Her laceration was repaired which did involve the galea.  Instructed her to follow-up in 7 to 10 days to have her stitches removed.  Was also notified that she has a periapical abscess in her teeth and will need to follow-up with dentistry.  Given instructions on how to care for her laceration as well.  This patient presents to the ED for concern of complaints listed in HPI, this involves an extensive number of treatment options, and is a complaint that carries with it a high risk of complications and morbidity. Disposition including potential need for admission considered.   Dispo: DC Home. Return precautions discussed including, but not limited to, those listed in the AVS. Allowed pt time to ask questions which were answered fully prior to dc.  Additional history obtained from EMS Records reviewed Outpatient Clinic Notes The following labs were independently interpreted: Chemistry and show CKD I independently reviewed the following imaging with scope of interpretation limited to determining acute life threatening conditions related to emergency care: CT Head and agree with the radiologist interpretation with the following exceptions: none I personally reviewed and interpreted cardiac monitoring:  paced rhythm I personally reviewed and interpreted the pt's EKG: see above for interpretation  I have reviewed the patients home medications and made adjustments as needed Social Determinants of health:  Elderly  Final Clinical Impression(s) / ED Diagnoses Final diagnoses:  Minor head trauma  Laceration of scalp, initial encounter  Periapical abscess    Rx / DC Orders ED Discharge Orders     None         Rondel Baton, MD 11/07/22 1740

## 2022-11-07 NOTE — ED Notes (Signed)
Pt alert the laceration to the lt forehead continues to bleed  suture cart and supplies at  the bedside

## 2022-11-07 NOTE — Progress Notes (Signed)
   11/07/22 1430  Spiritual Encounters  Type of Visit Initial  Care provided to: Patient  Conversation partners present during encounter Nurse  Referral source Trauma page  Reason for visit Code  OnCall Visit Yes  Spiritual Framework  Presenting Themes Meaning/purpose/sources of inspiration;Community and relationships  Interventions  Spiritual Care Interventions Made Established relationship of care and support;Compassionate presence   Responded to page, visited with patient. Patient said she fell but doesn't remember how or why. She said her niece is aware of her being in the hospital. She resides in a nursing facility. Talked and listened until nurse returned. Served as comforting presence.

## 2022-11-07 NOTE — ED Triage Notes (Signed)
Pt arrived via [facility] from [facility]. Pt states she got up and was walking and tripped over the garden edge and fell and hit her head on the garden edge. Pt has lac 2 cmx1cm on left side of forehead. Bleeding controlled. Pt is A&Ox4. VSS. Pt c/o neck pain and shoulders bilat pain. Pt on eliquis

## 2022-11-07 NOTE — ED Notes (Signed)
I placed c-collar on pt

## 2022-11-07 NOTE — ED Notes (Signed)
Trauma Response Nurse Documentation   Megan Olsen is a 89 y.o. female arriving to [facility] ED via EMS  On Eliquis (apixaban) daily. Trauma was activated as a Level 2 by ED Charge RN based on the following trauma criteria Elderly patients > 65 with head trauma on anti-coagulation (excluding ASA). Trauma team at the bedside on patient arrival.   Patient cleared for CT by Dr. Eloise Harman. Pt transported to CT with trauma response nurse present to monitor. RN remained with the patient throughout their absence from the department for clinical observation.   GCS 15.  History   Past Medical History:  Diagnosis Date   Age-related macular degeneration, wet, both eyes (HCC)    Anxiety disorder 07/06/2014   Arthritis    "all over" (04/27/2018)   Atrial fibrillation (HCC)    catheter ablation of SVT in 2002   Benign essential HTN 07/06/2014   Constipation, intermittent 08/04/2007   CVA (cerebrovascular accident) (HCC) 2003   left brain; denies residual on 04/27/2018   Depression    GERD (gastroesophageal reflux disease)    Heart murmur    hx (04/27/2018)   Hypothyroidism 09/07/2014   "off RX now" (04/27/2018)   Iron deficiency anemia    Iron deficiency anemia, unspecified 01/02/2013   Osteoarthritis    Osteoporosis 01/11/2007   Pneumonia    "one time; years and years ago" (04/27/2018)   Presence of permanent cardiac pacemaker 04/27/2018    Dual Chamber   Right BBB/left ant fasc block      Past Surgical History:  Procedure Laterality Date   APPENDECTOMY     CATARACT EXTRACTION W/ INTRAOCULAR LENS  IMPLANT, BILATERAL Bilateral    CHOLECYSTECTOMY OPEN     COLONOSCOPY     EYE SURGERY     FRACTURE SURGERY     HIP PINNING,CANNULATED Left 07/07/2014   Procedure: CANNULATED HIP PINNING;  Surgeon: Shelda Pal, MD;  Location: [facility] ORS;  Service: Orthopedics;  Laterality: Left;   INSERT / REPLACE / REMOVE PACEMAKER  04/27/2018    Dual Chamber   JOINT REPLACEMENT     LAPAROSCOPIC OVARIAN  CYSTECTOMY     LEFT HEART CATH AND CORONARY ANGIOGRAPHY N/A 01/31/2019   Procedure: LEFT HEART CATH AND CORONARY ANGIOGRAPHY;  Surgeon: Marykay Lex, MD;  Location: [facility] INVASIVE CV LAB;  Service: Cardiovascular;  Laterality: N/A;   NASAL SEPTUM SURGERY     PACEMAKER IMPLANT N/A 04/27/2018   Procedure: PACEMAKER IMPLANT - Dual Chamber;  Surgeon: Duke Salvia, MD;  Location: [facility] INVASIVE CV LAB;  Service: Cardiovascular;  Laterality: N/A;   SVT ABLATION  2002   TONSILLECTOMY     TOTAL ABDOMINAL HYSTERECTOMY     TOTAL HIP ARTHROPLASTY Right 02/23/2017   Procedure: RIGHT TOTAL HIP ARTHROPLASTY ANTERIOR APPROACH;  Surgeon: Durene Romans, MD;  Location: [facility] ORS;  Service: Orthopedics;  Laterality: Right;   VITRECTOMY Right 2008   dr Luciana Axe.  Vitrectomy and removal of tissue.      Initial Focused Assessment (If applicable, or please see trauma documentation): - A/Ox4 - PERRLA - Lac to L forehead - minimal bleeding through gauze - abrasions to bilateral knees - VS WDL  CT's Completed:   CT Head and CT C-Spine   Interventions:  - C-collar placed on pt - 20G PIV tp R AC - Labs drawn - Suturing forehead lac - CT head and c-spine - CXR - L shoulder XR - L knee XR - tdap  Plan for disposition:  Other  Consults completed:  none at 1515.  Event Summary: Pt is from [facility] facility and fell while outside, striking her head on the corner of a garden bed.  No LOC.  Pt is on eliquis.  BIB [facility].  Bedside handoff with ED RN Alana.    Janora Norlander  Trauma Response RN  Please call TRN at [phone] for further assistance.

## 2022-11-07 NOTE — Discharge Instructions (Signed)
You were seen for your fall and forehead cut (laceration) in the emergency department.   At home, please keep the area completely dry for 24 hours.  After that you may wash with soap and water but do not submerge until your stitches are removed or they fall out.    Check your MyChart online for the results of any tests that had not resulted by the time you left the emergency department.   Follow-up to have your stitches removed at your primary doctor's office, urgent care, or emergency department in 7-10 days.   Return immediately to the emergency department if you experience any of the following: Drainage from your wound, redness around your wound, fevers, or any other concerning symptoms.    Thank you for visiting our Emergency Department. It was a pleasure taking care of you today.

## 2022-11-07 NOTE — ED Notes (Signed)
Patient transported to CT by TRN ?

## 2022-11-07 NOTE — Progress Notes (Signed)
Orthopedic Tech Progress Note Patient Details:  Megan Olsen 03-21-1928 [id number] Level 2 Trauma  Patient ID: [name], female   DOB: 04/07/1928, 89 y.o.   MRN: [id number]  Smitty Pluck 11/07/2022, 3:04 PM

## 2022-11-10 ENCOUNTER — Telehealth: Payer: Self-pay | Admitting: *Deleted

## 2022-11-10 NOTE — Telephone Encounter (Signed)
     Patient  visit on 11/07/2022  at [facility]  was for treatment  Have you been able to follow up with your primary care physician? patient in ASL and has all she needs  The patient was or was not able to obtain any needed medicine or equipment.  Are there diet recommendations that you are having difficulty following?  Patient expresses understanding of discharge instructions and education provided has no other needs at this time.   Yehuda Mao Greenauer -[facility], Population Health [phone] [address] [place] , [place] [address] Email : [email or web address]

## 2022-11-11 DIAGNOSIS — H353114 Nonexudative age-related macular degeneration, right eye, advanced atrophic with subfoveal involvement: Secondary | ICD-10-CM | POA: Diagnosis not present

## 2022-11-16 DIAGNOSIS — W19XXXA Unspecified fall, initial encounter: Secondary | ICD-10-CM | POA: Diagnosis not present

## 2022-11-16 DIAGNOSIS — I482 Chronic atrial fibrillation, unspecified: Secondary | ICD-10-CM | POA: Diagnosis not present

## 2022-11-16 DIAGNOSIS — I10 Essential (primary) hypertension: Secondary | ICD-10-CM | POA: Diagnosis not present

## 2022-11-23 DIAGNOSIS — M5459 Other low back pain: Secondary | ICD-10-CM | POA: Diagnosis not present

## 2022-11-23 DIAGNOSIS — W010XXS Fall on same level from slipping, tripping and stumbling without subsequent striking against object, sequela: Secondary | ICD-10-CM | POA: Diagnosis not present

## 2022-11-23 DIAGNOSIS — J309 Allergic rhinitis, unspecified: Secondary | ICD-10-CM | POA: Diagnosis not present

## 2022-11-23 DIAGNOSIS — Z4802 Encounter for removal of sutures: Secondary | ICD-10-CM | POA: Diagnosis not present

## 2022-11-25 DIAGNOSIS — H353124 Nonexudative age-related macular degeneration, left eye, advanced atrophic with subfoveal involvement: Secondary | ICD-10-CM | POA: Diagnosis not present

## 2022-12-11 DIAGNOSIS — M5136 Other intervertebral disc degeneration, lumbar region: Secondary | ICD-10-CM | POA: Diagnosis not present

## 2022-12-11 DIAGNOSIS — S39012A Strain of muscle, fascia and tendon of lower back, initial encounter: Secondary | ICD-10-CM | POA: Diagnosis not present

## 2022-12-11 DIAGNOSIS — M25552 Pain in left hip: Secondary | ICD-10-CM | POA: Diagnosis not present

## 2022-12-16 DIAGNOSIS — H353114 Nonexudative age-related macular degeneration, right eye, advanced atrophic with subfoveal involvement: Secondary | ICD-10-CM | POA: Diagnosis not present

## 2022-12-17 DIAGNOSIS — F419 Anxiety disorder, unspecified: Secondary | ICD-10-CM | POA: Diagnosis not present

## 2022-12-17 DIAGNOSIS — M810 Age-related osteoporosis without current pathological fracture: Secondary | ICD-10-CM | POA: Diagnosis not present

## 2022-12-28 DIAGNOSIS — I482 Chronic atrial fibrillation, unspecified: Secondary | ICD-10-CM | POA: Diagnosis not present

## 2022-12-28 DIAGNOSIS — F5101 Primary insomnia: Secondary | ICD-10-CM | POA: Diagnosis not present

## 2022-12-28 DIAGNOSIS — H3530 Unspecified macular degeneration: Secondary | ICD-10-CM | POA: Diagnosis not present

## 2022-12-28 DIAGNOSIS — H353221 Exudative age-related macular degeneration, left eye, with active choroidal neovascularization: Secondary | ICD-10-CM | POA: Diagnosis not present

## 2022-12-28 DIAGNOSIS — I10 Essential (primary) hypertension: Secondary | ICD-10-CM | POA: Diagnosis not present

## 2022-12-28 DIAGNOSIS — Z7901 Long term (current) use of anticoagulants: Secondary | ICD-10-CM | POA: Diagnosis not present

## 2022-12-28 DIAGNOSIS — K219 Gastro-esophageal reflux disease without esophagitis: Secondary | ICD-10-CM | POA: Diagnosis not present

## 2023-01-01 ENCOUNTER — Telehealth: Payer: Self-pay | Admitting: *Deleted

## 2023-01-01 DIAGNOSIS — F411 Generalized anxiety disorder: Secondary | ICD-10-CM

## 2023-01-01 DIAGNOSIS — I10 Essential (primary) hypertension: Secondary | ICD-10-CM

## 2023-01-01 DIAGNOSIS — I4821 Permanent atrial fibrillation: Secondary | ICD-10-CM

## 2023-01-01 DIAGNOSIS — M810 Age-related osteoporosis without current pathological fracture: Secondary | ICD-10-CM

## 2023-01-01 NOTE — Telephone Encounter (Signed)
-----   Message from Karie Georges, MD sent at 01/01/2023  9:06 AM EDT ----- Regarding: RE: 2300-Pharmacy Referral Ok to place 2300 referral ----- Message ----- From: Johnella Moloney, CMA Sent: 12/22/2022   9:09 AM EDT To: Karie Georges, MD Subject: FW: 2300-Pharmacy Referral                      ----- Message ----- From: Sherrill Raring, [facility] Sent: 12/22/2022   9:03 AM EDT To: Karie Georges, MD; Lisa Roca Subject: 2300-Pharmacy Referral                         Hello,  Patient is currently flagged as high risk for hospitalization and is eligible to meet with me through the Care Coordination program.  Could a 2300-Pharmacy referral for medication management be placed for the patient so that I can get them scheduled?  Thank you! Sherrill Raring Clinical Pharmacist [phone]

## 2023-01-01 NOTE — Telephone Encounter (Signed)
Referral re-entered under [id number] per practice administrator. 

## 2023-01-01 NOTE — Telephone Encounter (Signed)
Order entered as below.  

## 2023-01-01 NOTE — Addendum Note (Signed)
Addended by: Johnella Moloney on: 01/01/2023 09:57 AM   Modules accepted: Orders

## 2023-01-13 DIAGNOSIS — H353211 Exudative age-related macular degeneration, right eye, with active choroidal neovascularization: Secondary | ICD-10-CM | POA: Diagnosis not present

## 2023-01-15 DIAGNOSIS — F419 Anxiety disorder, unspecified: Secondary | ICD-10-CM | POA: Diagnosis not present

## 2023-01-15 DIAGNOSIS — M810 Age-related osteoporosis without current pathological fracture: Secondary | ICD-10-CM | POA: Diagnosis not present

## 2023-02-04 ENCOUNTER — Telehealth: Payer: Self-pay | Admitting: *Deleted

## 2023-02-04 DIAGNOSIS — I214 Non-ST elevation (NSTEMI) myocardial infarction: Secondary | ICD-10-CM

## 2023-02-04 DIAGNOSIS — I4891 Unspecified atrial fibrillation: Secondary | ICD-10-CM

## 2023-02-04 DIAGNOSIS — I10 Essential (primary) hypertension: Secondary | ICD-10-CM

## 2023-02-04 NOTE — Telephone Encounter (Signed)
-----   Message from Karie Georges sent at 02/04/2023 10:15 AM EDT ----- Regarding: FW: 2300-Pharmacy Referral Ok to place referral to pharmacy if not already done. ----- Message ----- From: Johnella Moloney, CMA Sent: 12/22/2022   9:09 AM EDT To: Karie Georges, MD Subject: FW: 2300-Pharmacy Referral                      ----- Message ----- From: Sherrill Raring, [facility] Sent: 12/22/2022   9:03 AM EDT To: Karie Georges, MD; Lisa Roca Subject: 2300-Pharmacy Referral                         Hello,  Patient is currently flagged as high risk for hospitalization and is eligible to meet with me through the Care Coordination program.  Could a 2300-Pharmacy referral for medication management be placed for the patient so that I can get them scheduled?  Thank you! Sherrill Raring Clinical Pharmacist [phone]

## 2023-02-04 NOTE — Telephone Encounter (Signed)
Referral placed as below.  

## 2023-02-15 DIAGNOSIS — Z7901 Long term (current) use of anticoagulants: Secondary | ICD-10-CM | POA: Diagnosis not present

## 2023-02-15 DIAGNOSIS — I482 Chronic atrial fibrillation, unspecified: Secondary | ICD-10-CM | POA: Diagnosis not present

## 2023-02-15 DIAGNOSIS — H3530 Unspecified macular degeneration: Secondary | ICD-10-CM | POA: Diagnosis not present

## 2023-02-15 DIAGNOSIS — H353114 Nonexudative age-related macular degeneration, right eye, advanced atrophic with subfoveal involvement: Secondary | ICD-10-CM | POA: Diagnosis not present

## 2023-02-15 DIAGNOSIS — I10 Essential (primary) hypertension: Secondary | ICD-10-CM | POA: Diagnosis not present

## 2023-02-16 DIAGNOSIS — I10 Essential (primary) hypertension: Secondary | ICD-10-CM | POA: Diagnosis not present

## 2023-02-16 DIAGNOSIS — I482 Chronic atrial fibrillation, unspecified: Secondary | ICD-10-CM | POA: Diagnosis not present

## 2023-02-22 DIAGNOSIS — I10 Essential (primary) hypertension: Secondary | ICD-10-CM | POA: Diagnosis not present

## 2023-02-22 DIAGNOSIS — S81831A Puncture wound without foreign body, right lower leg, initial encounter: Secondary | ICD-10-CM | POA: Diagnosis not present

## 2023-02-27 DIAGNOSIS — F419 Anxiety disorder, unspecified: Secondary | ICD-10-CM | POA: Diagnosis not present

## 2023-02-27 DIAGNOSIS — I495 Sick sinus syndrome: Secondary | ICD-10-CM | POA: Diagnosis not present

## 2023-02-27 DIAGNOSIS — I10 Essential (primary) hypertension: Secondary | ICD-10-CM | POA: Diagnosis not present

## 2023-02-27 DIAGNOSIS — K219 Gastro-esophageal reflux disease without esophagitis: Secondary | ICD-10-CM | POA: Diagnosis not present

## 2023-02-27 DIAGNOSIS — S81811D Laceration without foreign body, right lower leg, subsequent encounter: Secondary | ICD-10-CM | POA: Diagnosis not present

## 2023-02-27 DIAGNOSIS — I4891 Unspecified atrial fibrillation: Secondary | ICD-10-CM | POA: Diagnosis not present

## 2023-02-27 DIAGNOSIS — M810 Age-related osteoporosis without current pathological fracture: Secondary | ICD-10-CM | POA: Diagnosis not present

## 2023-02-27 DIAGNOSIS — D509 Iron deficiency anemia, unspecified: Secondary | ICD-10-CM | POA: Diagnosis not present

## 2023-02-27 DIAGNOSIS — I483 Typical atrial flutter: Secondary | ICD-10-CM | POA: Diagnosis not present

## 2023-03-05 DIAGNOSIS — I10 Essential (primary) hypertension: Secondary | ICD-10-CM | POA: Diagnosis not present

## 2023-03-05 DIAGNOSIS — M810 Age-related osteoporosis without current pathological fracture: Secondary | ICD-10-CM | POA: Diagnosis not present

## 2023-03-05 DIAGNOSIS — S81811D Laceration without foreign body, right lower leg, subsequent encounter: Secondary | ICD-10-CM | POA: Diagnosis not present

## 2023-03-05 DIAGNOSIS — I495 Sick sinus syndrome: Secondary | ICD-10-CM | POA: Diagnosis not present

## 2023-03-05 DIAGNOSIS — I4891 Unspecified atrial fibrillation: Secondary | ICD-10-CM | POA: Diagnosis not present

## 2023-03-05 DIAGNOSIS — F419 Anxiety disorder, unspecified: Secondary | ICD-10-CM | POA: Diagnosis not present

## 2023-03-05 DIAGNOSIS — K219 Gastro-esophageal reflux disease without esophagitis: Secondary | ICD-10-CM | POA: Diagnosis not present

## 2023-03-05 DIAGNOSIS — D509 Iron deficiency anemia, unspecified: Secondary | ICD-10-CM | POA: Diagnosis not present

## 2023-03-05 DIAGNOSIS — I483 Typical atrial flutter: Secondary | ICD-10-CM | POA: Diagnosis not present

## 2023-03-08 DIAGNOSIS — I10 Essential (primary) hypertension: Secondary | ICD-10-CM | POA: Diagnosis not present

## 2023-03-08 DIAGNOSIS — M810 Age-related osteoporosis without current pathological fracture: Secondary | ICD-10-CM | POA: Diagnosis not present

## 2023-03-08 DIAGNOSIS — D509 Iron deficiency anemia, unspecified: Secondary | ICD-10-CM | POA: Diagnosis not present

## 2023-03-08 DIAGNOSIS — S81811D Laceration without foreign body, right lower leg, subsequent encounter: Secondary | ICD-10-CM | POA: Diagnosis not present

## 2023-03-08 DIAGNOSIS — F419 Anxiety disorder, unspecified: Secondary | ICD-10-CM | POA: Diagnosis not present

## 2023-03-08 DIAGNOSIS — K219 Gastro-esophageal reflux disease without esophagitis: Secondary | ICD-10-CM | POA: Diagnosis not present

## 2023-03-08 DIAGNOSIS — I483 Typical atrial flutter: Secondary | ICD-10-CM | POA: Diagnosis not present

## 2023-03-08 DIAGNOSIS — I495 Sick sinus syndrome: Secondary | ICD-10-CM | POA: Diagnosis not present

## 2023-03-08 DIAGNOSIS — I4891 Unspecified atrial fibrillation: Secondary | ICD-10-CM | POA: Diagnosis not present

## 2023-03-12 DIAGNOSIS — I483 Typical atrial flutter: Secondary | ICD-10-CM | POA: Diagnosis not present

## 2023-03-12 DIAGNOSIS — D509 Iron deficiency anemia, unspecified: Secondary | ICD-10-CM | POA: Diagnosis not present

## 2023-03-12 DIAGNOSIS — M810 Age-related osteoporosis without current pathological fracture: Secondary | ICD-10-CM | POA: Diagnosis not present

## 2023-03-12 DIAGNOSIS — K219 Gastro-esophageal reflux disease without esophagitis: Secondary | ICD-10-CM | POA: Diagnosis not present

## 2023-03-12 DIAGNOSIS — F419 Anxiety disorder, unspecified: Secondary | ICD-10-CM | POA: Diagnosis not present

## 2023-03-12 DIAGNOSIS — S81811D Laceration without foreign body, right lower leg, subsequent encounter: Secondary | ICD-10-CM | POA: Diagnosis not present

## 2023-03-12 DIAGNOSIS — I10 Essential (primary) hypertension: Secondary | ICD-10-CM | POA: Diagnosis not present

## 2023-03-12 DIAGNOSIS — I495 Sick sinus syndrome: Secondary | ICD-10-CM | POA: Diagnosis not present

## 2023-03-12 DIAGNOSIS — I4891 Unspecified atrial fibrillation: Secondary | ICD-10-CM | POA: Diagnosis not present

## 2023-03-15 DIAGNOSIS — I4891 Unspecified atrial fibrillation: Secondary | ICD-10-CM | POA: Diagnosis not present

## 2023-03-15 DIAGNOSIS — F419 Anxiety disorder, unspecified: Secondary | ICD-10-CM | POA: Diagnosis not present

## 2023-03-15 DIAGNOSIS — Z7901 Long term (current) use of anticoagulants: Secondary | ICD-10-CM | POA: Diagnosis not present

## 2023-03-15 DIAGNOSIS — D509 Iron deficiency anemia, unspecified: Secondary | ICD-10-CM | POA: Diagnosis not present

## 2023-03-15 DIAGNOSIS — S81831A Puncture wound without foreign body, right lower leg, initial encounter: Secondary | ICD-10-CM | POA: Diagnosis not present

## 2023-03-15 DIAGNOSIS — I10 Essential (primary) hypertension: Secondary | ICD-10-CM | POA: Diagnosis not present

## 2023-03-15 DIAGNOSIS — I483 Typical atrial flutter: Secondary | ICD-10-CM | POA: Diagnosis not present

## 2023-03-15 DIAGNOSIS — M810 Age-related osteoporosis without current pathological fracture: Secondary | ICD-10-CM | POA: Diagnosis not present

## 2023-03-15 DIAGNOSIS — I495 Sick sinus syndrome: Secondary | ICD-10-CM | POA: Diagnosis not present

## 2023-03-15 DIAGNOSIS — S81811D Laceration without foreign body, right lower leg, subsequent encounter: Secondary | ICD-10-CM | POA: Diagnosis not present

## 2023-03-15 DIAGNOSIS — K219 Gastro-esophageal reflux disease without esophagitis: Secondary | ICD-10-CM | POA: Diagnosis not present

## 2023-03-15 DIAGNOSIS — I482 Chronic atrial fibrillation, unspecified: Secondary | ICD-10-CM | POA: Diagnosis not present

## 2023-03-18 DIAGNOSIS — I483 Typical atrial flutter: Secondary | ICD-10-CM | POA: Diagnosis not present

## 2023-03-18 DIAGNOSIS — I482 Chronic atrial fibrillation, unspecified: Secondary | ICD-10-CM | POA: Diagnosis not present

## 2023-03-18 DIAGNOSIS — I4891 Unspecified atrial fibrillation: Secondary | ICD-10-CM | POA: Diagnosis not present

## 2023-03-18 DIAGNOSIS — K219 Gastro-esophageal reflux disease without esophagitis: Secondary | ICD-10-CM | POA: Diagnosis not present

## 2023-03-18 DIAGNOSIS — S81811D Laceration without foreign body, right lower leg, subsequent encounter: Secondary | ICD-10-CM | POA: Diagnosis not present

## 2023-03-18 DIAGNOSIS — I495 Sick sinus syndrome: Secondary | ICD-10-CM | POA: Diagnosis not present

## 2023-03-18 DIAGNOSIS — D509 Iron deficiency anemia, unspecified: Secondary | ICD-10-CM | POA: Diagnosis not present

## 2023-03-18 DIAGNOSIS — I10 Essential (primary) hypertension: Secondary | ICD-10-CM | POA: Diagnosis not present

## 2023-03-18 DIAGNOSIS — M810 Age-related osteoporosis without current pathological fracture: Secondary | ICD-10-CM | POA: Diagnosis not present

## 2023-03-18 DIAGNOSIS — F419 Anxiety disorder, unspecified: Secondary | ICD-10-CM | POA: Diagnosis not present

## 2023-03-24 DIAGNOSIS — I495 Sick sinus syndrome: Secondary | ICD-10-CM | POA: Diagnosis not present

## 2023-03-24 DIAGNOSIS — I10 Essential (primary) hypertension: Secondary | ICD-10-CM | POA: Diagnosis not present

## 2023-03-24 DIAGNOSIS — M810 Age-related osteoporosis without current pathological fracture: Secondary | ICD-10-CM | POA: Diagnosis not present

## 2023-03-24 DIAGNOSIS — D509 Iron deficiency anemia, unspecified: Secondary | ICD-10-CM | POA: Diagnosis not present

## 2023-03-24 DIAGNOSIS — K219 Gastro-esophageal reflux disease without esophagitis: Secondary | ICD-10-CM | POA: Diagnosis not present

## 2023-03-24 DIAGNOSIS — F419 Anxiety disorder, unspecified: Secondary | ICD-10-CM | POA: Diagnosis not present

## 2023-03-24 DIAGNOSIS — H353221 Exudative age-related macular degeneration, left eye, with active choroidal neovascularization: Secondary | ICD-10-CM | POA: Diagnosis not present

## 2023-03-24 DIAGNOSIS — I4891 Unspecified atrial fibrillation: Secondary | ICD-10-CM | POA: Diagnosis not present

## 2023-03-24 DIAGNOSIS — I483 Typical atrial flutter: Secondary | ICD-10-CM | POA: Diagnosis not present

## 2023-03-24 DIAGNOSIS — S81811D Laceration without foreign body, right lower leg, subsequent encounter: Secondary | ICD-10-CM | POA: Diagnosis not present

## 2023-03-25 DIAGNOSIS — E785 Hyperlipidemia, unspecified: Secondary | ICD-10-CM | POA: Diagnosis not present

## 2023-03-25 DIAGNOSIS — E559 Vitamin D deficiency, unspecified: Secondary | ICD-10-CM | POA: Diagnosis not present

## 2023-03-25 DIAGNOSIS — Z79899 Other long term (current) drug therapy: Secondary | ICD-10-CM | POA: Diagnosis not present

## 2023-03-26 DIAGNOSIS — D509 Iron deficiency anemia, unspecified: Secondary | ICD-10-CM | POA: Diagnosis not present

## 2023-03-26 DIAGNOSIS — K219 Gastro-esophageal reflux disease without esophagitis: Secondary | ICD-10-CM | POA: Diagnosis not present

## 2023-03-26 DIAGNOSIS — I4891 Unspecified atrial fibrillation: Secondary | ICD-10-CM | POA: Diagnosis not present

## 2023-03-26 DIAGNOSIS — S81811D Laceration without foreign body, right lower leg, subsequent encounter: Secondary | ICD-10-CM | POA: Diagnosis not present

## 2023-03-26 DIAGNOSIS — F419 Anxiety disorder, unspecified: Secondary | ICD-10-CM | POA: Diagnosis not present

## 2023-03-26 DIAGNOSIS — I483 Typical atrial flutter: Secondary | ICD-10-CM | POA: Diagnosis not present

## 2023-03-26 DIAGNOSIS — I495 Sick sinus syndrome: Secondary | ICD-10-CM | POA: Diagnosis not present

## 2023-03-26 DIAGNOSIS — I10 Essential (primary) hypertension: Secondary | ICD-10-CM | POA: Diagnosis not present

## 2023-03-26 DIAGNOSIS — M810 Age-related osteoporosis without current pathological fracture: Secondary | ICD-10-CM | POA: Diagnosis not present

## 2023-03-29 DIAGNOSIS — F419 Anxiety disorder, unspecified: Secondary | ICD-10-CM | POA: Diagnosis not present

## 2023-03-29 DIAGNOSIS — I483 Typical atrial flutter: Secondary | ICD-10-CM | POA: Diagnosis not present

## 2023-03-29 DIAGNOSIS — I495 Sick sinus syndrome: Secondary | ICD-10-CM | POA: Diagnosis not present

## 2023-03-29 DIAGNOSIS — I10 Essential (primary) hypertension: Secondary | ICD-10-CM | POA: Diagnosis not present

## 2023-03-29 DIAGNOSIS — I4891 Unspecified atrial fibrillation: Secondary | ICD-10-CM | POA: Diagnosis not present

## 2023-03-29 DIAGNOSIS — H353114 Nonexudative age-related macular degeneration, right eye, advanced atrophic with subfoveal involvement: Secondary | ICD-10-CM | POA: Diagnosis not present

## 2023-03-29 DIAGNOSIS — K219 Gastro-esophageal reflux disease without esophagitis: Secondary | ICD-10-CM | POA: Diagnosis not present

## 2023-03-29 DIAGNOSIS — D509 Iron deficiency anemia, unspecified: Secondary | ICD-10-CM | POA: Diagnosis not present

## 2023-03-29 DIAGNOSIS — M810 Age-related osteoporosis without current pathological fracture: Secondary | ICD-10-CM | POA: Diagnosis not present

## 2023-03-29 DIAGNOSIS — S81811D Laceration without foreign body, right lower leg, subsequent encounter: Secondary | ICD-10-CM | POA: Diagnosis not present

## 2023-03-31 DIAGNOSIS — I10 Essential (primary) hypertension: Secondary | ICD-10-CM | POA: Diagnosis not present

## 2023-03-31 DIAGNOSIS — I495 Sick sinus syndrome: Secondary | ICD-10-CM | POA: Diagnosis not present

## 2023-03-31 DIAGNOSIS — D509 Iron deficiency anemia, unspecified: Secondary | ICD-10-CM | POA: Diagnosis not present

## 2023-03-31 DIAGNOSIS — F419 Anxiety disorder, unspecified: Secondary | ICD-10-CM | POA: Diagnosis not present

## 2023-03-31 DIAGNOSIS — I483 Typical atrial flutter: Secondary | ICD-10-CM | POA: Diagnosis not present

## 2023-03-31 DIAGNOSIS — K219 Gastro-esophageal reflux disease without esophagitis: Secondary | ICD-10-CM | POA: Diagnosis not present

## 2023-03-31 DIAGNOSIS — S81811D Laceration without foreign body, right lower leg, subsequent encounter: Secondary | ICD-10-CM | POA: Diagnosis not present

## 2023-03-31 DIAGNOSIS — I4891 Unspecified atrial fibrillation: Secondary | ICD-10-CM | POA: Diagnosis not present

## 2023-03-31 DIAGNOSIS — M810 Age-related osteoporosis without current pathological fracture: Secondary | ICD-10-CM | POA: Diagnosis not present

## 2023-04-02 DIAGNOSIS — I10 Essential (primary) hypertension: Secondary | ICD-10-CM | POA: Diagnosis not present

## 2023-04-02 DIAGNOSIS — K219 Gastro-esophageal reflux disease without esophagitis: Secondary | ICD-10-CM | POA: Diagnosis not present

## 2023-04-02 DIAGNOSIS — S81811D Laceration without foreign body, right lower leg, subsequent encounter: Secondary | ICD-10-CM | POA: Diagnosis not present

## 2023-04-02 DIAGNOSIS — F419 Anxiety disorder, unspecified: Secondary | ICD-10-CM | POA: Diagnosis not present

## 2023-04-02 DIAGNOSIS — I4891 Unspecified atrial fibrillation: Secondary | ICD-10-CM | POA: Diagnosis not present

## 2023-04-02 DIAGNOSIS — M810 Age-related osteoporosis without current pathological fracture: Secondary | ICD-10-CM | POA: Diagnosis not present

## 2023-04-02 DIAGNOSIS — I483 Typical atrial flutter: Secondary | ICD-10-CM | POA: Diagnosis not present

## 2023-04-02 DIAGNOSIS — D509 Iron deficiency anemia, unspecified: Secondary | ICD-10-CM | POA: Diagnosis not present

## 2023-04-02 DIAGNOSIS — I495 Sick sinus syndrome: Secondary | ICD-10-CM | POA: Diagnosis not present

## 2023-04-05 DIAGNOSIS — I4891 Unspecified atrial fibrillation: Secondary | ICD-10-CM | POA: Diagnosis not present

## 2023-04-05 DIAGNOSIS — S81811D Laceration without foreign body, right lower leg, subsequent encounter: Secondary | ICD-10-CM | POA: Diagnosis not present

## 2023-04-05 DIAGNOSIS — K219 Gastro-esophageal reflux disease without esophagitis: Secondary | ICD-10-CM | POA: Diagnosis not present

## 2023-04-05 DIAGNOSIS — I495 Sick sinus syndrome: Secondary | ICD-10-CM | POA: Diagnosis not present

## 2023-04-05 DIAGNOSIS — I10 Essential (primary) hypertension: Secondary | ICD-10-CM | POA: Diagnosis not present

## 2023-04-05 DIAGNOSIS — M810 Age-related osteoporosis without current pathological fracture: Secondary | ICD-10-CM | POA: Diagnosis not present

## 2023-04-05 DIAGNOSIS — D509 Iron deficiency anemia, unspecified: Secondary | ICD-10-CM | POA: Diagnosis not present

## 2023-04-05 DIAGNOSIS — F419 Anxiety disorder, unspecified: Secondary | ICD-10-CM | POA: Diagnosis not present

## 2023-04-05 DIAGNOSIS — I483 Typical atrial flutter: Secondary | ICD-10-CM | POA: Diagnosis not present

## 2023-04-14 DIAGNOSIS — I4891 Unspecified atrial fibrillation: Secondary | ICD-10-CM | POA: Diagnosis not present

## 2023-04-14 DIAGNOSIS — K219 Gastro-esophageal reflux disease without esophagitis: Secondary | ICD-10-CM | POA: Diagnosis not present

## 2023-04-14 DIAGNOSIS — I10 Essential (primary) hypertension: Secondary | ICD-10-CM | POA: Diagnosis not present

## 2023-04-14 DIAGNOSIS — D509 Iron deficiency anemia, unspecified: Secondary | ICD-10-CM | POA: Diagnosis not present

## 2023-04-14 DIAGNOSIS — M810 Age-related osteoporosis without current pathological fracture: Secondary | ICD-10-CM | POA: Diagnosis not present

## 2023-04-14 DIAGNOSIS — I495 Sick sinus syndrome: Secondary | ICD-10-CM | POA: Diagnosis not present

## 2023-04-14 DIAGNOSIS — F419 Anxiety disorder, unspecified: Secondary | ICD-10-CM | POA: Diagnosis not present

## 2023-04-14 DIAGNOSIS — I483 Typical atrial flutter: Secondary | ICD-10-CM | POA: Diagnosis not present

## 2023-04-14 DIAGNOSIS — S81811D Laceration without foreign body, right lower leg, subsequent encounter: Secondary | ICD-10-CM | POA: Diagnosis not present

## 2023-04-15 DIAGNOSIS — I10 Essential (primary) hypertension: Secondary | ICD-10-CM | POA: Diagnosis not present

## 2023-04-15 DIAGNOSIS — K219 Gastro-esophageal reflux disease without esophagitis: Secondary | ICD-10-CM | POA: Diagnosis not present

## 2023-04-15 DIAGNOSIS — D509 Iron deficiency anemia, unspecified: Secondary | ICD-10-CM | POA: Diagnosis not present

## 2023-04-15 DIAGNOSIS — I483 Typical atrial flutter: Secondary | ICD-10-CM | POA: Diagnosis not present

## 2023-04-15 DIAGNOSIS — M810 Age-related osteoporosis without current pathological fracture: Secondary | ICD-10-CM | POA: Diagnosis not present

## 2023-04-15 DIAGNOSIS — I4891 Unspecified atrial fibrillation: Secondary | ICD-10-CM | POA: Diagnosis not present

## 2023-04-15 DIAGNOSIS — F419 Anxiety disorder, unspecified: Secondary | ICD-10-CM | POA: Diagnosis not present

## 2023-04-15 DIAGNOSIS — S81811D Laceration without foreign body, right lower leg, subsequent encounter: Secondary | ICD-10-CM | POA: Diagnosis not present

## 2023-04-15 DIAGNOSIS — I495 Sick sinus syndrome: Secondary | ICD-10-CM | POA: Diagnosis not present

## 2023-04-20 DIAGNOSIS — D509 Iron deficiency anemia, unspecified: Secondary | ICD-10-CM | POA: Diagnosis not present

## 2023-04-20 DIAGNOSIS — I495 Sick sinus syndrome: Secondary | ICD-10-CM | POA: Diagnosis not present

## 2023-04-20 DIAGNOSIS — I10 Essential (primary) hypertension: Secondary | ICD-10-CM | POA: Diagnosis not present

## 2023-04-20 DIAGNOSIS — K219 Gastro-esophageal reflux disease without esophagitis: Secondary | ICD-10-CM | POA: Diagnosis not present

## 2023-04-20 DIAGNOSIS — I4891 Unspecified atrial fibrillation: Secondary | ICD-10-CM | POA: Diagnosis not present

## 2023-04-20 DIAGNOSIS — F419 Anxiety disorder, unspecified: Secondary | ICD-10-CM | POA: Diagnosis not present

## 2023-04-20 DIAGNOSIS — I483 Typical atrial flutter: Secondary | ICD-10-CM | POA: Diagnosis not present

## 2023-04-20 DIAGNOSIS — S81811D Laceration without foreign body, right lower leg, subsequent encounter: Secondary | ICD-10-CM | POA: Diagnosis not present

## 2023-04-20 DIAGNOSIS — M810 Age-related osteoporosis without current pathological fracture: Secondary | ICD-10-CM | POA: Diagnosis not present

## 2023-04-26 DIAGNOSIS — I10 Essential (primary) hypertension: Secondary | ICD-10-CM | POA: Diagnosis not present

## 2023-04-26 DIAGNOSIS — I483 Typical atrial flutter: Secondary | ICD-10-CM | POA: Diagnosis not present

## 2023-04-26 DIAGNOSIS — Z7901 Long term (current) use of anticoagulants: Secondary | ICD-10-CM | POA: Diagnosis not present

## 2023-04-26 DIAGNOSIS — K219 Gastro-esophageal reflux disease without esophagitis: Secondary | ICD-10-CM | POA: Diagnosis not present

## 2023-04-28 DIAGNOSIS — H353211 Exudative age-related macular degeneration, right eye, with active choroidal neovascularization: Secondary | ICD-10-CM | POA: Diagnosis not present

## 2023-05-12 DIAGNOSIS — I483 Typical atrial flutter: Secondary | ICD-10-CM | POA: Diagnosis not present

## 2023-05-12 DIAGNOSIS — I10 Essential (primary) hypertension: Secondary | ICD-10-CM | POA: Diagnosis not present

## 2023-05-24 DIAGNOSIS — N182 Chronic kidney disease, stage 2 (mild): Secondary | ICD-10-CM | POA: Diagnosis not present

## 2023-05-24 DIAGNOSIS — Z7901 Long term (current) use of anticoagulants: Secondary | ICD-10-CM | POA: Diagnosis not present

## 2023-05-24 DIAGNOSIS — I483 Typical atrial flutter: Secondary | ICD-10-CM | POA: Diagnosis not present

## 2023-05-24 DIAGNOSIS — I10 Essential (primary) hypertension: Secondary | ICD-10-CM | POA: Diagnosis not present

## 2023-05-31 DIAGNOSIS — H353114 Nonexudative age-related macular degeneration, right eye, advanced atrophic with subfoveal involvement: Secondary | ICD-10-CM | POA: Diagnosis not present

## 2023-06-14 DIAGNOSIS — N182 Chronic kidney disease, stage 2 (mild): Secondary | ICD-10-CM | POA: Diagnosis not present

## 2023-06-14 DIAGNOSIS — F331 Major depressive disorder, recurrent, moderate: Secondary | ICD-10-CM | POA: Diagnosis not present

## 2023-06-14 DIAGNOSIS — F419 Anxiety disorder, unspecified: Secondary | ICD-10-CM | POA: Diagnosis not present

## 2023-06-14 DIAGNOSIS — I483 Typical atrial flutter: Secondary | ICD-10-CM | POA: Diagnosis not present

## 2023-06-16 DIAGNOSIS — N182 Chronic kidney disease, stage 2 (mild): Secondary | ICD-10-CM | POA: Diagnosis not present

## 2023-06-16 DIAGNOSIS — I4891 Unspecified atrial fibrillation: Secondary | ICD-10-CM | POA: Diagnosis not present

## 2023-06-16 DIAGNOSIS — I10 Essential (primary) hypertension: Secondary | ICD-10-CM | POA: Diagnosis not present

## 2023-06-23 DIAGNOSIS — H353221 Exudative age-related macular degeneration, left eye, with active choroidal neovascularization: Secondary | ICD-10-CM | POA: Diagnosis not present

## 2023-06-28 DIAGNOSIS — F419 Anxiety disorder, unspecified: Secondary | ICD-10-CM | POA: Diagnosis not present

## 2023-06-28 DIAGNOSIS — N182 Chronic kidney disease, stage 2 (mild): Secondary | ICD-10-CM | POA: Diagnosis not present

## 2023-06-28 DIAGNOSIS — F331 Major depressive disorder, recurrent, moderate: Secondary | ICD-10-CM | POA: Diagnosis not present

## 2023-06-28 DIAGNOSIS — I1310 Hypertensive heart and chronic kidney disease without heart failure, with stage 1 through stage 4 chronic kidney disease, or unspecified chronic kidney disease: Secondary | ICD-10-CM | POA: Diagnosis not present

## 2023-06-28 DIAGNOSIS — K219 Gastro-esophageal reflux disease without esophagitis: Secondary | ICD-10-CM | POA: Diagnosis not present

## 2023-06-28 DIAGNOSIS — H3530 Unspecified macular degeneration: Secondary | ICD-10-CM | POA: Diagnosis not present

## 2023-06-30 DIAGNOSIS — F331 Major depressive disorder, recurrent, moderate: Secondary | ICD-10-CM | POA: Diagnosis not present

## 2023-06-30 DIAGNOSIS — F419 Anxiety disorder, unspecified: Secondary | ICD-10-CM | POA: Diagnosis not present

## 2023-06-30 DIAGNOSIS — F5101 Primary insomnia: Secondary | ICD-10-CM | POA: Diagnosis not present

## 2023-07-09 DIAGNOSIS — F331 Major depressive disorder, recurrent, moderate: Secondary | ICD-10-CM | POA: Diagnosis not present

## 2023-07-09 DIAGNOSIS — F419 Anxiety disorder, unspecified: Secondary | ICD-10-CM | POA: Diagnosis not present

## 2023-07-26 DIAGNOSIS — I4891 Unspecified atrial fibrillation: Secondary | ICD-10-CM | POA: Diagnosis not present

## 2023-07-26 DIAGNOSIS — N182 Chronic kidney disease, stage 2 (mild): Secondary | ICD-10-CM | POA: Diagnosis not present

## 2023-07-26 DIAGNOSIS — F419 Anxiety disorder, unspecified: Secondary | ICD-10-CM | POA: Diagnosis not present

## 2023-07-26 DIAGNOSIS — I1310 Hypertensive heart and chronic kidney disease without heart failure, with stage 1 through stage 4 chronic kidney disease, or unspecified chronic kidney disease: Secondary | ICD-10-CM | POA: Diagnosis not present

## 2023-07-26 DIAGNOSIS — F331 Major depressive disorder, recurrent, moderate: Secondary | ICD-10-CM | POA: Diagnosis not present

## 2023-07-26 DIAGNOSIS — F5101 Primary insomnia: Secondary | ICD-10-CM | POA: Diagnosis not present

## 2023-07-26 DIAGNOSIS — Z7901 Long term (current) use of anticoagulants: Secondary | ICD-10-CM | POA: Diagnosis not present

## 2023-08-05 ENCOUNTER — Emergency Department (HOSPITAL_COMMUNITY): Payer: Medicare HMO

## 2023-08-05 ENCOUNTER — Other Ambulatory Visit: Payer: Self-pay

## 2023-08-05 ENCOUNTER — Encounter (HOSPITAL_COMMUNITY): Payer: Self-pay

## 2023-08-05 ENCOUNTER — Emergency Department (HOSPITAL_COMMUNITY)
Admission: EM | Admit: 2023-08-05 | Discharge: 2023-08-05 | Disposition: A | Discharge status: Home / Self Care | Payer: Medicare HMO | Attending: Emergency Medicine | Admitting: Emergency Medicine

## 2023-08-05 DIAGNOSIS — Z79899 Other long term (current) drug therapy: Secondary | ICD-10-CM | POA: Insufficient documentation

## 2023-08-05 DIAGNOSIS — B974 Respiratory syncytial virus as the cause of diseases classified elsewhere: Secondary | ICD-10-CM | POA: Insufficient documentation

## 2023-08-05 DIAGNOSIS — Z7901 Long term (current) use of anticoagulants: Secondary | ICD-10-CM | POA: Insufficient documentation

## 2023-08-05 DIAGNOSIS — R0602 Shortness of breath: Secondary | ICD-10-CM | POA: Insufficient documentation

## 2023-08-05 DIAGNOSIS — R918 Other nonspecific abnormal finding of lung field: Secondary | ICD-10-CM | POA: Diagnosis not present

## 2023-08-05 DIAGNOSIS — R059 Cough, unspecified: Secondary | ICD-10-CM | POA: Diagnosis not present

## 2023-08-05 DIAGNOSIS — Z1152 Encounter for screening for COVID-19: Secondary | ICD-10-CM | POA: Insufficient documentation

## 2023-08-05 DIAGNOSIS — I10 Essential (primary) hypertension: Secondary | ICD-10-CM | POA: Insufficient documentation

## 2023-08-05 DIAGNOSIS — R0989 Other specified symptoms and signs involving the circulatory and respiratory systems: Secondary | ICD-10-CM | POA: Diagnosis not present

## 2023-08-05 DIAGNOSIS — B338 Other specified viral diseases: Secondary | ICD-10-CM

## 2023-08-05 LAB — CBC WITH DIFFERENTIAL/PLATELET
Abs Immature Granulocytes: 0.01 10*3/uL (ref 0.00–0.07)
Basophils Absolute: 0 10*3/uL (ref 0.0–0.1)
Basophils Relative: 0 %
Eosinophils Absolute: 0.1 10*3/uL (ref 0.0–0.5)
Eosinophils Relative: 2 %
HCT: 36.6 % (ref 36.0–46.0)
Hemoglobin: 11.7 g/dL — ABNORMAL LOW (ref 12.0–15.0)
Immature Granulocytes: 0 %
Lymphocytes Relative: 9 %
Lymphs Abs: 0.5 10*3/uL — ABNORMAL LOW (ref 0.7–4.0)
MCH: 30.5 pg (ref 26.0–34.0)
MCHC: 32 g/dL (ref 30.0–36.0)
MCV: 95.6 fL (ref 80.0–100.0)
Monocytes Absolute: 0.5 10*3/uL (ref 0.1–1.0)
Monocytes Relative: 10 %
Neutro Abs: 4 10*3/uL (ref 1.7–7.7)
Neutrophils Relative %: 79 %
Platelets: 140 10*3/uL — ABNORMAL LOW (ref 150–400)
RBC: 3.83 MIL/uL — ABNORMAL LOW (ref 3.87–5.11)
RDW: 14.2 % (ref 11.5–15.5)
WBC: 5.1 10*3/uL (ref 4.0–10.5)
nRBC: 0 % (ref 0.0–0.2)

## 2023-08-05 LAB — BASIC METABOLIC PANEL
Anion gap: 11 (ref 5–15)
BUN: 15 mg/dL (ref 8–23)
CO2: 23 mmol/L (ref 22–32)
Calcium: 8.9 mg/dL (ref 8.9–10.3)
Chloride: 103 mmol/L (ref 98–111)
Creatinine, Ser: 0.79 mg/dL (ref 0.44–1.00)
GFR, Estimated: >60 mL/min (ref >60)
Glucose, Bld: 94 mg/dL (ref 70–99)
Potassium: 4.3 mmol/L (ref 3.5–5.1)
Sodium: 137 mmol/L (ref 135–145)

## 2023-08-05 LAB — RESP PANEL BY RT-PCR (RSV, FLU A&B, COVID)  RVPGX2
Influenza A by PCR: NEGATIVE
Influenza B by PCR: NEGATIVE
Resp Syncytial Virus by PCR: POSITIVE — AB
SARS Coronavirus 2 by RT PCR: NEGATIVE

## 2023-08-05 MED ORDER — BENZONATATE 100 MG PO CAPS: 100.0000 mg | ORAL_CAPSULE | Freq: Two times a day (BID) | ORAL | 0 refills | Status: DC | PRN

## 2023-08-05 NOTE — ED Triage Notes (Signed)
BIBA from [facility] assisted [facility] c/o cough/congestion/sore throat x1 day Denies fever,bodyaches, chills.  DNR with patient from facility.  A&O x4. Ambulatory at baseline

## 2023-08-05 NOTE — Discharge Instructions (Addendum)
You presented to the emergency department with a cough and congestion for the past two days. You tested positive for respiratory syncytial virus, which is a common cause of viral pneumonia. You did not have a fever, your oxygen levels were normal, and your heart rate was normal. This illness should resolve on its own with symptomatic treatment. We will prescribe you cough medicine, which you can take as needed to reduce the frequency of coughing.   If you experience sudden worsening of breath, chest pain, or if your cough and symptoms persist beyond two weeks, please return to the emergency department for evaluation. Otherwise, please follow up with your outpatient doctor.

## 2023-08-05 NOTE — ED Provider Notes (Signed)
[place] EMERGENCY DEPARTMENT AT [facility] Provider Note   CSN: [id number] Arrival date & time: 08/05/23  1319     History  Chief Complaint  Patient presents with   Cough    Megan Olsen is a 89 y.o. female with a past medical history of hypertension, atrial fibrillation on Eliquis, and GERD who presents from [facility] facility with concerns of a cough and congestion.  She states that the cough began yesterday and has increased in frequency.  It is a wet cough but has been nonproductive of sputum.  She reports some mild shortness of breath that accompanies her cough.  She denies any fevers, chills, nausea or vomiting, chest pain.  No recent sick contacts.  Home Medications Prior to Admission medications   Medication Sig Start Date End Date Taking? Authorizing Provider  acetaminophen (TYLENOL) 325 MG tablet Take 650 mg by mouth every 8 (eight) hours as needed for moderate pain (pain score 4-6) or mild pain (pain score 1-3).   Yes [provider]  apixaban (ELIQUIS) 2.5 MG TABS tablet TAKE 1 TABLET TWICE DAILY 01/19/22  Yes Duke Salvia, MD  benzonatate (TESSALON) 100 MG capsule Take 1 capsule (100 mg total) by mouth 2 (two) times daily as needed for cough. 08/05/23  Yes Annett Fabian, MD  bisoprolol-hydrochlorothiazide [facility]) 2.5-6.25 MG tablet Take 1 tablet by mouth in the morning and at bedtime.   Yes [provider]  dexlansoprazole (DEXILANT) 60 MG capsule TAKE 1 CAPSULE EVERY DAY 11/12/21  Yes Koberlein, Junell C, MD  fluticasone (FLONASE) 50 MCG/ACT nasal spray PLACE 2 SPRAYS INTO BOTH NOSTRILS DAILY. 09/05/21  Yes Koberlein, Junell C, MD  guaiFENesin (GERI-TUSSIN PO) Take 10 mLs by mouth every 6 (six) hours as needed (cough).   Yes [provider]  guaiFENesin (MUCINEX) 600 MG 12 hr tablet Take 600 mg by mouth every 12 (twelve) hours. For 7 days starting 08/05/23   Yes [provider]  loperamide (IMODIUM A-D) 2 MG tablet  Take 2 mg by mouth every 8 (eight) hours as needed for diarrhea or loose stools.   Yes [provider]  LORazepam (ATIVAN) 1 MG tablet Take 1 mg by mouth at bedtime as needed for anxiety or sleep.   Yes [provider]  losartan (COZAAR) 25 MG tablet Take 1 tablet (25 mg total) by mouth daily. 06/27/21  Yes Koberlein, Paris Lore, MD  Multiple Vitamins-Minerals (PRESERVISION AREDS 2 PO) Take 1 capsule by mouth in the morning and at bedtime.   Yes [provider]  nitroGLYCERIN (NITROSTAT) 0.4 MG SL tablet Place 1 tablet (0.4 mg total) under the tongue every 5 (five) minutes x 3 doses as needed for chest pain. 02/01/19  Yes Rhetta Mura, MD  Simethicone 125 MG CAPS Take 125 mg by mouth every 6 (six) hours as needed (indigestion).   Yes [provider]  traZODone (DESYREL) 100 MG tablet Take 1 tablet (100 mg total) by mouth at bedtime. 06/27/21  Yes Koberlein, Paris Lore, MD  NON FORMULARY Place 1 Dose into both eyes See admin instructions. Receives injections into both eyes every 5-8 weeks at Dr. Eliane Decree office [facility])    [provider]      Allergies    Propoxyphene n-acetaminophen, Celecoxib, Fexofenadine, and Penicillins    Review of Systems   Review of Systems  Constitutional:  Negative for chills and fever.  HENT:  Positive for congestion and rhinorrhea.   Eyes: Negative.   Respiratory:  Positive for cough.   Cardiovascular: Negative.   Gastrointestinal: Negative.   Endocrine: Negative.   Genitourinary: Negative.   Musculoskeletal: Negative.   Skin: Negative.   Allergic/Immunologic: Negative.   Neurological: Negative.   Hematological: Negative.   Psychiatric/Behavioral: Negative.      Physical Exam Updated Vital Signs BP (!) 163/76   Pulse 65   Temp 98 F (36.7 C)   Resp 20   Ht 5\' 5"  (1.651 m)   Wt 54 kg   SpO2 96%   BMI 19.81 kg/m  Physical Exam Constitutional:      Appearance: Normal appearance.   HENT:     Nose: Rhinorrhea present.  Eyes:     Conjunctiva/sclera: Conjunctivae normal.     Pupils: Pupils are equal, round, and reactive to light.  Cardiovascular:     Rate and Rhythm: Normal rate and regular rhythm.     Pulses: Normal pulses.     Heart sounds: Normal heart sounds.  Pulmonary:     Breath sounds: Rhonchi present.     Comments: Mild rhonchi, greater in the right base Abdominal:     General: Abdomen is flat.     Palpations: Abdomen is soft.  Neurological:     General: No focal deficit present.     Mental Status: She is alert and oriented to person, place, and time.     ED Results / Procedures / Treatments   Labs (all labs ordered are listed, but only abnormal results are displayed) Labs Reviewed  RESP PANEL BY RT-PCR (RSV, FLU A&B, COVID)  RVPGX2 - Abnormal; Notable for the following components:      Result Value   Resp Syncytial Virus by PCR POSITIVE (*)    All other components within normal limits  CBC WITH DIFFERENTIAL/PLATELET - Abnormal; Notable for the following components:   RBC 3.83 (*)    Hemoglobin 11.7 (*)    Platelets 140 (*)    Lymphs Abs 0.5 (*)    All other components within normal limits  BASIC METABOLIC PANEL   EKG None  Radiology DG Chest 2 View Result Date: 08/05/2023 CLINICAL DATA:  Cough and congestion. EXAM: CHEST - 2 VIEW COMPARISON:  11/07/2022. FINDINGS: Bilateral lungs appear hyperlucent with coarse bronchovascular markings, concerning for underlying COPD. Bilateral lungs otherwise appear clear. No dense consolidation or lung collapse. Bilateral costophrenic angles are clear. Stable cardio-mediastinal silhouette. Redemonstration of left-sided ICD. No acute osseous abnormalities. The soft tissues are within normal limits. IMPRESSION: No active cardiopulmonary disease.  Probable COPD. Electronically Signed   By: Jules Schick M.D.   On: 08/05/2023 15:55   Medications Ordered in ED Medications - No data to display  ED Course/  Medical Decision Making/ A&P   {Medical Decision Making Amount and/or Complexity of Data Reviewed Radiology: ordered.   This is a 89 year old female who presents with a recent non-productive cough. She is hemodynamically stable and afebrile. Satting 96% on room air. Respiratory viral panel was positive for RSV. CBC largely unremarkable, no leukocytosis, mild normocytic anemia around her baseline. BMP unremarkable, normal electrolytes and kidney function. Chest x-ray personally reviewed, stable cardiomegaly with no focal pulmonary consolidations.   Her cough is likely secondary to her viral respiratory infection, which we expect to self-resolve with symptomatic treatment. Since she is clinically stable and satting well on room air, we will treat her with cough medicine and she can follow up with her outpatient doctor.    Final Clinical Impression(s) / ED Diagnoses Final diagnoses:  RSV (respiratory  syncytial virus infection)   Rx / DC Orders ED Discharge Orders          Ordered    benzonatate (TESSALON) 100 MG capsule  2 times daily PRN        08/05/23 1609             Annett Fabian, MD 08/05/23 1611    Lorre Nick, MD 08/06/23 2329

## 2023-08-05 NOTE — ED Provider Notes (Signed)
I provided a substantive portion of the care of this patient.  I personally made/approved the management plan for this patient and take responsibility for the patient management.      89 year old female who presents with cough congestion since yesterday.  Patient is viral panel is positive for RSV.  She has no evidence of hypoxemia.  Will be discharged home   Lorre Nick, MD 08/05/23 1537

## 2023-08-05 NOTE — ED Provider Triage Note (Signed)
Emergency Medicine Provider Triage Evaluation Note  Megan Olsen , a 89 y.o. female  was evaluated in triage.  Pt complains of cough and congestion since yesterday evening. Had 1 episode of blurry vision. Is ambulatory. On Eliquis.   Review of Systems  Positive: Shortness of breath Negative: Fevers, sore throat, chest pain, abdominal pain, n/v/d, LE swelling or edema, dysuria.   Physical Exam  BP (!) 163/76   Pulse 65   Temp 98 F (36.7 C)   Resp 20   Ht 5\' 5"  (1.651 m)   Wt 54 kg   SpO2 96%   BMI 19.81 kg/m  Gen:   Awake, no distress   Resp:  Normal effort  MSK:   Moves extremities without difficulty  Other:    Medical Decision Making  Medically screening exam initiated at 1:39 PM.  Appropriate orders placed.  Megan Olsen was informed that the remainder of the evaluation will be completed by another provider, this initial triage assessment does not replace that evaluation, and the importance of remaining in the ED until their evaluation is complete.     Lunette Stands, [place] 08/05/23 1343

## 2023-08-12 DIAGNOSIS — I483 Typical atrial flutter: Secondary | ICD-10-CM | POA: Diagnosis not present

## 2023-08-12 DIAGNOSIS — N182 Chronic kidney disease, stage 2 (mild): Secondary | ICD-10-CM | POA: Diagnosis not present

## 2023-08-16 DIAGNOSIS — I1310 Hypertensive heart and chronic kidney disease without heart failure, with stage 1 through stage 4 chronic kidney disease, or unspecified chronic kidney disease: Secondary | ICD-10-CM | POA: Diagnosis not present

## 2023-08-16 DIAGNOSIS — N182 Chronic kidney disease, stage 2 (mild): Secondary | ICD-10-CM | POA: Diagnosis not present

## 2023-08-16 DIAGNOSIS — E860 Dehydration: Secondary | ICD-10-CM | POA: Diagnosis not present

## 2023-08-16 DIAGNOSIS — B974 Respiratory syncytial virus as the cause of diseases classified elsewhere: Secondary | ICD-10-CM | POA: Diagnosis not present

## 2023-08-26 DIAGNOSIS — M6281 Muscle weakness (generalized): Secondary | ICD-10-CM | POA: Diagnosis not present

## 2023-08-26 DIAGNOSIS — Z950 Presence of cardiac pacemaker: Secondary | ICD-10-CM | POA: Diagnosis not present

## 2023-08-26 DIAGNOSIS — N182 Chronic kidney disease, stage 2 (mild): Secondary | ICD-10-CM | POA: Diagnosis not present

## 2023-08-26 DIAGNOSIS — R0989 Other specified symptoms and signs involving the circulatory and respiratory systems: Secondary | ICD-10-CM | POA: Diagnosis not present

## 2023-08-26 DIAGNOSIS — R059 Cough, unspecified: Secondary | ICD-10-CM | POA: Diagnosis not present

## 2023-08-26 DIAGNOSIS — I1310 Hypertensive heart and chronic kidney disease without heart failure, with stage 1 through stage 4 chronic kidney disease, or unspecified chronic kidney disease: Secondary | ICD-10-CM | POA: Diagnosis not present

## 2023-08-26 DIAGNOSIS — I517 Cardiomegaly: Secondary | ICD-10-CM | POA: Diagnosis not present

## 2023-08-29 DIAGNOSIS — F419 Anxiety disorder, unspecified: Secondary | ICD-10-CM | POA: Diagnosis not present

## 2023-08-29 DIAGNOSIS — F331 Major depressive disorder, recurrent, moderate: Secondary | ICD-10-CM | POA: Diagnosis not present

## 2023-08-30 DIAGNOSIS — H353114 Nonexudative age-related macular degeneration, right eye, advanced atrophic with subfoveal involvement: Secondary | ICD-10-CM | POA: Diagnosis not present

## 2023-09-02 ENCOUNTER — Other Ambulatory Visit: Payer: Self-pay | Admitting: Student

## 2023-09-03 NOTE — Telephone Encounter (Signed)
Pt has not been seen by me since 01/2022, please refuse this rx

## 2023-09-06 DIAGNOSIS — F419 Anxiety disorder, unspecified: Secondary | ICD-10-CM | POA: Diagnosis not present

## 2023-09-06 DIAGNOSIS — F331 Major depressive disorder, recurrent, moderate: Secondary | ICD-10-CM | POA: Diagnosis not present

## 2023-09-09 DIAGNOSIS — F419 Anxiety disorder, unspecified: Secondary | ICD-10-CM | POA: Diagnosis not present

## 2023-09-09 DIAGNOSIS — H3530 Unspecified macular degeneration: Secondary | ICD-10-CM | POA: Diagnosis not present

## 2023-09-09 DIAGNOSIS — R238 Other skin changes: Secondary | ICD-10-CM | POA: Diagnosis not present

## 2023-09-09 DIAGNOSIS — R262 Difficulty in walking, not elsewhere classified: Secondary | ICD-10-CM | POA: Diagnosis not present

## 2023-09-09 DIAGNOSIS — I10 Essential (primary) hypertension: Secondary | ICD-10-CM | POA: Diagnosis not present

## 2023-09-09 DIAGNOSIS — R2689 Other abnormalities of gait and mobility: Secondary | ICD-10-CM | POA: Diagnosis not present

## 2023-09-09 DIAGNOSIS — M6281 Muscle weakness (generalized): Secondary | ICD-10-CM | POA: Diagnosis not present

## 2023-09-09 DIAGNOSIS — B351 Tinea unguium: Secondary | ICD-10-CM | POA: Diagnosis not present

## 2023-09-09 DIAGNOSIS — I483 Typical atrial flutter: Secondary | ICD-10-CM | POA: Diagnosis not present

## 2023-09-15 DIAGNOSIS — H353221 Exudative age-related macular degeneration, left eye, with active choroidal neovascularization: Secondary | ICD-10-CM | POA: Diagnosis not present

## 2023-09-20 DIAGNOSIS — F419 Anxiety disorder, unspecified: Secondary | ICD-10-CM | POA: Diagnosis not present

## 2023-09-20 DIAGNOSIS — N182 Chronic kidney disease, stage 2 (mild): Secondary | ICD-10-CM | POA: Diagnosis not present

## 2023-09-20 DIAGNOSIS — I4891 Unspecified atrial fibrillation: Secondary | ICD-10-CM | POA: Diagnosis not present

## 2023-09-20 DIAGNOSIS — F331 Major depressive disorder, recurrent, moderate: Secondary | ICD-10-CM | POA: Diagnosis not present

## 2023-09-20 DIAGNOSIS — I1310 Hypertensive heart and chronic kidney disease without heart failure, with stage 1 through stage 4 chronic kidney disease, or unspecified chronic kidney disease: Secondary | ICD-10-CM | POA: Diagnosis not present

## 2023-09-20 DIAGNOSIS — Z131 Encounter for screening for diabetes mellitus: Secondary | ICD-10-CM | POA: Diagnosis not present

## 2023-09-24 DIAGNOSIS — N182 Chronic kidney disease, stage 2 (mild): Secondary | ICD-10-CM | POA: Diagnosis not present

## 2023-09-24 DIAGNOSIS — I1310 Hypertensive heart and chronic kidney disease without heart failure, with stage 1 through stage 4 chronic kidney disease, or unspecified chronic kidney disease: Secondary | ICD-10-CM | POA: Diagnosis not present

## 2023-09-24 DIAGNOSIS — Z131 Encounter for screening for diabetes mellitus: Secondary | ICD-10-CM | POA: Diagnosis not present

## 2023-09-24 DIAGNOSIS — I4891 Unspecified atrial fibrillation: Secondary | ICD-10-CM | POA: Diagnosis not present

## 2023-10-04 DIAGNOSIS — F331 Major depressive disorder, recurrent, moderate: Secondary | ICD-10-CM | POA: Diagnosis not present

## 2023-10-04 DIAGNOSIS — F419 Anxiety disorder, unspecified: Secondary | ICD-10-CM | POA: Diagnosis not present

## 2023-10-05 DIAGNOSIS — F331 Major depressive disorder, recurrent, moderate: Secondary | ICD-10-CM | POA: Diagnosis not present

## 2023-10-05 DIAGNOSIS — F419 Anxiety disorder, unspecified: Secondary | ICD-10-CM | POA: Diagnosis not present

## 2023-10-14 DIAGNOSIS — F331 Major depressive disorder, recurrent, moderate: Secondary | ICD-10-CM | POA: Diagnosis not present

## 2023-10-14 DIAGNOSIS — F419 Anxiety disorder, unspecified: Secondary | ICD-10-CM | POA: Diagnosis not present

## 2023-10-18 DIAGNOSIS — F03A0 Unspecified dementia, mild, without behavioral disturbance, psychotic disturbance, mood disturbance, and anxiety: Secondary | ICD-10-CM | POA: Diagnosis not present

## 2023-10-18 DIAGNOSIS — I4891 Unspecified atrial fibrillation: Secondary | ICD-10-CM | POA: Diagnosis not present

## 2023-10-18 DIAGNOSIS — N182 Chronic kidney disease, stage 2 (mild): Secondary | ICD-10-CM | POA: Diagnosis not present

## 2023-10-18 DIAGNOSIS — F419 Anxiety disorder, unspecified: Secondary | ICD-10-CM | POA: Diagnosis not present

## 2023-10-18 DIAGNOSIS — I1310 Hypertensive heart and chronic kidney disease without heart failure, with stage 1 through stage 4 chronic kidney disease, or unspecified chronic kidney disease: Secondary | ICD-10-CM | POA: Diagnosis not present

## 2023-10-18 DIAGNOSIS — F331 Major depressive disorder, recurrent, moderate: Secondary | ICD-10-CM | POA: Diagnosis not present

## 2023-10-19 DIAGNOSIS — F330 Major depressive disorder, recurrent, mild: Secondary | ICD-10-CM | POA: Diagnosis not present

## 2023-10-19 DIAGNOSIS — F419 Anxiety disorder, unspecified: Secondary | ICD-10-CM | POA: Diagnosis not present

## 2023-10-25 DIAGNOSIS — H353114 Nonexudative age-related macular degeneration, right eye, advanced atrophic with subfoveal involvement: Secondary | ICD-10-CM | POA: Diagnosis not present

## 2023-11-01 DIAGNOSIS — F331 Major depressive disorder, recurrent, moderate: Secondary | ICD-10-CM | POA: Diagnosis not present

## 2023-11-01 DIAGNOSIS — F419 Anxiety disorder, unspecified: Secondary | ICD-10-CM | POA: Diagnosis not present

## 2023-11-09 DIAGNOSIS — I4891 Unspecified atrial fibrillation: Secondary | ICD-10-CM | POA: Diagnosis not present

## 2023-11-09 DIAGNOSIS — N181 Chronic kidney disease, stage 1: Secondary | ICD-10-CM | POA: Diagnosis not present

## 2023-11-09 DIAGNOSIS — I10 Essential (primary) hypertension: Secondary | ICD-10-CM | POA: Diagnosis not present

## 2023-11-11 DIAGNOSIS — F419 Anxiety disorder, unspecified: Secondary | ICD-10-CM | POA: Diagnosis not present

## 2023-11-11 DIAGNOSIS — R262 Difficulty in walking, not elsewhere classified: Secondary | ICD-10-CM | POA: Diagnosis not present

## 2023-11-11 DIAGNOSIS — I10 Essential (primary) hypertension: Secondary | ICD-10-CM | POA: Diagnosis not present

## 2023-11-11 DIAGNOSIS — R2689 Other abnormalities of gait and mobility: Secondary | ICD-10-CM | POA: Diagnosis not present

## 2023-11-11 DIAGNOSIS — H3530 Unspecified macular degeneration: Secondary | ICD-10-CM | POA: Diagnosis not present

## 2023-11-11 DIAGNOSIS — M6281 Muscle weakness (generalized): Secondary | ICD-10-CM | POA: Diagnosis not present

## 2023-11-11 DIAGNOSIS — I483 Typical atrial flutter: Secondary | ICD-10-CM | POA: Diagnosis not present

## 2023-11-11 DIAGNOSIS — B351 Tinea unguium: Secondary | ICD-10-CM | POA: Diagnosis not present

## 2023-11-11 DIAGNOSIS — R238 Other skin changes: Secondary | ICD-10-CM | POA: Diagnosis not present

## 2023-11-12 DIAGNOSIS — I10 Essential (primary) hypertension: Secondary | ICD-10-CM | POA: Diagnosis not present

## 2023-11-12 DIAGNOSIS — F330 Major depressive disorder, recurrent, mild: Secondary | ICD-10-CM | POA: Diagnosis not present

## 2023-11-15 DIAGNOSIS — K219 Gastro-esophageal reflux disease without esophagitis: Secondary | ICD-10-CM | POA: Diagnosis not present

## 2023-11-15 DIAGNOSIS — F419 Anxiety disorder, unspecified: Secondary | ICD-10-CM | POA: Diagnosis not present

## 2023-11-15 DIAGNOSIS — I10 Essential (primary) hypertension: Secondary | ICD-10-CM | POA: Diagnosis not present

## 2023-11-15 DIAGNOSIS — F331 Major depressive disorder, recurrent, moderate: Secondary | ICD-10-CM | POA: Diagnosis not present

## 2023-11-15 DIAGNOSIS — F03A0 Unspecified dementia, mild, without behavioral disturbance, psychotic disturbance, mood disturbance, and anxiety: Secondary | ICD-10-CM | POA: Diagnosis not present

## 2023-11-23 DIAGNOSIS — F419 Anxiety disorder, unspecified: Secondary | ICD-10-CM | POA: Diagnosis not present

## 2023-11-23 DIAGNOSIS — F330 Major depressive disorder, recurrent, mild: Secondary | ICD-10-CM | POA: Diagnosis not present

## 2023-11-29 DIAGNOSIS — F419 Anxiety disorder, unspecified: Secondary | ICD-10-CM | POA: Diagnosis not present

## 2023-11-29 DIAGNOSIS — F330 Major depressive disorder, recurrent, mild: Secondary | ICD-10-CM | POA: Diagnosis not present

## 2023-12-03 DIAGNOSIS — R4182 Altered mental status, unspecified: Secondary | ICD-10-CM | POA: Diagnosis not present

## 2023-12-05 DIAGNOSIS — R4182 Altered mental status, unspecified: Secondary | ICD-10-CM | POA: Diagnosis not present

## 2023-12-06 DIAGNOSIS — S81801A Unspecified open wound, right lower leg, initial encounter: Secondary | ICD-10-CM | POA: Diagnosis not present

## 2023-12-06 DIAGNOSIS — N182 Chronic kidney disease, stage 2 (mild): Secondary | ICD-10-CM | POA: Diagnosis not present

## 2023-12-06 DIAGNOSIS — I4891 Unspecified atrial fibrillation: Secondary | ICD-10-CM | POA: Diagnosis not present

## 2023-12-06 DIAGNOSIS — I959 Hypotension, unspecified: Secondary | ICD-10-CM | POA: Diagnosis not present

## 2023-12-08 DIAGNOSIS — H353221 Exudative age-related macular degeneration, left eye, with active choroidal neovascularization: Secondary | ICD-10-CM | POA: Diagnosis not present

## 2023-12-09 DIAGNOSIS — L03115 Cellulitis of right lower limb: Secondary | ICD-10-CM | POA: Diagnosis not present

## 2023-12-09 DIAGNOSIS — I959 Hypotension, unspecified: Secondary | ICD-10-CM | POA: Diagnosis not present

## 2023-12-09 DIAGNOSIS — I4892 Unspecified atrial flutter: Secondary | ICD-10-CM | POA: Diagnosis not present

## 2023-12-09 DIAGNOSIS — S81801D Unspecified open wound, right lower leg, subsequent encounter: Secondary | ICD-10-CM | POA: Diagnosis not present

## 2023-12-09 DIAGNOSIS — I4891 Unspecified atrial fibrillation: Secondary | ICD-10-CM | POA: Diagnosis not present

## 2023-12-09 DIAGNOSIS — D509 Iron deficiency anemia, unspecified: Secondary | ICD-10-CM | POA: Diagnosis not present

## 2023-12-09 DIAGNOSIS — N182 Chronic kidney disease, stage 2 (mild): Secondary | ICD-10-CM | POA: Diagnosis not present

## 2023-12-09 DIAGNOSIS — I495 Sick sinus syndrome: Secondary | ICD-10-CM | POA: Diagnosis not present

## 2023-12-09 DIAGNOSIS — I129 Hypertensive chronic kidney disease with stage 1 through stage 4 chronic kidney disease, or unspecified chronic kidney disease: Secondary | ICD-10-CM | POA: Diagnosis not present

## 2023-12-14 ENCOUNTER — Emergency Department (HOSPITAL_COMMUNITY): Admission: EM | Admit: 2023-12-14 | Discharge: 2023-12-14 | Disposition: A | Discharge status: Home / Self Care

## 2023-12-14 ENCOUNTER — Emergency Department (HOSPITAL_COMMUNITY)

## 2023-12-14 DIAGNOSIS — Z96641 Presence of right artificial hip joint: Secondary | ICD-10-CM | POA: Diagnosis not present

## 2023-12-14 DIAGNOSIS — W07XXXA Fall from chair, initial encounter: Secondary | ICD-10-CM | POA: Diagnosis not present

## 2023-12-14 DIAGNOSIS — I959 Hypotension, unspecified: Secondary | ICD-10-CM | POA: Diagnosis not present

## 2023-12-14 DIAGNOSIS — S7012XA Contusion of left thigh, initial encounter: Secondary | ICD-10-CM | POA: Diagnosis not present

## 2023-12-14 DIAGNOSIS — S7002XA Contusion of left hip, initial encounter: Secondary | ICD-10-CM | POA: Insufficient documentation

## 2023-12-14 DIAGNOSIS — Z7901 Long term (current) use of anticoagulants: Secondary | ICD-10-CM | POA: Insufficient documentation

## 2023-12-14 DIAGNOSIS — Z7401 Bed confinement status: Secondary | ICD-10-CM | POA: Diagnosis not present

## 2023-12-14 DIAGNOSIS — M25552 Pain in left hip: Secondary | ICD-10-CM | POA: Diagnosis not present

## 2023-12-14 DIAGNOSIS — W19XXXA Unspecified fall, initial encounter: Secondary | ICD-10-CM

## 2023-12-14 DIAGNOSIS — M1612 Unilateral primary osteoarthritis, left hip: Secondary | ICD-10-CM | POA: Diagnosis not present

## 2023-12-14 DIAGNOSIS — S31809A Unspecified open wound of unspecified buttock, initial encounter: Secondary | ICD-10-CM | POA: Diagnosis not present

## 2023-12-14 MED ORDER — ACETAMINOPHEN 500 MG PO TABS
1000.0000 mg | ORAL_TABLET | Freq: Once | ORAL | Status: AC
  Administered 2023-12-14: 1000 mg via ORAL
  Filled 2023-12-14: qty 2

## 2023-12-14 NOTE — ED Provider Notes (Signed)
 [place] EMERGENCY DEPARTMENT AT [facility] Provider Note   CSN: [id number] Arrival date & time: 12/14/23  2042     History  Chief Complaint  Patient presents with   Megan Olsen    Megan Olsen is a 89 y.o. female.  This is a 89 year old female that fell on Sunday.  Slid out of chair onto her left backside.  Did not hit her head no LOC or injury.  Is on Eliquis .  She is alert and oriented x 3.  Not on blood thinners.  Not having a headache, no chest pain no shortness of breath no abdominal pain.  No numbness tingling changes in sensation.  Reports that did not have much pain after the fall, but has developed pain to her left buttock.   Fall       Home Medications Prior to Admission medications   Medication Sig Start Date End Date Taking? Authorizing Provider  acetaminophen  (TYLENOL ) 325 MG tablet Take 650 mg by mouth every 8 (eight) hours as needed for moderate pain (pain score 4-6) or mild pain (pain score 1-3).    [provider]  apixaban  (ELIQUIS ) 2.5 MG TABS tablet TAKE 1 TABLET TWICE DAILY 01/19/22   Verona Goodwill, MD  benzonatate  (TESSALON ) 100 MG capsule Take 1 capsule (100 mg total) by mouth 2 (two) times daily as needed for cough. 08/05/23   Dorthy Gavia, MD  bisoprolol -hydrochlorothiazide  (ZIAC ) 2.5-6.25 MG tablet Take 1 tablet by mouth in the morning and at bedtime.    [provider]  dexlansoprazole  (DEXILANT ) 60 MG capsule TAKE 1 CAPSULE EVERY DAY 11/12/21   Koberlein, Junell C, MD  fluticasone  (FLONASE ) 50 MCG/ACT nasal spray PLACE 2 SPRAYS INTO BOTH NOSTRILS DAILY. 09/05/21   Koberlein, Junell C, MD  guaiFENesin (GERI-TUSSIN PO) Take 10 mLs by mouth every 6 (six) hours as needed (cough).    [provider]  guaiFENesin (MUCINEX) 600 MG 12 hr tablet Take 600 mg by mouth every 12 (twelve) hours. For 7 days starting 08/05/23    [provider]  loperamide (IMODIUM A-D) 2 MG tablet Take 2 mg by mouth every 8 (eight) hours  as needed for diarrhea or loose stools.    [provider]  LORazepam  (ATIVAN ) 1 MG tablet Take 1 mg by mouth at bedtime as needed for anxiety or sleep.    [provider]  losartan  (COZAAR ) 25 MG tablet Take 1 tablet (25 mg total) by mouth daily. 06/27/21   Koberlein, Junell C, MD  Multiple Vitamins-Minerals (PRESERVISION AREDS 2 PO) Take 1 capsule by mouth in the morning and at bedtime.    [provider]  nitroGLYCERIN  (NITROSTAT ) 0.4 MG SL tablet Place 1 tablet (0.4 mg total) under the tongue every 5 (five) minutes x 3 doses as needed for chest pain. 02/01/19   Samtani, Jai-Gurmukh, MD  NON FORMULARY Place 1 Dose into both eyes See admin instructions. Receives injections into both eyes every 5-8 weeks at Dr. Basilio Both office [facility])    [provider]  Simethicone  125 MG CAPS Take 125 mg by mouth every 6 (six) hours as needed (indigestion).    [provider]  traZODone  (DESYREL ) 100 MG tablet Take 1 tablet (100 mg total) by mouth at bedtime. 06/27/21   Koberlein, Junell C, MD      Allergies    Propoxyphene n-acetaminophen , Celecoxib, Fexofenadine, and Penicillins    Review of Systems   Review of Systems  Physical Exam Updated Vital Signs BP Aaron Aas)  147/52 (BP Location: Right Arm)   Pulse 73   Temp 97.7 F (36.5 C) (Oral)   Resp 18   SpO2 98%  Physical Exam Vitals and nursing note reviewed.  Constitutional:      General: She is not in acute distress.    Appearance: She is not ill-appearing or toxic-appearing.  HENT:     Nose: Nose normal.     Mouth/Throat:     Mouth: Mucous membranes are moist.  Eyes:     Extraocular Movements: Extraocular movements intact.     Pupils: Pupils are equal, round, and reactive to light.  Cardiovascular:     Rate and Rhythm: Normal rate and regular rhythm.  Pulmonary:     Effort: Pulmonary effort is normal.     Breath sounds: Normal breath sounds.  Abdominal:     General: Abdomen is flat.  There is no distension.     Palpations: Abdomen is soft.     Tenderness: There is no abdominal tenderness. There is no guarding or rebound.  Musculoskeletal:     Comments: Patient has some bruising to her posterior thigh and buttock.  No palpable hematoma.  No pain to palpation to the chest wall, or pelvis.  Equal grip strength bilaterally.  5 out of 5 plantarflexion dorsiflexion bilaterally.  2+ DP pulses bilaterally.  Skin:    General: Skin is warm and dry.     Capillary Refill: Capillary refill takes less than 2 seconds.     Findings: Bruising present.  Neurological:     Mental Status: She is alert and oriented to person, place, and time.  Psychiatric:        Mood and Affect: Mood normal.        Behavior: Behavior normal.     ED Results / Procedures / Treatments   Labs (all labs ordered are listed, but only abnormal results are displayed) Labs Reviewed - No data to display  EKG None  Radiology DG Hip Unilat W or Wo Pelvis 2-3 Views Left Result Date: 12/14/2023 CLINICAL DATA:  Fall with pain to the left hip EXAM: DG HIP (WITH OR WITHOUT PELVIS) 2-3V LEFT COMPARISON:  03/13/2022 FINDINGS: SI joints are non widened. Pubic symphysis and rami appear intact. Extensive vascular calcifications. Right hip replacement with normal alignment. Screw fixation of proximal left femur also stable. No definite acute displaced fracture or malalignment. Mild left hip degenerative change IMPRESSION: 1. No definite acute osseous abnormality. 2. Right hip replacement. Screw fixation of proximal left femur. Electronically Signed   By: Esmeralda Hedge M.D.   On: 12/14/2023 21:15    Procedures Procedures    Medications Ordered in ED Medications  acetaminophen  (TYLENOL ) tablet 1,000 mg (has no administration in time range)    ED Course/ Medical Decision Making/ A&P Clinical Course as of 12/14/23 2153  Tue Dec 14, 2023  2130 DG Hip Unilat W or Wo Pelvis 2-3 Views Left IMPRESSION: 1. No definite  acute osseous abnormality. 2. Right hip replacement. Screw fixation of proximal left femur.   Electronically Signed   By: Esmeralda Hedge M.D.   [TY]    Clinical Course User Index [TY] Rolinda Climes, DO                                 Medical Decision Making 89 year old presented to the emergency department with buttock pain after a fall on Sunday.  She is afebrile nontachycardic, slightly hypertensive.  She is alert and orient x 3.  Nonlocalizing exam.  Symptoms seemingly delayed onset after the fall and has bruising to her buttock.  Has brisk pulses distally.  It is minor bruising, do not appreciate palpable hematoma.  She ran out of Tylenol  at facility.  I suspect pain secondary to contusion/bruise rather than osseous pathology.  However given her advanced age x-ray obtained and it was negative for fracture.  Patient reports that she is able to ambulate, although slightly painful.  EMS also notes the patient was able to ambulate to stretcher.  Will discharge in stable condition.  Amount and/or Complexity of Data Reviewed Independent Historian: EMS External Data Reviewed:     Details: Is on Eliquis  per chart review Radiology: ordered. Decision-making details documented in ED Course.    Details: Considered CT head, however fall was Sunday did not hit her head.  Has no headache and has a nonlocalizing exam.  Low suspicion for acute intracranial hemorrhage/trauma.  Will forego scan at this time.  Risk OTC drugs. Decision regarding hospitalization. Diagnosis or treatment significantly limited by social determinants of health. Risk Details: Lives in facility.          Final Clinical Impression(s) / ED Diagnoses Final diagnoses:  None    Rx / DC Orders ED Discharge Orders     None         Rolinda Climes, DO 12/14/23 2153

## 2023-12-14 NOTE — Discharge Instructions (Signed)
 You may take over-the-counter Tylenol  for pain.  Please follow-up with your primary doctor.  Return if you develop numbness, tingling, leg becomes blue, cold, pale, or if it becomes swollen compared to the other leg.  You may also return if develop any new or worsening symptoms that are concerning to you.

## 2023-12-14 NOTE — ED Triage Notes (Signed)
 BIB [facility] from [facility], fell Sunday. "Knot" on left side. No LOC or head injury. A&O x4 , ambulatory 128/60 85HR 18 RR 96 on RA  CBG 118

## 2023-12-16 DIAGNOSIS — L03115 Cellulitis of right lower limb: Secondary | ICD-10-CM | POA: Diagnosis not present

## 2023-12-16 DIAGNOSIS — I4891 Unspecified atrial fibrillation: Secondary | ICD-10-CM | POA: Diagnosis not present

## 2023-12-16 DIAGNOSIS — S81801D Unspecified open wound, right lower leg, subsequent encounter: Secondary | ICD-10-CM | POA: Diagnosis not present

## 2023-12-16 DIAGNOSIS — I495 Sick sinus syndrome: Secondary | ICD-10-CM | POA: Diagnosis not present

## 2023-12-16 DIAGNOSIS — I4892 Unspecified atrial flutter: Secondary | ICD-10-CM | POA: Diagnosis not present

## 2023-12-16 DIAGNOSIS — D509 Iron deficiency anemia, unspecified: Secondary | ICD-10-CM | POA: Diagnosis not present

## 2023-12-16 DIAGNOSIS — I129 Hypertensive chronic kidney disease with stage 1 through stage 4 chronic kidney disease, or unspecified chronic kidney disease: Secondary | ICD-10-CM | POA: Diagnosis not present

## 2023-12-16 DIAGNOSIS — I959 Hypotension, unspecified: Secondary | ICD-10-CM | POA: Diagnosis not present

## 2023-12-16 DIAGNOSIS — N182 Chronic kidney disease, stage 2 (mild): Secondary | ICD-10-CM | POA: Diagnosis not present

## 2023-12-17 DIAGNOSIS — L03115 Cellulitis of right lower limb: Secondary | ICD-10-CM | POA: Diagnosis not present

## 2023-12-17 DIAGNOSIS — I4892 Unspecified atrial flutter: Secondary | ICD-10-CM | POA: Diagnosis not present

## 2023-12-17 DIAGNOSIS — I959 Hypotension, unspecified: Secondary | ICD-10-CM | POA: Diagnosis not present

## 2023-12-17 DIAGNOSIS — I495 Sick sinus syndrome: Secondary | ICD-10-CM | POA: Diagnosis not present

## 2023-12-17 DIAGNOSIS — I509 Heart failure, unspecified: Secondary | ICD-10-CM | POA: Diagnosis not present

## 2023-12-17 DIAGNOSIS — I129 Hypertensive chronic kidney disease with stage 1 through stage 4 chronic kidney disease, or unspecified chronic kidney disease: Secondary | ICD-10-CM | POA: Diagnosis not present

## 2023-12-17 DIAGNOSIS — I4891 Unspecified atrial fibrillation: Secondary | ICD-10-CM | POA: Diagnosis not present

## 2023-12-17 DIAGNOSIS — D509 Iron deficiency anemia, unspecified: Secondary | ICD-10-CM | POA: Diagnosis not present

## 2023-12-17 DIAGNOSIS — N182 Chronic kidney disease, stage 2 (mild): Secondary | ICD-10-CM | POA: Diagnosis not present

## 2023-12-17 DIAGNOSIS — D519 Vitamin B12 deficiency anemia, unspecified: Secondary | ICD-10-CM | POA: Diagnosis not present

## 2023-12-17 DIAGNOSIS — S81801D Unspecified open wound, right lower leg, subsequent encounter: Secondary | ICD-10-CM | POA: Diagnosis not present

## 2023-12-18 ENCOUNTER — Observation Stay (HOSPITAL_COMMUNITY)
Admission: EM | Admit: 2023-12-18 | Discharge: 2023-12-20 | Disposition: A | Discharge status: Home / Self Care | Attending: Student | Admitting: Student

## 2023-12-18 ENCOUNTER — Encounter (HOSPITAL_COMMUNITY): Payer: Self-pay | Admitting: *Deleted

## 2023-12-18 ENCOUNTER — Emergency Department (HOSPITAL_COMMUNITY)

## 2023-12-18 ENCOUNTER — Other Ambulatory Visit: Payer: Self-pay

## 2023-12-18 DIAGNOSIS — S22080A Wedge compression fracture of T11-T12 vertebra, initial encounter for closed fracture: Secondary | ICD-10-CM | POA: Insufficient documentation

## 2023-12-18 DIAGNOSIS — M25512 Pain in left shoulder: Secondary | ICD-10-CM | POA: Diagnosis present

## 2023-12-18 DIAGNOSIS — Z8673 Personal history of transient ischemic attack (TIA), and cerebral infarction without residual deficits: Secondary | ICD-10-CM | POA: Insufficient documentation

## 2023-12-18 DIAGNOSIS — R627 Adult failure to thrive: Secondary | ICD-10-CM | POA: Insufficient documentation

## 2023-12-18 DIAGNOSIS — W19XXXA Unspecified fall, initial encounter: Secondary | ICD-10-CM | POA: Diagnosis not present

## 2023-12-18 DIAGNOSIS — I10 Essential (primary) hypertension: Secondary | ICD-10-CM | POA: Diagnosis not present

## 2023-12-18 DIAGNOSIS — Z7901 Long term (current) use of anticoagulants: Secondary | ICD-10-CM | POA: Insufficient documentation

## 2023-12-18 DIAGNOSIS — I340 Nonrheumatic mitral (valve) insufficiency: Secondary | ICD-10-CM | POA: Insufficient documentation

## 2023-12-18 DIAGNOSIS — I071 Rheumatic tricuspid insufficiency: Secondary | ICD-10-CM | POA: Insufficient documentation

## 2023-12-18 DIAGNOSIS — I495 Sick sinus syndrome: Secondary | ICD-10-CM | POA: Diagnosis present

## 2023-12-18 DIAGNOSIS — K7689 Other specified diseases of liver: Secondary | ICD-10-CM | POA: Diagnosis not present

## 2023-12-18 DIAGNOSIS — E039 Hypothyroidism, unspecified: Secondary | ICD-10-CM | POA: Insufficient documentation

## 2023-12-18 DIAGNOSIS — I951 Orthostatic hypotension: Secondary | ICD-10-CM | POA: Insufficient documentation

## 2023-12-18 DIAGNOSIS — I350 Nonrheumatic aortic (valve) stenosis: Secondary | ICD-10-CM

## 2023-12-18 DIAGNOSIS — D649 Anemia, unspecified: Secondary | ICD-10-CM | POA: Diagnosis not present

## 2023-12-18 DIAGNOSIS — Z950 Presence of cardiac pacemaker: Secondary | ICD-10-CM | POA: Insufficient documentation

## 2023-12-18 DIAGNOSIS — S0990XA Unspecified injury of head, initial encounter: Secondary | ICD-10-CM | POA: Diagnosis not present

## 2023-12-18 DIAGNOSIS — M19012 Primary osteoarthritis, left shoulder: Secondary | ICD-10-CM | POA: Diagnosis not present

## 2023-12-18 DIAGNOSIS — M810 Age-related osteoporosis without current pathological fracture: Secondary | ICD-10-CM | POA: Diagnosis not present

## 2023-12-18 DIAGNOSIS — E538 Deficiency of other specified B group vitamins: Secondary | ICD-10-CM | POA: Diagnosis present

## 2023-12-18 DIAGNOSIS — R770 Abnormality of albumin: Secondary | ICD-10-CM | POA: Insufficient documentation

## 2023-12-18 DIAGNOSIS — K8689 Other specified diseases of pancreas: Secondary | ICD-10-CM | POA: Diagnosis not present

## 2023-12-18 DIAGNOSIS — R42 Dizziness and giddiness: Secondary | ICD-10-CM | POA: Diagnosis not present

## 2023-12-18 DIAGNOSIS — Z96649 Presence of unspecified artificial hip joint: Secondary | ICD-10-CM

## 2023-12-18 DIAGNOSIS — M799 Soft tissue disorder, unspecified: Secondary | ICD-10-CM | POA: Diagnosis not present

## 2023-12-18 DIAGNOSIS — I4821 Permanent atrial fibrillation: Secondary | ICD-10-CM

## 2023-12-18 DIAGNOSIS — S3991XA Unspecified injury of abdomen, initial encounter: Secondary | ICD-10-CM | POA: Diagnosis not present

## 2023-12-18 DIAGNOSIS — S32010A Wedge compression fracture of first lumbar vertebra, initial encounter for closed fracture: Secondary | ICD-10-CM | POA: Diagnosis not present

## 2023-12-18 DIAGNOSIS — I959 Hypotension, unspecified: Secondary | ICD-10-CM | POA: Diagnosis not present

## 2023-12-18 DIAGNOSIS — M1990 Unspecified osteoarthritis, unspecified site: Secondary | ICD-10-CM | POA: Diagnosis present

## 2023-12-18 DIAGNOSIS — R600 Localized edema: Secondary | ICD-10-CM | POA: Diagnosis not present

## 2023-12-18 DIAGNOSIS — S300XXA Contusion of lower back and pelvis, initial encounter: Secondary | ICD-10-CM | POA: Diagnosis not present

## 2023-12-18 DIAGNOSIS — I4891 Unspecified atrial fibrillation: Secondary | ICD-10-CM | POA: Insufficient documentation

## 2023-12-18 DIAGNOSIS — Z043 Encounter for examination and observation following other accident: Secondary | ICD-10-CM | POA: Diagnosis not present

## 2023-12-18 DIAGNOSIS — Z79899 Other long term (current) drug therapy: Secondary | ICD-10-CM | POA: Diagnosis not present

## 2023-12-18 DIAGNOSIS — I5032 Chronic diastolic (congestive) heart failure: Secondary | ICD-10-CM | POA: Insufficient documentation

## 2023-12-18 DIAGNOSIS — Z96641 Presence of right artificial hip joint: Secondary | ICD-10-CM | POA: Diagnosis not present

## 2023-12-18 DIAGNOSIS — S8012XA Contusion of left lower leg, initial encounter: Secondary | ICD-10-CM | POA: Diagnosis not present

## 2023-12-18 DIAGNOSIS — R531 Weakness: Secondary | ICD-10-CM

## 2023-12-18 DIAGNOSIS — I672 Cerebral atherosclerosis: Secondary | ICD-10-CM | POA: Diagnosis not present

## 2023-12-18 LAB — COMPREHENSIVE METABOLIC PANEL WITH GFR
ALT: 14 U/L (ref 0–44)
AST: 21 U/L (ref 15–41)
Albumin: 2.5 g/dL — ABNORMAL LOW (ref 3.5–5.0)
Alkaline Phosphatase: 111 U/L (ref 38–126)
Anion gap: 8 (ref 5–15)
BUN: 16 mg/dL (ref 8–23)
CO2: 25 mmol/L (ref 22–32)
Calcium: 8.3 mg/dL — ABNORMAL LOW (ref 8.9–10.3)
Chloride: 99 mmol/L (ref 98–111)
Creatinine, Ser: 0.93 mg/dL (ref 0.44–1.00)
GFR, Estimated: 57 mL/min — ABNORMAL LOW
Glucose, Bld: 93 mg/dL (ref 70–99)
Potassium: 4.3 mmol/L (ref 3.5–5.1)
Sodium: 132 mmol/L — ABNORMAL LOW (ref 135–145)
Total Bilirubin: 1 mg/dL (ref 0.0–1.2)
Total Protein: 5.4 g/dL — ABNORMAL LOW (ref 6.5–8.1)

## 2023-12-18 LAB — CBC
HCT: 26 % — ABNORMAL LOW (ref 36.0–46.0)
Hemoglobin: 8.5 g/dL — ABNORMAL LOW (ref 12.0–15.0)
MCH: 31 pg (ref 26.0–34.0)
MCHC: 32.7 g/dL (ref 30.0–36.0)
MCV: 94.9 fL (ref 80.0–100.0)
Platelets: 219 10*3/uL (ref 150–400)
RBC: 2.74 MIL/uL — ABNORMAL LOW (ref 3.87–5.11)
RDW: 13.6 % (ref 11.5–15.5)
WBC: 5 10*3/uL (ref 4.0–10.5)
nRBC: 0 % (ref 0.0–0.2)

## 2023-12-18 LAB — TYPE AND SCREEN
ABO/RH(D): A POS
Antibody Screen: NEGATIVE

## 2023-12-18 LAB — TSH: TSH: 3.192 u[IU]/mL (ref 0.350–4.500)

## 2023-12-18 LAB — VITAMIN D 25 HYDROXY (VIT D DEFICIENCY, FRACTURES): Vit D, 25-Hydroxy: 58.37 ng/mL (ref 30–100)

## 2023-12-18 LAB — POC OCCULT BLOOD, ED: Fecal Occult Bld: NEGATIVE

## 2023-12-18 MED ORDER — BISOPROLOL-HYDROCHLOROTHIAZIDE 2.5-6.25 MG PO TABS: 1.0000 | ORAL_TABLET | Freq: Every day | ORAL | Status: DC

## 2023-12-18 MED ORDER — PANTOPRAZOLE SODIUM 40 MG PO TBEC
40.0000 mg | DELAYED_RELEASE_TABLET | Freq: Every day | ORAL | Status: DC
  Administered 2023-12-18 – 2023-12-20 (×3): 40 mg via ORAL
  Filled 2023-12-18 (×3): qty 1

## 2023-12-18 MED ORDER — ACETAMINOPHEN 325 MG PO TABS
650.0000 mg | ORAL_TABLET | Freq: Three times a day (TID) | ORAL | Status: DC | PRN
  Administered 2023-12-18 – 2023-12-20 (×3): 650 mg via ORAL
  Filled 2023-12-18 (×3): qty 2

## 2023-12-18 MED ORDER — BISOPROLOL FUMARATE 5 MG PO TABS
2.5000 mg | ORAL_TABLET | Freq: Every day | ORAL | Status: DC
  Filled 2023-12-18: qty 0.5

## 2023-12-18 MED ORDER — APIXABAN 2.5 MG PO TABS
2.5000 mg | ORAL_TABLET | Freq: Two times a day (BID) | ORAL | Status: DC
  Administered 2023-12-18 (×2): 2.5 mg via ORAL
  Filled 2023-12-18 (×3): qty 1

## 2023-12-18 MED ORDER — RAMELTEON 8 MG PO TABS
8.0000 mg | ORAL_TABLET | Freq: Every day | ORAL | Status: DC
  Administered 2023-12-18 – 2023-12-19 (×2): 8 mg via ORAL
  Filled 2023-12-18 (×3): qty 1

## 2023-12-18 MED ORDER — LOSARTAN POTASSIUM 25 MG PO TABS
25.0000 mg | ORAL_TABLET | Freq: Every day | ORAL | Status: DC
  Administered 2023-12-18: 25 mg via ORAL
  Filled 2023-12-18 (×2): qty 1

## 2023-12-18 MED ORDER — GUAIFENESIN ER 600 MG PO TB12
600.0000 mg | ORAL_TABLET | Freq: Two times a day (BID) | ORAL | Status: DC
  Administered 2023-12-18 – 2023-12-20 (×5): 600 mg via ORAL
  Filled 2023-12-18 (×5): qty 1

## 2023-12-18 MED ORDER — MEDIHONEY WOUND/BURN DRESSING EX PSTE
1.0000 | PASTE | Freq: Every day | CUTANEOUS | Status: DC
  Administered 2023-12-18 – 2023-12-20 (×3): 1 via TOPICAL
  Filled 2023-12-18 (×2): qty 44

## 2023-12-18 MED ORDER — IOHEXOL 350 MG/ML SOLN
75.0000 mL | Freq: Once | INTRAVENOUS | Status: AC | PRN
  Administered 2023-12-18: 75 mL via INTRAVENOUS

## 2023-12-18 MED ORDER — VITAMIN D 25 MCG (1000 UNIT) PO TABS
2000.0000 [IU] | ORAL_TABLET | Freq: Every day | ORAL | Status: DC
  Administered 2023-12-18 – 2023-12-20 (×3): 2000 [IU] via ORAL
  Filled 2023-12-18 (×3): qty 2

## 2023-12-18 MED ORDER — HYDROCHLOROTHIAZIDE 12.5 MG PO TABS
6.2500 mg | ORAL_TABLET | Freq: Every day | ORAL | Status: DC
  Filled 2023-12-18: qty 1

## 2023-12-18 NOTE — Plan of Care (Addendum)
 Photos taken of wounds presents upon admission right lower leg and right arm. Wound consult placed.   Problem: Education: Goal: Knowledge of General Education information will improve Description: Including pain rating scale, medication(s)/side effects and non-pharmacologic comfort measures Outcome: Progressing   Problem: Health Behavior/Discharge Planning: Goal: Ability to manage health-related needs will improve Outcome: Progressing   Problem: Clinical Measurements: Goal: Ability to maintain clinical measurements within normal limits will improve Outcome: Progressing Goal: Will remain free from infection Outcome: Progressing Goal: Diagnostic test results will improve Outcome: Progressing Goal: Respiratory complications will improve Outcome: Progressing Goal: Cardiovascular complication will be avoided Outcome: Progressing

## 2023-12-18 NOTE — ED Notes (Signed)
 Patient transported to CT

## 2023-12-18 NOTE — ED Triage Notes (Signed)
 Pt arrives via [facility] from [facility]. Pt told EMS she was getting up to the bathroom, felt like she was falling back, fell onto her buttocks. C/o pain in the left buttocks and left shoulder.Did not hit her head, is on blood thinners, no neck pain. Denied feeling dizzy prior to fall.  Reported this was the 3rd time she has fallen in a month. EMS got her up to go to the bathroom, she was c/o Dizziness when the got her up to go to the bathroom, assisted ambulation to restroom, BP, 94/56, hr 62. Cbg 112. 97.6, 95% ra, pacemaker

## 2023-12-18 NOTE — H&P (Signed)
 History and Physical    Patient: Megan Olsen UJW:[id number] DOB: [birth year] DOA: 12/18/2023 DOS: the patient was seen and examined on 12/18/2023 PCP: Aida House, MD  Patient coming from: Home  Chief Complaint:  Chief Complaint  Patient presents with   Fall   HPI: Megan Olsen is a 89 y.o. female with medical history significant of Afib, s/p PPM, HTN, HLD, CVA, hypothyroidism, GERD, osteoporosis, and IDA p/w GLF c/b R buttock hematoma and symptomatic anemia.  Pt was in her USOH until she fell on her butt this morning. Pt reports being in the shower and reaching for her roommates towel that was hanging over the shower rod. When she pulled the towel down, she fell back onto a stool behind her, and landed on her buttocks very hard. She denies any history of falls despite taking ativan . She denies LOC or head strike.  In the ED, pt bradyardic and hypotensive (SBP 100s). Labs notable for Na 132, Cr 0.93, Hb 8.5 (down from baseline 10-12). FOBT neg. CTH NAICA. CT abd/pelvis W showed T11/L1 endplate fractures and pulmonary lingular bronchiectasis (previously seen since 2019). Pt admitted to medicine for close observation given advanced age and PT/OT eval.  Review of Systems: As mentioned in the history of present illness. All other systems reviewed and are negative. Past Medical History:  Diagnosis Date   Age-related macular degeneration, wet, both eyes (HCC)    Anxiety disorder 07/06/2014   Arthritis    "all over" (04/27/2018)   Atrial fibrillation (HCC)    catheter ablation of SVT in 2002   Benign essential HTN 07/06/2014   Constipation, intermittent 08/04/2007   CVA (cerebrovascular accident) (HCC) 2003   left brain; denies residual on 04/27/2018   Depression    GERD (gastroesophageal reflux disease)    Heart murmur    hx (04/27/2018)   Hypothyroidism 09/07/2014   "off RX now" (04/27/2018)   Iron deficiency anemia    Iron deficiency anemia, unspecified 01/02/2013    Osteoarthritis    Osteoporosis 01/11/2007   Pneumonia    "one time; years and years ago" (04/27/2018)   Presence of permanent cardiac pacemaker 04/27/2018    Dual Chamber   Right BBB/left ant fasc block    Past Surgical History:  Procedure Laterality Date   APPENDECTOMY     CATARACT EXTRACTION W/ INTRAOCULAR LENS  IMPLANT, BILATERAL Bilateral    CHOLECYSTECTOMY OPEN     COLONOSCOPY     EYE SURGERY     FRACTURE SURGERY     HIP PINNING,CANNULATED Left 07/07/2014   Procedure: CANNULATED HIP PINNING;  Surgeon: Bevin Bucks, MD;  Location: [facility];  Service: Orthopedics;  Laterality: Left;   INSERT / REPLACE / REMOVE PACEMAKER  04/27/2018    Dual Chamber   JOINT REPLACEMENT     LAPAROSCOPIC OVARIAN CYSTECTOMY     LEFT HEART CATH AND CORONARY ANGIOGRAPHY N/A 01/31/2019   Procedure: LEFT HEART CATH AND CORONARY ANGIOGRAPHY;  Surgeon: Arleen Lacer, MD;  Location: [facility] INVASIVE CV LAB;  Service: Cardiovascular;  Laterality: N/A;   NASAL SEPTUM SURGERY     PACEMAKER IMPLANT N/A 04/27/2018   Procedure: PACEMAKER IMPLANT - Dual Chamber;  Surgeon: Verona Goodwill, MD;  Location: [facility] INVASIVE CV LAB;  Service: Cardiovascular;  Laterality: N/A;   SVT ABLATION  2002   TONSILLECTOMY     TOTAL ABDOMINAL HYSTERECTOMY     TOTAL HIP ARTHROPLASTY Right 02/23/2017   Procedure: RIGHT TOTAL HIP ARTHROPLASTY ANTERIOR APPROACH;  Surgeon: Bernard Brick,  Zoila Hines, MD;  Location: [facility];  Service: Orthopedics;  Laterality: Right;   VITRECTOMY Right 2008   dr Seward Dao.  Vitrectomy and removal of tissue.    Social History:  reports that she has never smoked. She has never been exposed to tobacco smoke. She has never used smokeless tobacco. She reports that she does not currently use alcohol. She reports that she does not use drugs.  Allergies  Allergen Reactions   Darvon [Propoxyphene] Nausea Only   Propoxyphene N-Acetaminophen  Nausea Only   Allegra [Fexofenadine] Rash   Celebrex [Celecoxib] Itching and Rash    Penicillins Hives and Rash    Family History  Problem Relation Age of Onset   Early death Mother    Nephritis Mother    Kidney disease Mother    Heart attack Father    Heart disease Sister    Dementia Sister    Heart attack Brother    Dementia Sister     Prior to Admission medications   Medication Sig Start Date End Date Taking? Authorizing Provider  acetaminophen  (TYLENOL ) 325 MG tablet Take 650 mg by mouth every 8 (eight) hours as needed for moderate pain (pain score 4-6) or mild pain (pain score 1-3).    [provider]  apixaban  (ELIQUIS ) 2.5 MG TABS tablet TAKE 1 TABLET TWICE DAILY 01/19/22   Verona Goodwill, MD  benzonatate  (TESSALON ) 100 MG capsule Take 1 capsule (100 mg total) by mouth 2 (two) times daily as needed for cough. 08/05/23   Dorthy Gavia, MD  bisoprolol -hydrochlorothiazide  (ZIAC ) 2.5-6.25 MG tablet Take 1 tablet by mouth in the morning and at bedtime.    [provider]  dexlansoprazole  (DEXILANT ) 60 MG capsule TAKE 1 CAPSULE EVERY DAY 11/12/21   Koberlein, Junell C, MD  fluticasone  (FLONASE ) 50 MCG/ACT nasal spray PLACE 2 SPRAYS INTO BOTH NOSTRILS DAILY. 09/05/21   Koberlein, Junell C, MD  guaiFENesin (GERI-TUSSIN PO) Take 10 mLs by mouth every 6 (six) hours as needed (cough).    [provider]  guaiFENesin (MUCINEX) 600 MG 12 hr tablet Take 600 mg by mouth every 12 (twelve) hours. For 7 days starting 08/05/23    [provider]  loperamide (IMODIUM A-D) 2 MG tablet Take 2 mg by mouth every 8 (eight) hours as needed for diarrhea or loose stools.    [provider]  LORazepam  (ATIVAN ) 1 MG tablet Take 1 mg by mouth at bedtime as needed for anxiety or sleep.    [provider]  losartan  (COZAAR ) 25 MG tablet Take 1 tablet (25 mg total) by mouth daily. 06/27/21   Koberlein, Junell C, MD  Multiple Vitamins-Minerals (PRESERVISION AREDS 2 PO) Take 1 capsule by mouth in the morning and at bedtime.    [provider]  nitroGLYCERIN  (NITROSTAT ) 0.4 MG SL tablet Place 1 tablet (0.4 mg total) under the tongue every 5 (five) minutes x 3 doses as needed for chest pain. 02/01/19   Samtani, Jai-Gurmukh, MD  NON FORMULARY Place 1 Dose into both eyes See admin instructions. Receives injections into both eyes every 5-8 weeks at Dr. Basilio Both office [facility])    [provider]  Simethicone  125 MG CAPS Take 125 mg by mouth every 6 (six) hours as needed (indigestion).    [provider]  traZODone  (DESYREL ) 100 MG tablet Take 1 tablet (100 mg total) by mouth at bedtime. 06/27/21   Koberlein, Junell C, MD    Physical Exam: Vitals:   12/18/23 1100 12/18/23 1130 12/18/23  1337 12/18/23 1445  BP: (!) 107/46 133/61 131/80 (!) 113/52  Pulse: (!) 54 66 71 62  Resp: 15 18 18 19   Temp:   97.6 F (36.4 C)   TempSrc:   Oral   SpO2: 100% 100% 100% 98%  Height:       General: Alert, oriented x3, resting comfortably in no acute distress HEENT: EOMI, oropharynx clear, moist mucous membranes, hearing intact Neck: Trachea midline and no gross thyromegaly Respiratory: Lungs clear to auscultation bilaterally with normal respiratory effort; no w/r/r Cardiovascular: Regular rate and rhythm w/o m/r/g Abdomen: Soft, nontender, nondistended. Positive bowel sounds MSK: No obvious joint deformities or swelling Skin: No obvious rashes or lesions Neurologic: Awake, alert, spontaneously moves all extremities, strength intact Psychiatric: Appropriate mood and affect, conversational and cooperative  Data Reviewed:  Lab Results  Component Value Date   WBC 5.0 12/18/2023   HGB 8.5 (L) 12/18/2023   HCT 26.0 (L) 12/18/2023   MCV 94.9 12/18/2023   PLT 219 12/18/2023   Lab Results  Component Value Date   GLUCOSE 93 12/18/2023   CALCIUM 8.3 (L) 12/18/2023   NA 132 (L) 12/18/2023   K 4.3 12/18/2023   CO2 25 12/18/2023   CL 99 12/18/2023   BUN 16 12/18/2023   CREATININE 0.93 12/18/2023   Lab  Results  Component Value Date   ALT 14 12/18/2023   AST 21 12/18/2023   ALKPHOS 111 12/18/2023   BILITOT 1.0 12/18/2023   Lab Results  Component Value Date   INR 1.1 11/07/2022   INR 0.92 01/02/2013   INR 1.9 02/23/2011    Radiology: CT EXTREM LOWER W CM BIL Result Date: 12/18/2023 CLINICAL DATA:  Anemia after a fall. EXAM: CT OF THE LOWER BILATERAL EXTREMITY WITH CONTRAST TECHNIQUE: Multidetector CT imaging of the lower bilateral extremity was performed according to the standard protocol following intravenous contrast administration. RADIATION DOSE REDUCTION: This exam was performed according to the departmental dose-optimization program which includes automated exposure control, adjustment of the mA and/or kV according to patient size and/or use of iterative reconstruction technique. COMPARISON:  None Available. CONTRAST:  75mL OMNIPAQUE  IOHEXOL  350 MG/ML SOLN FINDINGS: Bones/Joint/Cartilage No evidence for an acute fracture in the visualized portion of the lower sacrum. Visualized iliac bones are osteopenic but appear intact. No evidence for superior or inferior pubic ramus fracture. Patient is status post right total hip replacement without evidence for periprosthetic fracture. Status post pin placement left femoral neck without complicating features. No evidence for an acute femur fracture. No evidence for joint effusion at the knee. Small Baker cyst noted right knee. Ligaments Suboptimally assessed by CT. Muscles and Tendons Muscular anatomy of the right hip and thigh is unremarkable. 5.1 x 3.7 x 5.6 cm "mass" is identified in the inferior left gluteal musculature (axial image 81/4). This is probably an intramuscular hematoma given the patient's history although given these of IV contrast on today's study, enhancing gluteal soft tissue mass is not entirely excluded. Soft tissues Subcutaneous edema/hemorrhage is identified in the inferior left gluteal region deep to the skin. Edema/hemorrhage is  seen in the subcutaneous soft tissues of the posterolateral left hip region. There is also some subtle skin thickening in subcutaneous edema tracking along the posteromedial left distal thigh. IMPRESSION: 1. 5.1 x 3.7 x 5.6 cm "mass" in the inferior left gluteal musculature. This is probably an intramuscular hematoma given the patient's history although given the use of IV contrast on today's study, enhancing gluteal soft tissue mass is not  entirely excluded. This is probably amenable to clinical inspection and follow-up recommended to ensure complete resolution. 2. Subcutaneous edema/hemorrhage in the inferior left gluteal region deep to the skin. Edema/hemorrhage is seen in the subcutaneous soft tissues of the posterolateral left hip region. There is also some subtle skin thickening and subcutaneous edema tracking along the posteromedial left distal thigh. 3. Status post right total hip replacement without evidence for periprosthetic fracture. 4. Status post pin placement left femoral neck without complicating features. Electronically Signed   By: Donnal Fusi M.D.   On: 12/18/2023 12:44   CT ABDOMEN PELVIS W CONTRAST Result Date: 12/18/2023 CLINICAL DATA:  Abdominal trauma from a fall. EXAM: CT ABDOMEN AND PELVIS WITH CONTRAST TECHNIQUE: Multidetector CT imaging of the abdomen and pelvis was performed using the standard protocol following bolus administration of intravenous contrast. RADIATION DOSE REDUCTION: This exam was performed according to the departmental dose-optimization program which includes automated exposure control, adjustment of the mA and/or kV according to patient size and/or use of iterative reconstruction technique. CONTRAST:  75mL OMNIPAQUE  IOHEXOL  350 MG/ML SOLN COMPARISON:  04/15/2006 FINDINGS: Lower chest: Markedly enlarged heart with an interval increase in size. Cylindrical and cystic bronchiectasis with associated parenchymal densities in the right middle lobe and lingula.  Hepatobiliary: Cholecystectomy clips. Small right lobe liver cyst not needing imaging follow-up. Progressive intrahepatic and extrahepatic biliary ductal dilatation, with a common duct diameter of 18.9 mm, previously 10.2 mm. No obstructing stone or mass seen. Pancreas: Moderate to marked pancreatic atrophy. Spleen: Normal in size without focal abnormality. Adrenals/Urinary Tract: Normal-appearing adrenal glands. Several tiny right renal cysts not needing imaging follow-up. Normal-appearing left kidney and visualized portions of the ureters in the urinary bladder. Portions of the urinary bladder and distal ureters are obscured by streak artifacts from a right hip prosthesis and left hip fixation hardware. Stomach/Bowel: Unremarkable stomach, small bowel and colon. Surgically absent appendix. Vascular/Lymphatic: Atheromatous arterial calcifications without aneurysm. No enlarged lymph nodes. Reproductive: Status post hysterectomy. No adnexal masses. Other: No abdominal wall hernia or abnormality. No abdominopelvic ascites. Musculoskeletal: Right hip prosthesis and left hip fixation hardware. Mild lumbar and lower thoracic spine degenerative changes. Interval approximately 30% L1 superior endplate compression deformity and approximately 10% T11 inferior endplate compression deformity. No acute fracture lines or bony retropulsion seen. IMPRESSION: 1. No acute abnormality. 2. Interval approximately 30% L1 superior endplate compression deformity and approximately 10% T11 inferior endplate compression deformity. These are likely chronic. 3. Progressive intrahepatic and extrahepatic biliary ductal dilatation, with a common duct diameter of 18.9 mm, previously 10.2 mm. This is likely due to the patient's age and cholecystectomy. 4. Marked cardiomegaly with an interval increase in size. 5. Cylindrical and cystic bronchiectasis with associated parenchymal densities in the right middle lobe and lingula. 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 Electronically Signed   By: Catherin Closs M.D.   On: 12/18/2023 12:21   CT HEAD WO CONTRAST ( ) Result Date: 12/18/2023 CLINICAL DATA:  Fall with minor head trauma. EXAM: CT HEAD WITHOUT CONTRAST TECHNIQUE: Contiguous axial images were obtained from the base of the skull through the vertex without intravenous contrast. RADIATION DOSE REDUCTION: This exam was performed according to the departmental dose-optimization program which includes automated exposure control, adjustment of the mA and/or kV according to patient size and/or use of iterative reconstruction technique. COMPARISON:  Head CT 11/07/2022 FINDINGS: Brain: There is moderate cerebral atrophy, mild cerebellar atrophy, moderate to severe small-vessel change of the cerebral white  matter and mild to moderate atrophic ventriculomegaly. No cortical based acute infarct, hemorrhage, mass, mass effect or midline shift are seen. The basal cisterns are clear. Vascular: Both siphons and the distal left vertebral artery are heavily calcified. No hyperdense central vessel is seen. Skull: Negative for fractures or focal lesions. No visible scalp hematoma. Sinuses/Orbits: There is moderate circumferential membrane thickening again in the left maxillary sinus but there is new fluid in the sinus which could indicate acute on chronic sinusitis. Rest of the sinuses, bilateral mastoid air cells, and middle ears are clear. Nasal septum is midline. Negative orbits apart from old lens replacements. Other: None. IMPRESSION: 1. No acute intracranial CT findings or depressed skull fractures. 2. Atrophy and small-vessel disease.  Stable exam. 3. Left maxillary sinusitis. Electronically Signed   By: Denman Fischer M.D.   On: 12/18/2023 07:51   DG Shoulder Left Result Date: 12/18/2023 CLINICAL DATA:  Fall. EXAM: LEFT SHOULDER - 3 VIEW COMPARISON:  None Available. FINDINGS: Osseous structures are osteopenic.  There is no evidence of fracture or dislocation. Acromioclavicular degenerative change with narrowing and osteophyte formation. IMPRESSION: Acromioclavicular degenerative changes. No acute osseous abnormalities. Electronically Signed   By: Sydell Eva M.D.   On: 12/18/2023 07:30    Assessment and Plan: 74F h/o Afib, s/p PPM, HTN, HLD, CVA, hypothyroidism, GERD, osteoporosis, and IDA p/w GLF c/b R buttock hematoma and symptomatic anemia.  GLF c/b R buttock hematoma Symptomatic anemia -PT/OT consulted; apprec eval/recs -CBC q12h for now -Tylenol  prn  T11/L1 endplate compression fractures Osteoporosis -Cholecalciferol  2000IU daily -Consider Prolia on discharge -F/u 25-OH vitamin D   Hyopcalcemia Hypoalbuminemia FTT -PT/OT per above -RD consulted -Calorie count  Afib -PTA bisoprolol  and apixaban  2.5mg   BID  HTN -PTA bisoprolol  and hydrochlorothiazide     Advance Care Planning:   Code Status: Limited: Do not attempt resuscitation (DNR) -DNR-LIMITED -Do Not Intubate/DNI    Consults: N/A  Family Communication: N/A  Severity of Illness: The appropriate patient status for this patient is OBSERVATION. Observation status is judged to be reasonable and necessary in order to provide the required intensity of service to ensure the patient's safety. The patient's presenting symptoms, physical exam findings, and initial radiographic and laboratory data in the context of their medical condition is felt to place them at decreased risk for further clinical deterioration. Furthermore, it is anticipated that the patient will be medically stable for discharge from the hospital within 2 midnights of admission.    ------- I spent 55 minutes reviewing previous labs/notes, obtaining separate history at the bedside, counseling/discussing the treatment plan outlined above, ordering medications/tests, and performing clinical documentation.  Author: Arne Langdon, MD 12/18/2023 4:01 PM  For on  call review [email or web address].

## 2023-12-18 NOTE — ED Triage Notes (Signed)
 Pt says she has fallen several times- she remembers falling this morning, she has pain in the left buttocks (noted bruising to the buttocks and posterior upper left leg), right lower back (small area of light bruising) and pain in the left shoulder area. Abrasion to the right forearm with scabbing.

## 2023-12-18 NOTE — ED Notes (Signed)
 Called 6 N charge aware patient is coming

## 2023-12-18 NOTE — ED Provider Notes (Signed)
 [facility] O[facility] EMERGENCY DEPARTMENT AT [facility] Provider Note   CSN: [id number] Arrival date & time: 12/18/23  0645     History  Chief Complaint  Patient presents with   Megan Olsen    Megan Olsen is a 89 y.o. female.   Fall  Patient presents after fall.  Has had 3 different falls this week.  Is on anticoagulation.  Reported not hit head.  Reportedly had some hypotension for EMS.  Recently seen in the hospital for same.  Has bruising behind left leg.  Now complaining of pain in left shoulder.  History of pacemaker.  States she does not know last fall.  States she does not feel it coming just goes down.  Does not feel dizzy before it.  Did not lose consciousness.    Past Medical History:  Diagnosis Date   Age-related macular degeneration, wet, both eyes (HCC)    Anxiety disorder 07/06/2014   Arthritis    "all over" (04/27/2018)   Atrial fibrillation (HCC)    catheter ablation of SVT in 2002   Benign essential HTN 07/06/2014   Constipation, intermittent 08/04/2007   CVA (cerebrovascular accident) (HCC) 2003   left brain; denies residual on 04/27/2018   Depression    GERD (gastroesophageal reflux disease)    Heart murmur    hx (04/27/2018)   Hypothyroidism 09/07/2014   "off RX now" (04/27/2018)   Iron deficiency anemia    Iron deficiency anemia, unspecified 01/02/2013   Osteoarthritis    Osteoporosis 01/11/2007   Pneumonia    "one time; years and years ago" (04/27/2018)   Presence of permanent cardiac pacemaker 04/27/2018    Dual Chamber   Right BBB/left ant fasc block     Home Medications Prior to Admission medications   Medication Sig Start Date End Date Taking? Authorizing Provider  acetaminophen  (TYLENOL ) 325 MG tablet Take 650 mg by mouth every 8 (eight) hours as needed for moderate pain (pain score 4-6) or mild pain (pain score 1-3).    [provider]  apixaban  (ELIQUIS ) 2.5 MG TABS tablet TAKE 1 TABLET TWICE DAILY 01/19/22   Verona Goodwill, MD   benzonatate  (TESSALON ) 100 MG capsule Take 1 capsule (100 mg total) by mouth 2 (two) times daily as needed for cough. 08/05/23   Dorthy Gavia, MD  bisoprolol -hydrochlorothiazide  (ZIAC ) 2.5-6.25 MG tablet Take 1 tablet by mouth in the morning and at bedtime.    [provider]  dexlansoprazole  (DEXILANT ) 60 MG capsule TAKE 1 CAPSULE EVERY DAY 11/12/21   Koberlein, Junell C, MD  fluticasone  (FLONASE ) 50 MCG/ACT nasal spray PLACE 2 SPRAYS INTO BOTH NOSTRILS DAILY. 09/05/21   Koberlein, Junell C, MD  guaiFENesin (GERI-TUSSIN PO) Take 10 mLs by mouth every 6 (six) hours as needed (cough).    [provider]  guaiFENesin (MUCINEX) 600 MG 12 hr tablet Take 600 mg by mouth every 12 (twelve) hours. For 7 days starting 08/05/23    [provider]  loperamide (IMODIUM A-D) 2 MG tablet Take 2 mg by mouth every 8 (eight) hours as needed for diarrhea or loose stools.    [provider]  LORazepam  (ATIVAN ) 1 MG tablet Take 1 mg by mouth at bedtime as needed for anxiety or sleep.    [provider]  losartan  (COZAAR ) 25 MG tablet Take 1 tablet (25 mg total) by mouth daily. 06/27/21   Koberlein, Junell C, MD  Multiple Vitamins-Minerals (PRESERVISION AREDS 2 PO) Take 1 capsule by mouth in the morning  and at bedtime.    [provider]  nitroGLYCERIN  (NITROSTAT ) 0.4 MG SL tablet Place 1 tablet (0.4 mg total) under the tongue every 5 (five) minutes x 3 doses as needed for chest pain. 02/01/19   Samtani, Jai-Gurmukh, MD  NON FORMULARY Place 1 Dose into both eyes See admin instructions. Receives injections into both eyes every 5-8 weeks at Dr. Basilio Both office [facility])    [provider]  Simethicone  125 MG CAPS Take 125 mg by mouth every 6 (six) hours as needed (indigestion).    [provider]  traZODone  (DESYREL ) 100 MG tablet Take 1 tablet (100 mg total) by mouth at bedtime. 06/27/21   Koberlein, Junell C, MD      Allergies     Propoxyphene n-acetaminophen , Celecoxib, Fexofenadine, and Penicillins    Review of Systems   Review of Systems  Physical Exam Updated Vital Signs BP 133/61   Pulse 66   Temp (!) 97.4 F (36.3 C) (Oral)   Resp 18   Ht 5\' 5"  (1.651 m)   SpO2 100%   BMI 19.81 kg/m  Physical Exam Vitals and nursing note reviewed.  HENT:     Head: Normocephalic and atraumatic.  Cardiovascular:     Rate and Rhythm: Regular rhythm.  Pulmonary:     Breath sounds: No wheezing.  Abdominal:     Tenderness: There is no abdominal tenderness.  Musculoskeletal:     Cervical back: Neck supple.     Comments: No tenderness to left shoulder but some pain with movement.  No deformity.  Ecchymosis behind left hip and thigh.  Skin:    Capillary Refill: Capillary refill takes less than 2 seconds.  Neurological:     Mental Status: She is alert and oriented to person, place, and time.     ED Results / Procedures / Treatments   Labs (all labs ordered are listed, but only abnormal results are displayed) Labs Reviewed  COMPREHENSIVE METABOLIC PANEL WITH GFR - Abnormal; Notable for the following components:      Result Value   Sodium 132 (*)    Calcium 8.3 (*)    Total Protein 5.4 (*)    Albumin 2.5 (*)    GFR, Estimated 57 (*)    All other components within normal limits  CBC - Abnormal; Notable for the following components:   RBC 2.74 (*)    Hemoglobin 8.5 (*)    HCT 26.0 (*)    All other components within normal limits  TSH  POC OCCULT BLOOD, ED  TYPE AND SCREEN    EKG None  Radiology CT EXTREM LOWER W CM BIL Result Date: 12/18/2023 CLINICAL DATA:  Anemia after a fall. EXAM: CT OF THE LOWER BILATERAL EXTREMITY WITH CONTRAST TECHNIQUE: Multidetector CT imaging of the lower bilateral extremity was performed according to the standard protocol following intravenous contrast administration. RADIATION DOSE REDUCTION: This exam was performed according to the departmental dose-optimization program  which includes automated exposure control, adjustment of the mA and/or kV according to patient size and/or use of iterative reconstruction technique. COMPARISON:  None Available. CONTRAST:  75mL OMNIPAQUE  IOHEXOL  350 MG/ML SOLN FINDINGS: Bones/Joint/Cartilage No evidence for an acute fracture in the visualized portion of the lower sacrum. Visualized iliac bones are osteopenic but appear intact. No evidence for superior or inferior pubic ramus fracture. Patient is status post right total hip replacement without evidence for periprosthetic fracture. Status post pin placement left femoral neck without complicating features. No evidence for an acute femur  fracture. No evidence for joint effusion at the knee. Small Baker cyst noted right knee. Ligaments Suboptimally assessed by CT. Muscles and Tendons Muscular anatomy of the right hip and thigh is unremarkable. 5.1 x 3.7 x 5.6 cm "mass" is identified in the inferior left gluteal musculature (axial image 81/4). This is probably an intramuscular hematoma given the patient's history although given these of IV contrast on today's study, enhancing gluteal soft tissue mass is not entirely excluded. Soft tissues Subcutaneous edema/hemorrhage is identified in the inferior left gluteal region deep to the skin. Edema/hemorrhage is seen in the subcutaneous soft tissues of the posterolateral left hip region. There is also some subtle skin thickening in subcutaneous edema tracking along the posteromedial left distal thigh. IMPRESSION: 1. 5.1 x 3.7 x 5.6 cm "mass" in the inferior left gluteal musculature. This is probably an intramuscular hematoma given the patient's history although given the use of IV contrast on today's study, enhancing gluteal soft tissue mass is not entirely excluded. This is probably amenable to clinical inspection and follow-up recommended to ensure complete resolution. 2. Subcutaneous edema/hemorrhage in the inferior left gluteal region deep to the skin.  Edema/hemorrhage is seen in the subcutaneous soft tissues of the posterolateral left hip region. There is also some subtle skin thickening and subcutaneous edema tracking along the posteromedial left distal thigh. 3. Status post right total hip replacement without evidence for periprosthetic fracture. 4. Status post pin placement left femoral neck without complicating features. Electronically Signed   By: Donnal Fusi M.D.   On: 12/18/2023 12:44   CT ABDOMEN PELVIS W CONTRAST Result Date: 12/18/2023 CLINICAL DATA:  Abdominal trauma from a fall. EXAM: CT ABDOMEN AND PELVIS WITH CONTRAST TECHNIQUE: Multidetector CT imaging of the abdomen and pelvis was performed using the standard protocol following bolus administration of intravenous contrast. RADIATION DOSE REDUCTION: This exam was performed according to the departmental dose-optimization program which includes automated exposure control, adjustment of the mA and/or kV according to patient size and/or use of iterative reconstruction technique. CONTRAST:  75mL OMNIPAQUE  IOHEXOL  350 MG/ML SOLN COMPARISON:  04/15/2006 FINDINGS: Lower chest: Markedly enlarged heart with an interval increase in size. Cylindrical and cystic bronchiectasis with associated parenchymal densities in the right middle lobe and lingula. Hepatobiliary: Cholecystectomy clips. Small right lobe liver cyst not needing imaging follow-up. Progressive intrahepatic and extrahepatic biliary ductal dilatation, with a common duct diameter of 18.9 mm, previously 10.2 mm. No obstructing stone or mass seen. Pancreas: Moderate to marked pancreatic atrophy. Spleen: Normal in size without focal abnormality. Adrenals/Urinary Tract: Normal-appearing adrenal glands. Several tiny right renal cysts not needing imaging follow-up. Normal-appearing left kidney and visualized portions of the ureters in the urinary bladder. Portions of the urinary bladder and distal ureters are obscured by streak artifacts from a right  hip prosthesis and left hip fixation hardware. Stomach/Bowel: Unremarkable stomach, small bowel and colon. Surgically absent appendix. Vascular/Lymphatic: Atheromatous arterial calcifications without aneurysm. No enlarged lymph nodes. Reproductive: Status post hysterectomy. No adnexal masses. Other: No abdominal wall hernia or abnormality. No abdominopelvic ascites. Musculoskeletal: Right hip prosthesis and left hip fixation hardware. Mild lumbar and lower thoracic spine degenerative changes. Interval approximately 30% L1 superior endplate compression deformity and approximately 10% T11 inferior endplate compression deformity. No acute fracture lines or bony retropulsion seen. IMPRESSION: 1. No acute abnormality. 2. Interval approximately 30% L1 superior endplate compression deformity and approximately 10% T11 inferior endplate compression deformity. These are likely chronic. 3. Progressive intrahepatic and extrahepatic biliary ductal dilatation, with a common duct  diameter of 18.9 mm, previously 10.2 mm. This is likely due to the patient's age and cholecystectomy. 4. Marked cardiomegaly with an interval increase in size. 5. Cylindrical and cystic bronchiectasis with associated parenchymal densities in the right middle lobe and lingula. This is likely due to chronic atypical infection such as Mycobacterium avium intracellulare. Electronically Signed   By: Catherin Closs M.D.   On: 12/18/2023 12:21   CT HEAD WO CONTRAST ( ) Result Date: 12/18/2023 CLINICAL DATA:  Fall with minor head trauma. EXAM: CT HEAD WITHOUT CONTRAST TECHNIQUE: Contiguous axial images were obtained from the base of the skull through the vertex without intravenous contrast. RADIATION DOSE REDUCTION: This exam was performed according to the departmental dose-optimization program which includes automated exposure control, adjustment of the mA and/or kV according to patient size and/or use of iterative reconstruction technique. COMPARISON:  Head  CT 11/07/2022 FINDINGS: Brain: There is moderate cerebral atrophy, mild cerebellar atrophy, moderate to severe small-vessel change of the cerebral white matter and mild to moderate atrophic ventriculomegaly. No cortical based acute infarct, hemorrhage, mass, mass effect or midline shift are seen. The basal cisterns are clear. Vascular: Both siphons and the distal left vertebral artery are heavily calcified. No hyperdense central vessel is seen. Skull: Negative for fractures or focal lesions. No visible scalp hematoma. Sinuses/Orbits: There is moderate circumferential membrane thickening again in the left maxillary sinus but there is new fluid in the sinus which could indicate acute on chronic sinusitis. Rest of the sinuses, bilateral mastoid air cells, and middle ears are clear. Nasal septum is midline. Negative orbits apart from old lens replacements. Other: None. IMPRESSION: 1. No acute intracranial CT findings or depressed skull fractures. 2. Atrophy and small-vessel disease.  Stable exam. 3. Left maxillary sinusitis. Electronically Signed   By: Denman Fischer M.D.   On: 12/18/2023 07:51   DG Shoulder Left Result Date: 12/18/2023 CLINICAL DATA:  Fall. EXAM: LEFT SHOULDER - 3 VIEW COMPARISON:  None Available. FINDINGS: Osseous structures are osteopenic. There is no evidence of fracture or dislocation. Acromioclavicular degenerative change with narrowing and osteophyte formation. IMPRESSION: Acromioclavicular degenerative changes. No acute osseous abnormalities. Electronically Signed   By: Sydell Eva M.D.   On: 12/18/2023 07:30    Procedures Procedures    Medications Ordered in ED Medications  iohexol  (OMNIPAQUE ) 350 MG/ML injection 75 mL (75 mLs Intravenous Contrast Given 12/18/23 1141)    ED Course/ Medical Decision Making/ A&P                                 Medical Decision Making Amount and/or Complexity of Data Reviewed Labs: ordered. Radiology: ordered.  Risk Prescription drug  management.   Patient with recurrent falls.  Does have pacemaker.  However does not feel dizzy before it.  Has bruising on the left thigh from previous fall.  Negative x-ray 4 days ago for that.  Will now get some more workup.  Will get blood work.  Will interrogate pacemaker if possible.  Will get head CT due to the recurrent falls.  No dysuria.   Head CT reassuring.  Hemoglobin is down.  Blood pressure overall good.  Hemoccult done and negative.  However with the anemia CT scan done.  Has bruising down the leg and pain on the buttock.  CT scan does show potential hematoma.  I think that is most likely causing anemia.  With her anticoagulation I think she will benefit for admission  in the hospital for monitoring.  Discussed with Dr. Ramiro Burly from trauma surgery.  Does not need trauma surgery evaluation at this time but if hemoglobin continues to go down they can be involved.  Will discuss with hospitalist        Final Clinical Impression(s) / ED Diagnoses Final diagnoses:  Fall, initial encounter  Anemia, unspecified type  Hematoma of left buttock    Rx / DC Orders ED Discharge Orders     None         Mozell Arias, MD 12/18/23 1322

## 2023-12-18 NOTE — Consult Note (Addendum)
 WOC Nurse Consult Note: Reason for Consult: R arm and R lower leg wounds  Wound type: 1. Full thickness R arm skin tear post fall 2.  R lower leg full thickness unknown etiology, on review of EMR there is history of laceration to the R leg 02/2023 that was followed by home health ([facility])  Pressure Injury POA: NA not pressure  Measurement: see nursing flowsheet  Wound bed: red moist R arm skin tear, R lower leg 50% red 50% slough  Drainage (amount, consistency, odor) see nursing flowsheet  Periwound: ecchymosis to arm, peeling skin to leg  Dressing procedure/placement/frequency:  Cleanse R arm skin tear with NS, apply Vaseline gauze ([employer] #[id number]) to wound bed every other day, cover with silicone foam or Telfa nonstick gauze and Kerlix roll gauze whichever is preferred. Change every other day.  Cleanse R lower leg wound with NS, apply Medihoney to wound bed daily, cover with dry gauze and secure with silicone foam or ABD pad and Kerlix roll gauze whichever is preferred.   POC discussed with bedside nurse.  Patient resides at [facility] and uncertain if wound is followed there.  If wound not followed by nurse at [facility] or home health would benefit from ongoing management at wound care center.   WOC team will not follow. Re-consult if further needs arise.   Thank you,    Ronni Colace MSN, RN-BC, [employer] [phone]

## 2023-12-18 NOTE — ED Notes (Signed)
 Transported to CT

## 2023-12-19 DIAGNOSIS — D649 Anemia, unspecified: Secondary | ICD-10-CM | POA: Diagnosis not present

## 2023-12-19 DIAGNOSIS — W19XXXA Unspecified fall, initial encounter: Secondary | ICD-10-CM

## 2023-12-19 DIAGNOSIS — I951 Orthostatic hypotension: Secondary | ICD-10-CM | POA: Diagnosis not present

## 2023-12-19 DIAGNOSIS — T148XXA Other injury of unspecified body region, initial encounter: Secondary | ICD-10-CM | POA: Diagnosis not present

## 2023-12-19 DIAGNOSIS — S300XXA Contusion of lower back and pelvis, initial encounter: Secondary | ICD-10-CM | POA: Diagnosis not present

## 2023-12-19 LAB — RETICULOCYTES
Immature Retic Fract: 26.9 % — ABNORMAL HIGH (ref 2.3–15.9)
RBC.: 3.01 MIL/uL — ABNORMAL LOW (ref 3.87–5.11)
Retic Count, Absolute: 79.2 10*3/uL (ref 19.0–186.0)
Retic Ct Pct: 2.6 % (ref 0.4–3.1)

## 2023-12-19 LAB — IRON AND TIBC
Iron: 46 ug/dL (ref 28–170)
Saturation Ratios: 19 % (ref 10.4–31.8)
TIBC: 249 ug/dL — ABNORMAL LOW (ref 250–450)
UIBC: 203 ug/dL

## 2023-12-19 LAB — CBC
HCT: 29 % — ABNORMAL LOW (ref 36.0–46.0)
Hemoglobin: 9.4 g/dL — ABNORMAL LOW (ref 12.0–15.0)
MCH: 31 pg (ref 26.0–34.0)
MCHC: 32.4 g/dL (ref 30.0–36.0)
MCV: 95.7 fL (ref 80.0–100.0)
Platelets: 290 10*3/uL (ref 150–400)
RBC: 3.03 MIL/uL — ABNORMAL LOW (ref 3.87–5.11)
RDW: 14.1 % (ref 11.5–15.5)
WBC: 6.4 10*3/uL (ref 4.0–10.5)
nRBC: 0 % (ref 0.0–0.2)

## 2023-12-19 LAB — MAGNESIUM: Magnesium: 1.7 mg/dL (ref 1.7–2.4)

## 2023-12-19 LAB — RENAL FUNCTION PANEL
Albumin: 2.6 g/dL — ABNORMAL LOW (ref 3.5–5.0)
Anion gap: 10 (ref 5–15)
BUN: 14 mg/dL (ref 8–23)
CO2: 23 mmol/L (ref 22–32)
Calcium: 8.3 mg/dL — ABNORMAL LOW (ref 8.9–10.3)
Chloride: 98 mmol/L (ref 98–111)
Creatinine, Ser: 0.89 mg/dL (ref 0.44–1.00)
GFR, Estimated: >60 mL/min (ref >60)
Glucose, Bld: 151 mg/dL — ABNORMAL HIGH (ref 70–99)
Phosphorus: 4.2 mg/dL (ref 2.5–4.6)
Potassium: 3.8 mmol/L (ref 3.5–5.1)
Sodium: 131 mmol/L — ABNORMAL LOW (ref 135–145)

## 2023-12-19 LAB — TSH: TSH: 4.456 u[IU]/mL (ref 0.350–4.500)

## 2023-12-19 LAB — VITAMIN B12: Vitamin B-12: 168 pg/mL — ABNORMAL LOW (ref 180–914)

## 2023-12-19 LAB — CK: Total CK: 59 U/L (ref 38–234)

## 2023-12-19 LAB — FOLATE: Folate: 8.5 ng/mL (ref >5.9)

## 2023-12-19 LAB — CORTISOL: Cortisol, Plasma: 20.3 ug/dL

## 2023-12-19 LAB — FERRITIN: Ferritin: 90 ng/mL (ref 11–307)

## 2023-12-19 MED ORDER — BISOPROLOL FUMARATE 5 MG PO TABS
2.5000 mg | ORAL_TABLET | Freq: Every day | ORAL | Status: DC
  Administered 2023-12-19: 2.5 mg via ORAL
  Filled 2023-12-19: qty 0.5

## 2023-12-19 MED ORDER — MAGNESIUM SULFATE 2 GM/50ML IV SOLN
2.0000 g | Freq: Once | INTRAVENOUS | Status: AC
  Administered 2023-12-19: 2 g via INTRAVENOUS
  Filled 2023-12-19: qty 50

## 2023-12-19 MED ORDER — POTASSIUM CHLORIDE IN NACL 20-0.9 MEQ/L-% IV SOLN
INTRAVENOUS | Status: DC
  Filled 2023-12-19 (×2): qty 1000

## 2023-12-19 NOTE — Progress Notes (Signed)
   12/19/23 0900  Orthostatic Sitting  BP- Sitting 128/58  Pulse- Sitting 69  Orthostatic Standing at 3 minutes  BP- Standing at 3 minutes 104/88  Pulse- Standing at 3 minutes 66   While standing at sink pt c/o of mild lightheadedness. While standing BP recorded as above. When returned to sitting, BP improved, along with symptoms. Will monitor orthostatic vitals at future therapy sessions. See full OT eval for further details.   Delories Mauri C, OT  Acute Rehabilitation Services Office [phone] Secure chat preferred

## 2023-12-19 NOTE — Plan of Care (Signed)

## 2023-12-19 NOTE — Progress Notes (Signed)
 PROGRESS NOTE  Megan Olsen ZOX:[id number] DOB: 03-07-1928   PCP: Aida House, MD  Patient is from: [facility].  Independently ambulates at baseline.  DOA: 12/18/2023 LOS: 0  Chief complaints Chief Complaint  Patient presents with   Fall     Brief Narrative / Interim history: 89 year old F with PMH of CVA, CAD/non-STEMI, A-fib on Eliquis , SSS/PPM, right THA, chronic IDA, HTN, HLD, GERD, hypothyroidism, anxiety and depression brought to ED from [facility] due to ground-level fall.  Patient was in his usual state of health when she fell back on the shower stool knobs as she tried to reach for a towel, and sustained pain, bruising and hematoma to left buttock.  Did not hit head or lose consciousness.  In ED, stable vitals.  Na 132.  Hgb 8.5 (was 11.7 in 07/2023).  Hemoccult negative.  CT head without acute finding.  EKG junctional rhythm with IVCD and QTc of 530 but in the setting of wide QRS, similar to prior EKG in 2024.   CT abdomen and pelvis showed T11/L1 endplate compression fractures, progressive intrahepatic and extrahepatic biliary ductal dilation with CBD dilated to 18.9 mm, marked cardiomegaly with interval increase in size and cylindrical and cystic bronchiectasis with associated parenchymal densities in RML and lingula that can be seen in chronic atypical infection such as MAI.    Bilateral lower extremity CT with contrast showed 5.1 x 3.7 x 5.6 cm mass in inferior left gluteal musculature which could be intramuscular hematoma, subcutaneous edema/hemorrhage in the inferior left gluteal region deep to the skin.    Subjective: Seen and examined earlier this morning.  No major events overnight or this morning.  No complaints other than some dizziness and soreness in left buttock.  Denies chest pain or shortness of breath.  Denies bowel or bladder habit change.  Denies focal neurosymptoms.  Objective: Vitals:   12/18/23 2032 12/19/23 0527 12/19/23 0738 12/19/23  0836  BP: 133/69 (!) 111/55 (!) 105/53 (!) 113/59  Pulse: 77 78 72 67  Resp: 14 16  16   Temp: 97.8 F (36.6 C) 98.1 F (36.7 C) 97.8 F (36.6 C)   TempSrc: Oral Oral Oral   SpO2: 100% 98% 95% 100%  Height:        Examination:  GENERAL: No apparent distress.  Nontoxic. HEENT: MMM.  Vision and hearing grossly intact.  NECK: Supple.  No apparent JVD.  RESP:  No IWOB.  Fair aeration bilaterally. CVS:  RRR. Heart sounds normal.  ABD/GI/GU: BS+. Abd soft, NTND.  MSK/EXT:  Moves extremities. No apparent deformity. No edema.  Palpable tennis ball sized hematoma in left buttock SKIN: Ecchymosis over posterior aspect of left thigh.  Skin laceration in LUE and RLE. NEURO: AA.  Oriented appropriately.  No apparent focal neuro deficit. PSYCH: Calm. Normal affect.   Consultants:  None  Procedures: None  Microbiology summarized: None  Assessment and plan: Ground-level fall at ALF-looks mechanical but she has orthostatic hypotension as well.  Also on significant dose of trazodone  and Ativan  which could put her at risk of polypharmacy.  - Hold home antihypertensive meds-received low-dose bisoprolol  earlier - Fall precaution, IV fluid, PT/OT -Repeat orthostatic vitals in the morning - Check echocardiogram  Orthostatic hypotension/history of hypertension: SBP dropped from 128-104.  She had dizziness earlier this morning.  TTE in 2020 with LVEF of 55 to 60%, severe LAE and RAE.  Significant cardiomegaly on imaging.  Appears euvolemic on exam.  Denies cardiopulmonary symptoms. -Hold home antihypertensive meds -  Echocardiogram, TSH, cortisol, IV fluid, PT/OT as above - Fall precaution, TED hose and elevate head of bed  Left gluteal hematoma/left gluteal and left thigh ecchymosis: Likely due to the above.  CT as above.  She has palpable tennis ball sized deep hematoma in inferior left buttock.  Surprisingly Hgb up about 1 g.  She might have been dehydrated. -Continue monitoring - Hold  Eliquis  today  T11/L1 endplate compression fractures Osteoporosis-vitamin D  58. - Pain control, fall precaution, PT/OT  Normocytic anemia: Hgb improved to 9.4 despite Eliquis .  Hemoconcentration likely from dehydration. Recent Labs    08/05/23 1357 12/18/23 0813 12/19/23 0845  HGB 11.7* 8.5* 9.4*  -Hold Eliquis  today -Check anemia panel  Paroxysmal A-fib: EKG with junctional rhythm/IVCD: She is on low-dose Ziac  - Hold home bisoprolol  - Hold Eliquis  today. - Optimize electrolytes  At risk for polypharmacy: Seems to take trazodone  100 mg daily and Ativan  1 mg daily.  Maybe does not need antihypertensive meds as well. -Hold sedating meds and antihypertensive meds.  Hyponatremia: Mild - Discontinue HCTZ - Recheck in the morning  SSS s/p PPM    Nutrition Body mass index is 19.81 kg/m. -Consult dietitian.          DVT prophylaxis:  Place TED hose Start: 12/19/23 1146  Code Status: DNR Family Communication: None at bedside Level of care: Med-Surg Status is: Observation The patient will require care spanning > 2 midnights and should be moved to inpatient because: Orthostatic hypotension,   Final disposition: ALF with home health   55 minutes with more than 50% spent in reviewing records, counseling patient/family and coordinating care.   Sch Meds:  Scheduled Meds:  cholecalciferol   2,000 Units Oral Daily   guaiFENesin  600 mg Oral Q12H   leptospermum manuka honey  1 Application Topical Daily   pantoprazole   40 mg Oral Daily   ramelteon  8 mg Oral QHS   Continuous Infusions:  0.9 % NaCl with KCl 20 mEq / L     magnesium  sulfate bolus IVPB     PRN Meds:.acetaminophen   Antimicrobials: Anti-infectives (From admission, onward)    None        I have personally reviewed the following labs and images: CBC: Recent Labs  Lab 12/18/23 0813 12/19/23 0845  WBC 5.0 6.4  HGB 8.5* 9.4*  HCT 26.0* 29.0*  MCV 94.9 95.7  PLT 219 290   BMP &GFR Recent  Labs  Lab 12/18/23 0813 12/19/23 0845  NA 132* 131*  K 4.3 3.8  CL 99 98  CO2 25 23  GLUCOSE 93 151*  BUN 16 14  CREATININE 0.93 0.89  CALCIUM 8.3* 8.3*  MG  --  1.7  PHOS  --  4.2   CrCl cannot be calculated (Unknown ideal weight.). Liver & Pancreas: Recent Labs  Lab 12/18/23 0813 12/19/23 0845  AST 21  --   ALT 14  --   ALKPHOS 111  --   BILITOT 1.0  --   PROT 5.4*  --   ALBUMIN 2.5* 2.6*   No results for input(s): "LIPASE", "AMYLASE" in the last 168 hours. No results for input(s): "AMMONIA" in the last 168 hours. Diabetic: No results for input(s): "HGBA1C" in the last 72 hours. No results for input(s): "GLUCAP" in the last 168 hours. Cardiac Enzymes: Recent Labs  Lab 12/19/23 0845  CKTOTAL 59   No results for input(s): "PROBNP" in the last 8760 hours. Coagulation Profile: No results for input(s): "INR", "PROTIME" in the last 168  hours. Thyroid  Function Tests: Recent Labs    12/18/23 0813  TSH 3.192   Lipid Profile: No results for input(s): "CHOL", "HDL", "LDLCALC", "TRIG", "CHOLHDL", "LDLDIRECT" in the last 72 hours. Anemia Panel: No results for input(s): "VITAMINB12", "FOLATE", "FERRITIN", "TIBC", "IRON", "RETICCTPCT" in the last 72 hours. Urine analysis:    Component Value Date/Time   COLORURINE YELLOW 06/08/2022 1127   APPEARANCEUR CLEAR 06/08/2022 1127   LABSPEC 1.010 06/08/2022 1127   PHURINE 6.0 06/08/2022 1127   GLUCOSEU NEGATIVE 06/08/2022 1127   HGBUR NEGATIVE 06/08/2022 1127   BILIRUBINUR NEGATIVE 06/08/2022 1127   KETONESUR NEGATIVE 06/08/2022 1127   PROTEINUR NEGATIVE 06/08/2022 1127   NITRITE NEGATIVE 06/08/2022 1127   LEUKOCYTESUR NEGATIVE 06/08/2022 1127   Sepsis Labs: Invalid input(s): "PROCALCITONIN", "LACTICIDVEN"  Microbiology: No results found for this or any previous visit (from the past 240 hours).  Radiology Studies: No results found.    Ariel Wingrove T. Marigrace Mccole [facility]  If 7PM-7AM, please contact  night-coverage [email or web address] 12/19/2023, 11:46 AM

## 2023-12-19 NOTE — Evaluation (Signed)
 Occupational Therapy Evaluation Patient Details Name: Megan Olsen MRN: [id number] DOB: Jan 02, 1928 Today's Date: 12/19/2023   History of Present Illness   Pt is a 89 y.o. female admitted 5/31 from [facility] for fall. Reports 3rd fall of the month. CT head & abdomen showed no acute changes. Further imaging showed no acute injuries, however hematoma on L pelvis.  PMH: macular degeneration, a-fib, HTN, CVA, OA, osteoporosis     Clinical Impressions Pt admitted based on above, and was seen based on problem list below. PTA pt was living at ALF and wasindependent with ADLs. Today pt is requiring set up  to s for safety for ADLs. Bed mobility and functional transfers are  s for safety. Pt benefits from use of external support during mobility for balance. Pt near functional baseline but would greatly benefit from [facility] to improve strength, balance, and promote safety within the home environment. OT will continue to follow acutely to maximize functional independence.        If plan is discharge home, recommend the following:   A little help with walking and/or transfers;A little help with bathing/dressing/bathroom     Functional Status Assessment   Patient has had a recent decline in their functional status and demonstrates the ability to make significant improvements in function in a reasonable and predictable amount of time.     Equipment Recommendations   Tub/shower seat     Recommendations for Other Services         Precautions/Restrictions   Precautions Precautions: Fall Recall of Precautions/Restrictions: Intact Restrictions Weight Bearing Restrictions Per Provider Order: No     Mobility Bed Mobility Overal bed mobility: Needs Assistance Bed Mobility: Supine to Sit     Supine to sit: Supervision     General bed mobility comments: From a flat surface, minimal use of bed features    Transfers Overall transfer level: Needs  assistance Equipment used: Rolling walker (2 wheels), None Transfers: Sit to/from Stand, Bed to chair/wheelchair/BSC Sit to Stand: Supervision     Step pivot transfers: Contact guard assist, Supervision     General transfer comment: Pt's transfers improved to supervision with use of RW, without UE support pt is CGA for balance      Balance Overall balance assessment: Needs assistance Sitting-balance support: Bilateral upper extremity supported, Feet supported Sitting balance-Leahy Scale: Good     Standing balance support: Bilateral upper extremity supported, During functional activity, Reliant on assistive device for balance Standing balance-Leahy Scale: Fair Standing balance comment: With RW fair, without UE support poor         ADL either performed or assessed with clinical judgement   ADL Overall ADL's : Needs assistance/impaired Eating/Feeding: Set up;Sitting   Grooming: Wash/dry face;Contact guard assist;Standing Grooming Details (indicate cue type and reason): CGA for balance c/o of mild lightheadedness         Upper Body Dressing : Set up;Sitting   Lower Body Dressing: Contact guard assist;Sit to/from stand Lower Body Dressing Details (indicate cue type and reason): For dynamic standing balance Toilet Transfer: Contact guard assist;Ambulation;Regular Toilet;Grab bars;Rolling walker (2 wheels);Supervision/safety Toilet Transfer Details (indicate cue type and reason): To toilet pt used no AD, relied heavily on GBs, returning to room with use of RW, pt operating at supervision level Toileting- Clothing Manipulation and Hygiene: Supervision/safety;Sitting/lateral lean Toileting - Clothing Manipulation Details (indicate cue type and reason): Able to wipe while seated with lateral leans     Functional mobility during ADLs: Contact guard assist;Rolling walker (2  wheels);Supervision/safety General ADL Comments: Pt benefits from use of RW or external support to maintain  standing balance     Vision Baseline Vision/History: 6 Macular Degeneration Patient Visual Report: No change from baseline Vision Assessment?: No apparent visual deficits            Pertinent Vitals/Pain Pain Assessment Pain Assessment: Faces Faces Pain Scale: Hurts little more Pain Location: LUE & bottom Pain Descriptors / Indicators: Aching Pain Intervention(s): Repositioned     Extremity/Trunk Assessment Upper Extremity Assessment Upper Extremity Assessment: Generalized weakness;Left hand dominant;LUE deficits/detail LUE Deficits / Details: Pain in LUE and L side with ROM, overall WFL LUE: Shoulder pain with ROM   Lower Extremity Assessment Lower Extremity Assessment: Defer to PT evaluation   Cervical / Trunk Assessment Cervical / Trunk Assessment: Normal   Communication Communication Communication: No apparent difficulties   Cognition Arousal: Alert Behavior During Therapy: WFL for tasks assessed/performed Cognition: No apparent impairments     Following commands: Intact       Cueing  General Comments   Cueing Techniques: Verbal cues;Tactile cues  Pt c/o of lightheadedness after >5 minutes of standing, vitals recorded in flow sheets, see external note           Home Living Family/patient expects to be discharged to:: Assisted living       Home Equipment: Cane - single point;Grab bars - toilet;Grab bars - tub/shower   Additional Comments: 3 falls in the last month      Prior Functioning/Environment Prior Level of Function : Independent/Modified Independent       Mobility Comments: No AD ADLs Comments: Ind    OT Problem List: Decreased strength;Decreased range of motion;Decreased activity tolerance;Impaired balance (sitting and/or standing)   OT Treatment/Interventions: Self-care/ADL training;Therapeutic exercise;Energy conservation;DME and/or AE instruction;Therapeutic activities;Patient/family education;Balance training      OT  Goals(Current goals can be found in the care plan section)   Acute Rehab OT Goals Patient Stated Goal: To go back to ALF OT Goal Formulation: With patient Time For Goal Achievement: 01/02/24 Potential to Achieve Goals: Good   OT Frequency:  Min 2X/week    Co-evaluation PT/OT/SLP Co-Evaluation/Treatment: Yes Reason for Co-Treatment: For patient/therapist safety;To address functional/ADL transfers   OT goals addressed during session: ADL's and self-care;Strengthening/ROM      AM-PAC OT "6 Clicks" Daily Activity     Outcome Measure Help from another person eating meals?: None Help from another person taking care of personal grooming?: A Little Help from another person toileting, which includes using toliet, bedpan, or urinal?: A Little Help from another person bathing (including washing, rinsing, drying)?: A Little Help from another person to put on and taking off regular upper body clothing?: A Little Help from another person to put on and taking off regular lower body clothing?: A Little 6 Click Score: 19   End of Session Equipment Utilized During Treatment: Gait belt;Rolling walker (2 wheels) Nurse Communication: Mobility status  Activity Tolerance: Patient tolerated treatment well Patient left: in chair;with call bell/phone within reach;with chair alarm set  OT Visit Diagnosis: Unsteadiness on feet (R26.81);Other abnormalities of gait and mobility (R26.89);Muscle weakness (generalized) (M62.81);Repeated falls (R29.6);History of falling (Z91.81)                Time: 1610-9604 OT Time Calculation (min): 22 min Charges:  OT General Charges $OT Visit: 1 Visit OT Evaluation $OT Eval Low Complexity: 1 Low  Delmer Ferraris, OT  Acute Rehabilitation Services Office [phone] Secure chat preferred   Mickael Alamo  12/19/2023, 9:57 AM

## 2023-12-19 NOTE — TOC Progression Note (Signed)
 Transition of Care [facility]) - Progression Note    Patient Details  Name: Megan Olsen MRN: [id number] Date of Birth: 20-Jul-1928  Transition of Care [facility]) CM/SW Contact  Claudean Crumbly, LCSWA Phone Number: 12/19/2023, 11:46 AM  Clinical Narrative:      SW attempted to call [facility] [phone]) [phone]) no answer each time.   Per MD, not discharging due to Bps.      Expected Discharge Plan and Services                                               Social Determinants of Health (SDOH) Interventions SDOH Screenings   Food Insecurity: No Food Insecurity (12/18/2023)  Housing: Low Risk  (12/18/2023)  Transportation Needs: No Transportation Needs (12/18/2023)  Utilities: Not At Risk (12/18/2023)  Alcohol Screen: Low Risk  (09/26/2020)  Depression (PHQ2-9): Medium Risk (02/16/2022)  Financial Resource Strain: Low Risk  (09/02/2021)  Physical Activity: Inactive (09/02/2021)  Social Connections: Moderately Isolated (12/19/2023)  Stress: No Stress Concern Present (09/02/2021)  Tobacco Use: Low Risk  (12/18/2023)    Readmission Risk Interventions     No data to display

## 2023-12-19 NOTE — Progress Notes (Addendum)
 BP 105/53 HR 72, lying down.  Taye MD notified.  BP meds adjusted by MD.

## 2023-12-19 NOTE — Evaluation (Signed)
 Physical Therapy Evaluation Patient Details Name: Megan Olsen MRN: [id number] DOB: Mar 03, 1928 Today's Date: 12/19/2023  History of Present Illness  Pt is a 89 y.o. female admitted 5/31 from [facility] for fall. Reports 3rd fall of the month. CT head & abdomen showed no acute changes. Further imaging showed no acute injuries, however hematoma on L pelvis.  PMH: macular degeneration, a-fib, HTN, CVA, OA, osteoporosis  Clinical Impression   Pt admitted with above diagnosis. Lives at ALF, ilikes to sit on the front porch; Prior to admission, pt was able to walk without an assistive device; Presents to PT with generalized weakness, incr fall risk; Did not get a fully porper set of orthostatic vitals, but noteworthy that she had dizziness with BP taken in standing, which was lower than her BP taken sitting, (no dizziness); see vitals flowsheets for details;  At this time, recommend RW for bil UE support with ambulation, and recommend HHPT/OT follow up at ALF; Pt currently with functional limitations due to the deficits listed below (see PT Problem List). Pt will benefit from skilled PT to increase their independence and safety with mobility to allow discharge to the venue listed below.           If plan is discharge home, recommend the following: A little help with walking and/or transfers;Assistance with cooking/housework   Can travel by private vehicle        Equipment Recommendations Rolling walker (2 wheels);BSC/3in1  Recommendations for Other Services  Other (comment) (TOC: rec HHPT/OT followup at ALF)    Functional Status Assessment Patient has had a recent decline in their functional status and demonstrates the ability to make significant improvements in function in a reasonable and predictable amount of time.     Precautions / Restrictions Precautions Precautions: Fall Recall of Precautions/Restrictions: Intact Restrictions Weight Bearing Restrictions Per Provider  Order: No      Mobility  Bed Mobility Overal bed mobility: Needs Assistance Bed Mobility: Supine to Sit     Supine to sit: Supervision     General bed mobility comments: From a flat surface, minimal use of bed features    Transfers Overall transfer level: Needs assistance Equipment used: Rolling walker (2 wheels), None Transfers: Sit to/from Stand, Bed to chair/wheelchair/BSC Sit to Stand: Supervision   Step pivot transfers: Contact guard assist, Supervision       General transfer comment: Noting better rise with use of armrests to push off from with UEs; Pt's transfers improved to supervision with use of RW, without UE support pt is CGA for balance    Ambulation/Gait Ambulation/Gait assistance: Contact guard assist Gait Distance (Feet): 20 Feet (to an dfrom bathroom) Assistive device: None, Rolling walker (2 wheels) Gait Pattern/deviations: Step-through pattern, Decreased step length - right, Decreased step length - left       General Gait Details: walked to the bathroom without assistive device, and very slow an dunsteady; naturally reaching out for UE support and needing very close guard for balance; walked back from bathroom with RW and much steadier with UE support  Stairs            Wheelchair Mobility     Tilt Bed    Modified Rankin (Stroke Patients Only)       Balance Overall balance assessment: Needs assistance Sitting-balance support: Bilateral upper extremity supported, Feet supported Sitting balance-Leahy Scale: Good     Standing balance support: Bilateral upper extremity supported, During functional activity, Reliant on assistive device for balance Standing balance-Leahy  Scale: Fair Standing balance comment: With RW fair, without UE support poor                             Pertinent Vitals/Pain Pain Assessment Faces Pain Scale: Hurts little more Pain Location: LUE & bottom Pain Descriptors / Indicators: Aching    Home  Living Family/patient expects to be discharged to:: Assisted living                 Home Equipment: Cane - single point;Grab bars - toilet;Grab bars - tub/shower Additional Comments: 3 falls in the last month    Prior Function Prior Level of Function : Independent/Modified Independent             Mobility Comments: No AD ADLs Comments: Ind     Extremity/Trunk Assessment   Upper Extremity Assessment Upper Extremity Assessment: Defer to OT evaluation LUE Deficits / Details: Pain in LUE and L side with ROM, overall WFL LUE: Shoulder pain with ROM    Lower Extremity Assessment Lower Extremity Assessment: Generalized weakness (RLE with dressing medial aspect of lower leg)    Cervical / Trunk Assessment Cervical / Trunk Assessment: Normal  Communication   Communication Communication: No apparent difficulties    Cognition Arousal: Alert Behavior During Therapy: WFL for tasks assessed/performed                           PT - Cognition Comments: Unable to remember circumstances around her most recent fall Following commands: Intact       Cueing Cueing Techniques: Verbal cues, Tactile cues     General Comments General comments (skin integrity, edema, etc.): Pt c/o of lightheadedness after >5 minutes of standing, vitals recorded in flow sheets, see external note by OT of this date    Exercises     Assessment/Plan    PT Assessment Patient needs continued PT services  PT Problem List Decreased strength;Decreased activity tolerance;Decreased balance;Decreased mobility;Decreased coordination;Decreased cognition;Decreased knowledge of use of DME;Decreased safety awareness;Decreased knowledge of precautions;Cardiopulmonary status limiting activity;Pain       PT Treatment Interventions DME instruction;Gait training;Stair training;Functional mobility training;Therapeutic activities;Therapeutic exercise;Balance training;Cognitive remediation;Patient/family  education;Manual techniques    PT Goals (Current goals can be found in the Care Plan section)  Acute Rehab PT Goals Patient Stated Goal: get back to [facility] PT Goal Formulation: With patient Time For Goal Achievement: 01/02/24 Potential to Achieve Goals: Good    Frequency Min 3X/week     Co-evaluation   Reason for Co-Treatment: For patient/therapist safety;To address functional/ADL transfers   OT goals addressed during session: ADL's and self-care;Strengthening/ROM       AM-PAC PT "6 Clicks" Mobility  Outcome Measure Help needed turning from your back to your side while in a flat bed without using bedrails?: None Help needed moving from lying on your back to sitting on the side of a flat bed without using bedrails?: A Little Help needed moving to and from a bed to a chair (including a wheelchair)?: A Little Help needed standing up from a chair using your arms (e.g., wheelchair or bedside chair)?: A Little Help needed to walk in hospital room?: A Little Help needed climbing 3-5 steps with a railing? : A Little 6 Click Score: 19    End of Session Equipment Utilized During Treatment: Gait belt Activity Tolerance: Patient tolerated treatment well;Other (comment) (though noted lightheadedness) Patient left: in chair;with call bell/phone within  reach;with chair alarm set Nurse Communication: Mobility status PT Visit Diagnosis: Unsteadiness on feet (R26.81);Muscle weakness (generalized) (M62.81)    Time: 6213-0865 PT Time Calculation (min) (ACUTE ONLY): 22 min   Charges:   PT Evaluation $PT Eval Low Complexity: 1 Low   PT General Charges $$ ACUTE PT VISIT: 1 Visit         Darcus Eastern, PT  Acute Rehabilitation Services Office [phone] Secure Chat welcomed   Marcial Setting 12/19/2023, 11:53 AM

## 2023-12-19 NOTE — Care Management Obs Status (Signed)
 MEDICARE OBSERVATION STATUS NOTIFICATION   Patient Details  Name: Megan Olsen MRN: [id number] Date of Birth: 1927-08-28   Medicare Observation Status Notification Given:  Yes    Jannine Meo, RN 12/19/2023, 7:43 AM

## 2023-12-20 ENCOUNTER — Observation Stay (HOSPITAL_BASED_OUTPATIENT_CLINIC_OR_DEPARTMENT_OTHER)

## 2023-12-20 DIAGNOSIS — I951 Orthostatic hypotension: Secondary | ICD-10-CM | POA: Diagnosis not present

## 2023-12-20 DIAGNOSIS — D649 Anemia, unspecified: Secondary | ICD-10-CM

## 2023-12-20 DIAGNOSIS — S300XXA Contusion of lower back and pelvis, initial encounter: Secondary | ICD-10-CM | POA: Diagnosis not present

## 2023-12-20 DIAGNOSIS — I5032 Chronic diastolic (congestive) heart failure: Secondary | ICD-10-CM | POA: Diagnosis not present

## 2023-12-20 DIAGNOSIS — I071 Rheumatic tricuspid insufficiency: Secondary | ICD-10-CM | POA: Insufficient documentation

## 2023-12-20 DIAGNOSIS — I340 Nonrheumatic mitral (valve) insufficiency: Secondary | ICD-10-CM | POA: Insufficient documentation

## 2023-12-20 DIAGNOSIS — T148XXA Other injury of unspecified body region, initial encounter: Secondary | ICD-10-CM | POA: Diagnosis not present

## 2023-12-20 DIAGNOSIS — W19XXXA Unspecified fall, initial encounter: Secondary | ICD-10-CM | POA: Diagnosis not present

## 2023-12-20 DIAGNOSIS — I350 Nonrheumatic aortic (valve) stenosis: Secondary | ICD-10-CM

## 2023-12-20 LAB — RENAL FUNCTION PANEL
Albumin: 2.3 g/dL — ABNORMAL LOW (ref 3.5–5.0)
Anion gap: 7 (ref 5–15)
BUN: 15 mg/dL (ref 8–23)
CO2: 22 mmol/L (ref 22–32)
Calcium: 7.7 mg/dL — ABNORMAL LOW (ref 8.9–10.3)
Chloride: 102 mmol/L (ref 98–111)
Creatinine, Ser: 0.73 mg/dL (ref 0.44–1.00)
GFR, Estimated: >60 mL/min (ref >60)
Glucose, Bld: 87 mg/dL (ref 70–99)
Phosphorus: 3.5 mg/dL (ref 2.5–4.6)
Potassium: 4.5 mmol/L (ref 3.5–5.1)
Sodium: 131 mmol/L — ABNORMAL LOW (ref 135–145)

## 2023-12-20 LAB — ECHOCARDIOGRAM COMPLETE
AR max vel: 1.88 cm2
AV Area VTI: 1.87 cm2
AV Area mean vel: 1.93 cm2
AV Mean grad: 6 mmHg
AV Peak grad: 11.2 mmHg
Ao pk vel: 1.67 m/s
Area-P 1/2: 3.27 cm2
Height: 65 in
MV M vel: 5.59 m/s
MV Peak grad: 125 mmHg
MV VTI: 1.8 cm2
Radius: 0.7 cm
S' Lateral: 2.8 cm

## 2023-12-20 LAB — CBC
HCT: 25.4 % — ABNORMAL LOW (ref 36.0–46.0)
Hemoglobin: 8.3 g/dL — ABNORMAL LOW (ref 12.0–15.0)
MCH: 31.8 pg (ref 26.0–34.0)
MCHC: 32.7 g/dL (ref 30.0–36.0)
MCV: 97.3 fL (ref 80.0–100.0)
Platelets: 209 10*3/uL (ref 150–400)
RBC: 2.61 MIL/uL — ABNORMAL LOW (ref 3.87–5.11)
RDW: 14.4 % (ref 11.5–15.5)
WBC: 6.1 10*3/uL (ref 4.0–10.5)
nRBC: 0 % (ref 0.0–0.2)

## 2023-12-20 LAB — MAGNESIUM: Magnesium: 2 mg/dL (ref 1.7–2.4)

## 2023-12-20 MED ORDER — FUROSEMIDE 20 MG PO TABS: 20.0000 mg | ORAL_TABLET | Freq: Every day | ORAL | 0 refills | Status: DC | PRN

## 2023-12-20 MED ORDER — CYANOCOBALAMIN 1000 MCG PO TABS: 1000.0000 ug | ORAL_TABLET | Freq: Every day | ORAL | 0 refills | Status: DC

## 2023-12-20 MED ORDER — CYANOCOBALAMIN 1000 MCG/ML IJ SOLN
1000.0000 ug | Freq: Once | INTRAMUSCULAR | Status: DC
  Filled 2023-12-20: qty 1

## 2023-12-20 MED ORDER — VITAMIN B-12 1000 MCG PO TABS
1000.0000 ug | ORAL_TABLET | Freq: Every day | ORAL | Status: DC
  Filled 2023-12-20: qty 1

## 2023-12-20 MED ORDER — METOPROLOL TARTRATE 25 MG PO TABS: 12.5000 mg | ORAL_TABLET | Freq: Two times a day (BID) | ORAL | 0 refills | Status: DC

## 2023-12-20 NOTE — Progress Notes (Signed)
 Mobility Specialist Progress Note:   12/20/23 0930  Mobility  Activity Ambulated with assistance in hallway  Level of Assistance Contact guard assist, steadying assist  Assistive Device Front wheel walker  Distance Ambulated (ft) 200 ft  Activity Response Tolerated well  Mobility Referral Yes  Mobility visit 1 Mobility  Mobility Specialist Start Time (ACUTE ONLY) 0915  Mobility Specialist Stop Time (ACUTE ONLY) [id number]  Mobility Specialist Time Calculation (min) (ACUTE ONLY) 12 min   Pt received in bed, agreeable to mobility. No physical assistance during session. No c/o throughout. Returned to room w/o fault. Left in chair w/ call bell and personal belongings in reach. All needs met. Chair alarm on.  Pre Mobility supine BP 126/55 During Mobility sitting BP 128/73 Post Mobility sitting 137/78  Inetta Manes Mobility Specialist  Please contact vis Secure Chat or  Rehab Office [phone]

## 2023-12-20 NOTE — Progress Notes (Signed)
 Physical Therapy Treatment Patient Details Name: Megan Olsen MRN: [id number] DOB: [birth year] Today's Date: 12/20/2023   History of Present Illness Pt is a 89 y.o. female admitted 5/31 from [facility] for fall. Reports 3rd fall of the month. CT head & abdomen showed no acute changes. Further imaging showed no acute injuries, however hematoma on L pelvis.  PMH: macular degeneration, a-fib, HTN, CVA, OA, osteoporosis    PT Comments  Continuing work on functional mobility and activity tolerance;  Session focused on progressive amb, with noted much improved steadiness and gait distance; no dizziness reported; Pt nervous about how she will get back to [facility]    If plan is discharge home, recommend the following: A little help with walking and/or transfers;Assistance with cooking/housework   Can travel by private vehicle        Equipment Recommendations  Rolling walker (2 wheels);BSC/3in1    Recommendations for Other Services Other (comment) (TOC: rec HHPT/OT followup at ALF)     Precautions / Restrictions Precautions Precautions: Fall Recall of Precautions/Restrictions: Intact Restrictions Weight Bearing Restrictions Per Provider Order: No     Mobility  Bed Mobility               General bed mobility comments: OOB in recliner upon entry    Transfers Overall transfer level: Needs assistance Equipment used: Rolling walker (2 wheels), None Transfers: Sit to/from Stand Sit to Stand: Supervision           General transfer comment: for safety, increased time    Ambulation/Gait Ambulation/Gait assistance: Supervision Gait Distance (Feet): 90 Feet Assistive device: Rolling walker (2 wheels) Gait Pattern/deviations: Step-through pattern       General Gait Details: More steady with RW; no reports of lightheadedness or feeling like she would pass out   Stairs             Wheelchair Mobility     Tilt Bed    Modified Rankin (Stroke  Patients Only)       Balance Overall balance assessment: Needs assistance Sitting-balance support: Bilateral upper extremity supported, Feet supported Sitting balance-Leahy Scale: Good     Standing balance support: No upper extremity supported, During functional activity, Bilateral upper extremity supported, Reliant on assistive device for balance Standing balance-Leahy Scale: Fair Standing balance comment: relies on RW dynamically but able to manage without UE support statically and supervision                            Communication Communication Communication: No apparent difficulties  Cognition Arousal: Alert Behavior During Therapy: Anxious                             Following commands: Intact      Cueing Cueing Techniques: Verbal cues  Exercises      General Comments General comments (skin integrity, edema, etc.): BP not orthostatic today, pt anxious throughout session; see vitals flowsheets for BP values      Pertinent Vitals/Pain Pain Assessment Pain Assessment: Faces Faces Pain Scale: Hurts a little bit Pain Location: L hip/bottom Pain Descriptors / Indicators: Aching Pain Intervention(s): Monitored during session    Home Living                          Prior Function            PT Goals (current goals can  now be found in the care plan section) Acute Rehab PT Goals Patient Stated Goal: get back to [facility] PT Goal Formulation: With patient Time For Goal Achievement: 01/02/24 Potential to Achieve Goals: Good Progress towards PT goals: Progressing toward goals    Frequency    Min 3X/week      PT Plan      Co-evaluation              AM-PAC PT "6 Clicks" Mobility   Outcome Measure  Help needed turning from your back to your side while in a flat bed without using bedrails?: None Help needed moving from lying on your back to sitting on the side of a flat bed without using bedrails?: A Little Help  needed moving to and from a bed to a chair (including a wheelchair)?: A Little Help needed standing up from a chair using your arms (e.g., wheelchair or bedside chair)?: A Little Help needed to walk in hospital room?: A Little Help needed climbing 3-5 steps with a railing? : A Little 6 Click Score: 19    End of Session Equipment Utilized During Treatment: Gait belt Activity Tolerance: Patient tolerated treatment well Patient left: in chair;with call bell/phone within reach;with chair alarm set Nurse Communication: Mobility status PT Visit Diagnosis: Unsteadiness on feet (R26.81);Muscle weakness (generalized) (M62.81)     Time: 1610-9604 PT Time Calculation (min) (ACUTE ONLY): 11 min  Charges:    $Gait Training: 8-22 mins PT General Charges $$ ACUTE PT VISIT: 1 Visit                     Darcus Eastern, PT  Acute Rehabilitation Services Office [phone] Secure Chat welcomed    Marcial Setting 12/20/2023, 2:36 PM

## 2023-12-20 NOTE — TOC CM/SW Note (Signed)
 Patient active with [facility]. Brandi at [facility] requesting ordrs to continue home health services and face to face.   Patient needing rolling walker and 3 in1 delivered to [place] at [place]. Entered orders and DME note for MD to sign. Ordered DME with Raechel Bulla with [employer]   Secure chatted MD to sign orders, face to face and DME note

## 2023-12-20 NOTE — Progress Notes (Signed)
 Occupational Therapy Treatment Patient Details Name: Megan Olsen MRN: [id number] DOB: 1927/11/16 Today's Date: 12/20/2023   History of present illness Pt is a 89 y.o. female admitted 5/31 from [facility] for fall. Reports 3rd fall of the month. CT head & abdomen showed no acute changes. Further imaging showed no acute injuries, however hematoma on L pelvis.  PMH: macular degeneration, a-fib, HTN, CVA, OA, osteoporosis   OT comments  Pt in recliner and agreeable to OT session.  Pt anxious about dc home today, worried about how she is going to get there and manage when she gets back.   Pt mobilizing well today with RW, supervision for transfers and ADLS using RW.  Discussed fall prevention, safety and recommendations and pt voiced understanding.  Not orthostatic during mobility today, BP actually a little elevated (likely due to stress/anxiety). Pt reports feeling better about plan after session, continue to recommend HHOT at dc.  Will follow acutely.       If plan is discharge home, recommend the following:  A little help with walking and/or transfers;A little help with bathing/dressing/bathroom   Equipment Recommendations  BSC/3in1    Recommendations for Other Services      Precautions / Restrictions Precautions Precautions: Fall Recall of Precautions/Restrictions: Intact Restrictions Weight Bearing Restrictions Per Provider Order: No       Mobility Bed Mobility               General bed mobility comments: OOB in recliner upon entry    Transfers Overall transfer level: Needs assistance Equipment used: Rolling walker (2 wheels), None Transfers: Sit to/from Stand Sit to Stand: Supervision           General transfer comment: for safety, increased time     Balance Overall balance assessment: Needs assistance Sitting-balance support: Bilateral upper extremity supported, Feet supported Sitting balance-Leahy Scale: Good     Standing balance support:  No upper extremity supported, During functional activity, Bilateral upper extremity supported, Reliant on assistive device for balance Standing balance-Leahy Scale: Fair Standing balance comment: relies on RW dynamically but able to manage without UE support statically and supervision                           ADL either performed or assessed with clinical judgement   ADL Overall ADL's : Needs assistance/impaired                 Upper Body Dressing : Set up;Sitting   Lower Body Dressing: Supervision/safety;Sit to/from stand   Toilet Transfer: Supervision/safety;Ambulation Toilet Transfer Details (indicate cue type and reason): RW         Functional mobility during ADLs: Supervision/safety;Rolling walker (2 wheels) General ADL Comments: Discussed safety with using [place] in shower, having support from staff as needed, also discussed energy conservation, chair placement and fall prevention    Extremity/Trunk Assessment              Vision       Perception     Praxis     Communication Communication Communication: No apparent difficulties   Cognition Arousal: Alert Behavior During Therapy: Anxious Cognition: No apparent impairments             OT - Cognition Comments: anxious about return to ALF, not sure how she will get there or make manage when she gets there.  provided reinforcement that she is doing much better than when she was admitted, and provided education  of safety at home.                 Following commands: Intact        Cueing   Cueing Techniques: Verbal cues  Exercises      Shoulder Instructions       General Comments BP not orthostatic today, pt anxious throughout sessioin    Pertinent Vitals/ Pain       Pain Assessment Pain Assessment: Faces Faces Pain Scale: Hurts a little bit Pain Location: L hip/bottom Pain Descriptors / Indicators: Aching Pain Intervention(s): Limited activity within patient's tolerance,  Monitored during session, Repositioned  Home Living                                          Prior Functioning/Environment              Frequency  Min 2X/week        Progress Toward Goals  OT Goals(current goals can now be found in the care plan section)  Progress towards OT goals: Progressing toward goals  Acute Rehab OT Goals Patient Stated Goal: get better OT Goal Formulation: With patient Time For Goal Achievement: 01/02/24 Potential to Achieve Goals: Good  Plan      Co-evaluation                 AM-PAC OT "6 Clicks" Daily Activity     Outcome Measure   Help from another person eating meals?: None Help from another person taking care of personal grooming?: A Little Help from another person toileting, which includes using toliet, bedpan, or urinal?: A Little Help from another person bathing (including washing, rinsing, drying)?: A Little Help from another person to put on and taking off regular upper body clothing?: A Little Help from another person to put on and taking off regular lower body clothing?: A Little 6 Click Score: 19    End of Session Equipment Utilized During Treatment: Rolling walker (2 wheels)  OT Visit Diagnosis: Unsteadiness on feet (R26.81);Other abnormalities of gait and mobility (R26.89);Muscle weakness (generalized) (M62.81);Repeated falls (R29.6);History of falling (Z91.81)   Activity Tolerance Patient tolerated treatment well   Patient Left in chair;with call bell/phone within reach;with chair alarm set   Nurse Communication Mobility status        Time: 6962-9528 OT Time Calculation (min): 14 min  Charges: OT General Charges $OT Visit: 1 Visit OT Treatments $Self Care/Home Management : 8-22 mins  Bary Boss, OT Acute Rehabilitation Services Office [phone] Secure Chat Preferred    Fredrich Jefferson 12/20/2023, 1:20 PM

## 2023-12-20 NOTE — Discharge Summary (Addendum)
 Physician Discharge Summary  Megan Olsen ZOX:[id number] DOB: [birth year] DOA: 12/18/2023  PCP: Aida House, MD  Admit date: 12/18/2023 Discharge date: 12/20/23  Admitted From: ALF Disposition: ALF Recommendations for Outpatient Follow-up:  Follow up with PCP in 1 week Check labs (CBC and CMP) in 3 to 4 days Reassess blood pressure and adjust meds as appropriate Consider palliative consult outpatient  Please follow up on the following pending results: None  Home Health: [facility] PT/OT/RN Equipment/Devices: Rolling walker and 3 in 1 commode  Discharge Condition: Stable CODE STATUS: DNR  Follow-up Information     Aida House, MD. Schedule an appointment as soon as possible for a visit in 1 week(s).   Specialty: Family Medicine Contact information: [address] [place] [address] [phone]                 Hospital course 89 year old F with PMH of CVA, CAD/non-STEMI, A-fib on Eliquis , SSS/PPM, right THA, chronic IDA, HTN, HLD, GERD, hypothyroidism, anxiety and depression brought to ED from [facility] due to ground-level fall.  Patient was in his usual state of health when she fell back on the shower stool knobs as she tried to reach for a towel, and sustained pain, bruising and hematoma to left buttock.  Did not hit head or lose consciousness.   In ED, stable vitals.  Na 132.  Hgb 8.5 (was 11.7 in 07/2023).  Hemoccult negative.  CT head without acute finding.  EKG junctional rhythm with IVCD and QTc of 530 but in the setting of wide QRS, similar to prior EKG in 2024.   CT abdomen and pelvis showed T11/L1 endplate compression fractures, progressive intrahepatic and extrahepatic biliary ductal dilation with CBD dilated to 18.9 mm, marked cardiomegaly with interval increase in size and cylindrical and cystic bronchiectasis with associated parenchymal densities in RML and lingula that can be seen in chronic atypical infection such as MAI.  Patient has  no respiratory distress or cough.   Bilateral lower extremity CT with contrast showed 5.1 x 3.7 x 5.6 cm mass in inferior left gluteal musculature which could be intramuscular hematoma, subcutaneous edema/hemorrhage in the inferior left gluteal region deep to the skin.  Orthostatic vitals positive but patient was not symptomatic.   Patient's hemoglobin remained stable.  Recommend holding Eliquis  for 3 to 4 days after discharge.   Patient's TTE with LVEF of 60 to 65%, G3DD, severe LAE and RAE, RVSP of 37 mmHg, severe MR, severe TR and moderate aortic stenosis.  Patient has no signs of CHF decompensation and even tolerated IV fluid for hydration.  Not a candidate for surgical intervention given her age and frailty.  She is discharged on p.o. Lasix  as needed for fluid or edema.  Discontinued home antihypertensive meds given preload dependence.    On the day of discharge, patient ambulated about 200 feet with mobility.  Home health and DME ordered as recommended by therapy.  See individual problem list below for more.   Problems addressed during this hospitalization Ground-level fall at ALF-looks mechanical but she has orthostatic hypotension as well.  Also on significant dose of trazodone  and Ativan  which could put her at risk of polypharmacy.  Echocardiogram as above. -Discontinued Ativan  and trazodone .  Did not require in the hospital. -Adjusted antihypertensive meds. -Continue fall precaution, PT/OT   Orthostatic hypotension/history of hypertension: BP improved after holding antihypertensive meds.  Significant cardiomegaly on imaging.  Appears euvolemic on exam.  Denies cardiopulmonary symptoms.  TTE with significant  change from prior in 2020.  TSH and cortisol normal.  Does not seem to be symptomatic from orthostatic hypotension. -P.o. Lasix  20 mg as needed -Discontinue losartan  and HCTZ. -Changed bisoprolol  to low-dose metoprolol   Chronic HFpEF/severe mitral and tricuspid valve  regurgitation/moderate aortic valve stenosis: Appears euvolemic.  No cardiopulmonary symptoms.  Patient understand that she is not a candidate for surgical intervention. -P.o. Lasix  20 mg daily as needed -Would benefit from outpatient palliative.   Left gluteal hematoma/left gluteal and left thigh ecchymosis: Likely due to the above.  CT as above.  She has palpable tennis ball sized deep hematoma in inferior left buttock.  Hgb stable. -Hold Eliquis  for 3 to 4 days -Recheck CBC in 3 to 4 days   T11/L1 endplate compression fractures: With 30% and 10% height loss.  No retropulsion.  Not in pain. Osteoporosis-vitamin D  58. -Fall precaution, PT/OT   Normocytic anemia: Hgb stable even after IV fluid.  Anemia panel with B12 deficiency. -Received vitamin B12 injection 1000 mcg x 1 -Discharged on p.o. vitamin B12 1000 mcg daily.   Paroxysmal A-fib: EKG with junctional rhythm/IVCD.  She is on low-dose Ziac  - Changed bisoprolol  to metoprolol  - Hold Eliquis  for 3 to 4 days in the setting of hematoma   At risk for polypharmacy: Seems to take trazodone  100 mg daily and Ativan  1 mg daily.  Maybe does not need antihypertensive meds as well. - Discontinued Ativan  and trazodone .   Hyponatremia: Mild. Stable.  Discontinued HCTZ.   SSS s/p PPM  Left upper extremity and right lower extremity skin laceration -Wound care as below. -HH RN.    Nutrition                    Time spent 35 minutes  Vital signs Vitals:   12/19/23 1624 12/19/23 1947 12/20/23 0520 12/20/23 0810  BP: 128/60 (!) 129/58 (!) 141/62 (!) 146/63  Pulse: 61 60 66   Temp: (!) 97.4 F (36.3 C) (!) 97.4 F (36.3 C) (!) 97.4 F (36.3 C) (!) 97.5 F (36.4 C)  Resp: 16 17  17   Height:      SpO2: 100% 100% 99% 98%  TempSrc: Oral Oral Oral      Discharge exam  GENERAL: No apparent distress.  Nontoxic. HEENT: MMM.  Vision and hearing grossly intact.  NECK: Supple.  No apparent JVD.  RESP:  No IWOB.  Fair aeration  bilaterally. CVS:  RRR. Heart sounds normal.  ABD/GI/GU: BS+. Abd soft, NTND.  MSK/EXT:  Moves extremities. No apparent deformity. No edema.  Palpable tennis ball sized hematoma in left buttock SKIN: Ecchymosis over posterior aspect of left thigh.  Skin laceration in LUE and RLE. NEURO: AA.  Oriented appropriately.  No apparent focal neuro deficit. PSYCH: Calm. Normal affect.   Discharge Instructions Discharge Instructions     Diet - low sodium heart healthy   Complete by: As directed    Discharge wound care:   Complete by: As directed    12/18/23 2100    Wound care  Every other day    Comments: Cleanse R arm skin tear with NS, apply Vaseline gauze (Lawson #239) to wound bed every other day, cover with silicone foam or Telfa nonstick gauze and Kerlix roll gauze whichever is preferred. Change every other day.  12/18/23 1845   Wound care  Daily      Comments: Cleanse R lower leg wound with NS, apply Medihoney to wound bed daily, cover with dry gauze and secure  with silicone foam or ABD pad and Kerlix roll gauze whichever is preferred   Increase activity slowly   Complete by: As directed       Allergies as of 12/20/2023       Reactions   Darvon [propoxyphene] Nausea Only   Propoxyphene N-acetaminophen  Nausea Only   Allegra [fexofenadine] Rash   Celebrex [celecoxib] Itching, Rash   Penicillins Hives, Rash        Medication List     PAUSE taking these medications    Eliquis  2.5 MG Tabs tablet Wait to take this until: December 23, 2023 Generic drug: apixaban  TAKE 1 TABLET TWICE DAILY       STOP taking these medications    benzonatate  100 MG capsule Commonly known as: TESSALON    bisoprolol -hydrochlorothiazide  2.5-6.25 MG tablet Commonly known as: ZIAC    LORazepam  1 MG tablet Commonly known as: ATIVAN    losartan  25 MG tablet Commonly known as: COZAAR    traZODone  100 MG tablet Commonly known as: DESYREL        TAKE these medications    acetaminophen  325 MG  tablet Commonly known as: TYLENOL  Take 650 mg by mouth every 8 (eight) hours as needed for moderate pain (pain score 4-6) or mild pain (pain score 1-3).   cyanocobalamin  1000 MCG tablet Take 1 tablet (1,000 mcg total) by mouth daily. Start taking on: December 21, 2023   dexlansoprazole  60 MG capsule Commonly known as: DEXILANT  TAKE 1 CAPSULE EVERY DAY   fluticasone  50 MCG/ACT nasal spray Commonly known as: FLONASE  PLACE 2 SPRAYS INTO BOTH NOSTRILS DAILY.   furosemide  20 MG tablet Commonly known as: Lasix  Take 1 tablet (20 mg total) by mouth daily as needed for fluid or edema.   GERI-TUSSIN PO Take 10 mLs by mouth every 6 (six) hours as needed (cough).   LACTOBACILLUS PO Take 1 capsule by mouth 3 (three) times daily. For 15 days, 5/19-12/20/23.   loperamide 2 MG tablet Commonly known as: IMODIUM A-D Take 2 mg by mouth every 8 (eight) hours as needed for diarrhea or loose stools.   memantine 5 MG tablet Commonly known as: NAMENDA Take 5 mg by mouth 2 (two) times daily.   metoprolol  tartrate 25 MG tablet Commonly known as: LOPRESSOR  Take 0.5 tablets (12.5 mg total) by mouth 2 (two) times daily.   nitroGLYCERIN  0.4 MG SL tablet Commonly known as: NITROSTAT  Place 1 tablet (0.4 mg total) under the tongue every 5 (five) minutes x 3 doses as needed for chest pain.   PRESERVISION AREDS 2 PO Take 1 capsule by mouth 2 (two) times daily.   Simethicone  125 MG Caps Take 125 mg by mouth every 6 (six) hours as needed (indigestion).               Durable Medical Equipment  (From admission, onward)           Start     Ordered   12/20/23 1320  For home use only DME Walker rolling  Once       Question Answer Comment  Walker: With 5 Inch Wheels   Patient needs a walker to treat with the following condition Weakness      12/20/23 1319   12/20/23 1320  For home use only DME 3 n 1  Once        12/20/23 1319              Discharge Care Instructions  (From admission,  onward)  Start     Ordered   12/20/23 0000  Discharge wound care:       Comments: 12/18/23 2100    Wound care  Every other day    Comments: Cleanse R arm skin tear with NS, apply Vaseline gauze (Lawson #239) to wound bed every other day, cover with silicone foam or Telfa nonstick gauze and Kerlix roll gauze whichever is preferred. Change every other day.  12/18/23 1845   Wound care  Daily      Comments: Cleanse R lower leg wound with NS, apply Medihoney to wound bed daily, cover with dry gauze and secure with silicone foam or ABD pad and Kerlix roll gauze whichever is preferred   12/20/23 1313            Consultations: None  Procedures/Studies:   ECHOCARDIOGRAM COMPLETE Result Date: 12/20/2023    ECHOCARDIOGRAM REPORT   Patient Name:   Megan Olsen Date of Exam: 12/20/2023 Medical Rec #:  [id number]       Height:       65.0 in Accession #:    [id number]      Weight:       119.0 lb Date of Birth:  [birth year]       BSA:          1.587 m Patient Age:    89 years        BP:           141/62 mmHg Patient Gender: F               HR:           50 bpm. Exam Location:  Inpatient Procedure: 2D Echo, 3D Echo, Color Doppler and Cardiac Doppler (Both Spectral            and Color Flow Doppler were utilized during procedure). Indications:    Anemia D64.9  History:        Patient has prior history of Echocardiogram examinations, most                 recent 02/17/2019. CAD, Pacemaker, Arrythmias:Atrial Fibrillation                 and RBBB; Risk Factors:Hypertension.  Sonographer:    Sherline Distel Senior RDCS Referring Phys: [id number] Ella Gun Paelyn Smick IMPRESSIONS  1. Left ventricular ejection fraction, by estimation, is 60 to 65%. The left ventricle has normal function. The left ventricle has no regional wall motion abnormalities. Left ventricular diastolic parameters are consistent with Grade III diastolic dysfunction (restrictive).  2. Right ventricular systolic function is mildly reduced. The right  ventricular size is mildly enlarged. There is mildly elevated pulmonary artery systolic pressure. The estimated right ventricular systolic pressure is 36.9 mmHg.  3. Left atrial size was severely dilated.  4. Right atrial size was severely dilated.  5. The mitral valve is abnormal. Severe mitral valve regurgitation. There is mild prolapse of mitral valve leaflets. The mean mitral valve gradient is 2.0 mmHg with average heart rate of 50 bpm.  6. The tricuspid valve is abnormal. Tricuspid valve regurgitation is severe.  7. Aortic valve gradients underestimated due to reduced stroke volume, likely moderate aortic stenosis by valve area of 1.21cm^2 and dimenionless index of 0.41. The aortic valve is severely calcified. Aortic valve regurgitation is not visualized. Moderate aortic valve stenosis.  8. The inferior vena cava is normal in size with <50% respiratory variability, suggesting right atrial pressure of 8 mmHg. FINDINGS  Left Ventricle:  Left ventricular ejection fraction, by estimation, is 60 to 65%. The left ventricle has normal function. The left ventricle has no regional wall motion abnormalities. The left ventricular internal cavity size was normal in size. There is  no left ventricular hypertrophy. Left ventricular diastolic parameters are consistent with Grade III diastolic dysfunction (restrictive). Right Ventricle: The right ventricular size is mildly enlarged. No increase in right ventricular wall thickness. Right ventricular systolic function is mildly reduced. There is mildly elevated pulmonary artery systolic pressure. The tricuspid regurgitant  velocity is 2.69 m/s, and with an assumed right atrial pressure of 8 mmHg, the estimated right ventricular systolic pressure is 36.9 mmHg. Left Atrium: Left atrial size was severely dilated. Right Atrium: Right atrial size was severely dilated. Pericardium: There is no evidence of pericardial effusion. Mitral Valve: The mitral valve is abnormal. There is  prolapse of both leaflets of the mitral valve. Severe mitral valve regurgitation, with centrally-directed jet. MV peak gradient, 9.4 mmHg. The mean mitral valve gradient is 2.0 mmHg with average heart rate of 50 bpm. Tricuspid Valve: The tricuspid valve is abnormal. Tricuspid valve regurgitation is severe. The flow in the hepatic veins is reversed during ventricular systole. Aortic Valve: Aortic valve gradients underestimated due to reduced stroke volume, likely moderate aortic stenosis by valve area of 1.21cm^2 and dimenionless index of 0.41. The aortic valve is calcified. Aortic valve regurgitation is not visualized. Moderate aortic stenosis is present. Aortic valve mean gradient measures 6.0 mmHg. Aortic valve peak gradient measures 11.2 mmHg. Aortic valve area, by VTI measures 1.87 cm. Pulmonic Valve: The pulmonic valve was normal in structure. Pulmonic valve regurgitation is trivial. Aorta: The aortic root and ascending aorta are structurally normal, with no evidence of dilitation. Venous: The inferior vena cava is normal in size with less than 50% respiratory variability, suggesting right atrial pressure of 8 mmHg. The inferior vena cava and the hepatic vein show a pattern of systolic flow reversal, suggestive of tricuspid regurgitation. IAS/Shunts: No atrial level shunt detected by color flow Doppler. Additional Comments: A device lead is visualized in the right atrium and right ventricle.  LEFT VENTRICLE PLAX 2D LVIDd:         4.30 cm   Diastology LVIDs:         2.80 cm   LV e' medial:    7.29 cm/s LV PW:         1.00 cm   LV E/e' medial:  16.0 LV IVS:        0.90 cm   LV e' lateral:   10.00 cm/s LVOT diam:     2.00 cm   LV E/e' lateral: 11.7 LV SV:         69 LV SV Index:   43 LVOT Area:     3.14 cm  RIGHT VENTRICLE RV S prime:     9.57 cm/s TAPSE (M-mode): 1.2 cm LEFT ATRIUM             Index        RIGHT ATRIUM           Index LA diam:        3.20 cm 2.02 cm/m   RA Area:     28.00 cm LA Vol (A2C):    59.2 ml 37.31 ml/m  RA Volume:   93.10 ml  58.67 ml/m LA Vol (A4C):   69.4 ml 43.74 ml/m LA Biplane Vol: 66.6 ml 41.97 ml/m  AORTIC VALVE AV Area (Vmax):    1.88 cm  AV Area (Vmean):   1.93 cm AV Area (VTI):     1.87 cm AV Vmax:           167.00 cm/s AV Vmean:          113.000 cm/s AV VTI:            0.367 m AV Peak Grad:      11.2 mmHg AV Mean Grad:      6.0 mmHg LVOT Vmax:         100.20 cm/s LVOT Vmean:        69.300 cm/s LVOT VTI:          0.219 m LVOT/AV VTI ratio: 0.60  AORTA Ao Root diam: 2.90 cm Ao Asc diam:  2.90 cm MITRAL VALVE                  TRICUSPID VALVE MV Area (PHT): 3.27 cm       TR Peak grad:   28.9 mmHg MV Area VTI:   1.80 cm       TR Vmax:        269.00 cm/s MV Peak grad:  9.4 mmHg MV Mean grad:  2.0 mmHg       SHUNTS MV Vmax:       1.53 m/s       Systemic VTI:  0.22 m MV Vmean:      65.3 cm/s      Systemic Diam: 2.00 cm MV Decel Time: 232 msec MR Peak grad:    125.0 mmHg MR Vmax:         559.00 cm/s MR PISA:         3.08 cm MR PISA Eff ROA: 22 mm MR PISA Radius:  0.70 cm MV E velocity: 117.00 cm/s MV A velocity: 58.30 cm/s MV E/A ratio:  2.01 Aditya Sabharwal Electronically signed by Alwin Baars Signature Date/Time: 12/20/2023/10:08:36 AM    Final    CT EXTREM LOWER W CM BIL Result Date: 12/18/2023 CLINICAL DATA:  Anemia after a fall. EXAM: CT OF THE LOWER BILATERAL EXTREMITY WITH CONTRAST TECHNIQUE: Multidetector CT imaging of the lower bilateral extremity was performed according to the standard protocol following intravenous contrast administration. RADIATION DOSE REDUCTION: This exam was performed according to the departmental dose-optimization program which includes automated exposure control, adjustment of the mA and/or kV according to patient size and/or use of iterative reconstruction technique. COMPARISON:  None Available. CONTRAST:  75mL OMNIPAQUE  IOHEXOL  350 MG/ML SOLN FINDINGS: Bones/Joint/Cartilage No evidence for an acute fracture in the visualized portion of the  lower sacrum. Visualized iliac bones are osteopenic but appear intact. No evidence for superior or inferior pubic ramus fracture. Patient is status post right total hip replacement without evidence for periprosthetic fracture. Status post pin placement left femoral neck without complicating features. No evidence for an acute femur fracture. No evidence for joint effusion at the knee. Small Baker cyst noted right knee. Ligaments Suboptimally assessed by CT. Muscles and Tendons Muscular anatomy of the right hip and thigh is unremarkable. 5.1 x 3.7 x 5.6 cm "mass" is identified in the inferior left gluteal musculature (axial image 81/4). This is probably an intramuscular hematoma given the patient's history although given these of IV contrast on today's study, enhancing gluteal soft tissue mass is not entirely excluded. Soft tissues Subcutaneous edema/hemorrhage is identified in the inferior left gluteal region deep to the skin. Edema/hemorrhage is seen in the subcutaneous soft tissues of the posterolateral left hip region. There is also  some subtle skin thickening in subcutaneous edema tracking along the posteromedial left distal thigh. IMPRESSION: 1. 5.1 x 3.7 x 5.6 cm "mass" in the inferior left gluteal musculature. This is probably an intramuscular hematoma given the patient's history although given the use of IV contrast on today's study, enhancing gluteal soft tissue mass is not entirely excluded. This is probably amenable to clinical inspection and follow-up recommended to ensure complete resolution. 2. Subcutaneous edema/hemorrhage in the inferior left gluteal region deep to the skin. Edema/hemorrhage is seen in the subcutaneous soft tissues of the posterolateral left hip region. There is also some subtle skin thickening and subcutaneous edema tracking along the posteromedial left distal thigh. 3. Status post right total hip replacement without evidence for periprosthetic fracture. 4. Status post pin placement  left femoral neck without complicating features. Electronically Signed   By: Donnal Fusi M.D.   On: 12/18/2023 12:44   CT ABDOMEN PELVIS W CONTRAST Result Date: 12/18/2023 CLINICAL DATA:  Abdominal trauma from a fall. EXAM: CT ABDOMEN AND PELVIS WITH CONTRAST TECHNIQUE: Multidetector CT imaging of the abdomen and pelvis was performed using the standard protocol following bolus administration of intravenous contrast. RADIATION DOSE REDUCTION: This exam was performed according to the departmental dose-optimization program which includes automated exposure control, adjustment of the mA and/or kV according to patient size and/or use of iterative reconstruction technique. CONTRAST:  75mL OMNIPAQUE  IOHEXOL  350 MG/ML SOLN COMPARISON:  04/15/2006 FINDINGS: Lower chest: Markedly enlarged heart with an interval increase in size. Cylindrical and cystic bronchiectasis with associated parenchymal densities in the right middle lobe and lingula. Hepatobiliary: Cholecystectomy clips. Small right lobe liver cyst not needing imaging follow-up. Progressive intrahepatic and extrahepatic biliary ductal dilatation, with a common duct diameter of 18.9 mm, previously 10.2 mm. No obstructing stone or mass seen. Pancreas: Moderate to marked pancreatic atrophy. Spleen: Normal in size without focal abnormality. Adrenals/Urinary Tract: Normal-appearing adrenal glands. Several tiny right renal cysts not needing imaging follow-up. Normal-appearing left kidney and visualized portions of the ureters in the urinary bladder. Portions of the urinary bladder and distal ureters are obscured by streak artifacts from a right hip prosthesis and left hip fixation hardware. Stomach/Bowel: Unremarkable stomach, small bowel and colon. Surgically absent appendix. Vascular/Lymphatic: Atheromatous arterial calcifications without aneurysm. No enlarged lymph nodes. Reproductive: Status post hysterectomy. No adnexal masses. Other: No abdominal wall hernia or  abnormality. No abdominopelvic ascites. Musculoskeletal: Right hip prosthesis and left hip fixation hardware. Mild lumbar and lower thoracic spine degenerative changes. Interval approximately 30% L1 superior endplate compression deformity and approximately 10% T11 inferior endplate compression deformity. No acute fracture lines or bony retropulsion seen. IMPRESSION: 1. No acute abnormality. 2. Interval approximately 30% L1 superior endplate compression deformity and approximately 10% T11 inferior endplate compression deformity. These are likely chronic. 3. Progressive intrahepatic and extrahepatic biliary ductal dilatation, with a common duct diameter of 18.9 mm, previously 10.2 mm. This is likely due to the patient's age and cholecystectomy. 4. Marked cardiomegaly with an interval increase in size. 5. Cylindrical and cystic bronchiectasis with associated parenchymal densities in the right middle lobe and lingula. This is likely due to chronic atypical infection such as Mycobacterium avium intracellulare. Electronically Signed   By: Catherin Closs M.D.   On: 12/18/2023 12:21   CT HEAD WO CONTRAST ( ) Result Date: 12/18/2023 CLINICAL DATA:  Fall with minor head trauma. EXAM: CT HEAD WITHOUT CONTRAST TECHNIQUE: Contiguous axial images were obtained from the base of the skull through the vertex without intravenous contrast. RADIATION DOSE  REDUCTION: This exam was performed according to the departmental dose-optimization program which includes automated exposure control, adjustment of the mA and/or kV according to patient size and/or use of iterative reconstruction technique. COMPARISON:  Head CT 11/07/2022 FINDINGS: Brain: There is moderate cerebral atrophy, mild cerebellar atrophy, moderate to severe small-vessel change of the cerebral white matter and mild to moderate atrophic ventriculomegaly. No cortical based acute infarct, hemorrhage, mass, mass effect or midline shift are seen. The basal cisterns are clear.  Vascular: Both siphons and the distal left vertebral artery are heavily calcified. No hyperdense central vessel is seen. Skull: Negative for fractures or focal lesions. No visible scalp hematoma. Sinuses/Orbits: There is moderate circumferential membrane thickening again in the left maxillary sinus but there is new fluid in the sinus which could indicate acute on chronic sinusitis. Rest of the sinuses, bilateral mastoid air cells, and middle ears are clear. Nasal septum is midline. Negative orbits apart from old lens replacements. Other: None. IMPRESSION: 1. No acute intracranial CT findings or depressed skull fractures. 2. Atrophy and small-vessel disease.  Stable exam. 3. Left maxillary sinusitis. Electronically Signed   By: Denman Fischer M.D.   On: 12/18/2023 07:51   DG Shoulder Left Result Date: 12/18/2023 CLINICAL DATA:  Fall. EXAM: LEFT SHOULDER - 3 VIEW COMPARISON:  None Available. FINDINGS: Osseous structures are osteopenic. There is no evidence of fracture or dislocation. Acromioclavicular degenerative change with narrowing and osteophyte formation. IMPRESSION: Acromioclavicular degenerative changes. No acute osseous abnormalities. Electronically Signed   By: Sydell Eva M.D.   On: 12/18/2023 07:30   DG Hip Unilat W or Wo Pelvis 2-3 Views Left Result Date: 12/14/2023 CLINICAL DATA:  Fall with pain to the left hip EXAM: DG HIP (WITH OR WITHOUT PELVIS) 2-3V LEFT COMPARISON:  03/13/2022 FINDINGS: SI joints are non widened. Pubic symphysis and rami appear intact. Extensive vascular calcifications. Right hip replacement with normal alignment. Screw fixation of proximal left femur also stable. No definite acute displaced fracture or malalignment. Mild left hip degenerative change IMPRESSION: 1. No definite acute osseous abnormality. 2. Right hip replacement. Screw fixation of proximal left femur. Electronically Signed   By: Esmeralda Hedge M.D.   On: 12/14/2023 21:15       The results of  significant diagnostics from this hospitalization (including imaging, microbiology, ancillary and laboratory) are listed below for reference.     Microbiology: No results found for this or any previous visit (from the past 240 hours).   Labs:  CBC: Recent Labs  Lab 12/18/23 0813 12/19/23 0845 12/20/23 0702  WBC 5.0 6.4 6.1  HGB 8.5* 9.4* 8.3*  HCT 26.0* 29.0* 25.4*  MCV 94.9 95.7 97.3  PLT 219 290 209   BMP &GFR Recent Labs  Lab 12/18/23 0813 12/19/23 0845 12/20/23 0702  NA 132* 131* 131*  K 4.3 3.8 4.5  CL 99 98 102  CO2 25 23 22   GLUCOSE 93 151* 87  BUN 16 14 15   CREATININE 0.93 0.89 0.73  CALCIUM 8.3* 8.3* 7.7*  MG  --  1.7 2.0  PHOS  --  4.2 3.5   CrCl cannot be calculated (Unknown ideal weight.). Liver & Pancreas: Recent Labs  Lab 12/18/23 0813 12/19/23 0845 12/20/23 0702  AST 21  --   --   ALT 14  --   --   ALKPHOS 111  --   --   BILITOT 1.0  --   --   PROT 5.4*  --   --   ALBUMIN 2.5*  2.6* 2.3*   No results for input(s): "LIPASE", "AMYLASE" in the last 168 hours. No results for input(s): "AMMONIA" in the last 168 hours. Diabetic: No results for input(s): "HGBA1C" in the last 72 hours. No results for input(s): "GLUCAP" in the last 168 hours. Cardiac Enzymes: Recent Labs  Lab 12/19/23 0845  CKTOTAL 59   No results for input(s): "PROBNP" in the last 8760 hours. Coagulation Profile: No results for input(s): "INR", "PROTIME" in the last 168 hours. Thyroid  Function Tests: Recent Labs    12/19/23 0843  TSH 4.456   Lipid Profile: No results for input(s): "CHOL", "HDL", "LDLCALC", "TRIG", "CHOLHDL", "LDLDIRECT" in the last 72 hours. Anemia Panel: Recent Labs    12/19/23 0843 12/19/23 1613  VITAMINB12  --  168*  FOLATE  --  8.5  FERRITIN  --  90  TIBC  --  249*  IRON  --  46  RETICCTPCT 2.6  --    Urine analysis:    Component Value Date/Time   COLORURINE YELLOW 06/08/2022 1127   APPEARANCEUR CLEAR 06/08/2022 1127   LABSPEC 1.010  06/08/2022 1127   PHURINE 6.0 06/08/2022 1127   GLUCOSEU NEGATIVE 06/08/2022 1127   HGBUR NEGATIVE 06/08/2022 1127   BILIRUBINUR NEGATIVE 06/08/2022 1127   KETONESUR NEGATIVE 06/08/2022 1127   PROTEINUR NEGATIVE 06/08/2022 1127   NITRITE NEGATIVE 06/08/2022 1127   LEUKOCYTESUR NEGATIVE 06/08/2022 1127   Sepsis Labs: Invalid input(s): "PROCALCITONIN", "LACTICIDVEN"   SIGNED:  Theadore Finger, MD  [facility] 12/20/2023, 1:31 PM

## 2023-12-20 NOTE — TOC CM/SW Note (Signed)
    Durable Medical Equipment  (From admission, onward)           Start     Ordered   12/20/23 1320  For home use only DME Walker rolling  Once       Question Answer Comment  Walker: With 5 Inch Wheels   Patient needs a walker to treat with the following condition Weakness      12/20/23 1319   12/20/23 1320  For home use only DME 3 n 1  Once        12/20/23 1319           Patient confined to a room with no bathroom therefore needs a 3 in 1 , bedside commode.

## 2023-12-20 NOTE — Progress Notes (Signed)
 Frances Ingles, niece & legal guardian, notified on pt discharge.  Per legal guardian, Socorro Dunks (friend), will pick up pt and transport to facility.

## 2023-12-20 NOTE — Progress Notes (Signed)
 Echocardiogram 2D Echocardiogram has been performed.  Emmaline Haring Ansley Stanwood RDCS 12/20/2023, 9:32 AM

## 2023-12-20 NOTE — TOC Transition Note (Signed)
 Transition of Care [facility]) - Discharge Note   Patient Details  Name: Megan Olsen MRN: [id number] Date of Birth: [birth year]  [facility] CM/SW Contact:  Tandy Fam, LCSW Phone Number: 12/20/2023, 1:55 PM   Clinical Narrative:   CSW spoke with patient's niece, Cain Castillo, to discuss return to [facility] and DME recommended. Cain Castillo in agreement with recommendations. CSW spoke with Valinda Gault at [facility], she is in agreement with [facility] and DME, as well. Sherry requesting that DME be delivered to patient's apartment. Patient already active with [facility], new orders placed for PT and OT. RNCM ordered RW and 3N1 through [employer] to be delivered to facility. CSW spoke with Cain Castillo about transportation, patient's friend, Haskell Linker, will be able to provide transport when patient is ready. Information provided to RN. Discharge information sent to [facility], CSW confirmed receipt. No further TOC needs at this time.     Final next level of care: Assisted Living Barriers to Discharge: Barriers Resolved   Patient Goals and CMS Choice Patient states their goals for this hospitalization and ongoing recovery are:: to get back to [facility].gov Compare Post Acute Care list provided to:: Patient Choice offered to / list presented to : Patient, [facility] POA / Guardian      Discharge Placement                Patient to be transferred to facility by: Friend Name of family member notified: Cain Castillo Patient and family notified of of transfer: 12/20/23  Discharge Plan and Services Additional resources added to the After Visit Summary for                  DME Arranged: 3-N-1, Walker rolling DME Agency: [facility] Date DME Agency Contacted: 12/20/23   Representative spoke with at DME Agency: [facility] Arranged: RN, PT, OT [facility] Agency: [facility] Date [facility] Agency Contacted: 12/20/23   Representative spoke with at [facility] Agency: [facility]  Social  Drivers of Health (SDOH) Interventions SDOH Screenings   Food Insecurity: No Food Insecurity (12/18/2023)  Housing: Low Risk  (12/18/2023)  Transportation Needs: No Transportation Needs (12/18/2023)  Utilities: Not At Risk (12/18/2023)  Alcohol Screen: Low Risk  (09/26/2020)  Depression (PHQ2-9): Medium Risk (02/16/2022)  Financial Resource Strain: Low Risk  (09/02/2021)  Physical Activity: Inactive (09/02/2021)  Social Connections: Moderately Isolated (12/19/2023)  Stress: No Stress Concern Present (09/02/2021)  Tobacco Use: Low Risk  (12/18/2023)     Readmission Risk Interventions     No data to display

## 2023-12-20 NOTE — Progress Notes (Signed)
 IV removed due to d/c orders from MD. Tolerated well, Patient will be assisted to get dressed for d/c. Bed in lowest position, call light within reach.

## 2023-12-20 NOTE — NC FL2 (Signed)
 [place]  MEDICAID FL2 LEVEL OF CARE FORM     IDENTIFICATION  Patient Name: Megan Olsen Birthdate: 1927-08-21 Sex: female Admission Date (Current Location): 12/18/2023  [facility] and [place] Number:  Producer, television/film/video and Address:  [facility], [address], [place], [place] [address]      Provider Number: [id number]  Attending Physician Name and Address:  Theadore Finger, MD  Relative Name and Phone Number:       Current Level of Care: Hospital Recommended Level of Care: Assisted Living Facility Prior Approval Number:    Date Approved/Denied:   PASRR Number:    Discharge Plan: Other (Comment) (ALF)    Current Diagnoses: Patient Active Problem List   Diagnosis Date Noted   Severe tricuspid valve regurgitation 12/20/2023   Severe mitral valve regurgitation 12/20/2023   Orthostatic hypotension 12/20/2023   Chronic heart failure with preserved ejection fraction (HFpEF) (HCC) 12/20/2023   Moderate aortic valve stenosis 12/20/2023   Symptomatic anemia 12/18/2023   Fall at home, initial encounter 03/14/2022   Elevated CPK 03/14/2022   Hypomagnesemia 03/14/2022   Generalized weakness 03/13/2022   Encounter for testing for latent tuberculosis 02/16/2022   History of myocardial infarction 01/26/2022   NSTEMI (non-ST elevated myocardial infarction) (HCC) 01/30/2019   B12 deficiency 08/22/2018   Facet arthritis, degenerative, cervical spine 08/22/2018   Tachy-brady syndrome (HCC) 04/27/2018   Cervical spondylosis without myelopathy 03/11/2018   Myofascial pain syndrome 03/11/2018   Cervicalgia 03/11/2018   Insomnia 01/26/2018   High risk medication use 01/26/2018   Macular degeneration of both eyes 01/26/2018   S/P right THA, AA 02/23/2017   A-fib (HCC), s/p catheter ablation of SVT in 2002 07/06/2014   History of fracture of left hip 07/06/2014   Benign essential HTN 07/06/2014   GERD (gastroesophageal reflux disease) 07/06/2014   Anxiety  disorder 07/06/2014   History of CVA (cerebrovascular accident), without residual effects 07/06/2014   Chronic iron deficiency anemia 01/02/2013   Constipation, intermittent 08/04/2007   Depression 01/11/2007   Allergic rhinitis 01/11/2007   Osteoarthritis 01/11/2007   Osteoporosis 01/11/2007    Orientation RESPIRATION BLADDER Height & Weight     Self, Time, Place, Situation  Normal Continent Weight:   Height:  5\' 5"  (165.1 cm)  BEHAVIORAL SYMPTOMS/MOOD NEUROLOGICAL BOWEL NUTRITION STATUS      Continent Diet (regular)  AMBULATORY STATUS COMMUNICATION OF NEEDS Skin   Supervision Verbally Other (Comment) (laceration/skin tear: right pretibial, impregnated gauze; right arm, impregnated gauze)                       Personal Care Assistance Level of Assistance  Bathing, Feeding, Dressing Bathing Assistance: Limited assistance Feeding assistance: Limited assistance Dressing Assistance: Limited assistance     Functional Limitations Info  Sight Sight Info: Impaired        SPECIAL CARE FACTORS FREQUENCY  PT (By licensed PT), OT (By licensed OT)     PT Frequency: 3x/wk with home health OT Frequency: 3x/wk with home health            Contractures Contractures Info: Not present    Additional Factors Info  Code Status, Allergies Code Status Info: DNR Allergies Info: Darvon (Propoxyphene), Propoxyphene N-acetaminophen , Allegra (Fexofenadine), Celebrex (Celecoxib), Penicillins              Discharge Medications: PAUSE taking these medications     Eliquis  2.5 MG Tabs tablet Wait to take this until: December 23, 2023 Generic  drug: apixaban  TAKE 1 TABLET TWICE DAILY           STOP taking these medications     benzonatate  100 MG capsule Commonly known as: TESSALON     bisoprolol -hydrochlorothiazide  2.5-6.25 MG tablet Commonly known as: ZIAC     LORazepam  1 MG tablet Commonly known as: ATIVAN     losartan  25 MG tablet Commonly known as: COZAAR     traZODone   100 MG tablet Commonly known as: DESYREL            TAKE these medications     acetaminophen  325 MG tablet Commonly known as: TYLENOL  Take 650 mg by mouth every 8 (eight) hours as needed for moderate pain (pain score 4-6) or mild pain (pain score 1-3).    cyanocobalamin  1000 MCG tablet Take 1 tablet (1,000 mcg total) by mouth daily. Start taking on: December 21, 2023    dexlansoprazole  60 MG capsule Commonly known as: DEXILANT  TAKE 1 CAPSULE EVERY DAY    fluticasone  50 MCG/ACT nasal spray Commonly known as: FLONASE  PLACE 2 SPRAYS INTO BOTH NOSTRILS DAILY.    furosemide  20 MG tablet Commonly known as: Lasix  Take 1 tablet (20 mg total) by mouth daily as needed for fluid or edema.    GERI-TUSSIN PO Take 10 mLs by mouth every 6 (six) hours as needed (cough).    LACTOBACILLUS PO Take 1 capsule by mouth 3 (three) times daily. For 15 days, 5/19-12/20/23.    loperamide 2 MG tablet Commonly known as: IMODIUM A-D Take 2 mg by mouth every 8 (eight) hours as needed for diarrhea or loose stools.    memantine 5 MG tablet Commonly known as: NAMENDA Take 5 mg by mouth 2 (two) times daily.    metoprolol  tartrate 25 MG tablet Commonly known as: LOPRESSOR  Take 0.5 tablets (12.5 mg total) by mouth 2 (two) times daily.    nitroGLYCERIN  0.4 MG SL tablet Commonly known as: NITROSTAT  Place 1 tablet (0.4 mg total) under the tongue every 5 (five) minutes x 3 doses as needed for chest pain.    PRESERVISION AREDS 2 PO Take 1 capsule by mouth 2 (two) times daily.    Simethicone  125 MG Caps Take 125 mg by mouth every 6 (six) hours as needed (indigestion).    Relevant Imaging Results:  Relevant Lab Results:   Additional Information SS#: [id number]  Tandy Fam, LCSW

## 2023-12-21 DIAGNOSIS — N182 Chronic kidney disease, stage 2 (mild): Secondary | ICD-10-CM | POA: Diagnosis not present

## 2023-12-21 DIAGNOSIS — I495 Sick sinus syndrome: Secondary | ICD-10-CM | POA: Diagnosis not present

## 2023-12-21 DIAGNOSIS — I959 Hypotension, unspecified: Secondary | ICD-10-CM | POA: Diagnosis not present

## 2023-12-21 DIAGNOSIS — F330 Major depressive disorder, recurrent, mild: Secondary | ICD-10-CM | POA: Diagnosis not present

## 2023-12-21 DIAGNOSIS — I129 Hypertensive chronic kidney disease with stage 1 through stage 4 chronic kidney disease, or unspecified chronic kidney disease: Secondary | ICD-10-CM | POA: Diagnosis not present

## 2023-12-21 DIAGNOSIS — D509 Iron deficiency anemia, unspecified: Secondary | ICD-10-CM | POA: Diagnosis not present

## 2023-12-21 DIAGNOSIS — S81801D Unspecified open wound, right lower leg, subsequent encounter: Secondary | ICD-10-CM | POA: Diagnosis not present

## 2023-12-21 DIAGNOSIS — I4891 Unspecified atrial fibrillation: Secondary | ICD-10-CM | POA: Diagnosis not present

## 2023-12-21 DIAGNOSIS — F0390 Unspecified dementia without behavioral disturbance: Secondary | ICD-10-CM | POA: Diagnosis not present

## 2023-12-21 DIAGNOSIS — I4892 Unspecified atrial flutter: Secondary | ICD-10-CM | POA: Diagnosis not present

## 2023-12-21 DIAGNOSIS — L03115 Cellulitis of right lower limb: Secondary | ICD-10-CM | POA: Diagnosis not present

## 2023-12-22 DIAGNOSIS — R0781 Pleurodynia: Secondary | ICD-10-CM | POA: Diagnosis not present

## 2023-12-27 DIAGNOSIS — I129 Hypertensive chronic kidney disease with stage 1 through stage 4 chronic kidney disease, or unspecified chronic kidney disease: Secondary | ICD-10-CM | POA: Diagnosis not present

## 2023-12-27 DIAGNOSIS — F330 Major depressive disorder, recurrent, mild: Secondary | ICD-10-CM | POA: Diagnosis not present

## 2023-12-27 DIAGNOSIS — D509 Iron deficiency anemia, unspecified: Secondary | ICD-10-CM | POA: Diagnosis not present

## 2023-12-27 DIAGNOSIS — W1830XD Fall on same level, unspecified, subsequent encounter: Secondary | ICD-10-CM | POA: Diagnosis not present

## 2023-12-27 DIAGNOSIS — W010XXD Fall on same level from slipping, tripping and stumbling without subsequent striking against object, subsequent encounter: Secondary | ICD-10-CM | POA: Diagnosis not present

## 2023-12-27 DIAGNOSIS — S7012XD Contusion of left thigh, subsequent encounter: Secondary | ICD-10-CM | POA: Diagnosis not present

## 2023-12-27 DIAGNOSIS — I4891 Unspecified atrial fibrillation: Secondary | ICD-10-CM | POA: Diagnosis not present

## 2023-12-27 DIAGNOSIS — F419 Anxiety disorder, unspecified: Secondary | ICD-10-CM | POA: Diagnosis not present

## 2023-12-27 DIAGNOSIS — D519 Vitamin B12 deficiency anemia, unspecified: Secondary | ICD-10-CM | POA: Diagnosis not present

## 2023-12-27 DIAGNOSIS — I350 Nonrheumatic aortic (valve) stenosis: Secondary | ICD-10-CM | POA: Diagnosis not present

## 2023-12-27 DIAGNOSIS — I959 Hypotension, unspecified: Secondary | ICD-10-CM | POA: Diagnosis not present

## 2023-12-27 DIAGNOSIS — N182 Chronic kidney disease, stage 2 (mild): Secondary | ICD-10-CM | POA: Diagnosis not present

## 2023-12-27 DIAGNOSIS — L03115 Cellulitis of right lower limb: Secondary | ICD-10-CM | POA: Diagnosis not present

## 2023-12-27 DIAGNOSIS — I4892 Unspecified atrial flutter: Secondary | ICD-10-CM | POA: Diagnosis not present

## 2023-12-27 DIAGNOSIS — Z7901 Long term (current) use of anticoagulants: Secondary | ICD-10-CM | POA: Diagnosis not present

## 2023-12-27 DIAGNOSIS — E538 Deficiency of other specified B group vitamins: Secondary | ICD-10-CM | POA: Diagnosis not present

## 2023-12-27 DIAGNOSIS — I951 Orthostatic hypotension: Secondary | ICD-10-CM | POA: Diagnosis not present

## 2023-12-27 DIAGNOSIS — I495 Sick sinus syndrome: Secondary | ICD-10-CM | POA: Diagnosis not present

## 2023-12-27 DIAGNOSIS — I509 Heart failure, unspecified: Secondary | ICD-10-CM | POA: Diagnosis not present

## 2023-12-27 DIAGNOSIS — S81801D Unspecified open wound, right lower leg, subsequent encounter: Secondary | ICD-10-CM | POA: Diagnosis not present

## 2023-12-28 DIAGNOSIS — S7012XD Contusion of left thigh, subsequent encounter: Secondary | ICD-10-CM | POA: Diagnosis not present

## 2023-12-28 DIAGNOSIS — Z7901 Long term (current) use of anticoagulants: Secondary | ICD-10-CM | POA: Diagnosis not present

## 2023-12-31 DIAGNOSIS — L03115 Cellulitis of right lower limb: Secondary | ICD-10-CM | POA: Diagnosis not present

## 2023-12-31 DIAGNOSIS — I959 Hypotension, unspecified: Secondary | ICD-10-CM | POA: Diagnosis not present

## 2023-12-31 DIAGNOSIS — I4891 Unspecified atrial fibrillation: Secondary | ICD-10-CM | POA: Diagnosis not present

## 2023-12-31 DIAGNOSIS — N182 Chronic kidney disease, stage 2 (mild): Secondary | ICD-10-CM | POA: Diagnosis not present

## 2023-12-31 DIAGNOSIS — S81801D Unspecified open wound, right lower leg, subsequent encounter: Secondary | ICD-10-CM | POA: Diagnosis not present

## 2023-12-31 DIAGNOSIS — I495 Sick sinus syndrome: Secondary | ICD-10-CM | POA: Diagnosis not present

## 2023-12-31 DIAGNOSIS — I4892 Unspecified atrial flutter: Secondary | ICD-10-CM | POA: Diagnosis not present

## 2023-12-31 DIAGNOSIS — I129 Hypertensive chronic kidney disease with stage 1 through stage 4 chronic kidney disease, or unspecified chronic kidney disease: Secondary | ICD-10-CM | POA: Diagnosis not present

## 2023-12-31 DIAGNOSIS — D509 Iron deficiency anemia, unspecified: Secondary | ICD-10-CM | POA: Diagnosis not present

## 2024-01-03 DIAGNOSIS — S81801D Unspecified open wound, right lower leg, subsequent encounter: Secondary | ICD-10-CM | POA: Diagnosis not present

## 2024-01-03 DIAGNOSIS — I4891 Unspecified atrial fibrillation: Secondary | ICD-10-CM | POA: Diagnosis not present

## 2024-01-03 DIAGNOSIS — L03115 Cellulitis of right lower limb: Secondary | ICD-10-CM | POA: Diagnosis not present

## 2024-01-03 DIAGNOSIS — D509 Iron deficiency anemia, unspecified: Secondary | ICD-10-CM | POA: Diagnosis not present

## 2024-01-03 DIAGNOSIS — I959 Hypotension, unspecified: Secondary | ICD-10-CM | POA: Diagnosis not present

## 2024-01-03 DIAGNOSIS — I4892 Unspecified atrial flutter: Secondary | ICD-10-CM | POA: Diagnosis not present

## 2024-01-03 DIAGNOSIS — I129 Hypertensive chronic kidney disease with stage 1 through stage 4 chronic kidney disease, or unspecified chronic kidney disease: Secondary | ICD-10-CM | POA: Diagnosis not present

## 2024-01-03 DIAGNOSIS — N182 Chronic kidney disease, stage 2 (mild): Secondary | ICD-10-CM | POA: Diagnosis not present

## 2024-01-03 DIAGNOSIS — I495 Sick sinus syndrome: Secondary | ICD-10-CM | POA: Diagnosis not present

## 2024-01-04 DIAGNOSIS — D509 Iron deficiency anemia, unspecified: Secondary | ICD-10-CM | POA: Diagnosis not present

## 2024-01-04 DIAGNOSIS — N182 Chronic kidney disease, stage 2 (mild): Secondary | ICD-10-CM | POA: Diagnosis not present

## 2024-01-04 DIAGNOSIS — I4891 Unspecified atrial fibrillation: Secondary | ICD-10-CM | POA: Diagnosis not present

## 2024-01-04 DIAGNOSIS — I959 Hypotension, unspecified: Secondary | ICD-10-CM | POA: Diagnosis not present

## 2024-01-04 DIAGNOSIS — I129 Hypertensive chronic kidney disease with stage 1 through stage 4 chronic kidney disease, or unspecified chronic kidney disease: Secondary | ICD-10-CM | POA: Diagnosis not present

## 2024-01-04 DIAGNOSIS — S81801D Unspecified open wound, right lower leg, subsequent encounter: Secondary | ICD-10-CM | POA: Diagnosis not present

## 2024-01-04 DIAGNOSIS — I4892 Unspecified atrial flutter: Secondary | ICD-10-CM | POA: Diagnosis not present

## 2024-01-04 DIAGNOSIS — L03115 Cellulitis of right lower limb: Secondary | ICD-10-CM | POA: Diagnosis not present

## 2024-01-04 DIAGNOSIS — I495 Sick sinus syndrome: Secondary | ICD-10-CM | POA: Diagnosis not present

## 2024-01-06 DIAGNOSIS — I951 Orthostatic hypotension: Secondary | ICD-10-CM | POA: Diagnosis not present

## 2024-01-06 DIAGNOSIS — I5032 Chronic diastolic (congestive) heart failure: Secondary | ICD-10-CM | POA: Diagnosis not present

## 2024-01-06 DIAGNOSIS — I10 Essential (primary) hypertension: Secondary | ICD-10-CM | POA: Diagnosis not present

## 2024-01-06 DIAGNOSIS — Z515 Encounter for palliative care: Secondary | ICD-10-CM | POA: Diagnosis not present

## 2024-01-06 DIAGNOSIS — S300XXD Contusion of lower back and pelvis, subsequent encounter: Secondary | ICD-10-CM | POA: Diagnosis not present

## 2024-01-06 DIAGNOSIS — K219 Gastro-esophageal reflux disease without esophagitis: Secondary | ICD-10-CM | POA: Diagnosis not present

## 2024-01-06 DIAGNOSIS — I4891 Unspecified atrial fibrillation: Secondary | ICD-10-CM | POA: Diagnosis not present

## 2024-01-06 DIAGNOSIS — Z7901 Long term (current) use of anticoagulants: Secondary | ICD-10-CM | POA: Diagnosis not present

## 2024-01-06 DIAGNOSIS — F0390 Unspecified dementia without behavioral disturbance: Secondary | ICD-10-CM | POA: Diagnosis not present

## 2024-01-08 DIAGNOSIS — I4892 Unspecified atrial flutter: Secondary | ICD-10-CM | POA: Diagnosis not present

## 2024-01-08 DIAGNOSIS — D509 Iron deficiency anemia, unspecified: Secondary | ICD-10-CM | POA: Diagnosis not present

## 2024-01-08 DIAGNOSIS — I129 Hypertensive chronic kidney disease with stage 1 through stage 4 chronic kidney disease, or unspecified chronic kidney disease: Secondary | ICD-10-CM | POA: Diagnosis not present

## 2024-01-08 DIAGNOSIS — I959 Hypotension, unspecified: Secondary | ICD-10-CM | POA: Diagnosis not present

## 2024-01-08 DIAGNOSIS — S81801D Unspecified open wound, right lower leg, subsequent encounter: Secondary | ICD-10-CM | POA: Diagnosis not present

## 2024-01-08 DIAGNOSIS — N182 Chronic kidney disease, stage 2 (mild): Secondary | ICD-10-CM | POA: Diagnosis not present

## 2024-01-08 DIAGNOSIS — I4891 Unspecified atrial fibrillation: Secondary | ICD-10-CM | POA: Diagnosis not present

## 2024-01-08 DIAGNOSIS — L03115 Cellulitis of right lower limb: Secondary | ICD-10-CM | POA: Diagnosis not present

## 2024-01-08 DIAGNOSIS — I495 Sick sinus syndrome: Secondary | ICD-10-CM | POA: Diagnosis not present

## 2024-01-09 DIAGNOSIS — I10 Essential (primary) hypertension: Secondary | ICD-10-CM | POA: Diagnosis not present

## 2024-01-09 DIAGNOSIS — I5032 Chronic diastolic (congestive) heart failure: Secondary | ICD-10-CM | POA: Diagnosis not present

## 2024-01-10 DIAGNOSIS — I5032 Chronic diastolic (congestive) heart failure: Secondary | ICD-10-CM | POA: Diagnosis not present

## 2024-01-10 DIAGNOSIS — F330 Major depressive disorder, recurrent, mild: Secondary | ICD-10-CM | POA: Diagnosis not present

## 2024-01-10 DIAGNOSIS — F0390 Unspecified dementia without behavioral disturbance: Secondary | ICD-10-CM | POA: Diagnosis not present

## 2024-01-10 DIAGNOSIS — I959 Hypotension, unspecified: Secondary | ICD-10-CM | POA: Diagnosis not present

## 2024-01-10 DIAGNOSIS — F419 Anxiety disorder, unspecified: Secondary | ICD-10-CM | POA: Diagnosis not present

## 2024-01-12 DIAGNOSIS — I4891 Unspecified atrial fibrillation: Secondary | ICD-10-CM | POA: Diagnosis not present

## 2024-01-12 DIAGNOSIS — I4892 Unspecified atrial flutter: Secondary | ICD-10-CM | POA: Diagnosis not present

## 2024-01-12 DIAGNOSIS — I959 Hypotension, unspecified: Secondary | ICD-10-CM | POA: Diagnosis not present

## 2024-01-12 DIAGNOSIS — S81801D Unspecified open wound, right lower leg, subsequent encounter: Secondary | ICD-10-CM | POA: Diagnosis not present

## 2024-01-12 DIAGNOSIS — L03115 Cellulitis of right lower limb: Secondary | ICD-10-CM | POA: Diagnosis not present

## 2024-01-12 DIAGNOSIS — I129 Hypertensive chronic kidney disease with stage 1 through stage 4 chronic kidney disease, or unspecified chronic kidney disease: Secondary | ICD-10-CM | POA: Diagnosis not present

## 2024-01-12 DIAGNOSIS — I495 Sick sinus syndrome: Secondary | ICD-10-CM | POA: Diagnosis not present

## 2024-01-12 DIAGNOSIS — N182 Chronic kidney disease, stage 2 (mild): Secondary | ICD-10-CM | POA: Diagnosis not present

## 2024-01-12 DIAGNOSIS — D509 Iron deficiency anemia, unspecified: Secondary | ICD-10-CM | POA: Diagnosis not present

## 2024-01-13 DIAGNOSIS — R2689 Other abnormalities of gait and mobility: Secondary | ICD-10-CM | POA: Diagnosis not present

## 2024-01-13 DIAGNOSIS — F419 Anxiety disorder, unspecified: Secondary | ICD-10-CM | POA: Diagnosis not present

## 2024-01-13 DIAGNOSIS — R238 Other skin changes: Secondary | ICD-10-CM | POA: Diagnosis not present

## 2024-01-13 DIAGNOSIS — R262 Difficulty in walking, not elsewhere classified: Secondary | ICD-10-CM | POA: Diagnosis not present

## 2024-01-13 DIAGNOSIS — M6281 Muscle weakness (generalized): Secondary | ICD-10-CM | POA: Diagnosis not present

## 2024-01-13 DIAGNOSIS — B351 Tinea unguium: Secondary | ICD-10-CM | POA: Diagnosis not present

## 2024-01-13 DIAGNOSIS — H3530 Unspecified macular degeneration: Secondary | ICD-10-CM | POA: Diagnosis not present

## 2024-01-13 DIAGNOSIS — I483 Typical atrial flutter: Secondary | ICD-10-CM | POA: Diagnosis not present

## 2024-01-13 DIAGNOSIS — I10 Essential (primary) hypertension: Secondary | ICD-10-CM | POA: Diagnosis not present

## 2024-01-20 ENCOUNTER — Other Ambulatory Visit: Payer: Self-pay

## 2024-01-20 ENCOUNTER — Encounter (HOSPITAL_COMMUNITY): Payer: Self-pay | Admitting: *Deleted

## 2024-01-20 ENCOUNTER — Emergency Department (HOSPITAL_COMMUNITY)
Admission: EM | Admit: 2024-01-20 | Discharge: 2024-01-20 | Disposition: A | Discharge status: Skilled Nursing Facility | Attending: Emergency Medicine | Admitting: Emergency Medicine

## 2024-01-20 DIAGNOSIS — W19XXXA Unspecified fall, initial encounter: Secondary | ICD-10-CM | POA: Diagnosis not present

## 2024-01-20 DIAGNOSIS — Z7901 Long term (current) use of anticoagulants: Secondary | ICD-10-CM | POA: Insufficient documentation

## 2024-01-20 DIAGNOSIS — Z4800 Encounter for change or removal of nonsurgical wound dressing: Secondary | ICD-10-CM | POA: Diagnosis not present

## 2024-01-20 DIAGNOSIS — X58XXXA Exposure to other specified factors, initial encounter: Secondary | ICD-10-CM | POA: Diagnosis not present

## 2024-01-20 DIAGNOSIS — R079 Chest pain, unspecified: Secondary | ICD-10-CM | POA: Diagnosis not present

## 2024-01-20 DIAGNOSIS — R112 Nausea with vomiting, unspecified: Secondary | ICD-10-CM | POA: Diagnosis not present

## 2024-01-20 DIAGNOSIS — R609 Edema, unspecified: Secondary | ICD-10-CM | POA: Diagnosis not present

## 2024-01-20 DIAGNOSIS — Z5189 Encounter for other specified aftercare: Secondary | ICD-10-CM

## 2024-01-20 DIAGNOSIS — R531 Weakness: Secondary | ICD-10-CM | POA: Diagnosis not present

## 2024-01-20 DIAGNOSIS — R69 Illness, unspecified: Secondary | ICD-10-CM | POA: Diagnosis not present

## 2024-01-20 DIAGNOSIS — Z4801 Encounter for change or removal of surgical wound dressing: Secondary | ICD-10-CM | POA: Diagnosis not present

## 2024-01-20 DIAGNOSIS — S81852A Open bite, left lower leg, initial encounter: Secondary | ICD-10-CM | POA: Insufficient documentation

## 2024-01-20 DIAGNOSIS — Z7401 Bed confinement status: Secondary | ICD-10-CM | POA: Diagnosis not present

## 2024-01-20 MED ORDER — DOXYCYCLINE HYCLATE 100 MG PO CAPS: 100.0000 mg | ORAL_CAPSULE | Freq: Two times a day (BID) | ORAL | 0 refills | Status: DC

## 2024-01-20 NOTE — ED Provider Notes (Addendum)
 [place] EMERGENCY DEPARTMENT AT [facility] Provider Note   CSN: [id number] Arrival date & time: 01/20/24  1522     Patient presents with: Wound Check   Megan Olsen is a 89 y.o. female patient who presents to the emergency department today for further evaluation of anterior left shin wound.  This is a chronic wound but the patient states she is getting onto the bus and she knocked it against the side causing her to reopen and started bleeding.  Patient denies any complaints.  Patient denies fever or chills.    Wound Check       Prior to Admission medications   Medication Sig Start Date End Date Taking? Authorizing Provider  doxycycline  (VIBRAMYCIN ) 100 MG capsule Take 1 capsule (100 mg total) by mouth 2 (two) times daily. 01/20/24  Yes Theotis, Napoleon Monacelli M, PA-C  acetaminophen  (TYLENOL ) 325 MG tablet Take 650 mg by mouth every 8 (eight) hours as needed for moderate pain (pain score 4-6) or mild pain (pain score 1-3).    [provider]  apixaban  (ELIQUIS ) 2.5 MG TABS tablet TAKE 1 TABLET TWICE DAILY 01/19/22   Fernande Elspeth BROCKS, MD  cyanocobalamin  1000 MCG tablet Take 1 tablet (1,000 mcg total) by mouth daily. 12/21/23   Gonfa, Taye T, MD  dexlansoprazole  (DEXILANT ) 60 MG capsule TAKE 1 CAPSULE EVERY DAY 11/12/21   Koberlein, Junell C, MD  fluticasone  (FLONASE ) 50 MCG/ACT nasal spray PLACE 2 SPRAYS INTO BOTH NOSTRILS DAILY. 09/05/21   Koberlein, Junell C, MD  furosemide  (LASIX ) 20 MG tablet Take 1 tablet (20 mg total) by mouth daily as needed for fluid or edema. 12/20/23   Gonfa, Taye T, MD  guaiFENesin  (GERI-TUSSIN PO) Take 10 mLs by mouth every 6 (six) hours as needed (cough).    [provider]  LACTOBACILLUS PO Take 1 capsule by mouth 3 (three) times daily. For 15 days, 5/19-12/20/23.    [provider]  loperamide (IMODIUM A-D) 2 MG tablet Take 2 mg by mouth every 8 (eight) hours as needed for diarrhea or loose stools.    [provider]   memantine (NAMENDA) 5 MG tablet Take 5 mg by mouth 2 (two) times daily.    [provider]  metoprolol  tartrate (LOPRESSOR ) 25 MG tablet Take 0.5 tablets (12.5 mg total) by mouth 2 (two) times daily. 12/20/23 12/19/24  Gonfa, Taye T, MD  Multiple Vitamins-Minerals (PRESERVISION AREDS 2 PO) Take 1 capsule by mouth 2 (two) times daily.    [provider]  nitroGLYCERIN  (NITROSTAT ) 0.4 MG SL tablet Place 1 tablet (0.4 mg total) under the tongue every 5 (five) minutes x 3 doses as needed for chest pain. 02/01/19   Samtani, Jai-Gurmukh, MD  Simethicone  125 MG CAPS Take 125 mg by mouth every 6 (six) hours as needed (indigestion).    [provider]    Allergies: Darvon [propoxyphene], Propoxyphene n-acetaminophen , Allegra [fexofenadine], Celebrex [celecoxib], and Penicillins    Review of Systems  All other systems reviewed and are negative.   Updated Vital Signs BP (!) 149/70 (BP Location: Right Arm)   Pulse 82   Temp (!) 97.3 F (36.3 C) (Oral)   Resp 18   Ht 5' 5 (1.651 m)   Wt 54 kg   SpO2 98%   BMI 19.80 kg/m   Physical Exam Vitals and nursing note reviewed.  Constitutional:      Appearance: Normal appearance.  HENT:     Head: Normocephalic and atraumatic.  Eyes:  General:        Right eye: No discharge.        Left eye: No discharge.     Conjunctiva/sclera: Conjunctivae normal.  Pulmonary:     Effort: Pulmonary effort is normal.  Skin:    General: Skin is warm and dry.     Findings: No rash.     Comments: There is an open wound over the left anterior shin.  Some mild ecchymosis with surrounding erythema.  Area is not warm to palpation.  No active bleeding or purulence.  Neurological:     General: No focal deficit present.     Mental Status: She is alert.  Psychiatric:        Mood and Affect: Mood normal.        Behavior: Behavior normal.      (all labs ordered are listed, but only abnormal results are displayed) Labs Reviewed - No data  to display  EKG: None  Radiology: No results found.   Procedures   Medications Ordered in the ED - No data to display   Medical Decision Making Megan Olsen is a 89 y.o. female patient who presents to the emergency room today for further evaluation of a wound check.  This seems to be an acute opening of a chronic wound.  I do not overtly look infected today.  Patient's vital signs are otherwise normal.  There is some surrounding erythema but area is not warm.  Will likely treat conservatively with Keflex.  Patient is overall nontoxic-appearing and does not appear to be septic.  Will discharge this.  She is safe for discharge.   Risk Prescription drug management.     Final diagnoses:  Visit for wound check    ED Discharge Orders          Ordered    doxycycline  (VIBRAMYCIN ) 100 MG capsule  2 times daily        01/20/24 [address] [place], [place] 01/20/24 1627    Theotis Peers Lyons, PA-C 01/20/24 1640    Patsey Lot, MD 01/20/24 2332

## 2024-01-20 NOTE — Discharge Instructions (Signed)
 Please take antibiotic as prescribed.  Please keep the area clean and dry.  I would change the dressing every 24 hours.  Please follow up with your PCP for further evaluation. You may return to the ED for any worsening symptoms.

## 2024-01-20 NOTE — ED Triage Notes (Signed)
 BIB [facility] from [facility] [place] for L shin wound. BLE swelling, L>R. Leg appears chronic and acute. Bumped leg this am. Described as scab dislodged. Skin tears noted. Weeping developed, but no bleeding. Skin bruised, and red. No obvious heat or purulence. Skin not approximated. Denies pain. Denies DM. CBG 114. VSS. Alert, NAD, calm, interactive. Takes eliquis  for afib. See ALF paperwork.

## 2024-01-20 NOTE — ED Notes (Signed)
 PTAR initiated. 6th in que.

## 2024-01-24 DIAGNOSIS — F330 Major depressive disorder, recurrent, mild: Secondary | ICD-10-CM | POA: Diagnosis not present

## 2024-01-24 DIAGNOSIS — F419 Anxiety disorder, unspecified: Secondary | ICD-10-CM | POA: Diagnosis not present

## 2024-01-26 DIAGNOSIS — S81801D Unspecified open wound, right lower leg, subsequent encounter: Secondary | ICD-10-CM | POA: Diagnosis not present

## 2024-01-26 DIAGNOSIS — I129 Hypertensive chronic kidney disease with stage 1 through stage 4 chronic kidney disease, or unspecified chronic kidney disease: Secondary | ICD-10-CM | POA: Diagnosis not present

## 2024-01-26 DIAGNOSIS — I495 Sick sinus syndrome: Secondary | ICD-10-CM | POA: Diagnosis not present

## 2024-01-26 DIAGNOSIS — I4891 Unspecified atrial fibrillation: Secondary | ICD-10-CM | POA: Diagnosis not present

## 2024-01-26 DIAGNOSIS — I959 Hypotension, unspecified: Secondary | ICD-10-CM | POA: Diagnosis not present

## 2024-01-26 DIAGNOSIS — L03115 Cellulitis of right lower limb: Secondary | ICD-10-CM | POA: Diagnosis not present

## 2024-01-26 DIAGNOSIS — I4892 Unspecified atrial flutter: Secondary | ICD-10-CM | POA: Diagnosis not present

## 2024-01-26 DIAGNOSIS — N182 Chronic kidney disease, stage 2 (mild): Secondary | ICD-10-CM | POA: Diagnosis not present

## 2024-01-26 DIAGNOSIS — D509 Iron deficiency anemia, unspecified: Secondary | ICD-10-CM | POA: Diagnosis not present

## 2024-01-31 DIAGNOSIS — R600 Localized edema: Secondary | ICD-10-CM | POA: Diagnosis not present

## 2024-01-31 DIAGNOSIS — S81802A Unspecified open wound, left lower leg, initial encounter: Secondary | ICD-10-CM | POA: Diagnosis not present

## 2024-01-31 DIAGNOSIS — F0390 Unspecified dementia without behavioral disturbance: Secondary | ICD-10-CM | POA: Diagnosis not present

## 2024-02-01 DIAGNOSIS — R600 Localized edema: Secondary | ICD-10-CM | POA: Diagnosis not present

## 2024-02-01 DIAGNOSIS — F0390 Unspecified dementia without behavioral disturbance: Secondary | ICD-10-CM | POA: Diagnosis not present

## 2024-02-01 DIAGNOSIS — F330 Major depressive disorder, recurrent, mild: Secondary | ICD-10-CM | POA: Diagnosis not present

## 2024-02-02 DIAGNOSIS — Z515 Encounter for palliative care: Secondary | ICD-10-CM | POA: Diagnosis not present

## 2024-02-02 DIAGNOSIS — L97829 Non-pressure chronic ulcer of other part of left lower leg with unspecified severity: Secondary | ICD-10-CM | POA: Diagnosis not present

## 2024-02-02 DIAGNOSIS — I739 Peripheral vascular disease, unspecified: Secondary | ICD-10-CM | POA: Diagnosis not present

## 2024-02-02 DIAGNOSIS — F0390 Unspecified dementia without behavioral disturbance: Secondary | ICD-10-CM | POA: Diagnosis not present

## 2024-02-02 DIAGNOSIS — R197 Diarrhea, unspecified: Secondary | ICD-10-CM | POA: Diagnosis not present

## 2024-02-04 DIAGNOSIS — I4891 Unspecified atrial fibrillation: Secondary | ICD-10-CM | POA: Diagnosis not present

## 2024-02-04 DIAGNOSIS — D509 Iron deficiency anemia, unspecified: Secondary | ICD-10-CM | POA: Diagnosis not present

## 2024-02-04 DIAGNOSIS — I959 Hypotension, unspecified: Secondary | ICD-10-CM | POA: Diagnosis not present

## 2024-02-04 DIAGNOSIS — I495 Sick sinus syndrome: Secondary | ICD-10-CM | POA: Diagnosis not present

## 2024-02-04 DIAGNOSIS — L03115 Cellulitis of right lower limb: Secondary | ICD-10-CM | POA: Diagnosis not present

## 2024-02-04 DIAGNOSIS — I4892 Unspecified atrial flutter: Secondary | ICD-10-CM | POA: Diagnosis not present

## 2024-02-04 DIAGNOSIS — I129 Hypertensive chronic kidney disease with stage 1 through stage 4 chronic kidney disease, or unspecified chronic kidney disease: Secondary | ICD-10-CM | POA: Diagnosis not present

## 2024-02-04 DIAGNOSIS — S81801D Unspecified open wound, right lower leg, subsequent encounter: Secondary | ICD-10-CM | POA: Diagnosis not present

## 2024-02-04 DIAGNOSIS — N182 Chronic kidney disease, stage 2 (mild): Secondary | ICD-10-CM | POA: Diagnosis not present

## 2024-02-05 ENCOUNTER — Inpatient Hospital Stay (HOSPITAL_COMMUNITY)
Admission: EM | Admit: 2024-02-05 | Discharge: 2024-02-18 | DRG: 092 | Disposition: E | Source: Skilled Nursing Facility | Attending: Hospitalist | Admitting: Hospitalist

## 2024-02-05 ENCOUNTER — Other Ambulatory Visit: Payer: Self-pay

## 2024-02-05 ENCOUNTER — Emergency Department (HOSPITAL_COMMUNITY)

## 2024-02-05 ENCOUNTER — Encounter (HOSPITAL_COMMUNITY): Payer: Self-pay

## 2024-02-05 DIAGNOSIS — R29810 Facial weakness: Secondary | ICD-10-CM | POA: Diagnosis present

## 2024-02-05 DIAGNOSIS — R42 Dizziness and giddiness: Secondary | ICD-10-CM | POA: Diagnosis present

## 2024-02-05 DIAGNOSIS — I5032 Chronic diastolic (congestive) heart failure: Secondary | ICD-10-CM | POA: Diagnosis present

## 2024-02-05 DIAGNOSIS — R296 Repeated falls: Secondary | ICD-10-CM | POA: Diagnosis present

## 2024-02-05 DIAGNOSIS — F39 Unspecified mood [affective] disorder: Secondary | ICD-10-CM | POA: Diagnosis present

## 2024-02-05 DIAGNOSIS — Z90710 Acquired absence of both cervix and uterus: Secondary | ICD-10-CM

## 2024-02-05 DIAGNOSIS — R2990 Unspecified symptoms and signs involving the nervous system: Secondary | ICD-10-CM | POA: Diagnosis not present

## 2024-02-05 DIAGNOSIS — Z950 Presence of cardiac pacemaker: Secondary | ICD-10-CM

## 2024-02-05 DIAGNOSIS — I951 Orthostatic hypotension: Secondary | ICD-10-CM | POA: Diagnosis present

## 2024-02-05 DIAGNOSIS — R201 Hypoesthesia of skin: Secondary | ICD-10-CM | POA: Diagnosis present

## 2024-02-05 DIAGNOSIS — W19XXXA Unspecified fall, initial encounter: Secondary | ICD-10-CM | POA: Diagnosis not present

## 2024-02-05 DIAGNOSIS — Z9842 Cataract extraction status, left eye: Secondary | ICD-10-CM

## 2024-02-05 DIAGNOSIS — K56609 Unspecified intestinal obstruction, unspecified as to partial versus complete obstruction: Principal | ICD-10-CM | POA: Insufficient documentation

## 2024-02-05 DIAGNOSIS — R64 Cachexia: Secondary | ICD-10-CM | POA: Diagnosis present

## 2024-02-05 DIAGNOSIS — K219 Gastro-esophageal reflux disease without esophagitis: Secondary | ICD-10-CM | POA: Diagnosis present

## 2024-02-05 DIAGNOSIS — Z7901 Long term (current) use of anticoagulants: Secondary | ICD-10-CM | POA: Diagnosis not present

## 2024-02-05 DIAGNOSIS — K56600 Partial intestinal obstruction, unspecified as to cause: Principal | ICD-10-CM | POA: Diagnosis present

## 2024-02-05 DIAGNOSIS — R29818 Other symptoms and signs involving the nervous system: Principal | ICD-10-CM | POA: Diagnosis present

## 2024-02-05 DIAGNOSIS — Z820 Family history of epilepsy and other diseases of the nervous system: Secondary | ICD-10-CM

## 2024-02-05 DIAGNOSIS — I350 Nonrheumatic aortic (valve) stenosis: Secondary | ICD-10-CM | POA: Diagnosis present

## 2024-02-05 DIAGNOSIS — Z880 Allergy status to penicillin: Secondary | ICD-10-CM

## 2024-02-05 DIAGNOSIS — W1830XA Fall on same level, unspecified, initial encounter: Secondary | ICD-10-CM | POA: Diagnosis present

## 2024-02-05 DIAGNOSIS — K838 Other specified diseases of biliary tract: Secondary | ICD-10-CM | POA: Diagnosis not present

## 2024-02-05 DIAGNOSIS — Z96641 Presence of right artificial hip joint: Secondary | ICD-10-CM | POA: Diagnosis present

## 2024-02-05 DIAGNOSIS — I480 Paroxysmal atrial fibrillation: Secondary | ICD-10-CM | POA: Diagnosis present

## 2024-02-05 DIAGNOSIS — Z66 Do not resuscitate: Secondary | ICD-10-CM | POA: Diagnosis present

## 2024-02-05 DIAGNOSIS — E039 Hypothyroidism, unspecified: Secondary | ICD-10-CM | POA: Diagnosis present

## 2024-02-05 DIAGNOSIS — I252 Old myocardial infarction: Secondary | ICD-10-CM

## 2024-02-05 DIAGNOSIS — R1310 Dysphagia, unspecified: Secondary | ICD-10-CM | POA: Diagnosis not present

## 2024-02-05 DIAGNOSIS — R109 Unspecified abdominal pain: Secondary | ICD-10-CM | POA: Diagnosis not present

## 2024-02-05 DIAGNOSIS — R1031 Right lower quadrant pain: Secondary | ICD-10-CM | POA: Diagnosis not present

## 2024-02-05 DIAGNOSIS — Z9841 Cataract extraction status, right eye: Secondary | ICD-10-CM

## 2024-02-05 DIAGNOSIS — I2510 Atherosclerotic heart disease of native coronary artery without angina pectoris: Secondary | ICD-10-CM | POA: Diagnosis present

## 2024-02-05 DIAGNOSIS — E785 Hyperlipidemia, unspecified: Secondary | ICD-10-CM | POA: Diagnosis present

## 2024-02-05 DIAGNOSIS — K5730 Diverticulosis of large intestine without perforation or abscess without bleeding: Secondary | ICD-10-CM | POA: Diagnosis not present

## 2024-02-05 DIAGNOSIS — I517 Cardiomegaly: Secondary | ICD-10-CM | POA: Diagnosis not present

## 2024-02-05 DIAGNOSIS — Z841 Family history of disorders of kidney and ureter: Secondary | ICD-10-CM

## 2024-02-05 DIAGNOSIS — I110 Hypertensive heart disease with heart failure: Secondary | ICD-10-CM | POA: Diagnosis present

## 2024-02-05 DIAGNOSIS — E538 Deficiency of other specified B group vitamins: Secondary | ICD-10-CM | POA: Diagnosis present

## 2024-02-05 DIAGNOSIS — Z888 Allergy status to other drugs, medicaments and biological substances status: Secondary | ICD-10-CM

## 2024-02-05 DIAGNOSIS — R54 Age-related physical debility: Secondary | ICD-10-CM | POA: Diagnosis present

## 2024-02-05 DIAGNOSIS — Z681 Body mass index (BMI) 19 or less, adult: Secondary | ICD-10-CM | POA: Diagnosis not present

## 2024-02-05 DIAGNOSIS — Z7189 Other specified counseling: Secondary | ICD-10-CM | POA: Diagnosis not present

## 2024-02-05 DIAGNOSIS — R200 Anesthesia of skin: Secondary | ICD-10-CM | POA: Diagnosis not present

## 2024-02-05 DIAGNOSIS — I4510 Unspecified right bundle-branch block: Secondary | ICD-10-CM | POA: Diagnosis present

## 2024-02-05 DIAGNOSIS — Z961 Presence of intraocular lens: Secondary | ICD-10-CM | POA: Diagnosis present

## 2024-02-05 DIAGNOSIS — Z8249 Family history of ischemic heart disease and other diseases of the circulatory system: Secondary | ICD-10-CM | POA: Diagnosis not present

## 2024-02-05 DIAGNOSIS — Z8349 Family history of other endocrine, nutritional and metabolic diseases: Secondary | ICD-10-CM

## 2024-02-05 DIAGNOSIS — S81812A Laceration without foreign body, left lower leg, initial encounter: Secondary | ICD-10-CM | POA: Diagnosis present

## 2024-02-05 DIAGNOSIS — R5383 Other fatigue: Secondary | ICD-10-CM | POA: Diagnosis present

## 2024-02-05 DIAGNOSIS — Z8673 Personal history of transient ischemic attack (TIA), and cerebral infarction without residual deficits: Secondary | ICD-10-CM

## 2024-02-05 DIAGNOSIS — I10 Essential (primary) hypertension: Secondary | ICD-10-CM | POA: Diagnosis not present

## 2024-02-05 DIAGNOSIS — I081 Rheumatic disorders of both mitral and tricuspid valves: Secondary | ICD-10-CM | POA: Diagnosis present

## 2024-02-05 DIAGNOSIS — Z515 Encounter for palliative care: Secondary | ICD-10-CM | POA: Diagnosis not present

## 2024-02-05 DIAGNOSIS — R918 Other nonspecific abnormal finding of lung field: Secondary | ICD-10-CM | POA: Diagnosis not present

## 2024-02-05 DIAGNOSIS — R4189 Other symptoms and signs involving cognitive functions and awareness: Secondary | ICD-10-CM | POA: Diagnosis present

## 2024-02-05 DIAGNOSIS — K5669 Other partial intestinal obstruction: Secondary | ICD-10-CM | POA: Diagnosis not present

## 2024-02-05 DIAGNOSIS — K8689 Other specified diseases of pancreas: Secondary | ICD-10-CM | POA: Diagnosis not present

## 2024-02-05 DIAGNOSIS — Z79899 Other long term (current) drug therapy: Secondary | ICD-10-CM

## 2024-02-05 DIAGNOSIS — M79606 Pain in leg, unspecified: Secondary | ICD-10-CM | POA: Diagnosis present

## 2024-02-05 DIAGNOSIS — J90 Pleural effusion, not elsewhere classified: Secondary | ICD-10-CM | POA: Diagnosis not present

## 2024-02-05 LAB — COMPREHENSIVE METABOLIC PANEL WITH GFR
ALT: 14 U/L (ref 0–44)
AST: 26 U/L (ref 15–41)
Albumin: 2.3 g/dL — ABNORMAL LOW (ref 3.5–5.0)
Alkaline Phosphatase: 141 U/L — ABNORMAL HIGH (ref 38–126)
Anion gap: 8 (ref 5–15)
BUN: 18 mg/dL (ref 8–23)
CO2: 24 mmol/L (ref 22–32)
Calcium: 8 mg/dL — ABNORMAL LOW (ref 8.9–10.3)
Chloride: 103 mmol/L (ref 98–111)
Creatinine, Ser: 0.69 mg/dL (ref 0.44–1.00)
GFR, Estimated: >60 mL/min (ref >60)
Glucose, Bld: 103 mg/dL — ABNORMAL HIGH (ref 70–99)
Potassium: 4 mmol/L (ref 3.5–5.1)
Sodium: 135 mmol/L (ref 135–145)
Total Bilirubin: 0.7 mg/dL (ref 0.0–1.2)
Total Protein: 5 g/dL — ABNORMAL LOW (ref 6.5–8.1)

## 2024-02-05 LAB — CBC WITH DIFFERENTIAL/PLATELET
Abs Immature Granulocytes: 0.04 K/uL (ref 0.00–0.07)
Basophils Absolute: 0 K/uL (ref 0.0–0.1)
Basophils Relative: 1 %
Eosinophils Absolute: 0 K/uL (ref 0.0–0.5)
Eosinophils Relative: 1 %
HCT: 32.9 % — ABNORMAL LOW (ref 36.0–46.0)
Hemoglobin: 10.8 g/dL — ABNORMAL LOW (ref 12.0–15.0)
Immature Granulocytes: 1 %
Lymphocytes Relative: 9 %
Lymphs Abs: 0.5 K/uL — ABNORMAL LOW (ref 0.7–4.0)
MCH: 33 pg (ref 26.0–34.0)
MCHC: 32.8 g/dL (ref 30.0–36.0)
MCV: 100.6 fL — ABNORMAL HIGH (ref 80.0–100.0)
Monocytes Absolute: 0.5 K/uL (ref 0.1–1.0)
Monocytes Relative: 8 %
Neutro Abs: 4.6 K/uL (ref 1.7–7.7)
Neutrophils Relative %: 80 %
Platelets: 180 K/uL (ref 150–400)
RBC: 3.27 MIL/uL — ABNORMAL LOW (ref 3.87–5.11)
RDW: 15.5 % (ref 11.5–15.5)
WBC: 5.7 K/uL (ref 4.0–10.5)
nRBC: 0 % (ref 0.0–0.2)

## 2024-02-05 LAB — AMMONIA: Ammonia: 22 umol/L (ref 9–35)

## 2024-02-05 LAB — LIPASE, BLOOD: Lipase: 26 U/L (ref 11–51)

## 2024-02-05 LAB — PHOSPHORUS: Phosphorus: 3.5 mg/dL (ref 2.5–4.6)

## 2024-02-05 LAB — MAGNESIUM: Magnesium: 1.7 mg/dL (ref 1.7–2.4)

## 2024-02-05 LAB — I-STAT CG4 LACTIC ACID, ED: Lactic Acid, Venous: 1.1 mmol/L (ref 0.5–1.9)

## 2024-02-05 LAB — TSH: TSH: 4.167 u[IU]/mL (ref 0.350–4.500)

## 2024-02-05 MED ORDER — ACETAMINOPHEN 500 MG PO TABS
1000.0000 mg | ORAL_TABLET | Freq: Four times a day (QID) | ORAL | Status: DC | PRN
  Administered 2024-02-06: 1000 mg via ORAL
  Filled 2024-02-05: qty 2

## 2024-02-05 MED ORDER — HYDROMORPHONE HCL 1 MG/ML IJ SOLN
0.5000 mg | INTRAMUSCULAR | Status: DC | PRN
  Administered 2024-02-06: 0.5 mg via INTRAVENOUS
  Filled 2024-02-05: qty 1

## 2024-02-05 MED ORDER — IOHEXOL 350 MG/ML SOLN
75.0000 mL | Freq: Once | INTRAVENOUS | Status: AC | PRN
  Administered 2024-02-05: 75 mL via INTRAVENOUS

## 2024-02-05 MED ORDER — DIATRIZOATE MEGLUMINE & SODIUM 66-10 % PO SOLN
90.0000 mL | Freq: Once | ORAL | Status: AC
  Administered 2024-02-06: 90 mL via NASOGASTRIC
  Filled 2024-02-05: qty 90

## 2024-02-05 MED ORDER — MEMANTINE HCL 10 MG PO TABS
5.0000 mg | ORAL_TABLET | Freq: Two times a day (BID) | ORAL | Status: DC
  Administered 2024-02-05 – 2024-02-06 (×3): 5 mg via ORAL
  Filled 2024-02-05 (×3): qty 1

## 2024-02-05 MED ORDER — LACTATED RINGERS IV SOLN: INTRAVENOUS | Status: AC

## 2024-02-05 MED ORDER — OXYCODONE HCL 5 MG PO TABS
5.0000 mg | ORAL_TABLET | ORAL | Status: DC | PRN
  Administered 2024-02-06 (×2): 5 mg via ORAL
  Filled 2024-02-05 (×2): qty 1

## 2024-02-05 MED ORDER — SODIUM CHLORIDE 0.9% FLUSH
3.0000 mL | Freq: Two times a day (BID) | INTRAVENOUS | Status: DC
  Administered 2024-02-05 – 2024-02-06 (×3): 3 mL via INTRAVENOUS

## 2024-02-05 MED ORDER — OXYCODONE HCL 5 MG PO TABS: 2.5000 mg | ORAL_TABLET | ORAL | Status: DC | PRN

## 2024-02-05 MED ORDER — METOPROLOL TARTRATE 12.5 MG HALF TABLET
12.5000 mg | ORAL_TABLET | Freq: Two times a day (BID) | ORAL | Status: DC
  Administered 2024-02-05 – 2024-02-06 (×3): 12.5 mg via ORAL
  Filled 2024-02-05 (×3): qty 1

## 2024-02-05 MED ORDER — POLYETHYLENE GLYCOL 3350 17 G PO PACK: 17.0000 g | PACK | Freq: Every day | ORAL | Status: DC | PRN

## 2024-02-05 MED ORDER — FENTANYL CITRATE PF 50 MCG/ML IJ SOSY
50.0000 ug | PREFILLED_SYRINGE | Freq: Once | INTRAMUSCULAR | Status: DC
  Filled 2024-02-05: qty 1

## 2024-02-05 MED ORDER — MAGNESIUM SULFATE 2 GM/50ML IV SOLN
2.0000 g | Freq: Once | INTRAVENOUS | Status: AC
  Administered 2024-02-06: 2 g via INTRAVENOUS
  Filled 2024-02-05: qty 50

## 2024-02-05 MED ORDER — LACTATED RINGERS IV BOLUS
1000.0000 mL | Freq: Once | INTRAVENOUS | Status: AC
  Administered 2024-02-05: 1000 mL via INTRAVENOUS

## 2024-02-05 MED ORDER — ONDANSETRON HCL 4 MG/2ML IJ SOLN
4.0000 mg | Freq: Four times a day (QID) | INTRAMUSCULAR | Status: DC | PRN
  Administered 2024-02-06 (×2): 4 mg via INTRAVENOUS
  Filled 2024-02-05 (×2): qty 2

## 2024-02-05 MED ORDER — PANTOPRAZOLE SODIUM 40 MG PO TBEC
40.0000 mg | DELAYED_RELEASE_TABLET | Freq: Every day | ORAL | Status: DC
  Administered 2024-02-06: 40 mg via ORAL
  Filled 2024-02-05: qty 1

## 2024-02-05 MED ORDER — ALBUTEROL SULFATE (2.5 MG/3ML) 0.083% IN NEBU: 2.5000 mg | INHALATION_SOLUTION | RESPIRATORY_TRACT | Status: DC | PRN

## 2024-02-05 MED ORDER — MELATONIN 3 MG PO TABS: 6.0000 mg | ORAL_TABLET | Freq: Every evening | ORAL | Status: DC | PRN

## 2024-02-05 NOTE — ED Notes (Signed)
 Assisted pt to beside commode

## 2024-02-05 NOTE — ED Notes (Signed)
 Pt refuses NG tube placement. 1 attempt, pt did not tolerate well. Dr. Ginger notified

## 2024-02-05 NOTE — ED Notes (Signed)
 Pt refusing all medications and IV fluids and states she is going back to [place]. Dr. Keturah notified

## 2024-02-05 NOTE — ED Notes (Signed)
Report to RN on 2W

## 2024-02-05 NOTE — ED Triage Notes (Signed)
 Pt BIB [facility] from [facility] [address] for dizziness and unwitnessed fall. Pt reports feeling dizzy when she woke up and fell when she went to stand. EMS noticed left sided facial droop upon their arrival. LKW 8 pm last night. Pt denies hitting head, takes eliquics. Right lower quadrant abdominal pain, skin tears to both lower legs.   144/76 HR 84 CBG 105

## 2024-02-05 NOTE — Consult Note (Signed)
 Reason for Consult/Chief Complaint: pSBO Consultant: Tegeler, MD  Megan Olsen is an 89 y.o. female.   HPI: 19F on Eliquis  for AF with h/o prior stroke and also with a pacemaker who states she had a fall a few days ago and presents to the ED due to leg pain. Per ED documentation, EMS reports patient was last normal 8 PM last night before going to bed and reportedly had a fall overnight and has skin tears to both legs.  She has previous skin tear on her left shin that is healing.  She is complaining of new abdominal pain of the ED physician but does not know when it began. For me she does not volunteer that she has abdominal pain, but when asked, says it's a little tender, but not hurting, but at times it hurts. She reports her last BM this AM ~0500 and describes it as normal. She denies any nausea or vomiting. She reports abdominal surgery, but cannot recall anything other than appendectomy. Per chart review, she has also had an open chole, TAH, and lap ovarian cystectomy.   EMS reported to nursing that she had a transient left facial droop that has resolved.  She is unsure of previous stroke symptoms.  MRI brain is pending.     Past Medical History:  Diagnosis Date   Age-related macular degeneration, wet, both eyes (HCC)    Anxiety disorder 07/06/2014   Arthritis    all over (04/27/2018)   Atrial fibrillation (HCC)    catheter ablation of SVT in 2002   Benign essential HTN 07/06/2014   Constipation, intermittent 08/04/2007   CVA (cerebrovascular accident) (HCC) 2003   left brain; denies residual on 04/27/2018   Depression    GERD (gastroesophageal reflux disease)    Heart murmur    hx (04/27/2018)   Hypothyroidism 09/07/2014   off RX now (04/27/2018)   Iron deficiency anemia    Iron deficiency anemia, unspecified 01/02/2013   Osteoarthritis    Osteoporosis 01/11/2007   Pneumonia    one time; years and years ago (04/27/2018)   Presence of permanent cardiac pacemaker  04/27/2018    Dual Chamber   Right BBB/left ant fasc block     Past Surgical History:  Procedure Laterality Date   APPENDECTOMY     CATARACT EXTRACTION W/ INTRAOCULAR LENS  IMPLANT, BILATERAL Bilateral    CHOLECYSTECTOMY OPEN     COLONOSCOPY     EYE SURGERY     FRACTURE SURGERY     HIP PINNING,CANNULATED Left 07/07/2014   Procedure: CANNULATED HIP PINNING;  Surgeon: Donnice JONETTA Car, MD;  Location: [facility];  Service: Orthopedics;  Laterality: Left;   INSERT / REPLACE / REMOVE PACEMAKER  04/27/2018    Dual Chamber   JOINT REPLACEMENT     LAPAROSCOPIC OVARIAN CYSTECTOMY     LEFT HEART CATH AND CORONARY ANGIOGRAPHY N/A 01/31/2019   Procedure: LEFT HEART CATH AND CORONARY ANGIOGRAPHY;  Surgeon: Anner Alm ORN, MD;  Location: [facility] INVASIVE CV LAB;  Service: Cardiovascular;  Laterality: N/A;   NASAL SEPTUM SURGERY     PACEMAKER IMPLANT N/A 04/27/2018   Procedure: PACEMAKER IMPLANT - Dual Chamber;  Surgeon: Fernande Elspeth BROCKS, MD;  Location: [facility] INVASIVE CV LAB;  Service: Cardiovascular;  Laterality: N/A;   SVT ABLATION  2002   TONSILLECTOMY     TOTAL ABDOMINAL HYSTERECTOMY     TOTAL HIP ARTHROPLASTY Right 02/23/2017   Procedure: RIGHT TOTAL HIP ARTHROPLASTY ANTERIOR APPROACH;  Surgeon: Car Donnice, MD;  Location: [facility];  Service: Orthopedics;  Laterality: Right;   VITRECTOMY Right 2008   dr Elner.  Vitrectomy and removal of tissue.     Family History  Problem Relation Age of Onset   Early death Mother    Nephritis Mother    Kidney disease Mother    Heart attack Father    Heart disease Sister    Dementia Sister    Heart attack Brother    Dementia Sister     Social History:  reports that she has never smoked. She has never been exposed to tobacco smoke. She has never used smokeless tobacco. She reports that she does not currently use alcohol . She reports that she does not use drugs.  Allergies:  Allergies  Allergen Reactions   Darvon [Propoxyphene] Nausea Only   Propoxyphene  N-Acetaminophen  Nausea Only   Allegra [Fexofenadine] Rash   Celebrex [Celecoxib] Itching and Rash   Penicillins Hives and Rash    Medications: I have reviewed the patient's current medications.  Results for orders placed or performed during the hospital encounter of 02/05/24 (from the past 48 hours)  CBC with Differential     Status: Abnormal   Collection Time: 02/05/24  2:49 PM  Result Value Ref Range   WBC 5.7 4.0 - 10.5 K/uL   RBC 3.27 (L) 3.87 - 5.11 MIL/uL   Hemoglobin 10.8 (L) 12.0 - 15.0 g/dL   HCT 67.0 (L) 63.9 - 53.9 %   MCV 100.6 (H) 80.0 - 100.0 fL   MCH 33.0 26.0 - 34.0 pg   MCHC 32.8 30.0 - 36.0 g/dL   RDW 84.4 88.4 - 84.4 %   Platelets 180 150 - 400 K/uL   nRBC 0.0 0.0 - 0.2 %   Neutrophils Relative % 80 %   Neutro Abs 4.6 1.7 - 7.7 K/uL   Lymphocytes Relative 9 %   Lymphs Abs 0.5 (L) 0.7 - 4.0 K/uL   Monocytes Relative 8 %   Monocytes Absolute 0.5 0.1 - 1.0 K/uL   Eosinophils Relative 1 %   Eosinophils Absolute 0.0 0.0 - 0.5 K/uL   Basophils Relative 1 %   Basophils Absolute 0.0 0.0 - 0.1 K/uL   Immature Granulocytes 1 %   Abs Immature Granulocytes 0.04 0.00 - 0.07 K/uL    Comment: Performed at [facility], [address], [place], [place] [address]  Comprehensive metabolic panel     Status: Abnormal   Collection Time: 02/05/24  2:49 PM  Result Value Ref Range   Sodium 135 135 - 145 mmol/L   Potassium 4.0 3.5 - 5.1 mmol/L   Chloride 103 98 - 111 mmol/L   CO2 24 22 - 32 mmol/L   Glucose, Bld 103 (H) 70 - 99 mg/dL    Comment: Glucose reference range applies only to samples taken after fasting for at least 8 hours.   BUN 18 8 - 23 mg/dL   Creatinine, Ser 9.30 0.44 - 1.00 mg/dL   Calcium 8.0 (L) 8.9 - 10.3 mg/dL   Total Protein 5.0 (L) 6.5 - 8.1 g/dL   Albumin 2.3 (L) 3.5 - 5.0 g/dL   AST 26 15 - 41 U/L   ALT 14 0 - 44 U/L   Alkaline Phosphatase 141 (H) 38 - 126 U/L   Total Bilirubin 0.7 0.0 - 1.2 mg/dL   GFR, Estimated >39 >39 mL/min     Comment: (NOTE) Calculated using the CKD-EPI Creatinine Equation (2021)    Anion gap 8 5 -  15    Comment: Performed at [facility], [address] [place], [place] [address]  TSH     Status: None   Collection Time: 02/05/24  2:49 PM  Result Value Ref Range   TSH 4.167 0.350 - 4.500 uIU/mL    Comment: Performed by a 3rd Generation assay with a functional sensitivity of <=0.01 uIU/mL. Performed at [facility], [address] [place], [place] [address]   Ammonia     Status: None   Collection Time: 02/05/24  2:49 PM  Result Value Ref Range   Ammonia 22 9 - 35 umol/L    Comment: Performed at [facility], [address] [place], [place] [address]  Lipase, blood     Status: None   Collection Time: 02/05/24  2:49 PM  Result Value Ref Range   Lipase 26 11 - 51 U/L    Comment: Performed at [facility], [address] [place], [place] [address]  I-Stat CG4 Lactic Acid     Status: None   Collection Time: 02/05/24  3:01 PM  Result Value Ref Range   Lactic Acid, Venous 1.1 0.5 - 1.9 mmol/L    CT ABDOMEN PELVIS W CONTRAST Result Date: 02/05/2024 CLINICAL DATA:  Abdominal pain, acute, nonlocalized Acute right lower quadrant abdominal tenderness. EXAM: CT ABDOMEN AND PELVIS WITH CONTRAST TECHNIQUE: Multidetector CT imaging of the abdomen and pelvis was performed using the standard protocol following bolus administration of intravenous contrast. RADIATION DOSE REDUCTION: This exam was performed according to the departmental dose-optimization program which includes automated exposure control, adjustment of the mA and/or kV according to patient size and/or use of iterative reconstruction technique. CONTRAST:  75mL OMNIPAQUE  IOHEXOL  350 MG/ML SOLN COMPARISON:  CT abdomen pelvis 12/18/2023 FINDINGS: Lower chest: Small left and trace right pleural effusions. Left lower lobe atelectasis. Cardiomegaly. Mitral annular calcification. Aortic valve leaflet calcification. Coronary  artery calcification. Cardiac leads partially visualized. Hepatobiliary: No focal liver abnormality. Status post cholecystectomy. Persistent central intrahepatic biliary dilatation and common bile duct dilatation measuring up to 11 mm. Pancreas: Diffusely atrophic. No focal lesion. Otherwise normal pancreatic contour. No surrounding inflammatory changes. No main pancreatic ductal dilatation. Spleen: Normal in size without focal abnormality. Adrenals/Urinary Tract: No adrenal nodule bilaterally. Bilateral kidneys enhance symmetrically. Subcentimeter hypodensities are too small to characterize-no further follow-up indicated. No hydronephrosis. No hydroureter. The urinary bladder is unremarkable. Stomach/Bowel: Stomach is within normal limits. No evidence of large bowel wall thickening or dilatation. Fluid dilatation of several loops of proximal jejunum measuring up to 4.2 cm in caliber with possible developing transition point noted within the right anterior mid abdomen (3:39). Associated luminal air-fluid level. Colonic diverticulosis. Appendix appears normal. Vascular/Lymphatic: CT No abdominal aorta or iliac aneurysm. Moderate atherosclerotic plaque of the aorta and its branches. No abdominal, pelvic, or inguinal lymphadenopathy. Reproductive: Not well visualized due to streak artifact originating from the right femoral hardware. Likely hysterectomy. Other: Nonspecific trace pelvic intraperitoneal free fluid. No intraperitoneal free gas. No organized fluid collection. Musculoskeletal: No abdominal wall hernia or abnormality. No suspicious lytic or blastic osseous lesions. No acute displaced fracture. Chronic similar-appearing L1 compression fracture. Multilevel degenerative changes of the spine. Total right hip arthroplasty. Nail fixation of the left femoral neck. IMPRESSION: 1. Developing/early or partial small-bowel obstruction of the proximal jejunum with likely developing transition point within the right  anterior mid abdomen. 2. Small left and trace right pleural effusions. 3. Cholecystectomy with chronic persistent intra and extrahepatic biliary ductal  dilatation. 4. Cardiomegaly. 5. Aortic Atherosclerosis (ICD10-I70.0) including coronary artery, mitral annular, aortic valve leaflet calcifications. Electronically Signed   By: Morgane  Naveau M.D.   On: 02/05/2024 18:48   DG Chest Portable 1 View Result Date: 02/05/2024 CLINICAL DATA:  Provided history: fall, abd pain, neuroogic deficits EXAM: PORTABLE CHEST 1 VIEW COMPARISON:  Chest radiograph 08/05/2023. FINDINGS: Left-sided pacemaker in place. Chronic cardiomegaly, unchanged. Stable mediastinal contours. Aortic atherosclerosis. Left pleural effusion and basilar opacity. No pneumothorax. The lungs are hyperinflated with chronic bronchial thickening. On limited assessment, no evidence of displaced fracture. The bones are diffusely under mineralized. IMPRESSION: 1. Left pleural effusion and basilar opacity. 2. Chronic cardiomegaly. 3. Chronic bronchial thickening. Electronically Signed   By: Andrea Gasman M.D.   On: 02/05/2024 15:49    ROS 10 point review of systems is negative except as listed above in HPI.   Physical Exam Blood pressure (!) 177/95, pulse 89, temperature 98.1 F (36.7 C), temperature source Oral, resp. rate (!) 23, SpO2 100%. Constitutional: well-developed, well-nourished HEENT: pupils equal, round, reactive to light, 2mm b/l, moist conjunctiva, external inspection of ears and nose normal, hearing intact Oropharynx: normal oropharyngeal mucosa, poor dentition Neck: no thyromegaly, trachea midline, no midline cervical tenderness to palpation Chest: breath sounds equal bilaterally, normal respiratory effort, no midline or lateral chest wall tenderness to palpation/deformity Abdomen: soft, distended, mildly tender, no bruising, no hepatosplenomegaly GU: normal female genitalia  Extremities: 2+ radial and pedal pulses bilaterally,  intact motor and sensation bilateral UE and LE, no peripheral edema Psych: normal memory, normal mood/affect     Assessment/Plan: pSBO - SBO protocol ordered. Since no n/v and recent BM, okay to drink contrast. Otherwise, keep strict NPO. Recommend checking mag/phos to ensure adequate electrolyte repletion. K noted to be 4. Recommend goals of mag 2+, phos 3+, K 4+. Avoid narcotics.  Stroke symptoms - per primary, MRI pending FEN - strict NPO DVT - SCDs, LMWH recommended Dispo - per primary    Dreama GEANNIE Hanger, MD General and Trauma Surgery [facility]

## 2024-02-05 NOTE — ED Notes (Signed)
 CCMD called.

## 2024-02-05 NOTE — ED Provider Notes (Signed)
  EMERGENCY DEPARTMENT AT [facility] Provider Note   CSN: [id number] Arrival date & time: 02/05/24  1409     Patient presents with: No chief complaint on file.   Megan Olsen is a 89 y.o. female.   The history is provided by the patient and medical records. No language interpreter was used.  Fall This is a recurrent problem. The problem occurs rarely. The problem has not changed since onset.Associated symptoms include abdominal pain. Pertinent negatives include no chest pain, no headaches and no shortness of breath. Nothing aggravates the symptoms. Nothing relieves the symptoms. She has tried nothing for the symptoms. The treatment provided no relief.       Prior to Admission medications   Medication Sig Start Date End Date Taking? Authorizing Provider  acetaminophen  (TYLENOL ) 325 MG tablet Take 650 mg by mouth every 8 (eight) hours as needed for moderate pain (pain score 4-6) or mild pain (pain score 1-3).    [provider]  apixaban  (ELIQUIS ) 2.5 MG TABS tablet TAKE 1 TABLET TWICE DAILY 01/19/22   Fernande Elspeth BROCKS, MD  cyanocobalamin  1000 MCG tablet Take 1 tablet (1,000 mcg total) by mouth daily. 12/21/23   Gonfa, Taye T, MD  dexlansoprazole  (DEXILANT ) 60 MG capsule TAKE 1 CAPSULE EVERY DAY 11/12/21   Koberlein, Junell C, MD  doxycycline  (VIBRAMYCIN ) 100 MG capsule Take 1 capsule (100 mg total) by mouth 2 (two) times daily. 01/20/24   Theotis Peers M, PA-C  fluticasone  (FLONASE ) 50 MCG/ACT nasal spray PLACE 2 SPRAYS INTO BOTH NOSTRILS DAILY. 09/05/21   Koberlein, Junell C, MD  furosemide  (LASIX ) 20 MG tablet Take 1 tablet (20 mg total) by mouth daily as needed for fluid or edema. 12/20/23   Gonfa, Taye T, MD  guaiFENesin  (GERI-TUSSIN PO) Take 10 mLs by mouth every 6 (six) hours as needed (cough).    [provider]  LACTOBACILLUS PO Take 1 capsule by mouth 3 (three) times daily. For 15 days, 5/19-12/20/23.    [provider]  loperamide  (IMODIUM A-D) 2 MG tablet Take 2 mg by mouth every 8 (eight) hours as needed for diarrhea or loose stools.    [provider]  memantine  (NAMENDA ) 5 MG tablet Take 5 mg by mouth 2 (two) times daily.    [provider]  metoprolol  tartrate (LOPRESSOR ) 25 MG tablet Take 0.5 tablets (12.5 mg total) by mouth 2 (two) times daily. 12/20/23 12/19/24  Gonfa, Taye T, MD  Multiple Vitamins-Minerals (PRESERVISION AREDS 2 PO) Take 1 capsule by mouth 2 (two) times daily.    [provider]  nitroGLYCERIN  (NITROSTAT ) 0.4 MG SL tablet Place 1 tablet (0.4 mg total) under the tongue every 5 (five) minutes x 3 doses as needed for chest pain. 02/01/19   Samtani, Jai-Gurmukh, MD  Simethicone  125 MG CAPS Take 125 mg by mouth every 6 (six) hours as needed (indigestion).    [provider]    Allergies: Darvon [propoxyphene], Propoxyphene n-acetaminophen , Allegra [fexofenadine], Celebrex [celecoxib], and Penicillins    Review of Systems  Constitutional:  Positive for fatigue. Negative for chills and fever.  HENT:  Negative for congestion.   Respiratory:  Negative for cough, chest tightness, shortness of breath and wheezing.   Cardiovascular:  Negative for chest pain.  Gastrointestinal:  Positive for abdominal pain. Negative for constipation, diarrhea, nausea and vomiting.  Genitourinary:  Negative for dysuria, flank pain and frequency.  Musculoskeletal:  Negative for back pain, neck pain and neck stiffness.  Skin:  Positive for wound.  Neurological:  Positive for dizziness, weakness (per ems transietn face droop), light-headedness and numbness. Negative for speech difficulty and headaches.  Psychiatric/Behavioral:  Negative for agitation.   All other systems reviewed and are negative.   Updated Vital Signs BP (!) 171/96   Pulse 99   Temp 98.1 F (36.7 C) (Oral)   Resp 15   SpO2 98%   Physical Exam Vitals and nursing note reviewed.  Constitutional:      General: She is not  in acute distress.    Appearance: She is well-developed. She is not ill-appearing, toxic-appearing or diaphoretic.  HENT:     Head: Normocephalic and atraumatic.     Nose: No congestion or rhinorrhea.     Mouth/Throat:     Mouth: Mucous membranes are moist.     Pharynx: No oropharyngeal exudate or posterior oropharyngeal erythema.  Eyes:     Extraocular Movements: Extraocular movements intact.     Conjunctiva/sclera: Conjunctivae normal.     Pupils: Pupils are equal, round, and reactive to light.  Cardiovascular:     Rate and Rhythm: Normal rate and regular rhythm.     Pulses: Normal pulses.     Heart sounds: No murmur heard. Pulmonary:     Effort: Pulmonary effort is normal. No respiratory distress.     Breath sounds: Normal breath sounds. No wheezing, rhonchi or rales.  Chest:     Chest wall: No tenderness.  Abdominal:     General: Abdomen is flat.     Palpations: Abdomen is soft.     Tenderness: There is abdominal tenderness.  Musculoskeletal:        General: No swelling or tenderness.     Cervical back: Neck supple.  Skin:    General: Skin is warm and dry.     Capillary Refill: Capillary refill takes less than 2 seconds.     Findings: Bruising present. No erythema.  Neurological:     Mental Status: She is alert.     Sensory: Sensory deficit present.     Motor: No weakness.  Psychiatric:        Mood and Affect: Mood normal.     (all labs ordered are listed, but only abnormal results are displayed) Labs Reviewed  CBC WITH DIFFERENTIAL/PLATELET - Abnormal; Notable for the following components:      Result Value   RBC 3.27 (*)    Hemoglobin 10.8 (*)    HCT 32.9 (*)    MCV 100.6 (*)    Lymphs Abs 0.5 (*)    All other components within normal limits  COMPREHENSIVE METABOLIC PANEL WITH GFR - Abnormal; Notable for the following components:   Glucose, Bld 103 (*)    Calcium 8.0 (*)    Total Protein 5.0 (*)    Albumin 2.3 (*)    Alkaline Phosphatase 141 (*)    All  other components within normal limits  TSH  AMMONIA  LIPASE, BLOOD  URINALYSIS, W/ REFLEX TO CULTURE (INFECTION SUSPECTED)  BASIC METABOLIC PANEL WITH GFR  CBC  MAGNESIUM   PHOSPHORUS  PROTIME-INR  TSH  I-STAT CG4 LACTIC ACID, ED    EKG: EKG Interpretation Date/Time:  Saturday February 05 2024 14:25:37 EDT Ventricular Rate:  69 PR Interval:    QRS Duration:  124 QT Interval:  428 QTC Calculation: 472 R Axis:   81  Text Interpretation: Atrial fibrillation Right bundle branch block some paced beats no sTEMI Confirmed by Ginger Barefoot ([id number]) on 02/05/2024 2:39:37 PM  Radiology:  CT ANGIO HEAD NECK W WO CM Result Date: 02/05/2024 EXAM: CTA HEAD AND NECK WITH AND WITHOUT 02/05/2024 05:33:34 PM TECHNIQUE: CTA of the head and neck was performed with and without the administration of intravenous contrast. Multiplanar 2D and/or 3D reformatted images are provided for review. Automated exposure control, iterative reconstruction, and/or weight based adjustment of the mA/kV was utilized to reduce the radiation dose to as low as reasonably achievable. Stenosis of the internal carotid arteries measured using NASCET criteria. COMPARISON: 12/18/2023, 01/30/19 CLINICAL HISTORY: Neuro deficit, acute, stroke suspected; Right sided numbness in right face, right arm, and right leg. Unsure if new. History of stroke. Fall today. Chief complaints; Dizziness; Fall; CT ANGIO HEAD NECK W WO CM; Neuro deficit, acute, stroke suspected, Right sided numbness in right face, right arm, and right .; CT ABDOMEN PELVIS W CONTRAST; Abdominal pain, acute, nonlocalized, Acute right lower quadrant abdominal tenderness. FINDINGS: CTA NECK: AORTIC ARCH AND ARCH VESSELS: No dissection or arterial injury. No significant stenosis of the brachiocephalic or subclavian arteries. Calcific aortic atherosclerosis. CERVICAL CAROTID ARTERIES: No dissection, arterial injury, or hemodynamically significant stenosis by NASCET criteria. Mild  atherosclerosis at both carotid bifurcations without hemodynamically significant stenosis. Calcific atherosclerosis of both internal carotid arteries at the skull base without hemodynamically significant stenosis. CERVICAL VERTEBRAL ARTERIES: No dissection, arterial injury, or significant stenosis. Mild atherosclerosis of the left vertebral artery V4 segment but otherwise normal vertebral arteries. LUNGS AND MEDIASTINUM: 5 mm ground-glass density nodule in the right lung apex. Small left pleural effusion. SOFT TISSUES: No acute abnormality. BONES: No acute abnormality. CTA HEAD: ANTERIOR CIRCULATION: No significant stenosis of the internal carotid arteries. No significant stenosis of the anterior cerebral arteries. No significant stenosis of the middle cerebral arteries. No aneurysm. Fetal origin of the left PCA. POSTERIOR CIRCULATION: No significant stenosis of the posterior cerebral arteries. No significant stenosis of the basilar artery. No significant stenosis of the vertebral arteries. No aneurysm. OTHER: No dural venous sinus thrombosis on this non-dedicated study. IMPRESSION: 1. No large vessel occlusion, hemodynamically significant stenosis, or aneurysm in the head or neck. 2. Mild atherosclerosis at both carotid bifurcations and left vertebral artery V4 segment without hemodynamically significant stenosis. Electronically signed by: Franky Stanford MD 02/05/2024 08:06 PM EDT RP Workstation: [id number]   CT ABDOMEN PELVIS W CONTRAST Result Date: 02/05/2024 CLINICAL DATA:  Abdominal pain, acute, nonlocalized Acute right lower quadrant abdominal tenderness. EXAM: CT ABDOMEN AND PELVIS WITH CONTRAST TECHNIQUE: Multidetector CT imaging of the abdomen and pelvis was performed using the standard protocol following bolus administration of intravenous contrast. RADIATION DOSE REDUCTION: This exam was performed according to the departmental dose-optimization program which includes automated exposure control,  adjustment of the mA and/or kV according to patient size and/or use of iterative reconstruction technique. CONTRAST:  75mL OMNIPAQUE  IOHEXOL  350 MG/ML SOLN COMPARISON:  CT abdomen pelvis 12/18/2023 FINDINGS: Lower chest: Small left and trace right pleural effusions. Left lower lobe atelectasis. Cardiomegaly. Mitral annular calcification. Aortic valve leaflet calcification. Coronary artery calcification. Cardiac leads partially visualized. Hepatobiliary: No focal liver abnormality. Status post cholecystectomy. Persistent central intrahepatic biliary dilatation and common bile duct dilatation measuring up to 11 mm. Pancreas: Diffusely atrophic. No focal lesion. Otherwise normal pancreatic contour. No surrounding inflammatory changes. No main pancreatic ductal dilatation. Spleen: Normal in size without focal abnormality. Adrenals/Urinary Tract: No adrenal nodule bilaterally. Bilateral kidneys enhance symmetrically. Subcentimeter hypodensities are too small to characterize-no further follow-up indicated. No hydronephrosis. No hydroureter. The urinary bladder is unremarkable. Stomach/Bowel: Stomach is within normal limits.  No evidence of large bowel wall thickening or dilatation. Fluid dilatation of several loops of proximal jejunum measuring up to 4.2 cm in caliber with possible developing transition point noted within the right anterior mid abdomen (3:39). Associated luminal air-fluid level. Colonic diverticulosis. Appendix appears normal. Vascular/Lymphatic: CT No abdominal aorta or iliac aneurysm. Moderate atherosclerotic plaque of the aorta and its branches. No abdominal, pelvic, or inguinal lymphadenopathy. Reproductive: Not well visualized due to streak artifact originating from the right femoral hardware. Likely hysterectomy. Other: Nonspecific trace pelvic intraperitoneal free fluid. No intraperitoneal free gas. No organized fluid collection. Musculoskeletal: No abdominal wall hernia or abnormality. No  suspicious lytic or blastic osseous lesions. No acute displaced fracture. Chronic similar-appearing L1 compression fracture. Multilevel degenerative changes of the spine. Total right hip arthroplasty. Nail fixation of the left femoral neck. IMPRESSION: 1. Developing/early or partial small-bowel obstruction of the proximal jejunum with likely developing transition point within the right anterior mid abdomen. 2. Small left and trace right pleural effusions. 3. Cholecystectomy with chronic persistent intra and extrahepatic biliary ductal dilatation. 4. Cardiomegaly. 5. Aortic Atherosclerosis (ICD10-I70.0) including coronary artery, mitral annular, aortic valve leaflet calcifications. Electronically Signed   By: Morgane  Naveau M.D.   On: 02/05/2024 18:48   DG Chest Portable 1 View Result Date: 02/05/2024 CLINICAL DATA:  Provided history: fall, abd pain, neuroogic deficits EXAM: PORTABLE CHEST 1 VIEW COMPARISON:  Chest radiograph 08/05/2023. FINDINGS: Left-sided pacemaker in place. Chronic cardiomegaly, unchanged. Stable mediastinal contours. Aortic atherosclerosis. Left pleural effusion and basilar opacity. No pneumothorax. The lungs are hyperinflated with chronic bronchial thickening. On limited assessment, no evidence of displaced fracture. The bones are diffusely under mineralized. IMPRESSION: 1. Left pleural effusion and basilar opacity. 2. Chronic cardiomegaly. 3. Chronic bronchial thickening. Electronically Signed   By: Andrea Gasman M.D.   On: 02/05/2024 15:49     Procedures   Medications Ordered in the ED  fentaNYL  (SUBLIMAZE ) injection 50 mcg (0 mcg Intravenous Hold 02/05/24 1800)  diatrizoate  meglumine -sodium (GASTROGRAFIN ) 66-10 % solution 90 mL (0 mLs Per NG tube Hold 02/05/24 1943)  iohexol  (OMNIPAQUE ) 350 MG/ML injection 75 mL (75 mLs Intravenous Contrast Given 02/05/24 1735)                                    Medical Decision Making Amount and/or Complexity of Data Reviewed Labs:  ordered. Radiology: ordered.  Risk Prescription drug management. Decision regarding hospitalization.    Megan Olsen is a 89 y.o. female with a past medical history significant for atrial fibrillation on Eliquis  therapy, pacemaker, GERD, previous stroke with unknown deficits, GERD, osteoporosis, anxiety, depression, and hypothyroidism who presents for fall and neurologic complaint.  According to EMS report to nursing, patient was last normal 8 PM last night before going to bed.  She reportedly had a fall overnight and has skin tears to both legs.  She has previous skin tear on her left shin that is healing.  She is complaining of new abdominal pain but does not know when it began.  She is denying fevers, chills, congestion, cough.  She denies chest pain or shortness of breath.  She denies any headache or neck pain.  EMS reported to nursing that she had a transient left facial droop that has resolved.  She is unsure of previous stroke symptoms.  She feels tired with some lightheadedness and some dizziness but does not feel any dizziness now.  On my exam,  lungs were clear.  Chest nontender.  Abdomen is quite tender in the right lower quadrant.  Back and flanks nontender.  I did not appreciate a bruit.  Neck nontender.  Face nontender.  For me she had a symmetric smile with no facial droop.  Clear speech.  Pupils symmetric and reactive with normal extraocular movements.  Intact strength in all extremities with no focal weakness on my exam.  She did have numbness in her right face, right arm, and right leg but she is unsure if this is new or not.  She was able to do finger-nose-finger testing.  Clinically I am concerned about this new right sided numbness in her right face right arm and right leg and this reported transient facial droop that I cannot see.  We will get CTA of the head and neck given the neurologic deficit and we will also order MRI of the brain.  Will order CT abdomen pelvis given the  abdominal pain and tenderness.  Will get screening labs.  Her tetanus shot appears to be up-to-date.  Will look for other abnormality.  Given her severe abdominal pain, anticipate patient may need admission after workup is completed  Given the right sided, I did call neurology who agreed with getting MRI and if stroke is seen, they agreed with consultation.  They agree with admission to medicine for the bowel troubles and falls and fatigue.  Will call for admission and if MRI does show stroke, neurology can be formally called.      Final diagnoses:  Small bowel obstruction (HCC)  Abdominal pain, unspecified abdominal location  Fatigue, unspecified type  Lightheadedness  Fall, initial encounter     Clinical Impression: 1. Small bowel obstruction (HCC)   2. Abdominal pain, unspecified abdominal location   3. Fatigue, unspecified type   4. Lightheadedness   5. Fall, initial encounter     Disposition: Admit  This note was prepared with assistance of Conservation officer, historic buildings. Occasional wrong-word or sound-a-like substitutions may have occurred due to the inherent limitations of voice recognition software.     Angello Chien, Lonni PARAS, MD 02/05/24 2034

## 2024-02-05 NOTE — H&P (Signed)
 History and Physical    Megan Olsen FMW:[id number] DOB: November 01, 1927 DOA: 02/05/2024  PCP: Ozell Heron HERO, MD   Patient coming from: LTC    Chief Complaint:  Chief Complaint  Patient presents with   Dizziness   Fall    HPI:  Megan Olsen is a 89 y.o. female with hx of CVA, CAD/non-STEMI, A-fib on Eliquis , SSS/PPM, right THA, chronic IDA, HTN, HLD, GERD, hypothyroidism, anxiety and depression brought to ED from [facility] ALF after GLF. She is not able to provide much details about the fall. Denies syncopal episode or chest pain, palpitations. She has pain mainly in her lower legs. She reports recurrent falls and recalls last hospitalization for the same. She reports having constipation but still passing BM daily and did this morning. She does have generalized abd pain. She is not sure about deficits from old stroke. Denies any recent headache, speech / vision changes, numbness / weakness. She is OK with hospitalization, and diagnostic testing, monitoring; MOST is DNR / comfort although she is not currently on hospice.    Review of Systems:  ROS complete and negative except as marked above   Allergies  Allergen Reactions   Darvon [Propoxyphene] Nausea Only   Propoxyphene N-Acetaminophen  Nausea Only   Allegra [Fexofenadine] Rash   Celebrex [Celecoxib] Itching and Rash   Penicillins Hives and Rash    Prior to Admission medications   Medication Sig Start Date End Date Taking? Authorizing Provider  cyanocobalamin  1000 MCG tablet Take 1 tablet (1,000 mcg total) by mouth daily. 12/21/23  Yes Gonfa, Taye T, MD  dexlansoprazole  (DEXILANT ) 60 MG capsule TAKE 1 CAPSULE EVERY DAY 11/12/21  Yes Koberlein, Junell C, MD  acetaminophen  (TYLENOL ) 325 MG tablet Take 650 mg by mouth every 8 (eight) hours as needed for moderate pain (pain score 4-6) or mild pain (pain score 1-3).    [provider]  apixaban  (ELIQUIS ) 2.5 MG TABS tablet TAKE 1 TABLET TWICE DAILY 01/19/22   Fernande Elspeth BROCKS, MD  doxycycline  (VIBRAMYCIN ) 100 MG capsule Take 1 capsule (100 mg total) by mouth 2 (two) times daily. 01/20/24   Theotis Peers M, PA-C  fluticasone  (FLONASE ) 50 MCG/ACT nasal spray PLACE 2 SPRAYS INTO BOTH NOSTRILS DAILY. 09/05/21   Koberlein, Junell C, MD  furosemide  (LASIX ) 20 MG tablet Take 1 tablet (20 mg total) by mouth daily as needed for fluid or edema. 12/20/23   Gonfa, Taye T, MD  guaiFENesin  (GERI-TUSSIN PO) Take 10 mLs by mouth every 6 (six) hours as needed (cough).    [provider]  LACTOBACILLUS PO Take 1 capsule by mouth 3 (three) times daily. For 15 days, 5/19-12/20/23.    [provider]  loperamide (IMODIUM A-D) 2 MG tablet Take 2 mg by mouth every 8 (eight) hours as needed for diarrhea or loose stools.    [provider]  memantine  (NAMENDA ) 5 MG tablet Take 5 mg by mouth 2 (two) times daily.    [provider]  metoprolol  tartrate (LOPRESSOR ) 25 MG tablet Take 0.5 tablets (12.5 mg total) by mouth 2 (two) times daily. 12/20/23 12/19/24  Gonfa, Taye T, MD  Multiple Vitamins-Minerals (PRESERVISION AREDS 2 PO) Take 1 capsule by mouth 2 (two) times daily.    [provider]  nitroGLYCERIN  (NITROSTAT ) 0.4 MG SL tablet Place 1 tablet (0.4 mg total) under the tongue every 5 (five) minutes x 3 doses as needed for chest pain. 02/01/19   Samtani, Jai-Gurmukh, MD  Simethicone  125 MG CAPS  Take 125 mg by mouth every 6 (six) hours as needed (indigestion).    [provider]    Past Medical History:  Diagnosis Date   Age-related macular degeneration, wet, both eyes (HCC)    Anxiety disorder 07/06/2014   Arthritis    all over (04/27/2018)   Atrial fibrillation (HCC)    catheter ablation of SVT in 2002   Benign essential HTN 07/06/2014   Constipation, intermittent 08/04/2007   CVA (cerebrovascular accident) (HCC) 2003   left brain; denies residual on 04/27/2018   Depression    GERD (gastroesophageal reflux disease)    Heart murmur    hx  (04/27/2018)   Hypothyroidism 09/07/2014   off RX now (04/27/2018)   Iron deficiency anemia    Iron deficiency anemia, unspecified 01/02/2013   Osteoarthritis    Osteoporosis 01/11/2007   Pneumonia    one time; years and years ago (04/27/2018)   Presence of permanent cardiac pacemaker 04/27/2018    Dual Chamber   Right BBB/left ant fasc block     Past Surgical History:  Procedure Laterality Date   APPENDECTOMY     CATARACT EXTRACTION W/ INTRAOCULAR LENS  IMPLANT, BILATERAL Bilateral    CHOLECYSTECTOMY OPEN     COLONOSCOPY     EYE SURGERY     FRACTURE SURGERY     HIP PINNING,CANNULATED Left 07/07/2014   Procedure: CANNULATED HIP PINNING;  Surgeon: Donnice JONETTA Car, MD;  Location: [facility];  Service: Orthopedics;  Laterality: Left;   INSERT / REPLACE / REMOVE PACEMAKER  04/27/2018    Dual Chamber   JOINT REPLACEMENT     LAPAROSCOPIC OVARIAN CYSTECTOMY     LEFT HEART CATH AND CORONARY ANGIOGRAPHY N/A 01/31/2019   Procedure: LEFT HEART CATH AND CORONARY ANGIOGRAPHY;  Surgeon: Anner Alm ORN, MD;  Location: [facility] INVASIVE CV LAB;  Service: Cardiovascular;  Laterality: N/A;   NASAL SEPTUM SURGERY     PACEMAKER IMPLANT N/A 04/27/2018   Procedure: PACEMAKER IMPLANT - Dual Chamber;  Surgeon: Fernande Elspeth BROCKS, MD;  Location: [facility] INVASIVE CV LAB;  Service: Cardiovascular;  Laterality: N/A;   SVT ABLATION  2002   TONSILLECTOMY     TOTAL ABDOMINAL HYSTERECTOMY     TOTAL HIP ARTHROPLASTY Right 02/23/2017   Procedure: RIGHT TOTAL HIP ARTHROPLASTY ANTERIOR APPROACH;  Surgeon: Car Donnice, MD;  Location: [facility];  Service: Orthopedics;  Laterality: Right;   VITRECTOMY Right 2008   dr Elner.  Vitrectomy and removal of tissue.      reports that she has never smoked. She has never been exposed to tobacco smoke. She has never used smokeless tobacco. She reports that she does not currently use alcohol . She reports that she does not use drugs.  Family History  Problem Relation Age of Onset   Early death  Mother    Nephritis Mother    Kidney disease Mother    Heart attack Father    Heart disease Sister    Dementia Sister    Heart attack Brother    Dementia Sister      Physical Exam: Vitals:   02/05/24 2000 02/05/24 2030 02/05/24 2100 02/05/24 2234  BP: (!) 171/96 (!) 148/88 (!) 153/71 (!) 169/88  Pulse: 99 100 100 87  Resp: 15 (!) 23 19 18   Temp:    (!) 97.5 F (36.4 C)  TempSrc:      SpO2: 98% 98% 98% 100%    Gen: Awake, alert, chronically ill-appearing, weak, frail.  Cachectic HEENT: Head atraumatic Neck: C-spine nontender  CV: Regular, normal S1, S2, 1/6 SEM Resp: Normal WOB, CTAB  Abd: Round and slightly distended, hypoactive, there is moderate diffuse tenderness, mild rebound.  No guarding, rigidity.  MSK: asymmetric edema 1+ on the L. Ranges all extremities without pain, joints are nontender.  Skin: Small skin tears over both anterior shins.  No other rashes or lesions to exposed skin Neuro: Alert and interactive, oriented except to year, CN V hypoesthesia throughout R face. CN VII ? R lower  facial weakness, Motor is globally 4/5 but symmetric. Sensation intact to fine touch, hypoesthesia in R arm / leg.  Psych: euthymic, appropriate    Data review:   Labs reviewed, notable for:   Chemistries unremarkable Alk phos 141 Hemoglobin 10 up from prior TSH within normal limit   Micro:  Results for orders placed or performed during the hospital encounter of 08/05/23  Resp panel by RT-PCR (RSV, Flu A&B, Covid) Anterior Nasal Swab     Status: Abnormal   Collection Time: 08/05/23  1:39 PM   Specimen: Anterior Nasal Swab  Result Value Ref Range Status   SARS Coronavirus 2 by RT PCR NEGATIVE NEGATIVE Final    Comment: (NOTE) SARS-CoV-2 target nucleic acids are NOT DETECTED.  The SARS-CoV-2 RNA is generally detectable in upper respiratory specimens during the acute phase of infection. The lowest concentration of SARS-CoV-2 viral copies this assay can detect is 138  copies/mL. A negative result does not preclude SARS-Cov-2 infection and should not be used as the sole basis for treatment or other patient management decisions. A negative result may occur with  improper specimen collection/handling, submission of specimen other than nasopharyngeal swab, presence of viral mutation(s) within the areas targeted by this assay, and inadequate number of viral copies(<138 copies/mL). A negative result must be combined with clinical observations, patient history, and epidemiological information. The expected result is Negative.  Fact Sheet for Patients:  BloggerCourse.com  Fact Sheet for Healthcare Providers:  SeriousBroker.it  This test is no t yet approved or cleared by the [place]  FDA and  has been authorized for detection and/or diagnosis of SARS-CoV-2 by FDA under an Emergency Use Authorization (EUA). This EUA will remain  in effect (meaning this test can be used) for the duration of the COVID-19 declaration under Section 564(b)(1) of the Act, 21 U.S.C.section 360bbb-3(b)(1), unless the authorization is terminated  or revoked sooner.       Influenza A by PCR NEGATIVE NEGATIVE Final   Influenza B by PCR NEGATIVE NEGATIVE Final    Comment: (NOTE) The Xpert Xpress SARS-CoV-2/FLU/RSV plus assay is intended as an aid in the diagnosis of influenza from Nasopharyngeal swab specimens and should not be used as a sole basis for treatment. Nasal washings and aspirates are unacceptable for Xpert Xpress SARS-CoV-2/FLU/RSV testing.  Fact Sheet for Patients: BloggerCourse.com  Fact Sheet for Healthcare Providers: SeriousBroker.it  This test is not yet approved or cleared by the [place]  FDA and has been authorized for detection and/or diagnosis of SARS-CoV-2 by FDA under an Emergency Use Authorization (EUA). This EUA will remain in effect (meaning  this test can be used) for the duration of the COVID-19 declaration under Section 564(b)(1) of the Act, 21 U.S.C. section 360bbb-3(b)(1), unless the authorization is terminated or revoked.     Resp Syncytial Virus by PCR POSITIVE (A) NEGATIVE Final    Comment: (NOTE) Fact Sheet for Patients: BloggerCourse.com  Fact Sheet for Healthcare Providers: SeriousBroker.it  This test is not yet approved or cleared by the [place]  [place] FDA and has been authorized for detection and/or diagnosis of SARS-CoV-2 by FDA under an Emergency Use Authorization (EUA). This EUA will remain in effect (meaning this test can be used) for the duration of the COVID-19 declaration under Section 564(b)(1) of the Act, 21 U.S.C. section 360bbb-3(b)(1), unless the authorization is terminated or revoked.  Performed at [facility], [address], [place], [place] [address]     Imaging reviewed:  CT ANGIO HEAD NECK W WO CM Result Date: 02/05/2024 EXAM: CTA HEAD AND NECK WITH AND WITHOUT 02/05/2024 05:33:34 PM TECHNIQUE: CTA of the head and neck was performed with and without the administration of intravenous contrast. Multiplanar 2D and/or 3D reformatted images are provided for review. Automated exposure control, iterative reconstruction, and/or weight based adjustment of the mA/kV was utilized to reduce the radiation dose to as low as reasonably achievable. Stenosis of the internal carotid arteries measured using NASCET criteria. COMPARISON: 12/18/2023, 01/30/19 CLINICAL HISTORY: Neuro deficit, acute, stroke suspected; Right sided numbness in right face, right arm, and right leg. Unsure if new. History of stroke. Fall today. Chief complaints; Dizziness; Fall; CT ANGIO HEAD NECK W WO CM; Neuro deficit, acute, stroke suspected, Right sided numbness in right face, right arm, and right .; CT ABDOMEN PELVIS W CONTRAST; Abdominal pain, acute, nonlocalized,  Acute right lower quadrant abdominal tenderness. FINDINGS: CTA NECK: AORTIC ARCH AND ARCH VESSELS: No dissection or arterial injury. No significant stenosis of the brachiocephalic or subclavian arteries. Calcific aortic atherosclerosis. CERVICAL CAROTID ARTERIES: No dissection, arterial injury, or hemodynamically significant stenosis by NASCET criteria. Mild atherosclerosis at both carotid bifurcations without hemodynamically significant stenosis. Calcific atherosclerosis of both internal carotid arteries at the skull base without hemodynamically significant stenosis. CERVICAL VERTEBRAL ARTERIES: No dissection, arterial injury, or significant stenosis. Mild atherosclerosis of the left vertebral artery V4 segment but otherwise normal vertebral arteries. LUNGS AND MEDIASTINUM: 5 mm ground-glass density nodule in the right lung apex. Small left pleural effusion. SOFT TISSUES: No acute abnormality. BONES: No acute abnormality. CTA HEAD: ANTERIOR CIRCULATION: No significant stenosis of the internal carotid arteries. No significant stenosis of the anterior cerebral arteries. No significant stenosis of the middle cerebral arteries. No aneurysm. Fetal origin of the left PCA. POSTERIOR CIRCULATION: No significant stenosis of the posterior cerebral arteries. No significant stenosis of the basilar artery. No significant stenosis of the vertebral arteries. No aneurysm. OTHER: No dural venous sinus thrombosis on this non-dedicated study. IMPRESSION: 1. No large vessel occlusion, hemodynamically significant stenosis, or aneurysm in the head or neck. 2. Mild atherosclerosis at both carotid bifurcations and left vertebral artery V4 segment without hemodynamically significant stenosis. Electronically signed by: Franky Stanford MD 02/05/2024 08:06 PM EDT RP Workstation: [id number]   CT ABDOMEN PELVIS W CONTRAST Result Date: 02/05/2024 CLINICAL DATA:  Abdominal pain, acute, nonlocalized Acute right lower quadrant abdominal tenderness.  EXAM: CT ABDOMEN AND PELVIS WITH CONTRAST TECHNIQUE: Multidetector CT imaging of the abdomen and pelvis was performed using the standard protocol following bolus administration of intravenous contrast. RADIATION DOSE REDUCTION: This exam was performed according to the departmental dose-optimization program which includes automated exposure control, adjustment of the mA and/or kV according to patient size and/or use of iterative reconstruction technique. CONTRAST:  75mL OMNIPAQUE  IOHEXOL  350 MG/ML SOLN COMPARISON:  CT abdomen pelvis 12/18/2023 FINDINGS: Lower chest: Small left and trace right pleural effusions. Left lower lobe atelectasis. Cardiomegaly. Mitral annular calcification. Aortic valve leaflet calcification. Coronary artery calcification. Cardiac leads partially visualized. Hepatobiliary: No focal liver abnormality. Status post  cholecystectomy. Persistent central intrahepatic biliary dilatation and common bile duct dilatation measuring up to 11 mm. Pancreas: Diffusely atrophic. No focal lesion. Otherwise normal pancreatic contour. No surrounding inflammatory changes. No main pancreatic ductal dilatation. Spleen: Normal in size without focal abnormality. Adrenals/Urinary Tract: No adrenal nodule bilaterally. Bilateral kidneys enhance symmetrically. Subcentimeter hypodensities are too small to characterize-no further follow-up indicated. No hydronephrosis. No hydroureter. The urinary bladder is unremarkable. Stomach/Bowel: Stomach is within normal limits. No evidence of large bowel wall thickening or dilatation. Fluid dilatation of several loops of proximal jejunum measuring up to 4.2 cm in caliber with possible developing transition point noted within the right anterior mid abdomen (3:39). Associated luminal air-fluid level. Colonic diverticulosis. Appendix appears normal. Vascular/Lymphatic: CT No abdominal aorta or iliac aneurysm. Moderate atherosclerotic plaque of the aorta and its branches. No abdominal,  pelvic, or inguinal lymphadenopathy. Reproductive: Not well visualized due to streak artifact originating from the right femoral hardware. Likely hysterectomy. Other: Nonspecific trace pelvic intraperitoneal free fluid. No intraperitoneal free gas. No organized fluid collection. Musculoskeletal: No abdominal wall hernia or abnormality. No suspicious lytic or blastic osseous lesions. No acute displaced fracture. Chronic similar-appearing L1 compression fracture. Multilevel degenerative changes of the spine. Total right hip arthroplasty. Nail fixation of the left femoral neck. IMPRESSION: 1. Developing/early or partial small-bowel obstruction of the proximal jejunum with likely developing transition point within the right anterior mid abdomen. 2. Small left and trace right pleural effusions. 3. Cholecystectomy with chronic persistent intra and extrahepatic biliary ductal dilatation. 4. Cardiomegaly. 5. Aortic Atherosclerosis (ICD10-I70.0) including coronary artery, mitral annular, aortic valve leaflet calcifications. Electronically Signed   By: Morgane  Naveau M.D.   On: 02/05/2024 18:48   DG Chest Portable 1 View Result Date: 02/05/2024 CLINICAL DATA:  Provided history: fall, abd pain, neuroogic deficits EXAM: PORTABLE CHEST 1 VIEW COMPARISON:  Chest radiograph 08/05/2023. FINDINGS: Left-sided pacemaker in place. Chronic cardiomegaly, unchanged. Stable mediastinal contours. Aortic atherosclerosis. Left pleural effusion and basilar opacity. No pneumothorax. The lungs are hyperinflated with chronic bronchial thickening. On limited assessment, no evidence of displaced fracture. The bones are diffusely under mineralized. IMPRESSION: 1. Left pleural effusion and basilar opacity. 2. Chronic cardiomegaly. 3. Chronic bronchial thickening. Electronically Signed   By: Andrea Gasman M.D.   On: 02/05/2024 15:49    EKG: \ Personally reviewed ? SR -> V paced, RBBB, no acute ischemic changes  ED Course:  EDP consulted  Gen surg for partial SBO, and discussed with Neurology re: ? Of stroke like symptoms, recommending for MRI     Assessment/Plan:  89 y.o. female with hx CVA, CAD/non-STEMI, A-fib on Eliquis , SSS/PPM, HFpEF, valvular disease, HTN, HLD, hypothyroidism, chronic IDA, B12 deficiency, chronic biliary dilation, anxiety and depression, right THA. L hip ORIF, recent fall admitted from 5/31 - 6/2, who is brought to ED from [facility] after recurrent GLF.  Incidental finding of early / partial SBO, and possible stroke-like symptoms of unclear chronicity.    Ground level fall, recurrent  Hx recent orthostatic hypotension  Details are unclear, suspect mechanical. Previously noted to have orthostasis and BP meds were stopped last admission. Past eval with orthostasis TTE with significant change from prior in 2020. TSH and cortisol normal. CT angio head neck without acute process, A/P without traumatic injuries.  - Check orthostatics after additional hydration in a.m. - Pacemaker interrogation when able; unsure how to order for this  - PT/OT evaluation  Early/partial small bowel obstruction Reports still having bowel movement daily.  Does have moderate tenderness  and mild rebound on exam.  CT abdomen pelvis with developing/early partial SBO of proximal jejunum with likely developing transition point within the right anterior mid abdomen. NG was attempted in the ED but patient did not tolerate  - EDP consulted general surgery, has been seen by Dr. Paola, has ordered for SBO protocol imaging, keep NPO.  - Give 1 L LR and start on 75 cc an hour while NPO - Hold DOAC although doubt patient wants to proceed with surgery intervention given goals - Symptomatic management Tylenol  as needed mild, oxycodone  2.5/5 mg for moderate/severe, Dilaudid  0.5 mg IV every 4 hours as needed for breakthrough. Zofran  prn nausea.   R sided hypoesthesia ? Chronicity  Per EMS felt to have left facial droop, on my exam, ? R lower  facial weakness. + R hypoesthesia. Unknown onset, pt without subjective complaints.  CTA head and neck no acute intracranial process, no LVO or hemodynamically significant stenosis. - EDP discussed with neurology recommended for MRI brain.  If positive will need neurology evaluation and stroke workup. - Telemonitoring  GOC  MOST form says DNR/Comfort. She is not currently on hospice care but would be a candidate.  Appears to evaluate time out of the hospital.  However currently okay with admission, diagnostic testing and monitoring.  - TOC/palliative consult ordered for possible transition to hospice care if she is interested  Chronic medical problems: Hx CVA: suspect deficits above may be chronic from this. On DOAC for hx Afib currently held. Not on cholesterol lowering agents.  CAD / hx NSTEMI: see CVA.  Paroxysmal Afib: Currently Eliquis  held in case requires operative intervention. Continue home Metoprolol  BID  SSS/PPM: Noted,  Chronic HFpEF/severe mitral and tricuspid valve regurgitation/moderate aortic valve stenosis: Holding lasix .  Hypertension: Continue pretenses have been stopped with her recent orthostatic hypotension. Hyperlipidemia: Not on cholesterol-lowering agents Hypothyroidism: Not on thyroid  replacement, TSH within normal limit. IDA, B12 deficiency: Hemoglobin up from baseline likely hemoconcentrated. Chronic biliary dilation: Stable on imaging, isolated elevated Alk phos with other LFT wnl  Recurrent ground-level falls, T11/L1 endplate compression fractures, osteoporosis: Stable appearance on CT.  Mood d/o: Not on medication, ativan  trazodone  discontinued last admit  Cognitive impairment: A+O except to time.  Continue home memantine    There is no height or weight on file to calculate BMI.    DVT prophylaxis:  SCDs Code Status:  DNR/DNI(Do NOT Intubate); confirmed with patient  Diet:  Diet Orders (From admission, onward)     Start     Ordered   02/05/24 2033  Diet  NPO time specified Except for: Sips with Meds  Diet effective now       Question:  Except for  Answer:  Noralyn with Meds   02/05/24 2034           Family Communication:  None   Consults:  General surgery, palliative   Admission status:   Inpatient, Telemetry bed  Severity of Illness: The appropriate patient status for this patient is INPATIENT. Inpatient status is judged to be reasonable and necessary in order to provide the required intensity of service to ensure the patient's safety. The patient's presenting symptoms, physical exam findings, and initial radiographic and laboratory data in the context of their chronic comorbidities is felt to place them at high risk for further clinical deterioration. Furthermore, it is not anticipated that the patient will be medically stable for discharge from the hospital within 2 midnights of admission.   * I certify that at the point of  admission it is my clinical judgment that the patient will require inpatient hospital care spanning beyond 2 midnights from the point of admission due to high intensity of service, high risk for further deterioration and high frequency of surveillance required.*   Dorn Dawson, MD [facility]  How to contact the [facility] Attending or Consulting provider 7A - 7P or covering provider during after hours 7P -7A, for this patient.  Check the care team in [facility] and look for a) attending/consulting [facility] provider listed and b) the [facility] team listed Log into [email or web address] and use [facility]'s universal password to access. If you do not have the password, please contact the hospital operator. Locate the [facility] provider you are looking for under [facility] and page to a number that you can be directly reached. If you still have difficulty reaching the provider, please page the [facility] (Director on Call) for the Hospitalists listed on amion for assistance.  02/05/2024, 11:08 PM

## 2024-02-05 NOTE — Plan of Care (Signed)

## 2024-02-06 ENCOUNTER — Inpatient Hospital Stay (HOSPITAL_COMMUNITY)

## 2024-02-06 DIAGNOSIS — Z7189 Other specified counseling: Secondary | ICD-10-CM | POA: Diagnosis not present

## 2024-02-06 DIAGNOSIS — Z515 Encounter for palliative care: Secondary | ICD-10-CM

## 2024-02-06 DIAGNOSIS — W1830XA Fall on same level, unspecified, initial encounter: Secondary | ICD-10-CM | POA: Diagnosis not present

## 2024-02-06 DIAGNOSIS — K56609 Unspecified intestinal obstruction, unspecified as to partial versus complete obstruction: Secondary | ICD-10-CM | POA: Diagnosis not present

## 2024-02-06 LAB — MAGNESIUM
Magnesium: 2.1 mg/dL (ref 1.7–2.4)
Magnesium: 1.9 mg/dL (ref 1.7–2.4)

## 2024-02-06 LAB — CBC
HCT: 31.2 % — ABNORMAL LOW (ref 36.0–46.0)
Hemoglobin: 10.4 g/dL — ABNORMAL LOW (ref 12.0–15.0)
MCH: 33.1 pg (ref 26.0–34.0)
MCHC: 33.3 g/dL (ref 30.0–36.0)
MCV: 99.4 fL (ref 80.0–100.0)
Platelets: 193 K/uL (ref 150–400)
RBC: 3.14 MIL/uL — ABNORMAL LOW (ref 3.87–5.11)
RDW: 15.5 % (ref 11.5–15.5)
WBC: 6.5 K/uL (ref 4.0–10.5)
nRBC: 0 % (ref 0.0–0.2)

## 2024-02-06 LAB — BASIC METABOLIC PANEL WITH GFR
Anion gap: 11 (ref 5–15)
Anion gap: 15 (ref 5–15)
BUN: 17 mg/dL (ref 8–23)
BUN: 22 mg/dL (ref 8–23)
CO2: 22 mmol/L (ref 22–32)
CO2: 22 mmol/L (ref 22–32)
Calcium: 7.9 mg/dL — ABNORMAL LOW (ref 8.9–10.3)
Calcium: 8.2 mg/dL — ABNORMAL LOW (ref 8.9–10.3)
Chloride: 102 mmol/L (ref 98–111)
Chloride: 100 mmol/L (ref 98–111)
Creatinine, Ser: 0.6 mg/dL (ref 0.44–1.00)
Creatinine, Ser: 0.91 mg/dL (ref 0.44–1.00)
GFR, Estimated: >60 mL/min (ref >60)
GFR, Estimated: 58 mL/min — ABNORMAL LOW (ref >60)
Glucose, Bld: 86 mg/dL (ref 70–99)
Glucose, Bld: 86 mg/dL (ref 70–99)
Potassium: 3.8 mmol/L (ref 3.5–5.1)
Potassium: 3.3 mmol/L — ABNORMAL LOW (ref 3.5–5.1)
Sodium: 135 mmol/L (ref 135–145)
Sodium: 137 mmol/L (ref 135–145)

## 2024-02-06 LAB — PROTIME-INR
INR: 1.1 (ref 0.8–1.2)
Prothrombin Time: 15.3 s — ABNORMAL HIGH (ref 11.4–15.2)

## 2024-02-06 LAB — PHOSPHORUS: Phosphorus: 4 mg/dL (ref 2.5–4.6)

## 2024-02-06 LAB — TSH: TSH: 4.833 u[IU]/mL — ABNORMAL HIGH (ref 0.350–4.500)

## 2024-02-06 MED ORDER — LACTATED RINGERS IV SOLN: INTRAVENOUS | Status: DC

## 2024-02-06 MED ORDER — METOPROLOL TARTRATE 5 MG/5ML IV SOLN: 5.0000 mg | Freq: Four times a day (QID) | INTRAVENOUS | Status: DC

## 2024-02-06 MED ORDER — METOPROLOL TARTRATE 5 MG/5ML IV SOLN
5.0000 mg | INTRAVENOUS | Status: AC
  Administered 2024-02-06: 5 mg via INTRAVENOUS
  Filled 2024-02-06: qty 5

## 2024-02-06 MED ORDER — SODIUM CHLORIDE 0.9 % IV SOLN
12.5000 mg | Freq: Once | INTRAVENOUS | Status: AC
  Administered 2024-02-06: 12.5 mg via INTRAVENOUS
  Filled 2024-02-06: qty 12.5

## 2024-02-06 NOTE — Evaluation (Signed)
 Occupational Therapy Evaluation Patient Details Name: Megan Olsen MRN: [id number] DOB: 01-19-1928 Today's Date: 02/06/2024   History of Present Illness   Pt is a 89 y.o. female admitted to [facility] on 02/05/24 for fall at her ALF. CT demonstrated early partial SBO. Pt with recent admit on 5/31 for falls resulting in hematoma on L pelvis. PMH: macular degeneration, a-fib, HTN, CVA, OA, osteoporosis     Clinical Impressions Pt admitted for above, PTA pt residing at ALF and ind with ADLs, ambulating mod I with RW. Pt currently presenting with impaired balance and activity tolerance, c/o dizziness and nausea during standing but BP not reading as orthostatic. She was able to rise into standing with CGA,, up to min A to help steady with prolonged standing during BP assessment. Pt not able to tolerate ambulation secondary to listed symptoms and needs further assessment. OT to continue following pt acutely to address listed deficits and help transition to next level of care.   Orthostatic VS for the past 24 hrs:  BP- Lying BP- Sitting BP- Standing at 0 minutes  02/06/24 1113 126/55 129/78 120/76  *Pt unable to tolerate 3 min standing test.        If plan is discharge home, recommend the following:   A little help with walking and/or transfers;Assistance with cooking/housework;Assist for transportation     Functional Status Assessment   Patient has had a recent decline in their functional status and demonstrates the ability to make significant improvements in function in a reasonable and predictable amount of time.     Equipment Recommendations   None recommended by OT     Recommendations for Other Services         Precautions/Restrictions   Precautions Precautions: Fall Precaution/Restrictions Comments: watch HR. Restrictions Weight Bearing Restrictions Per Provider Order: No     Mobility Bed Mobility Overal bed mobility: Needs Assistance Bed Mobility: Supine to Sit,  Sit to Supine     Supine to sit: Supervision Sit to supine: Supervision   General bed mobility comments: increased time.    Transfers Overall transfer level: Needs assistance Equipment used: Rolling walker (2 wheels) Transfers: Sit to/from Stand Sit to Stand: Contact guard assist           General transfer comment: from EOB      Balance Overall balance assessment: Mild deficits observed, not formally tested                                         ADL either performed or assessed with clinical judgement   ADL   Eating/Feeding: Independent;Sitting   Grooming: Bed level;Set up           Upper Body Dressing : Sitting;Set up   Lower Body Dressing: Set up;Sitting/lateral leans                       Vision Baseline Vision/History: 0 No visual deficits Patient Visual Report: No change from baseline Vision Assessment?: No apparent visual deficits     Perception         Praxis         Pertinent Vitals/Pain Pain Assessment Pain Assessment: No/denies pain     Extremity/Trunk Assessment Upper Extremity Assessment Upper Extremity Assessment: Generalized weakness   Lower Extremity Assessment Lower Extremity Assessment: Defer to PT evaluation       Communication Communication Communication: No  apparent difficulties   Cognition Arousal: Alert Behavior During Therapy: WFL for tasks assessed/performed Cognition: No apparent impairments             OT - Cognition Comments: A&Ox4                 Following commands: Intact       Cueing  General Comments   Cueing Techniques: Verbal cues  see orthostatics section.   Exercises     Shoulder Instructions      Home Living Family/patient expects to be discharged to:: Assisted living                             Home Equipment: Cane - single point;Grab bars - toilet;Grab bars - tub/shower;Shower Counsellor (2 wheels)   Additional Comments:  [facility]. Prior notes indicate 3 falls in the last month, pt unsure of how many more      Prior Functioning/Environment Prior Level of Function : Independent/Modified Independent             Mobility Comments: Mod i with RW ADLs Comments: Ind    OT Problem List: Impaired balance (sitting and/or standing);Decreased strength   OT Treatment/Interventions: Self-care/ADL training;Patient/family education;Balance training;Therapeutic exercise;Therapeutic activities;DME and/or AE instruction      OT Goals(Current goals can be found in the care plan section)   Acute Rehab OT Goals Patient Stated Goal: To figure out cause of it all OT Goal Formulation: With patient Time For Goal Achievement: 02/20/24 Potential to Achieve Goals: Good ADL Goals Pt Will Perform Grooming: with modified independence;standing Pt Will Perform Lower Body Bathing: with set-up;sitting/lateral leans Pt Will Perform Lower Body Dressing: with modified independence;sit to/from stand Pt Will Transfer to Toilet: with modified independence;ambulating   OT Frequency:  Min 2X/week    Co-evaluation              AM-PAC OT 6 Clicks Daily Activity     Outcome Measure Help from another person eating meals?: None Help from another person taking care of personal grooming?: A Little Help from another person toileting, which includes using toliet, bedpan, or urinal?: Total Help from another person bathing (including washing, rinsing, drying)?: A Little Help from another person to put on and taking off regular upper body clothing?: A Little Help from another person to put on and taking off regular lower body clothing?: A Little 6 Click Score: 17   End of Session Equipment Utilized During Treatment: Gait belt;Rolling walker (2 wheels) Nurse Communication: Mobility status  Activity Tolerance: Treatment limited secondary to medical complications (Comment) Patient left: in bed;with call bell/phone within  reach;with bed alarm set  OT Visit Diagnosis: Unsteadiness on feet (R26.81);Muscle weakness (generalized) (M62.81);History of falling (Z91.81)                Time: 8994-8970 OT Time Calculation (min): 24 min Charges:  OT General Charges $OT Visit: 1 Visit OT Evaluation $OT Eval Low Complexity: 1 Low OT Treatments $Therapeutic Activity: 8-22 mins  02/06/2024  AB, OTR/L  Acute Rehabilitation Services  Office: [phone]   Curtistine JONETTA Das 02/06/2024, 11:17 AM

## 2024-02-06 NOTE — Evaluation (Signed)
 Physical Therapy Evaluation Patient Details Name: Megan Olsen MRN: [id number] DOB: Feb 07, 1928 Today's Date: 02/06/2024  History of Present Illness  Pt is a 89 y.o. female admitted to [facility] on 02/05/24 for fall at her ALF. CT demonstrated early partial SBO. Pt with recent admit on 5/31 for falls resulting in hematoma on L pelvis. PMH: macular degeneration, a-fib, HTN, CVA, OA, osteoporosis  Clinical Impression  Pt had just vomited and writhing from stomach pain. Pt reports dizziness with positional change which increases her nausea. Evaluation limited to assessment while trying to make patient more comfortable, pt lying on her back with her knees drawn up and with cuing is able to lie on her side in a fetal position. Pt unable to full participate in visual screen but appears to have slight nystagmus, pt report prior hx of vertigo. Pt requesting pain medication for 10/10 abdominal pain and doesn't remember her RN giving it to her. RN report giving Dilaudid  and Zofran  10 min prior to PT session. Pt also reports she just wants to die. PT will have Vestibular PT assess pt for possible vertigo flare. Per notes Hospice is following. PT will continue to follow acutely until plans are more clear.         If plan is discharge home, recommend the following: A little help with walking and/or transfers;A little help with bathing/dressing/bathroom;Assistance with cooking/housework;Direct supervision/assist for medications management;Direct supervision/assist for financial management;Assist for transportation;Help with stairs or ramp for entrance   Can travel by private vehicle    No     Equipment Recommendations None recommended by PT     Functional Status Assessment Patient has had a recent decline in their functional status and demonstrates the ability to make significant improvements in function in a reasonable and predictable amount of time.     Precautions / Restrictions Precautions Precautions:  Fall Precaution/Restrictions Comments: watch HR. Restrictions Weight Bearing Restrictions Per Provider Order: No      Mobility  Bed Mobility Overal bed mobility: Needs Assistance Bed Mobility: Rolling Rolling: Supervision         General bed mobility comments: supervision to roll on to her side for more comfortable positioning    Transfers                   General transfer comment: deferred due to pain, nausea and vomiting             Pertinent Vitals/Pain Pain Assessment Pain Assessment: 0-10 Pain Score: 10-Worst pain ever Breathing: normal Negative Vocalization: none Facial Expression: smiling or inexpressive Body Language: relaxed Consolability: no need to console PAINAD Score: 0 Pain Location: stomach Pain Descriptors / Indicators: Sharp, Pressure, Stabbing Pain Intervention(s): Limited activity within patient's tolerance, Monitored during session, Repositioned, Premedicated before session    Home Living Family/patient expects to be discharged to:: Assisted living                 Home Equipment: Cane - single point;Grab bars - toilet;Grab bars - tub/shower;Shower Counsellor (2 wheels) Additional Comments: [facility]. Prior notes indicate 3 falls in the last month, pt unsure of how many more    Prior Function Prior Level of Function : Independent/Modified Independent             Mobility Comments: Mod i with RW ADLs Comments: Ind     Extremity/Trunk Assessment   Upper Extremity Assessment Upper Extremity Assessment: Defer to OT evaluation    Lower Extremity Assessment Lower Extremity Assessment: LLE deficits/detail;Generalized  weakness LLE Deficits / Details: ROM WFL, strength <R       Communication   Communication Communication: No apparent difficulties    Cognition Arousal: Alert Behavior During Therapy: WFL for tasks assessed/performed                             Following commands:  Intact       Cueing Cueing Techniques: Verbal cues     General Comments General comments (skin integrity, edema, etc.): pt with vomiting prior to PT arrival laying in bed with knees bent up writhing in pain, reporting dizziness and pain in her abdomen, difficulty seeing with her L eye and L sided weakness noted        Assessment/Plan    PT Assessment Patient needs continued PT services  PT Problem List Decreased strength;Decreased activity tolerance;Decreased mobility       PT Treatment Interventions DME instruction;Gait training;Functional mobility training;Therapeutic activities;Therapeutic exercise;Balance training;Cognitive remediation    PT Goals (Current goals can be found in the Care Plan section)  Acute Rehab PT Goals PT Goal Formulation: Patient unable to participate in goal setting Time For Goal Achievement: 02/20/24 Potential to Achieve Goals: Fair    Frequency Min 2X/week        AM-PAC PT 6 Clicks Mobility  Outcome Measure Help needed turning from your back to your side while in a flat bed without using bedrails?: None Help needed moving from lying on your back to sitting on the side of a flat bed without using bedrails?: None Help needed moving to and from a bed to a chair (including a wheelchair)?: None Help needed standing up from a chair using your arms (e.g., wheelchair or bedside chair)?: None Help needed to walk in hospital room?: A Little Help needed climbing 3-5 steps with a railing? : A Lot 6 Click Score: 21    End of Session   Activity Tolerance: Patient limited by pain Patient left: in bed;with call bell/phone within reach;with bed alarm set Nurse Communication: Mobility status;Patient requests pain meds (RN reports that pt had just received Dilaudid  and Zofran  but does not remember) PT Visit Diagnosis: Muscle weakness (generalized) (M62.81);Other symptoms and signs involving the nervous system (R29.898)    Time: 8284-8270 PT Time  Calculation (min) (ACUTE ONLY): 14 min   Charges:   PT Evaluation $PT Eval Moderate Complexity: 1 Mod   PT General Charges $$ ACUTE PT VISIT: 1 Visit         [name] PT, DPT Acute Rehabilitation Services Please use secure chat or  Call Office [phone]   [name] [facility] 02/06/2024, 6:31 PM

## 2024-02-06 NOTE — Plan of Care (Signed)

## 2024-02-06 NOTE — Progress Notes (Signed)
 Progress Note     Interval: No nausea or emesis since admission. Not passing flatus or having bowel movements  Objective: Vital signs in last 24 hours: Temp:  [97.5 F (36.4 C)-98.1 F (36.7 C)] 98.1 F (36.7 C) (07/20 0359) Pulse Rate:  [67-100] 67 (07/20 0359) Resp:  [15-24] 16 (07/19 2343) BP: (148-189)/(71-107) 160/76 (07/20 0359) SpO2:  [96 %-100 %] 96 % (07/20 0359) Weight:  [46.6 kg] 46.6 kg (07/19 2343) Last BM Date : 02/05/24  Intake/Output from previous day: 07/19 0701 - 07/20 0700 In: 100 [P.O.:50; IV Piggyback:50] Out: 650 [Urine:650] Intake/Output this shift: No intake/output data recorded.  PE: Gen: NAD CV: Regular rate, hypertensive Pulm: NWOB on room air Abd: Soft, minimally distended, nontender   Lab Results:  Recent Labs    02/05/24 1449 02/06/24 0217  WBC 5.7 6.5  HGB 10.8* 10.4*  HCT 32.9* 31.2*  PLT 180 193   BMET Recent Labs    02/05/24 1449 02/06/24 0217  NA 135 135  K 4.0 3.8  CL 103 102  CO2 24 22  GLUCOSE 103* 86  BUN 18 17  CREATININE 0.69 0.60  CALCIUM 8.0* 7.9*   PT/INR Recent Labs    02/06/24 0217  LABPROT 15.3*  INR 1.1   CMP     Component Value Date/Time   NA 135 02/06/2024 0217   NA 140 11/07/2021 1520   K 3.8 02/06/2024 0217   CL 102 02/06/2024 0217   CO2 22 02/06/2024 0217   GLUCOSE 86 02/06/2024 0217   BUN 17 02/06/2024 0217   BUN 20 11/07/2021 1520   CREATININE 0.60 02/06/2024 0217   CREATININE 0.78 05/31/2020 1558   CALCIUM 7.9 (L) 02/06/2024 0217   PROT 5.0 (L) 02/05/2024 1449   ALBUMIN 2.3 (L) 02/05/2024 1449   AST 26 02/05/2024 1449   ALT 14 02/05/2024 1449   ALKPHOS 141 (H) 02/05/2024 1449   BILITOT 0.7 02/05/2024 1449   GFRNONAA >60 02/06/2024 0217   GFRAA 60 03/24/2019 1438   Lipase     Component Value Date/Time   LIPASE 26 02/05/2024 1449       Studies/Results: CT ANGIO HEAD NECK W WO CM Result Date: 02/05/2024 EXAM: CTA HEAD AND NECK WITH AND WITHOUT 02/05/2024 05:33:34  PM TECHNIQUE: CTA of the head and neck was performed with and without the administration of intravenous contrast. Multiplanar 2D and/or 3D reformatted images are provided for review. Automated exposure control, iterative reconstruction, and/or weight based adjustment of the mA/kV was utilized to reduce the radiation dose to as low as reasonably achievable. Stenosis of the internal carotid arteries measured using NASCET criteria. COMPARISON: 12/18/2023, 01/30/19 CLINICAL HISTORY: Neuro deficit, acute, stroke suspected; Right sided numbness in right face, right arm, and right leg. Unsure if new. History of stroke. Fall today. Chief complaints; Dizziness; Fall; CT ANGIO HEAD NECK W WO CM; Neuro deficit, acute, stroke suspected, Right sided numbness in right face, right arm, and right .; CT ABDOMEN PELVIS W CONTRAST; Abdominal pain, acute, nonlocalized, Acute right lower quadrant abdominal tenderness. FINDINGS: CTA NECK: AORTIC ARCH AND ARCH VESSELS: No dissection or arterial injury. No significant stenosis of the brachiocephalic or subclavian arteries. Calcific aortic atherosclerosis. CERVICAL CAROTID ARTERIES: No dissection, arterial injury, or hemodynamically significant stenosis by NASCET criteria. Mild atherosclerosis at both carotid bifurcations without hemodynamically significant stenosis. Calcific atherosclerosis of both internal carotid arteries at the skull base without hemodynamically significant stenosis. CERVICAL VERTEBRAL ARTERIES: No dissection, arterial injury, or significant stenosis. Mild atherosclerosis of  the left vertebral artery V4 segment but otherwise normal vertebral arteries. LUNGS AND MEDIASTINUM: 5 mm ground-glass density nodule in the right lung apex. Small left pleural effusion. SOFT TISSUES: No acute abnormality. BONES: No acute abnormality. CTA HEAD: ANTERIOR CIRCULATION: No significant stenosis of the internal carotid arteries. No significant stenosis of the anterior cerebral arteries. No  significant stenosis of the middle cerebral arteries. No aneurysm. Fetal origin of the left PCA. POSTERIOR CIRCULATION: No significant stenosis of the posterior cerebral arteries. No significant stenosis of the basilar artery. No significant stenosis of the vertebral arteries. No aneurysm. OTHER: No dural venous sinus thrombosis on this non-dedicated study. IMPRESSION: 1. No large vessel occlusion, hemodynamically significant stenosis, or aneurysm in the head or neck. 2. Mild atherosclerosis at both carotid bifurcations and left vertebral artery V4 segment without hemodynamically significant stenosis. Electronically signed by: Franky Stanford MD 02/05/2024 08:06 PM EDT RP Workstation: [id number]   CT ABDOMEN PELVIS W CONTRAST Result Date: 02/05/2024 CLINICAL DATA:  Abdominal pain, acute, nonlocalized Acute right lower quadrant abdominal tenderness. EXAM: CT ABDOMEN AND PELVIS WITH CONTRAST TECHNIQUE: Multidetector CT imaging of the abdomen and pelvis was performed using the standard protocol following bolus administration of intravenous contrast. RADIATION DOSE REDUCTION: This exam was performed according to the departmental dose-optimization program which includes automated exposure control, adjustment of the mA and/or kV according to patient size and/or use of iterative reconstruction technique. CONTRAST:  75mL OMNIPAQUE  IOHEXOL  350 MG/ML SOLN COMPARISON:  CT abdomen pelvis 12/18/2023 FINDINGS: Lower chest: Small left and trace right pleural effusions. Left lower lobe atelectasis. Cardiomegaly. Mitral annular calcification. Aortic valve leaflet calcification. Coronary artery calcification. Cardiac leads partially visualized. Hepatobiliary: No focal liver abnormality. Status post cholecystectomy. Persistent central intrahepatic biliary dilatation and common bile duct dilatation measuring up to 11 mm. Pancreas: Diffusely atrophic. No focal lesion. Otherwise normal pancreatic contour. No surrounding inflammatory  changes. No main pancreatic ductal dilatation. Spleen: Normal in size without focal abnormality. Adrenals/Urinary Tract: No adrenal nodule bilaterally. Bilateral kidneys enhance symmetrically. Subcentimeter hypodensities are too small to characterize-no further follow-up indicated. No hydronephrosis. No hydroureter. The urinary bladder is unremarkable. Stomach/Bowel: Stomach is within normal limits. No evidence of large bowel wall thickening or dilatation. Fluid dilatation of several loops of proximal jejunum measuring up to 4.2 cm in caliber with possible developing transition point noted within the right anterior mid abdomen (3:39). Associated luminal air-fluid level. Colonic diverticulosis. Appendix appears normal. Vascular/Lymphatic: CT No abdominal aorta or iliac aneurysm. Moderate atherosclerotic plaque of the aorta and its branches. No abdominal, pelvic, or inguinal lymphadenopathy. Reproductive: Not well visualized due to streak artifact originating from the right femoral hardware. Likely hysterectomy. Other: Nonspecific trace pelvic intraperitoneal free fluid. No intraperitoneal free gas. No organized fluid collection. Musculoskeletal: No abdominal wall hernia or abnormality. No suspicious lytic or blastic osseous lesions. No acute displaced fracture. Chronic similar-appearing L1 compression fracture. Multilevel degenerative changes of the spine. Total right hip arthroplasty. Nail fixation of the left femoral neck. IMPRESSION: 1. Developing/early or partial small-bowel obstruction of the proximal jejunum with likely developing transition point within the right anterior mid abdomen. 2. Small left and trace right pleural effusions. 3. Cholecystectomy with chronic persistent intra and extrahepatic biliary ductal dilatation. 4. Cardiomegaly. 5. Aortic Atherosclerosis (ICD10-I70.0) including coronary artery, mitral annular, aortic valve leaflet calcifications. Electronically Signed   By: Morgane  Naveau M.D.    On: 02/05/2024 18:48   DG Chest Portable 1 View Result Date: 02/05/2024 CLINICAL DATA:  Provided history: fall, abd pain, neuroogic deficits  EXAM: PORTABLE CHEST 1 VIEW COMPARISON:  Chest radiograph 08/05/2023. FINDINGS: Left-sided pacemaker in place. Chronic cardiomegaly, unchanged. Stable mediastinal contours. Aortic atherosclerosis. Left pleural effusion and basilar opacity. No pneumothorax. The lungs are hyperinflated with chronic bronchial thickening. On limited assessment, no evidence of displaced fracture. The bones are diffusely under mineralized. IMPRESSION: 1. Left pleural effusion and basilar opacity. 2. Chronic cardiomegaly. 3. Chronic bronchial thickening. Electronically Signed   By: Andrea Gasman M.D.   On: 02/05/2024 15:49     Assessment/Plan 89 yo F with concern for SBO, recent fall, and concern for stroke symptoms  LOS: 1 day   - Gastrografin  ordered, patient has yet to drink. Once GG administered needs 8h delayed KUB. All this has been ordered last night. - Please keep NPO until results of protocol  I reviewed hospitalist notes, last 24 h vitals and pain scores, last 48 h intake and output, last 24 h labs and trends, and last 24 h imaging results.  This care required moderate level of medical decision making.    Richerd Silversmith, MD [facility] 02/06/2024, 10:09 AM Please see Amion for pager number during day hours 7:00am-4:30pm

## 2024-02-06 NOTE — Progress Notes (Signed)
 Progress Note   Patient: Megan Olsen FMW:[id number] DOB: [birth year] DOA: 02/05/2024     1 DOS: the patient was seen and examined on 02/06/2024   Brief hospital course: 89 y.o. female with hx CVA, CAD/non-STEMI, A-fib on Eliquis , SSS/PPM, HFpEF, valvular disease, HTN, HLD, hypothyroidism, chronic IDA, B12 deficiency, chronic biliary dilation, anxiety and depression, right THA. L hip ORIF, recent fall admitted from 5/31 - 6/2, who is brought to ED from [facility] after recurrent GLF.  Incidental finding of early / partial SBO, and possible stroke-like symptoms of unclear chronicity.    Assessment and Plan:  Ground level fall, recurrent  Hx recent orthostatic hypotension  -Details are unclear, suspect mechanical fall. -Previously noted to have orthostasis and BP meds were stopped last admission. - Past eval with orthostasis TTE with significant change from prior in 2020. - TSH and cortisol normal. - CT angio head neck without acute process, A/P without traumatic injuries.  - Check orthostatics  - Pacemaker interrogation nursing communication order placed - PT/OT evaluation   Early/partial small bowel obstruction - General surgery, Dr. Paola, has ordered for SBO protocol imaging, keep NPO.  - Give 1 L LR and start on 75 cc an hour while NPO - Hold DOAC although doubt patient wants to proceed with surgery intervention given goals - Symptomatic management Tylenol  as needed mild, oxycodone  2.5/5 mg for moderate/severe, Dilaudid  0.5 mg IV every 4 hours as needed for breakthrough.  -Zofran  prn nausea.    R sided hypoesthesia ? Chronicity  Per EMS felt to have left facial droop, on my exam, ? R lower facial weakness. + R hypoesthesia. Unknown onset, pt without subjective complaints.  CTA head and neck no acute intracranial process, no LVO or hemodynamically significant stenosis. -MRI brain pending  GOC  MOST form says DNR/Comfort. She is not currently on hospice care but would be a  candidate.  Appears to evaluate time out of the hospital.  However currently okay with admission, diagnostic testing and monitoring.  - TOC/palliative consult: Evaluated, follow recommendations   Chronic medical problems: Hx CVA: suspect deficits above may be chronic from this. On DOAC for hx Afib currently held. Not on cholesterol lowering agents.  CAD / hx NSTEMI: see CVA.  Paroxysmal Afib: Currently Eliquis  held in case requires operative intervention. Continue home Metoprolol  BID  SSS/PPM: Noted,  Chronic HFpEF/severe mitral and tricuspid valve regurgitation/moderate aortic valve stenosis: Holding lasix .  Hypertension: Continue pretenses have been stopped with her recent orthostatic hypotension. Hyperlipidemia: Not on cholesterol-lowering agents Hypothyroidism: Not on thyroid  replacement, TSH within normal limit. IDA, B12 deficiency: Hemoglobin up from baseline likely hemoconcentrated. Chronic biliary dilation: Stable on imaging, isolated elevated Alk phos with other LFT wnl  Recurrent ground-level falls, T11/L1 endplate compression fractures, osteoporosis: Stable appearance on CT.  Mood d/o: Not on medication, ativan  trazodone  discontinued last admit  Cognitive impairment: A+O except to time.  Continue home memantine      There is no height or weight on file to calculate BMI.     DVT prophylaxis:  SCDs Code Status:  DNR/DNI(Do NOT Intubate); confirmed with patient      Subjective: She denies any fever nausea vomiting.  Physical Exam: Vitals:   02/05/24 2234 02/05/24 2343 02/06/24 0359 02/06/24 1113  BP: (!) 169/88 (!) 169/88 (!) 160/76 (!) 126/55  Pulse: 87 87 67   Resp: 18 16    Temp: (!) 97.5 F (36.4 C) (!) 97.5 F (36.4 C) 98.1 F (36.7 C)   TempSrc:  Oral  Oral   SpO2: 100%  96%   Weight:  46.6 kg    Height:  5' 5 (1.651 m)     Constitutional: Alert, awake, calm, comfortable HEENT: Neck supple Respiratory: clear to auscultation bilaterally, no wheezing, no  crackles. Normal respiratory effort. No accessory muscle use.  Cardiovascular: Regular rate and rhythm, no murmurs / rubs / gallops. No extremity edema. 2+ pedal pulses. No carotid bruits.  Abdomen: no tenderness, no masses palpated. No hepatosplenomegaly. Bowel sounds positive.  Musculoskeletal: no clubbing / cyanosis. No joint deformity upper and lower extremities. Good ROM, no contractures. Normal muscle tone.  Skin: no rashes, lesions, ulcers. No induration Neurologic: CN 2-12 grossly intact. Sensation intact, DTR normal. Strength 5/5 x all 4 extremities.  Psychiatric: Normal judgment and insight. Alert and oriented x 3. Normal mood.   Data Reviewed:  Sodium 135, potassium 3.8, WBC 6.5  Family Communication: None  Disposition: Status is: Inpatient Remains inpatient appropriate because: Ongoing recovery from possible SBO  Planned Discharge Destination: [facility]NF    Time spent: 35 minutes  Author: Nena Rebel, MD 02/06/2024 3:04 PM  For on call review [email or web address].

## 2024-02-06 NOTE — Hospital Course (Addendum)
 89 y.o. female with hx CVA, CAD/non-STEMI, A-fib on Eliquis , SSS/PPM, HFpEF, valvular disease, HTN, HLD, hypothyroidism, chronic IDA, B12 deficiency, chronic biliary dilation, anxiety and depression, right THA. L hip ORIF, recent fall admitted from 5/31 - 6/2, who is brought to ED from [facility] after recurrent GLF.  Incidental finding of early / partial SBO, and possible stroke-like symptoms of unclear chronicity. While patient was not able to eat and was on active treatment for small bowel obstruction, patient developed atrial fibrillation and rapid ventricular response required transferred to progressive care unit.  Family elected to make her comfort measures only.  Patient was given hospice and comfort measures only.    She passed away at 1330 on February 25, 2024.

## 2024-02-06 NOTE — Consult Note (Signed)
 Palliative Medicine Inpatient Consult Note  Consulting Provider:  Reason for consult:   Palliative Care Consult Services Palliative Medicine Consult  Reason for Consult? GOC / possible transition to hospice OP. OK to see 7/20 during day.   02/06/2024  HPI:  Per intake H&P --> Megan Olsen is a 89 y.o. female with hx CVA, CAD/non-STEMI, A-fib on Eliquis , SSS/PPM, HFpEF, valvular disease, HTN, HLD, hypothyroidism, chronic IDA, B12 deficiency, chronic biliary dilation, anxiety and depression, right THA. L hip ORIF, recent fall admitted from 5/31 - 6/2, who is brought to ED from [facility] after recurrent GLF.  Incidental finding of early / partial SBO, and possible stroke-like symptoms of unclear chronicity.  Palliative care requested to support additional goals of care conversations.   Clinical Assessment/Goals of Care:  *Please note that this is a verbal dictation therefore any spelling or grammatical errors are due to the Dragon Medical One system interpretation.  I have reviewed medical records including EPIC notes, labs and imaging, received report from bedside RN, assessed the patient who is lying in bed in NAD.    I met with Megan Olsen to further discuss diagnosis prognosis, GOC, EOL wishes, disposition and options.   I introduced Palliative Medicine as specialized medical care for people living with serious illness. It focuses on providing relief from the symptoms and stress of a serious illness. The goal is to improve quality of life for both the patient and the family.  Medical History Review and Understanding:  A review of Megan Olsen's past medical history inclusive of coronary artery disease, atrial fibrillation, sick sinus syndrome requiring pacemaker placement, heart failure, hypertension, hypothyroidism, vitamin B12 deficiency, and previous stroke was completed.  Social History:  Megan Olsen is originally from [place] .  She is spent the majority of her life in  [place] .  She has never been married nor has she had any children.  She shares she does have a niece who lives locally, Megan Olsen with whom she is close to.  She formally worked at [employer] for over 30 years working further [employer] whereby she was contracted with [place] states.  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  Angelise gets joy out of going out with her friends to eat.  She is generally someone who likes to socialize.  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  Functional and Nutritional State:  Preceding hospitalization Megan Olsen had been living at [facility].  She does have a shared room with another resident called a twin room.  She shares she had been able to do things on her own and does use a walker for mobility.  Regarding falls she says she has had multiple falls over the past few months.  From a nutritional perspective Megan Olsen does maintain a fairly decent appetite.  Palliative Symptoms:  Constipation-last bowel movement was multiple days ago.  Megan Olsen does feel like she has the urge to go and is open to additional stimulating medications should they be warranted.  Advance Directives:  A detailed discussion was had today regarding advanced directives.  While Megan Olsen is able to make decisions for herself she does share if she should for any reason become incapacitated she would like her niece Megan Olsen to be her surrogate decision maker.  She does have documentation of this in the electronic medical record.  Code Status:  Concepts specific to code status, artifical feeding and hydration, continued IV antibiotics and  rehospitalization was had.  The difference between a aggressive medical intervention path  and a palliative comfort care path for this patient at this time was had.   Megan Olsen is an established DO NOT RESUSCITATE DO NOT INTUBATE CODE STATUS.  This was confirmed in  conversation this morning.  Discussion:  We discussed patients chronic disease burden. We reviewed the progressive nature of heart failure according to NYHA classification system. We reviewed Brownie's.  Megan Olsen shares understanding that she came into the hospital in the setting of a fall.  She notes this has happened previously.  We reviewed in addition to this she is also having constipation and has been identified on imaging to have the early signs of a small bowel obstruction.  We reviewed what Megan Olsen would desire moving forward in terms of treatment.  She is very clear that she would not want surgical intervention.  She would not want to maintain on supportive measures such as artificial nutrition.  If for any reason it is identified that that he is at the end of her life she will want to be made comfortable.  We discussed that Megan Olsen is lived a full life and is very much at peace with her end-of-life when that date and time comes.  We reviewed the possibility of hospice care at [facility] should this be her desire. I described hospice as a service for patients who have a life expectancy of 6 months or less. The goal of hospice is the preservation of dignity and quality at the end phases of life. Under hospice care, the focus changes from curative to symptom relief.   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  She would like to learn more about services and is amenable to meeting with one of the hospice representatives to discuss options moving forward from the perspective of palliative and hospice care.  Discussed the importance of continued conversation with family and their  medical providers regarding overall plan of care and treatment options, ensuring decisions are within the context of the patients values and GOCs.  Decision Maker: Megan Olsen (Niece): [phone] (Mobile)   SUMMARY OF RECOMMENDATIONS    DNAR/DNI  Open and honest conversations held in the setting of patients chronic disease burden  Patient is open to meeting with hospice to learn about services - TOC has coordinated a meeting with [facility]  Ongoing PMT support  Code Status/Advance Care Planning: DNAR/DNI  Palliative Prophylaxis:  Aspiration, Bowel Regimen, Delirium Protocol, Frequent Pain Assessment, Oral Care, Palliative Wound Care, and Turn Reposition  Additional Recommendations (Limitations, Scope, Preferences): Continue comfort care  Psycho-social/Spiritual:  Desire for further Chaplaincy support: Yes Additional Recommendations: Education on chronic disease burden   Prognosis: Limited overall  Discharge Planning: Discharge to be determined.   Vitals:   02/05/24 2343 02/06/24 0359  BP: (!) 169/88 (!) 160/76  Pulse: 87 67  Resp: 16   Temp: (!) 97.5 F (36.4 C) 98.1 F (36.7 C)  SpO2:  96%    Intake/Output Summary (Last 24 hours) at 02/06/2024 0657 Last data filed at 02/06/2024 0430 Gross per 24 hour  Intake 100 ml  Output 650 ml  Net -550 ml   Last Weight  Most recent update: 02/05/2024 11:45 PM    Weight  46.6 kg (102 lb 11.8 oz)            Gen:  Elderly Caucasian F chronically ill appearing HEENT: moist mucous membranes CV: Regular rate and irregular  rhythm  PULM:  Breathing on RA, even and nonlabored ABD: soft/nontender  EXT: No edema  Neuro: Alert and oriented x3   PPS: 40-50%   This conversation/these recommendations were discussed with patient primary care team, Dr. Roann ______________________________________________________ Rosaline [facility] Palliative Medicine Team Team Cell Phone: [phone] Please utilize secure chat with additional questions, if there is no response within 30 minutes please call the above phone number  Total Time: 75 Billing based on MDM: High  Palliative Medicine Team providers are available by phone from 7am to 7pm daily and can  be reached through the team cell phone.  Should this patient require assistance outside of these hours, please call the patient's attending physician.

## 2024-02-06 NOTE — Progress Notes (Signed)
 Megan Olsen 2W03 [facility] Liaison Note   Received request from [place], Transitions of Care Manager, for hospice services at [facility] after discharge. Spoke with patient to initiate education related to hospice philosophy, services, and team approach to care. Also provided this information to niece [name] per patient request. Patient verbalized understanding of information given. Per discussion, the plan is for discharge to facility when medically stable.   DME needs discussed. Patient has the following equipment in the home: walker  Patient requests the following equipment for delivery: no needs per patient     Please send signed and completed DNR home with patient/family. Please provide prescriptions at discharge as needed to ensure ongoing symptom management.   [facility] information and contact numbers given to patient.  Above information shared with [facility] and medical team.   Please call with any questions or concerns.   Thank you for the opportunity to participate in this patient's care.   Nat Babe, BSN, [employer] [phone]

## 2024-02-06 NOTE — Plan of Care (Addendum)
 History of paroxysmal atrial fibrillation RN reported that telemetry called that patient heart rate was 160-180s range which has been improved to upper 140s range.  Patient has history of paroxysmal atrial fibrillation currently on Lopressor  12.5 mg twice daily.  Already received nighttime dose.  Hemodynamically stable.  Giving IV Lopressor  stat 5 mg and obtaining EKG. -Pacemaker interrogation has been ordered by daytime physician but I am not sure who and how it is going to be done. -Please consult cardiology in the daytime to interrogate the pacemaker. -Also in the setting of ground-level fall and right-sided chronic hypoesthesia MRI brain has been ordered which has not been completed yet because the MRI tech need to figure out if the pacemaker is MRI compatible/not. -Eliquis  on hold in the setting of small bowel obstruction and in case patient will undergo for any surgical intervention as well as pending MRI. -Given patient has a small bowel obstruction and currently has poor oral absorption with oral medications changing oral metoprolol  12 mg twice daily to IV metoprolol  5 mg every 6 hours scheduled as long as patient is n.p.o. in the setting of SBO.   Kohan Azizi, MD [facility] 02/06/2024, 10:48 PM

## 2024-02-07 ENCOUNTER — Inpatient Hospital Stay (HOSPITAL_COMMUNITY)

## 2024-02-07 DIAGNOSIS — Z7189 Other specified counseling: Secondary | ICD-10-CM | POA: Diagnosis not present

## 2024-02-07 DIAGNOSIS — Z515 Encounter for palliative care: Secondary | ICD-10-CM | POA: Diagnosis not present

## 2024-02-07 LAB — CBC
HCT: 41.4 % (ref 36.0–46.0)
Hemoglobin: 13.4 g/dL (ref 12.0–15.0)
MCH: 32.4 pg (ref 26.0–34.0)
MCHC: 32.4 g/dL (ref 30.0–36.0)
MCV: 100.2 fL — ABNORMAL HIGH (ref 80.0–100.0)
Platelets: 290 K/uL (ref 150–400)
RBC: 4.13 MIL/uL (ref 3.87–5.11)
RDW: 15.5 % (ref 11.5–15.5)
WBC: 1.6 K/uL — ABNORMAL LOW (ref 4.0–10.5)
nRBC: 0 % (ref 0.0–0.2)

## 2024-02-07 LAB — COMPREHENSIVE METABOLIC PANEL WITH GFR
ALT: 16 U/L (ref 0–44)
AST: 29 U/L (ref 15–41)
Albumin: 2.2 g/dL — ABNORMAL LOW (ref 3.5–5.0)
Alkaline Phosphatase: 133 U/L — ABNORMAL HIGH (ref 38–126)
Anion gap: 19 — ABNORMAL HIGH (ref 5–15)
BUN: 22 mg/dL (ref 8–23)
CO2: 18 mmol/L — ABNORMAL LOW (ref 22–32)
Calcium: 8.3 mg/dL — ABNORMAL LOW (ref 8.9–10.3)
Chloride: 101 mmol/L (ref 98–111)
Creatinine, Ser: 0.94 mg/dL (ref 0.44–1.00)
GFR, Estimated: 56 mL/min — ABNORMAL LOW (ref >60)
Glucose, Bld: 77 mg/dL (ref 70–99)
Potassium: 3.5 mmol/L (ref 3.5–5.1)
Sodium: 138 mmol/L (ref 135–145)
Total Bilirubin: 1 mg/dL (ref 0.0–1.2)
Total Protein: 5.2 g/dL — ABNORMAL LOW (ref 6.5–8.1)

## 2024-02-07 LAB — TSH: TSH: 10.445 u[IU]/mL — ABNORMAL HIGH (ref 0.350–4.500)

## 2024-02-07 LAB — T4, FREE: Free T4: 1.34 ng/dL — ABNORMAL HIGH (ref 0.61–1.12)

## 2024-02-07 MED ORDER — HYDROMORPHONE HCL-NACL 50-0.9 MG/50ML-% IV SOLN: 1.0000 mg/h | INTRAVENOUS | Status: DC

## 2024-02-07 MED ORDER — METOPROLOL TARTRATE 5 MG/5ML IV SOLN
5.0000 mg | INTRAVENOUS | Status: AC
  Administered 2024-02-07: 5 mg via INTRAVENOUS

## 2024-02-07 MED ORDER — ONDANSETRON 4 MG PO TBDP: 4.0000 mg | ORAL_TABLET | Freq: Four times a day (QID) | ORAL | Status: DC | PRN

## 2024-02-07 MED ORDER — GLYCOPYRROLATE 1 MG PO TABS: 1.0000 mg | ORAL_TABLET | ORAL | Status: DC | PRN

## 2024-02-07 MED ORDER — GLYCOPYRROLATE 0.2 MG/ML IJ SOLN: 0.2000 mg | INTRAMUSCULAR | Status: DC | PRN

## 2024-02-07 MED ORDER — HYDROMORPHONE HCL 1 MG/ML IJ SOLN: 0.5000 mg | INTRAMUSCULAR | Status: DC

## 2024-02-07 MED ORDER — BIOTENE DRY MOUTH MT LIQD: 15.0000 mL | OROMUCOSAL | Status: DC | PRN

## 2024-02-07 MED ORDER — DILTIAZEM HCL-DEXTROSE 125-5 MG/125ML-% IV SOLN (PREMIX)
5.0000 mg/h | INTRAVENOUS | Status: DC
  Administered 2024-02-07: 5 mg/h via INTRAVENOUS
  Filled 2024-02-07 (×2): qty 125

## 2024-02-07 MED ORDER — GLYCOPYRROLATE 0.2 MG/ML IJ SOLN
0.4000 mg | INTRAMUSCULAR | Status: DC
  Filled 2024-02-07 (×3): qty 2

## 2024-02-07 MED ORDER — DIAZEPAM 5 MG/ML IJ SOLN: 2.5000 mg | INTRAMUSCULAR | Status: DC | PRN

## 2024-02-07 MED ORDER — ONDANSETRON HCL 4 MG/2ML IJ SOLN: 4.0000 mg | Freq: Four times a day (QID) | INTRAMUSCULAR | Status: DC | PRN

## 2024-02-07 MED ORDER — POTASSIUM CHLORIDE 10 MEQ/100ML IV SOLN
10.0000 meq | INTRAVENOUS | Status: AC
  Administered 2024-02-07 (×2): 10 meq via INTRAVENOUS
  Filled 2024-02-07 (×2): qty 100

## 2024-02-07 MED ORDER — HYDROMORPHONE HCL 1 MG/ML IJ SOLN: 0.5000 mg | INTRAMUSCULAR | Status: DC | PRN

## 2024-02-07 MED ORDER — DILTIAZEM LOAD VIA INFUSION: 10.0000 mg | INTRAVENOUS | Status: DC

## 2024-02-07 MED ORDER — POLYVINYL ALCOHOL 1.4 % OP SOLN: 1.0000 [drp] | Freq: Four times a day (QID) | OPHTHALMIC | Status: DC | PRN

## 2024-02-07 MED ORDER — METOPROLOL TARTRATE 5 MG/5ML IV SOLN
5.0000 mg | INTRAVENOUS | Status: DC
  Filled 2024-02-07: qty 5

## 2024-02-07 MED ORDER — METOPROLOL TARTRATE 5 MG/5ML IV SOLN: 2.5000 mg | INTRAVENOUS | Status: DC

## 2024-02-07 MED ORDER — ONDANSETRON HCL 4 MG/2ML IJ SOLN: 4.0000 mg | Freq: Four times a day (QID) | INTRAMUSCULAR | Status: DC

## 2024-02-07 MED ORDER — LACTATED RINGERS IV BOLUS
1000.0000 mL | Freq: Once | INTRAVENOUS | Status: AC
  Administered 2024-02-07: 1000 mL via INTRAVENOUS

## 2024-02-07 MED ORDER — PROMETHAZINE (PHENERGAN) 6.25MG IN NS 50ML IVPB
6.2500 mg | Freq: Four times a day (QID) | INTRAVENOUS | Status: DC | PRN
  Filled 2024-02-07: qty 50

## 2024-02-07 MED ORDER — HYDROMORPHONE BOLUS VIA INFUSION: 1.0000 mg | INTRAVENOUS | Status: DC | PRN

## 2024-02-10 DIAGNOSIS — I5032 Chronic diastolic (congestive) heart failure: Secondary | ICD-10-CM | POA: Diagnosis not present

## 2024-02-10 DIAGNOSIS — I10 Essential (primary) hypertension: Secondary | ICD-10-CM | POA: Diagnosis not present

## 2024-02-18 NOTE — Progress Notes (Signed)
 OT Cancellation and DC Note  Patient Details Name: Megan Olsen MRN: [id number] DOB: 26-May-1928   Cancelled Treatment:    Reason Eval/Treat Not Completed: Other (comment) (Pt transition to comfort care, OT Signing off.)  03-04-24  AB, OTR/L  Acute Rehabilitation Services  Office: [phone]   Megan Olsen 2024/03/04, 1:21 PM

## 2024-02-18 NOTE — Discharge Summary (Signed)
 Physician Discharge Summary   Patient: Megan Olsen MRN: [id number] DOB: 06/22/1928  Admit date:     02/05/2024  Discharge date: 21-Feb-2024  Discharge Physician: Nena Rebel   PCP: Ozell Heron HERO, MD    Death Summary   Disposition: Patient passed away  Discharge Diagnoses: Principal Problem:   Ground-level fall Active Problems:   Goals of care, counseling/discussion   SBO (small bowel obstruction) (HCC)   Stroke-like symptoms  Resolved Problems:   * No resolved hospital problems. *  Hospital Course: 89 y.o. female with hx CVA, CAD/non-STEMI, A-fib on Eliquis , SSS/PPM, HFpEF, valvular disease, HTN, HLD, hypothyroidism, chronic IDA, B12 deficiency, chronic biliary dilation, anxiety and depression, right THA. L hip ORIF, recent fall admitted from 5/31 - 6/2, who is brought to ED from [facility] after recurrent GLF.  Incidental finding of early / partial SBO, and possible stroke-like symptoms of unclear chronicity. While patient was not able to eat and was on active treatment for small bowel obstruction, patient developed atrial fibrillation and rapid ventricular response required transferred to progressive care unit.  Family elected to make her comfort measures only.  Patient was given hospice and comfort measures only.    She passed away at 1330 on 02/21/2024.         Consultants: Palliative care/hospice Procedures performed: None Disposition: Passed away   DISCHARGE MEDICATION:   Discharge Exam: Filed Weights   02/05/24 2343 Feb 21, 2024 0220  Weight: 46.6 kg 43 kg     Condition at discharge: Passed away  The results of significant diagnostics from this hospitalization (including imaging, microbiology, ancillary and laboratory) are listed below for reference.   Imaging Studies: DG Abd Portable 1V Result Date: 02/21/24 CLINICAL DATA:  89 year old female with abdominal pain, suspected small-bowel obstruction on CT Abdomen and Pelvis 02/05/2024. EXAM:  PORTABLE ABDOMEN - 1 VIEW COMPARISON:  Abdominal radiographs yesterday, recent CT Abdomen and Pelvis. FINDINGS: Portable AP supine view at 0819 hours. Chronic postoperative changes to both hips. Stable visualized osseous structures. Residual excreted IV contrast in the urinary bladder. Since 02/05/2024 bowel gas pattern has not significantly changed. However, administered oral contrast has reached the ascending colon and appears to have mostly cleared the stomach (the remains some vague contrast along with similar gastric distension. No distal large bowel contrast. Stable cholecystectomy clips. Partially visible cardiac pacemaker lead. IMPRESSION: Contrast not definitely progressed past the ascending colon, and bowel-gas pattern not significantly changed over this series of exams. Consider ongoing partial small bowel obstruction or ileus. Electronically Signed   By: VEAR Hurst M.D.   On: 02/21/24 08:42   DG Abd Portable 1V-Small Bowel Obstruction Protocol-initial, 8 hr delay Result Date: 02/06/2024 CLINICAL DATA:  Small-bowel obstruction. EXAM: PORTABLE ABDOMEN - 1 VIEW COMPARISON:  CT, 02/05/2024. FINDINGS: Stomach is distended with contrast pooling in the gastric fundus. Dilute contrast is noted in the small bowel, most evident in the pelvis, and in the right abdomen where it appears to be in the colon. There is persistent small-bowel dilation, but this is not well-defined. IMPRESSION: 1. Dilute contrast noted in the small bowel and right colon. There is less apparent small-bowel dilation than on the prior CT. Findings suggest an improved partial small bowel obstruction. Electronically Signed   By: Alm Parkins M.D.   On: 02/06/2024 19:30   CT ANGIO HEAD NECK W WO CM Result Date: 02/05/2024 EXAM: CTA HEAD AND NECK WITH AND WITHOUT 02/05/2024 05:33:34 PM TECHNIQUE: CTA of the head and neck was performed with and without  the administration of intravenous contrast. Multiplanar 2D and/or 3D reformatted images  are provided for review. Automated exposure control, iterative reconstruction, and/or weight based adjustment of the mA/kV was utilized to reduce the radiation dose to as low as reasonably achievable. Stenosis of the internal carotid arteries measured using NASCET criteria. COMPARISON: 12/18/2023, 01/30/19 CLINICAL HISTORY: Neuro deficit, acute, stroke suspected; Right sided numbness in right face, right arm, and right leg. Unsure if new. History of stroke. Fall today. Chief complaints; Dizziness; Fall; CT ANGIO HEAD NECK W WO CM; Neuro deficit, acute, stroke suspected, Right sided numbness in right face, right arm, and right .; CT ABDOMEN PELVIS W CONTRAST; Abdominal pain, acute, nonlocalized, Acute right lower quadrant abdominal tenderness. FINDINGS: CTA NECK: AORTIC ARCH AND ARCH VESSELS: No dissection or arterial injury. No significant stenosis of the brachiocephalic or subclavian arteries. Calcific aortic atherosclerosis. CERVICAL CAROTID ARTERIES: No dissection, arterial injury, or hemodynamically significant stenosis by NASCET criteria. Mild atherosclerosis at both carotid bifurcations without hemodynamically significant stenosis. Calcific atherosclerosis of both internal carotid arteries at the skull base without hemodynamically significant stenosis. CERVICAL VERTEBRAL ARTERIES: No dissection, arterial injury, or significant stenosis. Mild atherosclerosis of the left vertebral artery V4 segment but otherwise normal vertebral arteries. LUNGS AND MEDIASTINUM: 5 mm ground-glass density nodule in the right lung apex. Small left pleural effusion. SOFT TISSUES: No acute abnormality. BONES: No acute abnormality. CTA HEAD: ANTERIOR CIRCULATION: No significant stenosis of the internal carotid arteries. No significant stenosis of the anterior cerebral arteries. No significant stenosis of the middle cerebral arteries. No aneurysm. Fetal origin of the left PCA. POSTERIOR CIRCULATION: No significant stenosis of the  posterior cerebral arteries. No significant stenosis of the basilar artery. No significant stenosis of the vertebral arteries. No aneurysm. OTHER: No dural venous sinus thrombosis on this non-dedicated study. IMPRESSION: 1. No large vessel occlusion, hemodynamically significant stenosis, or aneurysm in the head or neck. 2. Mild atherosclerosis at both carotid bifurcations and left vertebral artery V4 segment without hemodynamically significant stenosis. Electronically signed by: Franky Stanford MD 02/05/2024 08:06 PM EDT RP Workstation: [id number]   CT ABDOMEN PELVIS W CONTRAST Result Date: 02/05/2024 CLINICAL DATA:  Abdominal pain, acute, nonlocalized Acute right lower quadrant abdominal tenderness. EXAM: CT ABDOMEN AND PELVIS WITH CONTRAST TECHNIQUE: Multidetector CT imaging of the abdomen and pelvis was performed using the standard protocol following bolus administration of intravenous contrast. RADIATION DOSE REDUCTION: This exam was performed according to the departmental dose-optimization program which includes automated exposure control, adjustment of the mA and/or kV according to patient size and/or use of iterative reconstruction technique. CONTRAST:  75mL OMNIPAQUE  IOHEXOL  350 MG/ML SOLN COMPARISON:  CT abdomen pelvis 12/18/2023 FINDINGS: Lower chest: Small left and trace right pleural effusions. Left lower lobe atelectasis. Cardiomegaly. Mitral annular calcification. Aortic valve leaflet calcification. Coronary artery calcification. Cardiac leads partially visualized. Hepatobiliary: No focal liver abnormality. Status post cholecystectomy. Persistent central intrahepatic biliary dilatation and common bile duct dilatation measuring up to 11 mm. Pancreas: Diffusely atrophic. No focal lesion. Otherwise normal pancreatic contour. No surrounding inflammatory changes. No main pancreatic ductal dilatation. Spleen: Normal in size without focal abnormality. Adrenals/Urinary Tract: No adrenal nodule bilaterally.  Bilateral kidneys enhance symmetrically. Subcentimeter hypodensities are too small to characterize-no further follow-up indicated. No hydronephrosis. No hydroureter. The urinary bladder is unremarkable. Stomach/Bowel: Stomach is within normal limits. No evidence of large bowel wall thickening or dilatation. Fluid dilatation of several loops of proximal jejunum measuring up to 4.2 cm in caliber with possible developing transition point noted within the  right anterior mid abdomen (3:39). Associated luminal air-fluid level. Colonic diverticulosis. Appendix appears normal. Vascular/Lymphatic: CT No abdominal aorta or iliac aneurysm. Moderate atherosclerotic plaque of the aorta and its branches. No abdominal, pelvic, or inguinal lymphadenopathy. Reproductive: Not well visualized due to streak artifact originating from the right femoral hardware. Likely hysterectomy. Other: Nonspecific trace pelvic intraperitoneal free fluid. No intraperitoneal free gas. No organized fluid collection. Musculoskeletal: No abdominal wall hernia or abnormality. No suspicious lytic or blastic osseous lesions. No acute displaced fracture. Chronic similar-appearing L1 compression fracture. Multilevel degenerative changes of the spine. Total right hip arthroplasty. Nail fixation of the left femoral neck. IMPRESSION: 1. Developing/early or partial small-bowel obstruction of the proximal jejunum with likely developing transition point within the right anterior mid abdomen. 2. Small left and trace right pleural effusions. 3. Cholecystectomy with chronic persistent intra and extrahepatic biliary ductal dilatation. 4. Cardiomegaly. 5. Aortic Atherosclerosis (ICD10-I70.0) including coronary artery, mitral annular, aortic valve leaflet calcifications. Electronically Signed   By: Morgane  Naveau M.D.   On: 02/05/2024 18:48   DG Chest Portable 1 View Result Date: 02/05/2024 CLINICAL DATA:  Provided history: fall, abd pain, neuroogic deficits EXAM:  PORTABLE CHEST 1 VIEW COMPARISON:  Chest radiograph 08/05/2023. FINDINGS: Left-sided pacemaker in place. Chronic cardiomegaly, unchanged. Stable mediastinal contours. Aortic atherosclerosis. Left pleural effusion and basilar opacity. No pneumothorax. The lungs are hyperinflated with chronic bronchial thickening. On limited assessment, no evidence of displaced fracture. The bones are diffusely under mineralized. IMPRESSION: 1. Left pleural effusion and basilar opacity. 2. Chronic cardiomegaly. 3. Chronic bronchial thickening. Electronically Signed   By: Andrea Gasman M.D.   On: 02/05/2024 15:49    Microbiology: Results for orders placed or performed during the hospital encounter of 08/05/23  Resp panel by RT-PCR (RSV, Flu A&B, Covid) Anterior Nasal Swab     Status: Abnormal   Collection Time: 08/05/23  1:39 PM   Specimen: Anterior Nasal Swab  Result Value Ref Range Status   SARS Coronavirus 2 by RT PCR NEGATIVE NEGATIVE Final    Comment: (NOTE) SARS-CoV-2 target nucleic acids are NOT DETECTED.  The SARS-CoV-2 RNA is generally detectable in upper respiratory specimens during the acute phase of infection. The lowest concentration of SARS-CoV-2 viral copies this assay can detect is 138 copies/mL. A negative result does not preclude SARS-Cov-2 infection and should not be used as the sole basis for treatment or other patient management decisions. A negative result may occur with  improper specimen collection/handling, submission of specimen other than nasopharyngeal swab, presence of viral mutation(s) within the areas targeted by this assay, and inadequate number of viral copies(<138 copies/mL). A negative result must be combined with clinical observations, patient history, and epidemiological information. The expected result is Negative.  Fact Sheet for Patients:  BloggerCourse.com  Fact Sheet for Healthcare Providers:   SeriousBroker.it  This test is no t yet approved or cleared by the [place]  FDA and  has been authorized for detection and/or diagnosis of SARS-CoV-2 by FDA under an Emergency Use Authorization (EUA). This EUA will remain  in effect (meaning this test can be used) for the duration of the COVID-19 declaration under Section 564(b)(1) of the Act, 21 U.S.C.section 360bbb-3(b)(1), unless the authorization is terminated  or revoked sooner.       Influenza A by PCR NEGATIVE NEGATIVE Final   Influenza B by PCR NEGATIVE NEGATIVE Final    Comment: (NOTE) The Xpert Xpress SARS-CoV-2/FLU/RSV plus assay is intended as an aid in the diagnosis of influenza from Nasopharyngeal  swab specimens and should not be used as a sole basis for treatment. Nasal washings and aspirates are unacceptable for Xpert Xpress SARS-CoV-2/FLU/RSV testing.  Fact Sheet for Patients: BloggerCourse.com  Fact Sheet for Healthcare Providers: SeriousBroker.it  This test is not yet approved or cleared by the [place]  FDA and has been authorized for detection and/or diagnosis of SARS-CoV-2 by FDA under an Emergency Use Authorization (EUA). This EUA will remain in effect (meaning this test can be used) for the duration of the COVID-19 declaration under Section 564(b)(1) of the Act, 21 U.S.C. section 360bbb-3(b)(1), unless the authorization is terminated or revoked.     Resp Syncytial Virus by PCR POSITIVE (A) NEGATIVE Final    Comment: (NOTE) Fact Sheet for Patients: BloggerCourse.com  Fact Sheet for Healthcare Providers: SeriousBroker.it  This test is not yet approved or cleared by the [place]  FDA and has been authorized for detection and/or diagnosis of SARS-CoV-2 by FDA under an Emergency Use Authorization (EUA). This EUA will remain in effect (meaning this test can be used)  for the duration of the COVID-19 declaration under Section 564(b)(1) of the Act, 21 U.S.C. section 360bbb-3(b)(1), unless the authorization is terminated or revoked.  Performed at [facility], [address], [place], [place] [address]     Labs: CBC: Recent Labs  Lab 02/05/24 1449 02/06/24 0217 February 28, 2024 0905  WBC 5.7 6.5 1.6*  NEUTROABS 4.6  --   --   HGB 10.8* 10.4* 13.4  HCT 32.9* 31.2* 41.4  MCV 100.6* 99.4 100.2*  PLT 180 193 290   Basic Metabolic Panel: Recent Labs  Lab 02/05/24 1449 02/06/24 0217 02/06/24 2306 02/28/2024 0438  NA 135 135 137 138  K 4.0 3.8 3.3* 3.5  CL 103 102 100 101  CO2 24 22 22  18*  GLUCOSE 103* 86 86 77  BUN 18 17 22 22   CREATININE 0.69 0.60 0.91 0.94  CALCIUM 8.0* 7.9* 8.2* 8.3*  MG 1.7 2.1 1.9  --   PHOS 3.5 4.0  --   --    Liver Function Tests: Recent Labs  Lab 02/05/24 1449 28-Feb-2024 0438  AST 26 29  ALT 14 16  ALKPHOS 141* 133*  BILITOT 0.7 1.0  PROT 5.0* 5.2*  ALBUMIN 2.3* 2.2*   CBG: No results for input(s): GLUCAP in the last 168 hours.  Discharge time spent: less than 30 minutes.  Signed: Nena Rebel, MD [facility] 02/28/2024

## 2024-02-18 NOTE — Progress Notes (Signed)
 Mobility Specialist Progress Note:    02-27-2024 0952  Mobility  Activity Dangled on edge of bed  Level of Assistance Minimal assist, patient does 75% or more  Assistive Device None  Activity Response Tolerated fair  Mobility visit 1 Mobility  Mobility Specialist Start Time (ACUTE ONLY) [id number]  Mobility Specialist Stop Time (ACUTE ONLY) 1002  Mobility Specialist Time Calculation (min) (ACUTE ONLY) 10 min   Pt requested assistance to ambulate to bathroom. MinA required to sit EOB. BP reported feeling dizzy, quietly lying head back down and resuming supine position. Recorded BP 86/64 (72). After lying down a few min, BP 96/56 (64). RN notified. Pt requested bed pan instead. Bed alarm on.   Judeth Gilles Mobility Specialist Please contact via Special educational needs teacher or  Rehab office at [phone]

## 2024-02-18 NOTE — Progress Notes (Signed)
 Patient expired @ 1330, next of kin notified, funeral arrangements have been made. Patient possessions are at the nursing desk bagged up for next of kin Idell) to pick up.

## 2024-02-18 NOTE — Progress Notes (Signed)
   2024-02-09 1349  Spiritual Encounters  Type of Visit Initial  Care provided to: Patient  Spiritual Framework  Patient Stress Factors None identified  Family Stress Factors None identified   Chaplain  respond to page, Pt passed away .

## 2024-02-18 NOTE — Progress Notes (Signed)
 VAST consult received to obtain IV access. Patient has been here for 2 days and has had 3 IVs. Her vasculature is poor with very small and fragile veins. Her right lower arm is bruised and swollen from fall and infiltrated IV. Left arm assessed with US ; superficial veins as well as deep veins very small. Pt with one appropriate sized brachial vein which could be used for PICC placement.  Able to obtain a 22G 1.75 inch USGIV in her lower proximal left arm for cardizem . If patient needs further access, PICC is recommended.

## 2024-02-18 NOTE — Progress Notes (Signed)
 Patient with persistent vomiting and elevated heart rate up to 190s range this shift. Pt drowsy/lethargic at this time but responsive to voice. MD made aware. LR bolus given. IV lopressor  given x 2, see MAR. Potassium infusion started. Pt transferred to 4E progressive care. Report given to nurse.

## 2024-02-18 NOTE — Progress Notes (Signed)
 PT Cancellation Note  Patient Details Name: JONNE ROTE MRN: [id number] DOB: 11/06/1927   Cancelled Treatment:    Reason Eval/Treat Not Completed: Medical issues which prohibited therapy (sign off as pt now comfort care.)   Stephane JULIANNA Bevel 2024/03/07, 1:48 PM Zaryia Markel M,PT Acute Rehab Services [phone]

## 2024-02-18 NOTE — Progress Notes (Signed)
 Subjective: Patient woke up from sleep.  C/o dizziness.  C/o nausea, vomiting this morning.  Some flatus, +BM yesterday.  Still has pain on the right side of her abdomen.  ROS: See above, otherwise other systems negative  Objective: Vital signs in last 24 hours: Temp:  [97.5 F (36.4 C)-98.4 F (36.9 C)] 97.7 F (36.5 C) 03-02-24 0757) Pulse Rate:  [64-115] 64 03-02-24 0757) Resp:  [18-23] 20 03-02-2024 0757) BP: (101-175)/(52-101) 101/52 2024/03/02 0757) SpO2:  [90 %-100 %] 96 % 2024/03/02 0757) Weight:  [43 kg] 43 kg 02-Mar-2024 0220) Last BM Date : 02/05/24  Intake/Output from previous day: 07/20 0701 - 03/02/2024 0700 In: 1368.9 [I.V.:238.5; IV Piggyback:1130.4] Out: -  Intake/Output this shift: No intake/output data recorded.  PE: Gen: laying in bed in NAD Heart: regular Lungs: respiratory effort nonlabored Abd: soft, distention noted, tender to palpation with some voluntary guarding particularly on the right side, RLQ, of her abdomen.    Lab Results:  Recent Labs    02/05/24 1449 02/06/24 0217  WBC 5.7 6.5  HGB 10.8* 10.4*  HCT 32.9* 31.2*  PLT 180 193   BMET Recent Labs    02/06/24 2306 2024/03/02 0438  NA 137 138  K 3.3* 3.5  CL 100 101  CO2 22 18*  GLUCOSE 86 77  BUN 22 22  CREATININE 0.91 0.94  CALCIUM 8.2* 8.3*   PT/INR Recent Labs    02/06/24 0217  LABPROT 15.3*  INR 1.1   CMP     Component Value Date/Time   NA 138 03/02/2024 0438   NA 140 11/07/2021 1520   K 3.5 03-02-24 0438   CL 101 03/02/24 0438   CO2 18 (L) 03/02/2024 0438   GLUCOSE 77 03/02/2024 0438   BUN 22 03/02/2024 0438   BUN 20 11/07/2021 1520   CREATININE 0.94 03/02/24 0438   CREATININE 0.78 05/31/2020 1558   CALCIUM 8.3 (L) 03-02-2024 0438   PROT 5.2 (L) 02-Mar-2024 0438   ALBUMIN 2.2 (L) 02-Mar-2024 0438   AST 29 03-02-24 0438   ALT 16 03/02/24 0438   ALKPHOS 133 (H) Mar 02, 2024 0438   BILITOT 1.0 Mar 02, 2024 0438   GFRNONAA 56 (L) 2024-03-02 0438   GFRAA 60 03/24/2019  1438   Lipase     Component Value Date/Time   LIPASE 26 02/05/2024 1449       Studies/Results: DG Abd Portable 1V-Small Bowel Obstruction Protocol-initial, 8 hr delay Result Date: 02/06/2024 CLINICAL DATA:  Small-bowel obstruction. EXAM: PORTABLE ABDOMEN - 1 VIEW COMPARISON:  CT, 02/05/2024. FINDINGS: Stomach is distended with contrast pooling in the gastric fundus. Dilute contrast is noted in the small bowel, most evident in the pelvis, and in the right abdomen where it appears to be in the colon. There is persistent small-bowel dilation, but this is not well-defined. IMPRESSION: 1. Dilute contrast noted in the small bowel and right colon. There is less apparent small-bowel dilation than on the prior CT. Findings suggest an improved partial small bowel obstruction. Electronically Signed   By: Alm Parkins M.D.   On: 02/06/2024 19:30   CT ANGIO HEAD NECK W WO CM Result Date: 02/05/2024 EXAM: CTA HEAD AND NECK WITH AND WITHOUT 02/05/2024 05:33:34 PM TECHNIQUE: CTA of the head and neck was performed with and without the administration of intravenous contrast. Multiplanar 2D and/or 3D reformatted images are provided for review. Automated exposure control, iterative reconstruction, and/or weight based adjustment of the mA/kV was utilized to reduce the radiation dose  to as low as reasonably achievable. Stenosis of the internal carotid arteries measured using NASCET criteria. COMPARISON: 12/18/2023, 01/30/19 CLINICAL HISTORY: Neuro deficit, acute, stroke suspected; Right sided numbness in right face, right arm, and right leg. Unsure if new. History of stroke. Fall today. Chief complaints; Dizziness; Fall; CT ANGIO HEAD NECK W WO CM; Neuro deficit, acute, stroke suspected, Right sided numbness in right face, right arm, and right .; CT ABDOMEN PELVIS W CONTRAST; Abdominal pain, acute, nonlocalized, Acute right lower quadrant abdominal tenderness. FINDINGS: CTA NECK: AORTIC ARCH AND ARCH VESSELS: No  dissection or arterial injury. No significant stenosis of the brachiocephalic or subclavian arteries. Calcific aortic atherosclerosis. CERVICAL CAROTID ARTERIES: No dissection, arterial injury, or hemodynamically significant stenosis by NASCET criteria. Mild atherosclerosis at both carotid bifurcations without hemodynamically significant stenosis. Calcific atherosclerosis of both internal carotid arteries at the skull base without hemodynamically significant stenosis. CERVICAL VERTEBRAL ARTERIES: No dissection, arterial injury, or significant stenosis. Mild atherosclerosis of the left vertebral artery V4 segment but otherwise normal vertebral arteries. LUNGS AND MEDIASTINUM: 5 mm ground-glass density nodule in the right lung apex. Small left pleural effusion. SOFT TISSUES: No acute abnormality. BONES: No acute abnormality. CTA HEAD: ANTERIOR CIRCULATION: No significant stenosis of the internal carotid arteries. No significant stenosis of the anterior cerebral arteries. No significant stenosis of the middle cerebral arteries. No aneurysm. Fetal origin of the left PCA. POSTERIOR CIRCULATION: No significant stenosis of the posterior cerebral arteries. No significant stenosis of the basilar artery. No significant stenosis of the vertebral arteries. No aneurysm. OTHER: No dural venous sinus thrombosis on this non-dedicated study. IMPRESSION: 1. No large vessel occlusion, hemodynamically significant stenosis, or aneurysm in the head or neck. 2. Mild atherosclerosis at both carotid bifurcations and left vertebral artery V4 segment without hemodynamically significant stenosis. Electronically signed by: Franky Stanford MD 02/05/2024 08:06 PM EDT RP Workstation: [id number]   CT ABDOMEN PELVIS W CONTRAST Result Date: 02/05/2024 CLINICAL DATA:  Abdominal pain, acute, nonlocalized Acute right lower quadrant abdominal tenderness. EXAM: CT ABDOMEN AND PELVIS WITH CONTRAST TECHNIQUE: Multidetector CT imaging of the abdomen and  pelvis was performed using the standard protocol following bolus administration of intravenous contrast. RADIATION DOSE REDUCTION: This exam was performed according to the departmental dose-optimization program which includes automated exposure control, adjustment of the mA and/or kV according to patient size and/or use of iterative reconstruction technique. CONTRAST:  75mL OMNIPAQUE  IOHEXOL  350 MG/ML SOLN COMPARISON:  CT abdomen pelvis 12/18/2023 FINDINGS: Lower chest: Small left and trace right pleural effusions. Left lower lobe atelectasis. Cardiomegaly. Mitral annular calcification. Aortic valve leaflet calcification. Coronary artery calcification. Cardiac leads partially visualized. Hepatobiliary: No focal liver abnormality. Status post cholecystectomy. Persistent central intrahepatic biliary dilatation and common bile duct dilatation measuring up to 11 mm. Pancreas: Diffusely atrophic. No focal lesion. Otherwise normal pancreatic contour. No surrounding inflammatory changes. No main pancreatic ductal dilatation. Spleen: Normal in size without focal abnormality. Adrenals/Urinary Tract: No adrenal nodule bilaterally. Bilateral kidneys enhance symmetrically. Subcentimeter hypodensities are too small to characterize-no further follow-up indicated. No hydronephrosis. No hydroureter. The urinary bladder is unremarkable. Stomach/Bowel: Stomach is within normal limits. No evidence of large bowel wall thickening or dilatation. Fluid dilatation of several loops of proximal jejunum measuring up to 4.2 cm in caliber with possible developing transition point noted within the right anterior mid abdomen (3:39). Associated luminal air-fluid level. Colonic diverticulosis. Appendix appears normal. Vascular/Lymphatic: CT No abdominal aorta or iliac aneurysm. Moderate atherosclerotic plaque of the aorta and its branches. No abdominal, pelvic, or  inguinal lymphadenopathy. Reproductive: Not well visualized due to streak artifact  originating from the right femoral hardware. Likely hysterectomy. Other: Nonspecific trace pelvic intraperitoneal free fluid. No intraperitoneal free gas. No organized fluid collection. Musculoskeletal: No abdominal wall hernia or abnormality. No suspicious lytic or blastic osseous lesions. No acute displaced fracture. Chronic similar-appearing L1 compression fracture. Multilevel degenerative changes of the spine. Total right hip arthroplasty. Nail fixation of the left femoral neck. IMPRESSION: 1. Developing/early or partial small-bowel obstruction of the proximal jejunum with likely developing transition point within the right anterior mid abdomen. 2. Small left and trace right pleural effusions. 3. Cholecystectomy with chronic persistent intra and extrahepatic biliary ductal dilatation. 4. Cardiomegaly. 5. Aortic Atherosclerosis (ICD10-I70.0) including coronary artery, mitral annular, aortic valve leaflet calcifications. Electronically Signed   By: Morgane  Naveau M.D.   On: 02/05/2024 18:48   DG Chest Portable 1 View Result Date: 02/05/2024 CLINICAL DATA:  Provided history: fall, abd pain, neuroogic deficits EXAM: PORTABLE CHEST 1 VIEW COMPARISON:  Chest radiograph 08/05/2023. FINDINGS: Left-sided pacemaker in place. Chronic cardiomegaly, unchanged. Stable mediastinal contours. Aortic atherosclerosis. Left pleural effusion and basilar opacity. No pneumothorax. The lungs are hyperinflated with chronic bronchial thickening. On limited assessment, no evidence of displaced fracture. The bones are diffusely under mineralized. IMPRESSION: 1. Left pleural effusion and basilar opacity. 2. Chronic cardiomegaly. 3. Chronic bronchial thickening. Electronically Signed   By: Andrea Gasman M.D.   On: 02/05/2024 15:49    Anti-infectives: Anti-infectives (From admission, onward)    None        Assessment/Plan pSBO/SBO -8-hr delay films reviewed and contrast in stomach, small bowel, and some in the right  colon, but patient with persistent distention, N/V this morning -repeat film currently. -if continues to have N/V, may need NGT. -unclear etiology of persistent tenderness despite films showing improvement.  Could still have a psbo with dilatation and pain from this -previous abdominal surgeries, appy, open chole, ovarian cystectomy, hysterectomy -will continue to follow on general surgery side.  No traumatic etiology noted as she does not think she hit her abdomen when she has fallen recently.  No bruising or signs of trauma.  FEN - NPO/IVFs per medicine VTE - none currently ID - none indicated  A fib rvr - on cardizem  and rate controlled currently, 70s when I was present Orthostatic hypotension Recenl falls CVA GERD hypothyroidism  I reviewed nursing notes, hospitalist notes, last 24 h vitals and pain scores, last 48 h intake and output, last 24 h labs and trends, and last 24 h imaging results.   LOS: 2 days    Burnard FORBES Banter , [facility] Surgery 2024/02/16, 7:59 AM Please see Amion for pager number during day hours 7:00am-4:30pm or 7:00am -11:30am on weekends

## 2024-02-18 NOTE — Progress Notes (Signed)
 Emergently called to MRI department.  Upon arrival to bedside patient is apneic and pulseless, PEA on monitor.  Patient With DNR code status.  2 RNs confirmed no heart tones.  TOD pronounced at 1330.

## 2024-02-18 NOTE — Progress Notes (Signed)
   Palliative Medicine Inpatient Follow Up Note HPI: Megan Olsen is a 89 y.o. female with hx CVA, CAD/non-STEMI, A-fib on Eliquis , SSS/PPM, HFpEF, valvular disease, HTN, HLD, hypothyroidism, chronic IDA, B12 deficiency, chronic biliary dilation, anxiety and depression, right THA. L hip ORIF, recent fall admitted from 5/31 - 6/2, who is brought to ED from [facility] after recurrent GLF.  Incidental finding of early / partial SBO, and possible stroke-like symptoms of unclear chronicity.  Palliative care requested to support additional goals of care conversations.   Today's Discussion 2024-03-04  *Please note that this is a verbal dictation therefore any spelling or grammatical errors are due to the Dragon Medical One system interpretation.  Chart reviewed inclusive of vital signs, progress notes, laboratory results, and diagnostic images.   I met with Megan Olsen this morning. She is generally uncomfortable. She and I discussed present care, her wishes are to be comfortable. She is willing to continue present measures for the time being but does not want aggressive intervention of her bowel obstruction - she does not want an NGT. If she has irreversible condition(s) she does not want life prolongation.  ____________________________________  Per discussion with RN, Megan Olsen was sent for an MRI and started to desaturate to the 70's.   I called and spoke with patient niece, Megan Olsen. We reviewed that while at MRI Megan Olsen started to decline. The decision to transition to full comfort measures was made.   We talked about transition to comfort measures in house and what that would entail inclusive of medications to control pain, dyspnea, agitation, nausea, itching, and hiccups.  I shared in Megan Olsen's case we would start a dilaudid  gtt to support her symptoms best.   We discussed stopping all uneccessary measures such as cardiac monitoring, blood draws, needle sticks, and frequent vital signs.   Megan Olsen wants  to honors Megan Olsen's wishes to pass away peacefully.   If patient stable enough to go to hospice home the preference would be in [facility].   Questions and concerns addressed/Palliative Support Provided.   SUMMARY OF RECOMMENDATIONS   DNAR/DNI   Patient acutely declined this afternoon  Transitioned to comfort care focus  Plan to start dilaudid  gtt with boluses  Diazepam  ordered for anxiety  Additional comfort medication per [facility]  Unrestricted visitation  If [place] stabilizes [facility]   Ongoing PMT support ______________________________________________________________________________________ Megan Olsen [facility] Palliative Medicine Team Team Cell Phone: [phone] Please utilize secure chat with additional questions, if there is no response within 30 minutes please call the above phone number  Time Spent: 50 Billing based on MDM: High  Palliative Medicine Team providers are available by phone from 7am to 7pm daily and can be reached through the team cell phone.  Should this patient require assistance outside of these hours, please call the patient's attending physician.

## 2024-02-18 NOTE — Plan of Care (Addendum)
 A-fib RVR-heart rate up to 190s range. Overnight patient continues to have A-fib RVR second episode. On the floor cannot give Cardizem  bolus so giving second dose of IV Lopressor  5 mg and afterward initiating Cardizem  drip. BMP showing low potassium 3.4 replating with IV KCl. Elevated TSH checking T4 level.  Transferring patient to progressive unit.

## 2024-02-18 DEATH — deceased
